# Patient Record
Sex: Male | Born: 1955 | Race: Black or African American | Hispanic: No | Marital: Single | Smoking: Current every day smoker
Health system: Southern US, Community
[De-identification: ages and names within clinical notes are randomized; demographics above are authoritative.]

## PROBLEM LIST (undated history)

## (undated) DIAGNOSIS — H269 Unspecified cataract: Secondary | ICD-10-CM

## (undated) DIAGNOSIS — F32A Depression, unspecified: Secondary | ICD-10-CM

## (undated) DIAGNOSIS — K219 Gastro-esophageal reflux disease without esophagitis: Secondary | ICD-10-CM

## (undated) DIAGNOSIS — M810 Age-related osteoporosis without current pathological fracture: Secondary | ICD-10-CM

## (undated) DIAGNOSIS — C801 Malignant (primary) neoplasm, unspecified: Secondary | ICD-10-CM

## (undated) DIAGNOSIS — Z9151 Personal history of suicidal behavior: Secondary | ICD-10-CM

## (undated) DIAGNOSIS — M1990 Unspecified osteoarthritis, unspecified site: Secondary | ICD-10-CM

## (undated) DIAGNOSIS — M792 Neuralgia and neuritis, unspecified: Secondary | ICD-10-CM

## (undated) DIAGNOSIS — I10 Essential (primary) hypertension: Secondary | ICD-10-CM

## (undated) DIAGNOSIS — Z915 Personal history of self-harm: Secondary | ICD-10-CM

## (undated) DIAGNOSIS — F329 Major depressive disorder, single episode, unspecified: Secondary | ICD-10-CM

## (undated) DIAGNOSIS — F419 Anxiety disorder, unspecified: Secondary | ICD-10-CM

## (undated) DIAGNOSIS — E7800 Pure hypercholesterolemia, unspecified: Secondary | ICD-10-CM

## (undated) HISTORY — PX: COLONOSCOPY: SHX174

## (undated) HISTORY — DX: Anxiety disorder, unspecified: F41.9

## (undated) HISTORY — PX: NEPHRECTOMY: SHX65

## (undated) HISTORY — DX: Unspecified cataract: H26.9

## (undated) HISTORY — DX: Age-related osteoporosis without current pathological fracture: M81.0

---

## 2008-05-03 ENCOUNTER — Ambulatory Visit: Payer: Self-pay | Admitting: Cardiology

## 2008-05-03 ENCOUNTER — Inpatient Hospital Stay (HOSPITAL_COMMUNITY): Admission: EM | Admit: 2008-05-03 | Discharge: 2008-05-04 | Payer: Self-pay | Admitting: Family Medicine

## 2008-06-05 DIAGNOSIS — I10 Essential (primary) hypertension: Secondary | ICD-10-CM | POA: Insufficient documentation

## 2008-06-05 DIAGNOSIS — F314 Bipolar disorder, current episode depressed, severe, without psychotic features: Secondary | ICD-10-CM | POA: Insufficient documentation

## 2008-06-05 DIAGNOSIS — R079 Chest pain, unspecified: Secondary | ICD-10-CM

## 2008-06-05 DIAGNOSIS — C649 Malignant neoplasm of unspecified kidney, except renal pelvis: Secondary | ICD-10-CM | POA: Insufficient documentation

## 2008-06-05 DIAGNOSIS — F329 Major depressive disorder, single episode, unspecified: Secondary | ICD-10-CM

## 2008-06-05 DIAGNOSIS — F3130 Bipolar disorder, current episode depressed, mild or moderate severity, unspecified: Secondary | ICD-10-CM | POA: Insufficient documentation

## 2008-06-05 DIAGNOSIS — E785 Hyperlipidemia, unspecified: Secondary | ICD-10-CM | POA: Insufficient documentation

## 2008-06-05 HISTORY — DX: Malignant neoplasm of unspecified kidney, except renal pelvis: C64.9

## 2010-05-28 LAB — POCT CARDIAC MARKERS
CKMB, poc: 1.5 ng/mL (ref 1.0–8.0)
CKMB, poc: <1 ng/mL — ABNORMAL LOW (ref 1.0–8.0)
Myoglobin, poc: 79.8 ng/mL (ref 12–200)

## 2010-05-28 LAB — COMPREHENSIVE METABOLIC PANEL
ALT: 21 U/L (ref 0–53)
AST: 22 U/L (ref 0–37)
Albumin: 3.3 g/dL — ABNORMAL LOW (ref 3.5–5.2)
Alkaline Phosphatase: 48 U/L (ref 39–117)
Calcium: 8.6 mg/dL (ref 8.4–10.5)
GFR calc Af Amer: >60 mL/min (ref >60)
Glucose, Bld: 121 mg/dL — ABNORMAL HIGH (ref 70–99)
Potassium: 3.7 mEq/L (ref 3.5–5.1)
Sodium: 136 mEq/L (ref 135–145)
Total Protein: 5.8 g/dL — ABNORMAL LOW (ref 6.0–8.3)

## 2010-05-28 LAB — CK TOTAL AND CKMB (NOT AT ARMC)
CK, MB: 1.8 ng/mL (ref 0.3–4.0)
Relative Index: 0.6 (ref 0.0–2.5)
Total CK: 302 U/L — ABNORMAL HIGH (ref 7–232)

## 2010-05-28 LAB — DIFFERENTIAL
Basophils Absolute: 0 10*3/uL (ref 0.0–0.1)
Basophils Relative: 1 % (ref 0–1)
Eosinophils Absolute: 0.1 10*3/uL (ref 0.0–0.7)
Monocytes Absolute: 0.5 10*3/uL (ref 0.1–1.0)
Neutro Abs: 3.1 10*3/uL (ref 1.7–7.7)
Neutrophils Relative %: 58 % (ref 43–77)

## 2010-05-28 LAB — TROPONIN I: Troponin I: <0.01 ng/mL (ref 0.00–0.06)

## 2010-05-28 LAB — BASIC METABOLIC PANEL
CO2: 29 mEq/L (ref 19–32)
Calcium: 8.9 mg/dL (ref 8.4–10.5)
Creatinine, Ser: 1.12 mg/dL (ref 0.4–1.5)
Glucose, Bld: 85 mg/dL (ref 70–99)

## 2010-05-28 LAB — HEPARIN LEVEL (UNFRACTIONATED): Heparin Unfractionated: 0.85 IU/mL — ABNORMAL HIGH (ref 0.30–0.70)

## 2010-05-28 LAB — CBC
MCHC: 35.3 g/dL (ref 30.0–36.0)
RDW: 14.6 % (ref 11.5–15.5)

## 2010-05-28 LAB — CARDIAC PANEL(CRET KIN+CKTOT+MB+TROPI): CK, MB: 1.8 ng/mL (ref 0.3–4.0)

## 2010-05-28 LAB — LIPID PANEL
Cholesterol: 127 mg/dL (ref 0–200)
HDL: 27 mg/dL — ABNORMAL LOW (ref >39)
Total CHOL/HDL Ratio: 4.7 RATIO
VLDL: 18 mg/dL (ref 0–40)

## 2010-06-30 NOTE — Discharge Summary (Signed)
[name], [name]            ACCOUNT NO.:  [id number]   MEDICAL RECORD NO.:  [id number]          PATIENT TYPE:  INP   LOCATION:  3712                         FACILITY:  [facility]   PHYSICIAN:  Doylene Canning. Ladona Ridgel, MD    DATE OF BIRTH:  [birth year]   DATE OF ADMISSION:  05/03/2008  DATE OF DISCHARGE:  05/04/2008                               DISCHARGE SUMMARY   PRIMARY CARDIOLOGIST:  It will be new, Madolyn Frieze. Jens Som, MD, [facility]   PRIMARY CARE PHYSICIAN:  The physicians of [facility].   PROCEDURES PERFORMED DURING HOSPITALIZATION:  None.   FINAL DISCHARGE DIAGNOSES:  1. Chest pain.  2. History of reported myocardial infarction in 2007 with cardiac      catheterization in [place] with no intervention per history and      physical.  3. History of renal carcinoma, status post left nephrectomy in 2007 in      [place].  4. Hypertension.  5. Hyperlipidemia.  6. Depression.  7. Posttraumatic stress disorder.   HOSPITAL COURSE:  This is a 55 year old African American male with prior  history of MI in 2007 with a subsequent cardiac catheterization there  with no intervention at that time.  The patient presented to [facility]  Emergency Room with complaints of sudden onset of shortness of breath  with left arm tingling and numbness as well as 9/10 substernal chest  pressure, tightness, and squeezing.  The patient states the symptoms  will worse with walking and better with rest.  As a result of this, the  patient presented to [facility] Emergency Room where he was seen and  examined by Ward Givens, nurse practitioner and Dr. Olga Millers.  The  patient was admitted to rule out myocardial infarction with cardiac  enzymes cycled, if positive the patient will be planned for cardiac  catheterization.  The patient was reluctant to stay, as he did not want  to lose the spot in the homeless shelter.  Social worker was requested  to see the patient to assist with home needs.   It was found that the  [facility]  Prior to admission, they gave  him a call and there will be a bed waiting for him when he returns.   Cardiac enzymes, as stated, were cycled and were found to be negative  x3.  The patient had no further complaints of chest discomfort during  hospitalization.  EKG revealed normal sinus rhythm without evidence of  ischemia or heart block.   The patient was seen and examined by Dr. Lewayne Bunting on day of  discharge, and the patient was anxious to leave.  The patient's blood  pressure and heart rate were stable, and the patient was found to be  able to return home.   DISCHARGE LABORATORY DATA:  Cardiac enzymes negative x3.  Troponin less  than 0.05, less than 0.05, less than 0.01 respectively.  Cholesterol  127, lipids 90, HDL 27, LDL 82.  Sodium 136, potassium 3.7, chloride  103, CO2 of 26, glucose 121, BUN 19, creatinine 1.14.  Hemoglobin A1c  7.2.  TSH 0.684.  Hemoglobin 14.0, hematocrit 39.6, white blood cells  5.4, platelets 168.  Chest x-ray revealing no acute cardiopulmonary  process.  EKG as stated revealing normal sinus rhythm, ventricular rate  of 72 beats per minute.   DISCHARGE MEDICATIONS:  1. Aspirin 81 mg daily.  2. Wellbutrin 150 mg twice a day.  3. Seroquel 300 mg at bedtime.  4. Trazodone 100 mg at bedtime.  5. Augmentin as taken prior to admission for completion of antibiotic      therapy.   FOLLOWUP PLANS AND APPOINTMENT:  The patient will follow up with Dr.  Olga Millers within 1 month, our office will call to make an  appointment or he will call our office, as phone number has been  provided.   Time spent with the patient to include physician time 30 minutes.      Bettey Mare. Lyman Bishop, NP      Doylene Canning. Ladona Ridgel, MD  Electronically Signed    KML/MEDQ  D:  05/04/2008  T:  05/05/2008  Job:  [id number]

## 2010-06-30 NOTE — H&P (Signed)
[name], [name]            ACCOUNT NO.:  [id number]   MEDICAL RECORD NO.:  [id number]          PATIENT TYPE:  INP   LOCATION:  3712                         FACILITY:  [facility]   PHYSICIAN:  Madolyn Frieze. Jens Som, MD, FACCDATE OF BIRTH:  May 31, 1955   DATE OF ADMISSION:  05/03/2008  DATE OF DISCHARGE:                              HISTORY & PHYSICAL   PRIMARY CARDIOLOGIST:  New to [facility], being seen by Dr.  Madolyn Frieze. [place].   PRIMARY CARE Greydis Stlouis:  [facility].   PATIENT PROFILE:  A 55 year old [place] American male with reported  prior history of MI who presents with unstable angina.   PROBLEMS:  1. Unstable angina.      a.     History of reported MI in 2007 with cardiac catheterization       in [place], then no intervention.  2. History of renal carcinoma status post left nephrectomy in 2007      also in [place].  3. Hypertension.  4. Hyperlipidemia.  5. Depression.  6. PTSD.   HISTORY OF PRESENT ILLNESS:  This is a 55 year old African American male  with prior history of light heart attack that was evaluated in  [place] with stress test and subsequent catheterization.  The patient  does not think that he had an intervention.  He was told that his heart  arteries were normal.  The patient is homeless and lives in a shelter  here in [place] and was walking today when he had sudden onset  shortness of breath with left arm tingling and numbness as well as 9/10  substernal chest pressure, tightness, squeezing.  Symptoms were worse  with walking and better with rest.  He had intermittent symptoms  throughout the day and eventually went back to the homeless shelter  where somewhat recommended that he get checked out.  He then walked from  a shelter with recurrent symptoms [place] way [place]  where there was a veteran's office and he was able to get a ride to the  Urgent Care here at [facility] and subsequently was taken to the  [facility]  ED.  He is currently complaining of 4/10 chest discomfort.  He did  receive nitroglycerin earlier and says it did help.   ALLERGIES:  No known drug allergies.   HOME MEDICATIONS:  1. Aspirin 81 mg daily.  2. Wellbutrin 150 mg b.i.d.  3. Seroquel 300 mg nightly.  4. Trazodone 100 mg nightly.  5. Augmentin 500/125 mg b.i.d. (started a couple of days ago for      chest cold.)   FAMILY HISTORY:  Mother is alive and well at 73.  Father is alive and  well at 39.  His brother died of following a [place] spider bite.  He has 2 sisters and another brother who are alive and well.   SOCIAL HISTORY:  He lives in homeless shelter here in [place] at  [facility].  He does not currently work.  He has a 15 pack-year  history of tobacco abuse, currently smoking half-a-pack a day.  Denies  alcohol  or drugs.   REVIEW OF SYSTEMS:  Positive chest pain, dyspnea on exertion.  He has  major depression as well as anxiety.  He recently had upper chest  congestion and cough and he has been started on Augmentin.  He is  otherwise a full code.  Otherwise, all systems reviewed were negative.   PHYSICAL EXAMINATION:  VITAL SIGNS:  Temperature 97.0, heart rate 93,  respirations 20, blood pressure 116/75, pulse ox 99% room air.  GENERAL:  A pleasant African American male in no acute distress.  Awake,  alert, and oriented x3.  HEENT:  Ears normal.  Nares grossly intact, nonfocal.  SKIN:  Warm and dry without lesions or masses.  MUSCULOSKELETAL:  Grossly normal without deformity or effusions.  PSYCHIATRIC:  Normal affect.  NECK:  No bruits, JVD.  LUNGS:  Respirations are regular and unlabored.  Clear to auscultation.  CARDIAC:  Irregularly regular.  S1 and S2.  No S3, S4, or murmurs.  ABDOMEN:  Round, soft, nontender, and nondistended.  Bowel sounds  present x4.  EXTREMITIES:  Warm, dry, and pink.  No clubbing, cyanosis, or edema.  Dorsalis pedis and posterior tibial pulses are 2+ and  equal bilaterally.   DIAGNOSTIC STUDIES:  Chest x-ray shows  no acute cardiopulmonary  process.  EKG shows sinus rhythm at rate 82, normal axis, inferior Qs,  and 1 mm J-point elevation at V2, V3.   LABORATORY DATA:  Hemoglobin 14.0, hematocrit 39.6, WBC 5.4, platelets  168.  Sodium 139, potassium 3.7, chloride 105, CO2 of 29, BUN 18,  creatinine 1.12, glucose 85.  CK MB 1.5.  Troponin-I less than 0.05.   ASSESSMENT AND PLAN:  1. Unstable angina.  The patient has had recurrent exertional angina      and dyspnea with radiation done to the left arm also associated      with left arm numbness, better with rest.  Symptoms have been      recurrent all day today.  Nitro helped here in the ED.  Notably      symptoms are similar to his previous myocardial infarction symptoms      in 2007.  The patient says that time he had a catheterization which      showed normal coronaries.  Plan to admit and cycle cardiac      markers.  We have recommend cardiac catheterization which could be      performed on Monday or sooner if the patient had recurrent      symptoms.  The patient states that he may need to leave [facility]      tomorrow as he is very concerned about losing his spot in the      homeless shelter.  We will ask social work to try and assist with      the situation.  Provided that we do and perform catheterization, we      will hydrate him well as he does have solitary kidney following his      left nephrectomy in 2007.  We will plan to add heparin, nitrate,      beta blocker, statin, and continue aspirin.  2. Hypertension, stable.  He is on no meds at home.  3. Hyperlipidemia.  Check lipids and LFTs.  We will add simvastatin.      He does not take anything at home.  4. Encouraged to continue home meds.  5. Tobacco abuse.  Cessation advised.      Nicolasa Ducking, ANP  Madolyn Frieze Jens Som, MD, [facility]  Electronically Signed    CB/MEDQ  D:  05/03/2008  T:  05/04/2008  Job:  [id number]

## 2011-02-01 ENCOUNTER — Emergency Department (HOSPITAL_COMMUNITY)
Admission: EM | Admit: 2011-02-01 | Discharge: 2011-02-01 | Disposition: A | Discharge status: Home / Self Care | Payer: Non-veteran care | Attending: Emergency Medicine | Admitting: Emergency Medicine

## 2011-02-01 ENCOUNTER — Encounter: Payer: Self-pay | Admitting: *Deleted

## 2011-02-01 ENCOUNTER — Emergency Department (HOSPITAL_COMMUNITY): Payer: Non-veteran care

## 2011-02-01 DIAGNOSIS — E119 Type 2 diabetes mellitus without complications: Secondary | ICD-10-CM | POA: Insufficient documentation

## 2011-02-01 DIAGNOSIS — R29898 Other symptoms and signs involving the musculoskeletal system: Secondary | ICD-10-CM | POA: Insufficient documentation

## 2011-02-01 DIAGNOSIS — M25561 Pain in right knee: Secondary | ICD-10-CM

## 2011-02-01 DIAGNOSIS — I10 Essential (primary) hypertension: Secondary | ICD-10-CM | POA: Insufficient documentation

## 2011-02-01 DIAGNOSIS — M25569 Pain in unspecified knee: Secondary | ICD-10-CM | POA: Insufficient documentation

## 2011-02-01 HISTORY — DX: Unspecified osteoarthritis, unspecified site: M19.90

## 2011-02-01 HISTORY — DX: Gastro-esophageal reflux disease without esophagitis: K21.9

## 2011-02-01 HISTORY — DX: Essential (primary) hypertension: I10

## 2011-02-01 HISTORY — DX: Neuralgia and neuritis, unspecified: M79.2

## 2011-02-01 HISTORY — DX: Major depressive disorder, single episode, unspecified: F32.9

## 2011-02-01 HISTORY — DX: Pure hypercholesterolemia, unspecified: E78.00

## 2011-02-01 HISTORY — DX: Depression, unspecified: F32.A

## 2011-02-01 MED ORDER — TRAMADOL HCL 50 MG PO TABS: 50.0000 mg | ORAL_TABLET | Freq: Once | ORAL | Status: DC

## 2011-02-01 MED ORDER — IBUPROFEN 400 MG PO TABS: 400.0000 mg | ORAL_TABLET | Freq: Four times a day (QID) | ORAL | Status: AC | PRN

## 2011-02-01 MED ORDER — HYDROCODONE-ACETAMINOPHEN 5-325 MG PO TABS
1.0000 | ORAL_TABLET | Freq: Once | ORAL | Status: DC
  Filled 2011-02-01: qty 1

## 2011-02-01 MED ORDER — IBUPROFEN 800 MG PO TABS
800.0000 mg | ORAL_TABLET | Freq: Once | ORAL | Status: AC
  Administered 2011-02-01: 800 mg via ORAL
  Filled 2011-02-01: qty 1

## 2011-02-01 NOTE — ED Provider Notes (Signed)
History     CSN: [id number] Arrival date & time: 02/01/2011  7:27 PM   First MD Initiated Contact with Patient 02/01/11 2107      Chief Complaint  Patient presents with  . Extremity Weakness    pt c/o right leg weakness that started at 330. states was on crutches last year for some reason. reports "knee went out." states felt a pain in knee and "it popped." then reports unable to walk on it.      Patient is a 55 y.o. male presenting with knee pain. The history is provided by the patient and the spouse.  Knee Pain This is a recurrent problem. The current episode started today. The problem occurs constantly.  Knee Pain This is a recurrent problem. The current episode started today. The problem occurs constantly.  Reports [facility] in a [facility] and today while walking he felt a pop in his (R) knee then had a sharp pain in the knee. Pain has persisted since this event at approx 1530 today. KNee is also swollen. Staff at the center recommedned he come get it checked.  Past Medical History  Diagnosis Date  . Arthritis   . Diabetes mellitus   . Hypertension   . Depression   . High cholesterol   . GERD (gastroesophageal reflux disease)   . Neuropathic pain     History reviewed. No pertinent past surgical history.  History reviewed. No pertinent family history.  History  Substance Use Topics  . Smoking status: Current Everyday Smoker    Types: Cigarettes  . Smokeless tobacco: Not on file  . Alcohol Use: No      Review of Systems  Constitutional: Negative.   HENT: Negative.   Eyes: Negative.   Respiratory: Negative.   Cardiovascular: Negative.   Gastrointestinal: Negative.   Genitourinary: Negative.   Musculoskeletal: Positive for extremity weakness.  Skin: Negative.   Neurological: Negative.   Hematological: Negative.   Psychiatric/Behavioral: Negative.     Allergies  Morphine and related  Home Medications   Current Outpatient Rx  Name Route  Sig Dispense Refill  . ARIPIPRAZOLE 10 MG PO TABS Oral Take 5 mg by mouth daily.      Marland Kitchen GABAPENTIN 100 MG PO CAPS Oral Take 100 mg by mouth at bedtime.      . GUAIFENESIN 400 MG PO TABS Oral Take 400 mg by mouth every 6 (six) hours as needed. For cough and congestion     . LANCET DEVICES MISC Does not apply by Does not apply route.      Marland Kitchen LISINOPRIL 20 MG PO TABS Oral Take 10 mg by mouth daily.      . MELOXICAM 15 MG PO TABS Oral Take 15 mg by mouth daily.      Marland Kitchen MENTHOL 3 MG MT LOZG Oral Take 1 lozenge by mouth as needed. For sore thoat     . METFORMIN HCL 500 MG PO TABS Oral Take 500 mg by mouth 2 (two) times daily with a meal.      . METOPROLOL TARTRATE 50 MG PO TABS Oral Take 25 mg by mouth 2 (two) times daily.      Marland Kitchen OMEPRAZOLE MAGNESIUM 20 MG PO TBEC Oral Take 20 mg by mouth daily before breakfast.      . POLYVINYL ALCOHOL 1.4 % OP SOLN Both Eyes Place 1 drop into both eyes as needed. For dry eyes     . SERTRALINE HCL 100 MG PO  TABS Oral Take 100 mg by mouth daily.      Marland Kitchen SIMVASTATIN 80 MG PO TABS Oral Take 40 mg by mouth at bedtime.        BP 133/83  Pulse 78  Temp(Src) 98.3 F (36.8 C) (Oral)  Resp 20  SpO2 98%  Physical Exam  Constitutional: He appears well-developed and well-nourished.  HENT:  Head: Normocephalic and atraumatic.  Eyes: Conjunctivae are normal.  Neck: Neck supple.  Cardiovascular: Normal rate and regular rhythm.   Pulmonary/Chest: Effort normal and breath sounds normal.  Abdominal: Soft. Bowel sounds are normal.  Musculoskeletal: Normal range of motion.       Right knee: He exhibits swelling and effusion. He exhibits no erythema.       Legs: Neurological: He is alert.  Skin: Skin is warm and dry.  Psychiatric: He has a normal mood and affect.    ED Course  Procedures Findings and plan discussed w/ pt. Will apply knee immobilizer. States he thinks he can get around ok w/ his cane and declines crutches. Pt agreeable w/ plan  Labs Reviewed - No data  to display No results found.   No diagnosis found.    MDM  Knee pain. No acute injury identified. Swelling noted.   Medical screening examination/treatment/procedure(s) were performed by non-physician practitioner and as supervising physician I was immediately available for consultation/collaboration. Osvaldo Human, M.D.     Leanne Chang, NP 02/01/11 2329  Roma Kayser Schorr, NP 02/01/11 4098  Carleene Cooper III, MD 02/02/11 1058

## 2011-06-16 ENCOUNTER — Encounter (HOSPITAL_COMMUNITY): Payer: Self-pay | Admitting: Family Medicine

## 2011-06-16 ENCOUNTER — Emergency Department (HOSPITAL_COMMUNITY)
Admission: EM | Admit: 2011-06-16 | Discharge: 2011-06-16 | Disposition: A | Discharge status: Home / Self Care | Payer: Medicaid Other | Attending: Emergency Medicine | Admitting: Emergency Medicine

## 2011-06-16 DIAGNOSIS — R209 Unspecified disturbances of skin sensation: Secondary | ICD-10-CM | POA: Insufficient documentation

## 2011-06-16 DIAGNOSIS — R300 Dysuria: Secondary | ICD-10-CM | POA: Insufficient documentation

## 2011-06-16 DIAGNOSIS — E119 Type 2 diabetes mellitus without complications: Secondary | ICD-10-CM | POA: Insufficient documentation

## 2011-06-16 DIAGNOSIS — I10 Essential (primary) hypertension: Secondary | ICD-10-CM | POA: Insufficient documentation

## 2011-06-16 DIAGNOSIS — M79609 Pain in unspecified limb: Secondary | ICD-10-CM | POA: Insufficient documentation

## 2011-06-16 DIAGNOSIS — F17200 Nicotine dependence, unspecified, uncomplicated: Secondary | ICD-10-CM | POA: Insufficient documentation

## 2011-06-16 DIAGNOSIS — R110 Nausea: Secondary | ICD-10-CM | POA: Insufficient documentation

## 2011-06-16 DIAGNOSIS — N419 Inflammatory disease of prostate, unspecified: Secondary | ICD-10-CM | POA: Insufficient documentation

## 2011-06-16 DIAGNOSIS — E7800 Pure hypercholesterolemia, unspecified: Secondary | ICD-10-CM | POA: Insufficient documentation

## 2011-06-16 DIAGNOSIS — K219 Gastro-esophageal reflux disease without esophagitis: Secondary | ICD-10-CM | POA: Insufficient documentation

## 2011-06-16 DIAGNOSIS — R079 Chest pain, unspecified: Secondary | ICD-10-CM | POA: Insufficient documentation

## 2011-06-16 LAB — OCCULT BLOOD, POC DEVICE: Fecal Occult Bld: NEGATIVE

## 2011-06-16 LAB — URINALYSIS, ROUTINE W REFLEX MICROSCOPIC
Bilirubin Urine: NEGATIVE
Glucose, UA: NEGATIVE mg/dL
Ketones, ur: NEGATIVE mg/dL
pH: 5.5 (ref 5.0–8.0)

## 2011-06-16 LAB — DIFFERENTIAL
Eosinophils Absolute: 0 10*3/uL (ref 0.0–0.7)
Lymphs Abs: 1.6 10*3/uL (ref 0.7–4.0)
Monocytes Relative: 8 % (ref 3–12)
Neutrophils Relative %: 73 % (ref 43–77)

## 2011-06-16 LAB — CBC
Hemoglobin: 15 g/dL (ref 13.0–17.0)
MCH: 28.3 pg (ref 26.0–34.0)
MCV: 83.4 fL (ref 78.0–100.0)
RBC: 5.3 MIL/uL (ref 4.22–5.81)

## 2011-06-16 LAB — GLUCOSE, CAPILLARY

## 2011-06-16 LAB — URINE MICROSCOPIC-ADD ON

## 2011-06-16 LAB — BASIC METABOLIC PANEL
BUN: 17 mg/dL (ref 6–23)
Calcium: 9.4 mg/dL (ref 8.4–10.5)
Creatinine, Ser: 1.1 mg/dL (ref 0.50–1.35)
GFR calc Af Amer: 86 mL/min — ABNORMAL LOW (ref >90)
GFR calc non Af Amer: 74 mL/min — ABNORMAL LOW (ref >90)
Potassium: 3.6 mEq/L (ref 3.5–5.1)

## 2011-06-16 MED ORDER — SULFAMETHOXAZOLE-TRIMETHOPRIM 800-160 MG PO TABS: 1.0000 | ORAL_TABLET | Freq: Two times a day (BID) | ORAL | Status: AC

## 2011-06-16 MED ORDER — SULFAMETHOXAZOLE-TMP DS 800-160 MG PO TABS
1.0000 | ORAL_TABLET | Freq: Once | ORAL | Status: AC
  Administered 2011-06-16: 1 via ORAL
  Filled 2011-06-16: qty 1

## 2011-06-16 MED ORDER — TRAMADOL HCL 50 MG PO TABS: 50.0000 mg | ORAL_TABLET | Freq: Four times a day (QID) | ORAL | Status: AC | PRN

## 2011-06-16 MED ORDER — TRAMADOL HCL 50 MG PO TABS
50.0000 mg | ORAL_TABLET | Freq: Once | ORAL | Status: AC
  Administered 2011-06-16: 50 mg via ORAL
  Filled 2011-06-16: qty 1

## 2011-06-16 NOTE — ED Notes (Signed)
sts dysuria and urinary retention. sts hurts when he walks. Denies hematuria

## 2011-06-16 NOTE — Discharge Instructions (Signed)
FOLLOW UP WITH THE [facility] FOR RECHECK IN ONE WEEK. TAKE MEDICATIONS AS PRESCRIBED. RETURN HERE WITH ANY WORSENING SYMPTOMS OR NEW CONCERNS. PUSH FLUIDS.   Prostatitis Prostatitis is an infection or injury of the prostate gland. This gland is located at the base of the bladder. Prostatitis is usually caused by the same bacteria that cause kidney and bladder infections. It can also be due to bacteria picked up by sexual contact. Symptoms of this condition include: chills, fever, low back or groin pain. There may also be an urge to urinate frequently with difficulty emptying the bladder. Treatment of acute prostatitis includes:  Antibiotic drugs for 2 to 4 weeks. Pain medicine may also be needed.   Rest for 2 to 4 days and try warm sitz baths several times daily.   Increased oral fluids. Avoid coffee, alcohol, and spicy foods which irritate the urethra.  Prostatitis can become a chronic infection if it is not treated properly. You should avoid all strenuous activity or long car rides until all your symptoms are better. Please see your caregiver as suggested for follow up care. Follow-up after treatment for prostatitis is important to be sure that the infection is completely gone. SEEK IMMEDIATE MEDICAL CARE IF: You develop shaking chills, a fever greater than 102 F (38.9 C), blood in the urine or urinary blockage. Document Released: 03/11/2004 Document Revised: 01/21/2011 Document Reviewed: 08/20/2008 [facility] Patient Information 2012 [place], [place].

## 2011-06-16 NOTE — ED Notes (Signed)
Pt now complaining of left arm numbness and HR is elevated. EKG being performed in stretcher triage

## 2011-06-16 NOTE — ED Notes (Signed)
Pt reports going to his PCP at the [facility] yesterday.  Reports having UTI symptoms that started yesterday 2 hours after leaving the [place].  Reports penile discharge (hx of STDs).  Also reports having dysuria and lower abdominal pain.  Pt also reporting (L) arm numbness and tingling.  Pt had an xray yesterday and has been referred to physical therapy.  States that he was supposed to have 2 cardiac stents placed in 2010 but refused treatment and was questioning if his blockages could be better without the stents.    Pt came to the ED today for eval of possible UTI.

## 2011-06-16 NOTE — ED Notes (Signed)
Pt reports having chest pain and left arm numbness, stated he was walking into the ED when it started. Pt stated the chest pain stopped once he sat down and denies current chest pain. Pt reports that the left arm numbness has been on going for 3 weeks. Pt stated [facility] and reported the same symptoms.

## 2011-06-16 NOTE — ED Provider Notes (Signed)
History     CSN: [id number]  Arrival date & time 06/16/11  4696   First MD Initiated Contact with Patient 06/16/11 [phone]      Chief Complaint  Patient presents with  . Urinary Tract Infection    (Consider location/radiation/quality/duration/timing/severity/associated sxs/prior treatment) Patient is a 56 y.o. male presenting with urinary tract infection and extremity pain. The history is provided by the patient.  Urinary Tract Infection This is a new problem. The current episode started in the past 7 days. The problem occurs constantly. The problem has been gradually worsening. Associated symptoms include chest pain, nausea and numbness. Pertinent negatives include no abdominal pain, coughing, fever or vomiting. Associated symptoms comments: He complains of painful urination and difficulty starting a stream of urination. No hematuria seen. No fever. He has had mild nausea without vomiting. He denies recent sexual activity. .  Extremity Pain This is a chronic problem. The current episode started more than 1 month ago. The problem occurs constantly. The problem has been unchanged. Associated symptoms include chest pain, nausea and numbness. Pertinent negatives include no abdominal pain, coughing, fever or vomiting. Associated symptoms comments: Left arm numbness and pain. He reports this is chronic and being evaluated by his doctor. He states that he has experienced on and off chest discomfort - having no pain currently. No SOB, cough or fever. He states that he was told he needed a catheterization in 2010 but declined that procedure. .    Past Medical History  Diagnosis Date  . Arthritis   . Diabetes mellitus   . Hypertension   . Depression   . High cholesterol   . GERD (gastroesophageal reflux disease)   . Neuropathic pain     History reviewed. No pertinent past surgical history.  History reviewed. No pertinent family history.  History  Substance Use Topics  . Smoking status:  Current Everyday Smoker    Types: Cigarettes  . Smokeless tobacco: Not on file  . Alcohol Use: No      Review of Systems  Constitutional: Negative.  Negative for fever and unexpected weight change.  Respiratory: Negative.  Negative for cough and shortness of breath.   Cardiovascular: Positive for chest pain. Negative for leg swelling.  Gastrointestinal: Positive for nausea. Negative for vomiting and abdominal pain.  Genitourinary: Positive for dysuria.  Musculoskeletal:       See HPI.  Neurological: Positive for numbness.    Allergies  Morphine and related  Home Medications   Current Outpatient Rx  Name Route Sig Dispense Refill  . GABAPENTIN 100 MG PO CAPS Oral Take 100 mg by mouth at bedtime.      . GUAIFENESIN 400 MG PO TABS Oral Take 400 mg by mouth every 6 (six) hours as needed. For cough and congestion     . LISINOPRIL 20 MG PO TABS Oral Take 10 mg by mouth daily.      . MELOXICAM 15 MG PO TABS Oral Take 15 mg by mouth daily.      Marland Kitchen MENTHOL 3 MG MT LOZG Oral Take 1 lozenge by mouth as needed. For sore thoat     . METFORMIN HCL 500 MG PO TABS Oral Take 500 mg by mouth 2 (two) times daily with a meal. Lunch and dinner    . METOPROLOL TARTRATE 50 MG PO TABS Oral Take 25 mg by mouth 2 (two) times daily.      Marland Kitchen MIRTAZAPINE 45 MG PO TABS Oral Take 45 mg by mouth at bedtime.    Marland Kitchen  OMEPRAZOLE MAGNESIUM 20 MG PO TBEC Oral Take 20 mg by mouth daily before breakfast.      . POLYVINYL ALCOHOL 1.4 % OP SOLN Both Eyes Place 1 drop into both eyes as needed. For dry eyes     . SIMVASTATIN 80 MG PO TABS Oral Take 40 mg by mouth at bedtime.      Marland Kitchen LANCET DEVICES MISC Does not apply by Does not apply route.        BP 122/75  Pulse 104  Temp(Src) 98.5 F (36.9 C) (Oral)  Resp 19  SpO2 97%  Physical Exam  Constitutional: He appears well-developed and well-nourished.  HENT:  Head: Normocephalic.  Neck: Normal range of motion. Neck supple.  Cardiovascular: Normal rate and regular  rhythm.   No murmur heard. Pulmonary/Chest: Effort normal and breath sounds normal. He has no wheezes. He has no rales.  Abdominal: Soft. Bowel sounds are normal. There is no tenderness. There is no rebound and no guarding.  Genitourinary:       Uncircumcised penis without discharge. Testicles without swelling, mildly and generally tender. No inguinal adenopathy. Prostate slightly enlarged and tender.   Musculoskeletal: Normal range of motion.  Neurological: He is alert. No cranial nerve deficit.  Skin: Skin is warm and dry. No rash noted.  Psychiatric: He has a normal mood and affect.    ED Course  Procedures (including critical care time)  Labs Reviewed  URINALYSIS, ROUTINE W REFLEX MICROSCOPIC - Abnormal; Notable for the following:    APPearance CLOUDY (*)    Hgb urine dipstick LARGE (*)    Protein, ur 100 (*)    Nitrite POSITIVE (*)    Leukocytes, UA LARGE (*)    All other components within normal limits  BASIC METABOLIC PANEL - Abnormal; Notable for the following:    Glucose, Bld 141 (*)    GFR calc non Af Amer 74 (*)    GFR calc Af Amer 86 (*)    All other components within normal limits  URINE MICROSCOPIC-ADD ON - Abnormal; Notable for the following:    Squamous Epithelial / LPF FEW (*)    Bacteria, UA MANY (*)    All other components within normal limits  GLUCOSE, CAPILLARY - Abnormal; Notable for the following:    Glucose-Capillary 138 (*)    All other components within normal limits  TROPONIN I  CBC  DIFFERENTIAL  OCCULT BLOOD, POC DEVICE  URINE CULTURE  GC/CHLAMYDIA PROBE AMP, GENITAL  OCCULT BLOOD X 1 CARD TO LAB, STOOL   No results found.   No diagnosis found. 1. Prostatitis 2. Chronic extremity pain    MDM  He remains comfortable with exception of c/o continued genitalia discomfort, especially with urination. UTI evident on lab studies. Patient refuses GC swab. Will treat for UTI/prostatitis with septra DS, started here. Given Ultram for discomfort.  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 Will discharge home.  Re: chest discomfort, EKG and troponin negative. Arm numbness/pain chronic and constant. Doubt cardiac origin of symptoms. [facility] [place] for recheck and further outpatient management.        Rodena Medin, PA-C 06/16/11 1314  Rodena Medin, PA-C 06/16/11 1321

## 2011-06-16 NOTE — ED Provider Notes (Signed)
Medical screening examination/treatment/procedure(s) were performed by non-physician practitioner and as supervising physician I was immediately available for consultation/collaboration.   Glynn Octave, MD 06/16/11 1739

## 2011-06-17 ENCOUNTER — Encounter (HOSPITAL_COMMUNITY): Payer: Self-pay | Admitting: Emergency Medicine

## 2011-06-17 ENCOUNTER — Emergency Department (HOSPITAL_COMMUNITY): Payer: Medicaid Other

## 2011-06-17 ENCOUNTER — Emergency Department (HOSPITAL_COMMUNITY)
Admission: EM | Admit: 2011-06-17 | Discharge: 2011-06-17 | Disposition: A | Discharge status: Home / Self Care | Payer: Medicaid Other | Attending: Emergency Medicine | Admitting: Emergency Medicine

## 2011-06-17 DIAGNOSIS — E119 Type 2 diabetes mellitus without complications: Secondary | ICD-10-CM | POA: Insufficient documentation

## 2011-06-17 DIAGNOSIS — R079 Chest pain, unspecified: Secondary | ICD-10-CM | POA: Insufficient documentation

## 2011-06-17 DIAGNOSIS — Z79899 Other long term (current) drug therapy: Secondary | ICD-10-CM | POA: Insufficient documentation

## 2011-06-17 DIAGNOSIS — I10 Essential (primary) hypertension: Secondary | ICD-10-CM | POA: Insufficient documentation

## 2011-06-17 DIAGNOSIS — R209 Unspecified disturbances of skin sensation: Secondary | ICD-10-CM | POA: Insufficient documentation

## 2011-06-17 LAB — COMPREHENSIVE METABOLIC PANEL WITH GFR
ALT: 20 U/L (ref 0–53)
AST: 21 U/L (ref 0–37)
Albumin: 3.6 g/dL (ref 3.5–5.2)
Alkaline Phosphatase: 62 U/L (ref 39–117)
BUN: 18 mg/dL (ref 6–23)
CO2: 22 meq/L (ref 19–32)
Calcium: 8.6 mg/dL (ref 8.4–10.5)
Chloride: 102 meq/L (ref 96–112)
Creatinine, Ser: 1.07 mg/dL (ref 0.50–1.35)
GFR calc Af Amer: 89 mL/min — ABNORMAL LOW
GFR calc non Af Amer: 77 mL/min — ABNORMAL LOW
Glucose, Bld: 256 mg/dL — ABNORMAL HIGH (ref 70–99)
Potassium: 4.1 meq/L (ref 3.5–5.1)
Sodium: 135 meq/L (ref 135–145)
Total Bilirubin: 0.3 mg/dL (ref 0.3–1.2)
Total Protein: 6.9 g/dL (ref 6.0–8.3)

## 2011-06-17 LAB — CBC
HCT: 40.5 % (ref 39.0–52.0)
Hemoglobin: 13.7 g/dL (ref 13.0–17.0)
MCH: 28.1 pg (ref 26.0–34.0)
MCHC: 33.8 g/dL (ref 30.0–36.0)
MCV: 83 fL (ref 78.0–100.0)
Platelets: 186 10*3/uL (ref 150–400)
RBC: 4.88 MIL/uL (ref 4.22–5.81)
RDW: 14 % (ref 11.5–15.5)
WBC: 6.6 10*3/uL (ref 4.0–10.5)

## 2011-06-17 LAB — POCT I-STAT TROPONIN I: Troponin i, poc: 0 ng/mL (ref 0.00–0.08)

## 2011-06-17 LAB — APTT: aPTT: 29 s (ref 24–37)

## 2011-06-17 MED ORDER — SODIUM CHLORIDE 0.9 % IV SOLN
1000.0000 mL | INTRAVENOUS | Status: DC
  Administered 2011-06-17: 1000 mL via INTRAVENOUS

## 2011-06-17 MED ORDER — ASPIRIN 81 MG PO CHEW
324.0000 mg | CHEWABLE_TABLET | Freq: Once | ORAL | Status: AC
  Administered 2011-06-17: 324 mg via ORAL
  Filled 2011-06-17: qty 4

## 2011-06-17 MED ORDER — ASPIRIN 81 MG PO CHEW: 324.0000 mg | CHEWABLE_TABLET | Freq: Every day | ORAL | Status: DC

## 2011-06-17 NOTE — ED Notes (Signed)
Pt co chest pain beginning today, pt states chest pain is in center of his chest and pain is described as sharp, pt has no nausea no shortness of breath. Pt co some dizziness.

## 2011-06-17 NOTE — ED Provider Notes (Signed)
History     CSN: [id number]  Arrival date & time 06/17/11  1243   First MD Initiated Contact with Patient 06/17/11 1256      Chief Complaint  Patient presents with  . Chest Pain    HPI Pt states he had some numbness in his left arm, as well as his hand locking up on him a few times.  Pt states he has also had some episodes of pain in his arm.    Pt has been having this off and on for at least the last month.  Pt was seen in the ED yesterday for similar symptoms.  Pt states he felt like his arm was cramping this am into a claw and he had to use his other hand to relax it.  [facility] and was told that it could be a nerve impingement.  He is scheduled for a stress test of the heart and physical therapy.  Pt states the pain in his chest was on the right side.  It did not radiate.  It was a sharp pain that lasted a few seconds.  No trouble with speech or balance.  Pt states the place that he stays at was worried and they wanted to make sure he did not have a stroke or heart attack. Past Medical History  Diagnosis Date  . Arthritis   . Diabetes mellitus   . Hypertension   . Depression   . High cholesterol   . GERD (gastroesophageal reflux disease)   . Neuropathic pain     History reviewed. No pertinent past surgical history.  No family history on file.  History  Substance Use Topics  . Smoking status: Current Everyday Smoker    Types: Cigarettes  . Smokeless tobacco: Not on file  . Alcohol Use: No      Review of Systems  All other systems reviewed and are negative.    Allergies  Morphine and related  Home Medications   Current Outpatient Rx  Name Route Sig Dispense Refill  . ACETAMINOPHEN 325 MG PO TABS Oral Take 650 mg by mouth every 6 (six) hours as needed.    Marland Kitchen GABAPENTIN 100 MG PO CAPS Oral Take 100 mg by mouth at bedtime.      . GUAIFENESIN 400 MG PO TABS Oral Take 400 mg by mouth every 6 (six) hours as needed. For cough and congestion     .  LISINOPRIL 20 MG PO TABS Oral Take 10 mg by mouth daily.      . MELOXICAM 15 MG PO TABS Oral Take 15 mg by mouth daily.      Marland Kitchen MENTHOL 3 MG MT LOZG Oral Take 1 lozenge by mouth as needed. For sore thoat     . METFORMIN HCL 500 MG PO TABS Oral Take 500 mg by mouth 2 (two) times daily with a meal. Lunch and dinner    . METOPROLOL TARTRATE 50 MG PO TABS Oral Take 25 mg by mouth 2 (two) times daily.      Marland Kitchen MIRTAZAPINE 45 MG PO TABS Oral Take 45 mg by mouth at bedtime.    . OMEPRAZOLE MAGNESIUM 20 MG PO TBEC Oral Take 20 mg by mouth daily before breakfast.      . POLYVINYL ALCOHOL 1.4 % OP SOLN Both Eyes Place 1 drop into both eyes as needed. For dry eyes     . SIMVASTATIN 80 MG PO TABS Oral Take 40 mg by mouth at  bedtime.      . SULFAMETHOXAZOLE-TRIMETHOPRIM 800-160 MG PO TABS Oral Take 1 tablet by mouth every 12 (twelve) hours. 28 tablet 0  . TRAMADOL HCL 50 MG PO TABS Oral Take 1 tablet (50 mg total) by mouth every 6 (six) hours as needed for pain. 15 tablet 0  . LANCET DEVICES MISC Does not apply by Does not apply route.        BP 139/88  Pulse 102  Temp(Src) 97.6 F (36.4 C) (Oral)  Resp 26  SpO2 99%  Physical Exam  Nursing note and vitals reviewed. Constitutional: He is oriented to person, place, and time. He appears well-developed and well-nourished. No distress.  HENT:  Head: Normocephalic and atraumatic.  Right Ear: External ear normal.  Left Ear: External ear normal.  Mouth/Throat: Oropharynx is clear and moist.  Eyes: Conjunctivae are normal. Right eye exhibits no discharge. Left eye exhibits no discharge. No scleral icterus.  Neck: Neck supple. No tracheal deviation present.  Cardiovascular: Normal rate, regular rhythm and intact distal pulses.   Pulmonary/Chest: Effort normal and breath sounds normal. No stridor. No respiratory distress. He has no wheezes. He has no rales.  Abdominal: Soft. Bowel sounds are normal. He exhibits no distension. There is no tenderness. There is  no rebound and no guarding.  Musculoskeletal: He exhibits no edema and no tenderness.  Neurological: He is alert and oriented to person, place, and time. He has normal strength. No cranial nerve deficit ( no gross defecits noted) or sensory deficit. He exhibits normal muscle tone. He displays no seizure activity. Coordination normal.       No pronator drift bilateral upper extrem, able to hold both legs off bed for 5 seconds, sensation intact in all extremities, no visual field cuts, no left or right sided neglect  Skin: Skin is warm and dry. No rash noted.  Psychiatric: He has a normal mood and affect.    ED Course  Procedures (including critical care time)  Rate: 101  Rhythm: sinus tachycardia  QRS Axis: normal  Intervals: normal  ST/T Wave abnormalities: normal  Conduction Disutrbances:none  Narrative Interpretation: Left atrial abnormality, inferior Q waves  Old EKG Reviewed: Unchanged  Labs Reviewed  COMPREHENSIVE METABOLIC PANEL - Abnormal; Notable for the following:    Glucose, Bld 256 (*)    GFR calc non Af Amer 77 (*)    GFR calc Af Amer 89 (*)    All other components within normal limits  CBC  POCT I-STAT TROPONIN I  PROTIME-INR  APTT   Dg Chest 2 View  06/17/2011  *RADIOLOGY REPORT*  Clinical Data: Chest pain  CHEST - 2 VIEW  Comparison: 05/03/2008  Findings: Lungs are clear. No pleural effusion or pneumothorax.  Cardiomediastinal silhouette is within normal limits.  Mild degenerative changes of the visualized thoracolumbar spine.  IMPRESSION: No evidence of acute cardiopulmonary disease.  Original Report Authenticated By: Charline Bills, M.D.   Ct Head Wo Contrast  06/17/2011  *RADIOLOGY REPORT*  Clinical Data: Arm numbness  CT HEAD WITHOUT CONTRAST  Technique:  Contiguous axial images were obtained from the base of the skull through the vertex without contrast.  Comparison: None  Findings: The brain has a normal appearance without evidence for hemorrhage, infarction,  hydrocephalus, or mass lesion.  There is no extra axial fluid collection.  The skull and paranasal sinuses are normal.  IMPRESSION: No acute intracranial abnormalities.  Original Report Authenticated By: Rosealee Albee, M.D.      MDM  The  patient presents with a typical chest pain. I doubt these symptoms are related to a cardiac etiology. They have been ongoing for over a week, he has no EKG changes, and his troponin is normal. Patient is comfortable here in emergent arm and. Doubt pulmonary embolism or pneumonia. His cramping in his left hand sounds to be musculoskeletal in nature and I doubt there is any sign of acute stroke.  The patient has plans for an outpatient stress test as ordered by his primary Dr. I feel that it is reasonable for him to continue with this outpatient plan to        Celene Kras, MD 06/17/11 1516

## 2011-06-17 NOTE — Discharge Instructions (Signed)
Chest Pain (Nonspecific) It is often hard to give a specific diagnosis for the cause of chest pain. There is always a chance that your pain could be related to something serious, such as a heart attack or a blood clot in the lungs. You need to follow up with your caregiver for further evaluation. CAUSES   Heartburn.   Pneumonia or bronchitis.   Anxiety or stress.   Inflammation around your heart (pericarditis) or lung (pleuritis or pleurisy).   A blood clot in the lung.   A collapsed lung (pneumothorax). It can develop suddenly on its own (spontaneous pneumothorax) or from injury (trauma) to the chest.   Shingles infection (herpes zoster virus).  The chest wall is composed of bones, muscles, and cartilage. Any of these can be the source of the pain.  The bones can be bruised by injury.   The muscles or cartilage can be strained by coughing or overwork.   The cartilage can be affected by inflammation and become sore (costochondritis).  DIAGNOSIS  Lab tests or other studies, such as X-rays, electrocardiography, stress testing, or cardiac imaging, may be needed to find the cause of your pain.  TREATMENT   Treatment depends on what may be causing your chest pain. Treatment may include:   Acid blockers for heartburn.   Anti-inflammatory medicine.   Pain medicine for inflammatory conditions.   Antibiotics if an infection is present.   You may be advised to change lifestyle habits. This includes stopping smoking and avoiding alcohol, caffeine, and chocolate.   You may be advised to keep your head raised (elevated) when sleeping. This reduces the chance of acid going backward from your stomach into your esophagus.   Most of the time, nonspecific chest pain will improve within 2 to 3 days with rest and mild pain medicine.  HOME CARE INSTRUCTIONS   If antibiotics were prescribed, take your antibiotics as directed. Finish them even if you start to feel better.   For the next few  days, avoid physical activities that bring on chest pain. Continue physical activities as directed.   Do not smoke.   Avoid drinking alcohol.   Only take over-the-counter or prescription medicine for pain, discomfort, or fever as directed by your caregiver.   Follow your caregiver's suggestions for further testing if your chest pain does not go away.   Keep any follow-up appointments you made. If you do not go to an appointment, you could develop lasting (chronic) problems with pain. If there is any problem keeping an appointment, you must call to reschedule.  SEEK MEDICAL CARE IF:   You think you are having problems from the medicine you are taking. Read your medicine instructions carefully.   Your chest pain does not go away, even after treatment.   You develop a rash with blisters on your chest.  SEEK IMMEDIATE MEDICAL CARE IF:   You have increased chest pain or pain that spreads to your arm, neck, jaw, back, or abdomen.   You develop shortness of breath, an increasing cough, or you are coughing up blood.   You have severe back or abdominal pain, feel nauseous, or vomit.   You develop severe weakness, fainting, or chills.   You have a fever.  THIS IS AN EMERGENCY. Do not wait to see if the pain will go away. Get medical help at once. Call your local emergency services (911 in [place] Do not drive yourself to the hospital. MAKE SURE YOU:   Understand these instructions.     Will watch your condition.   Will get help right away if you are not doing well or get worse.  Document Released: 11/11/2004 Document Revised: 01/21/2011 Document Reviewed: 09/07/2007 [employer] Patient Information 2012 [employer]. 

## 2011-06-18 LAB — URINE CULTURE

## 2011-06-19 NOTE — ED Notes (Signed)
+  Urine. Patient treated with Septra. Sensitive to same. Per protocol MD. °

## 2011-08-16 ENCOUNTER — Emergency Department (HOSPITAL_COMMUNITY)
Admission: EM | Admit: 2011-08-16 | Discharge: 2011-08-18 | Disposition: A | Discharge status: Other Acute Inpatient Hospital | Payer: Medicaid Other | Attending: Emergency Medicine | Admitting: Emergency Medicine

## 2011-08-16 ENCOUNTER — Encounter (HOSPITAL_COMMUNITY): Payer: Self-pay | Admitting: Emergency Medicine

## 2011-08-16 DIAGNOSIS — F17200 Nicotine dependence, unspecified, uncomplicated: Secondary | ICD-10-CM | POA: Insufficient documentation

## 2011-08-16 DIAGNOSIS — E119 Type 2 diabetes mellitus without complications: Secondary | ICD-10-CM | POA: Insufficient documentation

## 2011-08-16 DIAGNOSIS — E7800 Pure hypercholesterolemia, unspecified: Secondary | ICD-10-CM | POA: Insufficient documentation

## 2011-08-16 DIAGNOSIS — K219 Gastro-esophageal reflux disease without esophagitis: Secondary | ICD-10-CM | POA: Insufficient documentation

## 2011-08-16 DIAGNOSIS — I10 Essential (primary) hypertension: Secondary | ICD-10-CM | POA: Insufficient documentation

## 2011-08-16 DIAGNOSIS — R45851 Suicidal ideations: Secondary | ICD-10-CM | POA: Insufficient documentation

## 2011-08-16 DIAGNOSIS — R739 Hyperglycemia, unspecified: Secondary | ICD-10-CM

## 2011-08-16 DIAGNOSIS — M129 Arthropathy, unspecified: Secondary | ICD-10-CM | POA: Insufficient documentation

## 2011-08-16 LAB — COMPREHENSIVE METABOLIC PANEL
BUN: 18 mg/dL (ref 6–23)
CO2: 21 mEq/L (ref 19–32)
Chloride: 102 mEq/L (ref 96–112)
Creatinine, Ser: 1.11 mg/dL (ref 0.50–1.35)
GFR calc non Af Amer: 74 mL/min — ABNORMAL LOW (ref >90)
Glucose, Bld: 457 mg/dL — ABNORMAL HIGH (ref 70–99)
Total Bilirubin: 0.4 mg/dL (ref 0.3–1.2)

## 2011-08-16 LAB — GLUCOSE, CAPILLARY
Glucose-Capillary: 397 mg/dL — ABNORMAL HIGH (ref 70–99)
Glucose-Capillary: 183 mg/dL — ABNORMAL HIGH (ref 70–99)

## 2011-08-16 LAB — CBC WITH DIFFERENTIAL/PLATELET
HCT: 46 % (ref 39.0–52.0)
Hemoglobin: 15.7 g/dL (ref 13.0–17.0)
Lymphocytes Relative: 26 % (ref 12–46)
MCHC: 34.1 g/dL (ref 30.0–36.0)
Monocytes Absolute: 0.4 10*3/uL (ref 0.1–1.0)
Monocytes Relative: 6 % (ref 3–12)
Neutro Abs: 3.8 10*3/uL (ref 1.7–7.7)
WBC: 5.7 10*3/uL (ref 4.0–10.5)

## 2011-08-16 LAB — POCT I-STAT TROPONIN I: Troponin i, poc: 0 ng/mL (ref 0.00–0.08)

## 2011-08-16 LAB — ETHANOL: Alcohol, Ethyl (B): <11 mg/dL (ref 0–11)

## 2011-08-16 LAB — RAPID URINE DRUG SCREEN, HOSP PERFORMED: Opiates: NOT DETECTED

## 2011-08-16 MED ORDER — ADULT MULTIVITAMIN W/MINERALS CH
1.0000 | ORAL_TABLET | Freq: Every day | ORAL | Status: DC
  Administered 2011-08-16 – 2011-08-18 (×3): 1 via ORAL
  Filled 2011-08-16 (×3): qty 1

## 2011-08-16 MED ORDER — OMEPRAZOLE MAGNESIUM 20 MG PO TBEC
20.0000 mg | DELAYED_RELEASE_TABLET | Freq: Every day | ORAL | Status: DC
  Administered 2011-08-17 – 2011-08-18 (×2): 20 mg via ORAL
  Filled 2011-08-16 (×2): qty 1

## 2011-08-16 MED ORDER — MIRTAZAPINE 45 MG PO TABS
45.0000 mg | ORAL_TABLET | Freq: Every day | ORAL | Status: DC
  Administered 2011-08-16 – 2011-08-17 (×2): 45 mg via ORAL
  Filled 2011-08-16 (×2): qty 1

## 2011-08-16 MED ORDER — ATORVASTATIN CALCIUM 40 MG PO TABS
40.0000 mg | ORAL_TABLET | Freq: Every day | ORAL | Status: DC
  Administered 2011-08-17: 40 mg via ORAL
  Filled 2011-08-16: qty 1

## 2011-08-16 MED ORDER — ONDANSETRON HCL 8 MG PO TABS: 4.0000 mg | ORAL_TABLET | Freq: Three times a day (TID) | ORAL | Status: DC | PRN

## 2011-08-16 MED ORDER — METFORMIN HCL 500 MG PO TABS
500.0000 mg | ORAL_TABLET | Freq: Two times a day (BID) | ORAL | Status: DC
  Administered 2011-08-16 – 2011-08-18 (×5): 500 mg via ORAL
  Filled 2011-08-16 (×5): qty 1

## 2011-08-16 MED ORDER — LISINOPRIL 10 MG PO TABS
10.0000 mg | ORAL_TABLET | Freq: Every day | ORAL | Status: DC
  Administered 2011-08-16 – 2011-08-18 (×3): 10 mg via ORAL
  Filled 2011-08-16 (×3): qty 1

## 2011-08-16 MED ORDER — IBUPROFEN 400 MG PO TABS: 600.0000 mg | ORAL_TABLET | Freq: Three times a day (TID) | ORAL | Status: DC | PRN

## 2011-08-16 MED ORDER — METOPROLOL TARTRATE 25 MG PO TABS
25.0000 mg | ORAL_TABLET | Freq: Once | ORAL | Status: AC
  Administered 2011-08-16: 25 mg via ORAL
  Filled 2011-08-16: qty 1

## 2011-08-16 MED ORDER — METOPROLOL TARTRATE 25 MG PO TABS
25.0000 mg | ORAL_TABLET | Freq: Two times a day (BID) | ORAL | Status: DC
  Administered 2011-08-16 – 2011-08-18 (×5): 25 mg via ORAL
  Filled 2011-08-16 (×5): qty 1

## 2011-08-16 MED ORDER — FOLIC ACID 1 MG PO TABS
1.0000 mg | ORAL_TABLET | Freq: Every day | ORAL | Status: DC
  Administered 2011-08-16 – 2011-08-18 (×3): 1 mg via ORAL
  Filled 2011-08-16 (×3): qty 1

## 2011-08-16 MED ORDER — LORAZEPAM 2 MG/ML IJ SOLN: 1.0000 mg | Freq: Four times a day (QID) | INTRAMUSCULAR | Status: DC | PRN

## 2011-08-16 MED ORDER — VITAMIN B-1 100 MG PO TABS
100.0000 mg | ORAL_TABLET | Freq: Every day | ORAL | Status: DC
  Administered 2011-08-16 – 2011-08-18 (×3): 100 mg via ORAL
  Filled 2011-08-16 (×3): qty 1

## 2011-08-16 MED ORDER — GABAPENTIN 100 MG PO CAPS
100.0000 mg | ORAL_CAPSULE | Freq: Every day | ORAL | Status: DC
  Administered 2011-08-16 – 2011-08-17 (×2): 100 mg via ORAL
  Filled 2011-08-16 (×2): qty 1

## 2011-08-16 MED ORDER — LORAZEPAM 1 MG PO TABS
1.0000 mg | ORAL_TABLET | Freq: Once | ORAL | Status: AC
  Administered 2011-08-16: 1 mg via ORAL
  Filled 2011-08-16: qty 1

## 2011-08-16 MED ORDER — ASPIRIN 81 MG PO CHEW
81.0000 mg | CHEWABLE_TABLET | Freq: Two times a day (BID) | ORAL | Status: DC
  Administered 2011-08-16 – 2011-08-18 (×5): 81 mg via ORAL
  Filled 2011-08-16 (×5): qty 1

## 2011-08-16 MED ORDER — THIAMINE HCL 100 MG/ML IJ SOLN: 100.0000 mg | Freq: Every day | INTRAMUSCULAR | Status: DC

## 2011-08-16 MED ORDER — INSULIN ASPART 100 UNIT/ML ~~LOC~~ SOLN
0.0000 [IU] | Freq: Three times a day (TID) | SUBCUTANEOUS | Status: DC
  Administered 2011-08-16: 2 [IU] via SUBCUTANEOUS
  Administered 2011-08-16 – 2011-08-17 (×2): 3 [IU] via SUBCUTANEOUS
  Administered 2011-08-17: 7 [IU] via SUBCUTANEOUS
  Administered 2011-08-17: 12:00:00 via SUBCUTANEOUS
  Administered 2011-08-18: 5 [IU] via SUBCUTANEOUS
  Administered 2011-08-18: 7 [IU] via SUBCUTANEOUS
  Filled 2011-08-16: qty 1
  Filled 2011-08-16: qty 7
  Filled 2011-08-16 (×5): qty 1

## 2011-08-16 MED ORDER — LORAZEPAM 1 MG PO TABS
0.0000 mg | ORAL_TABLET | Freq: Four times a day (QID) | ORAL | Status: AC
  Administered 2011-08-17 – 2011-08-18 (×2): 1 mg via ORAL
  Filled 2011-08-16 (×2): qty 1

## 2011-08-16 MED ORDER — INSULIN ASPART 100 UNIT/ML ~~LOC~~ SOLN: 5.0000 [IU] | Freq: Once | SUBCUTANEOUS | Status: DC

## 2011-08-16 MED ORDER — LORAZEPAM 1 MG PO TABS
0.0000 mg | ORAL_TABLET | Freq: Two times a day (BID) | ORAL | Status: DC
  Administered 2011-08-18: 1 mg via ORAL
  Filled 2011-08-16 (×2): qty 1

## 2011-08-16 MED ORDER — ACETAMINOPHEN 325 MG PO TABS: 650.0000 mg | ORAL_TABLET | ORAL | Status: DC | PRN

## 2011-08-16 MED ORDER — LORAZEPAM 1 MG PO TABS
1.0000 mg | ORAL_TABLET | Freq: Four times a day (QID) | ORAL | Status: DC | PRN
  Administered 2011-08-17 – 2011-08-18 (×2): 1 mg via ORAL
  Filled 2011-08-16: qty 1

## 2011-08-16 NOTE — ED Notes (Signed)
Pt has c/o SI for the past week. States he has hx of depression and has attempted to harm himself in the past. States he takes medications for depression which he gets from his MD at the [facility]. Pt states he is currently homeless and the thoughts of harming himself have "begining to get out of hand". States he did crack/cocaine yesterday.

## 2011-08-16 NOTE — ED Notes (Signed)
CBG 397 
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
## 2011-08-16 NOTE — ED Provider Notes (Signed)
Patient resting quietly this shift, he did not want to go to the [place] but there is no bed for him available at behavioral health [facility] and other facilities have tonight the patient as well so he is back to waiting for the [place] to accept him. [id number]  Hurman Horn, MD 08/16/11 [phone]

## 2011-08-16 NOTE — ED Provider Notes (Signed)
History     CSN: [id number]  Arrival date & time 08/16/11  4098   First MD Initiated Contact with Patient 08/16/11 [phone]      Chief Complaint  Patient presents with  . Suicidal  . Medical Clearance    (Consider location/radiation/quality/duration/timing/severity/associated sxs/prior treatment) HPI Comments: C/o "suicidal ideation" for the past 2 days.  History of depression, on remeron.  States recently relapsed on alcohol and cocaine which is making him depressed. Last use of both yesterday.  Does not drink daily.  Uses cocaine daily.  Denies HI.  No hallucinations but lots of "negative thoughts".  Denies chest pain, abdominal pain, nausea, vomiting, headache.  Has plan to walk into traffic.  The history is provided by the patient.    Past Medical History  Diagnosis Date  . Arthritis   . Diabetes mellitus   . Hypertension   . Depression   . High cholesterol   . GERD (gastroesophageal reflux disease)   . Neuropathic pain     History reviewed. No pertinent past surgical history.  No family history on file.  History  Substance Use Topics  . Smoking status: Current Everyday Smoker    Types: Cigarettes  . Smokeless tobacco: Not on file  . Alcohol Use: 1.8 oz/week    3 Cans of beer per week      Review of Systems  Constitutional: Negative for fever and activity change.  HENT: Negative for congestion and rhinorrhea.   Respiratory: Negative for cough, chest tightness and shortness of breath.   Cardiovascular: Negative for chest pain.  Gastrointestinal: Negative for nausea, vomiting and abdominal pain.  Genitourinary: Negative for dysuria and hematuria.  Musculoskeletal: Negative for back pain.  Skin: Negative for rash.  Neurological: Negative for dizziness, weakness and headaches.  Psychiatric/Behavioral: Positive for suicidal ideas, disturbed wake/sleep cycle, self-injury and dysphoric mood. The patient is nervous/anxious.     Allergies  Morphine and  related  Home Medications   Current Outpatient Rx  Name Route Sig Dispense Refill  . ACETAMINOPHEN 325 MG PO TABS Oral Take 650 mg by mouth every 6 (six) hours as needed. For pain    . ASPIRIN 81 MG PO CHEW Oral Chew 81 mg by mouth 2 (two) times daily.    Marland Kitchen GABAPENTIN 100 MG PO CAPS Oral Take 100 mg by mouth at bedtime.      Marland Kitchen LANCET DEVICES MISC Does not apply by Does not apply route.      Marland Kitchen LISINOPRIL 20 MG PO TABS Oral Take 10 mg by mouth daily.      . MELOXICAM 15 MG PO TABS Oral Take 15 mg by mouth daily.      Marland Kitchen METFORMIN HCL 500 MG PO TABS Oral Take 500 mg by mouth 2 (two) times daily with a meal. Lunch and dinner    . METOPROLOL TARTRATE 50 MG PO TABS Oral Take 25 mg by mouth 2 (two) times daily.      Marland Kitchen MIRTAZAPINE 45 MG PO TABS Oral Take 45 mg by mouth at bedtime.    . OMEPRAZOLE MAGNESIUM 20 MG PO TBEC Oral Take 20 mg by mouth daily before breakfast.      . SIMVASTATIN 80 MG PO TABS Oral Take 40 mg by mouth at bedtime.        BP 113/74  Pulse 78  Temp 98 F (36.7 C) (Oral)  Resp 18  SpO2 97%  Physical Exam  Constitutional: He is oriented to person, place, and time. He appears  well-developed and well-nourished. No distress.  HENT:  Head: Normocephalic and atraumatic.  Mouth/Throat: Oropharynx is clear and moist. No oropharyngeal exudate.  Eyes: Conjunctivae and EOM are normal. Pupils are equal, round, and reactive to light.  Neck: Normal range of motion. Neck supple.  Cardiovascular: Regular rhythm and normal heart sounds.   No murmur heard.      tachycardia  Pulmonary/Chest: Effort normal and breath sounds normal. No respiratory distress.  Abdominal: Soft. There is no tenderness. There is no rebound and no guarding.  Musculoskeletal: Normal range of motion. He exhibits no edema and no tenderness.  Neurological: He is alert and oriented to person, place, and time. No cranial nerve deficit.  Skin: Skin is warm.    ED Course  Procedures (including critical care  time)  Labs Reviewed  COMPREHENSIVE METABOLIC PANEL - Abnormal; Notable for the following:    Glucose, Bld 457 (*)     GFR calc non Af Amer 74 (*)     GFR calc Af Amer 85 (*)     All other components within normal limits  URINE RAPID DRUG SCREEN (HOSP PERFORMED) - Abnormal; Notable for the following:    Cocaine POSITIVE (*)     Tetrahydrocannabinol POSITIVE (*)     All other components within normal limits  GLUCOSE, CAPILLARY - Abnormal; Notable for the following:    Glucose-Capillary 397 (*)     All other components within normal limits  GLUCOSE, CAPILLARY - Abnormal; Notable for the following:    Glucose-Capillary 235 (*)     All other components within normal limits  GLUCOSE, CAPILLARY - Abnormal; Notable for the following:    Glucose-Capillary 183 (*)     All other components within normal limits  CBC WITH DIFFERENTIAL  ETHANOL  POCT I-STAT TROPONIN I   No results found.   No diagnosis found.    MDM  Polysubstance abuse, suicidal ideation with plan. Tachycardia on exam without associated symptoms. Last use of cocaine and alcohol yesterday.  Screening labs, EKG Patient reports being off his meds x 1 week, including his beta blocker which may be contributing to his tachycardia.  Denies CP or SOB.  HR improved to 93 with ativan and home meds. Hyperglycemia without DKA.  Medically clear for psychiatry assessment.   Date: 08/16/2011  Rate: 117  Rhythm: sinus tachycardia  QRS Axis: normal  Intervals: normal  ST/T Wave abnormalities: normal  Conduction Disutrbances:none  Narrative Interpretation: T wave inversion III unchanged.  Old EKG Reviewed: unchanged         Glynn Octave, MD 08/16/11 [phone]

## 2011-08-16 NOTE — ED Notes (Signed)
Kristen, ACT Team at bedside  

## 2011-08-16 NOTE — ED Notes (Signed)
Pt wanded by security and house supervisor notified pt to be a 1:1

## 2011-08-17 LAB — GLUCOSE, CAPILLARY
Glucose-Capillary: 229 mg/dL — ABNORMAL HIGH (ref 70–99)
Glucose-Capillary: 164 mg/dL — ABNORMAL HIGH (ref 70–99)

## 2011-08-17 NOTE — ED Notes (Signed)
PT. REQUESTED ATIVAN BEFORE GOING TO BED - MILD HAND TREMORS.

## 2011-08-17 NOTE — BH Assessment (Signed)
[name]   This clinician did talk to patient about his feelings of suicidality.  He does continue to endorse those feelings but also said that he did not want to go to the [place] [facility] in [place].  This clinician told him he would try other hospitals but that we would [facility] if [facility] are [facility].  Clinician called [place], [place] and they are full.  Patient material was sent to [facility] for review also.  Clinician did also assist Dr. Fonnie Jarvis with IVC paperwork for patient.  All material was also sent to the [place] in [place] for their review.
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
## 2011-08-18 LAB — GLUCOSE, CAPILLARY

## 2011-08-18 NOTE — ED Notes (Signed)
Pt in room with sitter at bedside. Breakfast ordered.

## 2011-08-18 NOTE — ED Notes (Signed)
DINNER ORDERED 

## 2011-08-18 NOTE — ED Notes (Signed)
The patient's CBG was 322.

## 2011-08-18 NOTE — ED Notes (Signed)
Lunch ordered 

## 2011-08-18 NOTE — ED Notes (Signed)
Pt resting with eyes closed, overhead light out, sitter at bed side.

## 2011-08-18 NOTE — ED Notes (Signed)
The patient's CBG was 287.

## 2011-08-18 NOTE — ED Notes (Signed)
Pt resting with lights out and eyes closed. Sitter at bed side.

## 2011-08-18 NOTE — BH Assessment (Signed)
[name]      1900:  Pt has been accepted to the [facility] [place] at 1700 by Joey at [phone].  Notified Dr. Preston Fleeting and nursing staff of pending transfer.  Dorathy Daft and Mrs. Pat initiated transfer process and contacted Sheriff's Dept regarding transfer.  Awaiting sheriff's department transfer.  Relayed information to oncoming assessment counselor who will follow up with nursing staff.

## 2011-08-18 NOTE — ED Provider Notes (Signed)
Patient currently sleeping. He is been resting comfortably without complaints. ACT Team is working on placement.  Dione Booze, MD 08/18/11 [phone]

## 2012-03-23 ENCOUNTER — Encounter (HOSPITAL_COMMUNITY): Payer: Self-pay | Admitting: Emergency Medicine

## 2012-03-23 ENCOUNTER — Inpatient Hospital Stay (HOSPITAL_COMMUNITY)
Admission: AD | Admit: 2012-03-23 | Discharge: 2012-03-27 | DRG: 881 | Disposition: A | Discharge status: Home / Self Care | Payer: Medicaid Other | Source: Intra-hospital | Attending: Psychiatry | Admitting: Psychiatry

## 2012-03-23 ENCOUNTER — Emergency Department (HOSPITAL_COMMUNITY)
Admission: EM | Admit: 2012-03-23 | Discharge: 2012-03-23 | Disposition: A | Discharge status: Home / Self Care | Payer: Medicaid Other | Source: Home / Self Care

## 2012-03-23 DIAGNOSIS — R45851 Suicidal ideations: Secondary | ICD-10-CM

## 2012-03-23 DIAGNOSIS — F17200 Nicotine dependence, unspecified, uncomplicated: Secondary | ICD-10-CM | POA: Insufficient documentation

## 2012-03-23 DIAGNOSIS — F22 Delusional disorders: Secondary | ICD-10-CM

## 2012-03-23 DIAGNOSIS — F3289 Other specified depressive episodes: Principal | ICD-10-CM | POA: Diagnosis present

## 2012-03-23 DIAGNOSIS — F329 Major depressive disorder, single episode, unspecified: Principal | ICD-10-CM

## 2012-03-23 DIAGNOSIS — Z79899 Other long term (current) drug therapy: Secondary | ICD-10-CM | POA: Insufficient documentation

## 2012-03-23 DIAGNOSIS — E119 Type 2 diabetes mellitus without complications: Secondary | ICD-10-CM | POA: Diagnosis present

## 2012-03-23 DIAGNOSIS — Z8739 Personal history of other diseases of the musculoskeletal system and connective tissue: Secondary | ICD-10-CM | POA: Insufficient documentation

## 2012-03-23 DIAGNOSIS — E7800 Pure hypercholesterolemia, unspecified: Secondary | ICD-10-CM | POA: Insufficient documentation

## 2012-03-23 DIAGNOSIS — F1910 Other psychoactive substance abuse, uncomplicated: Secondary | ICD-10-CM | POA: Insufficient documentation

## 2012-03-23 DIAGNOSIS — K219 Gastro-esophageal reflux disease without esophagitis: Secondary | ICD-10-CM | POA: Insufficient documentation

## 2012-03-23 DIAGNOSIS — I10 Essential (primary) hypertension: Secondary | ICD-10-CM | POA: Diagnosis present

## 2012-03-23 DIAGNOSIS — E1149 Type 2 diabetes mellitus with other diabetic neurological complication: Secondary | ICD-10-CM | POA: Insufficient documentation

## 2012-03-23 LAB — CBC WITH DIFFERENTIAL/PLATELET
Basophils Relative: 0 % (ref 0–1)
Eosinophils Relative: 1 % (ref 0–5)
Lymphocytes Relative: 37 % (ref 12–46)
Lymphs Abs: 1.7 10*3/uL (ref 0.7–4.0)
Monocytes Absolute: 0.3 10*3/uL (ref 0.1–1.0)
Monocytes Relative: 7 % (ref 3–12)
Neutrophils Relative %: 54 % (ref 43–77)
Platelets: 168 10*3/uL (ref 150–400)
RBC: 5.14 MIL/uL (ref 4.22–5.81)
WBC: 4.7 10*3/uL (ref 4.0–10.5)

## 2012-03-23 LAB — COMPREHENSIVE METABOLIC PANEL
Albumin: 4 g/dL (ref 3.5–5.2)
BUN: 20 mg/dL (ref 6–23)
CO2: 24 mEq/L (ref 19–32)
Calcium: 9.6 mg/dL (ref 8.4–10.5)
Chloride: 108 mEq/L (ref 96–112)
Creatinine, Ser: 0.98 mg/dL (ref 0.50–1.35)
GFR calc non Af Amer: >90 mL/min (ref >90)
Total Bilirubin: 0.3 mg/dL (ref 0.3–1.2)

## 2012-03-23 LAB — RAPID URINE DRUG SCREEN, HOSP PERFORMED
Benzodiazepines: NOT DETECTED
Cocaine: NOT DETECTED
Opiates: NOT DETECTED

## 2012-03-23 MED ORDER — MELOXICAM 15 MG PO TABS
15.0000 mg | ORAL_TABLET | Freq: Every day | ORAL | Status: DC
  Administered 2012-03-24 – 2012-03-25 (×2): 15 mg via ORAL
  Filled 2012-03-23: qty 1
  Filled 2012-03-23: qty 2
  Filled 2012-03-23 (×2): qty 1

## 2012-03-23 MED ORDER — PANTOPRAZOLE SODIUM 40 MG PO TBEC
40.0000 mg | DELAYED_RELEASE_TABLET | Freq: Every day | ORAL | Status: DC
  Administered 2012-03-24 – 2012-03-27 (×4): 40 mg via ORAL
  Filled 2012-03-23 (×6): qty 1

## 2012-03-23 MED ORDER — METOPROLOL TARTRATE 25 MG PO TABS
25.0000 mg | ORAL_TABLET | Freq: Every day | ORAL | Status: DC
  Administered 2012-03-24 – 2012-03-27 (×4): 25 mg via ORAL
  Filled 2012-03-23 (×5): qty 1

## 2012-03-23 MED ORDER — METFORMIN HCL 500 MG PO TABS
1000.0000 mg | ORAL_TABLET | Freq: Two times a day (BID) | ORAL | Status: DC
  Administered 2012-03-24 – 2012-03-27 (×7): 1000 mg via ORAL
  Filled 2012-03-23 (×11): qty 2

## 2012-03-23 MED ORDER — SIMVASTATIN 80 MG PO TABS
80.0000 mg | ORAL_TABLET | Freq: Every day | ORAL | Status: DC
  Administered 2012-03-23 – 2012-03-26 (×4): 80 mg via ORAL
  Filled 2012-03-23 (×4): qty 1
  Filled 2012-03-23: qty 2
  Filled 2012-03-23: qty 1

## 2012-03-23 MED ORDER — TRAZODONE HCL 50 MG PO TABS
50.0000 mg | ORAL_TABLET | Freq: Every day | ORAL | Status: DC
  Administered 2012-03-23 – 2012-03-24 (×2): 50 mg via ORAL
  Filled 2012-03-23 (×4): qty 1

## 2012-03-23 MED ORDER — NICOTINE 21 MG/24HR TD PT24
21.0000 mg | MEDICATED_PATCH | Freq: Once | TRANSDERMAL | Status: DC
  Administered 2012-03-23: 21 mg via TRANSDERMAL
  Filled 2012-03-23: qty 1

## 2012-03-23 MED ORDER — QUETIAPINE FUMARATE 100 MG PO TABS
100.0000 mg | ORAL_TABLET | Freq: Every day | ORAL | Status: DC
  Administered 2012-03-23: 100 mg via ORAL
  Filled 2012-03-23 (×3): qty 1

## 2012-03-23 MED ORDER — LISINOPRIL 10 MG PO TABS
10.0000 mg | ORAL_TABLET | Freq: Every day | ORAL | Status: DC
  Administered 2012-03-24 – 2012-03-27 (×4): 10 mg via ORAL
  Filled 2012-03-23 (×7): qty 1

## 2012-03-23 MED ORDER — ACETAMINOPHEN 325 MG PO TABS: 650.0000 mg | ORAL_TABLET | Freq: Four times a day (QID) | ORAL | Status: DC | PRN

## 2012-03-23 MED ORDER — MIRTAZAPINE 45 MG PO TABS
45.0000 mg | ORAL_TABLET | Freq: Every day | ORAL | Status: DC
  Administered 2012-03-23 – 2012-03-26 (×4): 45 mg via ORAL
  Filled 2012-03-23 (×3): qty 1
  Filled 2012-03-23: qty 3
  Filled 2012-03-23 (×2): qty 1

## 2012-03-23 MED ORDER — GABAPENTIN 100 MG PO CAPS
100.0000 mg | ORAL_CAPSULE | Freq: Every day | ORAL | Status: DC
  Administered 2012-03-23 – 2012-03-24 (×2): 100 mg via ORAL
  Filled 2012-03-23 (×4): qty 1

## 2012-03-23 MED ORDER — QUETIAPINE FUMARATE 50 MG PO TABS: 50.0000 mg | ORAL_TABLET | Freq: Four times a day (QID) | ORAL | Status: DC | PRN

## 2012-03-23 MED ORDER — LAMOTRIGINE 25 MG PO TABS
25.0000 mg | ORAL_TABLET | Freq: Every day | ORAL | Status: DC
  Administered 2012-03-24: 25 mg via ORAL
  Filled 2012-03-23 (×3): qty 1

## 2012-03-23 MED ORDER — MAGNESIUM HYDROXIDE 400 MG/5ML PO SUSP: 30.0000 mL | Freq: Every day | ORAL | Status: DC | PRN

## 2012-03-23 MED ORDER — NICOTINE 21 MG/24HR TD PT24
21.0000 mg | MEDICATED_PATCH | Freq: Every day | TRANSDERMAL | Status: DC
  Administered 2012-03-24 – 2012-03-27 (×4): 21 mg via TRANSDERMAL
  Filled 2012-03-23 (×6): qty 1

## 2012-03-23 MED ORDER — ALUM & MAG HYDROXIDE-SIMETH 200-200-20 MG/5ML PO SUSP: 30.0000 mL | ORAL | Status: DC | PRN

## 2012-03-23 NOTE — Progress Notes (Signed)
Patient ID: [name], male   DOB: [birth year], 56 y.o.   MRN: [id number] Patient admitted voluntarily for depression and thoughts of SI. He is eight months sobers from ETOH/crack and has been living in a group home known as [facility]. Patient reports that he has been experiencing increasing anxiety. He states that the [place] in [place] started his on buspar and seroquel but that his symptoms only worsened. The patient reports increasing agitation such as "throwing a mop bucket around" and seeing shadows in the early morning hours. Denies any SI/HI. He does reports seeing a few shadows today. Patient uses a cane due to visual impairment and hip pain. Patient reports this is his first [facility] admission and was oriented to the facility.

## 2012-03-23 NOTE — ED Provider Notes (Signed)
History     CSN: [id number]  Arrival date & time 03/23/12  1017   First MD Initiated Contact with Patient 03/23/12 1117      No chief complaint on file.   (Consider location/radiation/quality/duration/timing/severity/associated sxs/prior treatment) HPI Comments: Pt is a 57yo male with hx of sucidal ideation, depression, and substance abuse who presents to ED today for medical clearance. He states he has been having mixed thoughts that he might hurt himself, hurt someone else, and feeling paranoid that someone may hurt him. He states he is taking his medication, but still having these feeling, worse this AM. He will not describe a plan for hurting himself.  He denies any recent use of alcohol or drugs. He is currently a resident of [address] for the [facility]. He denies any headache, chest pain, N/V, abdominal pain or recent high fevers.  The history is provided by the patient.    Past Medical History  Diagnosis Date  . Arthritis   . Diabetes mellitus   . Hypertension   . Depression   . High cholesterol   . GERD (gastroesophageal reflux disease)   . Neuropathic pain     History reviewed. No pertinent past surgical history.  No family history on file.  History  Substance Use Topics  . Smoking status: Current Every Day Smoker    Types: Cigarettes  . Smokeless tobacco: Not on file  . Alcohol Use: No     Comment: Pt has been clean for 8 months      Review of Systems  Constitutional: Negative for activity change.       All ROS Neg except as noted in HPI  HENT: Negative for nosebleeds and neck pain.   Eyes: Negative for photophobia and discharge.  Respiratory: Negative for cough, shortness of breath and wheezing.   Cardiovascular: Negative for chest pain and palpitations.  Gastrointestinal: Negative for abdominal pain and blood in stool.  Genitourinary: Negative for dysuria, frequency and hematuria.  Musculoskeletal: Positive for arthralgias. Negative for back pain.   Skin: Negative.   Neurological: Negative for dizziness, seizures and speech difficulty.  Psychiatric/Behavioral: Positive for suicidal ideas. Negative for hallucinations and confusion.       Depression    Allergies  Morphine and related  Home Medications   Current Outpatient Rx  Name  Route  Sig  Dispense  Refill  . GABAPENTIN 100 MG PO CAPS   Oral   Take 100 mg by mouth at bedtime.           Marland Kitchen LAMOTRIGINE 25 MG PO TABS   Oral   Take 25 mg by mouth daily.         Marland Kitchen LISINOPRIL 20 MG PO TABS   Oral   Take 10 mg by mouth daily.           . MELOXICAM 15 MG PO TABS   Oral   Take 15 mg by mouth daily.           Marland Kitchen METFORMIN HCL 1000 MG PO TABS   Oral   Take 1,000 mg by mouth 2 (two) times daily with a meal.         . METOPROLOL TARTRATE 50 MG PO TABS   Oral   Take 25 mg by mouth 2 (two) times daily.         Marland Kitchen MIRTAZAPINE 45 MG PO TABS   Oral   Take 45 mg by mouth at bedtime.         Marland Kitchen  OMEPRAZOLE MAGNESIUM 20 MG PO TBEC   Oral   Take 20 mg by mouth daily before breakfast.           . QUETIAPINE FUMARATE 100 MG PO TABS   Oral   Take 50-100 mg by mouth See admin instructions. Takes 50mg  (one-half tablet) four times daily as needed for mood stabilization.  Takes 200mg  (2 tablets) every night at bedtime.         Marland Kitchen SIMVASTATIN 80 MG PO TABS   Oral   Take 80 mg by mouth at bedtime.          . TRAZODONE HCL 50 MG PO TABS   Oral   Take 50 mg by mouth at bedtime.         Marland Kitchen LANCET DEVICES MISC   Does not apply   by Does not apply route.             BP 125/80  Pulse 105  Temp 98.4 F (36.9 C) (Oral)  Resp 18  SpO2 97%  Physical Exam  Nursing note and vitals reviewed. Constitutional: He is oriented to person, place, and time. He appears well-developed and well-nourished.  Non-toxic appearance.  HENT:  Head: Normocephalic.  Right Ear: Tympanic membrane and external ear normal.  Left Ear: Tympanic membrane and external ear normal.   Eyes: EOM and lids are normal. Pupils are equal, round, and reactive to light.  Neck: Normal range of motion. Neck supple. Carotid bruit is not present.  Cardiovascular: Normal rate, regular rhythm, normal heart sounds, intact distal pulses and normal pulses.   Pulmonary/Chest: Breath sounds normal. No respiratory distress.  Abdominal: Soft. Bowel sounds are normal. There is no tenderness. There is no guarding.  Musculoskeletal: Normal range of motion.  Lymphadenopathy:       Head (right side): No submandibular adenopathy present.       Head (left side): No submandibular adenopathy present.    He has no cervical adenopathy.  Neurological: He is alert and oriented to person, place, and time. He has normal strength. No cranial nerve deficit or sensory deficit.  Skin: Skin is warm and dry.  Psychiatric: His speech is normal. Thought content is paranoid. He expresses inappropriate judgment. He exhibits a depressed mood. He expresses suicidal ideation. He exhibits normal recent memory and normal remote memory.    ED Course  Procedures (including critical care time)   Labs Reviewed  CBC WITH DIFFERENTIAL  COMPREHENSIVE METABOLIC PANEL  URINE RAPID DRUG SCREEN (HOSP PERFORMED)  ETHANOL   No results found.   No diagnosis found.    MDM  I have reviewed nursing notes, vital signs, and all appropriate lab and imaging results for this patient. Urine drug screen neg. CMP and CBC wnl. Pt cleared for medical screening. Pt seen with me by Dr Denton Lank. Pt to be moved to Pod C for psych eval.  Pt exhibits paranoid tendencies, and states he is considering harming himself. Pt medically stable at the time of movement.       Kathie Dike, [place] 03/23/12 [phone]

## 2012-03-23 NOTE — ED Notes (Signed)
Lunch tray ordered, turkey sandwich given. 

## 2012-03-23 NOTE — ED Notes (Signed)
Pt transferred to Pod C

## 2012-03-23 NOTE — ED Notes (Signed)
"  I've been having some mixed thoughts...Marland KitchenMarland KitchenI have been seeing a Dr at the [place] but this morning it escalated.Marland KitchenMarland KitchenMarland KitchenI'm having some thoughts of doing something to myself".

## 2012-03-23 NOTE — ED Notes (Signed)
All belongings inventoried, black backpack and white belongings bag zip tied together and placed on floor next to locker number 6, 2 envelops of wallet, money jewlery and cell phones taken to security, Mr Iselin informed of both

## 2012-03-23 NOTE — ED Notes (Signed)
[name]. Pt ambulatory

## 2012-03-23 NOTE — BH Assessment (Signed)
Assessment Note  Update:  Received call from [facility] stating pt accepted by Donell Sievert, PA to Dr. Daleen Bo to bed 502-2 and that pt could be transported to [facility].  Updated EDP Pickering and ED staff.  Completed support paperwork, updated assessment disposition, completed assessment notification, and faxed to [facility] to log.  Updated ED staff.  ED staff to arrange transport to [facility] via security, as pt is voluntary.    Disposition:  Disposition Disposition of Patient: Inpatient treatment program Type of inpatient treatment program: Adult Patient referred to: Other (Comment) (Pt accepted [facility])  On Site Evaluation by:   Reviewed with Physician:  Thornton Papas, Rennis Harding 03/23/2012 4:29 PM
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
## 2012-03-23 NOTE — ED Notes (Signed)
Report given to [facility] nurse-

## 2012-03-24 DIAGNOSIS — F1994 Other psychoactive substance use, unspecified with psychoactive substance-induced mood disorder: Secondary | ICD-10-CM

## 2012-03-24 MED ORDER — QUETIAPINE FUMARATE 100 MG PO TABS
50.0000 mg | ORAL_TABLET | Freq: Two times a day (BID) | ORAL | Status: DC
  Administered 2012-03-24 – 2012-03-27 (×7): 50 mg via ORAL
  Filled 2012-03-24 (×10): qty 1

## 2012-03-24 NOTE — Progress Notes (Signed)
Patient ID: [name], male   DOB: 1955/03/30, 57 y.o.   MRN: [id number] D: No new data from previous assessment. Patient in bed sleeping. Respiration regular and unlabored. No sign of distress noted at this time A: 15 mins checks for  Safety. R: Patient remains asleep. Pt is safe.

## 2012-03-24 NOTE — Progress Notes (Signed)
[facility] Group Notes:  (Nursing/MHT/Case Management/Adjunct)  Date:  03/24/2012  Time:  11:10 PM  Type of Therapy:  Psychoeducational Skills  Participation Level:  Active  Participation Quality:  Appropriate  Affect:  Appropriate  Cognitive:  Appropriate  Insight:  Good  Engagement in Group:  Engaged  Modes of Intervention:  Education  Summary of Progress/Problems: The patient stated that he had a good day overall. He stated that his medication is in the process of being changed. His goal for tomorrow is to have his medication straightened out and to get some rest.   Lainy Wrobleski S 03/24/2012, 11:10 PM

## 2012-03-24 NOTE — Progress Notes (Signed)
D: Patient's affect/mood is appropriate to circumstance; anxious at times. He reported on the self inventory sheet that he has a hyper energy level and ability to pay attention is improving. Patient rated depression "6" and feelings of hopelessness "5".  A: Support and encouragement provided to patient. Scheduled medications administered per ordering MD. Monitor Q15 minute checks for safety.  R: Patient receptive. Denies SI/HI/AVH. Patient remains safe.
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
## 2012-03-24 NOTE — Treatment Plan (Signed)
Interdisciplinary Treatment Plan Update (Adult)  Date: 03/24/2012  Time Reviewed: 2:49 PM   Progress in Treatment: Attending groups: Yes Participating in groups: Yes Taking medication as prescribed: Yes Tolerating medication: Yes   Family/Significant other contact made:  No Patient understands diagnosis:  Yes As evidenced by asking for help with mood stabilization and psychois Discussing patient identified problems/goals with staff:  Yes Medical problems stabilized or resolved:  Yes Denies suicidal/homicidal ideation: No  States it is "on and off" Issues/concerns per patient self-inventory:  Yes  Rates depression a 6 and hopelessness a 5 , also pain at a 10 due to "joint disease" Other:  New problem(s) identified: N/A  Reason for Continuation of Hospitalization: Depression Hallucinations Medication stabilization  Interventions implemented related to continuation of hospitalization: Change Seroquel to 50 bid, continue remeron and trazodone  Encourage group attendance and participation  Additional comments:  Estimated length of stay: 4-5 days  Discharge Plan: return home, follow up outpt  New goal(s): N/A  Review of initial/current patient goals per problem list:   1.  Goal(s): Eliminate SI  Met:  No  Target date:2/12  As evidenced [id number] report  2.  Goal (s): Stabilize mood with the help of medication/therapeutic milieu  Met:  No  Target date:2/12  As evidenced [id number] will rate his depression and hopelessness at a 4 or less on a 10 scale, and will fell that his anger and agitation is under control  3.  Goal(s):Eliminate psychosis  Met:  No  Target date:2/12  As evidenced [id number] is complaining of seeing things that are not there  4.  Goal(s):Identify outpt provider  Met:  No  Target date:2/12  As evidenced [id number] of follow up appointment time and date  Attendees: Patient:  [name] 03/24/2012 2:49PM  Family:     Physician:   Patrick Zachry Hopfensperger 03/24/2012 2:49 PM   Nursing: Lamount Cranker   03/24/2012 2:49 PM   Clinical Social Worker:  Richelle Ito 03/24/2012 2:49 PM   Extender:   03/24/2012 2:49 PM   Other:     Other:     Other:     Other:      Scribe for Treatment Team:   [name], 03/24/2012 2:49 PM

## 2012-03-24 NOTE — Tx Team (Signed)
Initial Interdisciplinary Treatment Plan  PATIENT STRENGTHS: (choose at least two) Ability for insight Active sense of humor Average or above average intelligence Communication skills General fund of knowledge Motivation for treatment/growth Supportive family/friends  PATIENT STRESSORS: Financial difficulties Health problems Medication change or noncompliance Substance abuse   PROBLEM LIST: Problem List/Patient Goals Date to be addressed Date deferred Reason deferred Estimated date of resolution   "I want to get my mood under Control."                                                       DISCHARGE CRITERIA:  Ability to meet basic life and health needs Adequate post-discharge living arrangements Improved stabilization in mood, thinking, and/or behavior Medical problems require only outpatient monitoring Motivation to continue treatment in a less acute level of care Need for constant or close observation no longer present Reduction of life-threatening or endangering symptoms to within safe limits Safe-care adequate arrangements made Verbal commitment to aftercare and medication compliance  PRELIMINARY DISCHARGE PLAN: Return to previous work or school arrangements  PATIENT/FAMIILY INVOLVEMENT: This treatment plan has been presented to and reviewed with the patient, [name], and/or family member.  The patient and family have been given the opportunity to ask questions and make suggestions.  Suzanne Kho ANN 03/24/2012, 12:06 AM

## 2012-03-24 NOTE — Progress Notes (Signed)
[facility] LCSW Aftercare Discharge Planning Group Note  03/24/2012 3:39 PM  Participation Quality: Patient left group early to meet with MD.  Returned as group was ending.   Wynn Banker 03/24/2012, 3:39 PM
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
## 2012-03-24 NOTE — Progress Notes (Addendum)
[facility] Group Notes:  (Nursing/MHT/Case Management/Adjunct)  Date:  03/24/2012  Time:  6:25 PM  Type of Therapy:  Therapeutic Activity- Recovery Goals  Participation Level:  Active  Participation Quality:  Appropriate, Attentive, Sharing and Supportive  Affect:  Appropriate  Cognitive:  Appropriate  Insight:  Appropriate  Engagement in Group:  Developing/Improving and Engaged  Modes of Intervention:  Activity, Education, Socialization and Support  Summary of Progress/Problems: [name] attended and participated in therapeutic activity on recovery goals. Patient stated one goal on recovery for to have when discharged, once written another group member would read the goal and the patient would explain why they chose that goal. Patient was guarded when sharing due to the resistant with sharing in a group setting.     [name] 03/24/2012, 6:25 PM
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
## 2012-03-24 NOTE — Progress Notes (Signed)
Patient ID: [name], male   DOB: 1955-07-18, 57 y.o.   MRN: [id number] D: Pt affect and mood is anxious but he appropriate with peers and staff.  Pt attended evening wrap up group. Pt stated goal is to "get some rest get balance in his recovery". Pt denies SI/HI/AVH and pain. Cooperative with assessment. No acute distressed noted at this time.   A: Met with pt 1:1. Medications administered as prescribed. Writer encouraged pt to discuss feelings. Pt encouraged to come to staff with any question or concerns.  R: Patient remains safe. He is complaint with medications and group programming. Continue current POC.

## 2012-03-24 NOTE — Progress Notes (Signed)
[facility] LCSW Group Therapy  03/24/2012 3:39 PM  Type of Therapy:  Group Therapy  Participation Level:  Active  Participation Quality:  Appropriate  Affect:  Appropriate  Cognitive:  Appropriate  Insight: Engaged5  Engagement in Therapy:  Engaged  Modes of Intervention:  Discussion, Exploration, Problem-solving, Rapport Building and Support  Summary of Progress/Problems:  Patient shared he relapses when he becomes noncompliant.  He shared he often feels he does not deserve to live a good and sober life.  He shared this kind of thinking lead him to stop taking medications and/or to use alcohol/drugs.  [name] 03/24/2012, 3:39 PM

## 2012-03-24 NOTE — Progress Notes (Signed)
Adult Psychoeducational Group Note  Date:  03/24/2012 Time:  12:10 PM  Group Topic/Focus:  Relapse Prevention Planning:   The focus of this group is to define relapse and discuss the need for planning to combat relapse.  Participation Level:  Active  Participation Quality:  Attentive  Affect:  Appropriate  Cognitive:  Alert and Appropriate  Insight: Good  Engagement in Group:  Engaged  Modes of Intervention:  Discussion, Education and Support  Additional Comments:  Pt talked about getting his medication adjusted while in the hospital and talked about getting help with his anxiety. Pt is vested in treatment and shared his coping skills for recovery.  Oberon Hehir T 03/24/2012, 12:10 PM

## 2012-03-25 DIAGNOSIS — F39 Unspecified mood [affective] disorder: Secondary | ICD-10-CM

## 2012-03-25 MED ORDER — ADULT MULTIVITAMIN W/MINERALS CH
1.0000 | ORAL_TABLET | Freq: Every day | ORAL | Status: DC
  Administered 2012-03-25 – 2012-03-27 (×3): 1 via ORAL
  Filled 2012-03-25 (×4): qty 1

## 2012-03-25 MED ORDER — TRAMADOL HCL 50 MG PO TABS
50.0000 mg | ORAL_TABLET | Freq: Four times a day (QID) | ORAL | Status: DC | PRN
  Administered 2012-03-25: 50 mg via ORAL
  Filled 2012-03-25: qty 1

## 2012-03-25 MED ORDER — DIVALPROEX SODIUM ER 500 MG PO TB24
500.0000 mg | ORAL_TABLET | Freq: Every day | ORAL | Status: DC
  Administered 2012-03-25 – 2012-03-27 (×3): 500 mg via ORAL
  Filled 2012-03-25 (×4): qty 1

## 2012-03-25 MED ORDER — DIVALPROEX SODIUM ER 500 MG PO TB24
1000.0000 mg | ORAL_TABLET | Freq: Every day | ORAL | Status: DC
  Administered 2012-03-25 – 2012-03-26 (×2): 1000 mg via ORAL
  Filled 2012-03-25 (×3): qty 2

## 2012-03-25 MED ORDER — QUETIAPINE FUMARATE 200 MG PO TABS
200.0000 mg | ORAL_TABLET | Freq: Every day | ORAL | Status: DC
  Administered 2012-03-25 – 2012-03-26 (×2): 200 mg via ORAL
  Filled 2012-03-25 (×3): qty 1

## 2012-03-25 NOTE — Progress Notes (Signed)
Pt confrontational with peers and staff at shift change. Has been cooperative rest of evening. Playing cards with peers in the dayroom. On 1:1 pt states he is doing well and feels ready for discharge. When asked about SI pt states, "I never really was suicidal. They kinda told me I needed to say that to get in here." Minimizes severity of symptoms prior to admit. Reports hip pain is at a 4/10 but denies needing prn med. Pt supported and encouraged. Fall precautions reviewed and encouraged. He denies HI/AVH and remains safe. Lawrence Marseilles

## 2012-03-25 NOTE — Progress Notes (Signed)
D Pt is seen OOB UAL on the 500 hall today. HE laughs and jokes with staff, and is pleasant to his peers that he is seen interacting with. He attends his groups, he is engaged in the discussion and he is trying to understand his feelings and his related behaviors.    A He is started on tramadol and depakote for c/o arthritis pain  And mood instability.   R Safety is in place and POC cont.

## 2012-03-25 NOTE — Progress Notes (Signed)
Group Topic/Focus:  Identifying Needs:   The focus of this group is to help patients identify their personal needs that have been historically problematic and identify healthy behaviors to address their needs.  Participation Level:  Active  Participation Quality:  Appropriate  Affect:  Appropriate  Cognitive:  Appropriate  Insight:  Improving  Engagement in Group:  Engaged  Additional Comments:    Alanta Scobey A 

## 2012-03-25 NOTE — Clinical Social Work Psychosocial (Signed)
[facility] Group Notes: (Clinical Social Work)   03/25/2012      Type of Therapy:  Group Therapy   Participation Level:  Did Not Attend    Ambrose Mantle, LCSW 03/25/2012, 5:31 PM

## 2012-03-25 NOTE — Progress Notes (Signed)
[facility] MD Progress Note  03/25/2012 9:25 AM [name]  MRN:  [id number] Subjective:  Clean 8 months! Has not been himself lately - acts crazy impulsively. Will start cursing and Thursday threw the cleaning bucket  Of water for no reason. Came in to get his meds adjusted and is requesting depakote. Diagnosis:  Axis I: Mood Disorder NOS  ADL's:  Intact  Sleep: Good  Appetite:  Good  Suicidal Ideation:  Denies  Homicidal Ideation:  Denies  AEB (as evidenced by):  Psychiatric Specialty Exam: Review of Systems  Constitutional: Negative.   All other systems reviewed and are negative.    Blood pressure 138/93, pulse 118, temperature 98.5 F (36.9 C), temperature source Oral, resp. rate 18, height 5\' 8"  (1.727 m), weight 95.709 kg (211 lb).Body mass index is 32.09 kg/(m^2).  General Appearance: Well Groomed  Patent attorney::  Good  Speech:  Clear and Coherent and Normal Rate  Volume:  Normal  Mood:  Calm today   Affect:  Full Range  Thought Process:  Coherent, Intact and Logical  Orientation:  Full (Time, Place, and Person)  Thought Content:  No AVH/psychosis   Suicidal Thoughts:  No  Homicidal Thoughts:  No  Memory:  Intact   Judgement:  Good  Insight:  Good  Psychomotor Activity:  Normal walks with a cane   Concentration:  Good  Recall:  Good  Akathisia:  No  Handed:  Right  AIMS (if indicated):     Assets:  Communication Skills Desire for Improvement Financial Resources/Insurance Housing Physical Health Resilience Social Support Transportation  Sleep:  Number of Hours: 6.5   Current Medications: Current Facility-Administered Medications  Medication Dose Route Frequency Provider Last Rate Last Dose  . acetaminophen (TYLENOL) tablet 650 mg  650 mg Oral Q6H PRN Verne Spurr, PA-C      . alum & mag hydroxide-simeth (MAALOX/MYLANTA) 200-200-20 MG/5ML suspension 30 mL  30 mL Oral Q4H PRN Verne Spurr, PA-C      . divalproex (DEPAKOTE ER) 24 hr tablet 1,000 mg   1,000 mg Oral QHS Fredrik Cove, PA-C      . divalproex (DEPAKOTE ER) 24 hr tablet 500 mg  500 mg Oral Daily Fredrik Cove, PA-C      . lisinopril (PRINIVIL,ZESTRIL) tablet 10 mg  10 mg Oral Daily Verne Spurr, PA-C   10 mg at 03/25/12 7846  . magnesium hydroxide (MILK OF MAGNESIA) suspension 30 mL  30 mL Oral Daily PRN Verne Spurr, PA-C      . metFORMIN (GLUCOPHAGE) tablet 1,000 mg  1,000 mg Oral BID WC Verne Spurr, PA-C   1,000 mg at 03/25/12 9629  . metoprolol tartrate (LOPRESSOR) tablet 25 mg  25 mg Oral Daily Verne Spurr, PA-C   25 mg at 03/25/12 5284  . mirtazapine (REMERON) tablet 45 mg  45 mg Oral QHS Verne Spurr, PA-C   45 mg at 03/24/12 2214  . nicotine (NICODERM CQ - dosed in mg/24 hours) patch 21 mg  21 mg Transdermal Q0600 Verne Spurr, PA-C   21 mg at 03/25/12 1324  . pantoprazole (PROTONIX) EC tablet 40 mg  40 mg Oral Daily Verne Spurr, PA-C   40 mg at 03/25/12 4010  . QUEtiapine (SEROQUEL) tablet 200 mg  200 mg Oral QHS Fredrik Cove, PA-C      . QUEtiapine (SEROQUEL) tablet 50 mg  50 mg Oral BID Himabindu Ravi, MD   50 mg at 03/25/12 2725  . simvastatin (ZOCOR) tablet 80 mg  80 mg Oral QHS Verne Spurr, PA-C   80 mg at 03/24/12 2214  . traMADol (ULTRAM) tablet 50 mg  50 mg Oral Q6H PRN Fredrik Cove, PA-C        Lab Results:  Results for orders placed during the hospital encounter of 03/23/12 (from the past 48 hour(s))  URINE RAPID DRUG SCREEN (HOSP PERFORMED)     Status: None   Collection Time    03/23/12 10:41 AM      Result Value Range   Opiates NONE DETECTED  NONE DETECTED   Cocaine NONE DETECTED  NONE DETECTED   Benzodiazepines NONE DETECTED  NONE DETECTED   Amphetamines NONE DETECTED  NONE DETECTED   Tetrahydrocannabinol NONE DETECTED  NONE DETECTED   Barbiturates NONE DETECTED  NONE DETECTED   Comment:            DRUG SCREEN FOR MEDICAL PURPOSES     ONLY.  IF CONFIRMATION IS NEEDED     FOR ANY PURPOSE, NOTIFY LAB     WITHIN 5 DAYS.                 LOWEST DETECTABLE LIMITS     FOR URINE DRUG SCREEN     Drug Class       Cutoff (ng/mL)     Amphetamine      1000     Barbiturate      200     Benzodiazepine   200     Tricyclics       300     Opiates          300     Cocaine          300     THC              50  CBC WITH DIFFERENTIAL     Status: None   Collection Time    03/23/12 10:48 AM      Result Value Range   WBC 4.7  4.0 - 10.5 K/uL   RBC 5.14  4.22 - 5.81 MIL/uL   Hemoglobin 15.1  13.0 - 17.0 g/dL   HCT 91.4  78.2 - 95.6 %   MCV 84.0  78.0 - 100.0 fL   MCH 29.4  26.0 - 34.0 pg   MCHC 35.0  30.0 - 36.0 g/dL   RDW 21.3  08.6 - 57.8 %   Platelets 168  150 - 400 K/uL   Neutrophils Relative 54  43 - 77 %   Neutro Abs 2.6  1.7 - 7.7 K/uL   Lymphocytes Relative 37  12 - 46 %   Lymphs Abs 1.7  0.7 - 4.0 K/uL   Monocytes Relative 7  3 - 12 %   Monocytes Absolute 0.3  0.1 - 1.0 K/uL   Eosinophils Relative 1  0 - 5 %   Eosinophils Absolute 0.1  0.0 - 0.7 K/uL   Basophils Relative 0  0 - 1 %   Basophils Absolute 0.0  0.0 - 0.1 K/uL  COMPREHENSIVE METABOLIC PANEL     Status: Abnormal   Collection Time    03/23/12 10:48 AM      Result Value Range   Sodium 143  135 - 145 mEq/L   Potassium 4.1  3.5 - 5.1 mEq/L   Chloride 108  96 - 112 mEq/L   CO2 24  19 - 32 mEq/L   Glucose, Bld 102 (*) 70 - 99 mg/dL   BUN 20  6 - 23 mg/dL   Creatinine, Ser 6.04  0.50 - 1.35 mg/dL   Calcium 9.6  8.4 - 54.0 mg/dL   Total Protein 7.1  6.0 - 8.3 g/dL   Albumin 4.0  3.5 - 5.2 g/dL   AST 21  0 - 37 U/L   ALT 17  0 - 53 U/L   Alkaline Phosphatase 64  39 - 117 U/L   Total Bilirubin 0.3  0.3 - 1.2 mg/dL   GFR calc non Af Amer >90  >90 mL/min   GFR calc Af Amer >90  >90 mL/min   Comment:            The eGFR has been calculated     using the CKD EPI equation.     This calculation has not been     validated in all clinical     situations.     eGFR's persistently     <90 mL/min signify     possible Chronic Kidney Disease.  ETHANOL      Status: None   Collection Time    03/23/12 10:48 AM      Result Value Range   Alcohol, Ethyl (B) <11  0 - 11 mg/dL   Comment:            LOWEST DETECTABLE LIMIT FOR     SERUM ALCOHOL IS 11 mg/dL     FOR MEDICAL PURPOSES ONLY    Physical Findings: AIMS: Facial and Oral Movements Muscles of Facial Expression: None, normal Lips and Perioral Area: None, normal Jaw: None, normal Tongue: None, normal,Extremity Movements Upper (arms, wrists, hands, fingers): None, normal Lower (legs, knees, ankles, toes): None, normal, Trunk Movements Neck, shoulders, hips: None, normal, Overall Severity Severity of abnormal movements (highest score from questions above): None, normal Incapacitation due to abnormal movements: None, normal Patient's awareness of abnormal movements (rate only patient's report): No Awareness, Dental Status Current problems with teeth and/or dentures?: No Does patient usually wear dentures?: No  CIWA:  CIWA-Ar Total: 0 COWS:     Treatment Plan Summary: Daily contact with patient to assess and evaluate symptoms and progress in treatment Medication management  Plan: Adjust meds by starting  Depakote changing mobic to tramadol   Medical Decision Making Problem Points:  Established problem, stable/improving (1), New problem, with no additional work-up planned (3), Review of last therapy session (1) and Review of psycho-social stressors (1) Data Points:  Decision to obtain old records (1) Review or order clinical lab tests (1) Review or order medicine tests (1) Review of medication regiment & side effects (2) Review of new medications or change in dosage (2)  I certify that inpatient services furnished can reasonably be expected to improve the patient's condition.   Kiora Hallberg,MICKIE D. RPA-C CAQ-PSych  03/25/2012, 9:25 AM

## 2012-03-25 NOTE — Progress Notes (Signed)
  GOALS GROUP  The focus of this group is to help patients establish daily goals to achieve during treatment and discuss how the patient can incorporate goal setting into their daily lives to aide in recovery.   Date: 03/25/2012 Time:  0900  Participation Level:  Active  Participation Quality:  Attentive  Affect:  Appropriate  Cognitive:  Appropriate  Insight:  Engaged  Engagement in Group:  Engaged  Additional Comments:    Danee Soller A 

## 2012-03-25 NOTE — Progress Notes (Deleted)
D Pt returned ambulatory from ED ( for assessment of continued left knee pain) and states " they said nothin's wrong with my knee..I sure am glad". Pt given a cup of ice per his request  And UAL without diff.

## 2012-03-26 NOTE — Progress Notes (Signed)
Psychoeducational Group Note  Date:  03/26/2012 Time:  1015  Group Topic/Focus:  Making Healthy Choices:   The focus of this group is to help patients identify negative/unhealthy choices they were using prior to admission and identify positive/healthier coping strategies to replace them upon discharge.  Participation Level: Did not attend    Dione Housekeeper 03/26/2012

## 2012-03-26 NOTE — Progress Notes (Signed)
[facility] Group Notes:  (Nursing/MHT/Case Management/Adjunct)  Date:  03/26/2012  Time:  2:42 PM  Type of Therapy:  MHT Group  Participation Level:  Minimal  Participation Quality:  Attentive  Affect:  Anxious  Cognitive:  Alert  Insight:  Lacking  Engagement in Group:  Improving  Modes of Intervention:  Discussion  Summary of Progress/Problems:  Andrena Mews 03/26/2012, 2:42 PM

## 2012-03-26 NOTE — Progress Notes (Signed)
Psychoeducational Group Note  Date:  03/26/2012 Time:  1015  Group Topic/Focus:  Making Healthy Choices:   The focus of this group is to help patients identify negative/unhealthy choices they were using prior to admission and identify positive/healthier coping strategies to replace them upon discharge.  Participation Level:  Active  Participation Quality:  Appropriate  Affect:  Appropriate  Cognitive:  Appropriate  Insight:  Improving  Engagement in Group:  Improving  Additional Comments:    [name] A 03/26/2012 

## 2012-03-26 NOTE — Clinical Social Work Note (Signed)
[facility] Group Notes:  (Clinical Social Work)   [address] - patient was present 03/26/2012   11:15-11:45AM  Summary of Progress/Problems:  The main focus of today's process group was to listen to a variety of genres of music and to identify that different types of music provoke different responses.  The patient then was able to identify personally what was soothing for them, as well as energizing.  Examples were given of how to use this knowledge in sleep habits, with depression, and with other symptoms.  The patient expressed ability to understand what types of music evoke different emotions in him, as evidenced by filling out the worksheet and participating actively  Type of Therapy:  Music Therapy with processing done  Participation Level:  Active  Participation Quality:  Attentive, Intrusive and Sharing  Affect:  Excited  Cognitive:  Appropriate  Insight:  Engaged  Engagement in Therapy:  Engaged  Modes of Intervention:   Socialization, Support and Processing, Exploration, Education, Rapport Building   [employer], LCSW 03/26/2012, 12:52 PM

## 2012-03-26 NOTE — Progress Notes (Signed)
D Pt is seen in the group room this AM. He adamantly says in group" I'm not going to work on that workbook...ibuprofen can do it any other day in the week but I don't work on Sat or SUn". The pt  Laughs loudly when joking with staff and other pts. He takes his meds as ordered and says he feels like depakote is " helping me a lot".  A This AM, 5 patients ( 1 male and 84 male) spoke with this nurse and  stated pt had been making sexually inappropriate comments to other patients, pt had been overheard previous night making deragatory statements to another pt who was passing gas and that they felt " uncomfortable, unsafe and dont want to go to groups when he's there.". This nurse received phone call from a patient's husband whos was also inquiring about the treatment of his wife., whom the husband said had told him she felt uncomfortable and unsafe around this pt.   R Pt was moved to [address] and continued the day over there...UAL without diff. POC to move forward.

## 2012-03-26 NOTE — Progress Notes (Signed)
Patient ID: [name], male   DOB: [birth year], 56 y.o.   MRN: [id number] [facility] MD Progress Note  03/26/2012 11:21 AM [name]  MRN:  [id number] Subjective:  Although he had a good response to Depakote staff asked that he be transferred off the ward. Other patients reported sexually inappropriate and not feeling safe around him. He is very distressed but will willingly go to the other ward. Will check Dep level in am.   Diagnosis:  Axis I: Mood Disorder NOS  ADL's:  Intact  Sleep: Good  Appetite:  Good  Suicidal Ideation:  Denies  Homicidal Ideation:  Denies  AEB (as evidenced by):  Psychiatric Specialty Exam: Review of Systems  Constitutional: Negative.   All other systems reviewed and are negative.    Blood pressure 120/82, pulse 123, temperature 99 F (37.2 C), temperature source Oral, resp. rate 18, height 5\' 8"  (1.727 m), weight 95.709 kg (211 lb).Body mass index is 32.09 kg/(m^2).  General Appearance: Well Groomed  Patent attorney::  Good  Speech:  Clear and Coherent and Normal Rate  Volume:  Normal  Mood:  Calm today   Affect:  Full Range  Thought Process:  Coherent, Intact and Logical  Orientation:  Full (Time, Place, and Person)  Thought Content:  No AVH/psychosis   Suicidal Thoughts:  No  Homicidal Thoughts:  No  Memory:  Intact   Judgement:  Good  Insight:  Good  Psychomotor Activity:  Normal walks with a cane   Concentration:  Good  Recall:  Good  Akathisia:  No  Handed:  Right  AIMS (if indicated):     Assets:  Communication Skills Desire for Improvement Financial Resources/Insurance Housing Physical Health Resilience Social Support Transportation  Sleep:  Number of Hours: 6.75   Current Medications: Current Facility-Administered Medications  Medication Dose Route Frequency Provider Last Rate Last Dose  . acetaminophen (TYLENOL) tablet 650 mg  650 mg Oral Q6H PRN Verne Spurr, PA-C      . alum & mag hydroxide-simeth (MAALOX/MYLANTA)  200-200-20 MG/5ML suspension 30 mL  30 mL Oral Q4H PRN Verne Spurr, PA-C      . divalproex (DEPAKOTE ER) 24 hr tablet 1,000 mg  1,000 mg Oral QHS Fredrik Cove, PA-C   1,000 mg at 03/25/12 2127  . divalproex (DEPAKOTE ER) 24 hr tablet 500 mg  500 mg Oral Daily Fredrik Cove, PA-C   500 mg at 03/26/12 0818  . lisinopril (PRINIVIL,ZESTRIL) tablet 10 mg  10 mg Oral Daily Verne Spurr, PA-C   10 mg at 03/26/12 0818  . magnesium hydroxide (MILK OF MAGNESIA) suspension 30 mL  30 mL Oral Daily PRN Verne Spurr, PA-C      . metFORMIN (GLUCOPHAGE) tablet 1,000 mg  1,000 mg Oral BID WC Verne Spurr, PA-C   1,000 mg at 03/26/12 0819  . metoprolol tartrate (LOPRESSOR) tablet 25 mg  25 mg Oral Daily Verne Spurr, PA-C   25 mg at 03/26/12 0818  . mirtazapine (REMERON) tablet 45 mg  45 mg Oral QHS Verne Spurr, PA-C   45 mg at 03/25/12 2126  . multivitamin with minerals tablet 1 tablet  1 tablet Oral Daily Fredrik Cove, PA-C   1 tablet at 03/26/12 0818  . nicotine (NICODERM CQ - dosed in mg/24 hours) patch 21 mg  21 mg Transdermal Q0600 Verne Spurr, PA-C   21 mg at 03/26/12 0819  . pantoprazole (PROTONIX) EC tablet 40 mg  40 mg Oral Daily Verne Spurr, PA-C  40 mg at 03/26/12 0819  . QUEtiapine (SEROQUEL) tablet 200 mg  200 mg Oral QHS Fredrik Cove, PA-C   200 mg at 03/25/12 2127  . QUEtiapine (SEROQUEL) tablet 50 mg  50 mg Oral BID Himabindu Ravi, MD   50 mg at 03/26/12 0819  . simvastatin (ZOCOR) tablet 80 mg  80 mg Oral QHS Verne Spurr, PA-C   80 mg at 03/25/12 2126  . traMADol (ULTRAM) tablet 50 mg  50 mg Oral Q6H PRN Fredrik Cove, PA-C   50 mg at 03/25/12 1158    Lab Results:  No results found for this or any previous visit (from the past 48 hour(s)).  Physical Findings: AIMS: Facial and Oral Movements Muscles of Facial Expression: None, normal Lips and Perioral Area: None, normal Jaw: None, normal Tongue: None, normal,Extremity Movements Upper (arms, wrists, hands,  fingers): None, normal Lower (legs, knees, ankles, toes): None, normal, Trunk Movements Neck, shoulders, hips: None, normal, Overall Severity Severity of abnormal movements (highest score from questions above): None, normal Incapacitation due to abnormal movements: None, normal Patient's awareness of abnormal movements (rate only patient's report): No Awareness, Dental Status Current problems with teeth and/or dentures?: No Does patient usually wear dentures?: No  CIWA:  CIWA-Ar Total: 0 COWS:     Treatment Plan Summary: Daily contact with patient to assess and evaluate symptoms and progress in treatment Medication management  Plan: Adjust meds by starting  Depakote changing mobic to tramadol   Medical Decision Making Problem Points:  Established problem, stable/improving (1), New problem, with no additional work-up planned (3), Review of last therapy session (1) and Review of psycho-social stressors (1) Data Points:  Decision to obtain old records (1) Review or order clinical lab tests (1) Review or order medicine tests (1) Review of medication regiment & side effects (2) Review of new medications or change in dosage (2)  I certify that inpatient services furnished can reasonably be expected to improve the patient's condition.   Justn Quale,MICKIE D. RPA-C CAQ-PSych  03/26/2012, 11:21 AM

## 2012-03-27 DIAGNOSIS — F329 Major depressive disorder, single episode, unspecified: Principal | ICD-10-CM

## 2012-03-27 LAB — VALPROIC ACID LEVEL: Valproic Acid Lvl: 80.7 ug/mL (ref 50.0–100.0)

## 2012-03-27 MED ORDER — DIVALPROEX SODIUM ER 500 MG PO TB24: 500.0000 mg | ORAL_TABLET | Freq: Every day | ORAL | Status: DC

## 2012-03-27 MED ORDER — TRAMADOL HCL 50 MG PO TABS: 50.0000 mg | ORAL_TABLET | Freq: Four times a day (QID) | ORAL | Status: DC | PRN

## 2012-03-27 MED ORDER — METOPROLOL TARTRATE 25 MG PO TABS
25.0000 mg | ORAL_TABLET | Freq: Two times a day (BID) | ORAL | Status: DC
Start: 2012-03-27 — End: 2012-11-03

## 2012-03-27 MED ORDER — QUETIAPINE FUMARATE 100 MG PO TABS: 50.0000 mg | ORAL_TABLET | ORAL | Status: DC

## 2012-03-27 MED ORDER — SIMVASTATIN 80 MG PO TABS: 80.0000 mg | ORAL_TABLET | Freq: Every day | ORAL | Status: DC

## 2012-03-27 MED ORDER — DIVALPROEX SODIUM ER 500 MG PO TB24
1000.0000 mg | ORAL_TABLET | Freq: Every day | ORAL | Status: DC
Start: 2012-03-27 — End: 2012-04-20

## 2012-03-27 MED ORDER — OMEPRAZOLE MAGNESIUM 20 MG PO TBEC
20.0000 mg | DELAYED_RELEASE_TABLET | Freq: Every day | ORAL | Status: DC
Start: 2012-03-27 — End: 2012-11-03

## 2012-03-27 MED ORDER — METOPROLOL TARTRATE 25 MG PO TABS: 25.0000 mg | ORAL_TABLET | Freq: Every day | ORAL | Status: DC

## 2012-03-27 MED ORDER — MIRTAZAPINE 45 MG PO TABS: 45.0000 mg | ORAL_TABLET | Freq: Every day | ORAL | Status: DC

## 2012-03-27 MED ORDER — METFORMIN HCL 1000 MG PO TABS
1000.0000 mg | ORAL_TABLET | Freq: Two times a day (BID) | ORAL | Status: DC
Start: 2012-03-27 — End: 2012-11-03

## 2012-03-27 MED ORDER — LISINOPRIL 10 MG PO TABS: 20.0000 mg | ORAL_TABLET | Freq: Every day | ORAL | Status: DC

## 2012-03-27 MED ORDER — GABAPENTIN 100 MG PO CAPS: 100.0000 mg | ORAL_CAPSULE | Freq: Every day | ORAL | Status: DC

## 2012-03-27 MED ORDER — LISINOPRIL 10 MG PO TABS: 10.0000 mg | ORAL_TABLET | Freq: Every day | ORAL | Status: DC

## 2012-03-27 NOTE — Progress Notes (Signed)
D: Pt is HIGH FALL RISK. Pt asleep throughout evening shift except for when awakened for medications and during 1:1 assessment.  Voiced no complaints.  Eye contact fair with animated facial expression.  Affect appropriate, mood euthymic.  Interaction was minimal due to Pt's sleeping, but speech logical and coherent.  Pt appeared calm and there was no overt evident of disorganized thought process or content.  He admits that he does not want to attend groups.  Denies SI/HI, AVH, and reports chronic bilateral arthritic knee pain (rated 6/10) but refused medication for this.  Contracting for safety on unit.  Pt is due for depakote level 03/27/2012 am.  A: Provided verbal support, offered various modalities to decrease knee pain that were refused.  No PRNs given.  All medications administered according to med orders and POC transferred from [address].  Q15 minute safety checks maintained as per unit protocol.  R: Pt disinterested in interaction this evening; no inappropriate behaviors noted.  Safety maintained. Dion Saucier RN

## 2012-03-27 NOTE — Progress Notes (Signed)
Nursing d/c note:  Patient is appropriate at d/c process.  Denies SI/HI.  Verbalizes understanding of all d/c instruction. Reviewed medications and f/u plans.  All belongings returned and [name] is using his own bus pass for transportation.

## 2012-03-27 NOTE — Progress Notes (Signed)
Recreation Therapy Notes  03/27/2012         Time: 9:30am      Group Topic/Focus: Exercise Education/Wellness  Participation Level: Active   Participation Quality: Appropriate  Affect: Bright  Cognitive: Appropriate   Additional Comments: Patient did stretches with the group. Patient spoke about participating in exercise at the [facility] closest to his home. Patient stated he is ready to be discharged so he can go back to working out at the [facility] Patient requested information on adult swim classes in local area. Patient played "Exercise Spin" with the group. Patient was able to name volleyball, diving, water aerobics and leg exercises as exercises you can participate in while in the water.  Patient was able to name push-ups, jumping jacks, walking, monkey bars and stretching as exercises you can participate in while at the park. Patient participated in group conversation about exercises you can do without any equipment. Patient stated he learned that "I can do exercise with out straining" and "I can do exercise without hurting myself" as things he learned during recreation therapy group.   Marykay Lex Mahek Schlesinger, LRT/CTRS  Shaunta Oncale L 03/27/2012 12:05 PM

## 2012-03-27 NOTE — [facility] Suicide Risk Assessment (Signed)
Suicide Risk Assessment  Discharge Assessment     Demographic Factors:  [place] male, emmployed  Mental Status Per Nursing Assessment::   On Admission:  Suicidal ideation indicated by patient  Current Mental Status by Physician: Patient alert and oriented to 4. Denies aH/VH/SI/HI. Talks loudly which appears to be baseline for patient. Fair insight and judgement. Loss Factors: Decline in physical health  Historical Factors: Impulsivity  Risk Reduction Factors:   Positive coping skills or problem solving skills  Continued Clinical Symptoms:  Depression:   Recent sense of peace/wellbeing  Cognitive Features That Contribute To Risk:  Thought constriction (tunnel vision)    Suicide Risk:  Minimal: No identifiable suicidal ideation.  Patients presenting with no risk factors but with morbid ruminations; may be classified as minimal risk based on the severity of the depressive symptoms  Discharge Diagnoses:   AXIS I:  Substance Induced Mood Disorder AXIS II:  Deferred AXIS III:   Past Medical History  Diagnosis Date  . Arthritis   . Diabetes mellitus   . Hypertension   . Depression   . High cholesterol   . GERD (gastroesophageal reflux disease)   . Neuropathic pain    AXIS IV:  occupational problems and other psychosocial or environmental problems AXIS V:  61-70 mild symptoms  Plan Of Care/Follow-up recommendations:  Activity:  As tolerated Diet:  Low fat, low sugar diet Follow up with outpatient appointments.  Is patient on multiple antipsychotic therapies at discharge:  No   Has Patient had three or more failed trials of antipsychotic monotherapy by history:  No  Recommended Plan for Multiple Antipsychotic Therapies: NA  [name] 03/27/2012, 10:22 AM

## 2012-03-27 NOTE — Discharge Summary (Signed)
Physician Discharge Summary Note  Patient:  [name] is an 57 y.o., male MRN:  [id number] DOB:  Apr 13, 1955 Patient phone:  [phone] (home)  Patient address:   [address] [place] [address],   Date of Admission:  03/23/2012 Date of Discharge: 03/27/2012  Reason for Admission:  Depression and hallucinations  Discharge Diagnoses: Active Problems:   * No active hospital problems. *  Review of Systems  Constitutional: Negative.   HENT: Negative.   Eyes: Negative.   Respiratory: Negative.   Cardiovascular: Negative.   Gastrointestinal: Negative.   Genitourinary: Negative.   Musculoskeletal:       Bilateral knee pain  Skin: Negative.   Neurological: Negative.   Endo/Heme/Allergies: Negative.   Psychiatric/Behavioral: Positive for depression.   Axis Diagnosis:   AXIS I:  Depressive Disorder NOS AXIS II:  Deferred AXIS III:   Past Medical History  Diagnosis Date  . Arthritis   . Diabetes mellitus   . Hypertension   . Depression   . High cholesterol   . GERD (gastroesophageal reflux disease)   . Neuropathic pain    AXIS IV:  economic problems, occupational problems, other psychosocial or environmental problems, problems related to social environment and problems with primary support group AXIS V:  61-70 mild symptoms  Level of Care:  OP  Hospital Course:  Reviewed chart, vital signs, medications, and notes. 1-Admit for crisis management and stabilization, completed 2-Individual and group therapy attended with participation 3-Medication managemed for depression to reduce current symptoms to base line and improve the patient's overall level of functioning:  Medications reviewed with the patient and he stated no untoward effects, Rx given at discharge with a 3 day supply of seroquel, depakote, and ultram per request 4-Coping skills for depression developed and utilized, hallucinations resolved 5-Addressed health issues--monitoring blood pressures,  adjustments to medications done, stable 6-Treatment plan in place to prevent relapse of depression  7-Psychosocial education regarding relapse prevention and self-care completed 8-Health care follow up as needed for blood pressure and bilateral knee pain, stable--patient requested ultram prescription at discharge for his knee pain 9-Patient denied suicidal/homicidal ideations and auditory/visual hallucinations, follow-up appointments encouraged to attend, patient will continue his care with his regular providers at the [place]  Consults:  None  Significant Diagnostic Studies:  Lab completed and reviewed, stable  Discharge Vitals:   Blood pressure 137/87, pulse 121, temperature 97.1 F (36.2 C), temperature source Oral, resp. rate 18, height 5\' 8"  (1.727 m), weight 95.709 kg (211 lb). Body mass index is 32.09 kg/(m^2). Lab Results:   Results for orders placed during the hospital encounter of 03/23/12 (from the past 72 hour(s))  VALPROIC ACID LEVEL     Status: None   Collection Time    03/27/12  6:21 AM      Result Value Range   Valproic Acid Lvl 80.7  50.0 - 100.0 ug/mL    Physical Findings: AIMS: Facial and Oral Movements Muscles of Facial Expression: None, normal Lips and Perioral Area: None, normal Jaw: None, normal Tongue: None, normal,Extremity Movements Upper (arms, wrists, hands, fingers): None, normal Lower (legs, knees, ankles, toes): None, normal, Trunk Movements Neck, shoulders, hips: None, normal, Overall Severity Severity of abnormal movements (highest score from questions above): None, normal Incapacitation due to abnormal movements: None, normal Patient's awareness of abnormal movements (rate only patient's report): No Awareness, Dental Status Current problems with teeth and/or dentures?: No Does patient usually wear dentures?: No  CIWA:  CIWA-Ar Total: 0 COWS:     Psychiatric  Specialty Exam: See Psychiatric Specialty Exam and Suicide Risk Assessment completed by  Attending Physician prior to discharge.  Discharge destination:  Home  Is patient on multiple antipsychotic therapies at discharge:  No   Has Patient had three or more failed trials of antipsychotic monotherapy by history:  No Recommended Plan for Multiple Antipsychotic Therapies:  N/A  Discharge Orders   Future Orders Complete By Expires     Activity as tolerated - No restrictions  As directed     Diet - low sodium heart healthy  As directed         Medication List    STOP taking these medications       gabapentin 100 MG capsule  Commonly known as:  NEURONTIN     lamoTRIgine 25 MG tablet  Commonly known as:  LAMICTAL     meloxicam 15 MG tablet  Commonly known as:  MOBIC     traZODone 50 MG tablet  Commonly known as:  DESYREL      TAKE these medications     Indication   divalproex 500 MG 24 hr tablet  Commonly known as:  DEPAKOTE ER  Take 2 tablets (1,000 mg total) by mouth at bedtime.   Indication:  Manic Phase of Manic-Depression     divalproex 500 MG 24 hr tablet  Commonly known as:  DEPAKOTE ER  Take 1 tablet (500 mg total) by mouth daily.   Indication:  Manic Phase of Manic-Depression     lisinopril 10 MG tablet  Commonly known as:  PRINIVIL,ZESTRIL  Take 2 tablets (20 mg total) by mouth daily.   Indication:  High Blood Pressure     metFORMIN 1000 MG tablet  Commonly known as:  GLUCOPHAGE  Take 1 tablet (1,000 mg total) by mouth 2 (two) times daily with a meal.   Indication:  Type 2 Diabetes     metoprolol tartrate 25 MG tablet  Commonly known as:  LOPRESSOR  Take 1 tablet (25 mg total) by mouth 2 (two) times daily.   Indication:  High Blood Pressure     mirtazapine 45 MG tablet  Commonly known as:  REMERON  Take 1 tablet (45 mg total) by mouth at bedtime.   Indication:  Trouble Sleeping     omeprazole 20 MG tablet  Commonly known as:  PRILOSEC OTC  Take 1 tablet (20 mg total) by mouth daily before breakfast.   Indication:  Heartburn      QUEtiapine 100 MG tablet  Commonly known as:  SEROQUEL  Take 0.5-1 tablets (50-100 mg total) by mouth See admin instructions. Takes 50mg  (one-half tablet) four times daily as needed for mood stabilization.  Takes 200mg  (2 tablets) every night at bedtime.   Indication:  mood stabilization     simvastatin 80 MG tablet  Commonly known as:  ZOCOR  Take 1 tablet (80 mg total) by mouth at bedtime.   Indication:  hyperlipidemia           Follow-up Information   Follow up with [facility] On 04/03/2012. (2:30 with Mickie)    Contact information:   [address]  [place]  [phone]      Follow up with [facility] On 03/31/2012. (3:00 Rekeeta will come to see you at your address.  Please call her to confirm.)    Contact information:   [address]  [place]  [336] [phone]      Follow-up recommendations:  Activity:  As  tolerated Diet:  Low-sodium heart healthy diet  Comments:  [facility]  Total Discharge Time:  Greater than 30 minutes.  SignedNanine Means, PMH-NP 03/27/2012, 11:44 AM

## 2012-03-27 NOTE — Progress Notes (Signed)
[facility] LCSW Aftercare Discharge Planning Group Note  03/27/2012 9:32 AM  Participation Quality:  Attentive  Affect:  Appropriate  Cognitive:  Oriented  Insight:  Limited  Engagement in Group:  Engaged  Modes of Intervention:  Discussion, Exploration and Socialization  Summary of Progress/Problems:  Reggie is in a good mood.  Denies any symptoms today.  Ready to return to [facility].  Wants confirmation that he is referred to a [facility].  Will confirm today in preparation for d/c.  Daryel Gerald B 03/27/2012, 9:32 AM

## 2012-03-27 NOTE — ED Provider Notes (Signed)
Medical screening examination/treatment/procedure(s) were conducted as a shared visit with non-physician practitioner(s) and myself.  I personally evaluated the patient during the encounter Pt w depression, SA, ?SI. Pt alert, content. depresssed mood. Labs. Act.   Suzi Roots, MD 03/27/12 [phone]

## 2012-03-27 NOTE — Progress Notes (Signed)
[facility] INPATIENT:  Family/Significant Other Suicide Prevention Education  Suicide Prevention Education:  Patient Discharged to Other Healthcare Facility:  Suicide Prevention Education Not Provided: {PT. DISCHARGED TO OTHER HEALTHCARE FACILITY:SUICIDE PREVENTION EDUCATION NOT PROVIDED ([facility]):  The patient is discharging to another healthcare facility for continuation of treatment.  The patient's medical information, including suicide ideations and risk factors, are a part of the medical information shared with the receiving healthcare facility. [name] stays at the [facility], a substance abuse rehab facility.  He returned there from the hospital.  [name] 03/27/2012, 3:47 PM

## 2012-03-28 NOTE — Discharge Summary (Signed)
Reviewed

## 2012-03-31 NOTE — Progress Notes (Signed)
Agree with assessment and plan Payam Gribble A. April Carlyon, M.D. 

## 2012-03-31 NOTE — Progress Notes (Signed)
Agree with assessment and plan Guenevere Roorda A. Isa Hitz, M.D. 

## 2012-03-31 NOTE — Progress Notes (Signed)
Patient Discharge Instructions:  After Visit Summary (AVS):   Faxed to:  03/31/12 Discharge Summary Note:   Faxed to:  03/31/12 Psychiatric Admission Assessment Note:   Faxed to:  03/31/12 Suicide Risk Assessment - Discharge Assessment:   Faxed to:  03/31/12 Faxed/Sent to the [facility] Care provider:  03/31/12 Faxed to [facility] @ [phone] Records sent via mail to: [facility]  [address] [place], [place] [address]   Jerelene Redden, 03/31/2012, 2:31 PM

## 2012-04-20 ENCOUNTER — Encounter (HOSPITAL_COMMUNITY): Payer: Self-pay | Admitting: *Deleted

## 2012-04-20 ENCOUNTER — Emergency Department (HOSPITAL_COMMUNITY)
Admission: EM | Admit: 2012-04-20 | Discharge: 2012-04-20 | Disposition: A | Discharge status: Home / Self Care | Payer: Medicaid Other | Attending: Emergency Medicine | Admitting: Emergency Medicine

## 2012-04-20 ENCOUNTER — Emergency Department (HOSPITAL_COMMUNITY): Payer: Medicaid Other

## 2012-04-20 DIAGNOSIS — E7800 Pure hypercholesterolemia, unspecified: Secondary | ICD-10-CM | POA: Insufficient documentation

## 2012-04-20 DIAGNOSIS — F3289 Other specified depressive episodes: Secondary | ICD-10-CM | POA: Insufficient documentation

## 2012-04-20 DIAGNOSIS — Z8739 Personal history of other diseases of the musculoskeletal system and connective tissue: Secondary | ICD-10-CM | POA: Insufficient documentation

## 2012-04-20 DIAGNOSIS — F17200 Nicotine dependence, unspecified, uncomplicated: Secondary | ICD-10-CM | POA: Insufficient documentation

## 2012-04-20 DIAGNOSIS — Z79899 Other long term (current) drug therapy: Secondary | ICD-10-CM | POA: Insufficient documentation

## 2012-04-20 DIAGNOSIS — E119 Type 2 diabetes mellitus without complications: Secondary | ICD-10-CM | POA: Insufficient documentation

## 2012-04-20 DIAGNOSIS — IMO0002 Reserved for concepts with insufficient information to code with codable children: Secondary | ICD-10-CM | POA: Insufficient documentation

## 2012-04-20 DIAGNOSIS — I10 Essential (primary) hypertension: Secondary | ICD-10-CM | POA: Insufficient documentation

## 2012-04-20 DIAGNOSIS — K219 Gastro-esophageal reflux disease without esophagitis: Secondary | ICD-10-CM | POA: Insufficient documentation

## 2012-04-20 DIAGNOSIS — X500XXA Overexertion from strenuous movement or load, initial encounter: Secondary | ICD-10-CM | POA: Insufficient documentation

## 2012-04-20 DIAGNOSIS — Y9301 Activity, walking, marching and hiking: Secondary | ICD-10-CM | POA: Insufficient documentation

## 2012-04-20 DIAGNOSIS — Y9289 Other specified places as the place of occurrence of the external cause: Secondary | ICD-10-CM | POA: Insufficient documentation

## 2012-04-20 MED ORDER — TRAMADOL HCL 50 MG PO TABS
50.0000 mg | ORAL_TABLET | Freq: Once | ORAL | Status: AC
  Administered 2012-04-20: 50 mg via ORAL
  Filled 2012-04-20: qty 1

## 2012-04-20 NOTE — ED Notes (Signed)
Pt in from home c/o R knee pain. Sudden onset while walking. Denies known injury. Denies chronic pain.

## 2012-04-20 NOTE — Progress Notes (Signed)
CSW met with pt at bedside to discuss pt csw needs. Pt shared that he lives at [facility] that is sponsored by the [place]. Pt reports he lives in the [facility] on [phone] with no answer. CSW spoke with Carloton who is the resident manager at the [facility] house who stated patient can not return until patient is able to walk. Per [employer], he was directed by the nurse and nurse case manager stating that patient could not return until patients need is fixed due to patient needed to be independent while at this facility.   CSW discussed with patient nurse that patient was able to put pants on by himself and was able to sit on the side of the bed with legs hanging off the bed. Patient was able to demonstrate this for CSW.    CSW discussed with Caroloton that per discussion with EDP and discharge summary there is no evidence of fracture of dislocation and patient will need to follow up with orthopedist outpatient. Per physician patient is able to walk with knee sleeve and can use crutches if assist with patient pain. Carolton stated that patient can return to facility. Pt is being fitted for crutches. Pt was able to demonstrate safe mobility with crutches.   .No further Clinical Social Work needs, signing off.   Catha Gosselin, LCSWA  [phone] 04/20/2012 10:08pm

## 2012-04-20 NOTE — ED Notes (Signed)
Pt states he was walking with his cane in his room and his right knee popped twice  Pt states the pain was so bad it caused him to fall  Pt states he has pain in his knee since that goes behind the knee and down into his calf  Pt states he called for help and they called 911

## 2012-04-20 NOTE — ED Provider Notes (Signed)
History     CSN: [id number] Arrival date & time 04/20/12  1902 First MD Initiated Contact with Patient 04/20/12 2100      Chief Complaint  Patient presents with  . Knee Pain    HPI Pt was walking with his cane when he felt his right knee pop.  The pain was sharp and severe.  It radiated down his calf.  The pain increased with movement.  It also felt swollen.  Pt denies any falls.  No fever.  No leg swelling.  He sees a doctor at the [place] and is being referred to an orthopedic doctor.  Past Medical History  Diagnosis Date  . Arthritis   . Diabetes mellitus   . Hypertension   . Depression   . High cholesterol   . GERD (gastroesophageal reflux disease)   . Neuropathic pain     History reviewed. No pertinent past surgical history.  History reviewed. No pertinent family history.  History  Substance Use Topics  . Smoking status: Current Every Day Smoker    Types: Cigarettes  . Smokeless tobacco: Not on file  . 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    Allergies  Morphine and related  Home Medications   Current Outpatient Rx  Name  Route  Sig  Dispense  Refill  . divalproex (DEPAKOTE ER) 500 MG 24 hr tablet   Oral   Take 500-1,000 mg by mouth 2 (two) times daily. 1 tab in am, 2 tabs in pm.         . lisinopril (PRINIVIL,ZESTRIL) 10 MG tablet   Oral   Take 2 tablets (20 mg total) by mouth daily.   30 tablet   0   . metFORMIN (GLUCOPHAGE) 1000 MG tablet   Oral   Take 1 tablet (1,000 mg total) by mouth 2 (two) times daily with a meal.   60 tablet   0   . metoprolol tartrate (LOPRESSOR) 25 MG tablet   Oral   Take 1 tablet (25 mg total) by mouth 2 (two) times daily.   30 tablet   0   . mirtazapine (REMERON) 45 MG tablet   Oral   Take 1 tablet (45 mg total) by mouth at bedtime.   30 tablet   0   . omeprazole (PRILOSEC OTC) 20 MG tablet   Oral   Take 1 tablet  (20 mg total) by mouth daily before breakfast.   30 tablet   0   . QUEtiapine (SEROQUEL) 100 MG tablet   Oral   Take 200 mg by mouth at bedtime.         Marland Kitchen QUEtiapine (SEROQUEL) 100 MG tablet   Oral   Take 50 mg by mouth 4 (four) times daily as needed (for mood stabilization.).         Marland Kitchen simvastatin (ZOCOR) 80 MG tablet   Oral   Take 1 tablet (80 mg total) by mouth at bedtime.   30 tablet   0   . traMADol (ULTRAM) 50 MG tablet   Oral   Take 1 tablet (50 mg total) by mouth every 6 (six) hours as needed for pain.   30 tablet   0     BP 101/74  Pulse 114  Temp(Src) 98.1 F (36.7 C) (Oral)  Resp 20  SpO2 97%  Physical Exam  Nursing note  and vitals reviewed. Constitutional: He appears well-developed and well-nourished. No distress.  HENT:  Head: Normocephalic and atraumatic.  Right Ear: External ear normal.  Left Ear: External ear normal.  Eyes: Conjunctivae are normal. Right eye exhibits no discharge. Left eye exhibits no discharge. No scleral icterus.  Neck: Neck supple. No tracheal deviation present.  Cardiovascular: Normal rate.   Pulmonary/Chest: Effort normal. No stridor. No respiratory distress.  Musculoskeletal: He exhibits no edema.       Right knee: He exhibits bony tenderness and MCL laxity. He exhibits normal range of motion, no swelling, no effusion, normal alignment and no LCL laxity. Tenderness found. No medial joint line and no lateral joint line tenderness noted.  Neurological: He is alert. Cranial nerve deficit: no gross deficits.  Skin: Skin is warm and dry. No rash noted.  Psychiatric: He has a normal mood and affect.    ED Course  Procedures (including critical care time)  Labs Reviewed - No data to display Dg Knee Complete 4 Views Right  04/20/2012  *RADIOLOGY REPORT*  Clinical Data: Knee pain.  The patient felt a pop.  RIGHT KNEE - COMPLETE 4+ VIEW  Comparison: Plain films 02/01/2011.  Findings: There is no acute bony or joint abnormality.  No joint effusion is seen.  The patient has tricompartmental osteoarthritis which appears worst in the patellofemoral compartment.  A large loose body is seen in the posterior aspect of the joint measuring 1.7 cm.  The appearance of the knee is not markedly changed.  IMPRESSION: No acute finding.  No notable change in tricompartmental degenerative disease.   Original Report Authenticated By: Holley Dexter, M.D.       MDM  Patient does not have evidence of fracture or dislocation. I will give him a tramadol while he is here in the emergency room. Patient is being referred to an orthopedist by his doctor at the [place].       Celene Kras, MD 04/20/12 2113

## 2012-11-03 ENCOUNTER — Encounter (HOSPITAL_COMMUNITY): Payer: Self-pay

## 2012-11-03 ENCOUNTER — Encounter (HOSPITAL_COMMUNITY): Payer: Self-pay | Admitting: *Deleted

## 2012-11-03 ENCOUNTER — Emergency Department (HOSPITAL_COMMUNITY)
Admission: EM | Admit: 2012-11-03 | Discharge: 2012-11-03 | Disposition: A | Discharge status: Other Acute Inpatient Hospital | Payer: Medicaid Other | Attending: Emergency Medicine | Admitting: Emergency Medicine

## 2012-11-03 ENCOUNTER — Inpatient Hospital Stay (HOSPITAL_COMMUNITY)
Admission: AD | Admit: 2012-11-03 | Discharge: 2012-11-07 | DRG: 885 | Disposition: A | Discharge status: Home / Self Care | Payer: Medicaid Other | Source: Intra-hospital | Attending: Psychiatry | Admitting: Psychiatry

## 2012-11-03 DIAGNOSIS — Z79899 Other long term (current) drug therapy: Secondary | ICD-10-CM | POA: Insufficient documentation

## 2012-11-03 DIAGNOSIS — M129 Arthropathy, unspecified: Secondary | ICD-10-CM | POA: Insufficient documentation

## 2012-11-03 DIAGNOSIS — K219 Gastro-esophageal reflux disease without esophagitis: Secondary | ICD-10-CM | POA: Diagnosis present

## 2012-11-03 DIAGNOSIS — R45851 Suicidal ideations: Secondary | ICD-10-CM

## 2012-11-03 DIAGNOSIS — Z8669 Personal history of other diseases of the nervous system and sense organs: Secondary | ICD-10-CM | POA: Insufficient documentation

## 2012-11-03 DIAGNOSIS — F333 Major depressive disorder, recurrent, severe with psychotic symptoms: Principal | ICD-10-CM

## 2012-11-03 DIAGNOSIS — Z91148 Patient's other noncompliance with medication regimen for other reason: Secondary | ICD-10-CM

## 2012-11-03 DIAGNOSIS — Z8719 Personal history of other diseases of the digestive system: Secondary | ICD-10-CM | POA: Insufficient documentation

## 2012-11-03 DIAGNOSIS — I10 Essential (primary) hypertension: Secondary | ICD-10-CM | POA: Insufficient documentation

## 2012-11-03 DIAGNOSIS — E7800 Pure hypercholesterolemia, unspecified: Secondary | ICD-10-CM | POA: Insufficient documentation

## 2012-11-03 DIAGNOSIS — E119 Type 2 diabetes mellitus without complications: Secondary | ICD-10-CM | POA: Insufficient documentation

## 2012-11-03 DIAGNOSIS — Z9114 Patient's other noncompliance with medication regimen: Secondary | ICD-10-CM

## 2012-11-03 DIAGNOSIS — R000 Tachycardia, unspecified: Secondary | ICD-10-CM | POA: Insufficient documentation

## 2012-11-03 DIAGNOSIS — F4310 Post-traumatic stress disorder, unspecified: Secondary | ICD-10-CM

## 2012-11-03 DIAGNOSIS — F17200 Nicotine dependence, unspecified, uncomplicated: Secondary | ICD-10-CM | POA: Insufficient documentation

## 2012-11-03 DIAGNOSIS — R443 Hallucinations, unspecified: Secondary | ICD-10-CM | POA: Insufficient documentation

## 2012-11-03 DIAGNOSIS — F319 Bipolar disorder, unspecified: Secondary | ICD-10-CM

## 2012-11-03 HISTORY — DX: Malignant (primary) neoplasm, unspecified: C80.1

## 2012-11-03 LAB — CBC
MCH: 29.8 pg (ref 26.0–34.0)
MCHC: 36.6 g/dL — ABNORMAL HIGH (ref 30.0–36.0)
Platelets: 176 10*3/uL (ref 150–400)
RDW: 13.4 % (ref 11.5–15.5)

## 2012-11-03 LAB — COMPREHENSIVE METABOLIC PANEL
ALT: 13 U/L (ref 0–53)
AST: 13 U/L (ref 0–37)
Albumin: 4.2 g/dL (ref 3.5–5.2)
Calcium: 10.1 mg/dL (ref 8.4–10.5)
GFR calc Af Amer: >90 mL/min (ref >90)
Glucose, Bld: 470 mg/dL — ABNORMAL HIGH (ref 70–99)
Potassium: 4 mEq/L (ref 3.5–5.1)
Sodium: 132 mEq/L — ABNORMAL LOW (ref 135–145)
Total Protein: 7.9 g/dL (ref 6.0–8.3)

## 2012-11-03 LAB — RAPID URINE DRUG SCREEN, HOSP PERFORMED
Amphetamines: NOT DETECTED
Cocaine: NOT DETECTED
Opiates: NOT DETECTED

## 2012-11-03 LAB — GLUCOSE, CAPILLARY: Glucose-Capillary: 249 mg/dL — ABNORMAL HIGH (ref 70–99)

## 2012-11-03 LAB — SALICYLATE LEVEL: Salicylate Lvl: <2 mg/dL — ABNORMAL LOW (ref 2.8–20.0)

## 2012-11-03 MED ORDER — DIVALPROEX SODIUM 250 MG PO DR TAB: 1000.0000 mg | DELAYED_RELEASE_TABLET | Freq: Every day | ORAL | Status: DC

## 2012-11-03 MED ORDER — METFORMIN HCL 500 MG PO TABS: 1000.0000 mg | ORAL_TABLET | Freq: Two times a day (BID) | ORAL | Status: DC

## 2012-11-03 MED ORDER — BUSPIRONE HCL 10 MG PO TABS
5.0000 mg | ORAL_TABLET | Freq: Three times a day (TID) | ORAL | Status: DC
  Administered 2012-11-03: 5 mg via ORAL
  Filled 2012-11-03: qty 1

## 2012-11-03 MED ORDER — LISINOPRIL 20 MG PO TABS
20.0000 mg | ORAL_TABLET | Freq: Every day | ORAL | Status: DC
  Administered 2012-11-04 – 2012-11-07 (×4): 20 mg via ORAL
  Filled 2012-11-03 (×7): qty 1

## 2012-11-03 MED ORDER — LORAZEPAM 1 MG PO TABS: 1.0000 mg | ORAL_TABLET | Freq: Three times a day (TID) | ORAL | Status: DC | PRN

## 2012-11-03 MED ORDER — METOPROLOL TARTRATE 25 MG PO TABS
25.0000 mg | ORAL_TABLET | Freq: Two times a day (BID) | ORAL | Status: DC
  Administered 2012-11-04 – 2012-11-07 (×7): 25 mg via ORAL
  Filled 2012-11-03 (×11): qty 1

## 2012-11-03 MED ORDER — MIRTAZAPINE 45 MG PO TABS
45.0000 mg | ORAL_TABLET | Freq: Every day | ORAL | Status: DC
  Administered 2012-11-04: 45 mg via ORAL
  Filled 2012-11-03 (×4): qty 1

## 2012-11-03 MED ORDER — LISINOPRIL 20 MG PO TABS
20.0000 mg | ORAL_TABLET | Freq: Every day | ORAL | Status: DC
  Administered 2012-11-03: 20 mg via ORAL
  Filled 2012-11-03: qty 1

## 2012-11-03 MED ORDER — ATORVASTATIN CALCIUM 40 MG PO TABS
40.0000 mg | ORAL_TABLET | Freq: Every day | ORAL | Status: DC
  Administered 2012-11-03: 40 mg via ORAL
  Filled 2012-11-03: qty 1

## 2012-11-03 MED ORDER — MAGNESIUM HYDROXIDE 400 MG/5ML PO SUSP: 30.0000 mL | Freq: Every day | ORAL | Status: DC | PRN

## 2012-11-03 MED ORDER — QUETIAPINE FUMARATE 25 MG PO TABS: 100.0000 mg | ORAL_TABLET | Freq: Every day | ORAL | Status: DC

## 2012-11-03 MED ORDER — METOPROLOL TARTRATE 25 MG PO TABS
25.0000 mg | ORAL_TABLET | Freq: Two times a day (BID) | ORAL | Status: DC
  Administered 2012-11-03: 25 mg via ORAL
  Filled 2012-11-03 (×2): qty 1

## 2012-11-03 MED ORDER — GLIPIZIDE 5 MG PO TABS
5.0000 mg | ORAL_TABLET | Freq: Two times a day (BID) | ORAL | Status: DC
  Administered 2012-11-03: 5 mg via ORAL
  Filled 2012-11-03 (×2): qty 1

## 2012-11-03 MED ORDER — DIVALPROEX SODIUM ER 500 MG PO TB24
1000.0000 mg | ORAL_TABLET | Freq: Every day | ORAL | Status: DC
  Filled 2012-11-03 (×2): qty 2

## 2012-11-03 MED ORDER — QUETIAPINE FUMARATE 100 MG PO TABS
100.0000 mg | ORAL_TABLET | Freq: Every day | ORAL | Status: DC
  Administered 2012-11-04 – 2012-11-06 (×3): 100 mg via ORAL
  Filled 2012-11-03 (×5): qty 1
  Filled 2012-11-03: qty 3
  Filled 2012-11-03: qty 1

## 2012-11-03 MED ORDER — ZOLPIDEM TARTRATE 5 MG PO TABS: 5.0000 mg | ORAL_TABLET | Freq: Every evening | ORAL | Status: DC | PRN

## 2012-11-03 MED ORDER — ALUM & MAG HYDROXIDE-SIMETH 200-200-20 MG/5ML PO SUSP: 30.0000 mL | ORAL | Status: DC | PRN

## 2012-11-03 MED ORDER — METFORMIN HCL 500 MG PO TABS
1000.0000 mg | ORAL_TABLET | Freq: Two times a day (BID) | ORAL | Status: DC
  Administered 2012-11-03: 1000 mg via ORAL
  Filled 2012-11-03 (×2): qty 2

## 2012-11-03 MED ORDER — METFORMIN HCL 500 MG PO TABS
1000.0000 mg | ORAL_TABLET | Freq: Two times a day (BID) | ORAL | Status: DC
  Administered 2012-11-04 – 2012-11-07 (×7): 1000 mg via ORAL
  Filled 2012-11-03 (×13): qty 2

## 2012-11-03 MED ORDER — MIRTAZAPINE 45 MG PO TABS
45.0000 mg | ORAL_TABLET | Freq: Every day | ORAL | Status: DC
  Filled 2012-11-03: qty 1

## 2012-11-03 MED ORDER — ATORVASTATIN CALCIUM 40 MG PO TABS
40.0000 mg | ORAL_TABLET | Freq: Every day | ORAL | Status: DC
  Administered 2012-11-04 – 2012-11-07 (×4): 40 mg via ORAL
  Filled 2012-11-03 (×6): qty 1

## 2012-11-03 MED ORDER — BUSPIRONE HCL 5 MG PO TABS
5.0000 mg | ORAL_TABLET | Freq: Three times a day (TID) | ORAL | Status: DC
  Administered 2012-11-04 – 2012-11-05 (×5): 5 mg via ORAL
  Filled 2012-11-03 (×10): qty 1

## 2012-11-03 MED ORDER — ACETAMINOPHEN 325 MG PO TABS: 650.0000 mg | ORAL_TABLET | ORAL | Status: DC | PRN

## 2012-11-03 MED ORDER — ACETAMINOPHEN 325 MG PO TABS: 650.0000 mg | ORAL_TABLET | Freq: Four times a day (QID) | ORAL | Status: DC | PRN

## 2012-11-03 MED ORDER — SODIUM CHLORIDE 0.9 % IV BOLUS (SEPSIS)
2000.0000 mL | Freq: Once | INTRAVENOUS | Status: AC
  Administered 2012-11-03: 2000 mL via INTRAVENOUS

## 2012-11-03 MED ORDER — DIVALPROEX SODIUM 500 MG PO DR TAB
500.0000 mg | DELAYED_RELEASE_TABLET | Freq: Every morning | ORAL | Status: DC
  Administered 2012-11-04 – 2012-11-05 (×2): 500 mg via ORAL
  Filled 2012-11-03 (×4): qty 1

## 2012-11-03 MED ORDER — DIVALPROEX SODIUM 250 MG PO DR TAB
500.0000 mg | DELAYED_RELEASE_TABLET | Freq: Every morning | ORAL | Status: DC
  Administered 2012-11-03: 500 mg via ORAL
  Filled 2012-11-03: qty 2

## 2012-11-03 MED ORDER — GLIPIZIDE 5 MG PO TABS
5.0000 mg | ORAL_TABLET | Freq: Every day | ORAL | Status: DC
  Administered 2012-11-04 – 2012-11-07 (×4): 5 mg via ORAL
  Filled 2012-11-03 (×5): qty 1

## 2012-11-03 MED ORDER — NICOTINE 21 MG/24HR TD PT24
21.0000 mg | MEDICATED_PATCH | Freq: Every day | TRANSDERMAL | Status: DC
  Administered 2012-11-03: 21 mg via TRANSDERMAL
  Filled 2012-11-03: qty 1

## 2012-11-03 NOTE — ED Notes (Signed)
AC notified for sitter.  No one available at this time.  Pt able to contract for safety.  Door left ajar for safety.

## 2012-11-03 NOTE — ED Notes (Signed)
CALLED LAB. THEY WILL ADD ON VALPROIC ACID LEVEL TO BLOOD IN LAB

## 2012-11-03 NOTE — ED Notes (Signed)
Pt presents to ED with depression, increase stress, insomnia, and nightmares x1 week. Pt reports he has a hx of the same and his symptoms are consistent with his hx of depression. Pt also feels like he needs his mental health medication evaluated, states "I think I may be getting used to them." pt also states "I don't think I'm suicidal but I feel like it's borderline. 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 Pt is now living on his own, feels like his medications upsets his stomach, also having increase financial strain.

## 2012-11-03 NOTE — Progress Notes (Signed)
Patient ID: [name], male   DOB: 1956/01/07, 57 y.o.   MRN: [id number] Pt admitted voluntarily to [facility] d/t increased depression.  Pt states that he has been experiencing financial difficulty and family issues.  Pt did not elaborate on family stresses.  Pt stated he had been trying to "speed things up" (resolving family matters) and became overwhelmed.  Pt stated he began to have bad dreams and crying spells.  Pt stated within the past week he began to feel as though someone was watching tv with him when no one was present.  Pt stated he has never experienced this in the past.  Pt stated in the past week he did briefly have thoughts of harming himself but without a plan.  Denied suicidal ideation at the time of admission.  Pt uses a cane to assist with ambulation d/t problems with R knee and neuropathy.  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  Fifteen minute checks initiated.  Pt re-oriented to unit.  Pt safe on unit.

## 2012-11-03 NOTE — ED Notes (Signed)
Spoke with [facility]. They advise pt glucose of 470 is too high to come. They are aware we are treating his blood sugar now. They require 2 cbgs below 250 4 hours apart times for admit to [facility]. They are going to hold his bed. They ask that we keep communication open
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
## 2012-11-03 NOTE — ED Notes (Signed)
TTS IN PROGRESS 

## 2012-11-03 NOTE — ED Provider Notes (Signed)
CSN: [id number]     Arrival date & time 11/03/12  1059 History   First MD Initiated Contact with Patient 11/03/12 1208     Chief Complaint  Patient presents with  . Depression   (Consider location/radiation/quality/duration/timing/severity/associated sxs/prior Treatment) HPI Patient reports feeling depressed, for the past 2 weeks. Having mood swings being extremely energetic or extremely "low-energy" for approximately the past 2 weeks also has thoughts of suicide and presently feels unsafe at home feeling that he may harm himself. He denies homicidal ideation. Denies noncompliance with medications. He also has vague visual hallucinations stating that when he watches television he feels somebody is sitting next to him and sees bag shadows out of the corner of his eye. No other complaint. Past Medical History  Diagnosis Date  . Arthritis   . Diabetes mellitus   . Hypertension   . Depression   . High cholesterol   . GERD (gastroesophageal reflux disease)   . Neuropathic pain    History reviewed. No pertinent past surgical history. History reviewed. No pertinent family history. History  Substance Use Topics  . Smoking status: Current Every Day Smoker    Types: Cigarettes  . Smokeless tobacco: Not on file  . Alcohol Use: No     Comment: Pt has been clean for 8 months    no alcohol or illicit drugs for the past one year 3 months. Review of Systems  Constitutional: Negative.   HENT: Negative.   Respiratory: Negative.   Cardiovascular: Negative.   Gastrointestinal: Negative.   Musculoskeletal: Negative.   Skin: Negative.   Neurological: Negative.        Walks with CHRONIC LIMP  Psychiatric/Behavioral: Positive for suicidal ideas, hallucinations and dysphoric mood.  All other systems reviewed and are negative.    Allergies  Morphine and related  Home Medications   Current Outpatient Rx  Name  Route  Sig  Dispense  Refill  . atorvastatin (LIPITOR) 80 MG tablet   Oral    Take 40 mg by mouth daily.         . busPIRone (BUSPAR) 5 MG tablet   Oral   Take 5 mg by mouth 3 (three) times daily.         . divalproex (DEPAKOTE) 500 MG DR tablet   Oral   Take 500-1,000 mg by mouth. Takes 1 tablet every morning and takes 2 tablets every night at bedtime.         Marland Kitchen glipiZIDE (GLUCOTROL) 5 MG tablet   Oral   Take 5 mg by mouth 2 (two) times daily before a meal.         . lisinopril (PRINIVIL,ZESTRIL) 20 MG tablet   Oral   Take 20 mg by mouth daily.         . metFORMIN (GLUCOPHAGE) 1000 MG tablet   Oral   Take 1,000 mg by mouth 2 (two) times daily with a meal.         . metoprolol (LOPRESSOR) 50 MG tablet   Oral   Take 25 mg by mouth 2 (two) times daily.         . mirtazapine (REMERON) 45 MG tablet   Oral   Take 45 mg by mouth at bedtime.         Marland Kitchen QUEtiapine (SEROQUEL) 100 MG tablet   Oral   Take 100 mg by mouth at bedtime.         . traMADol (ULTRAM) 50 MG tablet   Oral   Take  50 mg by mouth every 6 (six) hours as needed for pain.          BP 150/94  Pulse 124  Temp(Src) 99 F (37.2 C) (Oral)  Resp 16  SpO2 96% Physical Exam  Nursing note and vitals reviewed. Constitutional: He is oriented to person, place, and time. He appears well-developed and well-nourished.  HENT:  Head: Normocephalic and atraumatic.  Eyes: Conjunctivae are normal. Pupils are equal, round, and reactive to light.  Neck: Neck supple. No tracheal deviation present. No thyromegaly present.  Cardiovascular: Regular rhythm.   No murmur heard. Mildly tachycardic count at 108 by me  Pulmonary/Chest: Effort normal and breath sounds normal.  Abdominal: Soft. Bowel sounds are normal. He exhibits no distension. There is no tenderness.  Musculoskeletal: Normal range of motion. He exhibits no edema and no tenderness.  Neurological: He is alert and oriented to person, place, and time. Coordination normal.  Walks with a limp favoring left lower extremitTY   Skin: Skin is warm and dry. No rash noted.  Psychiatric: He has a normal mood and affect.    ED Course  Procedures (including critical care time) Labs Review Labs Reviewed  CBC  COMPREHENSIVE METABOLIC PANEL  ETHANOL  ACETAMINOPHEN LEVEL  SALICYLATE LEVEL  URINE RAPID DRUG SCREEN (HOSP PERFORMED)   Date: 11/03/2012  Rate: 105  Rhythm: sinus tachycardia  QRS Axis: normal  Intervals: normal  ST/T Wave abnormalities: nonspecific T wave changes  Conduction Disutrbances:none  Narrative Interpretation:   Old EKG Reviewed: Rate has decreased since 08/16/2011 otherwise no significant changes as interpreted by me Results for orders placed during the hospital encounter of 11/03/12  CBC      Result Value Range   WBC 4.7  4.0 - 10.5 K/uL   RBC 5.47  4.22 - 5.81 MIL/uL   Hemoglobin 16.3  13.0 - 17.0 g/dL   HCT 09.8  11.9 - 14.7 %   MCV 81.4  78.0 - 100.0 fL   MCH 29.8  26.0 - 34.0 pg   MCHC 36.6 (*) 30.0 - 36.0 g/dL   RDW 82.9  56.2 - 13.0 %   Platelets 176  150 - 400 K/uL  COMPREHENSIVE METABOLIC PANEL      Result Value Range   Sodium 132 (*) 135 - 145 mEq/L   Potassium 4.0  3.5 - 5.1 mEq/L   Chloride 94 (*) 96 - 112 mEq/L   CO2 23  19 - 32 mEq/L   Glucose, Bld 470 (*) 70 - 99 mg/dL   BUN 17  6 - 23 mg/dL   Creatinine, Ser 8.65  0.50 - 1.35 mg/dL   Calcium 78.4  8.4 - 69.6 mg/dL   Total Protein 7.9  6.0 - 8.3 g/dL   Albumin 4.2  3.5 - 5.2 g/dL   AST 13  0 - 37 U/L   ALT 13  0 - 53 U/L   Alkaline Phosphatase 95  39 - 117 U/L   Total Bilirubin 0.3  0.3 - 1.2 mg/dL   GFR calc non Af Amer >90  >90 mL/min   GFR calc Af Amer >90  >90 mL/min  ETHANOL      Result Value Range   Alcohol, Ethyl (B) <11  0 - 11 mg/dL  ACETAMINOPHEN LEVEL      Result Value Range   Acetaminophen (Tylenol), Serum <15.0  10 - 30 ug/mL  SALICYLATE LEVEL      Result Value Range   Salicylate Lvl <2.0 (*) 2.8 -  20.0 mg/dL  URINE RAPID DRUG SCREEN (HOSP PERFORMED)      Result Value Range   Opiates  NONE DETECTED  NONE DETECTED   Cocaine NONE DETECTED  NONE DETECTED   Benzodiazepines NONE DETECTED  NONE DETECTED   Amphetamines NONE DETECTED  NONE DETECTED   Tetrahydrocannabinol NONE DETECTED  NONE DETECTED   Barbiturates NONE DETECTED  NONE DETECTED  VALPROIC ACID LEVEL      Result Value Range   Valproic Acid Lvl <10.0 (*) 50.0 - 100.0 ug/mL  GLUCOSE, CAPILLARY      Result Value Range   Glucose-Capillary 236 (*) 70 - 99 mg/dL   Comment 1 Documented in Chart     Comment 2 Notify RN    GLUCOSE, CAPILLARY      Result Value Range   Glucose-Capillary 189 (*) 70 - 99 mg/dL   Comment 1 Documented in Chart     Comment 2 Notify RN     No results found.  Imaging Review No results found. MDM  No diagnosis found. Patient admits to noncompliance with his diabetic medications. This is likely the cause of his hyperglycemia today. Pt transferrred to [facility] Dr. Henrene Hawking accepting MD Dx#1 suicidal ideations #2 hyperglycemia #3 medication non compliance   Doug Sou, MD 11/03/12 2130

## 2012-11-03 NOTE — Tx Team (Signed)
Initial Interdisciplinary Treatment Plan  PATIENT STRENGTHS: (choose at least two) Ability for insight Average or above average intelligence Capable of independent living Communication skills General fund of knowledge Motivation for treatment/growth Supportive family/friends  PATIENT STRESSORS: Financial difficulties Health problems Marital or family conflict   PROBLEM LIST: Problem List/Patient Goals Date to be addressed Date deferred Reason deferred Estimated date of resolution  depression 11/03/12     Suicidal ideation 11/03/12     Self esteem 11/03/12                                          DISCHARGE CRITERIA:  Motivation to continue treatment in a less acute level of care Need for constant or close observation no longer present Verbal commitment to aftercare and medication compliance  PRELIMINARY DISCHARGE PLAN: Participate in family therapy Placement in alternative living arrangements Return to previous living arrangement  PATIENT/FAMIILY INVOLVEMENT: This treatment plan has been presented to and reviewed with the patient, Talis Iwan, .  The patient and family have been given the opportunity to ask questions and make suggestions.  Hoover Browns 11/03/2012, 10:16 PM

## 2012-11-03 NOTE — ED Notes (Addendum)
Pt states he has his own apartment and lives alone.  Hx bipolar disorder.  He has been depressed since Tuesday.  He does not feel safe at home alone.  He thinks maybe his medications need to be adjusted.  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 He takes tramadol for neuropathy and walks with a cane.

## 2012-11-04 DIAGNOSIS — F4310 Post-traumatic stress disorder, unspecified: Secondary | ICD-10-CM

## 2012-11-04 DIAGNOSIS — F333 Major depressive disorder, recurrent, severe with psychotic symptoms: Secondary | ICD-10-CM

## 2012-11-04 DIAGNOSIS — R45851 Suicidal ideations: Secondary | ICD-10-CM

## 2012-11-04 DIAGNOSIS — F332 Major depressive disorder, recurrent severe without psychotic features: Secondary | ICD-10-CM

## 2012-11-04 LAB — GLUCOSE, CAPILLARY: Glucose-Capillary: 158 mg/dL — ABNORMAL HIGH (ref 70–99)

## 2012-11-04 MED ORDER — DIVALPROEX SODIUM 500 MG PO DR TAB
1000.0000 mg | DELAYED_RELEASE_TABLET | Freq: Every day | ORAL | Status: DC
  Administered 2012-11-04: 1000 mg via ORAL
  Filled 2012-11-04 (×3): qty 2

## 2012-11-04 NOTE — Progress Notes (Signed)
The focus of this group is to help patients review their daily goal of treatment and discuss progress on daily workbooks. Pt attended the evening group session and responded to all discussion prompts from the Writer. Pt reported having a good day on the unit, largely due to Nursing Staff and his peers making him feel comfortable about speaking up. Pt shared that he was initially hesitant to talk in the groups, but that he felt easier doing so after seeing his peers open up and share their stories. Pt's affect was bright and he volunteered several encouraging comments to his fellow Pt's.

## 2012-11-04 NOTE — Clinical Social Work Note (Signed)
CSW retrieved two shirts and two pairs of pants from clothing closet for patient.  Ambrose Mantle, LCSW 11/04/2012, 4:54 PM

## 2012-11-04 NOTE — [facility] Group Notes (Signed)
[facility] Group Notes:  (Clinical Social Work)  10/07/2012   3:00-4:00PM  Summary of Progress/Problems:   The main focus of today's process group was for the patient to identify ways in which they have sabotaged their own mental health wellness/recovery.  There was a discussion initially about the concept of recovery, and what that means to different patients.  Motivational interviewing was used to explore the reasons they engage in behavior that is detrimental to recovery, and reasons for wanting to change.  Patients were asked to rate their motivation to change their self-sabotaging behaviors on a scale of 0 (no motivation) to 10 (willing to do whatever is recommended).   The patient expressed that he self sabotages with shame and guilt, and lies to himself about reality because he does not want to have to face reality.  His motivation is "8" currently and he states it would be higher except that he has been here before and knows that he is "gung ho" at first and can get easily discouraged if he meets with failure.  Type of Therapy:  Process Group  Participation Level:  Active  Participation Quality:  Attentive and Sharing  Affect:  Appropriate  Cognitive:  Oriented  Insight:  Developing/Improving  Engagement in Therapy:  Engaged  Modes of Intervention:  Education, Motivational Interviewing   Ambrose Mantle, LCSW 4:52 PM

## 2012-11-04 NOTE — Progress Notes (Signed)
Patient came to medication window requesting his hs medications. Patient reports his depression at a 6 which is alittle better that how he felt this morning. Patient c/o rt knee pain and he reports that he normally takes tramadol but it doesn't help. Patient reports not able to take narcotics and writer offered him heat pack which he declined also. Writer encouraged patient to speak with his doctor on tomorrow to see if there is a medication that may be helpful. Patient attended group this evening and has been observed up in the dayroom watching tv with peers. Support and encouragement offered, he denies si/hi/a/v hallucinations. Safety maintained on unit with 15 min checks, will continue to monitor.

## 2012-11-04 NOTE — Progress Notes (Signed)
D: Pt denies SI/HI/AV. Pt is pleasant and cooperative. Pt rates depression at a 8, anxiety at a 2, and Helplessness/hopelessness at a 7.  A: Pt was offered support and encouragement. Pt was given scheduled medications. Pt was encourage to attend groups. Q 15 minute checks were done for safety.  R:Pt attends groups and interacts well with peers and staff. Pt taking medication. Pt has no complaints.Pt receptive to treatment and safety maintained on unit.
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
## 2012-11-05 MED ORDER — DIVALPROEX SODIUM 500 MG PO DR TAB
1500.0000 mg | DELAYED_RELEASE_TABLET | Freq: Every day | ORAL | Status: DC
  Administered 2012-11-05 – 2012-11-06 (×2): 1500 mg via ORAL
  Filled 2012-11-05: qty 9
  Filled 2012-11-05 (×5): qty 3

## 2012-11-05 MED ORDER — MIRTAZAPINE 30 MG PO TABS
30.0000 mg | ORAL_TABLET | Freq: Every day | ORAL | Status: DC
  Administered 2012-11-05 – 2012-11-06 (×2): 30 mg via ORAL
  Filled 2012-11-05 (×4): qty 1
  Filled 2012-11-05: qty 3

## 2012-11-05 NOTE — Progress Notes (Signed)
Adult Psychoeducational Group Note  Date:  11/05/2012 Time:  0900  Group Topic/Focus:  Spirituality:   The focus of this group is to discuss how one's spirituality can aide in recovery.  Participation Level:  Did Not Attend  Pt decided not to attend   [name] [name] 11/05/2012, 12:10 PM

## 2012-11-05 NOTE — Progress Notes (Signed)
Adult Psychoeducational Group Note  Date:  11/05/2012 Time:  8:00pm Group Topic/Focus:  Wrap-Up Group:   The focus of this group is to help patients review their daily goal of treatment and discuss progress on daily workbooks.  Participation Level:  Active  Participation Quality:  Appropriate and Attentive  Affect:  Appropriate  Cognitive:  Alert and Appropriate  Insight: Appropriate and Good  Engagement in Group:  Engaged  Modes of Intervention:  Discussion and Education  Additional Comments:  Pt attended and participated in group. When ask how did his day go pt stated he wasn't feeling well due to his meds but did state that the doctor changed them and said he should be feeling better tomorrow. When asked what his goal was for tomorrow pt stated to feel better than today.  Shelly Bombard D 11/05/2012, 9:03 PM

## 2012-11-05 NOTE — Progress Notes (Signed)
[facility] MD Progress Note  11/05/2012 2:56 PM [name]  MRN:  [id number]  Subjective:  Patient stated that he is not feeling good, just don't feel motivated. Patient reported he might have been taking too much medication at nighttime which making him groggy in the morning time. Patient reports his anxiety is 4/10 in depression 6/10. Patient has ongoing suicidal ideation without specific intervention intention or Plan. Patient consented for the change of medication for better mood and anxiety   Diagnosis:   DSM5: Schizophrenia Disorders:   Obsessive-Compulsive Disorders:   Trauma-Stressor Disorders:  Posttraumatic Stress Disorder (309.81) Substance/Addictive Disorders:   Depressive Disorders:  Major Depressive Disorder - Severe (296.23)  Axis I: Major Depression, Recurrent severe and Post Traumatic Stress Disorder  ADL's:  Impaired  Sleep: Fair  Appetite:  Fair  Suicidal Ideation:  Endorses suicidal ideation, intention and plans but contract for safety in the hospital Homicidal Ideation:  Denied AEB (as evidenced by):  Psychiatric Specialty Exam: ROS  Blood pressure 141/88, pulse 108, temperature 97.9 F (36.6 C), temperature source Oral, resp. rate 16, height 5' 9.25" (1.759 m), weight 92.534 kg (204 lb).Body mass index is 29.91 kg/(m^2).  General Appearance: Disheveled and Guarded  Eye Solicitor::  Fair  Speech:  Clear and Coherent  Volume:  Decreased  Mood:  Anxious, Depressed, Hopeless and Worthless  Affect:  Depressed and Flat  Thought Process:  Intact  Orientation:  Full (Time, Place, and Person)  Thought Content:  Rumination  Suicidal Thoughts:  Yes.  with intent/plan  Homicidal Thoughts:  No  Memory:  Immediate;   Fair  Judgement:  Impaired  Insight:  Lacking  Psychomotor Activity:  Psychomotor Retardation  Concentration:  Fair  Recall:  Fair  Akathisia:  NA  Handed:  Right  AIMS (if indicated):     Assets:  Communication Skills Desire for  Improvement Housing Physical Health Resilience Social Support Transportation  Sleep:  Number of Hours: 6.75   Current Medications: Current Facility-Administered Medications  Medication Dose Route Frequency Provider Last Rate Last Dose  . acetaminophen (TYLENOL) tablet 650 mg  650 mg Oral Q6H PRN Kevin Joy, PA-C      . alum & mag hydroxide-simeth (MAALOX/MYLANTA) 200-200-20 MG/5ML suspension 30 mL  30 mL Oral Q4H PRN Kevin Joy, PA-C      . atorvastatin (LIPITOR) tablet 40 mg  40 mg Oral Daily Kevin Joy, PA-C   40 mg at 11/05/12 0809  . divalproex (DEPAKOTE) DR tablet 1,500 mg  1,500 mg Oral QHS Kevin Settle, MD      . glipiZIDE (GLUCOTROL) tablet 5 mg  5 mg Oral QAC lunch Kevin Joy, PA-C   5 mg at 11/05/12 1200  . lisinopril (PRINIVIL,ZESTRIL) tablet 20 mg  20 mg Oral Daily Kevin Joy, PA-C   20 mg at 11/05/12 0809  . magnesium hydroxide (MILK OF MAGNESIA) suspension 30 mL  30 mL Oral Daily PRN Kevin Joy, PA-C      . metFORMIN (GLUCOPHAGE) tablet 1,000 mg  1,000 mg Oral BID WC Kevin Joy, PA-C   1,000 mg at 11/05/12 4098  . metoprolol tartrate (LOPRESSOR) tablet 25 mg  25 mg Oral BID Kevin Joy, PA-C   25 mg at 11/05/12 0809  . mirtazapine (REMERON) tablet 30 mg  30 mg Oral QHS Kevin Settle, MD      . QUEtiapine (SEROQUEL) tablet 100 mg  100 mg Oral QHS Kevin Joy, PA-C   100 mg  at 11/04/12 2110    Lab Results:  Results for orders placed during the hospital encounter of 11/03/12 (from the past 48 hour(s))  GLUCOSE, CAPILLARY     Status: Abnormal   Collection Time    11/03/12  9:38 PM      Result Value Range   Glucose-Capillary 249 (*) 70 - 99 mg/dL  GLUCOSE, CAPILLARY     Status: Abnormal   Collection Time    11/04/12  6:19 AM      Result Value Range   Glucose-Capillary 158 (*) 70 - 99 mg/dL  GLUCOSE, CAPILLARY     Status: Abnormal   Collection Time    11/05/12 11:14 AM      Result Value Range    Glucose-Capillary 264 (*) 70 - 99 mg/dL    Physical Findings: AIMS: Facial and Oral Movements Muscles of Facial Expression: None, normal Lips and Perioral Area: None, normal Jaw: None, normal Tongue: None, normal,Extremity Movements Upper (arms, wrists, hands, fingers): None, normal Lower (legs, knees, ankles, toes): None, normal, Trunk Movements Neck, shoulders, hips: None, normal, Overall Severity Severity of abnormal movements (highest score from questions above): None, normal Incapacitation due to abnormal movements: None, normal Patient's awareness of abnormal movements (rate only patient's report): No Awareness, Dental Status Current problems with teeth and/or dentures?: Yes (back teeth) Does patient usually wear dentures?: No  CIWA:    COWS:     Treatment Plan Summary: Daily contact with patient to assess and evaluate symptoms and progress in treatment Medication management  Plan: Change Depakote to 1500 mg at bedtime Continuous circular 100 mg at bedtime Decrease Remeron to 30 mg at bedtime Treatment Plan/Recommendations:   1. Admit for crisis management and stabilization. 2. Medication management to reduce current symptoms to base line and improve the patient's overall level of functioning. 3. Treat health problems as indicated. 4. Develop treatment plan to decrease risk of relapse upon discharge and to reduce the need for readmission. 5. Psycho-social education regarding relapse prevention and self care. 6. Health care follow up as needed for medical problems. 7. Restart home medications where appropriate. 8. Disposition plans are in progress, may be discharged on Tuesday  Medical Decision Making Problem Points:  Established problem, worsening (2), Review of last therapy session (1) and Review of psycho-social stressors (1) Data Points:  Review or order clinical lab tests (1) Review or order medicine tests (1) Review of medication regiment & side effects  (2) Review of new medications or change in dosage (2)  I certify that inpatient services furnished can reasonably be expected to improve the patient's condition.   [name],Kevin R. 11/05/2012, 2:56 PM

## 2012-11-05 NOTE — Progress Notes (Signed)
NUTRITION ASSESSMENT  Pt identified as at risk on the Malnutrition Screen Tool  INTERVENTION: 1. Educated patient on the importance of nutrition and encouraged intake of food and beverages. 2. Discussed weight goals. 3.  Reviewed DM diet guidelines.  NUTRITION DIAGNOSIS: Patient not ready for diet lifestyle change related to depression and money issues AEB patient repsponse.  Goal: Pt to meet >/= 90% of their estimated nutrition needs.  Monitor:  PO intake  Assessment:  Patient admitted with depression, SI and PTSD.  Patient is a Cytogeneticist.  Hx includes HTN, DM, High cholesterol, GERD and Cancer.  Lactose intolerant.  Patient states that he has eaten well since admit.  Decreased appetite with decreased intake prior to admit secondary to depression and lack of money.  Patient receives food stamps.  Patient was not following a DM diet prior to admit.  Drinking sweetened beverages at times.    57 y.o. male  Height: Ht Readings from Last 1 Encounters:  11/03/12 5' 9.25" (1.759 m)    Weight: Wt Readings from Last 1 Encounters:  11/03/12 204 lb (92.534 kg)    Weight Hx: Wt Readings from Last 10 Encounters:  11/03/12 204 lb (92.534 kg)  03/23/12 211 lb (95.709 kg)    BMI:  Body mass index is 29.91 kg/(m^2). Pt meets criteria for overweight based on current BMI.  Estimated Nutritional Needs: Kcal: 25-30 kcal/kg Protein: > 1 gram protein/kg Fluid: 1 ml/kcal  Diet Order: Carb Control Pt is also offered choice of unit snacks mid-morning and mid-afternoon.  Pt is eating as desired.   Lab results and medications reviewed.   Oran Rein, RD, LDN Clinical Inpatient Dietitian Pager:  [phone] Weekend and after hours pager:  [phone]

## 2012-11-05 NOTE — Progress Notes (Signed)
D: Pt denies SI/HI/AV. Pt is pleasant and cooperative. Pt rates depression at a 6, anxiety at a 2, and Helplessness/hopelessness at a 6.  A: Pt was offered support and encouragement. Pt was given scheduled medications. Pt was encourage to attend groups. Q 15 minute checks were done for safety.  R:Pt attends groups and interacts well with peers and staff. Pt taking medication. Pt has no complaints.Pt receptive to treatment and safety maintained on unit. 

## 2012-11-05 NOTE — [facility] Counselor (Signed)
Adult Psychosocial Assessment Update Interdisciplinary Team  Previous [facility] admissions/discharges:  Admissions Discharges  Date: 03/23/12 Date: 03/27/12  Date: Date:  Date: Date:  Date: Date:  Date: Date:   Changes since the last Psychosocial Assessment (including adherence to outpatient mental health and/or substance abuse treatment, situational issues contributing to decompensation and/or relapse). Patient reports since he was last here he has left [employer] (Transitional Living  Placement he had for 2 years) and is now in his own apartment where he has been for   3 months. Patient reports he remains clean and sober since May of 2013 and is currently  Attending AA and has sponsor.  Patient reports he felt need to seek inpatient care due  To suicidal ideation.      Discharge Plan 1. Will you be returning to the same living situation after discharge?   Yes: X No:      If no, what is your plan?           2. Would you like a referral for services when you are discharged? Yes: X    If yes, for what services?  No:       Patient recieves services from [place] in [facility]       Summary and Recommendations (to be completed by the evaluator) Patient is 57 YO single [place] American male admitted with diagnosis of Bipolar,   Depressed following suicidal ideation.  Patient lives alone for last 3 months after 'graduating' from [facility] program.  He has remained clean and sober   Since May of 2013 yet has experienced worsening symptoms of depression of late to  [place] he was considering self harm.  Patient is seen at [facility] for medication  Management yet it is unclear if he receives therapy there. Patient will benefit from crisis  stabilization, medication evaluation, therapy groups for processing thoughts, feelings, and experiences, psycho ed groups for increasing coping skills, and aftercare planning.             Signature:  Clide Dales, 11/05/2012 4:30 PM

## 2012-11-05 NOTE — [facility] Group Notes (Signed)
[facility] Group Notes: (Clinical Social Work)   11/05/2012      Type of Therapy:  Group Therapy   Participation Level:  Did Not Attend    Ambrose Mantle, LCSW 11/05/2012, 4:45 PM

## 2012-11-06 LAB — GLUCOSE, CAPILLARY: Glucose-Capillary: 183 mg/dL — ABNORMAL HIGH (ref 70–99)

## 2012-11-06 NOTE — Progress Notes (Addendum)
D:  Patient's self inventory form, patient sleeps well, good appetite, normal energy level, good attention.  Rated depression #7, hopeless #6, anxiety #7.  Denied withdrawals.  Denied SI.  Has experienced pain in past 24 hours.  Pain goal #4 and worst pain #6.  After discharge, continue with after care and will manage medications.  Has right knee problems, hurt in military years ago. A:  Medications administered per MD orders.  Emotional support and encouragement given patient. R:  Denied SI and HI.  Denied A/V hallucinations.   Deneid pain.  Contracts for safety.  Will continue to monitor for safety with 15 minute checks.  Safety maintained.

## 2012-11-06 NOTE — Progress Notes (Signed)
Recreation Therapy Notes  Date: 09.22.2014 Time: 3:00pm Location: [address] Dayroom   Group Topic: Wellness  Goal Area(s) Addresses:  Patient will define components of whole wellness. Patient will verbalize benefit of whole wellness.  Behavioral Response: Attentive, Appropriate  Intervention: Informational Worksheet  Activity: 6 Dimensions of Health. Patients were asked to identify at least 3 ways they are personally addressing the 6 dimensions of health: Physical, Emotional, Spiritual, Social, Environmental and Intellectual.   Education: Discharge Planning, Coping Skills  Education Outcome: Acknowledges understanding  Clinical Observations/Feedback: Patient made no contributions to opening discussion, however he appeared to actively listen, as he maintained appropriate eye contact with speaker. Patient completed worksheet as instructed. Patient made contributions to wrap up discussion, but appeared to actively listen as he maintained appropriate eye contact with speaker, as well as nodded his head in agreement with points of interest.   [name], LRT/CTRS  [name] 11/06/2012 4:39 PM

## 2012-11-06 NOTE — [facility] Group Notes (Signed)
[facility] LCSW Group Therapy          Overcoming Obstacles       1:15 -2:30        11/06/2012   2:57 PM    Type of Therapy:  Group Therapy  Participation Level:  Appropriate  Participation Quality:  Appropriate  Affect:  Appropriate, Alert  Cognitive:  Attentive Appropriate  Insight: Developing/Improving Engaged  Engagement in Therapy: Developing/Improving Engaged  Modes of Intervention:  Discussion Exploration  Education Rapport BuildingProblem-Solving Support  Summary of Progress/Problems:  The main focus of today's group was overcoming  Obstacles.  He shared the obstacle he has to overcome stopping his medications.  Patient advised he will do well for a while but decides he does not need to be on medications.  He talked about how he will need to accept the need to continue medications and stay in contact with his outpatient provider.   Wynn Banker 11/06/2012    2:57 PM

## 2012-11-06 NOTE — [facility] Group Notes (Signed)
[facility] LCSW Aftercare Discharge Planning Group Note   11/06/2012 1:11 PM    Participation Quality:  Appropraite  Mood/Affect:  Appropriate  Depression Rating:  6  Anxiety Rating:  6  Thoughts of Suicide:  No  Will you contract for safety?   NA  Current AVH:  No  Plan for Discharge/Comments:  Patient attending discharge planning group and actively participated in group.  He advised of being followed by [facility] and having an appointment in early October.  CSW provided all participants with daily workbook.   Transportation Means: Patient has transportation.   Supports:  Patient has a support system.   Meesha Sek, Joesph July

## 2012-11-06 NOTE — Progress Notes (Signed)
Patient ID: [name], male   DOB: 06/13/1955, 57 y.o.   MRN: [id number] D: pt. Reports depression at "6" of 10.  Pt. This admission has been helpful "think more positive" "listening to everybody else story" Pt. Plans to [facility] at [facility]. A: Writer introduced self to client and encouraged group. Writer reviewed medication administration times. R: Pt. Is safe on the unit. Pt. Did not attend group.

## 2012-11-06 NOTE — Progress Notes (Signed)
Writer spoke with patient 1:1 and he reported that his day started out a little rough for him because he felt tired and sleepy most of the morning. Patient is hopeful that the new changes the doctor made with his meds will make a difference. Patient attended group this evening. Writer informed him of dosage changes medication changes and is med compliant. Support and encouragement offered, safety maintained on unit with 15 min checks, will continue to monitor.

## 2012-11-06 NOTE — Progress Notes (Signed)
Didn't attend group 

## 2012-11-06 NOTE — Progress Notes (Signed)
Adult Psychoeducational Group Note  Date:  11/06/2012 Time:  11:47 AM  Group Topic/Focus:  Developing a Wellness Toolbox:   The focus of this group is to help patients develop a "wellness toolbox" with skills and strategies to promote recovery upon discharge.  Participation Level:  Minimal  Participation Quality:  Attentive  Affect:  Appropriate  Cognitive:  Appropriate  Insight: Good  Engagement in Group:  Improving  Modes of Intervention:  Discussion  Additional Comments:    Edmonia Caprio 11/06/2012, 11:47 AM

## 2012-11-06 NOTE — Progress Notes (Signed)
Patient ID: [name], male   DOB: [birth year], 56 y.o.   MRN: [id number] [facility] MD Progress Note 11/06/2012 Montey Ebel  MRN:  [id number]  Subjective:  Patient resting in his room during group time, but states his depression is much better, he states he knows this as he is no longer thinking of ways to hurt himself. He denies SI/HI and voices no AVH. Reports his depression as a 2/10 which is down from an 8/10 on admission. His anxiety is a 6/10 and down from an 8/1- on admission. He feels that he has made good progress and is ready to return home.  Diagnosis:   DSM5: Schizophrenia Disorders:   Obsessive-Compulsive Disorders:   Trauma-Stressor Disorders:  Posttraumatic Stress Disorder (309.81) Substance/Addictive Disorders:   Depressive Disorders:  Major Depressive Disorder - Severe (296.23)  Axis I: Major Depression, Recurrent severe and Post Traumatic Stress Disorder  ADL's:  Impaired  Sleep: Fair  Appetite:  Fair  Suicidal Ideation:  Endorses suicidal ideation, intention and plans but contract for safety in the hospital Homicidal Ideation:  Denied AEB (as evidenced by):  Psychiatric Specialty Exam: ROS  Blood pressure 141/88, pulse 108, temperature 97.9 F (36.6 C), temperature source Oral, resp. rate 16, height 5' 9.25" (1.759 m), weight 92.534 kg (204 lb).Body mass index is 29.91 kg/(m^2).  General Appearance: Disheveled and Guarded  Eye Solicitor::  Fair  Speech:  Clear and Coherent  Volume:  Decreased  Mood:  Anxious, Depressed, Hopeless and Worthless  Affect:  Depressed and Flat  Thought Process:  Intact  Orientation:  Full (Time, Place, and Person)  Thought Content:  Rumination  Suicidal Thoughts:  Yes.  with intent/plan  Homicidal Thoughts:  No  Memory:  Immediate;   Fair  Judgement:  Impaired  Insight:  Lacking  Psychomotor Activity:  Psychomotor Retardation  Concentration:  Fair  Recall:  Fair  Akathisia:  NA  Handed:  Right  AIMS (if indicated):      Assets:  Communication Skills Desire for Improvement Housing Physical Health Resilience Social Support Transportation  Sleep:  Number of Hours: 6.75   Current Medications: Current Facility-Administered Medications  Medication Dose Route Frequency Provider Last Rate Last Dose  . acetaminophen (TYLENOL) tablet 650 mg  650 mg Oral Q6H PRN Court Joy, PA-C      . alum & mag hydroxide-simeth (MAALOX/MYLANTA) 200-200-20 MG/5ML suspension 30 mL  30 mL Oral Q4H PRN Court Joy, PA-C      . atorvastatin (LIPITOR) tablet 40 mg  40 mg Oral Daily Court Joy, PA-C   40 mg at 11/05/12 0809  . divalproex (DEPAKOTE) DR tablet 1,500 mg  1,500 mg Oral QHS Nehemiah Settle, MD      . glipiZIDE (GLUCOTROL) tablet 5 mg  5 mg Oral QAC lunch Court Joy, PA-C   5 mg at 11/05/12 1200  . lisinopril (PRINIVIL,ZESTRIL) tablet 20 mg  20 mg Oral Daily Court Joy, PA-C   20 mg at 11/05/12 0809  . magnesium hydroxide (MILK OF MAGNESIA) suspension 30 mL  30 mL Oral Daily PRN Court Joy, PA-C      . metFORMIN (GLUCOPHAGE) tablet 1,000 mg  1,000 mg Oral BID WC Court Joy, PA-C   1,000 mg at 11/05/12 0865  . metoprolol tartrate (LOPRESSOR) tablet 25 mg  25 mg Oral BID Court Joy, PA-C   25 mg at 11/05/12 0809  . mirtazapine (REMERON) tablet 30 mg  30 mg Oral QHS Nehemiah Settle,  MD      . QUEtiapine (SEROQUEL) tablet 100 mg  100 mg Oral QHS Court Joy, PA-C   100 mg at 11/04/12 2110    Lab Results:  Results for orders placed during the hospital encounter of 11/03/12 (from the past 48 hour(s))  GLUCOSE, CAPILLARY     Status: Abnormal   Collection Time    11/03/12  9:38 PM      Result Value Range   Glucose-Capillary 249 (*) 70 - 99 mg/dL  GLUCOSE, CAPILLARY     Status: Abnormal   Collection Time    11/04/12  6:19 AM      Result Value Range   Glucose-Capillary 158 (*) 70 - 99 mg/dL  GLUCOSE, CAPILLARY     Status: Abnormal   Collection Time    11/05/12  11:14 AM      Result Value Range   Glucose-Capillary 264 (*) 70 - 99 mg/dL    Physical Findings: AIMS: Facial and Oral Movements Muscles of Facial Expression: None, normal Lips and Perioral Area: None, normal Jaw: None, normal Tongue: None, normal,Extremity Movements Upper (arms, wrists, hands, fingers): None, normal Lower (legs, knees, ankles, toes): None, normal, Trunk Movements Neck, shoulders, hips: None, normal, Overall Severity Severity of abnormal movements (highest score from questions above): None, normal Incapacitation due to abnormal movements: None, normal Patient's awareness of abnormal movements (rate only patient's report): No Awareness, Dental Status Current problems with teeth and/or dentures?: Yes (back teeth) Does patient usually wear dentures?: No  CIWA:    COWS:     Treatment Plan Summary: Daily contact with patient to assess and evaluate symptoms and progress in treatment Medication management  Plan: 1. Continue crisis management and stabilization. 2. Medication management to reduce current symptoms to base line and improve patient's overall level of functioning 3. Treat health problems as indicated. 4. Develop treatment plan to decrease risk of relapse upon discharge and the need for readmission. 5. Psycho-social education regarding relapse prevention and self care. 6. Health care follow up as needed for medical problems. 7. Continue home medications where appropriate. 8. Patient will d/c out to home on Tuesday.   Treatment Plan/Recommendations:  Medical Decision Making Problem Points:  Established problem, worsening (2), Review of last therapy session (1) and Review of psycho-social stressors (1) Data Points:  Review or order clinical lab tests (1) Review or order medicine tests (1) Review of medication regiment & side effects (2) Review of new medications or change in dosage (2)  I certify that inpatient services furnished can reasonably be  expected to improve the patient's condition.  Rona Ravens. Kylil Swopes RPAC 4:29 PM 11/06/2012

## 2012-11-06 NOTE — [facility] Suicide Risk Assessment (Signed)
[facility] INPATIENT:  Family/Significant Other Suicide Prevention Education  Suicide Prevention Education:  Patient Refusal for Family/Significant Other Suicide Prevention Education: The patient [name] has refused to provide written consent for family/significant other to be provided Family/Significant Other Suicide Prevention Education during admission and/or prior to discharge.  Physician notified.  Wynn Banker 11/06/2012, 11:53 AM

## 2012-11-07 LAB — GLUCOSE, CAPILLARY
Glucose-Capillary: 163 mg/dL — ABNORMAL HIGH (ref 70–99)
Glucose-Capillary: 193 mg/dL — ABNORMAL HIGH (ref 70–99)

## 2012-11-07 MED ORDER — MIRTAZAPINE 30 MG PO TABS: 30.0000 mg | ORAL_TABLET | Freq: Every day | ORAL | Status: DC

## 2012-11-07 MED ORDER — METFORMIN HCL 1000 MG PO TABS: 1000.0000 mg | ORAL_TABLET | Freq: Two times a day (BID) | ORAL | Status: DC

## 2012-11-07 MED ORDER — METOPROLOL TARTRATE 50 MG PO TABS: 25.0000 mg | ORAL_TABLET | Freq: Two times a day (BID) | ORAL | Status: DC

## 2012-11-07 MED ORDER — QUETIAPINE FUMARATE 100 MG PO TABS: 100.0000 mg | ORAL_TABLET | Freq: Every day | ORAL | Status: DC

## 2012-11-07 MED ORDER — GLIPIZIDE 5 MG PO TABS: 5.0000 mg | ORAL_TABLET | Freq: Every day | ORAL | Status: DC

## 2012-11-07 MED ORDER — DIVALPROEX SODIUM 500 MG PO DR TAB: 1500.0000 mg | DELAYED_RELEASE_TABLET | Freq: Every day | ORAL | Status: DC

## 2012-11-07 MED ORDER — NICOTINE 14 MG/24HR TD PT24
14.0000 mg | MEDICATED_PATCH | Freq: Every day | TRANSDERMAL | Status: DC
  Administered 2012-11-07: 14 mg via TRANSDERMAL
  Filled 2012-11-07 (×4): qty 1

## 2012-11-07 MED ORDER — LISINOPRIL 20 MG PO TABS: 20.0000 mg | ORAL_TABLET | Freq: Every day | ORAL | Status: DC

## 2012-11-07 MED ORDER — ATORVASTATIN CALCIUM 80 MG PO TABS: 40.0000 mg | ORAL_TABLET | Freq: Every day | ORAL | Status: DC

## 2012-11-07 NOTE — Progress Notes (Addendum)
D:  Patient's self inventory sheet, patient sleeps well, good appetite, normal energy level, improving attention span.  Rated depression and hopelessness #3, anxiety #5.  Denied withdrawals.  Denied SI.  Still continues to have right knee pain at times.   Pain goal #4, worst pain #6.  After discharge, plans to take meds on time. Would like to thank staff for making stay better.  Does have discharge plans.  No problems taking meds after discharge. A"  Medications administered per MD orders.  Emotional support and encouragement given patient. R:  Denied SI and HI.  Denied A/V hallucinations.  Will continue to monitor patient for safety with 15 minute checks.   Safety maintained.
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
## 2012-11-07 NOTE — Progress Notes (Signed)
The focus of this group is to educate the patient on the purpose and policies of crisis stabilization and provide a format to answer questions about their admission.  The group details unit policies and expectations of patients while admitted.  Patient attended 0900 nurse education orientation group.  Patient listened attentively, appropriate affect, alert, appropriate insight and engagement.  Patient will work on goals for discharge.

## 2012-11-07 NOTE — Progress Notes (Signed)
[facility] Adult Case Management Discharge Plan :  Will you be returning to the same living situation after discharge: Yes,  returning home At discharge, do you have transportation home?:Yes,  access to transportation Do you have the ability to pay for your medications:Yes,  access to meds  Release of information consent forms completed and in the chart;  Patient's signature needed at discharge.  Patient to Follow up at: Follow-up Information   Follow up with [facility]. [facility] On 11/21/2012. (Tuesday, November 21, 2012 at 11:30 AM)    Contact information:   [address] [place], [place]  [address]  [phone]      Patient denies SI/HI:   Yes,  denies SI/HI    Safety Planning and Suicide Prevention discussed:  Yes,  discussed with pt.  Pt refused consent to contact family/friend.  See suicide prevention education note.  Carmina Miller 11/07/2012, 1:24 PM

## 2012-11-07 NOTE — [facility] Suicide Risk Assessment (Signed)
Suicide Risk Assessment  Discharge Assessment     Demographic Factors:  Male, Adolescent or young adult, Low socioeconomic status, Living alone and Unemployed  Mental Status Per Nursing Assessment::   On Admission:  NA  Current Mental Status by Physician: NA  Loss Factors: Financial problems/change in socioeconomic status  Historical Factors: Prior suicide attempts, Family history of suicide and Family history of mental illness or substance abuse  Risk Reduction Factors:   Sense of responsibility to family, Religious beliefs about death, Positive social support, Positive therapeutic relationship and Positive coping skills or problem solving skills  Continued Clinical Symptoms:  Severe Anxiety and/or Agitation Depression:   Recent sense of peace/wellbeing Unstable or Poor Therapeutic Relationship Previous Psychiatric Diagnoses and Treatments  Cognitive Features That Contribute To Risk:  Polarized thinking    Suicide Risk:  Minimal: No identifiable suicidal ideation.  Patients presenting with no risk factors but with morbid ruminations; may be classified as minimal risk based on the severity of the depressive symptoms  Discharge Diagnoses:   AXIS I:  Major Depression, Recurrent severe and Post Traumatic Stress Disorder AXIS II:  Deferred AXIS III:   Past Medical History  Diagnosis Date  . Arthritis   . Diabetes mellitus   . Hypertension   . Depression   . High cholesterol   . GERD (gastroesophageal reflux disease)   . Neuropathic pain   . Cancer    AXIS IV:  economic problems, occupational problems, other psychosocial or environmental problems, problems related to social environment and problems with primary support group AXIS V:  61-70 mild symptoms  Plan Of Care/Follow-up recommendations:  Activity:  as tolerated Diet:  Regular  Is patient on multiple antipsychotic therapies at discharge:  No   Has Patient had three or more failed trials of antipsychotic  monotherapy by history:  No  Recommended Plan for Multiple Antipsychotic Therapies: NA  Nevaeh Korte,[name] 11/07/2012, 10:22 AM

## 2012-11-07 NOTE — Progress Notes (Signed)
Adult Psychoeducational Group Note  Date:  11/07/2012 Time:  11:00am Group Topic/Focus:  Recovery Goals:   The focus of this group is to identify appropriate goals for recovery and establish a plan to achieve them.  Participation Level:  Active  Participation Quality:  Appropriate and Attentive  Affect:  Appropriate  Cognitive:  Alert and Appropriate  Insight: Appropriate  Engagement in Group:  Engaged  Modes of Intervention:  Discussion and Education  Additional Comments:  Pt attended and participated in group. When ask what did recovery mean to him pt stated positive affirmation, and being healthy and happy. When ask what he could do to help himself with his recovery process pt stated putting things into action by talking to someone when he needs to and taking meds.  Shelly Bombard D 11/07/2012, 1:15 PM

## 2012-11-07 NOTE — Progress Notes (Signed)
Discharge Note:  Patient received all his belongings, clothing, shoes, belt, wallet, money, prescriptions, medications, all belongings.  Denied SI and HI.  Denied A/V hallucinations.  Denied pain.  Patient was given suicide prevention information which was discussed with patient who stated he understood and had no questions.   Patient stated he appreciated all assistance received from [facility] staff.

## 2012-11-08 LAB — GLUCOSE, CAPILLARY: Glucose-Capillary: 283 mg/dL — ABNORMAL HIGH (ref 70–99)

## 2012-11-08 NOTE — Progress Notes (Signed)
Agree with assessment and plan Mathea Frieling A. Parke Jandreau, M.D. 

## 2012-11-10 NOTE — Progress Notes (Signed)
Patient Discharge Instructions:  After Visit Summary (AVS):   Faxed to:  11/10/12 Discharge Summary Note:   Faxed to:  11/10/12 Psychiatric Admission Assessment Note:   Faxed to:  11/10/12 Suicide Risk Assessment - Discharge Assessment:   Faxed to:  11/10/12 Faxed/Sent to the Next Level Care provider:  11/10/12 Fax[facility] to [facility] @ [phone]  Jerelene Redden, 11/10/2012, 2:06 PM

## 2013-03-30 ENCOUNTER — Emergency Department (EMERGENCY_DEPARTMENT_HOSPITAL)
Admission: EM | Admit: 2013-03-30 | Discharge: 2013-04-02 | Disposition: A | Discharge status: Home / Self Care | Payer: Non-veteran care | Source: Home / Self Care | Attending: Emergency Medicine | Admitting: Emergency Medicine

## 2013-03-30 ENCOUNTER — Encounter (HOSPITAL_COMMUNITY): Payer: Self-pay | Admitting: Emergency Medicine

## 2013-03-30 DIAGNOSIS — E119 Type 2 diabetes mellitus without complications: Secondary | ICD-10-CM

## 2013-03-30 DIAGNOSIS — I10 Essential (primary) hypertension: Secondary | ICD-10-CM | POA: Diagnosis present

## 2013-03-30 DIAGNOSIS — E7800 Pure hypercholesterolemia, unspecified: Secondary | ICD-10-CM

## 2013-03-30 DIAGNOSIS — F17200 Nicotine dependence, unspecified, uncomplicated: Secondary | ICD-10-CM

## 2013-03-30 DIAGNOSIS — F333 Major depressive disorder, recurrent, severe with psychotic symptoms: Principal | ICD-10-CM | POA: Diagnosis present

## 2013-03-30 DIAGNOSIS — Z859 Personal history of malignant neoplasm, unspecified: Secondary | ICD-10-CM

## 2013-03-30 DIAGNOSIS — F329 Major depressive disorder, single episode, unspecified: Secondary | ICD-10-CM | POA: Insufficient documentation

## 2013-03-30 DIAGNOSIS — Z8719 Personal history of other diseases of the digestive system: Secondary | ICD-10-CM | POA: Insufficient documentation

## 2013-03-30 DIAGNOSIS — Z79899 Other long term (current) drug therapy: Secondary | ICD-10-CM

## 2013-03-30 DIAGNOSIS — Z8739 Personal history of other diseases of the musculoskeletal system and connective tissue: Secondary | ICD-10-CM

## 2013-03-30 DIAGNOSIS — R45851 Suicidal ideations: Secondary | ICD-10-CM | POA: Insufficient documentation

## 2013-03-30 DIAGNOSIS — F4310 Post-traumatic stress disorder, unspecified: Secondary | ICD-10-CM | POA: Diagnosis present

## 2013-03-30 DIAGNOSIS — K219 Gastro-esophageal reflux disease without esophagitis: Secondary | ICD-10-CM | POA: Diagnosis present

## 2013-03-30 DIAGNOSIS — F3289 Other specified depressive episodes: Secondary | ICD-10-CM | POA: Insufficient documentation

## 2013-03-30 LAB — RAPID URINE DRUG SCREEN, HOSP PERFORMED
AMPHETAMINES: NOT DETECTED
Barbiturates: NOT DETECTED
Benzodiazepines: NOT DETECTED
COCAINE: NOT DETECTED
OPIATES: NOT DETECTED
TETRAHYDROCANNABINOL: NOT DETECTED

## 2013-03-30 LAB — COMPREHENSIVE METABOLIC PANEL
ALBUMIN: 4.1 g/dL (ref 3.5–5.2)
ALT: 14 U/L (ref 0–53)
AST: 17 U/L (ref 0–37)
Alkaline Phosphatase: 79 U/L (ref 39–117)
BUN: 11 mg/dL (ref 6–23)
CALCIUM: 10.5 mg/dL (ref 8.4–10.5)
CO2: 23 mEq/L (ref 19–32)
CREATININE: 0.92 mg/dL (ref 0.50–1.35)
Chloride: 106 mEq/L (ref 96–112)
GFR calc Af Amer: >90 mL/min (ref >90)
GFR calc non Af Amer: >90 mL/min (ref >90)
Glucose, Bld: 171 mg/dL — ABNORMAL HIGH (ref 70–99)
Potassium: 4.6 mEq/L (ref 3.7–5.3)
SODIUM: 142 meq/L (ref 137–147)
Total Bilirubin: 0.3 mg/dL (ref 0.3–1.2)
Total Protein: 7.8 g/dL (ref 6.0–8.3)

## 2013-03-30 LAB — CBC
HCT: 44.9 % (ref 39.0–52.0)
Hemoglobin: 16 g/dL (ref 13.0–17.0)
MCH: 29.3 pg (ref 26.0–34.0)
MCHC: 35.6 g/dL (ref 30.0–36.0)
MCV: 82.1 fL (ref 78.0–100.0)
PLATELETS: 206 10*3/uL (ref 150–400)
RBC: 5.47 MIL/uL (ref 4.22–5.81)
RDW: 13.2 % (ref 11.5–15.5)
WBC: 4.9 10*3/uL (ref 4.0–10.5)

## 2013-03-30 LAB — ETHANOL: Alcohol, Ethyl (B): <11 mg/dL (ref 0–11)

## 2013-03-30 LAB — SALICYLATE LEVEL: Salicylate Lvl: <2 mg/dL — ABNORMAL LOW (ref 2.8–20.0)

## 2013-03-30 LAB — ACETAMINOPHEN LEVEL: Acetaminophen (Tylenol), Serum: <15 ug/mL (ref 10–30)

## 2013-03-30 MED ORDER — ALUM & MAG HYDROXIDE-SIMETH 200-200-20 MG/5ML PO SUSP: 30.0000 mL | ORAL | Status: DC | PRN

## 2013-03-30 MED ORDER — ZOLPIDEM TARTRATE 5 MG PO TABS
5.0000 mg | ORAL_TABLET | Freq: Every evening | ORAL | Status: DC | PRN
  Administered 2013-04-01: 5 mg via ORAL
  Filled 2013-03-30: qty 1

## 2013-03-30 MED ORDER — IBUPROFEN 200 MG PO TABS: 600.0000 mg | ORAL_TABLET | Freq: Three times a day (TID) | ORAL | Status: DC | PRN

## 2013-03-30 MED ORDER — ONDANSETRON HCL 4 MG PO TABS: 4.0000 mg | ORAL_TABLET | Freq: Three times a day (TID) | ORAL | Status: DC | PRN

## 2013-03-30 MED ORDER — NICOTINE 21 MG/24HR TD PT24
21.0000 mg | MEDICATED_PATCH | Freq: Every day | TRANSDERMAL | Status: DC
  Administered 2013-03-30 – 2013-04-02 (×4): 21 mg via TRANSDERMAL
  Filled 2013-03-30 (×3): qty 1
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
## 2013-03-30 NOTE — BH Assessment (Signed)
Consulted with Serena Colonel, NP who agrees pt meets criteria for inpatient treatment. Notified Dr. Leonides Schanz of recommendations.
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
## 2013-03-30 NOTE — ED Notes (Signed)
Pt in stating he has been feeling more depressed and anxious lately, states he has been compliant with his medications but he doesn't like the kind he is on because it upsets his stomach, states he has been sleeping more than usually and waking up later in the day and just not feeling like himself and he knows that means his medications isn't working like it should be. Denies substance use.

## 2013-03-30 NOTE — ED Notes (Signed)
TTS taking place.

## 2013-03-30 NOTE — BH Assessment (Signed)
Received a call for tele-assessment. Spoke with Dr. Leonides Schanz who stated that patient has a history of suicide attempts. Pt has been fighting with his mother. Reported feeling helpless and hopeless. Pt has a plan to drink insecticide. No HI. Tele-assessment will be initiated.

## 2013-03-30 NOTE — ED Notes (Signed)
Dinner tray ordered.

## 2013-03-31 LAB — GLUCOSE, CAPILLARY: Glucose-Capillary: 223 mg/dL — ABNORMAL HIGH (ref 70–99)

## 2013-03-31 MED ORDER — TRAMADOL HCL 50 MG PO TABS
50.0000 mg | ORAL_TABLET | Freq: Four times a day (QID) | ORAL | Status: DC | PRN
  Administered 2013-04-02: 50 mg via ORAL
  Filled 2013-03-31: qty 1

## 2013-03-31 MED ORDER — GLIPIZIDE 5 MG PO TABS
5.0000 mg | ORAL_TABLET | Freq: Every day | ORAL | Status: DC
  Administered 2013-03-31 – 2013-04-02 (×3): 5 mg via ORAL
  Filled 2013-03-31 (×4): qty 1

## 2013-03-31 MED ORDER — ATORVASTATIN CALCIUM 40 MG PO TABS
40.0000 mg | ORAL_TABLET | Freq: Every day | ORAL | Status: DC
  Administered 2013-03-31 – 2013-04-02 (×3): 40 mg via ORAL
  Filled 2013-03-31 (×4): qty 1

## 2013-03-31 MED ORDER — QUETIAPINE FUMARATE 25 MG PO TABS
100.0000 mg | ORAL_TABLET | Freq: Every day | ORAL | Status: DC
  Administered 2013-03-31 – 2013-04-01 (×3): 100 mg via ORAL
  Filled 2013-03-31 (×3): qty 4

## 2013-03-31 MED ORDER — MIRTAZAPINE 30 MG PO TABS
30.0000 mg | ORAL_TABLET | Freq: Every day | ORAL | Status: DC
  Administered 2013-03-31 – 2013-04-01 (×3): 30 mg via ORAL
  Filled 2013-03-31 (×4): qty 1

## 2013-03-31 MED ORDER — LISINOPRIL 20 MG PO TABS
20.0000 mg | ORAL_TABLET | Freq: Every day | ORAL | Status: DC
  Administered 2013-03-31 – 2013-04-02 (×3): 20 mg via ORAL
  Filled 2013-03-31 (×4): qty 1

## 2013-03-31 MED ORDER — METFORMIN HCL 500 MG PO TABS
1000.0000 mg | ORAL_TABLET | Freq: Two times a day (BID) | ORAL | Status: DC
  Administered 2013-03-31 – 2013-04-02 (×5): 1000 mg via ORAL
  Filled 2013-03-31 (×5): qty 2

## 2013-03-31 MED ORDER — DIVALPROEX SODIUM 250 MG PO DR TAB
500.0000 mg | DELAYED_RELEASE_TABLET | Freq: Two times a day (BID) | ORAL | Status: DC
  Administered 2013-03-31 – 2013-04-02 (×6): 500 mg via ORAL
  Filled 2013-03-31 (×6): qty 2

## 2013-03-31 MED ORDER — METOPROLOL TARTRATE 25 MG PO TABS
25.0000 mg | ORAL_TABLET | Freq: Two times a day (BID) | ORAL | Status: DC
  Administered 2013-03-31 – 2013-04-02 (×6): 25 mg via ORAL
  Filled 2013-03-31 (×6): qty 1

## 2013-03-31 NOTE — BH Assessment (Signed)
[place] Assessment Progress Note   Pt seen for reassessment this day via tele assessment @ 0932 and this was scheduled with pt's nurse, Santiago Glad.  Pt continues to report SI with plan to stab self with a kitchen knife or drink pesticide.  Pt denies HI.  Pt stated when he becomes depressed, he thinks that he sees things that aren't there when he is watching TV in the dark at night.  Pt denies SA.  Pt is oriented x 4, is calm, cooperative, has depressed mood.  Pt requesting to go to the [place].  Pt is also pending other facilities, as there are no beds currently at [facility].  He is pending at [place], [place], [place], [place] and [place].  TTS will continue to seek placement for the pt.  Shaune Pascal, MS, [facility] Licensed Professional Counselor Triage Specialist

## 2013-03-31 NOTE — ED Notes (Signed)
[facility] aware that the pt would like to have his home medications changed. His case will be reviewed by the provider at [facility].

## 2013-03-31 NOTE — ED Notes (Signed)
Pt was sleeping and watching TV upon arrival to room, he is pleasant tand cooperative. States he is still suicidal

## 2013-03-31 NOTE — Progress Notes (Signed)
[facility] [place] application has been received.  Both [place] and [place] applications have been faxed to [facility] for pt and MD to complete.  Once completed will fax applications as well as referral.   Rick Duff Disposition MHT

## 2013-03-31 NOTE — ED Notes (Signed)
Pharmacy sent second request for missing med.

## 2013-03-31 NOTE — BH Assessment (Signed)
Per Clayborne Dana, [facility] at [facility], adult unit is at capacity. Contacted the following facilities for placement:   [facility] requesting bed availability [place]- Bed available. Faxed clinical information.  [facility]- Bed available. Faxed clinical information.  [facility]- Bed available. Faxed clinical information.  [place] available. Faxed clinical information.  [place]- Bed available. Faxed clinical information.   [facility]- At capacity  [facility]- Left voicemail. No response.  Gary Jr, [facility], [facility]  Triage Specialist

## 2013-03-31 NOTE — Progress Notes (Signed)
D/t pt being a veteran the following [place] have been contacted regarding bed status:  [facility]- per [employer]- per [place] currently on diversion [place]- per [facility] no beds available [place]- per [employer] available, stated she will fax packet to send for review, awaiting packet   Rick Duff Disposition MHT

## 2013-03-31 NOTE — Progress Notes (Signed)
Patient ID: [name], male   DOB: November 15, 1955, 58 y.o.   MRN: [id number] S- Received call from PM [facility] who had been informed by [facility] pt requesting some type of adjustment in his psych meds because they upset his stomach.  O-Nurse note reports pt wants psych meds changed     Pt has type II Diabetes. Pt has been on pantaprozole in past but not listed on his Home meds  A-Any adjustment of psych meds neds to be done by treating Physician in Psychiatric facility when pt is found a bed     The stomach upset may not be due to his psych meds at all P-AC notified of above.

## 2013-03-31 NOTE — ED Notes (Signed)
cbg [id number] 

## 2013-03-31 NOTE — ED Notes (Signed)
This RN spoke with the [facility] at [facility], and was told that the provider at [facility] was not comfortable adjusting the patient psych meds, stating that he believes that the problem is related to his GERD not the medications, and he needs to follow up with his PCP.

## 2013-03-31 NOTE — ED Notes (Signed)
Pharmacy notified that the seroquel has still not been sent up for the pt.

## 2013-04-01 LAB — GLUCOSE, CAPILLARY
Glucose-Capillary: 210 mg/dL — ABNORMAL HIGH (ref 70–99)
Glucose-Capillary: 128 mg/dL — ABNORMAL HIGH (ref 70–99)

## 2013-04-01 NOTE — ED Provider Notes (Signed)
Pt has been requesting transfer to [place] in [place].  Pt is medically cleared and can be transferred when appropriate.  Saddie Benders. Dorna Mai, MD 04/01/13 1638

## 2013-04-01 NOTE — BH Assessment (Signed)
Per Joann Glover, AC at [facility], adult unit is at capacity. Contacted the following facilities for placement:  [facility]: At capacity [facility]: At capacity [facility]: At capacity [facility]: At capacity [facility]: At capacity [facility]: At capacity [facility]: At capacity [facility]: At capacity [facility]: At capacity [facility]: At capacity [facility]: At capacity [place] [facility]: At capacity [place]: At capacity [place]: At capacity  Ford Ellis Warrick Jr, LPC, [facility] Triage Specialist 

## 2013-04-01 NOTE — ED Notes (Signed)
Jeani Hawking, the administrator at the [facility] hospital, called asking about the pt's transfer status. This RN told her that she was unsure, as she had not heard anything about his transfer status. This RN told Jeani Hawking that she would call [facility] to find out their plan, and she would call back. Lynn's phone number is [phone].

## 2013-04-01 NOTE — ED Notes (Signed)
This RN again spoke with [name] to update her on the pt's transfer status. [name] told this RN that referrals were sent out to all of the area treatment facilities, but all are at capacity, so they are waiting for a bed to open up for the patient.

## 2013-04-01 NOTE — Progress Notes (Signed)
Received [facility] Paper work for both [employer] and [place] from ED.  Pt's referral along with application has been faxed to both [facility].  Will call to follow-up.   Rick Duff Disposition MHT

## 2013-04-01 NOTE — BH Assessment (Signed)
[place] Assessment Progress Note   Pt seen for reassessment this day via tele assessment @ 0900. Pt continues to report SI with plan to stab self with a kitchen knife or drink pesticide. Pt denies HI. Pt stated, "I still feel the same." Pt denies SA. Pt is oriented x 4, is calm, cooperative, has depressed mood. Pt requesting to go to the [place]. Informed pt that his referral has been sent to the [place].  Pt is also pending other facilities, and there are no beds currently at [facility]. Consulted with EDP Ghim @ 0945 and informed him pt is pending several inpatient psychiatric facilities and that TTS will continue to look for placement for the pt.     Shaune Pascal, MS, [facility]  Licensed Professional Counselor  Triage Specialist

## 2013-04-02 ENCOUNTER — Inpatient Hospital Stay (HOSPITAL_COMMUNITY)
Admission: AD | Admit: 2013-04-02 | Discharge: 2013-04-10 | DRG: 885 | Disposition: A | Discharge status: Home / Self Care | Payer: Non-veteran care | Source: Intra-hospital | Attending: Psychiatry | Admitting: Psychiatry

## 2013-04-02 ENCOUNTER — Encounter (HOSPITAL_COMMUNITY): Payer: Self-pay | Admitting: Family

## 2013-04-02 ENCOUNTER — Encounter (HOSPITAL_COMMUNITY): Payer: Self-pay | Admitting: *Deleted

## 2013-04-02 DIAGNOSIS — F39 Unspecified mood [affective] disorder: Secondary | ICD-10-CM | POA: Diagnosis present

## 2013-04-02 DIAGNOSIS — F319 Bipolar disorder, unspecified: Secondary | ICD-10-CM | POA: Diagnosis present

## 2013-04-02 DIAGNOSIS — F332 Major depressive disorder, recurrent severe without psychotic features: Secondary | ICD-10-CM

## 2013-04-02 DIAGNOSIS — R45851 Suicidal ideations: Secondary | ICD-10-CM

## 2013-04-02 DIAGNOSIS — F4310 Post-traumatic stress disorder, unspecified: Secondary | ICD-10-CM

## 2013-04-02 DIAGNOSIS — IMO0001 Reserved for inherently not codable concepts without codable children: Secondary | ICD-10-CM

## 2013-04-02 DIAGNOSIS — E1165 Type 2 diabetes mellitus with hyperglycemia: Secondary | ICD-10-CM

## 2013-04-02 DIAGNOSIS — F411 Generalized anxiety disorder: Secondary | ICD-10-CM

## 2013-04-02 DIAGNOSIS — I10 Essential (primary) hypertension: Secondary | ICD-10-CM

## 2013-04-02 LAB — GLUCOSE, CAPILLARY: Glucose-Capillary: 206 mg/dL — ABNORMAL HIGH (ref 70–99)

## 2013-04-02 NOTE — ED Notes (Signed)
[facility] order placed 

## 2013-04-02 NOTE — Tx Team (Addendum)
Initial Interdisciplinary Treatment Plan  PATIENT STRENGTHS: (choose at least two) Average or above average intelligence Capable of independent living Communication skills Financial means General fund of knowledge Supportive family/friends  PATIENT STRESSORS: Health problems   PROBLEM LIST: Problem List/Patient Goals Date to be addressed Date deferred Reason deferred Estimated date of resolution  Suicidal Ideation 04-02-13     Depression 04-02-13     Right knee osteoarthritis 04-02-13     Hypertension 04-02-13     diabetes 04-02-13     GERDS 04-02-13                        DISCHARGE CRITERIA:  Improved stabilization in mood, thinking, and/or behavior Need for constant or close observation no longer present Verbal commitment to aftercare and medication compliance  PRELIMINARY DISCHARGE PLAN: Attend aftercare/continuing care group Outpatient therapy  PATIENT/FAMIILY INVOLVEMENT: This treatment plan has been presented to and reviewed with the patient, [name], and/or family member.  The patient and family have been given the opportunity to ask questions and make suggestions.  [name] 04/02/2013, 9:55 PM

## 2013-04-02 NOTE — Progress Notes (Signed)
B.Lorea Kupfer, MHT spoke with Alfonzo Beers, [facility] at [facility] who reports that patient has been assigned to Bed 508-1 accepted by [place]. Writer will complete support paper work with patient and fax to [facility], informed attending nurse of disposition who will arrange for transfer via [place].
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
## 2013-04-02 NOTE — ED Notes (Signed)
IVC served .

## 2013-04-02 NOTE — ED Notes (Signed)
TTS computer placed in PT room and call initiated

## 2013-04-02 NOTE — ED Notes (Signed)
Waiting for pharmacy to send glipiZIDE

## 2013-04-02 NOTE — Progress Notes (Addendum)
Per, Remo Lipps at the [facility] the referral has been received; however, there are not beds.    Per, Max at [facility] the referral has been receive; however, it will not be reviewed until tomorrow due to the holiday (Presients Day).  Max reports that the coordinators will be at work tomorrow morning in order to review the referral.

## 2013-04-02 NOTE — ED Provider Notes (Signed)
IVC now complete Awaiting placement   Sharyon Cable, MD 04/02/13 1630

## 2013-04-02 NOTE — ED Notes (Signed)
Transported to [place] with 2 [employer] officers with IVC papers and documents , personal belongings / valuables from security office given to pt.'s sitter and police at time of transport. Pt. ambulatory in stable condition .

## 2013-04-02 NOTE — Progress Notes (Signed)
B.Roczen Waymire, MHT in to speak with patient about admission to [facility]. Patient stated he was anticipating going to a [place] facility. Writer informed patient that a follow up with referral would be conducted and if a [place] facility has availability on today and will accept on today that placement could be facilitated if not patient has placement at [facility] waiting. Writer followed up with referral to [facility] [place] [facility] spoke with AOD Annie Main who reports no bed availability at this time. Writer contacted [facility] [place] spoke Jenny Reichmann who reports that they are on psych diversion. Writer informed patient that both facilities do not have bed availability at this time. Patient states that he does not want to go to [facility] prefers to go to [place] and wants to just go home. Writer explained to patient that he would need to be reevaluated to determine if appropriate for discharge. Writer spoke with Alfonzo Beers, [facility] for [facility] who will initiate tele psych request by attending extender. Writer informed attending nurse.

## 2013-04-02 NOTE — Consult Note (Signed)
Telepsych Consultation   Reason for Consult:  MDD with Suicidal Ideation Referring Physician:  EDP [name] is an 58 y.o. male.  Assessment: AXIS I:  Generalized Anxiety Disorder, Major Depression, Recurrent severe and Post Traumatic Stress Disorder AXIS II:  Deferred AXIS III:   Past Medical History  Diagnosis Date  . Arthritis   . Diabetes mellitus   . Hypertension   . Depression   . High cholesterol   . GERD (gastroesophageal reflux disease)   . Neuropathic pain   . Cancer    AXIS IV:  other psychosocial or environmental problems and problems related to social environment AXIS V:  41-50 serious symptoms  Plan:  Recommend psychiatric Inpatient admission when medically cleared.   *PT MUST BE IVC'D IF HE REFUSES TO COME TO [facility]*  Subjective:   [name] is a 58 y.o. male patient presenting to the [facility] with suicidal ideation and a plan to stab himself or ingest poison. During this telepsych assessment, pt affirms SI without plan at this time, denies HI and AVH, and contracts for safety. Pt rates anxiety at 7/10 and depression at 8/10. Pt states that his major triggers are family and financial stresses. When informing pt that the [facility] was unable to take him at this time due to it being a national holiday, pt became very irate and raised his voice, stating "you might have to take me to jail then if you won't send me home". Pt was informed that we would check with the ED about him staying 24 hrs (which was declined), and that if so, he could stay and be reevaluated. [name] was already in the [facility] for an inappropriate amount of time and had to be discharged or admitted. When informed of this fact, pt became more irate and stated "I'm not suicidal anymore, I'm fine, I take that back". Pt was informed that he could not rescind his statement accurately due to appearing to be inconsistent, another sign of instability concomitantly with his suicidality.   HPI:   [name]  is an 58 y.o. male presenting to [facility] with thoughts to harm himself. Pt reported everything is getting worst. Pt shared that he has always had issues with his mother and brother; however over the past month and a half he has been having more conflict with them. Pt reported that he recently began having thought to harm himself. Pt reported that several weeks ago he thought about taking a knife from the kitchen to "end it all". Pt also reported that tonight that he thought about ending his life with insecticide. Pt reported that for the past several weeks he has been feeling useless and doesn't want to live anymore. Pt also reported that he has been experiencing mood swings and having bad dream more frequently. Pt also stated that he does not sleep well at night because he is worrying about what his mother and brother thinks of him. Pt also reported a decrease in his energy level and stated that he tries to keep to himself. Pt reported that he has not been "feeling like myself" lately. He has also been experiencing crying spells throughout the day and often feels like "I am not any good" and "feeling less than".   HPI Elements:   Location:  Generalized, MCED. Quality:  Worsening. Severity:  Severe. Timing:  Constant. Duration:  Chronic.  Past Psychiatric History: Past Medical History  Diagnosis Date  . Arthritis   . Diabetes mellitus   . Hypertension   .  Depression   . High cholesterol   . GERD (gastroesophageal reflux disease)   . Neuropathic pain   . Cancer     reports that he has been smoking Cigarettes.  He has a 10 pack-year smoking history. He does not have any smokeless tobacco history on file. He reports that he does not drink alcohol or use illicit drugs. History reviewed. No pertinent family history. Family History Substance Abuse: Yes, Describe: (Younger sister addicted to crack/cocaine.) Family Supports: Yes, List: (Parents ) Living Arrangements: Alone Can pt return to current  living arrangement?: Yes Allergies:   Allergies  Allergen Reactions  . Morphine And Related     Pt unable to take narcotics.     ACT Assessment Complete:  No:   Past Psychiatric History: Diagnosis:  MDD, GAD, Suicidal Ideation, PTSD  Hospitalizations:  [facility] x2  Outpatient Care:  Under [facility] care  [facility]stance Abuse Care:  [facility]  Self-Mutilation:  Denies  Suicidal Attempts:  Denies  Homicidal Behaviors:  Denies   Violent Behaviors:  Denies   Place of Residence:  [facility]) Marital Status:  Single Employed/Unemployed:   Education:   Family Supports:  Objective: Blood pressure 135/86, pulse 98, temperature 98 F (36.7 C), temperature source Oral, resp. rate 18, SpO2 96.00%.There is no weight on file to calculate BMI. Results for orders placed during the hospital encounter of 03/30/13 (from the past 72 hour(s))  URINE RAPID DRUG SCREEN (HOSP PERFORMED)     Status: None   Collection Time    03/30/13  4:52 PM      Result Value Ref Range   Opiates NONE DETECTED  NONE DETECTED   Cocaine NONE DETECTED  NONE DETECTED   Benzodiazepines NONE DETECTED  NONE DETECTED   Amphetamines NONE DETECTED  NONE DETECTED   Tetrahydrocannabinol NONE DETECTED  NONE DETECTED   Barbiturates NONE DETECTED  NONE DETECTED   Comment:            DRUG SCREEN FOR MEDICAL PURPOSES     ONLY.  IF CONFIRMATION IS NEEDED     FOR ANY PURPOSE, NOTIFY LAB     WITHIN 5 DAYS.                LOWEST DETECTABLE LIMITS     FOR URINE DRUG SCREEN     Drug Class       Cutoff (ng/mL)     Amphetamine      1000     Barbiturate      200     Benzodiazepine   [id number]     Tricyclics       [id number]     Opiates          300     Cocaine          300     THC              50  SALICYLATE LEVEL     Status: Abnormal   Collection Time    03/30/13  5:13 PM      Result Value Ref Range   Salicylate Lvl 123456 (*) 2.8 - 20.0 mg/dL  ACETAMINOPHEN LEVEL     Status: None   Collection Time    03/30/13  5:13 PM       Result Value Ref Range   Acetaminophen (Tylenol), Serum <15.0  10 - 30 ug/mL   Comment:            THERAPEUTIC CONCENTRATIONS VARY  SIGNIFICANTLY. A RANGE OF 10-30     ug/mL MAY BE AN EFFECTIVE     CONCENTRATION FOR MANY PATIENTS.     HOWEVER, SOME ARE BEST TREATED     AT CONCENTRATIONS OUTSIDE THIS     RANGE.     ACETAMINOPHEN CONCENTRATIONS     >150 ug/mL AT 4 HOURS AFTER     INGESTION AND >50 ug/mL AT 12     HOURS AFTER INGESTION ARE     OFTEN ASSOCIATED WITH TOXIC     REACTIONS.  GLUCOSE, CAPILLARY     Status: Abnormal   Collection Time    03/31/13 10:08 AM      Result Value Ref Range   Glucose-Capillary 223 (*) 70 - 99 mg/dL  GLUCOSE, CAPILLARY     Status: Abnormal   Collection Time    03/31/13  6:36 PM      Result Value Ref Range   Glucose-Capillary 210 (*) 70 - 99 mg/dL  GLUCOSE, CAPILLARY     Status: Abnormal   Collection Time    04/01/13  7:01 AM      Result Value Ref Range   Glucose-Capillary 128 (*) 70 - 99 mg/dL   Labs resulted, reviewed, and stable at this time.   Current Facility-Administered Medications  Medication Dose Route Frequency Provider Last Rate Last Dose  . alum & mag hydroxide-simeth (MAALOX/MYLANTA) 200-200-20 MG/5ML suspension 30 mL  30 mL Oral PRN Kristen N Ward, DO      . atorvastatin (LIPITOR) tablet 40 mg  40 mg Oral Daily Kristen N Ward, DO   40 mg at 04/02/13 1035  . divalproex (DEPAKOTE) DR tablet 500 mg  500 mg Oral BID Kristen N Ward, DO   500 mg at 04/02/13 1034  . glipiZIDE (GLUCOTROL) tablet 5 mg  5 mg Oral QAC lunch Kristen N Ward, DO   5 mg at 04/01/13 1239  . ibuprofen (ADVIL,MOTRIN) tablet 600 mg  600 mg Oral Q8H PRN Kristen N Ward, DO      . lisinopril (PRINIVIL,ZESTRIL) tablet 20 mg  20 mg Oral Daily Kristen N Ward, DO   20 mg at 04/02/13 1254  . metFORMIN (GLUCOPHAGE) tablet 1,000 mg  1,000 mg Oral BID WC Kristen N Ward, DO   1,000 mg at 04/02/13 0826  . metoprolol tartrate (LOPRESSOR) tablet 25 mg  25 mg Oral BID  Kristen N Ward, DO   25 mg at 04/02/13 1034  . mirtazapine (REMERON) tablet 30 mg  30 mg Oral QHS Kristen N Ward, DO   30 mg at 04/01/13 2142  . nicotine (NICODERM CQ - dosed in mg/24 hours) patch 21 mg  21 mg Transdermal Daily Kristen N Ward, DO   21 mg at 04/02/13 1119  . ondansetron (ZOFRAN) tablet 4 mg  4 mg Oral Q8H PRN Kristen N Ward, DO      . QUEtiapine (SEROQUEL) tablet 100 mg  100 mg Oral QHS Kristen N Ward, DO   100 mg at 04/01/13 2142  . traMADol (ULTRAM) tablet 50 mg  50 mg Oral Q6H PRN Kristen N Ward, DO      . zolpidem (AMBIEN) tablet 5 mg  5 mg Oral QHS PRN Delice Bison Ward, DO   5 mg at 04/01/13 2141   Current Outpatient Prescriptions  Medication Sig Dispense Refill  . atorvastatin (LIPITOR) 80 MG tablet Take 0.5 tablets (40 mg total) by mouth daily.      . divalproex (DEPAKOTE) 500 MG DR tablet Take 500 mg  by mouth 2 (two) times daily.      Marland Kitchen glipiZIDE (GLUCOTROL) 5 MG tablet Take 1 tablet (5 mg total) by mouth daily before lunch.      . lisinopril (PRINIVIL,ZESTRIL) 20 MG tablet Take 1 tablet (20 mg total) by mouth daily.      . metFORMIN (GLUCOPHAGE) 1000 MG tablet Take 1 tablet (1,000 mg total) by mouth 2 (two) times daily with a meal.      . metoprolol (LOPRESSOR) 50 MG tablet Take 0.5 tablets (25 mg total) by mouth 2 (two) times daily.      . mirtazapine (REMERON) 30 MG tablet Take 1 tablet (30 mg total) by mouth at bedtime.  30 tablet  0  . QUEtiapine (SEROQUEL) 100 MG tablet Take 1 tablet (100 mg total) by mouth at bedtime.  30 tablet  0  . traMADol (ULTRAM) 50 MG tablet Take 50 mg by mouth every 6 (six) hours as needed for pain.        Psychiatric Specialty Exam:     Blood pressure 135/86, pulse 98, temperature 98 F (36.7 C), temperature source Oral, resp. rate 18, SpO2 96.00%.There is no weight on file to calculate BMI.  General Appearance: Casual  Eye Contact::  Good  Speech:  Clear and Coherent  Volume:  Normal  Mood:  Angry, Anxious, Depressed and Irritable   Affect:  Blunt, Depressed and Inappropriate  Thought Process:  Goal Directed  Orientation:  Full (Time, Place, and Person)  Thought Content:  WDL  Suicidal Thoughts:  Yes.  without intent/plan  Homicidal Thoughts:  No  Memory:  Immediate;   Good Recent;   Good Remote;   Good  Judgement:  Fair  Insight:  Fair  Psychomotor Activity:  Increased  Concentration:  Good  Recall:  Good  Akathisia:  NA  Handed:  Right  AIMS (if indicated):     Assets:  Communication Skills Desire for Improvement Resilience  Sleep:      Treatment Plan Summary: Involuntary Commitment Order: Admit to [facility] for stabilization secondary to active suicidal ideation and labile mood with inconsistent reporting.   Disposition:  Disposition Initial Assessment Completed for this Encounter: Yes Disposition of Patient: Inpatient treatment program Type of inpatient treatment program: Adult  Benjamine Mola, FNP-BC 04/02/2013 2:59 PM   Reviewed the information documented and agree with the treatment plan.  [name],Kevin R. 04/03/2013 2:28 PM

## 2013-04-02 NOTE — Progress Notes (Signed)
Patient has been referred to [facility].

## 2013-04-02 NOTE — ED Notes (Signed)
CBG 134 

## 2013-04-02 NOTE — Progress Notes (Signed)
B.Zhaniya Swallows, MHT spoke with patient again after being seen by Heloise Purpura, FNP who recommended Involuntary Commitment due to instability and suicidality. Patient provided another offer to seek treatment voluntarily and adamantly refused. Writer consulted with attending physician Dr. Christy Gentles and discussed recommended disposition to initiate IVC. Writer provided assistance with IVC process and informed attending nurse.

## 2013-04-03 ENCOUNTER — Encounter (HOSPITAL_COMMUNITY): Payer: Self-pay | Admitting: Psychiatry

## 2013-04-03 DIAGNOSIS — F4310 Post-traumatic stress disorder, unspecified: Secondary | ICD-10-CM

## 2013-04-03 DIAGNOSIS — I10 Essential (primary) hypertension: Secondary | ICD-10-CM

## 2013-04-03 DIAGNOSIS — F319 Bipolar disorder, unspecified: Secondary | ICD-10-CM

## 2013-04-03 DIAGNOSIS — F39 Unspecified mood [affective] disorder: Secondary | ICD-10-CM | POA: Diagnosis present

## 2013-04-03 DIAGNOSIS — IMO0001 Reserved for inherently not codable concepts without codable children: Secondary | ICD-10-CM

## 2013-04-03 DIAGNOSIS — E1165 Type 2 diabetes mellitus with hyperglycemia: Secondary | ICD-10-CM

## 2013-04-03 LAB — GLUCOSE, CAPILLARY
GLUCOSE-CAPILLARY: 200 mg/dL — AB (ref 70–99)
Glucose-Capillary: 135 mg/dL — ABNORMAL HIGH (ref 70–99)
Glucose-Capillary: 207 mg/dL — ABNORMAL HIGH (ref 70–99)
Glucose-Capillary: 197 mg/dL — ABNORMAL HIGH (ref 70–99)

## 2013-04-03 MED ORDER — METOPROLOL TARTRATE 50 MG PO TABS
50.0000 mg | ORAL_TABLET | Freq: Two times a day (BID) | ORAL | Status: DC
  Administered 2013-04-03 – 2013-04-10 (×14): 50 mg via ORAL
  Filled 2013-04-03 (×16): qty 1

## 2013-04-03 MED ORDER — NICOTINE 21 MG/24HR TD PT24
21.0000 mg | MEDICATED_PATCH | Freq: Every day | TRANSDERMAL | Status: DC
  Administered 2013-04-03 – 2013-04-09 (×7): 21 mg via TRANSDERMAL
  Filled 2013-04-03 (×10): qty 1

## 2013-04-03 MED ORDER — LURASIDONE HCL 40 MG PO TABS
40.0000 mg | ORAL_TABLET | Freq: Every day | ORAL | Status: DC
  Administered 2013-04-04 – 2013-04-06 (×3): 40 mg via ORAL
  Filled 2013-04-03 (×5): qty 1

## 2013-04-03 MED ORDER — ATORVASTATIN CALCIUM 40 MG PO TABS
40.0000 mg | ORAL_TABLET | Freq: Every day | ORAL | Status: DC
  Administered 2013-04-03 – 2013-04-10 (×8): 40 mg via ORAL
  Filled 2013-04-03 (×9): qty 1

## 2013-04-03 MED ORDER — GLIPIZIDE 5 MG PO TABS
5.0000 mg | ORAL_TABLET | Freq: Every day | ORAL | Status: DC
  Administered 2013-04-03 – 2013-04-09 (×7): 5 mg via ORAL
  Filled 2013-04-03 (×9): qty 1

## 2013-04-03 MED ORDER — QUETIAPINE FUMARATE 100 MG PO TABS
100.0000 mg | ORAL_TABLET | Freq: Every day | ORAL | Status: DC
  Filled 2013-04-03: qty 1

## 2013-04-03 MED ORDER — METFORMIN HCL 500 MG PO TABS
1000.0000 mg | ORAL_TABLET | Freq: Two times a day (BID) | ORAL | Status: DC
  Administered 2013-04-03 – 2013-04-10 (×15): 1000 mg via ORAL
  Filled 2013-04-03 (×17): qty 2

## 2013-04-03 MED ORDER — LAMOTRIGINE 25 MG PO TABS
25.0000 mg | ORAL_TABLET | ORAL | Status: DC
  Administered 2013-04-03 – 2013-04-05 (×2): 25 mg via ORAL
  Filled 2013-04-03 (×3): qty 1

## 2013-04-03 MED ORDER — NICOTINE 14 MG/24HR TD PT24: 14.0000 mg | MEDICATED_PATCH | Freq: Every day | TRANSDERMAL | Status: DC

## 2013-04-03 MED ORDER — TRAZODONE HCL 100 MG PO TABS
100.0000 mg | ORAL_TABLET | Freq: Every day | ORAL | Status: DC
  Administered 2013-04-03 – 2013-04-04 (×2): 100 mg via ORAL
  Filled 2013-04-03 (×3): qty 1

## 2013-04-03 MED ORDER — ALUM & MAG HYDROXIDE-SIMETH 200-200-20 MG/5ML PO SUSP
30.0000 mL | ORAL | Status: DC | PRN
  Administered 2013-04-04 – 2013-04-08 (×2): 30 mL via ORAL

## 2013-04-03 MED ORDER — ACETAMINOPHEN 325 MG PO TABS: 650.0000 mg | ORAL_TABLET | Freq: Four times a day (QID) | ORAL | Status: DC | PRN

## 2013-04-03 MED ORDER — DIVALPROEX SODIUM 500 MG PO DR TAB
500.0000 mg | DELAYED_RELEASE_TABLET | Freq: Every day | ORAL | Status: DC
  Administered 2013-04-04: 500 mg via ORAL
  Filled 2013-04-03 (×2): qty 1

## 2013-04-03 MED ORDER — METOPROLOL TARTRATE 25 MG PO TABS
25.0000 mg | ORAL_TABLET | Freq: Two times a day (BID) | ORAL | Status: DC
  Administered 2013-04-03: 25 mg via ORAL
  Filled 2013-04-03 (×3): qty 1

## 2013-04-03 MED ORDER — MAGNESIUM HYDROXIDE 400 MG/5ML PO SUSP: 30.0000 mL | Freq: Every day | ORAL | Status: DC | PRN

## 2013-04-03 MED ORDER — MIRTAZAPINE 30 MG PO TABS
30.0000 mg | ORAL_TABLET | Freq: Every day | ORAL | Status: DC
  Filled 2013-04-03: qty 1

## 2013-04-03 MED ORDER — DIVALPROEX SODIUM 500 MG PO DR TAB
500.0000 mg | DELAYED_RELEASE_TABLET | Freq: Two times a day (BID) | ORAL | Status: DC
  Administered 2013-04-03: 500 mg via ORAL
  Filled 2013-04-03 (×3): qty 1

## 2013-04-03 MED ORDER — INSULIN ASPART 100 UNIT/ML ~~LOC~~ SOLN
0.0000 [IU] | Freq: Three times a day (TID) | SUBCUTANEOUS | Status: DC
Start: 2013-04-03 — End: 2013-04-10
  Administered 2013-04-03: 3 [IU] via SUBCUTANEOUS
  Administered 2013-04-03: 5 [IU] via SUBCUTANEOUS
  Administered 2013-04-04 – 2013-04-05 (×5): 3 [IU] via SUBCUTANEOUS
  Administered 2013-04-06: 2 [IU] via SUBCUTANEOUS
  Administered 2013-04-06: 3 [IU] via SUBCUTANEOUS
  Administered 2013-04-06: 2 [IU] via SUBCUTANEOUS
  Administered 2013-04-07: 3 [IU] via SUBCUTANEOUS
  Administered 2013-04-08: 2 [IU] via SUBCUTANEOUS
  Administered 2013-04-09 (×2): 3 [IU] via SUBCUTANEOUS
  Administered 2013-04-10: 2 [IU] via SUBCUTANEOUS

## 2013-04-03 MED ORDER — LISINOPRIL 20 MG PO TABS
20.0000 mg | ORAL_TABLET | Freq: Every day | ORAL | Status: DC
  Administered 2013-04-03 – 2013-04-10 (×8): 20 mg via ORAL
  Filled 2013-04-03 (×9): qty 1
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
## 2013-04-03 NOTE — [facility] Group Notes (Signed)
[place] LCSW Group Therapy  04/03/2013 2:33 PM  Type of Therapy:  Group Therapy   Participation Level:  Active  Participation Quality:  Attentive  Affect:  Appropriate  Cognitive:  Alert and Oriented  Insight:  Engaged  Engagement in Therapy:  Engaged  Modes of Intervention:  Confrontation, Discussion, Education, Exploration, Problem-solving, Rapport Building, Socialization and Support  Summary of Progress/Problems: CSW discussed the [employer] as well as other resources in the community. Patient expressed interest in their programs and services and received information on their agency. Discussed pt's discharge plan and obstacles/barriers that may present in executing their discharge plans. Pt asked about [facility] and how to get set up with this service.    Smart, Oden Lindaman LCSWA  04/03/2013, 2:33 PM

## 2013-04-03 NOTE — Progress Notes (Signed)
Patient ID: [name], male   DOB: November 18, 1955, 58 y.o.   MRN: [id number] D: patient is pleasant; cooperative.  He denies any withdrawal symptoms.  He states the pain in his knee is a "5".  He denies SI/HI/AVH.  Patient rates his depression and hopelessness as a 5.  His goal after discharge is to "talk to someone if I feel depressed and stay on my medications."  Patient plans to return to his home upon discharge.  Patient did not attend Wellness group this morning.  A: continue to monitor medication management and MD orders.  Safety checks completed every 15 minutes per protocol.  Patient has order for his diabetic shoes.  R: patient is receptive and his behavior is appropriate.

## 2013-04-03 NOTE — Progress Notes (Signed)
[place] Group Notes:  (Nursing/MHT/Case Management/Adjunct)  Date:  04/03/2013  Time:  3:35 PM  Type of Therapy:  Psychoeducational Skills  Participation Level:  Active  Participation Quality:  Appropriate  Affect:  Appropriate  Cognitive:  Alert and Appropriate  Insight:  Appropriate  Engagement in Group:  Engaged  Modes of Intervention:  Discussion  Summary of Progress/Problems: In group today we discussed coping skills and played coping skills pictionary. Pt said that his most effective coping technique is "mental grounding"- like counting backwards from 10 to 1.   Harrie Foreman A 04/03/2013, 3:35 PM

## 2013-04-03 NOTE — Progress Notes (Signed)
Patient was in bed for the first part of the evening. His mood and affect appropriate. Endorsed feeling all right and denied SI/HI. Writer encouraged and supported patient. Patient receptive to encouragement and support. Q 15 minute check continues as ordered to maintain safety.
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
## 2013-04-03 NOTE — [facility] Suicide Risk Assessment (Signed)
[facility] INPATIENT:  Family/Significant Other Suicide Prevention Education  Suicide Prevention Education:  Patient Refusal for Family/Significant Other Suicide Prevention Education: The patient [name] has refused to provide written consent for family/significant other to be provided Family/Significant Other Suicide Prevention Education during admission and/or prior to discharge.  Physician notified.   SPE completed with pt. SPI pamphlet provided to pt and he was encouraged to share information with support network, ask questions, and talk about any concerns relating to SPE. Pt reports no access to guns and was given mobile crisis contact info.   Smart, Wm Fruchter LCSWA  04/03/2013, 10:55 AM

## 2013-04-03 NOTE — Consult Note (Signed)
[facility] Medical Consultation  [name] VZD:[id number] DOB: 1956-01-25 DOA: 04/02/2013 PCP: Default, Provider, MD   Requesting physician: [name] MD Date of consultation: 04/03/2013 Reason for consultation: Management of hypertension  Impression/Recommendations Active Problems:   Mood disorder Hypertension Type 2 Diabetes Mellitus   1. Hypertension. Patient with history of hypertension presently on 25 mg by mouth twice a day of metoprolol and 20 mg by mouth daily of lisinopril. Overnight he was noted to have elevated blood pressures with systolic blood pressures in the 160s. Possible that underlying stress and anxiety may be contributing to elevated blood pressures. Would recommend increasing his metoprolol dose to 50 mg by mouth twice a day, maintain current dose of lisinopril. Will continue monitoring blood pressures.  2. Type 2 diabetes mellitus. Blood sugars this morning stable at 135. Would recommend continuing glipizide 5 mg by mouth daily and metoprolol 1000 mg by mouth twice a day.  I will followup again tomorrow. Please contact me if I can be of assistance in the meanwhile. Thank you for this consultation.  Chief Complaint: Hypertension  HPI:  Patient is a 58 year old donor with a past medical history of major depression, type 2 diabetes mellitus, hypertension, who presented to the emergent apartment at [name] on 04/02/2013 with suicidal ideations. He reported having thoughts about taking a knife from the kitchen to "end it all". Over the last several weeks he has had increased anxiety, depressive symptoms, tearfulness. He was placed under involuntary commitment and admitted to the behavioral health unit. Medicine was consulted for management of blood pressures. He has a history of hypertension and is currently on metoprolol 25 mg twice a day and lisinopril 20 mg twice a day. Staff have noted elevated blood pressures overnight with systolic blood pressures  getting as high as 160. Patient states that his blood pressures are usually controlled with lisinopril and metoprolol, with blood pressure readings usually in the 120s over 60s. He feels that's current stressful situation was likely lead to increase in his blood pressures.                                    Review of Systems:  Constitutional:  No weight loss, night sweats, Fevers, chills, fatigue.  HEENT:  No headaches, Difficulty swallowing,Tooth/dental problems,Sore throat,  No sneezing, itching, ear ache, nasal congestion, post nasal drip,  Cardio-vascular:  No chest pain, Orthopnea, PND, swelling in lower extremities, anasarca, dizziness, palpitations  GI:  No heartburn, indigestion, abdominal pain, nausea, vomiting, diarrhea, change in bowel habits, loss of appetite  Resp:  No shortness of breath with exertion or at rest. No excess mucus, no productive cough, No non-productive cough, No coughing up of blood.No change in color of mucus.No wheezing.No chest wall deformity  Skin:  no rash or lesions.  GU:  no dysuria, change in color of urine, no urgency or frequency. No flank pain.  Musculoskeletal:  No joint pain or swelling. No decreased range of motion. No back pain.  Psych:  No change in mood or affect. No depression or anxiety. No memory loss.    Past Medical History  Diagnosis Date  . Arthritis   . Diabetes mellitus   . Hypertension   . Depression   . High cholesterol   . GERD (gastroesophageal reflux disease)   . Neuropathic pain   . Cancer    History reviewed. No pertinent past surgical history. Social History:  reports  that he has been smoking Cigarettes.  He has a 10 pack-year smoking history. He does not have any smokeless tobacco history on file. He reports that he does not drink alcohol or use illicit drugs.  Allergies  Allergen Reactions  . Morphine And Related     Pt unable to take narcotics.    History reviewed. No pertinent family history.  Prior to  Admission medications   Medication Sig Start Date End Date Taking? Authorizing Provider  atorvastatin (LIPITOR) 80 MG tablet Take 0.5 tablets (40 mg total) by mouth daily. 11/07/12  Yes Elmarie Shiley, NP  divalproex (DEPAKOTE) 500 MG DR tablet Take 500 mg by mouth 2 (two) times daily.   Yes Historical Provider, MD  glipiZIDE (GLUCOTROL) 5 MG tablet Take 1 tablet (5 mg total) by mouth daily before lunch. 11/07/12  Yes Elmarie Shiley, NP  lisinopril (PRINIVIL,ZESTRIL) 20 MG tablet Take 1 tablet (20 mg total) by mouth daily. 11/07/12  Yes Elmarie Shiley, NP  metFORMIN (GLUCOPHAGE) 1000 MG tablet Take 1 tablet (1,000 mg total) by mouth 2 (two) times daily with a meal. 11/07/12  Yes Elmarie Shiley, NP  metoprolol (LOPRESSOR) 50 MG tablet Take 0.5 tablets (25 mg total) by mouth 2 (two) times daily. 11/07/12  Yes Elmarie Shiley, NP  mirtazapine (REMERON) 30 MG tablet Take 1 tablet (30 mg total) by mouth at bedtime. 11/07/12  Yes Elmarie Shiley, NP  QUEtiapine (SEROQUEL) 100 MG tablet Take 1 tablet (100 mg total) by mouth at bedtime. 11/07/12  Yes Elmarie Shiley, NP  traMADol (ULTRAM) 50 MG tablet Take 50 mg by mouth every 6 (six) hours as needed for pain.   Yes Historical Provider, MD   Physical Exam: Blood pressure 139/108, pulse 105, temperature 97.9 F (36.6 C), temperature source Oral, resp. rate 20, height 5\' 7"  (1.702 m), weight 98.09 kg (216 lb 4 oz). Filed Vitals:   04/03/13 0701  BP: 139/108  Pulse: 105  Temp:   Resp:      General:  He is in no acute distress, awake alert oriented x3. Pleasant, responding to questions appropriately.  Eyes: Extraocular movements intact, pupils equal reactive to light no sclera icterus  Neck: Supple symmetrical no jugular distention or carotid bruits  Cardiovascular: Regular rate and rhythm normal S1-S2 no extremity edema  Respiratory: Clear to auscultation bilaterally no wheezing rhonchi or  Abdomen: He has mild generalized pain to palpation, no rebound tenderness or  guarding  Skin: No rashes or lesion  Musculoskeletal: Patient endorses chronic left knee pain  Psychiatric: Awake alert oriented, presently not suicidal or homicidal.  Neurologic: He is a nonfocal neurologic examination, cranial nerves II-XII intact no alteration to sensation global 5 of 5 muscle strength  Labs on Admission:  Basic Metabolic Panel:  Recent Labs Lab 03/30/13 1205  NA 142  K 4.6  CL 106  CO2 23  GLUCOSE 171*  BUN 11  CREATININE 0.92  CALCIUM 10.5   Liver Function Tests:  Recent Labs Lab 03/30/13 1205  AST 17  ALT 14  ALKPHOS 79  BILITOT 0.3  PROT 7.8  ALBUMIN 4.1   No results found for this basename: LIPASE, AMYLASE,  in the last 168 hours No results found for this basename: AMMONIA,  in the last 168 hours CBC:  Recent Labs Lab 03/30/13 1205  WBC 4.9  HGB 16.0  HCT 44.9  MCV 82.1  PLT 206   Cardiac Enzymes: No results found for this basename: CKTOTAL, CKMB, CKMBINDEX, TROPONINI,  in the last  168 hours BNP: No components found with this basename: POCBNP,  CBG:  Recent Labs Lab 03/31/13 1008 03/31/13 1836 04/01/13 0701 04/02/13 2209 04/03/13 0634  GLUCAP 223* 210* 128* 206* 135*    Radiological Exams on Admission: No results found.  EKG: Independently reviewed.   Time spent: 50 minutes  Kelvin Cellar [facility] Pager [phone]  If 7PM-7AM, please contact night-coverage [email or web address] Password [facility] 04/03/2013, 12:26 PM

## 2013-04-03 NOTE — [facility] Group Notes (Signed)
Adult Psychoeducational Group Note  Date:  04/03/2013 Time:  11:28 PM  Group Topic/Focus:  Wrap-Up Group:   The focus of this group is to help patients review their daily goal of treatment and discuss progress on daily workbooks.  Participation Level:  Did Not Attend  Participation Quality:  None  Affect:  None  Cognitive:  None  Insight: None  Engagement in Group:  None  Modes of Intervention:  Discussion  Additional Comments:  Bryne did not attend group.  Victorino Sparrow A 04/03/2013, 11:28 PM

## 2013-04-04 DIAGNOSIS — F411 Generalized anxiety disorder: Secondary | ICD-10-CM | POA: Diagnosis not present

## 2013-04-04 DIAGNOSIS — F4310 Post-traumatic stress disorder, unspecified: Secondary | ICD-10-CM | POA: Diagnosis not present

## 2013-04-04 DIAGNOSIS — F319 Bipolar disorder, unspecified: Secondary | ICD-10-CM | POA: Diagnosis not present

## 2013-04-04 LAB — GLUCOSE, CAPILLARY
GLUCOSE-CAPILLARY: 169 mg/dL — AB (ref 70–99)
GLUCOSE-CAPILLARY: 162 mg/dL — AB (ref 70–99)
Glucose-Capillary: 134 mg/dL — ABNORMAL HIGH (ref 70–99)
Glucose-Capillary: 172 mg/dL — ABNORMAL HIGH (ref 70–99)
Glucose-Capillary: 175 mg/dL — ABNORMAL HIGH (ref 70–99)

## 2013-04-04 LAB — HEMOGLOBIN A1C
HEMOGLOBIN A1C: 8.4 % — AB (ref <5.7)
MEAN PLASMA GLUCOSE: 194 mg/dL — AB (ref <117)

## 2013-04-04 NOTE — BHH Group Notes (Signed)
[place] LCSW Group Therapy  04/04/2013 3:18 PM  Type of Therapy:  Group Therapy  Participation Level:  Active  Participation Quality:  Attentive  Affect:  Appropriate  Cognitive:  Alert and Oriented  Insight:  Engaged  Engagement in Therapy:  Engaged  Modes of Intervention:  Confrontation, Discussion, Education, Exploration, Problem-solving, Rapport Building, Socialization and Support  Summary of Progress/Problems: Emotion Regulation: This group focused on both positive and negative emotion identification and allowed group members to process ways to identify feelings, regulate negative emotions, and find healthy ways to manage internal/external emotions. Group members were asked to reflect on a time when their reaction to an emotion led to a negative outcome and explored how alternative responses using emotion regulation would have benefited them. Group members were also asked to discuss a time when emotion regulation was utilized when a negative emotion was experienced. [name] was attentive and engaged throughout today's therapy group. He shared that he struggles with anxiety and depression. [name] was able to identify what these emotions felt like both physically and emotionally. [name] shows progress in the group setting and improving insight AEB his ability to process how "deep breathing, prayer, and meditation" can help him regulate these negative emotions without turning to drugs or alcohol. 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    Smart, Axcel Horsch LCSWA  04/04/2013, 3:18 PM

## 2013-04-04 NOTE — Progress Notes (Signed)
[facility] MD Progress Note  04/04/2013 5:11 PM Byrl Wint  MRN:  [id number] Subjective:  [name] states that he is wanting to pursue the medication changes as he did not feel the Depakote/Sroquel was working for him. He states he was always feeling down, sedated. He is wanting to give this combination a good try. States he pretty much has not support out there. States that some of his people including his mother keep bringing the past past. They do not validate that he is not drinking or using drugs and that behavior is long gone. He is pretty much living in isolation Diagnosis:   DSM5: Schizophrenia Disorders:  none Obsessive-Compulsive Disorders:  none Trauma-Stressor Disorders:  Posttraumatic Stress Disorder (309.81) Substance/Addictive Disorders:  Not currently Depressive Disorders:  Major Depressive Disorder - Severe (296.23) Total Time spent with patient: 30 minutes  Axis I: Bipolar, Depressed  ADL's:  Intact  Sleep: Fair  Appetite:  Fair  Suicidal Ideation:  Plan:  denies Intent:  denies Means:  denies Homicidal Ideation:  Plan:  denies Intent:  denies Means:  denied AEB (as evidenced by):  Psychiatric Specialty Exam: Physical Exam  Review of Systems  Constitutional: Positive for malaise/fatigue.  HENT: Negative.   Eyes: Negative.   Respiratory: Negative.   Cardiovascular: Negative.   Gastrointestinal: Negative.   Genitourinary: Negative.   Musculoskeletal: Negative.   Skin: Negative.   Neurological: Positive for weakness.  Endo/Heme/Allergies: Negative.   Psychiatric/Behavioral: Positive for depression. The patient is nervous/anxious.     Blood pressure 130/89, pulse 90, temperature 98 F (36.7 C), temperature source Oral, resp. rate 20, height 5\' 7"  (1.702 m), weight 98.09 kg (216 lb 4 oz).Body mass index is 33.86 kg/(m^2).  General Appearance: Fairly Groomed  Engineer, water::  Fair  Speech:  Clear and Coherent and rapid  Volume:  fluctuates  Mood:   Anxious, Depressed and worried  Affect:  anxious, worried  Thought Process:  Coherent and Goal Directed  Orientation:  Full (Time, Place, and Person)  Thought Content:  symtpoms, worries, concerns  Suicidal Thoughts:  No  Homicidal Thoughts:  No  Memory:  Immediate;   Fair Recent;   Fair Remote;   Fair  Judgement:  Fair  Insight:  Present  Psychomotor Activity:  Restlessness  Concentration:  Fair  Recall:  [employer]: Fair  Akathisia:  No  Handed:    AIMS (if indicated):     Assets:  Desire for Improvement  Sleep:  Number of Hours: 6   Musculoskeletal: Strength & Muscle Tone: within normal limits Gait & Station: normal Patient leans: N/A  Current Medications: Current Facility-Administered Medications  Medication Dose Route Frequency Provider Last Rate Last Dose  . acetaminophen (TYLENOL) tablet 650 mg  650 mg Oral Q6H PRN Laverle Hobby, PA-C      . alum & mag hydroxide-simeth (MAALOX/MYLANTA) 200-200-20 MG/5ML suspension 30 mL  30 mL Oral Q4H PRN Laverle Hobby, PA-C   30 mL at 04/04/13 1707  . atorvastatin (LIPITOR) tablet 40 mg  40 mg Oral Daily Laverle Hobby, PA-C   40 mg at 04/04/13 [id number]  . divalproex (DEPAKOTE) DR tablet 500 mg  500 mg Oral QHS Nicholaus Bloom, MD      . glipiZIDE (GLUCOTROL) tablet 5 mg  5 mg Oral QAC lunch Laverle Hobby, PA-C   5 mg at 04/04/13 1147  . insulin aspart (novoLOG) injection 0-15 Units  0-15 Units Subcutaneous TID WC Laverle Hobby, PA-C  3 Units at 04/04/13 1708  . lamoTRIgine (LAMICTAL) tablet 25 mg  25 mg Oral QODAY Encarnacion Slates, NP   25 mg at 04/03/13 1300  . lisinopril (PRINIVIL,ZESTRIL) tablet 20 mg  20 mg Oral Daily Laverle Hobby, PA-C   20 mg at 04/04/13 7106  . lurasidone (LATUDA) tablet 40 mg  40 mg Oral Q breakfast Encarnacion Slates, NP   40 mg at 04/04/13 2694  . magnesium hydroxide (MILK OF MAGNESIA) suspension 30 mL  30 mL Oral Daily PRN Laverle Hobby, PA-C      . metFORMIN (GLUCOPHAGE)  tablet 1,000 mg  1,000 mg Oral BID WC Laverle Hobby, PA-C   1,000 mg at 04/04/13 1707  . metoprolol (LOPRESSOR) tablet 50 mg  50 mg Oral BID Kelvin Cellar, MD   50 mg at 04/04/13 1707  . nicotine (NICODERM CQ - dosed in mg/24 hours) patch 21 mg  21 mg Transdermal Q0600 Laverle Hobby, PA-C   21 mg at 04/04/13 8546  . traZODone (DESYREL) tablet 100 mg  100 mg Oral QHS Encarnacion Slates, NP   100 mg at 04/03/13 2112    Lab Results:  Results for orders placed during the hospital encounter of 04/02/13 (from the past 48 hour(s))  GLUCOSE, CAPILLARY     Status: Abnormal   Collection Time    04/02/13 10:09 PM      Result Value Ref Range   Glucose-Capillary 206 (*) 70 - 99 mg/dL   Comment 1 Notify RN    GLUCOSE, CAPILLARY     Status: Abnormal   Collection Time    04/03/13  6:34 AM      Result Value Ref Range   Glucose-Capillary 135 (*) 70 - 99 mg/dL  GLUCOSE, CAPILLARY     Status: Abnormal   Collection Time    04/03/13 12:05 PM      Result Value Ref Range   Glucose-Capillary 200 (*) 70 - 99 mg/dL  GLUCOSE, CAPILLARY     Status: Abnormal   Collection Time    04/03/13  5:26 PM      Result Value Ref Range   Glucose-Capillary 207 (*) 70 - 99 mg/dL   Comment 1 Notify RN    HEMOGLOBIN A1C     Status: Abnormal   Collection Time    04/03/13  7:48 PM      Result Value Ref Range   Hemoglobin A1C 8.4 (*) <5.7 %   Comment: (NOTE)                                                                               According to the ADA Clinical Practice Recommendations for 2011, when     HbA1c is used as a screening test:      >=6.5%   Diagnostic of Diabetes Mellitus               (if abnormal result is confirmed)     5.7-6.4%   Increased risk of developing Diabetes Mellitus     References:Diagnosis and Classification of Diabetes Mellitus,Diabetes     EVOJ,5009,38(HWEXH 1):S62-S69 and Standards of Medical Care in  Diabetes - 2011,Diabetes Care,2011,34 (Suppl 1):S11-S61.   Mean Plasma  Glucose 194 (*) <117 mg/dL   Comment: Performed at [facility], CAPILLARY     Status: Abnormal   Collection Time    04/03/13  8:48 PM      Result Value Ref Range   Glucose-Capillary 197 (*) 70 - 99 mg/dL  GLUCOSE, CAPILLARY     Status: Abnormal   Collection Time    04/04/13  6:18 AM      Result Value Ref Range   Glucose-Capillary 172 (*) 70 - 99 mg/dL  GLUCOSE, CAPILLARY     Status: Abnormal   Collection Time    04/04/13 11:37 AM      Result Value Ref Range   Glucose-Capillary 175 (*) 70 - 99 mg/dL  GLUCOSE, CAPILLARY     Status: Abnormal   Collection Time    04/04/13  5:01 PM      Result Value Ref Range   Glucose-Capillary 169 (*) 70 - 99 mg/dL    Physical Findings: AIMS: Facial and Oral Movements Muscles of Facial Expression: None, normal Lips and Perioral Area: None, normal Jaw: None, normal Tongue: None, normal,Extremity Movements Upper (arms, wrists, hands, fingers): None, normal Lower (legs, knees, ankles, toes): None, normal, Trunk Movements Neck, shoulders, hips: None, normal, Overall Severity Severity of abnormal movements (highest score from questions above): None, normal Incapacitation due to abnormal movements: None, normal Patient's awareness of abnormal movements (rate only patient's report): No Awareness, Dental Status Current problems with teeth and/or dentures?: No Does patient usually wear dentures?: No  CIWA:  CIWA-Ar Total: 1 COWS:  COWS Total Score: 1  Treatment Plan Summary: Daily contact with patient to assess and evaluate symptoms and progress in treatment Medication management  Plan: Supportive approach/coping skills/relapse prevention           Continue to optimize treatment with Latuda, Lamictal  Medical Decision Making Problem Points:  Review of psycho-social stressors (1) Data Points:  Review of medication regiment & side effects (2) Review of new medications or change in dosage (2)  I certify that inpatient services  furnished can reasonably be expected to improve the patient's condition.   Lilyauna Miedema A 04/04/2013, 5:11 PM

## 2013-04-04 NOTE — BHH Group Notes (Signed)
[facility] Group Notes:  (Nursing/MHT/Case Management/Adjunct)  Date: 04/03/2013   Time:  0900 am  Type of Therapy:  Psychoeducational Skills  Participation Level:  Did Not Attend   [name] 04/04/2013, 10:26 AM

## 2013-04-04 NOTE — Progress Notes (Signed)
Recreation Therapy Notes  Date: 02.18.2015 Time: 2:45pm Location: [address] Dayroom    Group Topic: Boundaries  Goal Area(s) Addresses:  Patient will identify benefit of establishing healthy boundaries.  Patient will identify what is preventing establishing healthy boundaries.   Behavioral Response: Did not attend.  Laureen Ochs Elois Averitt, LRT/CTRS   Covey Baller L 04/04/2013 7:06 PM

## 2013-04-04 NOTE — BHH Group Notes (Signed)
[facility] LCSW Aftercare Discharge Planning Group Note   04/04/2013 10:42 AM  Participation Quality:  Appropriate   Mood/Affect:  Appropriate  Depression Rating:  5  Anxiety Rating:  6  Thoughts of Suicide:  No Will you contract for safety?   NA  Current AVH:  No  Plan for Discharge/Comments:  Pt reports that he is being put on new meds by doctor to help with mood stabilization. Pt reports no substance abuse for past two years. Pt sees [name] at [facility] and has appt scheduled for March that he would like to keep. Pt refusing referral for therapy but is interested in [place] groups and was given information and contact info for this resource.  Transportation Means: [place]? Pt must be set up with this service at least a day before d/c.   Supports: some family supports identified.   Smart, [employer]

## 2013-04-04 NOTE — Progress Notes (Signed)
BP stable and CBG reasonably well controlled. Continue Lisinopril 20 mg PO QD and Metoprolol 50 mg BID. If BP persistently higher > 140/90, consider increasing dose of Lisinopril first. A1C is 8.4, agree with continuing Glipizide and Metformin for diabetes control.   Will sign off and please call me if you have additional questions.   Faye Ramsay, MD  [facility] Pager [phone] Cell [phone]  If 7PM-7AM, please contact night-coverage [email or web address] Password [email or web address]

## 2013-04-04 NOTE — Progress Notes (Signed)
NUTRITION ASSESSMENT  Pt identified as at risk on the Malnutrition Screen Tool  INTERVENTION: 1. Educated patient on the importance of nutrition and encouraged intake of food and beverages. 2. Discussed weight goals. 3. Supplements: none at this time.  NUTRITION DIAGNOSIS: Unintentional weight loss related to sub-optimal intake as evidenced by pt report.   Goal: Pt to meet >/= 90% of their estimated nutrition needs.  Monitor:  PO intake  Assessment:  Patient admitted with major depression and SI.  Patient with a hx of DM.  Meds include glucotrol, glucophage, and insulin. Patient reports good intake currently and fair prior to admit.  UBW 200 lbs and statues that he things that he has lost weight but e-chart indicates weight gain.     58 y.o. male  Height: Ht Readings from Last 1 Encounters:  04/02/13 5\' 7"  (1.702 m)    Weight: Wt Readings from Last 1 Encounters:  04/02/13 216 lb 4 oz (98.09 kg)    Weight Hx: Wt Readings from Last 10 Encounters:  04/02/13 216 lb 4 oz (98.09 kg)  11/03/12 204 lb (92.534 kg)  03/23/12 211 lb (95.709 kg)    BMI:  Body mass index is 33.86 kg/(m^2). Pt meets criteria for obesity grade 1 based on current BMI.  Estimated Nutritional Needs: Kcal: 25-30 kcal/kg Protein: > 1 gram protein/kg Fluid: 1 ml/kcal  Diet Order: Carb Control Pt is also offered choice of unit snacks mid-morning and mid-afternoon.  Pt is eating as desired.   Lab results and medications reviewed.   Antonieta Iba, RD, LDN Clinical Inpatient Dietitian Pager:  [phone] Weekend and after hours pager:  [phone]
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
## 2013-04-04 NOTE — Progress Notes (Signed)
Patient ID: [name], male   DOB: December 26, 1955, 57 y.o.   MRN: [id number] D: pt. In room reports depression at "6" of 10. Reports groups helpful "learn coping skills, talking about taking time out" and helpful talking to people and meditating and prayer. A: Writer encouraged pt. To continue using positive coping skills. Staff will monitor q88min for safety. R: Pt. Is safe on the unit.

## 2013-04-04 NOTE — Progress Notes (Signed)
Pt attended NA group meeting

## 2013-04-04 NOTE — Progress Notes (Signed)
Patient ID: [name], male   DOB: 1955/09/29, 58 y.o.   MRN: [id number] D: Patient mood has improved from yesterday.  He reports his depressive symptoms are now a 6; hopelessness as a 5.  He denies any SI/HI/AVH.  MD has completed his medical consult and left suggestions for further medication management.  Patient reports that his appetite is improving and he sleeping "fair".  When he is discharge he plans to "talk to someone if I have any thoughts of depression.  Patient has no complaints/concerns today. Patient remains ambulatory with a cane.  CBGs continued for glucose control.  A: continue to monitor medication management and MD orders.  Safety checks completed every 15 minutes per protocol.  R: patient is receptive to staff; his behavior is appropriate.

## 2013-04-05 DIAGNOSIS — F4310 Post-traumatic stress disorder, unspecified: Secondary | ICD-10-CM | POA: Diagnosis not present

## 2013-04-05 DIAGNOSIS — F319 Bipolar disorder, unspecified: Secondary | ICD-10-CM | POA: Diagnosis not present

## 2013-04-05 LAB — GLUCOSE, CAPILLARY
Glucose-Capillary: 154 mg/dL — ABNORMAL HIGH (ref 70–99)
Glucose-Capillary: 157 mg/dL — ABNORMAL HIGH (ref 70–99)
Glucose-Capillary: 86 mg/dL (ref 70–99)

## 2013-04-05 MED ORDER — TRAZODONE HCL 100 MG PO TABS
100.0000 mg | ORAL_TABLET | Freq: Every evening | ORAL | Status: DC | PRN
  Administered 2013-04-05 – 2013-04-06 (×2): 100 mg via ORAL
  Filled 2013-04-05 (×2): qty 1

## 2013-04-05 MED ORDER — LAMOTRIGINE 25 MG PO TABS
25.0000 mg | ORAL_TABLET | Freq: Every day | ORAL | Status: DC
  Administered 2013-04-06 – 2013-04-10 (×5): 25 mg via ORAL
  Filled 2013-04-05 (×7): qty 1

## 2013-04-05 NOTE — Progress Notes (Signed)
[facility] MD Progress Note  04/05/2013 6:14 PM [name]  MRN:  [id number] Subjective:  States he is adjusting to the new medications. He is still having mood insatbility. Wants to get off the Depakote. States he never liked the way it made him feel. Looks forward to see how these new medications are going to work for him.  Diagnosis:   DSM5: Schizophrenia Disorders:  none Obsessive-Compulsive Disorders:  none Trauma-Stressor Disorders:  Posttraumatic Stress Disorder (309.81) Substance/Addictive Disorders:  In remission for 2 years Depressive Disorders:  Major Depressive Disorder - Severe (296.23) Total Time spent with patient: 30 minutes  Axis I: Bipolar disorder  ADL's:  Intact  Sleep: Poor  Appetite:  Fair  Suicidal Ideation:  Plan:  denies Intent:  denies Means:  denies Homicidal Ideation:  Plan:  denies Intent:  denies Means:  denies AEB (as evidenced by):  Psychiatric Specialty Exam: Physical Exam  Review of Systems  Constitutional: Negative.   HENT: Negative.   Eyes: Negative.   Respiratory: Negative.   Cardiovascular: Negative.   Gastrointestinal: Negative.   Genitourinary: Negative.   Musculoskeletal: Positive for back pain and joint pain.  Skin: Negative.   Neurological: Negative.   Endo/Heme/Allergies: Negative.   Psychiatric/Behavioral: Positive for depression. The patient is nervous/anxious.     Blood pressure 126/85, pulse 90, temperature 98.5 F (36.9 C), temperature source Oral, resp. rate 18, height 5\' 7"  (1.702 m), weight 98.09 kg (216 lb 4 oz).Body mass index is 33.86 kg/(m^2).  General Appearance: Fairly Groomed  Engineer, water::  Fair  Speech:  Clear and Coherent  Volume:  fluctuates  Mood:  Anxious, Depressed and worried  Affect:  anxious, worried  Thought Process:  Coherent and Goal Directed  Orientation:  Full (Time, Place, and Person)  Thought Content:  symptoms, worries, concerns  Suicidal Thoughts:  No  Homicidal Thoughts:  No   Memory:  Immediate;   Fair Recent;   Fair Remote;   Fair  Judgement:  Fair  Insight:  Present  Psychomotor Activity:  Restlessness  Concentration:  Fair  Recall:  [employer] [place]: Fair  Akathisia:  No  Handed:    AIMS (if indicated):     Assets:  Desire for Improvement  Sleep:  Number of Hours: 6.5   Musculoskeletal: Strength & Muscle Tone: within normal limits Gait & Station: unsteady Patient leans: N/A  Current Medications: Current Facility-Administered Medications  Medication Dose Route Frequency Provider Last Rate Last Dose  . acetaminophen (TYLENOL) tablet 650 mg  650 mg Oral Q6H PRN Laverle Hobby, PA-C      . alum & mag hydroxide-simeth (MAALOX/MYLANTA) 200-200-20 MG/5ML suspension 30 mL  30 mL Oral Q4H PRN Laverle Hobby, PA-C   30 mL at 04/04/13 1707  . atorvastatin (LIPITOR) tablet 40 mg  40 mg Oral Daily Laverle Hobby, PA-C   40 mg at 04/05/13 0759  . glipiZIDE (GLUCOTROL) tablet 5 mg  5 mg Oral QAC lunch Laverle Hobby, PA-C   5 mg at 04/05/13 1154  . insulin aspart (novoLOG) injection 0-15 Units  0-15 Units Subcutaneous TID WC Laverle Hobby, PA-C   3 Units at 04/05/13 1156  . lamoTRIgine (LAMICTAL) tablet 25 mg  25 mg Oral Charlie Pitter, NP   25 mg at 04/05/13 0759  . lisinopril (PRINIVIL,ZESTRIL) tablet 20 mg  20 mg Oral Daily Laverle Hobby, PA-C   20 mg at 04/05/13 0758  . lurasidone (LATUDA) tablet 40 mg  40 mg Oral Q breakfast Encarnacion Slates, NP   40 mg at 04/05/13 0800  . magnesium hydroxide (MILK OF MAGNESIA) suspension 30 mL  30 mL Oral Daily PRN Laverle Hobby, PA-C      . metFORMIN (GLUCOPHAGE) tablet 1,000 mg  1,000 mg Oral BID WC Laverle Hobby, PA-C   1,000 mg at 04/05/13 1717  . metoprolol (LOPRESSOR) tablet 50 mg  50 mg Oral BID Kelvin Cellar, MD   50 mg at 04/05/13 1717  . nicotine (NICODERM CQ - dosed in mg/24 hours) patch 21 mg  21 mg Transdermal Q0600 Laverle Hobby, PA-C   21 mg at 04/05/13 0640  .  traZODone (DESYREL) tablet 100 mg  100 mg Oral QHS PRN Nicholaus Bloom, MD        Lab Results:  Results for orders placed during the hospital encounter of 04/02/13 (from the past 48 hour(s))  HEMOGLOBIN A1C     Status: Abnormal   Collection Time    04/03/13  7:48 PM      Result Value Ref Range   Hemoglobin A1C 8.4 (*) <5.7 %   Comment: (NOTE)                                                                               According to the ADA Clinical Practice Recommendations for 2011, when     HbA1c is used as a screening test:      >=6.5%   Diagnostic of Diabetes Mellitus               (if abnormal result is confirmed)     5.7-6.4%   Increased risk of developing Diabetes Mellitus     References:Diagnosis and Classification of Diabetes Mellitus,Diabetes     ZOXW,9604,54(UJWJX 1):S62-S69 and Standards of Medical Care in             Diabetes - 2011,Diabetes Care,2011,34 (Suppl 1):S11-S61.   Mean Plasma Glucose 194 (*) <117 mg/dL   Comment: Performed at [place], [facility]     Status: Abnormal   Collection Time    04/03/13  8:48 PM      Result Value Ref Range   Glucose-Capillary 197 (*) 70 - 99 mg/dL  GLUCOSE, CAPILLARY     Status: Abnormal   Collection Time    04/04/13  6:18 AM      Result Value Ref Range   Glucose-Capillary 172 (*) 70 - 99 mg/dL  GLUCOSE, CAPILLARY     Status: Abnormal   Collection Time    04/04/13 11:37 AM      Result Value Ref Range   Glucose-Capillary 175 (*) 70 - 99 mg/dL  GLUCOSE, CAPILLARY     Status: Abnormal   Collection Time    04/04/13  5:01 PM      Result Value Ref Range   Glucose-Capillary 169 (*) 70 - 99 mg/dL  GLUCOSE, CAPILLARY     Status: Abnormal   Collection Time    04/04/13  9:28 PM      Result Value Ref Range   Glucose-Capillary 162 (*) 70 - 99 mg/dL   Comment 1 Notify RN    GLUCOSE, CAPILLARY  Status: Abnormal   Collection Time    04/05/13  6:16 AM      Result Value Ref Range   Glucose-Capillary 154 (*) 70 -  99 mg/dL  GLUCOSE, CAPILLARY     Status: Abnormal   Collection Time    04/05/13 11:39 AM      Result Value Ref Range   Glucose-Capillary 157 (*) 70 - 99 mg/dL  GLUCOSE, CAPILLARY     Status: None   Collection Time    04/05/13  4:50 PM      Result Value Ref Range   Glucose-Capillary 86  70 - 99 mg/dL    Physical Findings: AIMS: Facial and Oral Movements Muscles of Facial Expression: None, normal Lips and Perioral Area: None, normal Jaw: None, normal Tongue: None, normal,Extremity Movements Upper (arms, wrists, hands, fingers): None, normal Lower (legs, knees, ankles, toes): None, normal, Trunk Movements Neck, shoulders, hips: None, normal, Overall Severity Severity of abnormal movements (highest score from questions above): None, normal Incapacitation due to abnormal movements: None, normal Patient's awareness of abnormal movements (rate only patient's report): No Awareness, Dental Status Current problems with teeth and/or dentures?: No Does patient usually wear dentures?: No  CIWA:  CIWA-Ar Total: 1 COWS:  COWS Total Score: 1  Treatment Plan Summary: Daily contact with patient to assess and evaluate symptoms and progress in treatment Medication management  Plan: Supportive approach/coping skills/relapse prevention           D/C the Depakote           Increase the Lamictal           Reassess the Latuda dose  Medical Decision Making Problem Points:  Review of psycho-social stressors (1) Data Points:  Review of medication regiment & side effects (2) Review of new medications or change in dosage (2)  I certify that inpatient services furnished can reasonably be expected to improve the patient's condition.   [name] A 04/05/2013, 6:14 PM

## 2013-04-05 NOTE — BHH Group Notes (Signed)
[place] LCSW Group Therapy  04/05/2013 2:20 PM  Type of Therapy:  Group Therapy  Participation Level:  Active  Participation Quality:  Attentive  Affect:  Appropriate  Cognitive:  Alert and Oriented  Insight:  Engaged  Engagement in Therapy:  Engaged  Modes of Intervention:  Confrontation, Discussion, Education, Exploration, Problem-solving, Rapport Building, Socialization and Support  Summary of Progress/Problems:  Finding Balance in Life. Today's group focused on defining balance in one's own words, identifying things that can knock one off balance, and exploring healthy ways to maintain balance in life. Group members were asked to provide an example of a time when they felt off balance, describe how they handled that situation,and process healthier ways to regain balance in the future. Group members were asked to share the most important tool for maintaining balance that they learned while at [facility] and how they plan to apply this method after discharge. [name] was attentive and engaged throughout today's therapy group. [name] shared that for him, balance means "having a routine, taking my medication and going to appts, eating right, taking care of little things like getting my mail, and paying bills on time." [name] shows progress in the group setting and improving insight AEB his ability to process how deep breathing, meditation, and going for daily walks on nice days can help him maintain a sense of balance and peace of mind.    Smart, HeatherLCSWA  04/05/2013, 2:20 PM

## 2013-04-05 NOTE — Progress Notes (Signed)
[place] Group Notes:  (Nursing/MHT/Case Management/Adjunct)  Date:  04/05/2013  Time:  2100 Type of Therapy:  wrap up group  Participation Level:  Active  Participation Quality:  Appropriate, Attentive, Sharing and Supportive  Affect:  Appropriate  Cognitive:  Alert and Appropriate  Insight:  Appropriate  Engagement in Group:  Engaged  Modes of Intervention:  Clarification, Education and Support  Summary of Progress/Problems:Pt reports doing nothing constructive as of late.  Pt wants to get back into an appropriate routine of everyday life.  Pt also wants to pursue more healthy communications with his family and attend an [place] meeting everyday.    Jacques Navy 04/05/2013, 10:44 PM

## 2013-04-05 NOTE — Progress Notes (Signed)
Recreation Therapy Notes  Date: 02.19.2015 Time: 2:45pm Location: [address] Dayroom   Group Topic: Communication, Team Building, Problem Solving  Goal Area(s) Addresses:  Patient will effectively work with peer towards shared goal.  Patient will identify skill used to make activity successful.  Patient will identify how skills used during activity can be used to reach post d/c goals.   Behavioral Response: Did not attend.   Laureen Ochs Jaymond Waage, LRT/CTRS  Lane Hacker 04/05/2013 8:58 PM

## 2013-04-05 NOTE — Progress Notes (Addendum)
The focus of this group is to educate the patient on the purpose and policies of crisis stabilization and provide a format to answer questions about their admission.  The group details unit policies and expectations of patients while admitted.   He was an active participant in group his goal for today "be more assertive others may have in-pt" and for me to move forward.

## 2013-04-05 NOTE — Progress Notes (Signed)
Patient ID: [name], male   DOB: [birth year], 58 y.o.   MRN: [id number] D: Patient up and interacting on unit. Pt reports decreased anxiety and depressive symptoms. Pt denies suicidal /homicidal ideation intent and plan. Pt denies auditory and visual hallucination. Pt denies any needs or concerns. Cooperative with assessment. No acute distressed noted at this time.   A: Met with pt 1:1. Medications administered as prescribed. Writer encouraged pt to discuss feelings. Pt encouraged to come to staff with any questions or concerns.   R: Patient is safe on the unit. He is complaint with medications and denies any adverse reaction. Continue current POC.

## 2013-04-05 NOTE — Progress Notes (Signed)
Pt has been up on the unit today.  He rated his depression, hopelessness, and anxiety on her self-inventory.  He denies S/H ideation and A/V/H.  He stated,"I don't have a clue where I am going from here"

## 2013-04-06 DIAGNOSIS — F319 Bipolar disorder, unspecified: Secondary | ICD-10-CM | POA: Diagnosis not present

## 2013-04-06 DIAGNOSIS — F4310 Post-traumatic stress disorder, unspecified: Secondary | ICD-10-CM | POA: Diagnosis not present

## 2013-04-06 LAB — GLUCOSE, CAPILLARY
GLUCOSE-CAPILLARY: 198 mg/dL — AB (ref 70–99)
Glucose-Capillary: 148 mg/dL — ABNORMAL HIGH (ref 70–99)
Glucose-Capillary: 140 mg/dL — ABNORMAL HIGH (ref 70–99)

## 2013-04-06 MED ORDER — LURASIDONE HCL 40 MG PO TABS
60.0000 mg | ORAL_TABLET | Freq: Every day | ORAL | Status: DC
  Administered 2013-04-07 – 2013-04-08 (×2): 60 mg via ORAL
  Filled 2013-04-06 (×5): qty 2

## 2013-04-06 MED ORDER — LOPERAMIDE HCL 2 MG PO CAPS
4.0000 mg | ORAL_CAPSULE | ORAL | Status: DC | PRN
  Administered 2013-04-06 – 2013-04-07 (×3): 4 mg via ORAL
  Filled 2013-04-06 (×3): qty 2

## 2013-04-06 NOTE — Progress Notes (Signed)
Adult Psychoeducational Group Note  Date:  04/06/2013 Time:  12:47 PM  Group Topic/Focus:  Early Warning Signs:   The focus of this group is to help patients identify signs or symptoms they exhibit before slipping into an unhealthy state or crisis.  Participation Level:  None  Participation Quality:  Resistant  Affect:  Flat  Cognitive:  Lacking  Insight: Limited and None  Engagement in Group:  Poor  Modes of Intervention:  Discussion, Education and Support  Additional Comments:  Pt came to group late, did not contribute and left early.   Clint Bolder 04/06/2013, 12:47 PM

## 2013-04-06 NOTE — Progress Notes (Signed)
[facility] MD Progress Note  04/06/2013 3:50 PM [name]  MRN:  [id number] Subjective:  [name] has experienced some diarrhea. He is not sleeping as well. He understands he is getting used to the new medications. His mood is still unstable. He is glad he is off the Depakote as did not like the way it was making him feel.  Diagnosis:   DSM5: Schizophrenia Disorders:  none Obsessive-Compulsive Disorders:  none Trauma-Stressor Disorders:  Posttraumatic Stress Disorder (309.81) Substance/Addictive Disorders:  In remission for 2 years Depressive Disorders:  Major Depressive Disorder - Moderate (296.22) Total Time spent with patient: 20 minutes  Axis I: Bipolar, Depressed  ADL's:  Intact  Sleep: Poor  Appetite:  Fair  Suicidal Ideation:  Plan:  denies Intent:  denies Means:  denies Homicidal Ideation:  Plan:  denies Intent:  denies Means:  denies AEB (as evidenced by):  Psychiatric Specialty Exam: Physical Exam  Review of Systems  Constitutional: Negative.   HENT: Negative.   Eyes: Negative.   Respiratory: Negative.   Cardiovascular: Negative.   Gastrointestinal: Positive for diarrhea.  Genitourinary: Negative.   Musculoskeletal: Positive for back pain and joint pain.  Skin: Negative.   Neurological: Negative.   Endo/Heme/Allergies: Negative.   Psychiatric/Behavioral: Positive for depression. The patient is nervous/anxious and has insomnia.     Blood pressure 134/87, pulse 90, temperature 98.1 F (36.7 C), temperature source Oral, resp. rate 20, height 5\' 7"  (1.702 m), weight 98.09 kg (216 lb 4 oz).Body mass index is 33.86 kg/(m^2).  General Appearance: Fairly Groomed  Engineer, water::  Fair  Speech:  Clear and Coherent  Volume:  Decreased  Mood:  Anxious, Depressed and worried  Affect:  anxious, worried  Thought Process:  Coherent and Goal Directed  Orientation:  Full (Time, Place, and Person)  Thought Content:  symptoms, worries, concerns  Suicidal Thoughts:  No   Homicidal Thoughts:  No  Memory:  Immediate;   Fair Recent;   Fair Remote;   Fair  Judgement:  Fair  Insight:  Present  Psychomotor Activity:  Restlessness  Concentration:  Fair  Recall:  [place]  Language: Fair  Akathisia:  No  Handed:  N/A  AIMS (if indicated):     Assets:  Desire for Improvement Housing  Sleep:  Number of Hours: 5.25   Musculoskeletal: Strength & Muscle Tone: decreased Gait & Station: unsteady Patient leans: N/A  Current Medications: Current Facility-Administered Medications  Medication Dose Route Frequency Provider Last Rate Last Dose  . acetaminophen (TYLENOL) tablet 650 mg  650 mg Oral Q6H PRN Laverle Hobby, PA-C      . alum & mag hydroxide-simeth (MAALOX/MYLANTA) 200-200-20 MG/5ML suspension 30 mL  30 mL Oral Q4H PRN Laverle Hobby, PA-C   30 mL at 04/04/13 1707  . atorvastatin (LIPITOR) tablet 40 mg  40 mg Oral Daily Laverle Hobby, PA-C   40 mg at 04/06/13 0827  . glipiZIDE (GLUCOTROL) tablet 5 mg  5 mg Oral QAC lunch Laverle Hobby, PA-C   5 mg at 04/06/13 1215  . insulin aspart (novoLOG) injection 0-15 Units  0-15 Units Subcutaneous TID WC Laverle Hobby, PA-C   2 Units at 04/06/13 1212  . lamoTRIgine (LAMICTAL) tablet 25 mg  25 mg Oral Daily Nicholaus Bloom, MD   25 mg at 04/06/13 0827  . lisinopril (PRINIVIL,ZESTRIL) tablet 20 mg  20 mg Oral Daily Laverle Hobby, PA-C   20 mg at 04/06/13 0827  . loperamide (IMODIUM)  capsule 4 mg  4 mg Oral PRN Nicholaus Bloom, MD   4 mg at 04/06/13 1214  . lurasidone (LATUDA) tablet 40 mg  40 mg Oral Q breakfast Encarnacion Slates, NP   40 mg at 04/06/13 0827  . magnesium hydroxide (MILK OF MAGNESIA) suspension 30 mL  30 mL Oral Daily PRN Laverle Hobby, PA-C      . metFORMIN (GLUCOPHAGE) tablet 1,000 mg  1,000 mg Oral BID WC Laverle Hobby, PA-C   1,000 mg at 04/06/13 0827  . metoprolol (LOPRESSOR) tablet 50 mg  50 mg Oral BID Kelvin Cellar, MD   50 mg at 04/06/13 0827  . nicotine (NICODERM  CQ - dosed in mg/24 hours) patch 21 mg  21 mg Transdermal Q0600 Laverle Hobby, PA-C   21 mg at 04/06/13 0600  . traZODone (DESYREL) tablet 100 mg  100 mg Oral QHS PRN Nicholaus Bloom, MD   100 mg at 04/05/13 2129    Lab Results:  Results for orders placed during the hospital encounter of 04/02/13 (from the past 48 hour(s))  GLUCOSE, CAPILLARY     Status: Abnormal   Collection Time    04/04/13  5:01 PM      Result Value Ref Range   Glucose-Capillary 169 (*) 70 - 99 mg/dL  GLUCOSE, CAPILLARY     Status: Abnormal   Collection Time    04/04/13  9:28 PM      Result Value Ref Range   Glucose-Capillary 162 (*) 70 - 99 mg/dL   Comment 1 Notify RN    GLUCOSE, CAPILLARY     Status: Abnormal   Collection Time    04/05/13  6:16 AM      Result Value Ref Range   Glucose-Capillary 154 (*) 70 - 99 mg/dL  GLUCOSE, CAPILLARY     Status: Abnormal   Collection Time    04/05/13 11:39 AM      Result Value Ref Range   Glucose-Capillary 157 (*) 70 - 99 mg/dL  GLUCOSE, CAPILLARY     Status: None   Collection Time    04/05/13  4:50 PM      Result Value Ref Range   Glucose-Capillary 86  70 - 99 mg/dL  GLUCOSE, CAPILLARY     Status: Abnormal   Collection Time    04/06/13  6:13 AM      Result Value Ref Range   Glucose-Capillary 148 (*) 70 - 99 mg/dL  GLUCOSE, CAPILLARY     Status: Abnormal   Collection Time    04/06/13 12:05 PM      Result Value Ref Range   Glucose-Capillary 140 (*) 70 - 99 mg/dL   Comment 1 Notify RN      Physical Findings: AIMS: Facial and Oral Movements Muscles of Facial Expression: None, normal Lips and Perioral Area: None, normal Jaw: None, normal Tongue: None, normal,Extremity Movements Upper (arms, wrists, hands, fingers): None, normal Lower (legs, knees, ankles, toes): None, normal, Trunk Movements Neck, shoulders, hips: None, normal, Overall Severity Severity of abnormal movements (highest score from questions above): None, normal Incapacitation due to abnormal  movements: None, normal Patient's awareness of abnormal movements (rate only patient's report): No Awareness, Dental Status Current problems with teeth and/or dentures?: No Does patient usually wear dentures?: No  CIWA:  CIWA-Ar Total: 1 COWS:  COWS Total Score: 1  Treatment Plan Summary: Daily contact with patient to assess and evaluate symptoms and progress in treatment Medication management  Plan: Supportive approach/coping  skills/relapse prevention           Will increase the Latuda to 60 mg in AM           Imodium 4 mg PRN             Medical Decision Making Problem Points:  New problem, with no additional work-up planned (3) and Review of psycho-social stressors (1) Data Points:  Review of new medications or change in dosage (2)  I certify that inpatient services furnished can reasonably be expected to improve the patient's condition.   Luceal Hollibaugh A 04/06/2013, 3:50 PM

## 2013-04-06 NOTE — Tx Team (Signed)
Interdisciplinary Treatment Plan Update (Adult)  Date: 04/06/2013  Time Reviewed:  9:45 AM  Progress in Treatment: Attending groups: Yes Participating in groups:  Yes Taking medication as prescribed:  Yes Tolerating medication:  Yes Family/Significant othe contact made: No, pt refused Patient understands diagnosis:  Yes Discussing patient identified problems/goals with staff:  Yes Medical problems stabilized or resolved:  Yes Denies suicidal/homicidal ideation: Yes Issues/concerns per patient self-inventory:  Yes Other:  New problem(s) identified: N/A  Discharge Plan or Barriers: Pt has follow up scheduled at [facility] for medication management.    Reason for Continuation of Hospitalization: Anxiety Depression Detox Medication Stabilization  Comments: N/A  Estimated length of stay: 3-4 days, possibly d/c Tuesday   For review of initial/current patient goals, please see plan of care.  Attendees: Patient:     Family:     Physician:  Dr. Sabra Heck 04/06/2013 9:46 AM   Nursing:   Desma Paganini, RN 04/06/2013 9:46 AM   Clinical Social Worker:  Regan Lemming, LCSW 04/06/2013 9:46 AM   Other: Agustina Caroli, NP 04/06/2013 9:46 AM   Other: Eduard Roux, RN 04/06/2013 9:46 AM   Other:  Lars Pinks, case manager 04/06/2013 9:49 AM   Other:     Other:    Other:    Other:    Other:    Other:    Other:     Scribe for Treatment Team:   Ane Payment, 04/06/2013 , 9:46 AM

## 2013-04-06 NOTE — BHH Group Notes (Signed)
[facility] LCSW Aftercare Discharge Planning Group Note   04/06/2013 8:45 AM  Participation Quality:  Alert, Appropriate and Oriented  Mood/Affect:  Flat and Depressed  Depression Rating:  6  Anxiety Rating:  6  Thoughts of Suicide:  Pt denies SI/HI  Will you contract for safety?   Yes  Current AVH:  Pt denies  Plan for Discharge/Comments:  Pt attended discharge planning group and actively participated in group.  CSW provided pt with today's workbook.  Pt states that he will return to his apartment in [place] and follow up with the [facility].  No further needs voiced by pt at this time.    Transportation Means: Pt reports access to transportation - pt will need [employer] contacted for transport back home  Supports: No supports mentioned at this time  Regan Lemming, LCSW 04/06/2013 9:37 AM

## 2013-04-06 NOTE — Progress Notes (Signed)
D.  Pt pleasant on approach, denies complaints other than some continued diarrhea.  Positive for evening AA group, interacting appropriately within milieu with peers.  Denies SI/HI/hallucinations at this time.  A.  Support and encouragement offered, medication given as ordered for diarrhea.  R.  Pt remains safe on unit, will continue to monitor.

## 2013-04-06 NOTE — Progress Notes (Signed)
Recreation Therapy Notes  Date: 02.20.2015 Time: 2:45pm Location: [address] Dayroom   Group Topic: Communication, Team Building, Problem Solving  Goal Area(s) Addresses:  Patient will effectively work with peer towards shared goal.  Patient will identify skill used to make activity successful.  Patient will identify how skills used during activity can be used to reach post d/c goals.   Behavioral Response: Did not attend.   Laureen Ochs Parvin Stetzer, LRT/CTRS  Nioka Thorington L 04/06/2013 4:11 PM

## 2013-04-06 NOTE — BHH Group Notes (Signed)
[place] LCSW Group Therapy  04/06/2013 1:15 PM   Type of Therapy:  Group Therapy  Participation Level:  Did Not Attend - pt sleeping in his room  Regan Lemming, [place] 04/06/2013 2:39 PM

## 2013-04-06 NOTE — Progress Notes (Signed)
D:Pt rates depression as a 6 on 1-10 scale with 10 being the most depressed. Pt ate half on his breakfast and lunch. He c/o of diarrhea and has been resting in his bed. A:Offered support, encouragement and 15 minute. Gave prn medication for diarrhea. R:Pt denies si and hi. Safety maintained on the unit.

## 2013-04-07 DIAGNOSIS — F319 Bipolar disorder, unspecified: Secondary | ICD-10-CM | POA: Diagnosis not present

## 2013-04-07 DIAGNOSIS — F4310 Post-traumatic stress disorder, unspecified: Secondary | ICD-10-CM | POA: Diagnosis not present

## 2013-04-07 LAB — COMPREHENSIVE METABOLIC PANEL
ALK PHOS: 68 U/L (ref 39–117)
ALT: 13 U/L (ref 0–53)
AST: 14 U/L (ref 0–37)
Albumin: 3.7 g/dL (ref 3.5–5.2)
BILIRUBIN TOTAL: 0.4 mg/dL (ref 0.3–1.2)
BUN: 17 mg/dL (ref 6–23)
CHLORIDE: 99 meq/L (ref 96–112)
CO2: 24 meq/L (ref 19–32)
CREATININE: 1.1 mg/dL (ref 0.50–1.35)
Calcium: 9.7 mg/dL (ref 8.4–10.5)
GFR calc Af Amer: 85 mL/min — ABNORMAL LOW (ref >90)
GFR, EST NON AFRICAN AMERICAN: 73 mL/min — AB (ref >90)
Glucose, Bld: 161 mg/dL — ABNORMAL HIGH (ref 70–99)
POTASSIUM: 4.3 meq/L (ref 3.7–5.3)
Sodium: 138 mEq/L (ref 137–147)
Total Protein: 7.2 g/dL (ref 6.0–8.3)

## 2013-04-07 LAB — GLUCOSE, CAPILLARY
GLUCOSE-CAPILLARY: 114 mg/dL — AB (ref 70–99)
GLUCOSE-CAPILLARY: 105 mg/dL — AB (ref 70–99)
Glucose-Capillary: 191 mg/dL — ABNORMAL HIGH (ref 70–99)

## 2013-04-07 MED ORDER — TRAZODONE HCL 150 MG PO TABS
150.0000 mg | ORAL_TABLET | Freq: Every evening | ORAL | Status: DC | PRN
  Administered 2013-04-07 – 2013-04-09 (×3): 150 mg via ORAL
  Filled 2013-04-07: qty 4
  Filled 2013-04-07 (×3): qty 1

## 2013-04-07 NOTE — BHH Group Notes (Signed)
[place] Group Notes: (Clinical Social Work)   04/07/2013      Type of Therapy:  Group Therapy   Participation Level:  Did Not Attend    Selmer Dominion, LCSW 04/07/2013, 12:01 PM

## 2013-04-07 NOTE — BHH Group Notes (Signed)
[place] Group Notes:  (Nursing/MHT/Case Management/Adjunct)  Date:  04/07/2013  Time:  10:48 AM  Type of Therapy:  Nurse Education  Participation Level:  Active  Participation Quality:  Appropriate  Affect:  Appropriate  Cognitive:  Alert  Insight:  Appropriate  Engagement in Group:  Engaged  Modes of Intervention:  Discussion  Summary of Progress/Problems: Pt attended groups on the 500 hall.  Marcello Moores [facility] 04/07/2013, 10:48 AM

## 2013-04-07 NOTE — Progress Notes (Addendum)
Pt is very willing to talk. He states he only slept fair as he has been having diarrhea times three days. MD made aware. He is feeling a 5/10 hopeless and 5/10 depressed. Pt does have a nursing student working with him today because he stated he would like someone to talk to. He ambulates with a cane. Pt denies SI and Hi and contracts for safety. He does have good eye  Contact and states he has chronic right knee pain. 5pm-Pt states he feels much better and has not had anymore diarrhea. Pts FSBS at 5pm was 105 and he did not receive coverage for this.

## 2013-04-07 NOTE — Progress Notes (Signed)
Patient did not attend the evening speaker AA meeting. Pt remained in bed during group time.   

## 2013-04-07 NOTE — Progress Notes (Signed)
[facility] MD Progress Note  04/07/2013 12:47 PM [name]  MRN:  [id number] Subjective:  [name] endorses that he was still having diarrhea early this AM, better later in the afternoon. He is getting adjusted to the [place]. Has tolerated the 60 mg so far. He is still having some difficulties with sleep.   Diagnosis:   DSM5: Schizophrenia Disorders:  none Obsessive-Compulsive Disorders:  none Trauma-Stressor Disorders:  Posttraumatic Stress Disorder (309.81) Substance/Addictive Disorders:  In remission for the last 2 years Depressive Disorders:  Major Depressive Disorder - Severe (296.23) Total Time spent with patient: 30 minutes  Axis I: Generalized Anxiety Disorder  ADL's:  Intact  Sleep: Poor  Appetite:  Fair  Suicidal Ideation:  Plan:  denies Intent:  denies Means:  denies Homicidal Ideation:  Plan:  denies Intent:  denies Means:  denies AEB (as evidenced by):  Psychiatric Specialty Exam: Physical Exam  Review of Systems  Constitutional: Positive for malaise/fatigue.  Eyes: Negative.   Respiratory: Negative.   Cardiovascular: Negative.   Gastrointestinal: Positive for diarrhea.  Genitourinary: Negative.   Musculoskeletal: Positive for back pain and joint pain.  Skin: Negative.   Neurological: Positive for headaches.  Endo/Heme/Allergies: Negative.   Psychiatric/Behavioral: Positive for substance abuse. The patient is nervous/anxious and has insomnia.     Blood pressure 121/75, pulse 69, temperature 98 F (36.7 C), temperature source Oral, resp. rate 16, height 5\' 7"  (1.702 m), weight 98.09 kg (216 lb 4 oz).Body mass index is 33.86 kg/(m^2).  General Appearance: Fairly Groomed  Engineer, water::  Fair  Speech:  Clear and Coherent  Volume:  fluctuates  Mood:  Anxious, Depressed and worried  Affect:  anxious, worried  Thought Process:  Coherent and Goal Directed  Orientation:  Full (Time, Place, and Person)  Thought Content:  symtpoms, worries, concerns   Suicidal Thoughts:  No  Homicidal Thoughts:  No  Memory:  Immediate;   Fair Recent;   Fair Remote;   Fair  Judgement:  Fair  Insight:  Fair  Psychomotor Activity:  Restlessness  Concentration:  Fair  Recall:  [employer] of Knowledge:NA  Language: Fair  Akathisia:  No  Handed:    AIMS (if indicated):     Assets:  Desire for Improvement Housing  Sleep:  Number of Hours: 6.25   Musculoskeletal: Strength & Muscle Tone: decrease Gait & Station: unsteady Patient leans: N/A  Current Medications: Current Facility-Administered Medications  Medication Dose Route Frequency Provider Last Rate Last Dose  . acetaminophen (TYLENOL) tablet 650 mg  650 mg Oral Q6H PRN Laverle Hobby, PA-C      . alum & mag hydroxide-simeth (MAALOX/MYLANTA) 200-200-20 MG/5ML suspension 30 mL  30 mL Oral Q4H PRN Laverle Hobby, PA-C   30 mL at 04/04/13 1707  . atorvastatin (LIPITOR) tablet 40 mg  40 mg Oral Daily Laverle Hobby, PA-C   40 mg at 04/07/13 9735  . glipiZIDE (GLUCOTROL) tablet 5 mg  5 mg Oral QAC lunch Laverle Hobby, PA-C   5 mg at 04/07/13 1158  . insulin aspart (novoLOG) injection 0-15 Units  0-15 Units Subcutaneous TID WC Laverle Hobby, PA-C   3 Units at 04/07/13 3299  . lamoTRIgine (LAMICTAL) tablet 25 mg  25 mg Oral Daily Nicholaus Bloom, MD   25 mg at 04/07/13 [id number]  . lisinopril (PRINIVIL,ZESTRIL) tablet 20 mg  20 mg Oral Daily Laverle Hobby, PA-C   20 mg at 04/07/13 8341  . loperamide (IMODIUM) capsule 4 mg  4 mg Oral PRN Nicholaus Bloom, MD   4 mg at 04/07/13 0818  . lurasidone (LATUDA) tablet 60 mg  60 mg Oral Q breakfast Nicholaus Bloom, MD   60 mg at 04/07/13 8921  . magnesium hydroxide (MILK OF MAGNESIA) suspension 30 mL  30 mL Oral Daily PRN Laverle Hobby, PA-C      . metFORMIN (GLUCOPHAGE) tablet 1,000 mg  1,000 mg Oral BID WC Laverle Hobby, PA-C   1,000 mg at 04/07/13 0813  . metoprolol (LOPRESSOR) tablet 50 mg  50 mg Oral BID Kelvin Cellar, MD   50 mg at 04/07/13 0814  .  nicotine (NICODERM CQ - dosed in mg/24 hours) patch 21 mg  21 mg Transdermal Q0600 Laverle Hobby, PA-C   21 mg at 04/07/13 0606  . traZODone (DESYREL) tablet 100 mg  100 mg Oral QHS PRN Nicholaus Bloom, MD   100 mg at 04/06/13 2131    Lab Results:  Results for orders placed during the hospital encounter of 04/02/13 (from the past 48 hour(s))  GLUCOSE, CAPILLARY     Status: None   Collection Time    04/05/13  4:50 PM      Result Value Ref Range   Glucose-Capillary 86  70 - 99 mg/dL  GLUCOSE, CAPILLARY     Status: Abnormal   Collection Time    04/06/13  6:13 AM      Result Value Ref Range   Glucose-Capillary 148 (*) 70 - 99 mg/dL  GLUCOSE, CAPILLARY     Status: Abnormal   Collection Time    04/06/13 12:05 PM      Result Value Ref Range   Glucose-Capillary 140 (*) 70 - 99 mg/dL   Comment 1 Notify RN    GLUCOSE, CAPILLARY     Status: Abnormal   Collection Time    04/06/13  4:39 PM      Result Value Ref Range   Glucose-Capillary 198 (*) 70 - 99 mg/dL   Comment 1 Notify RN    GLUCOSE, CAPILLARY     Status: Abnormal   Collection Time    04/07/13  6:04 AM      Result Value Ref Range   Glucose-Capillary 191 (*) 70 - 99 mg/dL    Physical Findings: AIMS: Facial and Oral Movements Muscles of Facial Expression: None, normal Lips and Perioral Area: None, normal Jaw: None, normal Tongue: None, normal,Extremity Movements Upper (arms, wrists, hands, fingers): None, normal Lower (legs, knees, ankles, toes): None, normal, Trunk Movements Neck, shoulders, hips: None, normal, Overall Severity Severity of abnormal movements (highest score from questions above): None, normal Incapacitation due to abnormal movements: None, normal Patient's awareness of abnormal movements (rate only patient's report): No Awareness, Dental Status Current problems with teeth and/or dentures?: No Does patient usually wear dentures?: No  CIWA:  CIWA-Ar Total: 1 COWS:  COWS Total Score: 1  Treatment Plan  Summary: Daily contact with patient to assess and evaluate symptoms and progress in treatment Medication management  Plan: Supportive approach/coping skills/relapse prevention           Continue the Lamictal/Latuda combination           Increase the Trazodone           CMET  Medical Decision Making Problem Points:  Review of psycho-social stressors (1) Data Points:  Review of medication regiment & side effects (2)  I certify that inpatient services furnished can reasonably be expected to improve the patient's condition.  [name] A 04/07/2013, 12:47 PM

## 2013-04-07 NOTE — BHH Group Notes (Signed)
[place] Group Notes:  (Nursing/MHT/Case Management/Adjunct)  Date:  04/07/2013  Time:  2:19 PM  Type of Therapy:  Psychoeducational Skills  Participation Level:  Did Not Attend  Participation Quality:  Inattentive  Affect:  Depressed  Cognitive:  Lacking  Insight:  None  Engagement in Group:  None  Modes of Intervention:  Discussion  Summary of Progress/Problems:pt did not attend group  [name] [facility] 04/07/2013, 2:19 PM

## 2013-04-08 DIAGNOSIS — F4310 Post-traumatic stress disorder, unspecified: Secondary | ICD-10-CM | POA: Diagnosis not present

## 2013-04-08 DIAGNOSIS — F319 Bipolar disorder, unspecified: Secondary | ICD-10-CM | POA: Diagnosis not present

## 2013-04-08 LAB — GLUCOSE, CAPILLARY
Glucose-Capillary: 114 mg/dL — ABNORMAL HIGH (ref 70–99)
Glucose-Capillary: 136 mg/dL — ABNORMAL HIGH (ref 70–99)
Glucose-Capillary: 104 mg/dL — ABNORMAL HIGH (ref 70–99)

## 2013-04-08 MED ORDER — PANTOPRAZOLE SODIUM 40 MG PO TBEC
40.0000 mg | DELAYED_RELEASE_TABLET | Freq: Every day | ORAL | Status: DC
  Administered 2013-04-08 – 2013-04-10 (×3): 40 mg via ORAL
  Filled 2013-04-08 (×6): qty 1

## 2013-04-08 NOTE — Progress Notes (Signed)
Patient ID: [name], male   DOB: April 29, 1955, 58 y.o.   MRN: [id number] D)   Has been pleasant and cooperative this evening, but felt too tired to go to group, stayed in bed during group.   Did come out briefly for a snack and came to the med window for hs meds.   Stated has chronic rt knee pain, walks with a cane, refused anything for pain, went to bed shortly after trazodone.   A)  Will continue to monitor for safety, continue POC, encourage to attend and participate in groups, milieu R)  Safety maintained.

## 2013-04-08 NOTE — BHH Group Notes (Signed)
[place] Group Notes:  (Nursing/MHT/Case Management/Adjunct)  Date:  04/08/2013  Time:  11:14 AM  Type of Therapy:  Nurse Education  Participation Level:  Active  Participation Quality:  Appropriate  Affect:  Appropriate  Cognitive:  Alert  Insight:  Appropriate  Engagement in Group:  Engaged  Modes of Intervention:  Discussion  Summary of Progress/Problems: Pt stated he will try to reconnect with family and does have a close friend who is a neighbor he can always call. Marcello Moores [facility] 04/08/2013, 11:14 AM

## 2013-04-08 NOTE — Progress Notes (Signed)
Patient did attend the evening speaker AA meeting.  

## 2013-04-08 NOTE — Clinical Social Work Note (Signed)
At patient's request, CSW provided clothing to patient from the clothing closet, including shirt and jeans.  [name] 04/08/2013 1:23 PM

## 2013-04-08 NOTE — Progress Notes (Signed)
Adult Psychoeducational Group Note  Date:  04/08/2013 Time:  3:15PM  Group Topic/Focus:  Healthy Support Systems  Participation Level:  Active  Participation Quality:  Appropriate and Attentive  Affect:  Appropriate  Cognitive:  Appropriate  Insight: Appropriate  Engagement in Group:  Engaged  Modes of Intervention:  Discussion  Additional Comments:  Pt indicated that his friends in his meetings are his biggest supporters. Pt expressed that he could become a better support to himself by being honest with himself.   Zoila Shutter R 04/08/2013, 6:04 PM

## 2013-04-08 NOTE — Progress Notes (Addendum)
Pt appears in good spirits this am. He stated he would like to reach out to his family that he has been estranged from and talk to them more regularly. He stated his depression and hopelessness are a 6/10. Pt states he does have a neighbor up the street that he can talk to that is a recovery addict of 30 years. He denies SI and HI and contracts for safety.pt has been attending all the groups. 3pm pt given 30cc MOM for indigestion and some diet gingerale.

## 2013-04-08 NOTE — BHH Group Notes (Signed)
[place] Group Notes:  (Nursing/MHT/Case Management/Adjunct)  Date:  04/08/2013  Time:  1:40 PM  Type of Therapy:  Nurse Education  Participation Level:  Active  Participation Quality:  Appropriate  Affect:  Appropriate  Cognitive:  Alert  Insight:  Appropriate  Engagement in Group:  Engaged  Modes of Intervention:  Discussion  Summary of Progress/Problems:Pt talked about his healthy support system and how he often has conflict with his wife and wanted suggestions on how to deal with it.   Marcello Moores [facility] 04/08/2013, 1:40 PM

## 2013-04-08 NOTE — Progress Notes (Signed)
[facility] MD Progress Note  04/08/2013 5:02 PM [name]  MRN:  [id number] Subjective:  The diarrhea is better. He is still not sleeping as well but would rather not take more medications. Feels he is tolerating the medications well so far. Wants to continue this regime. Diagnosis:   DSM5: Schizophrenia Disorders:  none Obsessive-Compulsive Disorders:  none Trauma-Stressor Disorders:  Posttraumatic Stress Disorder (309.81) Substance/Addictive Disorders:  In remission Depressive Disorders:  Major Depressive Disorder - Moderate (296.22) Total Time spent with patient: 30 minutes  Axis I: Generalized Anxiety Disorder  ADL's:  Intact  Sleep: Fair  Appetite:  Fair  Suicidal Ideation:  Plan:  denies Intent:  denies Means:  denies Homicidal Ideation:  Plan:  denies Intent:  denies Means:  denies AEB (as evidenced by):  Psychiatric Specialty Exam: Physical Exam  Review of Systems  Constitutional: Positive for malaise/fatigue.  HENT: Negative.   Eyes: Negative.   Respiratory: Negative.   Cardiovascular: Negative.   Gastrointestinal: Negative.   Genitourinary: Negative.   Musculoskeletal: Positive for back pain and joint pain.  Skin: Negative.   Neurological: Positive for weakness.  Endo/Heme/Allergies: Negative.   Psychiatric/Behavioral: Positive for depression. The patient is nervous/anxious and has insomnia.     Blood pressure 137/91, pulse 77, temperature 97.7 F (36.5 C), temperature source Oral, resp. rate 19, height $RemoveBe'5\' 7"'bRXAHLFwf$  (1.702 m), weight 98.09 kg (216 lb 4 oz).Body mass index is 33.86 kg/(m^2).  General Appearance: Fairly Groomed  Engineer, water::  Fair  Speech:  Clear and Coherent  Volume:  fluctuates  Mood:  Anxious and worried  Affect:  anxious, worried  Thought Process:  Coherent and Goal Directed  Orientation:  Full (Time, Place, and Person)  Thought Content:  symptoms, worries, concerns  Suicidal Thoughts:  No  Homicidal Thoughts:  No  Memory:  Immediate;    Fair Recent;   Fair Remote;   Fair  Judgement:  Fair  Insight:  Present  Psychomotor Activity:  Restlessness  Concentration:  Fair  Recall:  [employer] of [place]: Fair  Akathisia:  No  Handed:    AIMS (if indicated):     Assets:  Desire for Improvement Housing  Sleep:  Number of Hours: 6.5   Musculoskeletal: Strength & Muscle Tone: Decreased Gait & Station: unsteady Patient leans: N/A  Current Medications: Current Facility-Administered Medications  Medication Dose Route Frequency Provider Last Rate Last Dose  . acetaminophen (TYLENOL) tablet 650 mg  650 mg Oral Q6H PRN Laverle Hobby, PA-C      . alum & mag hydroxide-simeth (MAALOX/MYLANTA) 200-200-20 MG/5ML suspension 30 mL  30 mL Oral Q4H PRN Laverle Hobby, PA-C   30 mL at 04/08/13 1511  . atorvastatin (LIPITOR) tablet 40 mg  40 mg Oral Daily Laverle Hobby, PA-C   40 mg at 04/08/13 9628  . glipiZIDE (GLUCOTROL) tablet 5 mg  5 mg Oral QAC lunch Laverle Hobby, PA-C   5 mg at 04/08/13 1150  . insulin aspart (novoLOG) injection 0-15 Units  0-15 Units Subcutaneous TID WC Laverle Hobby, PA-C   2 Units at 04/08/13 1150  . lamoTRIgine (LAMICTAL) tablet 25 mg  25 mg Oral Daily Nicholaus Bloom, MD   25 mg at 04/08/13 3662  . lisinopril (PRINIVIL,ZESTRIL) tablet 20 mg  20 mg Oral Daily Laverle Hobby, PA-C   20 mg at 04/08/13 9476  . loperamide (IMODIUM) capsule 4 mg  4 mg Oral PRN Nicholaus Bloom, MD   4 mg  at 04/07/13 0818  . lurasidone (LATUDA) tablet 60 mg  60 mg Oral Q breakfast Nicholaus Bloom, MD   60 mg at 04/08/13 3154  . magnesium hydroxide (MILK OF MAGNESIA) suspension 30 mL  30 mL Oral Daily PRN Laverle Hobby, PA-C      . metFORMIN (GLUCOPHAGE) tablet 1,000 mg  1,000 mg Oral BID WC Laverle Hobby, PA-C   1,000 mg at 04/08/13 0836  . metoprolol (LOPRESSOR) tablet 50 mg  50 mg Oral BID Kelvin Cellar, MD   50 mg at 04/08/13 0836  . nicotine (NICODERM CQ - dosed in mg/24 hours) patch 21 mg  21 mg  Transdermal Q0600 Laverle Hobby, PA-C   21 mg at 04/08/13 0636  . traZODone (DESYREL) tablet 150 mg  150 mg Oral QHS PRN Nicholaus Bloom, MD   150 mg at 04/07/13 2209    Lab Results:  Results for orders placed during the hospital encounter of 04/02/13 (from the past 48 hour(s))  GLUCOSE, CAPILLARY     Status: Abnormal   Collection Time    04/07/13  6:04 AM      Result Value Ref Range   Glucose-Capillary 191 (*) 70 - 99 mg/dL  GLUCOSE, CAPILLARY     Status: Abnormal   Collection Time    04/07/13 11:56 AM      Result Value Ref Range   Glucose-Capillary 114 (*) 70 - 99 mg/dL  GLUCOSE, CAPILLARY     Status: Abnormal   Collection Time    04/07/13  4:52 PM      Result Value Ref Range   Glucose-Capillary 105 (*) 70 - 99 mg/dL  COMPREHENSIVE METABOLIC PANEL     Status: Abnormal   Collection Time    04/07/13  7:45 PM      Result Value Ref Range   Sodium 138  137 - 147 mEq/L   Potassium 4.3  3.7 - 5.3 mEq/L   Chloride 99  96 - 112 mEq/L   CO2 24  19 - 32 mEq/L   Glucose, Bld 161 (*) 70 - 99 mg/dL   BUN 17  6 - 23 mg/dL   Creatinine, Ser 1.10  0.50 - 1.35 mg/dL   Calcium 9.7  8.4 - 10.5 mg/dL   Total Protein 7.2  6.0 - 8.3 g/dL   Albumin 3.7  3.5 - 5.2 g/dL   AST 14  0 - 37 U/L   ALT 13  0 - 53 U/L   Alkaline Phosphatase 68  39 - 117 U/L   Total Bilirubin 0.4  0.3 - 1.2 mg/dL   GFR calc non Af Amer 73 (*) >90 mL/min   GFR calc Af Amer 85 (*) >90 mL/min   Comment: (NOTE)     The eGFR has been calculated using the CKD EPI equation.     This calculation has not been validated in all clinical situations.     eGFR's persistently <90 mL/min signify possible Chronic Kidney     Disease.     Performed at [facility]  GLUCOSE, CAPILLARY     Status: Abnormal   Collection Time    04/08/13  6:28 AM      Result Value Ref Range   Glucose-Capillary 114 (*) 70 - 99 mg/dL   Comment 1 Notify RN    GLUCOSE, CAPILLARY     Status: Abnormal   Collection Time    04/08/13 11:37  AM      Result Value Ref  Range   Glucose-Capillary 136 (*) 70 - 99 mg/dL    Physical Findings: AIMS: Facial and Oral Movements Muscles of Facial Expression: None, normal Lips and Perioral Area: None, normal Jaw: None, normal Tongue: None, normal,Extremity Movements Upper (arms, wrists, hands, fingers): None, normal Lower (legs, knees, ankles, toes): None, normal, Trunk Movements Neck, shoulders, hips: None, normal, Overall Severity Severity of abnormal movements (highest score from questions above): None, normal Incapacitation due to abnormal movements: None, normal Patient's awareness of abnormal movements (rate only patient's report): No Awareness, Dental Status Current problems with teeth and/or dentures?: No Does patient usually wear dentures?: No  CIWA:  CIWA-Ar Total: 1 COWS:  COWS Total Score: 1  Treatment Plan Summary: Daily contact with patient to assess and evaluate symptoms and progress in treatment Medication management  Plan: Supportive approach/coping skills/relapse prevention           Continue Latuda at 60 mg  Medical Decision Making Problem Points:  Review of psycho-social stressors (1) Data Points:  Review of medication regiment & side effects (2) Review of new medications or change in dosage (2)  I certify that inpatient services furnished can reasonably be expected to improve the patient's condition.   Alura Olveda A 04/08/2013, 5:02 PM

## 2013-04-08 NOTE — BHH Group Notes (Signed)
[place] Group Notes:  (Clinical Social Work)  04/08/2013  10:00-11:00AM  Summary of Progress/Problems:   The main focus of today's process group was to   identify the patient's current support system and decide on other supports that can be put in place.  The picture on workbook was used to discuss why additional supports are needed.  An emphasis was placed on using counselor, doctor, therapy groups, 12-step groups, and problem-specific support groups to expand supports.   There was also an extensive discussion about what constitutes a healthy support versus an unhealthy support.  The patient expressed full comprehension of the concepts presented, and agreed that there is a need to add more supports.  The patient stated the current supports in place are the [facility].  The unhealthy support is himself and his own mind telling him he does not need to go to meetings.  The supports that are to be added are any plan that would ensure he would go to the meeting, such as meeting someone there, getting papers signed that he is there, and/or planning his day/other activities around getting to that side of town.  Type of Therapy:  Process Group with Motivational Interviewing  Participation Level:  Active  Participation Quality:  Attentive and Sharing  Affect:  Appropriate  Cognitive:  Appropriate  Insight:  Developing/Improving  Engagement in Therapy:  Engaged  Modes of Intervention:   Education, Support and Processing, Activity  [employer], LCSW 04/08/2013, 12:15pm

## 2013-04-09 DIAGNOSIS — F411 Generalized anxiety disorder: Secondary | ICD-10-CM | POA: Diagnosis not present

## 2013-04-09 DIAGNOSIS — F319 Bipolar disorder, unspecified: Secondary | ICD-10-CM | POA: Diagnosis not present

## 2013-04-09 DIAGNOSIS — F4310 Post-traumatic stress disorder, unspecified: Secondary | ICD-10-CM | POA: Diagnosis not present

## 2013-04-09 LAB — GLUCOSE, CAPILLARY
GLUCOSE-CAPILLARY: 161 mg/dL — AB (ref 70–99)
GLUCOSE-CAPILLARY: 127 mg/dL — AB (ref 70–99)
Glucose-Capillary: 96 mg/dL (ref 70–99)
Glucose-Capillary: 159 mg/dL — ABNORMAL HIGH (ref 70–99)

## 2013-04-09 NOTE — BHH Group Notes (Signed)
[place] LCSW Group Therapy  04/09/2013 3:18 PM  Type of Therapy:  Group Therapy  Participation Level:  Active  Participation Quality:  Attentive  Affect:  Appropriate  Cognitive:  Alert and Oriented  Insight:  Improving  Engagement in Therapy:  Engaged  Modes of Intervention:  Confrontation, Discussion, Education, Exploration, Problem-solving, Rapport Building, Socialization and Support  Summary of Progress/Problems: Today's Topic: Overcoming Obstacles. Pt identified obstacles faced currently and processed barriers involved in overcoming these obstacles. Pt identified steps necessary for overcoming these obstacles and explored motivation (internal and external) for facing these difficulties head on. Pt further identified one area of concern in their lives and chose a skill of focus pulled from their "toolbox." [name] was attentive and engaged throughout today's group. He shared that his biggest obstacle involves his family stressors. [name] shared that his family (primarily his mother) does not understand mental illness and is often judgemental and does not sympathize with him. 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    [place], [name] LCSWA  04/09/2013, 3:18 PM

## 2013-04-09 NOTE — Plan of Care (Signed)
Problem: Food- and Nutrition-Related Knowledge Deficit (NB-1.1) Goal: Nutrition education Formal process to instruct or train a patient/client in a skill or to impart knowledge to help patients/clients voluntarily manage or modify food choices and eating behavior to maintain or improve health.  Outcome: Completed/Met Date Met:  04/09/13  RD consulted for nutrition education regarding diabetes.     Lab Results  Component Value Date    HGBA1C 8.4* 04/03/2013    RD provided "Carbohydrate Counting for People with Diabetes" handout from the Academy of Nutrition and Dietetics. Discussed different food groups and their effects on blood sugar, emphasizing carbohydrate-containing foods. Provided list of carbohydrates and recommended serving sizes of common foods.  Discussed importance of controlled and consistent carbohydrate intake throughout the day. Provided examples of ways to balance meals/snacks and encouraged intake of high-fiber, whole grain complex carbohydrates. Teach back method used.  Expect good compliance.  Body mass index is 33.86 kg/(m^2). Pt meets criteria for obesity grade 1 based on current BMI.  Current diet order is CHO MOD, patient is consuming approximately 100% of meals at this time. Labs and medications reviewed. No further nutrition interventions warranted at this time. RD contact information provided. If additional nutrition issues arise, please re-consult RD.  Antonieta Iba, RD, LDN Clinical Inpatient Dietitian Pager:  [phone] Weekend and after hours pager:  [phone]

## 2013-04-09 NOTE — Progress Notes (Signed)
D Pt. Denies SI and  HI, no complaints of pain or discomfort noted.  A Writer offered support and encouragement.  Discussed discharge plans with pt.  R Pt. Remains safe on the unit.  Reports he  Will turn negatives into a positive and have a more positive outlook on life, states he will talk to people instead of keeping things to self, also states that he knows he needs to talk to his Mother and his family but feels that they expected him to be perfect, he is going to attempt to build a relationship with them and start new.  Pt. Has been sober for 2 years.

## 2013-04-09 NOTE — Progress Notes (Signed)
Patient ID: [name], male   DOB: 1955-04-24, 58 y.o.   MRN: [id number] D)  Resting quietly tonight, eyes closed, resp reg, unlabored, no c/o's voiced.   A)  Will continue to monitor for safety, continue POC R)  Safety maintained

## 2013-04-09 NOTE — Clinical Social Work Note (Signed)
CSW contacted [facility] 4x today in attempt to set pt up with services [phone]). No answer/unable to leave voicemail. Pt scheduled for d/c Tuesday 2/24. CSW to continue attempting to make contact with [facility], set pt up for services, and schedule transport home tomorrow.   [place], LCSWA 04/09/2013 12:01 PM

## 2013-04-09 NOTE — Progress Notes (Signed)
D: Patient denies SI/HI and auditory and visual hallucinations. Patient has an anxious mood and affect. The patient exhibits no detox symptoms at this time. The patient is attending groups and interacting appropriately within the milieu.  A: Patient given emotional support from RN. Patient encouraged to come to staff with concerns and/or questions. Patient's medication routine continued. Patient's orders and plan of care reviewed.  R: Patient remains appropriate and cooperative. Will continue to monitor patient q15 minutes for safety.

## 2013-04-09 NOTE — Progress Notes (Signed)
Adult Psychoeducational Group Note  Date:  04/09/2013 Time:  10:00 pm  Group Topic/Focus:  Wellness Toolbox:   The focus of this group is to discuss various aspects of wellness, balancing those aspects and exploring ways to increase the ability to experience wellness.  Patients will create a wellness toolbox for use upon discharge.  Participation Level:  Active  Participation Quality:  Appropriate and Sharing  Affect:  Appropriate  Cognitive:  Appropriate  Insight: Appropriate  Engagement in Group:  Engaged  Modes of Intervention:  Discussion, Education, Socialization and Support  Additional Comments:  Pt stated that by taking his medication and exercising that he can improve his overall health and wellness. Pt stated that procrastination and letting other people influence him to do negative things are barriers to his progress.   Wynetta Emery, Jenalyn Girdner 04/09/2013, 4:27 PM

## 2013-04-09 NOTE — Progress Notes (Signed)
[facility] MD Progress Note  04/09/2013 5:09 PM [name]  MRN:  [id number] Subjective:  Off the Latuda states that his stomach is much better. He would rather just stay on the Lamictal. States he has not have any side effects from it. Once he gets home he states he plans to stay away from negative people. States that his family is still judging him based on his past behavior. He feels this is not fair. 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   Eyes: Negative.   Respiratory: Negative.   Cardiovascular: Negative.   Gastrointestinal: Negative.   Genitourinary: Negative.   Musculoskeletal: Negative.   Skin: Negative.   Neurological: Negative.   Endo/Heme/Allergies: Negative.   Psychiatric/Behavioral: Positive for depression. The patient is nervous/anxious.     Blood pressure 126/89, pulse 105, temperature 97.7 F (36.5 C), temperature source Oral, resp. rate 19, height $RemoveBe'5\' 7"'DueQhBeEw$  (1.702 m), weight 98.09 kg (216 lb 4 oz).Body mass index is 33.86 kg/(m^2).  General Appearance: Fairly Groomed  Engineer, water::  Fair  Speech:  Clear and Coherent  Volume:  Normal  Mood:  worried  Affect:  anxious  Thought Process:  Coherent and Goal Directed  Orientation:  Full (Time, Place, and Person)  Thought Content:  symptoms, worries, concerns  Suicidal  Thoughts:  No  Homicidal Thoughts:  No  Memory:  Immediate;   Fair Recent;   Fair Remote;   Fair  Judgement:  Fair  Insight:  Present  Psychomotor Activity:  Normal  Concentration:  Fair  Recall:  [employer] [place]  Language: Fair  Akathisia:  No  Handed:    AIMS (if indicated):     Assets:  Desire for Improvement  Sleep:  Number of Hours: 5   Musculoskeletal: Strength & Muscle Tone: within normal limits Gait & Station: normal Patient leans: N/A  Current Medications: Current Facility-Administered Medications  Medication Dose Route Frequency Provider Last Rate Last Dose  . acetaminophen (TYLENOL) tablet 650 mg  650 mg Oral Q6H PRN Laverle Hobby, PA-C      . alum & mag hydroxide-simeth (MAALOX/MYLANTA) 200-200-20 MG/5ML suspension 30 mL  30 mL Oral Q4H PRN Laverle Hobby, PA-C   30 mL at 04/08/13 1511  . atorvastatin (LIPITOR) tablet 40 mg  40 mg Oral Daily Laverle Hobby, PA-C   40 mg at 04/09/13 0753  . glipiZIDE (GLUCOTROL) tablet 5 mg  5 mg Oral QAC lunch Laverle Hobby, PA-C   5 mg at 04/09/13 1221  . insulin aspart (novoLOG) injection 0-15 Units  0-15 Units Subcutaneous TID WC Laverle Hobby, PA-C   3 Units at 04/09/13 1220  . lamoTRIgine (LAMICTAL) tablet 25 mg  25 mg Oral Daily Nicholaus Bloom, MD   25 mg at 04/09/13 0753  . lisinopril (PRINIVIL,ZESTRIL) tablet 20 mg  20 mg Oral Daily Frederico Hamman E  Simon, PA-C   20 mg at 04/09/13 0753  . loperamide (IMODIUM) capsule 4 mg  4 mg Oral PRN Rachael Fee, MD   4 mg at 04/07/13 0818  . lurasidone (LATUDA) tablet 60 mg  60 mg Oral Q breakfast Rachael Fee, MD   60 mg at 04/08/13 9638  . magnesium hydroxide (MILK OF MAGNESIA) suspension 30 mL  30 mL Oral Daily PRN Kerry Hough, PA-C      . metFORMIN (GLUCOPHAGE) tablet 1,000 mg  1,000 mg Oral BID WC Kerry Hough, PA-C   1,000 mg at 04/09/13 0753  . metoprolol (LOPRESSOR) tablet 50 mg  50 mg Oral BID Jeralyn Bennett, MD   50 mg at 04/09/13 0753  . nicotine  (NICODERM CQ - dosed in mg/24 hours) patch 21 mg  21 mg Transdermal Q0600 Kerry Hough, PA-C   21 mg at 04/09/13 1203  . pantoprazole (PROTONIX) EC tablet 40 mg  40 mg Oral Daily Rachael Fee, MD   40 mg at 04/09/13 0753  . traZODone (DESYREL) tablet 150 mg  150 mg Oral QHS PRN Rachael Fee, MD   150 mg at 04/08/13 2204    Lab Results:  Results for orders placed during the hospital encounter of 04/02/13 (from the past 48 hour(s))  COMPREHENSIVE METABOLIC PANEL     Status: Abnormal   Collection Time    04/07/13  7:45 PM      Result Value Ref Range   Sodium 138  137 - 147 mEq/L   Potassium 4.3  3.7 - 5.3 mEq/L   Chloride 99  96 - 112 mEq/L   CO2 24  19 - 32 mEq/L   Glucose, Bld 161 (*) 70 - 99 mg/dL   BUN 17  6 - 23 mg/dL   Creatinine, Ser 2.25  0.50 - 1.35 mg/dL   Calcium 9.7  8.4 - 20.1 mg/dL   Total Protein 7.2  6.0 - 8.3 g/dL   Albumin 3.7  3.5 - 5.2 g/dL   AST 14  0 - 37 U/L   ALT 13  0 - 53 U/L   Alkaline Phosphatase 68  39 - 117 U/L   Total Bilirubin 0.4  0.3 - 1.2 mg/dL   GFR calc non Af Amer 73 (*) >90 mL/min   GFR calc Af Amer 85 (*) >90 mL/min   Comment: (NOTE)     The eGFR has been calculated using the CKD EPI equation.     This calculation has not been validated in all clinical situations.     eGFR's persistently <90 mL/min signify possible Chronic Kidney     Disease.     Performed at [name]  GLUCOSE, CAPILLARY     Status: Abnormal   Collection Time    04/08/13  6:28 AM      Result Value Ref Range   Glucose-Capillary 114 (*) 70 - 99 mg/dL   Comment 1 Notify RN    GLUCOSE, CAPILLARY     Status: Abnormal   Collection Time    04/08/13 11:37 AM      Result Value Ref Range   Glucose-Capillary 136 (*) 70 - 99 mg/dL  GLUCOSE, CAPILLARY     Status: Abnormal   Collection Time    04/08/13  5:29 PM      Result Value Ref Range   Glucose-Capillary 104 (*) 70 - 99 mg/dL  GLUCOSE, CAPILLARY     Status: None   Collection Time  04/09/13   5:57 AM      Result Value Ref Range   Glucose-Capillary 96  70 - 99 mg/dL   Comment 1 Notify RN     Comment 2 Documented in Chart    GLUCOSE, CAPILLARY     Status: Abnormal   Collection Time    04/09/13 12:19 PM      Result Value Ref Range   Glucose-Capillary 161 (*) 70 - 99 mg/dL  GLUCOSE, CAPILLARY     Status: Abnormal   Collection Time    04/09/13  4:50 PM      Result Value Ref Range   Glucose-Capillary 159 (*) 70 - 99 mg/dL    Physical Findings: AIMS: Facial and Oral Movements Muscles of Facial Expression: None, normal Lips and Perioral Area: None, normal Jaw: None, normal Tongue: None, normal,Extremity Movements Upper (arms, wrists, hands, fingers): None, normal Lower (legs, knees, ankles, toes): None, normal, Trunk Movements Neck, shoulders, hips: None, normal, Overall Severity Severity of abnormal movements (highest score from questions above): None, normal Incapacitation due to abnormal movements: None, normal Patient's awareness of abnormal movements (rate only patient's report): No Awareness, Dental Status Current problems with teeth and/or dentures?: No Does patient usually wear dentures?: No  CIWA:  CIWA-Ar Total: 1 COWS:  COWS Total Score: 1  Treatment Plan Summary: Daily contact with patient to assess and evaluate symptoms and progress in treatment Medication management  Plan: Supportive approach/coping skills/relapse prevention           D/C Latuda  Medical Decision Making Problem Points:  Review of psycho-social stressors (1) Data Points:  Review of medication regiment & side effects (2) Review of new medications or change in dosage (2)  I certify that inpatient services furnished can reasonably be expected to improve the patient's condition.   Dandra Shambaugh A 04/09/2013, 5:09 PM

## 2013-04-09 NOTE — BHH Group Notes (Signed)
[facility] LCSW Aftercare Discharge Planning Group Note   04/09/2013 9:46 AM  Participation Quality:  Appropriate   Mood/Affect:  Appropriate  Depression Rating:  3  Anxiety Rating:  3  Thoughts of Suicide:  No Will you contract for safety?   NA  Current AVH:  No  Plan for Discharge/Comments:  Pt reports that he is nausious from his meds but feels better. Pt reports sleeping well. He will follow up with [facility] for med management and [facility] for groups. No referral for therapist per pt request.   Transportation Means: [employer] or taxi.   Supports: some family supports.   Smart, [employer]

## 2013-04-10 DIAGNOSIS — F319 Bipolar disorder, unspecified: Secondary | ICD-10-CM | POA: Diagnosis not present

## 2013-04-10 DIAGNOSIS — F4310 Post-traumatic stress disorder, unspecified: Secondary | ICD-10-CM | POA: Diagnosis not present

## 2013-04-10 LAB — GLUCOSE, CAPILLARY: Glucose-Capillary: 127 mg/dL — ABNORMAL HIGH (ref 70–99)

## 2013-04-10 MED ORDER — LAMOTRIGINE 25 MG PO TABS: 25.0000 mg | ORAL_TABLET | Freq: Every day | ORAL | Status: DC

## 2013-04-10 MED ORDER — INSULIN ASPART 100 UNIT/ML ~~LOC~~ SOLN: 0.0000 [IU] | Freq: Three times a day (TID) | SUBCUTANEOUS | Status: DC

## 2013-04-10 MED ORDER — METOPROLOL TARTRATE 50 MG PO TABS: 50.0000 mg | ORAL_TABLET | Freq: Two times a day (BID) | ORAL | Status: DC

## 2013-04-10 MED ORDER — TRAZODONE HCL 150 MG PO TABS: 150.0000 mg | ORAL_TABLET | Freq: Every evening | ORAL | Status: DC | PRN

## 2013-04-10 NOTE — BHH Suicide Risk Assessment (Signed)
Suicide Risk Assessment  Discharge Assessment     Demographic Factors:  Male  Total Time spent with patient: 45 minutes  Psychiatric Specialty Exam:     Blood pressure 129/85, pulse 93, temperature 98.5 F (36.9 C), temperature source Oral, resp. rate 16, height 5\' 7"  (1.702 m), weight 98.09 kg (216 lb 4 oz).Body mass index is 33.86 kg/(m^2).  General Appearance: Fairly Groomed  Engineer, water::  Fair  Speech:  Clear and Coherent  Volume:  Normal  Mood:  Euthymic  Affect:  Appropriate  Thought Process:  Coherent and Goal Directed  Orientation:  Full (Time, Place, and Person)  Thought Content:  plans of how to maintain his health, worries, concerns  Suicidal Thoughts:  No  Homicidal Thoughts:  No  Memory:  Immediate;   Fair Recent;   Fair Remote;   Fair  Judgement:  Fair  Insight:  Present  Psychomotor Activity:  Normal  Concentration:  Fair  Recall:  [employer] [place]  Language: Fair  Akathisia:  No  Handed:    AIMS (if indicated):     Assets:  Desire for Improvement Housing  Sleep:  Number of Hours: 5.75    Musculoskeletal: Strength & Muscle Tone: decreased Gait & Station: unsteady Patient leans: N/A   Mental Status Per Nursing Assessment::   On Admission:  Self-harm thoughts  Current Mental Status by Physician: In full contact with reality. There are no suiidal ideas, plans or intent. His mood is euthymic, his affect is appropriate. He is willing and motivated to pursue outpatient follow up   Loss Factors: Decline in physical health  Historical Factors: NA  Risk Reduction Factors:   NA  Continued Clinical Symptoms:  Depression:   Insomnia More than one psychiatric diagnosis  Cognitive Features That Contribute To Risk:  Closed-mindedness Polarized thinking Thought constriction (tunnel vision)    Suicide Risk:  Minimal: No identifiable suicidal ideation.  Patients presenting with no risk factors but with morbid ruminations; may be  classified as minimal risk based on the severity of the depressive symptoms  Discharge Diagnoses:   AXIS I:  Bipolar Disorder, Depressed, PTSD AXIS II:  Deferred AXIS III:   Past Medical History  Diagnosis Date  . Arthritis   . Diabetes mellitus   . Hypertension   . Depression   . High cholesterol   . GERD (gastroesophageal reflux disease)   . Neuropathic pain   . Cancer    AXIS IV:  other psychosocial or environmental problems AXIS V:  61-70 mild symptoms  Plan Of Care/Follow-up recommendations:  Activity:  as tolerated Diet:  regular Follow [facility] [place] Is patient on multiple antipsychotic therapies at discharge:  No   Has Patient had three or more failed trials of antipsychotic monotherapy by history:  No  Recommended Plan for Multiple Antipsychotic Therapies: NA    Antar Milks A 04/10/2013, 10:22 AM

## 2013-04-10 NOTE — Progress Notes (Signed)
Patient ID: [name], male   DOB: 1955/04/03, 58 y.o.   MRN: [id number] Patient was discharged ambulatory to ride home with police.  He denies SI/HI.  He verbalizes understanding of his discharge meds and followup.  He was given a supply of meds and scripts from MD.  He is going to followup at [facility] for diabetes management.

## 2013-04-10 NOTE — Clinical Social Work Note (Signed)
Pt plans to [facility] (next appt in April). Pt refused to follow up with therapist but plans to continue attending AA meetings and Mental Health Association groups. Pt provided with [facility] resource pamphlets. Pt also encouraged by CSW to contact [facility] weekly to see if there are any cancellations or openings to see psychiatrist sooner.

## 2013-04-10 NOTE — Progress Notes (Signed)
[facility] Adult Case Management Discharge Plan :  Will you be returning to the same living situation after discharge: Yes,  home At discharge, do you have transportation home?:Yes,  sherriff coming at 11am Do you have the ability to pay for your medications:Yes,  Medicaid/VA benefits  Release of information consent forms completed and submitted to medical records by csw.  Patient to Follow up at: Follow-up Information   Follow up with [facility] On 06/14/2013. (Appt. for medication management at 10:00AM. Please call weekly to check for cancelations if you would like to move your appt up. )    Contact information:   [address] [place], [place] [address] Phone: [phone] Fax: [phone] (ATTN: Dr. Albertine Patricia)      Patient denies SI/HI:   Yes,  during group/self report.    Safety Planning and Suicide Prevention discussed:  Yes,  SPE completed with pt as he did not consent to family contact (identified family members as triggers and significant stressors). SPI pamphlet provided to pt and he was encouraged to share information with support network, ask questions, and talk about any concerns relating to SPE.  Smart, Gerlene Glassburn LCSWA  04/10/2013, 10:13 AM

## 2013-04-10 NOTE — Progress Notes (Signed)
Recreation Therapy Notes  Date: 02.24.2015 Time: 2:45pm Location: [address]   Group Topic: Wellness  Goal Area(s) Addresses:  Patient will define components of whole wellness. Patient will verbalize benefit of whole wellness.  Behavioral Response: Did not attend.   Laureen Ochs Delenn Ahn, LRT/CTRS  Jamecia Lerman L 04/10/2013 9:25 AM

## 2013-04-10 NOTE — Progress Notes (Signed)
The focus of this group is to educate the patient on the purpose and policies of crisis stabilization and provide a format to answer questions about their admission.  The group details unit policies and expectations of patients while admitted.Patient was an active participant in group.  He did group exercises, practice abdominal breathing, and set a goal of making plans to get home safely

## 2013-04-10 NOTE — Clinical Social Work Note (Signed)
Pt will be picked up by sheriff at 11am today and taken to his address. MD notified that pt needs 14 day med supply and is being picked up early.   [place], [place] 04/10/2013 10:03 AM
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
## 2013-04-12 NOTE — Progress Notes (Signed)
Patient Discharge Instructions:  After Visit Summary (AVS):   Faxed to:  04/12/13 Discharge Summary Note:   Faxed to:  04/12/13 Psychiatric Admission Assessment Note:   Faxed to:  04/12/13 Suicide Risk Assessment - Discharge Assessment:   Faxed to:  04/12/13 Faxed/Sent to the Next Level Care provider:  04/12/13 Faxed to [facility] @ [phone]  Patsey Berthold, 04/12/2013, 3:11 PM

## 2013-07-20 ENCOUNTER — Encounter (HOSPITAL_COMMUNITY): Payer: Self-pay | Admitting: Emergency Medicine

## 2013-07-20 ENCOUNTER — Emergency Department (HOSPITAL_COMMUNITY): Payer: Non-veteran care

## 2013-07-20 ENCOUNTER — Emergency Department (HOSPITAL_COMMUNITY)
Admission: EM | Admit: 2013-07-20 | Discharge: 2013-07-23 | Disposition: A | Discharge status: Other Acute Inpatient Hospital | Payer: Non-veteran care | Attending: Emergency Medicine | Admitting: Emergency Medicine

## 2013-07-20 DIAGNOSIS — F3289 Other specified depressive episodes: Secondary | ICD-10-CM | POA: Insufficient documentation

## 2013-07-20 DIAGNOSIS — Z859 Personal history of malignant neoplasm, unspecified: Secondary | ICD-10-CM | POA: Insufficient documentation

## 2013-07-20 DIAGNOSIS — IMO0002 Reserved for concepts with insufficient information to code with codable children: Secondary | ICD-10-CM

## 2013-07-20 DIAGNOSIS — N509 Disorder of male genital organs, unspecified: Secondary | ICD-10-CM | POA: Insufficient documentation

## 2013-07-20 DIAGNOSIS — N390 Urinary tract infection, site not specified: Secondary | ICD-10-CM | POA: Insufficient documentation

## 2013-07-20 DIAGNOSIS — F329 Major depressive disorder, single episode, unspecified: Secondary | ICD-10-CM | POA: Insufficient documentation

## 2013-07-20 DIAGNOSIS — F17200 Nicotine dependence, unspecified, uncomplicated: Secondary | ICD-10-CM | POA: Insufficient documentation

## 2013-07-20 DIAGNOSIS — Z794 Long term (current) use of insulin: Secondary | ICD-10-CM | POA: Insufficient documentation

## 2013-07-20 DIAGNOSIS — Z8719 Personal history of other diseases of the digestive system: Secondary | ICD-10-CM | POA: Insufficient documentation

## 2013-07-20 DIAGNOSIS — I10 Essential (primary) hypertension: Secondary | ICD-10-CM | POA: Insufficient documentation

## 2013-07-20 DIAGNOSIS — F32A Depression, unspecified: Secondary | ICD-10-CM

## 2013-07-20 DIAGNOSIS — E7800 Pure hypercholesterolemia, unspecified: Secondary | ICD-10-CM | POA: Insufficient documentation

## 2013-07-20 DIAGNOSIS — Z8739 Personal history of other diseases of the musculoskeletal system and connective tissue: Secondary | ICD-10-CM | POA: Insufficient documentation

## 2013-07-20 DIAGNOSIS — Z79899 Other long term (current) drug therapy: Secondary | ICD-10-CM | POA: Insufficient documentation

## 2013-07-20 DIAGNOSIS — E119 Type 2 diabetes mellitus without complications: Secondary | ICD-10-CM | POA: Insufficient documentation

## 2013-07-20 HISTORY — DX: Personal history of suicidal behavior: Z91.51

## 2013-07-20 HISTORY — DX: Personal history of self-harm: Z91.5

## 2013-07-20 LAB — CBC WITH DIFFERENTIAL/PLATELET
Basophils Absolute: 0 10*3/uL (ref 0.0–0.1)
Basophils Relative: 0 % (ref 0–1)
Eosinophils Absolute: 0.1 10*3/uL (ref 0.0–0.7)
Eosinophils Relative: 1 % (ref 0–5)
HCT: 41.3 % (ref 39.0–52.0)
Hemoglobin: 14.6 g/dL (ref 13.0–17.0)
LYMPHS ABS: 2 10*3/uL (ref 0.7–4.0)
LYMPHS PCT: 36 % (ref 12–46)
MCH: 29 pg (ref 26.0–34.0)
MCHC: 35.4 g/dL (ref 30.0–36.0)
MCV: 82.1 fL (ref 78.0–100.0)
Monocytes Absolute: 0.6 10*3/uL (ref 0.1–1.0)
Monocytes Relative: 11 % (ref 3–12)
NEUTROS ABS: 2.9 10*3/uL (ref 1.7–7.7)
NEUTROS PCT: 52 % (ref 43–77)
PLATELETS: 212 10*3/uL (ref 150–400)
RBC: 5.03 MIL/uL (ref 4.22–5.81)
RDW: 13.1 % (ref 11.5–15.5)
WBC: 5.6 10*3/uL (ref 4.0–10.5)

## 2013-07-20 LAB — URINE MICROSCOPIC-ADD ON

## 2013-07-20 LAB — URINALYSIS, ROUTINE W REFLEX MICROSCOPIC
Bilirubin Urine: NEGATIVE
Glucose, UA: >1000 mg/dL — AB
HGB URINE DIPSTICK: NEGATIVE
Ketones, ur: NEGATIVE mg/dL
Nitrite: POSITIVE — AB
PROTEIN: NEGATIVE mg/dL
Specific Gravity, Urine: 1.035 — ABNORMAL HIGH (ref 1.005–1.030)
Urobilinogen, UA: 0.2 mg/dL (ref 0.0–1.0)
pH: 5 (ref 5.0–8.0)

## 2013-07-20 LAB — RAPID URINE DRUG SCREEN, HOSP PERFORMED
AMPHETAMINES: NOT DETECTED
Barbiturates: NOT DETECTED
Benzodiazepines: NOT DETECTED
COCAINE: NOT DETECTED
Opiates: NOT DETECTED
TETRAHYDROCANNABINOL: NOT DETECTED

## 2013-07-20 LAB — COMPREHENSIVE METABOLIC PANEL
ALK PHOS: 84 U/L (ref 39–117)
ALT: 15 U/L (ref 0–53)
AST: 15 U/L (ref 0–37)
Albumin: 3.8 g/dL (ref 3.5–5.2)
BUN: 17 mg/dL (ref 6–23)
CO2: 25 mEq/L (ref 19–32)
Calcium: 10.2 mg/dL (ref 8.4–10.5)
Chloride: 96 mEq/L (ref 96–112)
Creatinine, Ser: 0.92 mg/dL (ref 0.50–1.35)
GFR calc Af Amer: >90 mL/min (ref >90)
GFR calc non Af Amer: >90 mL/min (ref >90)
GLUCOSE: 237 mg/dL — AB (ref 70–99)
POTASSIUM: 4.5 meq/L (ref 3.7–5.3)
SODIUM: 134 meq/L — AB (ref 137–147)
TOTAL PROTEIN: 8.2 g/dL (ref 6.0–8.3)
Total Bilirubin: 0.3 mg/dL (ref 0.3–1.2)

## 2013-07-20 LAB — RPR

## 2013-07-20 LAB — ETHANOL: Alcohol, Ethyl (B): <11 mg/dL (ref 0–11)

## 2013-07-20 LAB — HIV ANTIBODY (ROUTINE TESTING W REFLEX): HIV: NONREACTIVE

## 2013-07-20 MED ORDER — ONDANSETRON HCL 4 MG PO TABS: 4.0000 mg | ORAL_TABLET | Freq: Three times a day (TID) | ORAL | Status: DC | PRN

## 2013-07-20 MED ORDER — CIPROFLOXACIN HCL 500 MG PO TABS
500.0000 mg | ORAL_TABLET | Freq: Two times a day (BID) | ORAL | Status: DC
Start: 2013-07-21 — End: 2013-07-23
  Administered 2013-07-21 – 2013-07-22 (×4): 500 mg via ORAL
  Filled 2013-07-20 (×4): qty 1

## 2013-07-20 MED ORDER — DOXYCYCLINE HYCLATE 100 MG PO TABS
100.0000 mg | ORAL_TABLET | Freq: Two times a day (BID) | ORAL | Status: DC
  Administered 2013-07-21 – 2013-07-22 (×4): 100 mg via ORAL
  Filled 2013-07-20 (×4): qty 1

## 2013-07-20 MED ORDER — LAMOTRIGINE 25 MG PO TABS
25.0000 mg | ORAL_TABLET | Freq: Every day | ORAL | Status: DC
  Administered 2013-07-20 – 2013-07-22 (×3): 25 mg via ORAL
  Filled 2013-07-20 (×4): qty 1

## 2013-07-20 MED ORDER — LISINOPRIL 20 MG PO TABS
20.0000 mg | ORAL_TABLET | Freq: Every day | ORAL | Status: DC
  Administered 2013-07-20 – 2013-07-22 (×3): 20 mg via ORAL
  Filled 2013-07-20 (×3): qty 1

## 2013-07-20 MED ORDER — SODIUM CHLORIDE 0.9 % IV BOLUS (SEPSIS)
1000.0000 mL | Freq: Once | INTRAVENOUS | Status: AC
  Administered 2013-07-20: 1000 mL via INTRAVENOUS

## 2013-07-20 MED ORDER — ACETAMINOPHEN 325 MG PO TABS
650.0000 mg | ORAL_TABLET | ORAL | Status: DC | PRN
  Administered 2013-07-20: 650 mg via ORAL
  Filled 2013-07-20: qty 2

## 2013-07-20 MED ORDER — ALUM & MAG HYDROXIDE-SIMETH 200-200-20 MG/5ML PO SUSP: 30.0000 mL | ORAL | Status: DC | PRN

## 2013-07-20 MED ORDER — DEXTROSE 5 % IV SOLN
1.0000 g | Freq: Once | INTRAVENOUS | Status: AC
  Administered 2013-07-20: 1 g via INTRAVENOUS
  Filled 2013-07-20: qty 10

## 2013-07-20 MED ORDER — LORAZEPAM 1 MG PO TABS
1.0000 mg | ORAL_TABLET | Freq: Three times a day (TID) | ORAL | Status: DC | PRN
  Administered 2013-07-20 – 2013-07-22 (×2): 1 mg via ORAL
  Filled 2013-07-20 (×2): qty 1

## 2013-07-20 MED ORDER — METOPROLOL TARTRATE 25 MG PO TABS
25.0000 mg | ORAL_TABLET | Freq: Two times a day (BID) | ORAL | Status: DC
  Administered 2013-07-20 – 2013-07-22 (×5): 25 mg via ORAL
  Filled 2013-07-20 (×7): qty 1

## 2013-07-20 MED ORDER — AZITHROMYCIN 250 MG PO TABS
1000.0000 mg | ORAL_TABLET | Freq: Once | ORAL | Status: AC
  Administered 2013-07-20: 1000 mg via ORAL
  Filled 2013-07-20: qty 4

## 2013-07-20 MED ORDER — ATORVASTATIN CALCIUM 40 MG PO TABS
40.0000 mg | ORAL_TABLET | Freq: Every day | ORAL | Status: DC
  Administered 2013-07-20 – 2013-07-22 (×3): 40 mg via ORAL
  Filled 2013-07-20 (×3): qty 1

## 2013-07-20 NOTE — ED Notes (Addendum)
Per PA, patient to be moved to back.   Patient is having abdominal pain, generalized weakness, swollen testicle, nausea and other symptoms that were not disclosed when RNs did assessment.  Charge RN notified that we need to move to another room.

## 2013-07-20 NOTE — ED Provider Notes (Signed)
Medical screening examination/treatment/procedure(s) were performed by non-physician practitioner and as supervising physician I was immediately available for consultation/collaboration.     Veryl Speak, MD 07/20/13 1600

## 2013-07-20 NOTE — Progress Notes (Signed)
Per, Dr. Gertie Fey the patient reports that he is experiencing HI towards his neighbor.  Patient reports that he feels as if he wants to harm himself.

## 2013-07-20 NOTE — Progress Notes (Signed)
CSW consult to pt regarding to visit to ED. Pt reports that he is being followed at the [facility]. Pt reports that he takes Lamictal and Trazadone as prescribed. Pt reported that over the last 3 weeks he has been feeling homicidal against his upstairs neighbors. Pt reported that he feels the neighbors are trying to make him move out of his apartment. Pt reported that he has been having suicidal ideations. Requested to speak to a psychiatrist. Information shared with attending PA.   [address], [place]

## 2013-07-20 NOTE — ED Provider Notes (Signed)
CSN: [id number]     Arrival date & time 07/20/13  1052 History   First MD Initiated Contact with Patient 07/20/13 1144     Chief Complaint  Patient presents with  . Urinary Tract Infection     (Consider location/radiation/quality/duration/timing/severity/associated sxs/prior Treatment) HPI 58 year old male with history of kidney cancers status post nephrectomy,  insulin-dependent diabetes, depression who presents complaining of dysuria. Patient reports for the past week he has been experiencing burning urination, urinary frequency and urgency. Initially he noticed that his urine was dark however the urine color has improved. Furthermore he also complaining of having right testicular pain and swelling. Endorse nausea, generalized weakness for the past week. Patient felt embarrassed symptoms therefore he did not followup in a timely manner.  He did notice some mild penile discharge. He is sexually active with one partner, however admits that he does not use protection every single time. He has a remote history of gonococcal infection in his early 32s. He denies having fever, chest pain, shortness of breath, productive cough, back pain, rectal pain, hematuria, hematochezia melena. Patient also mentioned that he has been feeling depressed having doing with his current symptoms and then also having extra family stress. He denies any homicidal or suicidal ideation denies any hallucination. He denies self medicating with alcohol or street drugs.      Past Medical History  Diagnosis Date  . Arthritis   . Diabetes mellitus   . Hypertension   . Depression   . High cholesterol   . GERD (gastroesophageal reflux disease)   . Neuropathic pain   . Cancer    History reviewed. No pertinent past surgical history. No family history on file. History  Substance Use Topics  . Smoking status: Current Every Day Smoker -- 0.50 packs/day for 20 years    Types: Cigarettes  . Smokeless tobacco: Not on file  .  Alcohol Use: No     Comment: Pt has been clean for 8 months    Review of Systems  All other systems reviewed and are negative.     Allergies  Morphine and related  Home Medications   Prior to Admission medications   Medication Sig Start Date End Date Taking? Authorizing Provider  atorvastatin (LIPITOR) 80 MG tablet Take 0.5 tablets (40 mg total) by mouth daily. 11/07/12   Elmarie Shiley, NP  glipiZIDE (GLUCOTROL) 5 MG tablet Take 1 tablet (5 mg total) by mouth daily before lunch. 11/07/12   Elmarie Shiley, NP  insulin aspart (NOVOLOG) 100 UNIT/ML injection Inject 0-15 Units into the skin 3 (three) times daily with meals. 04/10/13   Benjamine Mola, FNP  lamoTRIgine (LAMICTAL) 25 MG tablet Take 1 tablet (25 mg total) by mouth daily. 04/10/13   Benjamine Mola, FNP  lisinopril (PRINIVIL,ZESTRIL) 20 MG tablet Take 1 tablet (20 mg total) by mouth daily. 11/07/12   Elmarie Shiley, NP  metFORMIN (GLUCOPHAGE) 1000 MG tablet Take 1 tablet (1,000 mg total) by mouth 2 (two) times daily with a meal. 11/07/12   Elmarie Shiley, NP  metoprolol (LOPRESSOR) 50 MG tablet Take 1 tablet (50 mg total) by mouth 2 (two) times daily. 04/10/13   Benjamine Mola, FNP  traZODone (DESYREL) 150 MG tablet Take 1 tablet (150 mg total) by mouth at bedtime as needed for sleep. 04/10/13   Benjamine Mola, FNP   BP 129/89  Pulse 105  Temp(Src) 98.4 F (36.9 C) (Oral)  Resp 22  Ht 5\' 10"  (1.778 m)  Wt 214 lb  4.8 oz (97.206 kg)  BMI 30.75 kg/m2  SpO2 98% Physical Exam  Constitutional: He is oriented to person, place, and time. He appears well-developed and well-nourished. No distress.  HENT:  Head: Atraumatic.  Eyes: Conjunctivae are normal.  Neck: Normal range of motion. Neck supple.  Cardiovascular: Normal rate and regular rhythm.   Pulmonary/Chest: Effort normal and breath sounds normal.  Abdominal: Soft. There is no tenderness.  Genitourinary:  Penis is uncircumcised, right testicular tenderness on palpation without obvious  palpable cords or masses noted. No scrotal swelling noted, no rash, no abnormal lesions.  Neurological: He is alert and oriented to person, place, and time.  Skin: No rash noted.  Psychiatric: He has a normal mood and affect.    ED Course  Procedures (including critical care time)  2:42 PM The patient presents with dysuria, penile discharge, and right testicular pain and swelling. UA did demonstrate evidence of urinary tract infection. Ultrasound of the scrotum demonstrate a 2.4 cm complex mass to the lower pole in the right testicle. It is uncertain if this represents focal inflammation or neoplasm. Given his recent urinary tract infection I suspect this is infectious in etiology and represents a focal inflammation. Antibiotic (rocephin/zithromax) will be prescribed as well as giving it here in ER. He does have remote history of kidney cancer status post nephrectomy. However I recommend patient to follow up with urologist for reexamination and to ensure resolution of the complex mass as it can also be a neoplasm.  2:54 PM Although patient denies SI or HI he does request to "talk to someone" about his depression. TTS has been consulted and agrees to see pt.    3:12 PM Our social worker has seen and evaluate patient. Patient disclosed that he has been having homicidal ideation towards his neighbor for the past 3 weeks, and also having thought of harming himself without having specific plan. Given this information, we'll have a psychiatrist to evaluate patient. Otherwise patient has been taking his psychiatric medication as prescribed.  3:27 PM Pt's Urinary sxs and penile discharge will be treated with doxycycline 100mg  PO BID x 10 days and cipro 500mg  PO BID x 10 days.  Pt also need to f/u with urology Dr. Janice Norrie next week for further evaluation of his R testicle complex mass.  Urine culture sent.  Care discussed with Dr. Kathrynn Humble.  Labs Review Labs Reviewed  URINALYSIS, ROUTINE W REFLEX  MICROSCOPIC - Abnormal; Notable for the following:    APPearance HAZY (*)    Specific Gravity, Urine 1.035 (*)    Glucose, UA >1000 (*)    Nitrite POSITIVE (*)    Leukocytes, UA SMALL (*)    All other components within normal limits  URINE MICROSCOPIC-ADD ON - Abnormal; Notable for the following:    Bacteria, UA MANY (*)    All other components within normal limits  GC/CHLAMYDIA PROBE AMP  CBC WITH DIFFERENTIAL  RPR  HIV ANTIBODY (ROUTINE TESTING)  COMPREHENSIVE METABOLIC PANEL    Imaging Review US Scrotum  07/20/2013   CLINICAL DATA:  Right testicular pain and swelling.  EXAM: SCROTAL ULTRASOUND  DOPPLER ULTRASOUND OF THE TESTICLES  TECHNIQUE: Complete ultrasound examination of the testicles, epididymis, and other scrotal structures was performed. Color and spectral Doppler ultrasound were also utilized to evaluate blood flow to the testicles.  COMPARISON:  None.  FINDINGS: Right testicle  Measurements: 3.5 x 2.5 x 1.8 cm. Complex mass is seen adjacent to the lower pole of the right testicle which measures  2.4 x 1.8 cm in size. It demonstrates hypervascular flow on Doppler.  Left testicle  Measurements: 3.8 x 6.8 x 2.6 cm. No mass or microlithiasis visualized.  Right epididymis: 3 mm cyst is noted. Otherwise no abnormality seen.  Left epididymis:  Normal in size and appearance.  Hydrocele:  Small left hydrocele was noted.  Varicocele: Bilateral varicoceles are noted with the right greater than the left.  Pulsed Doppler interrogation of both testes demonstrates low resistance arterial and venous waveforms bilaterally.  IMPRESSION: Small left hydrocele.  Small right epididymal cyst.  Bilateral varicoceles are noted with right greater than left.  2.4 cm complex mass is seen adjacent to lower pole of right testicle which demonstrates hypervascular flow on Doppler. It is uncertain if this represents focal inflammation or neoplasm. Clinical correlation is recommended.   Electronically Signed   By: Sabino Dick M.D.   On: 07/20/2013 14:23   [place] Art/ven Flow Abd Pelv Doppler  07/20/2013   CLINICAL DATA:  Right testicular pain and swelling.  EXAM: SCROTAL ULTRASOUND  DOPPLER ULTRASOUND OF THE TESTICLES  TECHNIQUE: Complete ultrasound examination of the testicles, epididymis, and other scrotal structures was performed. Color and spectral Doppler ultrasound were also utilized to evaluate blood flow to the testicles.  COMPARISON:  None.  FINDINGS: Right testicle  Measurements: 3.5 x 2.5 x 1.8 cm. Complex mass is seen adjacent to the lower pole of the right testicle which measures 2.4 x 1.8 cm in size. It demonstrates hypervascular flow on Doppler.  Left testicle  Measurements: 3.8 x 6.8 x 2.6 cm. No mass or microlithiasis visualized.  Right epididymis: 3 mm cyst is noted. Otherwise no abnormality seen.  Left epididymis:  Normal in size and appearance.  Hydrocele:  Small left hydrocele was noted.  Varicocele: Bilateral varicoceles are noted with the right greater than the left.  Pulsed Doppler interrogation of both testes demonstrates low resistance arterial and venous waveforms bilaterally.  IMPRESSION: Small left hydrocele.  Small right epididymal cyst.  Bilateral varicoceles are noted with right greater than left.  2.4 cm complex mass is seen adjacent to lower pole of right testicle which demonstrates hypervascular flow on Doppler. It is uncertain if this represents focal inflammation or neoplasm. Clinical correlation is recommended.   Electronically Signed   By: Sabino Dick M.D.   On: 07/20/2013 14:23     EKG Interpretation None      MDM   Final diagnoses:  UTI (lower urinary tract infection)  Testicular/scrotal pain  Depression    BP 141/106  Pulse 72  Temp(Src) 98.1 F (36.7 C) (Oral)  Resp 18  Ht 5\' 10"  (1.778 m)  Wt 214 lb 4.8 oz (97.206 kg)  BMI 30.75 kg/m2  SpO2 97%  I have reviewed nursing notes and vital signs. I personally reviewed the imaging tests through PACS system  I  reviewed available ER/hospitalization records thought the EMR     Domenic Moras, [place] 07/20/13 1547

## 2013-07-20 NOTE — ED Provider Notes (Signed)
Medical Screening Exam  Patient p/w dysuria, right testicular pain and swelling, nausea, generalized weakness, "feeling hot," x 1 week.  Has hx kidney cancer, s/p nephrectomy.   Pt triaged to fast track. A&O, NAD, RRR, CTAB, abd soft, nondistended, TTP RUQ, RLQ, no guarding or rebound, suprapubic areas, penis is uncircumcised, right testicular pain, swelling, erythema, tender to palpation.    Pt to move to main ED for further evaluation.  CBC, CMP, STD panel, scrotal US ordered. 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    Clayton Bibles, PA-C 07/20/13 1212

## 2013-07-20 NOTE — ED Notes (Signed)
telepsych in progress 

## 2013-07-20 NOTE — ED Notes (Signed)
PATIENT IS WANTING TO KNOW WHERE HIS SITTER IS THAT SITS WITH HIM IN "THESE TYPES OF ROOMS", HE APPEARS DISAPPOINTED HE DOESN'T HAVE SOMEONE WITH HIM AT ALL TIMES.

## 2013-07-20 NOTE — Discharge Instructions (Signed)
Take doxycycline 100mg  by mouth twice daily for the next 10 days as treatment of your infection.  Follow up with [facility] next week to ensure resolution of your right testicle pain and swelling.  Follow up with Dr. Sabra [name] or with a psychiatrist for management of your depression.  Use resources below   Emergency Department Resource Guide 1) Find a Doctor and Pay Out of Pocket Although you won't have to find out who is covered by your insurance plan, it is a good idea to ask around and get recommendations. You will then need to call the office and see if the doctor you have chosen will accept you as a new patient and what types of options they offer for patients who are self-pay. Some doctors offer discounts or will set up payment plans for their patients who do not have insurance, but you will need to ask so you aren't surprised when you get to your appointment.  2) Contact Your Local Health Department Not all health departments have doctors that can see patients for sick visits, but many do, so it is worth a call to see if yours does. If you don't know where your local health department is, you can check in your phone book. The CDC also has a tool to help you locate your state's health department, and many state websites also have listings of all of their local health departments.  3) Find a [facility] If your illness is not likely to be very severe or complicated, you may want to try a walk in clinic. These are popping up all over the country in pharmacies, drugstores, and shopping centers. They're usually staffed by nurse practitioners or physician assistants that have been trained to treat common illnesses and complaints. They're usually fairly quick and inexpensive. However, if you have serious medical issues or chronic medical problems, these are probably not your best option.  No Primary Care Doctor: - Call Health Connect at  [phone] - they can help you locate a primary care doctor  that  accepts your insurance, provides certain services, etc. - Physician Referral Service- [phone]  Chronic Pain Problems: Organization         Address  Phone   Notes  [facility]  [phone] Patients need to be referred by their primary care doctor.   Medication Assistance: Organization         Address  Phone   Notes  [facility] Medication [facility] [place] [place], [place] [address] [phone] --Must be a resident of [facility] -- Must have NO insurance coverage whatsoever (no Medicaid/ Medicare, etc.) -- The pt. MUST have a primary care doctor that directs their care regularly and follows them in the community   MedAssist  [phone]   [employer]  [phone]    Agencies that provide inexpensive medical care: Organization         Address  Phone   Notes  [place]  [phone]   [name]    [phone]   [facility] [place], [place] [address] [phone]   [place] [address] [place] [phone]   Planned Parenthood    [phone]   [facility]    [phone]   [place] and [place], [place] Guerra Phone:  [phone], Fax:  [phone] Hours  of Operation:  9 am - 6 pm, M-F.  Also accepts Medicaid/Medicare and self-pay.  [facility] for [place], [address], [place] Phone: [phone], Fax: [phone]. Hours of Operation:  8:30 am - 5:30 pm, M-F.  Also accepts Medicaid and self-pay.  [facility] [place] [address], [place] Phone: [phone]   [place], [place], [place] [phone], Ext. [phone] Mondays & Thursdays: 7-9 AM.  First 15 patients are seen on a first come, first serve basis.    [place] Providers:  Organization          Address  Phone   Notes  [facility] [address], [place] [phone] Also accepts self-pay patients.  [facility] [address], [place]  [phone]   [place], [address], [place] [phone]   [facility] [address] [place] [phone]   Lucianne Lei [address], [place]   [phone] Only accepts [place] Access [place] patients after they have their name applied to their card.   Self-Pay (no insurance) in [facility]:  Organization         Address  Phone   Notes  Sickle Cell Patients, [facility] Internal Medicine [place] [phone]   [facility] [name] [phone]   [name] [place]  [place], [place], [place] [phone]   [facility]/Dr. Osei-Bonsu  [address] [place] or [address], [place] [phone] Phone number for both [place] and [place] locations is the same.  Urgent Medical and [facility] [address] [place] [phone]   [facility] [address] [place] or [address] [phone]   [facility] [address] [place] [phone], phone; [phone], fax Sees patients 1st and 3rd Saturday of every month.  Must not qualify for public or private insurance (i.e. Medicaid, Medicare, [place] Health Choice, Veterans' Benefits)  Household income should be no more than 200% of the poverty level The clinic cannot treat you if you are pregnant or think you are pregnant  Sexually transmitted diseases are not treated at the clinic.    Dental Care: Organization         Address  Phone  Notes  [facility] Department of [facility] [place] [phone] Accepts children up to age 5 who are enrolled in [place] or [place]; pregnant women with a Medicaid card; and children who have applied for Medicaid or [place] Health Choice, but were declined, whose parents can pay a reduced fee at time of service.  [facility] Department of [facility]  [address], [place] [phone] Accepts children up to age 65 who are enrolled in [place] or [place]; pregnant women with a Medicaid card; and children who have applied for Medicaid or [place] Health Choice, but were declined, whose parents can pay a reduced fee at time of service.  [place] Adult Dental Access PROGRAM  [place] [phone] Patients are seen by appointment only. Walk-ins are not accepted. [place] will see  patients 59 years of age and older. Monday - Tuesday (8am-5pm) Most Wednesdays (8:30-5pm) $30 per visit, cash only  [facility] Adult Dental Access PROGRAM  [address], [facility] [phone] Patients are seen by appointment only. Walk-ins are not accepted. [facility] [place] will see patients 6 years of age and older. One Wednesday Evening (Monthly: Volunteer Based).  $30 per visit, cash only  [place]  [phone] for adults; Children under age 69, call Graduate Pediatric Dentistry at [phone]. Children aged 22-14, please call [phone] to request a pediatric application.  Dental services are provided in all areas of dental care including fillings, crowns and bridges, complete and partial dentures, implants, gum treatment, root canals, and extractions. Preventive care is also provided. Treatment is provided to both adults and children. Patients are selected via a lottery and there is often a waiting list.   [facility] [address], [place]  [phone] [email or web address]   [facility] [address] [place], [place]  [phone], Ext. [phone] Second and Fourth Thursday of each month, opens at 6:30 AM; Clinic ends at 9 AM.  Patients are seen on a first-come first-served basis, and a limited number are seen during each clinic.   [facility]  [address] Hillard Danker [place], [place] [phone]   Eligibility Requirements You must have lived in [place], [place], or [place] counties for at least the last three months.   You cannot be eligible for state or federal sponsored [employer], including [employer], [place], or [employer].   You generally cannot be eligible for healthcare insurance through your employer.    How to apply: Eligibility screenings are held every Tuesday and Wednesday afternoon from 1:00 pm until 4:00 pm. You do not need an appointment for the interview!  [facility] [address] [place], [place]   [place]  [place] Department  [place]  [phone]    Behavioral Health Resources in the Community: Intensive Outpatient Programs Organization         Address  Phone  Notes  [place]. [address], [place], [place] [phone]   [facility] Outpatient [address] [place], [place]   ADS: Alcohol & Drug Svcs [address], [place], [place]   [place] [address],  [place], [place] or [phone]   Substance Abuse Resources Organization         Address  Phone  Notes  Alcohol and Drug Services  [phone]   [place]  [phone]   [facility] [place]   Chinita Pester  [phone]   Residential & Outpatient Substance Abuse Program  [phone]   Psychological Services Organization         Address  Phone  Notes  [facility] [place]  [place]  [phone]    [place] [address] [place] or [phone]    Mobile Crisis Teams Organization         Address  Phone  Notes  [facility]atives, Mobile Crisis Care Unit  [phone]   Assertive Psychotherapeutic Services  [address] [place], [place]   [facility] [address] [place] [phone]    Self-Help/Support Groups Organization  Address  Phone             Notes  [place]. of [place] - variety of support groups  [place] Call for more information  Narcotics Anonymous (NA), [facility]aring [facility] [address], [employer] [place]  2 meetings at this location   Special educational needs teacher         Address  Phone  Notes  ASAP Residential Treatment [place],    [place]  [phone]   [facility]  [address] [place] [phone], [place], [place]   [place], [place] [phone] Admissions: 8am-3pm M-F  Incentives Substance [place] [address]    [place], [place] [phone]   [facility] [address] [place], [place], [place]   [facility] [address],  [place], [place]   [facility]ight Programs - Intensive Outpatient [address] Kristeen [address], [place], [place]   [facility] [place] ([place].,  [place], [place] [phone] or [phone]   [facility]idential Treatment Services (RTS) [address] [place], [place] Accepts Medicaid  [facility] [place] [address],  [place] [phone] Substance Abuse/Addiction Treatment   [facility] Organization         Address  Phone  Notes  [facility]  [phone]   Domenic Schwab, PhD [address] Arlis [place], [place]   [phone] or [phone]   [place]  [place], [place] [phone]   [facility] [address] [place], [place] [phone] Insurance/Medicaid/sponsorship through [facility] and Families [address] [place]                                    [place], [place] [phone] [place] [address] [place], [place] [phone]    Dr. Adele Schilder  [phone]   [facility] Dept. 1) [address], [place] 2) [place] 3)  [place] [address], [place] [phone]  [phone]   [place] [phone] or [phone] (After Hours)

## 2013-07-20 NOTE — ED Notes (Signed)
IV attempt x 1; pt requested another RN

## 2013-07-20 NOTE — ED Notes (Signed)
ORDERED PATIENT A TRAY FROM [place], HE ARRIVED AFTER PATIENTS HAD ORDERED THERE DINNER, PATIENT TOOK [place] SANDWICH IN THE MEANTIME, BUT WANTS A WARM MEAL TRAY.

## 2013-07-20 NOTE — ED Notes (Signed)
Pt sts he was recently dx with a UTI and seen and treated at [facility]. sts the dysuria is getting worse and doesn't feel like he is getting better. Denies blood in urine, sts it has been dark in color at one point but its back to regular color now. sts he thinks he has a knot on his groin. Nad, skin warm and dry, resp e/u.

## 2013-07-21 ENCOUNTER — Encounter (HOSPITAL_COMMUNITY): Payer: Self-pay | Admitting: Emergency Medicine

## 2013-07-21 LAB — CBG MONITORING, ED: Glucose-Capillary: 152 mg/dL — ABNORMAL HIGH (ref 70–99)

## 2013-07-21 MED ORDER — GLIPIZIDE 5 MG PO TABS
5.0000 mg | ORAL_TABLET | Freq: Every day | ORAL | Status: DC
  Filled 2013-07-21 (×2): qty 1

## 2013-07-21 MED ORDER — METFORMIN HCL 500 MG PO TABS
1000.0000 mg | ORAL_TABLET | Freq: Two times a day (BID) | ORAL | Status: DC
  Administered 2013-07-21 – 2013-07-22 (×3): 1000 mg via ORAL
  Filled 2013-07-21 (×4): qty 2

## 2013-07-21 NOTE — ED Provider Notes (Signed)
Medical screening examination/treatment/procedure(s) were performed by non-physician practitioner and as supervising physician I was immediately available for consultation/collaboration.   EKG Interpretation None       Richarda Blade, MD 07/21/13 [phone]

## 2013-07-21 NOTE — Progress Notes (Signed)
Weekend CSW attempting to contact [facility] regarding disposition plan.   Tilden Fossa, MSW, [place]  Clinical Social Worker  [facility] Emergency Dept.  [phone]

## 2013-07-21 NOTE — Progress Notes (Addendum)
[facility]20[facility]  Currently no beds available [facility]: @2045  Currently no beds available  [place]mberley Kral Cathey Disposition MHT

## 2013-07-22 ENCOUNTER — Encounter (HOSPITAL_COMMUNITY): Payer: Self-pay | Admitting: Emergency Medicine

## 2013-07-22 LAB — URINE CULTURE

## 2013-07-22 LAB — CBG MONITORING, ED: GLUCOSE-CAPILLARY: 194 mg/dL — AB (ref 70–99)

## 2013-07-22 MED ORDER — GLIPIZIDE 10 MG PO TABS
10.0000 mg | ORAL_TABLET | Freq: Every day | ORAL | Status: DC
  Administered 2013-07-22: 10 mg via ORAL
  Filled 2013-07-22: qty 1

## 2013-07-22 NOTE — ED Notes (Signed)
Called [facility] at 2310, they acknowledge transport on site at 2318.

## 2013-07-22 NOTE — ED Provider Notes (Signed)
[facility] at [facility] Pt agrees. [place], MD 07/23/13 [phone]

## 2013-07-22 NOTE — ED Notes (Signed)
Received call to transfer patient to [facility] after 11pm tonight. Updated patient on plan of care.

## 2013-07-22 NOTE — BH Assessment (Signed)
Per Inocencio Homes, [facility], an adult 28-hall male bed will be available after 2300. Consulted with Serena Colonel, NP who accepted Pt to the service of Dr. Mylinda Latina, room [phone]. Notified Dr. Stevie Kern and ED RN of acceptance.   Orpah Greek Rosana Hoes, [facility] Triage Specialist [phone]

## 2013-07-22 NOTE — BH Assessment (Signed)
[place] Assessment Progress Note  Completed reassessment on pt via tele assessment.  Pt says he continues to feel suicidal and despair due to multiple stressors and many circumstances including a reduction of food stamps and problems with his neighbors.  He says he is disabled and mostly gets his treatment from the [place], but that the [place] reserves most of it's treatment for those who have been in the war.  Pt says he has had IP treatment at [facility] in the past.  TTS will continue to seek placement.

## 2013-07-23 ENCOUNTER — Encounter (HOSPITAL_COMMUNITY): Payer: Self-pay

## 2013-07-23 ENCOUNTER — Inpatient Hospital Stay (HOSPITAL_COMMUNITY)
Admission: AD | Admit: 2013-07-23 | Discharge: 2013-07-27 | DRG: 885 | Disposition: A | Discharge status: Home / Self Care | Payer: Medicaid Other | Source: Intra-hospital | Attending: Psychiatry | Admitting: Psychiatry

## 2013-07-23 DIAGNOSIS — C649 Malignant neoplasm of unspecified kidney, except renal pelvis: Secondary | ICD-10-CM

## 2013-07-23 DIAGNOSIS — E7800 Pure hypercholesterolemia, unspecified: Secondary | ICD-10-CM | POA: Diagnosis present

## 2013-07-23 DIAGNOSIS — F411 Generalized anxiety disorder: Secondary | ICD-10-CM | POA: Diagnosis present

## 2013-07-23 DIAGNOSIS — E785 Hyperlipidemia, unspecified: Secondary | ICD-10-CM

## 2013-07-23 DIAGNOSIS — M1990 Unspecified osteoarthritis, unspecified site: Secondary | ICD-10-CM | POA: Diagnosis present

## 2013-07-23 DIAGNOSIS — R079 Chest pain, unspecified: Secondary | ICD-10-CM

## 2013-07-23 DIAGNOSIS — E119 Type 2 diabetes mellitus without complications: Secondary | ICD-10-CM | POA: Diagnosis present

## 2013-07-23 DIAGNOSIS — F322 Major depressive disorder, single episode, severe without psychotic features: Secondary | ICD-10-CM | POA: Diagnosis not present

## 2013-07-23 DIAGNOSIS — F3130 Bipolar disorder, current episode depressed, mild or moderate severity, unspecified: Secondary | ICD-10-CM | POA: Diagnosis not present

## 2013-07-23 DIAGNOSIS — R45851 Suicidal ideations: Secondary | ICD-10-CM | POA: Diagnosis not present

## 2013-07-23 DIAGNOSIS — Z598 Other problems related to housing and economic circumstances: Secondary | ICD-10-CM

## 2013-07-23 DIAGNOSIS — F17200 Nicotine dependence, unspecified, uncomplicated: Secondary | ICD-10-CM | POA: Diagnosis present

## 2013-07-23 DIAGNOSIS — F333 Major depressive disorder, recurrent, severe with psychotic symptoms: Secondary | ICD-10-CM

## 2013-07-23 DIAGNOSIS — I10 Essential (primary) hypertension: Secondary | ICD-10-CM | POA: Diagnosis present

## 2013-07-23 DIAGNOSIS — Z5987 Material hardship due to limited financial resources, not elsewhere classified: Secondary | ICD-10-CM

## 2013-07-23 DIAGNOSIS — F410 Panic disorder [episodic paroxysmal anxiety] without agoraphobia: Secondary | ICD-10-CM | POA: Diagnosis present

## 2013-07-23 DIAGNOSIS — M4120 Other idiopathic scoliosis, site unspecified: Secondary | ICD-10-CM | POA: Diagnosis present

## 2013-07-23 DIAGNOSIS — K219 Gastro-esophageal reflux disease without esophagitis: Secondary | ICD-10-CM | POA: Diagnosis present

## 2013-07-23 DIAGNOSIS — F4310 Post-traumatic stress disorder, unspecified: Secondary | ICD-10-CM

## 2013-07-23 DIAGNOSIS — F319 Bipolar disorder, unspecified: Secondary | ICD-10-CM | POA: Diagnosis present

## 2013-07-23 DIAGNOSIS — G4700 Insomnia, unspecified: Secondary | ICD-10-CM | POA: Diagnosis present

## 2013-07-23 LAB — GLUCOSE, CAPILLARY: GLUCOSE-CAPILLARY: 213 mg/dL — AB (ref 70–99)

## 2013-07-23 MED ORDER — METFORMIN HCL 500 MG PO TABS
1000.0000 mg | ORAL_TABLET | Freq: Two times a day (BID) | ORAL | Status: DC
  Administered 2013-07-23 – 2013-07-27 (×9): 1000 mg via ORAL
  Filled 2013-07-23: qty 12
  Filled 2013-07-23 (×3): qty 2
  Filled 2013-07-23: qty 12
  Filled 2013-07-23 (×10): qty 2

## 2013-07-23 MED ORDER — ATORVASTATIN CALCIUM 40 MG PO TABS
40.0000 mg | ORAL_TABLET | Freq: Every day | ORAL | Status: DC
  Administered 2013-07-23 – 2013-07-27 (×5): 40 mg via ORAL
  Filled 2013-07-23 (×6): qty 1
  Filled 2013-07-23: qty 3
  Filled 2013-07-23: qty 1

## 2013-07-23 MED ORDER — LISINOPRIL 20 MG PO TABS
20.0000 mg | ORAL_TABLET | Freq: Every day | ORAL | Status: DC
  Administered 2013-07-23 – 2013-07-27 (×5): 20 mg via ORAL
  Filled 2013-07-23 (×5): qty 1
  Filled 2013-07-23: qty 3
  Filled 2013-07-23 (×2): qty 1

## 2013-07-23 MED ORDER — METOPROLOL TARTRATE 25 MG PO TABS
25.0000 mg | ORAL_TABLET | Freq: Two times a day (BID) | ORAL | Status: DC
  Administered 2013-07-23 – 2013-07-27 (×9): 25 mg via ORAL
  Filled 2013-07-23: qty 1
  Filled 2013-07-23: qty 6
  Filled 2013-07-23 (×12): qty 1
  Filled 2013-07-23: qty 6

## 2013-07-23 MED ORDER — ALUM & MAG HYDROXIDE-SIMETH 200-200-20 MG/5ML PO SUSP: 30.0000 mL | ORAL | Status: DC | PRN

## 2013-07-23 MED ORDER — ACETAMINOPHEN 325 MG PO TABS: 650.0000 mg | ORAL_TABLET | Freq: Four times a day (QID) | ORAL | Status: DC | PRN

## 2013-07-23 MED ORDER — TRAZODONE HCL 150 MG PO TABS
150.0000 mg | ORAL_TABLET | Freq: Every evening | ORAL | Status: DC | PRN
  Administered 2013-07-23 – 2013-07-26 (×4): 150 mg via ORAL
  Filled 2013-07-23: qty 3
  Filled 2013-07-23 (×4): qty 1

## 2013-07-23 MED ORDER — LAMOTRIGINE 25 MG PO TABS
25.0000 mg | ORAL_TABLET | Freq: Two times a day (BID) | ORAL | Status: DC
  Administered 2013-07-24 – 2013-07-27 (×7): 25 mg via ORAL
  Filled 2013-07-23: qty 6
  Filled 2013-07-23 (×11): qty 1
  Filled 2013-07-23: qty 6

## 2013-07-23 MED ORDER — LAMOTRIGINE 25 MG PO TABS
25.0000 mg | ORAL_TABLET | Freq: Every day | ORAL | Status: DC
  Filled 2013-07-23 (×3): qty 1

## 2013-07-23 MED ORDER — MAGNESIUM HYDROXIDE 400 MG/5ML PO SUSP: 30.0000 mL | Freq: Every day | ORAL | Status: DC | PRN

## 2013-07-23 MED ORDER — GLIPIZIDE 10 MG PO TABS
10.0000 mg | ORAL_TABLET | Freq: Every day | ORAL | Status: DC
  Administered 2013-07-23 – 2013-07-27 (×5): 10 mg via ORAL
  Filled 2013-07-23 (×4): qty 1
  Filled 2013-07-23: qty 2
  Filled 2013-07-23: qty 1
  Filled 2013-07-23: qty 3
  Filled 2013-07-23 (×2): qty 1

## 2013-07-23 NOTE — Progress Notes (Signed)
Adult Psychoeducational Group Note  Date:  07/23/2013 Time:  10:33 PM  Group Topic/Focus:  Wrap-Up Group:   The focus of this group is to help patients review their daily goal of treatment and discuss progress on daily workbooks.  Participation Level:  Did Not Attend  Participation Quality:  Drowsy  Affect:  Flat  Cognitive:  Confused  Insight: Lacking  Engagement in Group:  None  Modes of Intervention:  none   Additional Comments:  Pt chose not to come to group. He said that he was to tired to come to group   [place] Cervi R Angel Hobdy 07/23/2013, 10:33 PM
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
## 2013-07-23 NOTE — BHH Group Notes (Signed)
[facility] LCSW Aftercare Discharge Planning Group Note   07/23/2013 9:41 AM    Participation Quality:  Appropraite  Mood/Affect:  Appropriate  Depression Rating:  6  Anxiety Rating:  5  Thoughts of Suicide:  No  Will you contract for safety?   NA  Current AVH:  No  Plan for Discharge/Comments:  Patient attended discharge planning group and actively participated in group.  He will follow up with [facility] for outpatient services. CSW provided all participants with daily workbook.   Transportation Means: Patient has transportation.   Supports:  Patient has limited  support system.   Analea Muller, [name]

## 2013-07-23 NOTE — Progress Notes (Signed)
D: Pt denies SI/HI/AVH. Pt is pleasant and cooperative. Pt stated he feels better.  A: Pt was offered support and encouragement. Pt was given scheduled medications. Pt was encourage to attend groups. Q 15 minute checks were done for safety.  R: interacts well with peers and staff. Pt is taking medication. Pt has no complaints at this time.Pt receptive to treatment and safety maintained on unit.

## 2013-07-23 NOTE — BHH Counselor (Signed)
Adult Psychosocial Assessment Update Interdisciplinary Team  Previous [facility] admissions/discharges:  Admissions Discharges  Date:  04/02/13 Date:    2/34/15  Date: Date:  Date: Date:  Date: Date:  Date: Date:   Changes since the last Psychosocial Assessment (including adherence to outpatient mental health and/or substance abuse treatment, situational issues contributing to decompensation and/or relapse).  Patient reports admitting with increased depression and SI as a result of problems with neighbors in his apartment building. Patient also reports food stamp being cut as a stressor.               Discharge Plan 1. Will you be returning to the same living situation after discharge?   Yes: No:      If no, what is your plan?    Yes, patient reports plans to return to his home.       2. Would you like a referral for services when you are discharged? Yes:     If yes, for what services?  No:       Yes.  Patient advised of being followed by [facility].       Summary and Recommendations (to be completed by the evaluator) .  [name] is a 58 years old African American male admitted with Bipolar Disorder.  He will benefit from crisis stabilization, evaluation for medication, psycho-education groups for coping skills development, group therapy and case management for discharge planning.                      Signature:  [name] Riata Ikeda, 07/23/2013 12:55 PM
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
## 2013-07-23 NOTE — BHH Group Notes (Signed)
[facility] LCSW Group Therapy          Overcoming Obstacles       1:15 -2:30        07/23/2013       Type of Therapy:  Group Therapy  Participation Level:  Appropriate  Participation Quality:  Appropriate  Affect:  Appropriate, Alert  Cognitive:  Attentive Appropriate  Insight: Developing/Improving Engaged  Engagement in Therapy: Developing/Imprvoing Engaged  Modes of Intervention:  Discussion Exploration  Education Rapport BuildingProblem-Solving Support  Summary of Progress/Problems:  The main focus of today's group was overcoming obstacles.  Patient shared the obstacle he has to overcome is problems with his neighbors.  Patient able to identify appropriate coping skills.   [name] 07/23/2013

## 2013-07-23 NOTE — Tx Team (Signed)
Initial Interdisciplinary Treatment Plan  PATIENT STRENGTHS: (choose at least two) General fund of knowledge Supportive family/friends  PATIENT STRESSORS: Health problems reduction benefits   PROBLEM LIST: Problem List/Patient Goals Date to be addressed Date deferred Reason deferred Estimated date of resolution  Depression 07/23/13     UTI 07/23/13     Anxiety 07/23/13                                          DISCHARGE CRITERIA:  Adequate post-discharge living arrangements Motivation to continue treatment in a less acute level of care Verbal commitment to aftercare and medication compliance  PRELIMINARY DISCHARGE PLAN: Attend PHP/IOP Attend 12-step recovery group  PATIENT/FAMIILY INVOLVEMENT: This treatment plan has been presented to and reviewed with the patient, [name].  The patient and family have been given the opportunity to ask questions and make suggestions.  Providence Crosby 07/23/2013, 2:17 AM

## 2013-07-23 NOTE — Progress Notes (Signed)
D) Pt has been attending the groups. Affect and mood are within normal limits. Pt states that he is feeling much better today, although Pt rated his depression and hopelessness both at a 3 and admits to thoughts States "I spent two days over in [place] on the Psych unit. So that means I have been here for three days and not one".  A) Encouragement given to talk with the Doctor and request discharge. Provided with reassurance and praise, along with encouragement. R) Pt. Is attending the groups.

## 2013-07-24 DIAGNOSIS — F322 Major depressive disorder, single episode, severe without psychotic features: Secondary | ICD-10-CM | POA: Diagnosis not present

## 2013-07-24 LAB — GLUCOSE, CAPILLARY
GLUCOSE-CAPILLARY: 164 mg/dL — AB (ref 70–99)
Glucose-Capillary: 146 mg/dL — ABNORMAL HIGH (ref 70–99)

## 2013-07-24 MED ORDER — ENSURE COMPLETE PO LIQD: 237.0000 mL | Freq: Two times a day (BID) | ORAL | Status: DC

## 2013-07-24 NOTE — Progress Notes (Signed)
NUTRITION ASSESSMENT  Pt identified as at risk on the Malnutrition Screen Tool  INTERVENTION: 1. Educated patient on the importance of nutrition and encouraged intake of food and beverages. 2. Ensure Complete BID  NUTRITION DIAGNOSIS: Unintentional weight loss related to sub-optimal intake as evidenced by pt report.   Goal: Pt to meet >/= 90% of their estimated nutrition needs.  Monitor:  PO intake  Assessment:  Pt presents for the treatment of SI and depression.  Met with pt who reports eating 2 meals/day PTA, one of those is a "good" meal which consists of baked chicken, green beans, and corn. States he's been eating >50% of meals since admission. Reports he's lost 12 pounds in the past year or so. Agreeable to getting Ensure Complete during admission.    59 y.o. male  Height: Ht Readings from Last 1 Encounters:  07/23/13 $RemoveB'5\' 8"'TwNamzrA$  (1.727 m)    Weight: Wt Readings from Last 1 Encounters:  07/23/13 208 lb (94.348 kg)    Weight Hx: Wt Readings from Last 10 Encounters:  07/23/13 208 lb (94.348 kg)  07/20/13 214 lb 4.8 oz (97.206 kg)  04/02/13 216 lb 4 oz (98.09 kg)  11/03/12 204 lb (92.534 kg)  03/23/12 211 lb (95.709 kg)    BMI:  Body mass index is 31.63 kg/(m^2). Pt meets criteria for class I obesity based on current BMI.  Estimated Nutritional Needs: Kcal: 15-20 kcal/kg Protein: > 1 gram protein/kg Fluid: 1 ml/kcal  Diet Order: Carb Control Pt is also offered choice of unit snacks mid-morning and mid-afternoon.  Pt is eating as desired.   Lab results and medications reviewed.   Carlis Stable MS, [place], LDN [phone] Pager [phone] Weekend/After Hours Pager

## 2013-07-24 NOTE — Progress Notes (Signed)
Patient ID: [name], male   DOB: 01-31-1956, 58 y.o.   MRN: [id number] Patient currently asleep; no s/s of distress noted. Respirations regular and unlabored. No complaints at this time.

## 2013-07-24 NOTE — Progress Notes (Signed)
[facility] MD Progress Note  07/24/2013 7:15 PM [name]  MRN:  [id number] Subjective:  Patient reports he is feeling better Objective- I have met with patient and I have discussed case with Nursing Staff. Patient has remained calm and has participated in  Milieu activities, group therapy sessions. He is reporting improvement, although still feeling depressed. Denies medication side effects Diagnosis:   AXIS I: Major Depression, single episode SEVERE  AXIS II: Deferred  AXIS III:  Past Medical History   Diagnosis  Date   .  PANIC DISORDER    .  HYPERTENSION    .  Esophageal reflux    .  GERD (gastroesophageal reflux disease)    .  Osteoarthritis    .  Scoliosis    .  Depression     AXIS IV: occupational problems and problems with primary support group  AXIS V: 50  DSM5:  Total Time spent with patient: 20 minutes    ADL's:  Intact  Sleep: fair   Appetite:  Good  Suicidal Ideation:  Denies any suicidal ideations  Homicidal Ideation:  Denies any homicidal ideations AEB (as evidenced by):  Psychiatric Specialty Exam: Physical Exam  ROS  Blood pressure 119/79, pulse 105, temperature 97.5 F (36.4 C), temperature source Oral, resp. rate 18, height _0  (1.727 m), weight 94.348 kg (208 lb), SpO2 96.00%.Body mass index is 31.63 kg/(m^2).  General Appearance: better groomed  Eye Contact::  Good  Speech:  Normal Rate  Volume:  Normal  Mood:  Depressed but improving   Affect:  reactive , does smile at times appropriately   Thought Process:  Goal Directed  Orientation:  Full (Time, Place, and Person)  Thought Content:  no current psychotic symptoms  Suicidal Thoughts:  No  Homicidal Thoughts:  No  Memory:  Negative  Judgement:  Fair  Insight:  Fair  Psychomotor Activity:  Normal  Concentration:  Good  Recall:  Good  Fund of Knowledge:Good  Language: Good  Akathisia:  No  Handed:  Right  AIMS (if indicated):     Assets:  Desire for Improvement Resilience   Sleep:  Number of Hours: 3.25   Musculoskeletal: Strength & Muscle Tone: within normal limits Gait & Station: normal Patient leans: N/A  Current Medications: Current Facility-Administered Medications  Medication Dose Route Frequency Provider Last Rate Last Dose  . acetaminophen (TYLENOL) tablet 650 mg  650 mg Oral Q6H PRN Lurena Nida, NP      . alum & mag hydroxide-simeth (MAALOX/MYLANTA) 200-200-20 MG/5ML suspension 30 mL  30 mL Oral Q4H PRN Lurena Nida, NP      . atorvastatin (LIPITOR) tablet 40 mg  40 mg Oral Daily Lurena Nida, NP   40 mg at 07/24/13 0802  . feeding supplement (ENSURE COMPLETE) (ENSURE COMPLETE) liquid 237 mL  237 mL Oral BID BM Toribio Harbour, RD      . glipiZIDE (GLUCOTROL) tablet 10 mg  10 mg Oral QAC breakfast Lurena Nida, NP   10 mg at 07/24/13 0604  . lamoTRIgine (LAMICTAL) tablet 25 mg  25 mg Oral BID Neita Garnet, MD   25 mg at 07/24/13 1710  . lisinopril (PRINIVIL,ZESTRIL) tablet 20 mg  20 mg Oral Daily Lurena Nida, NP   20 mg at 07/24/13 0802  . magnesium hydroxide (MILK OF MAGNESIA) suspension 30 mL  30 mL Oral Daily PRN Lurena Nida, NP      . metFORMIN (GLUCOPHAGE) tablet 1,000 mg  1,000 mg  Oral BID WC Lurena Nida, NP   1,000 mg at 07/24/13 1710  . metoprolol tartrate (LOPRESSOR) tablet 25 mg  25 mg Oral BID Lurena Nida, NP   25 mg at 07/24/13 1710  . traZODone (DESYREL) tablet 150 mg  150 mg Oral QHS PRN Lurena Nida, NP   150 mg at 07/23/13 2144    Lab Results:  Results for orders placed during the hospital encounter of 07/23/13 (from the past 48 hour(s))  GLUCOSE, CAPILLARY     Status: Abnormal   Collection Time    07/23/13  6:24 AM      Result Value Ref Range   Glucose-Capillary 213 (*) 70 - 99 mg/dL  GLUCOSE, CAPILLARY     Status: Abnormal   Collection Time    07/24/13  6:34 AM      Result Value Ref Range   Glucose-Capillary 164 (*) 70 - 99 mg/dL  GLUCOSE, CAPILLARY     Status: Abnormal   Collection Time    07/24/13   4:54 PM      Result Value Ref Range   Glucose-Capillary 146 (*) 70 - 99 mg/dL   Comment 1 Notify RN      Physical Findings: AIMS: Facial and Oral Movements Muscles of Facial Expression: None, normal Lips and Perioral Area: None, normal Jaw: None, normal Tongue: None, normal,Extremity Movements Upper (arms, wrists, hands, fingers): None, normal Lower (legs, knees, ankles, toes): None, normal, Trunk Movements Neck, shoulders, hips: None, normal, Overall Severity Severity of abnormal movements (highest score from questions above): None, normal Incapacitation due to abnormal movements: None, normal Patient's awareness of abnormal movements (rate only patient's report): No Awareness, Dental Status Current problems with teeth and/or dentures?: No Does patient usually wear dentures?: No  CIWA:  CIWA-Ar Total: 0 COWS:  COWS Total Score: 1  Assessment- patient is improving- mood and affect are improving and today affect is reactive. Behavior on unit in good control. Tolerating Lamictal well, with no side effects reported .   Treatment Plan Summary: Daily contact with patient to assess and evaluate symptoms and progress in treatment Medication management continue lamictal, which was increased yesterday. We have reviewed side effects , to include risk of severe rash.   Plan: As above- continue inpatient treatment/milieu, support. Consider discharge soon as he continues to improve  Medical Decision Making Problem Points:  Established problem, stable/improving (1) Data Points:  Review of medication regiment & side effects (2)  I certify that inpatient services furnished can reasonably be expected to improve the patient's condition.   Fernando Cobos 07/24/2013, 7:15 PM

## 2013-07-24 NOTE — B[facility] Suicide Risk Assessment (Signed)
[facility] INPATIENT:  Family/Significant Other Suicide Prevention Education  Suicide Prevention Education:  Patient Refusal for Family/Significant Other Suicide Prevention Education: The patient [name] has refused to provide written consent for family/significant other to be provided Family/Significant Other Suicide Prevention Education during admission and/or prior to discharge.  Physician notified.  Sydna Brodowski Hairston Remie Mathison 07/24/2013, 8:48 AM

## 2013-07-24 NOTE — Progress Notes (Signed)
Adult Psychoeducational Group Note  Date:  07/24/2013 Time:  9:31 PM  Group Topic/Focus:  Wrap-Up Group:   The focus of this group is to help patients review their daily goal of treatment and discuss progress on daily workbooks.  Participation Level:  Did Not Attend  Participation Quality:  Drowsy  Affect:  Flat  Cognitive:  Lacking  Insight: None  Engagement in Group:  None   Modes of Intervention:  None  Additional Comments:  PT decided not to go to group   Harlan Vinal R Lorynn Moeser 07/24/2013, 9:31 PM

## 2013-07-24 NOTE — Progress Notes (Signed)
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

## 2013-07-24 NOTE — Progress Notes (Signed)
Recreation Therapy Notes  Animal-Assisted Activity/Therapy (AAA/T) Program Checklist/Progress Notes Patient Eligibility Criteria Checklist & Daily Group note for Rec Tx Intervention  Date: 06.09.2015 Time: 2:45pm Location: 500 Hall Dayroom    AAA/T Program Assumption of Risk Form signed by Patient/ or Parent Legal Guardian yes  Patient is free of allergies or sever asthma yes  Patient reports no fear of animals yes  Patient reports no history of cruelty to animals yes   Patient understands his/her participation is voluntary yes  Behavioral Response: Did not attend.   Katheline Brendlinger L Sharion Grieves, LRT/CTRS        Lessly Stigler L Keithon Mccoin 07/24/2013 4:10 PM 

## 2013-07-24 NOTE — Progress Notes (Signed)
The focus of this group is to educate the patient on the purpose and policies of crisis stabilization and provide a format to answer questions about their admission.  The group details unit policies and expectations of patients while admitted. Patient attended this group. 

## 2013-07-24 NOTE — BHH Group Notes (Signed)
[place] LCSW Group Therapy      Feelings About Diagnosis 1:15 - 2:30 PM         07/24/2013    Type of Therapy:  Group Therapy  Participation Level:  Active  Participation Quality:  Appropriate  Affect:  Appropriate  Cognitive:  Alert and Appropriate  Insight:  Developing/Improving and Engaged  Engagement in Therapy:  Developing/Improving and Engaged  Modes of Intervention:  Discussion, Education, Exploration, Problem-Solving, Rapport Building, Support  Summary of Progress/Problems:  Patient actively participated in group. Patient discussed past and present diagnosis and the effects it has had on  life.  Patient talked about family and society being judgmental and the stigma associated with having a mental health diagnosis.  He shared he is okay with the diagnosis and knows he can be better if he stays on his medication.  Concha Pyo 07/24/2013

## 2013-07-24 NOTE — Progress Notes (Signed)
D: Pt denies SI/HI/AVH. Pt is pleasant and cooperative. Pt sleep majority of the night. Pt stated everything going fine.  A: Pt was offered support and encouragement. Pt was given scheduled medications. Pt was encourage to attend groups. Q 15 minute checks were done for safety.  R:Pt attends groups and interacts well with peers and staff. Pt is taking medication. Pt has no complaints at this time.Pt receptive to treatment and safety maintained on unit.

## 2013-07-24 NOTE — Progress Notes (Signed)
Pt attended spiritual care group on grief and loss facilitated by chaplain Jerene Pitch. Group opened with brief discussion and psycho-social ed around grief and loss in relationships and in relation to self - identifying life patterns, circumstances, changes that cause losses. Established group norm of speaking from own life experience. Group goal of establishing open and affirming space for members to share loss and experience with grief, normalize grief experience and provide psycho social education and grief support.   [name] was present and attentive through majority of group.  He left group toward the end.   [place]

## 2013-07-25 DIAGNOSIS — F322 Major depressive disorder, single episode, severe without psychotic features: Secondary | ICD-10-CM

## 2013-07-25 LAB — GLUCOSE, CAPILLARY
Glucose-Capillary: 158 mg/dL — ABNORMAL HIGH (ref 70–99)
Glucose-Capillary: 169 mg/dL — ABNORMAL HIGH (ref 70–99)

## 2013-07-25 NOTE — BHH Group Notes (Signed)
[facility] LCSW Aftercare Discharge Planning Group Note   07/25/2013 9:51 AM    Participation Quality:  Appropraite  Mood/Affect:  Appropriate  Depression Rating:  3  Anxiety Rating:  3  Thoughts of Suicide:  No  Will you contract for safety?   NA  Current AVH:  No  Plan for Discharge/Comments:  Patient attended discharge planning group and actively participated in group.  He will follow up with [facility].  CSW provided all participants with daily workbook.   Transportation Means: Patient has transportation.   Supports:  Patient has a support system.   Anoop Hemmer, [name]

## 2013-07-25 NOTE — BHH Group Notes (Signed)
[place] LCSW Group Therapy  Emotional Regulation 1:15 - 2: 30 PM        07/25/2013     Type of Therapy:  Group Therapy  Participation Level:  Appropriate  Participation Quality:  Appropriate  Affect:  Appropriate  Cognitive:  Attentive Appropriate  Insight:  Developing/Improving Engaged  Engagement in Therapy:  Developing/Improving Engaged  Modes of Intervention:  Discussion Exploration Problem-Solving Supportive  Summary of Progress/Problems:  Group topic was emotional regulations.  Patient participated in the discussion and was able to identify an emotion that needed to regulated. Patient shared the emotion he has to regulate is anger.  Patient was able to identify approprite coping skills.  [name] 07/25/2013

## 2013-07-25 NOTE — Progress Notes (Signed)
D: Pt is appropriate in affect and pleasant in mood. Pt reports having depression at a level 2/10 and anxiety at a level of 3/10. Pt denies any SI/HI/AVH. Pt actively participates within the milieu. Pt is currently denying any SI/HI. No reports of any AVH. Pt reports chronic knee pain. Per pt, this pain is bearable and requires no interventions at this time. Pt reports to be having a good day today. Pt attended NA tonight on the 300 hall.   A: Writer administered trazodone for sleep this evening. Continued support and availability as needed was extended to this pt. Staff continue to monitor pt with q44min checks.  R: No adverse drug reactions noted. Pt receptive to treatment. Pt remains safe at this time.

## 2013-07-25 NOTE — Clinical Social Work Note (Signed)
CSW met with patient and members of [facility]'s Transition Team.  Transition team advised patient they can assist patient with transportation to [place] appointments and other services.  After learning about the program, patient accepted services offered.

## 2013-07-25 NOTE — Progress Notes (Signed)
[facility] MD Progress Note  07/25/2013 4:27 PM [name]  MRN:  [id number] Subjective:  Patient reports he is feeling better today and that he has plans to followup with [facility]; states that the social worker got him an earlier appointment than the typical 37-month timeframe he is usually allocated. Pt denies SI, HI, and AVH, contracts for safety and is enthusiastic about his treatment regimen. Pt rates anxiety and depression both at 3/10, stating that this is a major improvement since admission.    Objective- I have met with patient and I have discussed case with Nursing Staff. Patient has remained calm and has participated in  Milieu activities, group therapy sessions. He is reporting improvement, although still feeling depressed. Denies medication side effects.  Diagnosis:   AXIS I: Major Depression, single episode SEVERE  AXIS II: Deferred  AXIS III:  Past Medical History   Diagnosis  Date   .  PANIC DISORDER    .  HYPERTENSION    .  Esophageal reflux    .  GERD (gastroesophageal reflux disease)    .  Osteoarthritis    .  Scoliosis    .  Depression     AXIS IV: occupational problems and problems with primary support group  AXIS V: Moderate Symptoms  DSM5:  Total Time spent with patient: 25 minutes    ADL's:  Intact  Sleep: fair   Appetite:  Good  Suicidal Ideation:  Denies any suicidal ideations  Homicidal Ideation:  Denies any homicidal ideations AEB (as evidenced by):  Psychiatric Specialty Exam: Physical Exam  Review of Systems  Constitutional: Negative.   HENT: Negative.   Eyes: Negative.   Respiratory: Negative.   Cardiovascular: Negative.   Gastrointestinal: Negative.   Genitourinary: Negative.   Musculoskeletal: Negative.   Skin: Negative.   Neurological: Negative.   Endo/Heme/Allergies: Negative.   Psychiatric/Behavioral: Positive for depression. The patient is nervous/anxious.     Blood pressure 112/80, pulse 101, temperature 98.3 F (36.8 C),  temperature source Oral, resp. rate 18, height $RemoveBe'5\' 8"'WuUUEnBQA$  (1.727 m), weight 94.348 kg (208 lb), SpO2 96.00%.Body mass index is 31.63 kg/(m^2).  General Appearance: better groomed  Eye Contact::  Good  Speech:  Normal Rate  Volume:  Normal  Mood:  Euthymic  Affect:  reactive , does smile at times appropriately   Thought Process:  Goal Directed  Orientation:  Full (Time, Place, and Person)  Thought Content:  no current psychotic symptoms  Suicidal Thoughts:  No  Homicidal Thoughts:  No  Memory:  Negative  Judgement:  Fair  Insight:  Fair  Psychomotor Activity:  Normal  Concentration:  Good  Recall:  Good  Fund of Knowledge:Good  Language: Good  Akathisia:  No  Handed:  Right  AIMS (if indicated):     Assets:  Desire for Improvement Resilience  Sleep:  Number of Hours: 6   Musculoskeletal: Strength & Muscle Tone: within normal limits Gait & Station: normal Patient leans: N/A  Current Medications: Current Facility-Administered Medications  Medication Dose Route Frequency Provider Last Rate Last Dose  . acetaminophen (TYLENOL) tablet 650 mg  650 mg Oral Q6H PRN Lurena Nida, NP      . alum & mag hydroxide-simeth (MAALOX/MYLANTA) 200-200-20 MG/5ML suspension 30 mL  30 mL Oral Q4H PRN Lurena Nida, NP      . atorvastatin (LIPITOR) tablet 40 mg  40 mg Oral Daily Lurena Nida, NP   40 mg at 07/25/13 0806  . feeding supplement (ENSURE  COMPLETE) (ENSURE COMPLETE) liquid 237 mL  237 mL Oral BID BM Toribio Harbour, RD      . glipiZIDE (GLUCOTROL) tablet 10 mg  10 mg Oral QAC breakfast Lurena Nida, NP   10 mg at 07/25/13 8119  . lamoTRIgine (LAMICTAL) tablet 25 mg  25 mg Oral BID Neita Garnet, MD   25 mg at 07/25/13 0806  . lisinopril (PRINIVIL,ZESTRIL) tablet 20 mg  20 mg Oral Daily Lurena Nida, NP   20 mg at 07/25/13 0806  . magnesium hydroxide (MILK OF MAGNESIA) suspension 30 mL  30 mL Oral Daily PRN Lurena Nida, NP      . metFORMIN (GLUCOPHAGE) tablet 1,000 mg  1,000 mg Oral  BID WC Lurena Nida, NP   1,000 mg at 07/25/13 0806  . metoprolol tartrate (LOPRESSOR) tablet 25 mg  25 mg Oral BID Lurena Nida, NP   25 mg at 07/25/13 0806  . traZODone (DESYREL) tablet 150 mg  150 mg Oral QHS PRN Lurena Nida, NP   150 mg at 07/24/13 2314    Lab Results:  Results for orders placed during the hospital encounter of 07/23/13 (from the past 48 hour(s))  GLUCOSE, CAPILLARY     Status: Abnormal   Collection Time    07/24/13  6:34 AM      Result Value Ref Range   Glucose-Capillary 164 (*) 70 - 99 mg/dL  GLUCOSE, CAPILLARY     Status: Abnormal   Collection Time    07/24/13  4:54 PM      Result Value Ref Range   Glucose-Capillary 146 (*) 70 - 99 mg/dL   Comment 1 Notify RN    GLUCOSE, CAPILLARY     Status: Abnormal   Collection Time    07/25/13  6:25 AM      Result Value Ref Range   Glucose-Capillary 158 (*) 70 - 99 mg/dL    Physical Findings: AIMS: Facial and Oral Movements Muscles of Facial Expression: None, normal Lips and Perioral Area: None, normal Jaw: None, normal Tongue: None, normal,Extremity Movements Upper (arms, wrists, hands, fingers): None, normal Lower (legs, knees, ankles, toes): None, normal, Trunk Movements Neck, shoulders, hips: None, normal, Overall Severity Severity of abnormal movements (highest score from questions above): None, normal Incapacitation due to abnormal movements: None, normal Patient's awareness of abnormal movements (rate only patient's report): No Awareness, Dental Status Current problems with teeth and/or dentures?: No Does patient usually wear dentures?: No  CIWA:  CIWA-Ar Total: 0 COWS:  COWS Total Score: 1  Assessment- patient is improving- mood and affect are improving and today affect is reactive. Behavior on unit in good control. Tolerating Lamictal well, with no side effects reported .   Treatment Plan Summary: Daily contact with patient to assess and evaluate symptoms and progress in treatment Medication  management continue lamictal, which was increased yesterday. We have reviewed side effects , to include risk of severe rash.   Plan: As above- continue inpatient treatment/milieu, support. Consider discharge soon as he continues to improve  Medical Decision Making Problem Points:  Established problem, stable/improving (1) Data Points:  Review or order clinical lab tests (1) Review or order medicine tests (1) Review of medication regiment & side effects (2) Review of new medications or change in dosage (2)  I certify that inpatient services furnished can reasonably be expected to improve the patient's condition.   Benjamine Mola, FNP-BC 07/25/2013, 4:27 PM Agree with assessment and plan Geralyn Flash A. [place],  M.D. 

## 2013-07-25 NOTE — Progress Notes (Signed)
D: Patient denies SI/HI and A/V hallucinations; patient reports sleep is well; reports appetite is good; reports energy level is normal ; reports ability to pay attention is good; rates depression as 2/10; rates hopelessness 3/10; patient reports that she wants to continue the recovery process  A: Monitored q 15 minutes; patient encouraged to attend groups; patient educated about medications; patient given medications per physician orders; patient encouraged to express feelings and/or concerns  R: Patient is appropriate to circumstances and cooperative; patient is pleasant and appears to be invested in his treatment  ; patient's interaction with staff and peers is appropriate; patient was able to set goal to talk with staff 1:1 when having feelings of SI; patient is taking medications as prescribed and tolerating medications; patient is attending all groups and engaging

## 2013-07-26 ENCOUNTER — Encounter (HOSPITAL_COMMUNITY): Payer: Self-pay

## 2013-07-26 DIAGNOSIS — F322 Major depressive disorder, single episode, severe without psychotic features: Secondary | ICD-10-CM | POA: Diagnosis not present

## 2013-07-26 LAB — GLUCOSE, CAPILLARY
Glucose-Capillary: 214 mg/dL — ABNORMAL HIGH (ref 70–99)
Glucose-Capillary: 225 mg/dL — ABNORMAL HIGH (ref 70–99)

## 2013-07-26 NOTE — Progress Notes (Signed)
D:  Patient's self inventory sheet, patient sleeps well, good appetite, normal energy level, good attention span.  Rated depression and anxiety 3, hopeless 2.  Denied SI.  Has knee problems, pain goal 3, worst pain 3.  Plans to continue recover process.  Does have discharge plans.  No problems with medications after discharge.   A:  Medications administered per MD orders.  Emotional support and encouragement given patient. R:  Denied SI and HI.  Denied A/V hallucinations.  Will continue to monitor patient for safety with 15 minute checks.  Safety maintained.

## 2013-07-26 NOTE — Progress Notes (Signed)
Adult Psychoeducational Group Note  Date:  07/26/2013 Time:  10:00AM  Group Topic/Focus:  Overcoming Stress:   The focus of this group is to define stress and help patients assess their triggers.  Participation Level:  Did Not Attend  Participation Quality:  Pt did not attend  Affect:  Pt did not attend  Cognitive:  Pt did not attend  Insight: Pt did not attend  Engagement in Group: Pt did not attend  Modes of Intervention:  Pt did not attend  Additional Comments:  Pt did not attend  Sherlynn Carbon 07/26/2013, 10:52 AM

## 2013-07-26 NOTE — BHH Group Notes (Signed)
[place] LCSW Group Therapy  [facility] 1:15 - 2:30 PM  07/26/2013   Type of Therapy:  Group Therapy  Participation Level: Active  Participation Quality:  Attentive  Affect:  Appropriate  Cognitive:  Appropriate  Insight:  Developing/Improving and Engaged  Engagement in Therapy:  Developing/Improving Engaged  Modes of Intervention:  Discussion, Education, Exploration, Problem-Solving, Rapport Building, Support   Summary of Progress/Problems:  Patient listened attentively to speaker from [place]. Patient  nodded in agreement with others but did not engage in the discussion.   Concha Pyo 07/26/2013

## 2013-07-26 NOTE — Progress Notes (Signed)
The focus of this group is to educate the patient on the purpose and policies of crisis stabilization and provide a format to answer questions about their admission.  The group details unit policies and expectations of patients while admitted.  Patient attended 0900 nurse education orientation group this morning.  Patient actively participated, appropriate affect, alert, appropriate insight and engagement.  Today patient will work on 3 goals for discharge.  

## 2013-07-26 NOTE — Progress Notes (Signed)
D: Pt is anxious in affect and pleasant in mood. Pt endorses depression and hopelessness at level 2, and anxiety at level 3/10. Pt verbalizes no concerns he wishes for this writer to address at this time. He reports his medication as effective with no adverse effects. He is denying any SI/HI/AVH. Pt actively interacts and participates within the milieu. Pt is looking forward to discharging. Pt plans to follow-up with his discharge plans accordingly.  A: Writer administered Trazodone for pt's insomnia. Continued support and availability as needed was extended to this pt. Staff continue to monitor pt with q41min checks.  R: No adverse drug reactions noted. Pt receptive to treatment. Pt remains safe at this time.

## 2013-07-26 NOTE — Progress Notes (Signed)
Patient ID: [name], male   DOB: Jan 12, 1956, 58 y.o.   MRN: [id number] [facility] MD Progress Note  07/26/2013 4:23 PM [name]  MRN:  [id number] Subjective:  "I'm OK" No issues identified currently .  Objective- I have met with patient and I have discussed case with Education officer, museum. Report is that patient is doing well. He participates in milieu, and his participation in groups is good. Behavior calm, in good control.  He is reporting improvement, and  describes mood as " better"  Denies medication side effects We discussed disposition options- patient plans to follow up at [facility], in [facility]. As per Education officer, museum, he is also connected with Health visitor.  Diagnosis:   AXIS I: Major Depression, single episode SEVERE  AXIS II: Deferred  AXIS III:  Past Medical History   Diagnosis  Date   .  PANIC DISORDER    .  HYPERTENSION    .  Esophageal reflux    .  GERD (gastroesophageal reflux disease)    .  Osteoarthritis    .  Scoliosis    .  Depression     AXIS IV: occupational problems and problems with primary support group  AXIS V: 55-60    Total Time spent with patient: 20 minutes    ADL's:  Improved   Sleep: fair   Appetite:  Good  Suicidal Ideation:  Denies any suicidal ideations  Homicidal Ideation:  Denies any homicidal ideations AEB (as evidenced by):  Psychiatric Specialty Exam: Physical Exam  Review of Systems  Constitutional: Negative for fever and chills.  Respiratory: Negative for shortness of breath.   Cardiovascular: Negative for chest pain.  Genitourinary: Negative for dysuria and urgency.  Musculoskeletal: Negative.        Mobilizes with cane due to chronic knee discomfort  Psychiatric/Behavioral: Positive for depression.       Mood improved    Blood pressure 119/78, pulse 103, temperature 97 F (36.1 C), temperature source Oral, resp. rate 20, height _0  (1.727 m), weight 94.348 kg (208 lb), SpO2 96.00%.Body mass index is  31.63 kg/(m^2).  General Appearance: better groomed  Eye Contact::  Good  Speech:  Normal Rate  Volume:  Normal  Mood:  Improved    Affect:  Reactive, pleasant   Thought Process:  Goal Directed  Orientation:  Full (Time, Place, and Person)  Thought Content:  no current psychotic symptoms  Suicidal Thoughts:  No- denies any SI at this time  Homicidal Thoughts:  No  Memory:  Negative  Judgement:  Fair  Insight:  Fair  Psychomotor Activity:  Normal  Concentration:  Good  Recall:  Good  Fund of Knowledge:Good  Language: Good  Akathisia:  No  Handed:  Right  AIMS (if indicated):     Assets:  Desire for Improvement Resilience  Sleep:  Number of Hours: 4.5   Musculoskeletal: Strength & Muscle Tone: within normal limits Gait & Station: normal Patient leans: N/A  Current Medications: Current Facility-Administered Medications  Medication Dose Route Frequency Provider Last Rate Last Dose  . acetaminophen (TYLENOL) tablet 650 mg  650 mg Oral Q6H PRN Lurena Nida, NP      . alum & mag hydroxide-simeth (MAALOX/MYLANTA) 200-200-20 MG/5ML suspension 30 mL  30 mL Oral Q4H PRN Lurena Nida, NP      . atorvastatin (LIPITOR) tablet 40 mg  40 mg Oral Daily Lurena Nida, NP   40 mg at 07/26/13 0810  . feeding supplement (ENSURE  COMPLETE) (ENSURE COMPLETE) liquid 237 mL  237 mL Oral BID BM Toribio Harbour, RD      . glipiZIDE (GLUCOTROL) tablet 10 mg  10 mg Oral QAC breakfast Lurena Nida, NP   10 mg at 07/26/13 6314  . lamoTRIgine (LAMICTAL) tablet 25 mg  25 mg Oral BID Neita Garnet, MD   25 mg at 07/26/13 0810  . lisinopril (PRINIVIL,ZESTRIL) tablet 20 mg  20 mg Oral Daily Lurena Nida, NP   20 mg at 07/26/13 0810  . magnesium hydroxide (MILK OF MAGNESIA) suspension 30 mL  30 mL Oral Daily PRN Lurena Nida, NP      . metFORMIN (GLUCOPHAGE) tablet 1,000 mg  1,000 mg Oral BID WC Lurena Nida, NP   1,000 mg at 07/26/13 0811  . metoprolol tartrate (LOPRESSOR) tablet 25 mg  25 mg Oral  BID Lurena Nida, NP   25 mg at 07/26/13 0811  . traZODone (DESYREL) tablet 150 mg  150 mg Oral QHS PRN Lurena Nida, NP   150 mg at 07/25/13 2147    Lab Results:  Results for orders placed during the hospital encounter of 07/23/13 (from the past 48 hour(s))  GLUCOSE, CAPILLARY     Status: Abnormal   Collection Time    07/24/13  4:54 PM      Result Value Ref Range   Glucose-Capillary 146 (*) 70 - 99 mg/dL   Comment 1 Notify RN    GLUCOSE, CAPILLARY     Status: Abnormal   Collection Time    07/25/13  6:25 AM      Result Value Ref Range   Glucose-Capillary 158 (*) 70 - 99 mg/dL  GLUCOSE, CAPILLARY     Status: Abnormal   Collection Time    07/25/13  4:56 PM      Result Value Ref Range   Glucose-Capillary 169 (*) 70 - 99 mg/dL  GLUCOSE, CAPILLARY     Status: Abnormal   Collection Time    07/26/13  6:19 AM      Result Value Ref Range   Glucose-Capillary 214 (*) 70 - 99 mg/dL    Physical Findings: AIMS: Facial and Oral Movements Muscles of Facial Expression: None, normal Lips and Perioral Area: None, normal Jaw: None, normal Tongue: None, normal,Extremity Movements Upper (arms, wrists, hands, fingers): None, normal Lower (legs, knees, ankles, toes): None, normal, Trunk Movements Neck, shoulders, hips: None, normal, Overall Severity Severity of abnormal movements (highest score from questions above): None, normal Incapacitation due to abnormal movements: None, normal Patient's awareness of abnormal movements (rate only patient's report): No Awareness, Dental Status Current problems with teeth and/or dentures?: No Does patient usually wear dentures?: No  CIWA:  CIWA-Ar Total: 1 COWS:  COWS Total Score: 3  Assessment- patient is stabilizing- behavior in good control, tolerating Lamictal well. We have reviewed side effects. .   Treatment Plan Summary: Daily contact with patient to assess and evaluate symptoms and progress in treatment Medication management continue  lamictal, which was increased yesterday. We have reviewed side effects , to include risk of severe rash.   Plan: As above- continue inpatient treatment/milieu/ discharge tomorrow if he continues to improve with a plan of going to [facility] for ongoing outpatient care.  Continue Lamictal at current dose.   Medical Decision Making Problem Points:  Established problem, stable/improving (1) Data Points:  Review of medication regiment & side effects (2)  I certify that inpatient services furnished can reasonably be  expected to improve the patient's condition.   COBOS, [place] 07/26/2013, 4:23 PM

## 2013-07-27 DIAGNOSIS — F3130 Bipolar disorder, current episode depressed, mild or moderate severity, unspecified: Secondary | ICD-10-CM | POA: Diagnosis not present

## 2013-07-27 LAB — GLUCOSE, CAPILLARY: Glucose-Capillary: 190 mg/dL — ABNORMAL HIGH (ref 70–99)

## 2013-07-27 MED ORDER — LISINOPRIL 20 MG PO TABS: 20.0000 mg | ORAL_TABLET | Freq: Every day | ORAL | Status: DC

## 2013-07-27 MED ORDER — TRAZODONE HCL 150 MG PO TABS: 150.0000 mg | ORAL_TABLET | Freq: Every evening | ORAL | Status: DC | PRN

## 2013-07-27 MED ORDER — METOPROLOL TARTRATE 50 MG PO TABS: 25.0000 mg | ORAL_TABLET | Freq: Two times a day (BID) | ORAL | Status: DC

## 2013-07-27 MED ORDER — GLIPIZIDE 10 MG PO TABS: 10.0000 mg | ORAL_TABLET | Freq: Every day | ORAL | Status: DC

## 2013-07-27 MED ORDER — LAMOTRIGINE 25 MG PO TABS: 25.0000 mg | ORAL_TABLET | Freq: Two times a day (BID) | ORAL | Status: DC

## 2013-07-27 MED ORDER — ATORVASTATIN CALCIUM 40 MG PO TABS: 40.0000 mg | ORAL_TABLET | Freq: Every day | ORAL | Status: DC
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
## 2013-07-27 NOTE — Progress Notes (Addendum)
D:  Patient's self inventory sheet, patient sleeps well, good appetite, normal energy level, good attention span.  Rated depression and hopeless #2, anxiety #3.  Denied withdrawals.  Denied SI.  Continues to have knee pain, worst pain #7 and pain goal 4.  Plans to continue recovery process.  Does have discharge plans.  Needs financial assistance for medications. A:  Medications administered per MD orders.  Emotional support and encouragement given patient. R:  Denied SI and HI, contracts for safety.  Denied A/V hallucinations.  Will continue to monitor patient for safety with 15 minute checks. Safety maintained. Patient stated after he had BM this morning, there were several drops of blood on toilet paper.  Will discuss with MD.   Continues to ambulate with cane.

## 2013-07-27 NOTE — BHH Group Notes (Signed)
[facility] LCSW Aftercare Discharge Planning Group Note   07/27/2013 9:50 AM    Participation Quality:  Appropraite  Mood/Affect:  Appropriate  Depression Rating:  0  Anxiety Rating:  0  Thoughts of Suicide:  No  Will you contract for safety?   NA  Current AVH:  No  Plan for Discharge/Comments:  Patient attended discharge planning group and actively participated in group.  He reports doing well and being ready for discharge.  He will follow up with [facility].  CSW provided all participants with daily workbook.   Transportation Means: Patient has transportation.   Supports:  Patient has a support system.   [name], [name]

## 2013-07-27 NOTE — Progress Notes (Signed)
Nrsg DC   MD completes DC order as well as DC SRA. Pt completes DC assessment, deneis SI wnad says " I am ready to go now this time". He deneis SI within the past 24 hrs and says he understands DC AVS instructions and that he wil keep f/u appts. ALl belongings are returned to himas well as his money ( locked in safe) and he signs release per policy. HE is given DC AVS and prescriptions as well as samples of emds from pharmacy and then he is taken to lobby where [facility] personnel are waiting to take him to f/u care.

## 2013-07-27 NOTE — Progress Notes (Signed)
[facility] Adult Case Management Discharge Plan :  Will you be returning to the same living situation after discharge: Yes,  Patient will return to his home. At discharge, do you have transportation home?:Yes,  Patient will be transported by [facility]. Do you have the ability to pay for your medications:Yes,  Patient has Medicaid.  Release of information consent forms completed and in the chart;  Patient's signature needed at discharge.  Patient to Follow up at: Follow-up Information   Follow up with [name] [place] On 07/30/2013. (Monday, July 30, 2013 at [facility])    Contact information:   [address] [place], [place]   [address]  [phone]      Patient denies SI/HI:  Patient no longer endorsing SI/HI or other thoughts of self harm.   Safety Planning and Suicide Prevention discussed:  .Reviewed with all patients during discharge planning group   Franki Stemen, [name] 07/27/2013, 10:41 AM

## 2013-07-27 NOTE — Tx Team (Signed)
Interdisciplinary Treatment Plan Update   Date Reviewed:  07/27/2013  Time Reviewed:  8:38 AM  Progress in Treatment:   Attending groups: Yes Participating in groups: Yes Taking medication as prescribed: Yes  Tolerating medication: Yes Family/Significant other contact made:  No, but will ask patient for consent for collateral contact Patient understands diagnosis: Yes  Discussing patient identified problems/goals with staff: Yes Medical problems stabilized or resolved: Yes Denies suicidal/homicidal ideation: Yes Patient has not harmed self or others: Yes  For review of initial/current patient goals, please see plan of care.  Estimated Length of Stay:  Discharge today  Reasons for Continued Hospitalization:   New Problems/Goals identified:    Discharge Plan or Barriers:   Home with outpatient follow up with [facility]  Additional Comments:    Attendees:  Patient:  07/27/2013 8:38 AM   Signature:  Gabriel Earing, MD 07/27/2013 8:38 AM  Signature:  07/27/2013 8:38 AM  Signature:   Eduard Roux, RN 07/27/2013 8:38 AM  Signature:  Grayland Ormond, RN 07/27/2013 8:38 AM  Signature  Talbert Cage, RN 07/27/2013 8:38 AM  Signature:  Joette Catching, LCSW 07/27/2013 8:38 AM  Signature:  Regan Lemming, LCSW 07/27/2013 8:38 AM  Signature:  Lucinda Dell, Care Coordinator [facility] 07/27/2013 8:38 AM  Signature: 07/27/2013 8:38 AM  Signature:  07/27/2013  8:38 AM  Signature:   Lars Pinks, RN [facility] 07/27/2013  8:38 AM  Signature: 07/27/2013  8:38 AM    Scribe for Treatment Team:   Joette Catching,  07/27/2013 8:38 AM
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
## 2013-07-31 NOTE — Progress Notes (Signed)
Patient Discharge Instructions:  After Visit Summary (AVS):   Faxed to:  07/31/13 Discharge Summary Note:   Faxed to:  07/31/13 Psychiatric Admission Assessment Note:   Faxed to:  07/31/13 Suicide Risk Assessment - Discharge Assessment:   Faxed to:  07/31/13 Faxed/Sent to the Next Level Care provider:  07/31/13 Faxed to [place] @ [phone]  Patsey Berthold, 07/31/2013, 2:57 PM

## 2015-01-01 ENCOUNTER — Ambulatory Visit (INDEPENDENT_AMBULATORY_CARE_PROVIDER_SITE_OTHER): Payer: Medicaid Other | Admitting: Student

## 2015-01-01 ENCOUNTER — Encounter: Payer: Self-pay | Admitting: Student

## 2015-01-01 VITALS — BP 120/70 | Ht 68.0 in | Wt 216.0 lb

## 2015-01-01 DIAGNOSIS — Z0000 Encounter for general adult medical examination without abnormal findings: Secondary | ICD-10-CM

## 2015-01-01 DIAGNOSIS — Z1211 Encounter for screening for malignant neoplasm of colon: Secondary | ICD-10-CM

## 2015-01-01 DIAGNOSIS — E119 Type 2 diabetes mellitus without complications: Secondary | ICD-10-CM | POA: Insufficient documentation

## 2015-01-01 DIAGNOSIS — Z7181 Spiritual or religious counseling: Secondary | ICD-10-CM | POA: Insufficient documentation

## 2015-01-01 DIAGNOSIS — N529 Male erectile dysfunction, unspecified: Secondary | ICD-10-CM | POA: Insufficient documentation

## 2015-01-01 MED ORDER — PRECISION XTRA MONITOR DEVI: 1.0000 | Freq: Every day | Status: DC

## 2015-01-01 MED ORDER — METFORMIN HCL 1000 MG PO TABS: 1000.0000 mg | ORAL_TABLET | Freq: Two times a day (BID) | ORAL | Status: DC

## 2015-01-01 MED ORDER — SILDENAFIL CITRATE 100 MG PO TABS: 50.0000 mg | ORAL_TABLET | Freq: Every day | ORAL | Status: DC | PRN

## 2015-01-01 NOTE — Assessment & Plan Note (Signed)
Viagra refilled

## 2015-01-01 NOTE — Assessment & Plan Note (Signed)
Colonoscopy referral today
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
## 2015-01-01 NOTE — Assessment & Plan Note (Signed)
Managed by [facility] PCP, but desires refill of metformin and new glucometer - Metformin refilled - Rx for glucometer given

## 2015-01-01 NOTE — Assessment & Plan Note (Signed)
Stable, will continue to monitor.

## 2015-01-01 NOTE — Patient Instructions (Addendum)
Return in 6 months You were given a prescription for metformin 1000 mg and a precision glucose monitoring device Please call with questions or concerns You will be called by the GI office for your colonoscopy

## 2015-01-02 ENCOUNTER — Telehealth: Payer: Self-pay | Admitting: *Deleted

## 2015-01-02 MED ORDER — SILDENAFIL CITRATE 25 MG PO TABS: 25.0000 mg | ORAL_TABLET | Freq: Every day | ORAL | Status: DC | PRN

## 2015-01-02 NOTE — Telephone Encounter (Signed)
Clemmons from [employer] is calling.  Wants to request changing Viagra from 100mg  to 20mg  as this is cheaper for the patient.  Will forward to MD for OK. Ahria Slappey, Salome Spotted

## 2015-01-02 NOTE — Telephone Encounter (Signed)
Viagra 25 mg ordered

## 2015-01-06 ENCOUNTER — Telehealth: Payer: Self-pay

## 2015-01-06 ENCOUNTER — Telehealth: Payer: Self-pay | Admitting: Student

## 2015-01-06 MED ORDER — BENZONATATE 100 MG PO CAPS: 100.0000 mg | ORAL_CAPSULE | Freq: Two times a day (BID) | ORAL | Status: DC | PRN

## 2015-01-06 MED ORDER — ACCU-CHEK AVIVA PLUS W/DEVICE KIT: 1.0000 | PACK | Freq: Once | Status: DC

## 2015-01-06 NOTE — Telephone Encounter (Signed)
accu-chek aviva plus with device kit ordered

## 2015-01-06 NOTE — Telephone Encounter (Signed)
Patient states he thinks he has the flu/cold and would like "cold meds" called in the pharmacy. I advised patient that he would most likely need to be seen before any meds could be prescribed but he insisted that meds could just me called in. Please advise.

## 2015-01-06 NOTE — Telephone Encounter (Signed)
Will forward to PCP for review of this request. Eldra Word, CMA.

## 2015-01-06 NOTE — Telephone Encounter (Signed)
Pt called regarding request for flu medicine, cough for 1 day, runny nose, some feels decreased appetite, reports chills but denies fever, SOB or vomiting. He became adamant that he has the flu and demanded "treatment for the flu". He then became aggitated when he was told that we could not treat the flu over the phone and if he was very symptomatic he should be seen in the clinic. After reassurance and counseling that he should present to clinic for worsening symptoms or fever he was prescribed tessalon pearls for cough

## 2015-01-07 ENCOUNTER — Telehealth: Payer: Self-pay | Admitting: *Deleted

## 2015-01-07 NOTE — Telephone Encounter (Signed)
Received Prior Auth from [employer] for Viagra, however, Viagra is not covered by Medicaid. Viagra is not listed on Medicaid Formulary. Please advise. L.Sherral Dirocco, RN

## 2015-03-20 ENCOUNTER — Other Ambulatory Visit: Payer: Self-pay | Admitting: Student

## 2015-03-21 ENCOUNTER — Other Ambulatory Visit: Payer: Self-pay | Admitting: *Deleted

## 2015-03-21 MED ORDER — BENZONATATE 100 MG PO CAPS: 100.0000 mg | ORAL_CAPSULE | Freq: Two times a day (BID) | ORAL | Status: DC | PRN

## 2015-04-08 ENCOUNTER — Telehealth: Payer: Self-pay | Admitting: Gastroenterology

## 2015-04-08 NOTE — Telephone Encounter (Signed)
Dr. Armbruster reviewed records and has accepted patient. Ok to schedule Direct Colon. Left message for patient to return my call °

## 2015-04-23 NOTE — Telephone Encounter (Signed)
I have not been able to contact patient. Records will be in "records reviewed" folder

## 2016-01-06 ENCOUNTER — Other Ambulatory Visit: Payer: Self-pay | Admitting: Student

## 2016-01-07 ENCOUNTER — Telehealth: Payer: Self-pay | Admitting: *Deleted

## 2016-01-07 ENCOUNTER — Other Ambulatory Visit: Payer: Self-pay | Admitting: Student

## 2016-01-07 MED ORDER — SILDENAFIL CITRATE 25 MG PO TABS: 25.0000 mg | ORAL_TABLET | Freq: Every day | ORAL | 2 refills | Status: DC | PRN

## 2016-01-07 NOTE — Telephone Encounter (Signed)
Prior Authorization received from [place] for Viagra 25 mg. Viagra is not covered for erectile dysfunction under Medicaid. Derl Barrow, RN

## 2016-05-07 ENCOUNTER — Encounter (HOSPITAL_COMMUNITY): Payer: Self-pay | Admitting: *Deleted

## 2016-05-07 ENCOUNTER — Emergency Department (HOSPITAL_COMMUNITY)
Admission: EM | Admit: 2016-05-07 | Discharge: 2016-05-07 | Disposition: A | Discharge status: Home / Self Care | Payer: Medicaid Other | Attending: Emergency Medicine | Admitting: Emergency Medicine

## 2016-05-07 DIAGNOSIS — Y939 Activity, unspecified: Secondary | ICD-10-CM | POA: Insufficient documentation

## 2016-05-07 DIAGNOSIS — E119 Type 2 diabetes mellitus without complications: Secondary | ICD-10-CM | POA: Insufficient documentation

## 2016-05-07 DIAGNOSIS — Y929 Unspecified place or not applicable: Secondary | ICD-10-CM | POA: Diagnosis not present

## 2016-05-07 DIAGNOSIS — Z7984 Long term (current) use of oral hypoglycemic drugs: Secondary | ICD-10-CM | POA: Insufficient documentation

## 2016-05-07 DIAGNOSIS — Y999 Unspecified external cause status: Secondary | ICD-10-CM | POA: Diagnosis not present

## 2016-05-07 DIAGNOSIS — I10 Essential (primary) hypertension: Secondary | ICD-10-CM | POA: Diagnosis not present

## 2016-05-07 DIAGNOSIS — Z85528 Personal history of other malignant neoplasm of kidney: Secondary | ICD-10-CM | POA: Insufficient documentation

## 2016-05-07 DIAGNOSIS — F17210 Nicotine dependence, cigarettes, uncomplicated: Secondary | ICD-10-CM | POA: Diagnosis not present

## 2016-05-07 DIAGNOSIS — T161XXA Foreign body in right ear, initial encounter: Secondary | ICD-10-CM | POA: Diagnosis present

## 2016-05-07 DIAGNOSIS — X58XXXA Exposure to other specified factors, initial encounter: Secondary | ICD-10-CM | POA: Insufficient documentation

## 2016-05-07 NOTE — Discharge Instructions (Signed)
Follow-up with the primary care provider's office at the low as needed. Return to the emergency department if symptoms worsen or new onset of fever, ear pain, swelling around year, ear drainage, loss of hearing.

## 2016-05-07 NOTE — ED Notes (Signed)
This RN used tweezers from suture removal kit & removed visible black ear bud from R ear, pt tolerated well, PA aware

## 2016-05-07 NOTE — ED Triage Notes (Signed)
Pt reports having an music ear bud stuck in right ear in and unable to get it out.

## 2016-05-07 NOTE — ED Provider Notes (Signed)
[facility] DEPT Provider Note   CSN: [id number] Arrival date & time: 05/07/16  1211 By signing my name below, I, Georgette Shell, attest that this documentation has been prepared under the direction and in the presence of Harlene Ramus, [place]. Electronically Signed: Georgette Shell, ED Scribe. 05/07/16. 1:02 PM.  History   Chief Complaint Chief Complaint  Patient presents with  . Foreign Body in [place]    HPI The history is provided by the patient. No language interpreter was used.   HPI Comments: [name] is a 61 y.o. male with h/o cancer, HLD, and HTN, who presents to the Emergency Department complaining of sensation of foreign body in his right ear occurring just PTA. Pt also has mild hearing loss to his right ear. Pt thinks he has an ear bud stuck in his right ear. No treatments tried PTA. Pt denies fever, chills, ear pain, ear discharge, any pain. Of note: prior to being interviewed, ear bud was removed by nurse. Pt's hearing in right ear is normal. He is in no pain. Pt has no other complaints at this time.  Past Medical History:  Diagnosis Date  . Arthritis   . Cancer ([place])   . Depression   . Diabetes mellitus   . GERD (gastroesophageal reflux disease)   . H/O suicide attempt 1997 and 2011   jumped out of moving vehicle and attempted to jump into moving traffic  . High cholesterol   . Hypertension   . Neuropathic pain     Patient Active Problem List   Diagnosis Date Noted  . Diabetes type 2, controlled ([place]) 01/01/2015  . Erectile dysfunction 01/01/2015  . Healthcare maintenance 01/01/2015  . Bipolar 1 disorder, depressed ([place]) 07/23/2013  . Bipolar disorder ([place]) 04/03/2013  . MDD (major depressive disorder), recurrent, severe, with psychosis ([place]) 11/04/2012  . Suicidal ideation 11/04/2012  . Post traumatic stress disorder (PTSD) 11/04/2012  . CARCINOMA, RENAL CELL 06/05/2008  . HYPERLIPIDEMIA 06/05/2008  . HYPERTENSION 06/05/2008  . CHEST PAIN 06/05/2008     Past Surgical History:  Procedure Laterality Date  . NEPHRECTOMY     due to cancer       Home Medications    Prior to Admission medications   Medication Sig Start Date End Date Taking? Authorizing Provider  atorvastatin (LIPITOR) 40 MG tablet Take 1 tablet (40 mg total) by mouth daily. 07/27/13   Niel Hummer, NP  benzonatate (TESSALON) 100 MG capsule Take 1 capsule (100 mg total) by mouth 2 (two) times daily as needed for cough. 03/21/15   Veatrice Bourbon, MD  Blood Glucose Monitoring Suppl (ACCU-CHEK AVIVA PLUS) W/DEVICE KIT 1 Device by Does not apply route once. 01/06/15   Veatrice Bourbon, MD  Blood Glucose Monitoring Suppl (PRECISION XTRA MONITOR) DEVI 1 Device by Does not apply route daily. 01/01/15   Veatrice Bourbon, MD  glipiZIDE (GLUCOTROL) 10 MG tablet Take 1 tablet (10 mg total) by mouth daily before breakfast. 07/27/13   Niel Hummer, NP  lamoTRIgine (LAMICTAL) 25 MG tablet Take 1 tablet (25 mg total) by mouth 2 (two) times daily. 07/27/13   Niel Hummer, NP  lisinopril (PRINIVIL,ZESTRIL) 20 MG tablet Take 1 tablet (20 mg total) by mouth daily. 07/27/13   Niel Hummer, NP  metFORMIN (GLUCOPHAGE) 1000 MG tablet TAKE ONE TABLET BY MOUTH TWICE DAILY WITH MEAL 03/21/15   Veatrice Bourbon, MD  metoprolol (LOPRESSOR) 50 MG tablet Take 0.5 tablets (25 mg total) by mouth 2 (two)  times daily. 07/27/13   Niel Hummer, NP  sildenafil (REVATIO) 20 MG tablet TAKE 1 TO 3 TABLETS BY MOUTH EVERY DAY AS NEEDED erectile dysfunction 02/18/16   Veatrice Bourbon, MD  sildenafil (VIAGRA) 25 MG tablet Take 1 tablet (25 mg total) by mouth daily as needed for erectile dysfunction. 01/07/16   Veatrice Bourbon, MD  traZODone (DESYREL) 150 MG tablet Take 1 tablet (150 mg total) by mouth at bedtime as needed for sleep. 07/27/13   Niel Hummer, NP    Family History History reviewed. No pertinent family history.  Social History Social History  Substance Use Topics  . Smoking status: Current Every Day Smoker     Packs/day: 0.50    Years: 20.00    Types: Cigarettes  . Smokeless tobacco: Not on file  . 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  HENT: Positive for hearing loss. Negative for ear discharge and ear pain.   All other systems reviewed and are negative.    Physical Exam Updated Vital Signs BP 137/75 (BP Location: Left Arm)   Pulse (!) 107   Temp 98.3 F (36.8 C) (Oral)   Resp 18   Ht '5\' 10"'$  (1.778 m)   Wt 97.1 kg   SpO2 99%   BMI 30.71 kg/m   Physical Exam  Constitutional: He is oriented to person, place, and time. He appears well-developed and well-nourished.  HENT:  Head: Normocephalic and atraumatic.  Right Ear: Hearing, tympanic membrane, external ear and ear canal normal.  Left Ear: Hearing, tympanic membrane, external ear and ear canal normal.  Eyes: Conjunctivae and EOM are normal. Right eye exhibits no discharge. Left eye exhibits no discharge. No scleral icterus.  Neck: Normal range of motion. Neck supple.  Cardiovascular: Normal rate.   Pulmonary/Chest: Effort normal. No respiratory distress.  Abdominal: He exhibits no distension.  Musculoskeletal: Normal range of motion.  Neurological: He is alert and oriented to person, place, and time.  Skin: Skin is warm and dry.  Psychiatric: He has a normal mood and affect. His behavior is normal.  Nursing note and vitals reviewed.    ED Treatments / Results  DIAGNOSTIC STUDIES: Oxygen Saturation is 96% on RA, nomal by my interpretation.    COORDINATION OF CARE: 12:56 PM Discussed treatment plan with pt at bedside and pt agreed to plan.  Labs (all labs ordered are listed, but only abnormal results are displayed) Labs Reviewed - No data to display  EKG  EKG Interpretation None       Radiology No results found.  Procedures .Foreign Body Removal Date/Time:  05/07/2016 4:01 PM Performed by: Nona Dell Authorized by: Nona Dell  Consent: Verbal consent obtained. Risks and benefits: risks, benefits and alternatives were discussed Consent given by: patient Body area: ear Location details: right ear Localization method: visualized Removal mechanism: forceps Complexity: simple 1 objects recovered. Objects recovered: earbud from headphone Post-procedure assessment: foreign body removed Patient tolerance: Patient tolerated the procedure well with no immediate complications Comments: Foreign body was removed by nurse prior to my initial evaluation   (including critical care time)  Medications Ordered in ED Medications - No data to display   Initial Impression / Assessment and Plan / ED Course  I have reviewed the triage vital signs and the nursing  notes.  Pertinent labs & imaging results that were available during my care of the patient were reviewed by me and considered in my medical decision making (see chart for details).     Patient presents with foreign body in his right ear. He reports the rubber end of his earbud became stuck in his right ear when he tried to remove his headphones earlier today. Patient denies any associated symptoms. I was notified by nurse that she was able to visualize rubber end of the earbud at external canal and was able to remove earbud using forceps without any palpitations. On my initial valuation patient denied any pain or complaints at this time. Bilateral TMs clear. Remaining exam unremarkable. Patient discharged home.  Final Clinical Impressions(s) / ED Diagnoses   Final diagnoses:  Foreign body of right ear, initial encounter   Prior to initial evaluation, ear bud was removed by nurse.   New Prescriptions Discharge Medication List as of 05/07/2016 12:59 PM     I personally performed the services described in this documentation, which was scribed in my presence. The  recorded information has been reviewed and is accurate.     Chesley Noon [place], PA-C 05/07/16 [place], DO 05/07/16 [phone]

## 2016-10-08 ENCOUNTER — Encounter (HOSPITAL_COMMUNITY): Payer: Self-pay | Admitting: *Deleted

## 2016-10-08 ENCOUNTER — Emergency Department (HOSPITAL_COMMUNITY)
Admission: EM | Admit: 2016-10-08 | Discharge: 2016-10-09 | Disposition: A | Discharge status: Other Acute Inpatient Hospital | Payer: Medicaid Other | Attending: Emergency Medicine | Admitting: Emergency Medicine

## 2016-10-08 DIAGNOSIS — Z7984 Long term (current) use of oral hypoglycemic drugs: Secondary | ICD-10-CM | POA: Diagnosis not present

## 2016-10-08 DIAGNOSIS — F17210 Nicotine dependence, cigarettes, uncomplicated: Secondary | ICD-10-CM | POA: Diagnosis not present

## 2016-10-08 DIAGNOSIS — Z008 Encounter for other general examination: Secondary | ICD-10-CM | POA: Insufficient documentation

## 2016-10-08 DIAGNOSIS — Z85528 Personal history of other malignant neoplasm of kidney: Secondary | ICD-10-CM | POA: Insufficient documentation

## 2016-10-08 DIAGNOSIS — G479 Sleep disorder, unspecified: Secondary | ICD-10-CM | POA: Insufficient documentation

## 2016-10-08 DIAGNOSIS — R441 Visual hallucinations: Secondary | ICD-10-CM

## 2016-10-08 DIAGNOSIS — E1165 Type 2 diabetes mellitus with hyperglycemia: Secondary | ICD-10-CM | POA: Diagnosis not present

## 2016-10-08 DIAGNOSIS — I10 Essential (primary) hypertension: Secondary | ICD-10-CM | POA: Insufficient documentation

## 2016-10-08 DIAGNOSIS — R45851 Suicidal ideations: Secondary | ICD-10-CM

## 2016-10-08 DIAGNOSIS — Z720 Tobacco use: Secondary | ICD-10-CM

## 2016-10-08 LAB — CBC
HEMATOCRIT: 43.2 % (ref 39.0–52.0)
HEMOGLOBIN: 13.9 g/dL (ref 13.0–17.0)
MCH: 26.6 pg (ref 26.0–34.0)
MCHC: 32.2 g/dL (ref 30.0–36.0)
MCV: 82.8 fL (ref 78.0–100.0)
Platelets: 195 10*3/uL (ref 150–400)
RBC: 5.22 MIL/uL (ref 4.22–5.81)
RDW: 14.6 % (ref 11.5–15.5)
WBC: 5.1 10*3/uL (ref 4.0–10.5)

## 2016-10-08 LAB — COMPREHENSIVE METABOLIC PANEL
ALBUMIN: 4.1 g/dL (ref 3.5–5.0)
ALK PHOS: 67 U/L (ref 38–126)
ALT: 13 U/L — ABNORMAL LOW (ref 17–63)
AST: 20 U/L (ref 15–41)
Anion gap: 12 (ref 5–15)
BUN: 13 mg/dL (ref 6–20)
CALCIUM: 9.4 mg/dL (ref 8.9–10.3)
CO2: 23 mmol/L (ref 22–32)
Chloride: 107 mmol/L (ref 101–111)
Creatinine, Ser: 1.22 mg/dL (ref 0.61–1.24)
GFR calc Af Amer: >60 mL/min (ref >60)
GFR calc non Af Amer: >60 mL/min (ref >60)
GLUCOSE: 198 mg/dL — AB (ref 65–99)
Potassium: 4 mmol/L (ref 3.5–5.1)
SODIUM: 142 mmol/L (ref 135–145)
Total Bilirubin: 0.5 mg/dL (ref 0.3–1.2)
Total Protein: 7.1 g/dL (ref 6.5–8.1)

## 2016-10-08 LAB — RAPID URINE DRUG SCREEN, HOSP PERFORMED
AMPHETAMINES: NOT DETECTED
BARBITURATES: NOT DETECTED
BENZODIAZEPINES: NOT DETECTED
COCAINE: NOT DETECTED
Opiates: NOT DETECTED
TETRAHYDROCANNABINOL: NOT DETECTED

## 2016-10-08 LAB — ACETAMINOPHEN LEVEL

## 2016-10-08 LAB — ETHANOL: Alcohol, Ethyl (B): <5 mg/dL (ref <5)

## 2016-10-08 LAB — SALICYLATE LEVEL: Salicylate Lvl: <7 mg/dL (ref 2.8–30.0)

## 2016-10-08 MED ORDER — GLIPIZIDE 10 MG PO TABS
10.0000 mg | ORAL_TABLET | Freq: Every day | ORAL | Status: DC
  Administered 2016-10-09: 10 mg via ORAL
  Filled 2016-10-08: qty 1

## 2016-10-08 MED ORDER — METFORMIN HCL 500 MG PO TABS
1000.0000 mg | ORAL_TABLET | Freq: Two times a day (BID) | ORAL | Status: DC
  Administered 2016-10-08 – 2016-10-09 (×2): 1000 mg via ORAL
  Filled 2016-10-08 (×2): qty 2

## 2016-10-08 MED ORDER — METOPROLOL TARTRATE 25 MG PO TABS
25.0000 mg | ORAL_TABLET | Freq: Two times a day (BID) | ORAL | Status: DC
  Administered 2016-10-08 – 2016-10-09 (×2): 25 mg via ORAL
  Filled 2016-10-08 (×2): qty 1

## 2016-10-08 MED ORDER — ONDANSETRON HCL 4 MG PO TABS: 4.0000 mg | ORAL_TABLET | Freq: Three times a day (TID) | ORAL | Status: DC | PRN

## 2016-10-08 MED ORDER — NICOTINE 21 MG/24HR TD PT24
21.0000 mg | MEDICATED_PATCH | Freq: Every day | TRANSDERMAL | Status: DC
  Administered 2016-10-08 – 2016-10-09 (×2): 21 mg via TRANSDERMAL
  Filled 2016-10-08 (×2): qty 1

## 2016-10-08 MED ORDER — LISINOPRIL 20 MG PO TABS
20.0000 mg | ORAL_TABLET | Freq: Every day | ORAL | Status: DC
  Administered 2016-10-09: 20 mg via ORAL
  Filled 2016-10-08: qty 1

## 2016-10-08 MED ORDER — LAMOTRIGINE 25 MG PO TABS
25.0000 mg | ORAL_TABLET | Freq: Two times a day (BID) | ORAL | Status: DC
  Administered 2016-10-08 – 2016-10-09 (×2): 25 mg via ORAL
  Filled 2016-10-08 (×2): qty 1

## 2016-10-08 MED ORDER — ZOLPIDEM TARTRATE 5 MG PO TABS: 5.0000 mg | ORAL_TABLET | Freq: Every evening | ORAL | Status: DC | PRN

## 2016-10-08 MED ORDER — GABAPENTIN 100 MG PO CAPS
100.0000 mg | ORAL_CAPSULE | Freq: Every day | ORAL | Status: DC
  Administered 2016-10-08: 100 mg via ORAL
  Filled 2016-10-08: qty 1

## 2016-10-08 MED ORDER — ALUM & MAG HYDROXIDE-SIMETH 200-200-20 MG/5ML PO SUSP: 30.0000 mL | Freq: Four times a day (QID) | ORAL | Status: DC | PRN

## 2016-10-08 MED ORDER — ATORVASTATIN CALCIUM 40 MG PO TABS: 40.0000 mg | ORAL_TABLET | Freq: Every day | ORAL | Status: DC

## 2016-10-08 MED ORDER — TRAZODONE HCL 50 MG PO TABS: 150.0000 mg | ORAL_TABLET | Freq: Every evening | ORAL | Status: DC | PRN

## 2016-10-08 MED ORDER — ACETAMINOPHEN 325 MG PO TABS: 650.0000 mg | ORAL_TABLET | ORAL | Status: DC | PRN

## 2016-10-08 NOTE — ED Notes (Signed)
Pt reports he takes insulin at night and typically checks his sugar in the morning at home.

## 2016-10-08 NOTE — ED Notes (Signed)
TTS at bedside. 

## 2016-10-08 NOTE — ED Provider Notes (Signed)
[facility] DEPT Provider Note   CSN: [id number] Arrival date & time: 10/08/16  1125     History   Chief Complaint No chief complaint on file.   HPI [name] is a 61 y.o. male with a PMHx of depression with prior remote suicide attempt, DM2, GERD, gastroparesis, HTN, HLD, neuropathy, bipolar 1 disorder, PTSD, and renal cell carcinoma s/p nephrectomy, who presents to the ED with complaints of gradually worsening depression after the death of his mother 8 months ago. Patient states that he has been having nightmares related to his mother's death which upsets him. He has been having thoughts of suicide and mentions having a vague plan of using chemicals or knives that he has at home in order to commit suicide. He also mentions that he occasionally sees shadows of people next to his bed, but reports this is intermittent. He denies HI, auditory hallucinations, drug use, or alcohol use. He admits to being a cigarette smoker. He denies any other medical complaints at this time. He is compliant with all his medications, and last took them around 6 AM. He admittedly had been taking his metformin and metoprolol only once a day, instead of twice a day which is how it's listed in his chart, but the rest of them he is taking as prescribed. He has also been started on gabapentin 100 mg at bedtime for arthritis pain. The remainder of his meds are correct as listed in the [facility] on Epic. He is here voluntarily for help with his depression.    The history is provided by the patient and medical records. No language interpreter was used.  Mental Health Problem  Presenting symptoms: depression, hallucinations and suicidal thoughts   Presenting symptoms: no homicidal ideas   Onset quality:  Gradual Duration:  8 months Timing:  Constant Progression:  Worsening Chronicity:  New Context: stressful life event   Context: not alcohol use, not drug abuse and not noncompliant   Treatment compliance:  All of  the time Time since last psychoactive medication taken:  9 hours Relieved by:  None tried Worsened by:  Nothing Ineffective treatments:  None tried Associated symptoms: no abdominal pain and no chest pain   Risk factors: hx of mental illness, hx of suicide attempts and recent psychiatric admission     Past Medical History:  Diagnosis Date  . Arthritis   . Cancer ([place])   . Depression   . Diabetes mellitus   . GERD (gastroesophageal reflux disease)   . H/O suicide attempt 1997 and 2011   jumped out of moving vehicle and attempted to jump into moving traffic  . High cholesterol   . Hypertension   . Neuropathic pain     Patient Active Problem List   Diagnosis Date Noted  . Diabetes type 2, controlled ([place]) 01/01/2015  . Erectile dysfunction 01/01/2015  . Healthcare maintenance 01/01/2015  . Bipolar 1 disorder, depressed ([place]) 07/23/2013  . Bipolar disorder ([place]) 04/03/2013  . MDD (major depressive disorder), recurrent, severe, with psychosis ([place]) 11/04/2012  . Suicidal ideation 11/04/2012  . Post traumatic stress disorder (PTSD) 11/04/2012  . CARCINOMA, RENAL CELL 06/05/2008  . HYPERLIPIDEMIA 06/05/2008  . HYPERTENSION 06/05/2008  . CHEST PAIN 06/05/2008    Past Surgical History:  Procedure Laterality Date  . NEPHRECTOMY     due to cancer       Home Medications    Prior to Admission medications   Medication Sig Start Date End Date Taking? Authorizing Provider  atorvastatin (LIPITOR) 40  MG tablet Take 1 tablet (40 mg total) by mouth daily. 07/27/13   Niel Hummer, NP  benzonatate (TESSALON) 100 MG capsule Take 1 capsule (100 mg total) by mouth 2 (two) times daily as needed for cough. 03/21/15   Haney, Amedeo Plenty, MD  Blood Glucose Monitoring Suppl (ACCU-CHEK AVIVA PLUS) W/DEVICE KIT 1 Device by Does not apply route once. 01/06/15   Veatrice Bourbon, MD  Blood Glucose Monitoring Suppl (PRECISION XTRA MONITOR) DEVI 1 Device by Does not apply route daily. 01/01/15   Veatrice Bourbon, MD  glipiZIDE (GLUCOTROL) 10 MG tablet Take 1 tablet (10 mg total) by mouth daily before breakfast. 07/27/13   Niel Hummer, NP  lamoTRIgine (LAMICTAL) 25 MG tablet Take 1 tablet (25 mg total) by mouth 2 (two) times daily. 07/27/13   Niel Hummer, NP  lisinopril (PRINIVIL,ZESTRIL) 20 MG tablet Take 1 tablet (20 mg total) by mouth daily. 07/27/13   Niel Hummer, NP  metFORMIN (GLUCOPHAGE) 1000 MG tablet TAKE ONE TABLET BY MOUTH TWICE DAILY WITH MEAL 03/21/15   Haney, Alyssa A, MD  metoprolol (LOPRESSOR) 50 MG tablet Take 0.5 tablets (25 mg total) by mouth 2 (two) times daily. 07/27/13   Niel Hummer, NP  sildenafil (REVATIO) 20 MG tablet TAKE 1 TO 3 TABLETS BY MOUTH EVERY DAY AS NEEDED erectile dysfunction 02/18/16   Marina Goodell A, MD  sildenafil (VIAGRA) 25 MG tablet Take 1 tablet (25 mg total) by mouth daily as needed for erectile dysfunction. 01/07/16   Veatrice Bourbon, MD  traZODone (DESYREL) 150 MG tablet Take 1 tablet (150 mg total) by mouth at bedtime as needed for sleep. 07/27/13   Niel Hummer, NP    Family History No family history on file.  Social History Social History  Substance Use Topics  . Smoking status: Current Every Day Smoker    Packs/day: 0.50    Years: 20.00    Types: Cigarettes  . Smokeless tobacco: Never Used  . 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  Respiratory: Negative for shortness of breath.   Cardiovascular: Negative for chest pain.  Gastrointestinal: Negative for abdominal pain, constipation, diarrhea, nausea and vomiting.  Genitourinary: Negative for dysuria and hematuria.  Musculoskeletal: Negative for arthralgias and myalgias.  Skin: Negative for color change.  Allergic/Immunologic: Positive for immunocompromised state (DM2).  Neurological: Negative for weakness and numbness.    Psychiatric/Behavioral: Positive for hallucinations, sleep disturbance and suicidal ideas. Negative for confusion and homicidal ideas.   All other systems reviewed and are negative for acute change except as noted in the HPI.    Physical Exam Updated Vital Signs BP (!) 136/91   Pulse (!) 130   Temp 98.9 F (37.2 C) (Oral)   Resp 16   SpO2 100%  Recheck VS: BP (!) 144/99 (BP Location: Right Arm)   Pulse 84   Temp 98.9 F (37.2 C) (Oral)   Resp 16   SpO2 99%    Physical Exam  Constitutional: He is oriented to person, place, and time. Vital signs are normal. He appears well-developed and well-nourished.  Non-toxic appearance. No distress.  Afebrile, nontoxic, NAD  HENT:  Head: Normocephalic and atraumatic.  Mouth/Throat: Oropharynx is clear and moist and mucous membranes are normal.  Eyes: Conjunctivae and EOM are  normal. Right eye exhibits no discharge. Left eye exhibits no discharge.  Neck: Normal range of motion. Neck supple.  Cardiovascular: Normal rate, regular rhythm, normal heart sounds and intact distal pulses.  Exam reveals no gallop and no friction rub.   No murmur heard. Initially tachycardic in triage, but resolved on exam  Pulmonary/Chest: Effort normal and breath sounds normal. No respiratory distress. He has no decreased breath sounds. He has no wheezes. He has no rhonchi. He has no rales.  Abdominal: Soft. Normal appearance and bowel sounds are normal. He exhibits no distension. There is no tenderness. There is no rigidity, no rebound, no guarding, no CVA tenderness, no tenderness at McBurney's point and negative Murphy's sign.  Musculoskeletal: Normal range of motion.  Neurological: He is alert and oriented to person, place, and time. He has normal strength. No sensory deficit.  Skin: Skin is warm, dry and intact. No rash noted.  Psychiatric: He is actively hallucinating. He exhibits a depressed mood. He expresses suicidal ideation. He expresses no homicidal  ideation. He expresses suicidal plans. He expresses no homicidal plans.  Depressed affect, but pleasant and cooperative. Endorsing SI with a plan, denies HI or AH, reports VH however doesn't seem to be responding to internal stimuli.   Nursing note and vitals reviewed.    ED Treatments / Results  Labs (all labs ordered are listed, but only abnormal results are displayed) Labs Reviewed  COMPREHENSIVE METABOLIC PANEL - Abnormal; Notable for the following:       Result Value   Glucose, Bld 198 (*)    ALT 13 (*)    All other components within normal limits  ACETAMINOPHEN LEVEL - Abnormal; Notable for the following:    Acetaminophen (Tylenol), Serum <10 (*)    All other components within normal limits  ETHANOL  SALICYLATE LEVEL  CBC  RAPID URINE DRUG SCREEN, HOSP PERFORMED    EKG  EKG Interpretation None       Radiology No results found.  Procedures Procedures (including critical care time)  Medications Ordered in ED Medications  metFORMIN (GLUCOPHAGE) tablet 1,000 mg (not administered)  lamoTRIgine (LAMICTAL) tablet 25 mg (not administered)  glipiZIDE (GLUCOTROL) tablet 10 mg (not administered)  lisinopril (PRINIVIL,ZESTRIL) tablet 20 mg (not administered)  metoprolol tartrate (LOPRESSOR) tablet 25 mg (not administered)  traZODone (DESYREL) tablet 150 mg (not administered)  atorvastatin (LIPITOR) tablet 40 mg (not administered)  acetaminophen (TYLENOL) tablet 650 mg (not administered)  zolpidem (AMBIEN) tablet 5 mg (not administered)  ondansetron (ZOFRAN) tablet 4 mg (not administered)  alum & mag hydroxide-simeth (MAALOX/MYLANTA) 200-200-20 MG/5ML suspension 30 mL (not administered)  nicotine (NICODERM CQ - dosed in mg/24 hours) patch 21 mg (not administered)  gabapentin (NEURONTIN) capsule 100 mg (not administered)     Initial Impression / Assessment and Plan / ED Course  I have reviewed the triage vital signs and the nursing notes.  Pertinent labs & imaging  results that were available during my care of the patient were reviewed by me and considered in my medical decision making (see chart for details).     61 y.o. male here with SI with a plan, and occasional visual hallucinations, and nightmares since the death of his mother 8 months ago. Compliant with meds, denies other complaints, admits to cigarette smoking, smoking cessation strongly advised; denies EtOH/drug use. Denies HI/AH. Here voluntarily. Screening labs unremarkable except for glucose 198 which is consistent with prior values, no evidence of DKA. Pt medically cleared at this time. Psych hold orders  and home med orders placed. Please see TTS notes for further documentation of care/dispo. PLEASE NOTE THAT PT IS HERE VOLUNTARILY AT THIS TIME, IF PT TRIES TO LEAVE THEY WOULD NEED IVC PAPERWORK TAKEN OUT. Pt stable at time of med clearance.     Final Clinical Impressions(s) / ED Diagnoses   Final diagnoses:  Suicidal ideation  Medical clearance for psychiatric admission  Tobacco user  Visual hallucination  Type 2 diabetes mellitus with hyperglycemia, without long-term current use of insulin [facility])    New Prescriptions New Prescriptions   No medications on [address], [place], [place] 10/08/16 [place], [place], DO 10/11/16 1502

## 2016-10-08 NOTE — ED Notes (Signed)
Pt has been wanded by security.  Officer stated "hes good but he still has his watch on and his t-shirt"

## 2016-10-08 NOTE — ED Notes (Signed)
Notified pharmacy for medications to be verified.
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
## 2016-10-08 NOTE — ED Triage Notes (Signed)
Pt in c/o depression & SI and HI, pt denies plan, pt states, "i just don't feel myself. I have been on mental wards before." pt calm & cooperative at this time

## 2016-10-08 NOTE — ED Notes (Signed)
Called staffing for a sitter 

## 2016-10-09 ENCOUNTER — Inpatient Hospital Stay (HOSPITAL_COMMUNITY)
Admission: AD | Admit: 2016-10-09 | Discharge: 2016-10-13 | DRG: 885 | Disposition: A | Discharge status: Home / Self Care | Payer: Medicaid Other | Source: Intra-hospital | Attending: Psychiatry | Admitting: Psychiatry

## 2016-10-09 ENCOUNTER — Encounter (HOSPITAL_COMMUNITY): Payer: Self-pay

## 2016-10-09 DIAGNOSIS — Z915 Personal history of self-harm: Secondary | ICD-10-CM | POA: Diagnosis not present

## 2016-10-09 DIAGNOSIS — X58XXXA Exposure to other specified factors, initial encounter: Secondary | ICD-10-CM | POA: Diagnosis present

## 2016-10-09 DIAGNOSIS — F333 Major depressive disorder, recurrent, severe with psychotic symptoms: Secondary | ICD-10-CM | POA: Diagnosis present

## 2016-10-09 DIAGNOSIS — Z905 Acquired absence of kidney: Secondary | ICD-10-CM | POA: Diagnosis not present

## 2016-10-09 DIAGNOSIS — R441 Visual hallucinations: Secondary | ICD-10-CM | POA: Diagnosis not present

## 2016-10-09 DIAGNOSIS — F4310 Post-traumatic stress disorder, unspecified: Secondary | ICD-10-CM | POA: Diagnosis present

## 2016-10-09 DIAGNOSIS — K3184 Gastroparesis: Secondary | ICD-10-CM | POA: Diagnosis present

## 2016-10-09 DIAGNOSIS — Z85528 Personal history of other malignant neoplasm of kidney: Secondary | ICD-10-CM | POA: Diagnosis not present

## 2016-10-09 DIAGNOSIS — T383X6A Underdosing of insulin and oral hypoglycemic [antidiabetic] drugs, initial encounter: Secondary | ICD-10-CM | POA: Diagnosis present

## 2016-10-09 DIAGNOSIS — F332 Major depressive disorder, recurrent severe without psychotic features: Principal | ICD-10-CM | POA: Diagnosis present

## 2016-10-09 DIAGNOSIS — G4700 Insomnia, unspecified: Secondary | ICD-10-CM | POA: Diagnosis not present

## 2016-10-09 DIAGNOSIS — E1142 Type 2 diabetes mellitus with diabetic polyneuropathy: Secondary | ICD-10-CM | POA: Diagnosis present

## 2016-10-09 DIAGNOSIS — F17210 Nicotine dependence, cigarettes, uncomplicated: Secondary | ICD-10-CM | POA: Diagnosis present

## 2016-10-09 DIAGNOSIS — Z79899 Other long term (current) drug therapy: Secondary | ICD-10-CM

## 2016-10-09 DIAGNOSIS — K219 Gastro-esophageal reflux disease without esophagitis: Secondary | ICD-10-CM | POA: Diagnosis present

## 2016-10-09 DIAGNOSIS — E1143 Type 2 diabetes mellitus with diabetic autonomic (poly)neuropathy: Secondary | ICD-10-CM | POA: Diagnosis present

## 2016-10-09 DIAGNOSIS — Z91128 Patient's intentional underdosing of medication regimen for other reason: Secondary | ICD-10-CM

## 2016-10-09 DIAGNOSIS — Z7984 Long term (current) use of oral hypoglycemic drugs: Secondary | ICD-10-CM | POA: Diagnosis not present

## 2016-10-09 DIAGNOSIS — Z818 Family history of other mental and behavioral disorders: Secondary | ICD-10-CM | POA: Diagnosis not present

## 2016-10-09 DIAGNOSIS — E119 Type 2 diabetes mellitus without complications: Secondary | ICD-10-CM | POA: Diagnosis not present

## 2016-10-09 DIAGNOSIS — E7800 Pure hypercholesterolemia, unspecified: Secondary | ICD-10-CM | POA: Diagnosis present

## 2016-10-09 DIAGNOSIS — R45851 Suicidal ideations: Secondary | ICD-10-CM | POA: Diagnosis not present

## 2016-10-09 DIAGNOSIS — I10 Essential (primary) hypertension: Secondary | ICD-10-CM | POA: Diagnosis present

## 2016-10-09 DIAGNOSIS — T447X6A Underdosing of beta-adrenoreceptor antagonists, initial encounter: Secondary | ICD-10-CM | POA: Diagnosis present

## 2016-10-09 DIAGNOSIS — F419 Anxiety disorder, unspecified: Secondary | ICD-10-CM | POA: Diagnosis not present

## 2016-10-09 DIAGNOSIS — E785 Hyperlipidemia, unspecified: Secondary | ICD-10-CM | POA: Diagnosis not present

## 2016-10-09 LAB — GLUCOSE, CAPILLARY: GLUCOSE-CAPILLARY: 129 mg/dL — AB (ref 65–99)

## 2016-10-09 LAB — CBG MONITORING, ED: Glucose-Capillary: 155 mg/dL — ABNORMAL HIGH (ref 65–99)

## 2016-10-09 MED ORDER — GLIPIZIDE 10 MG PO TABS
10.0000 mg | ORAL_TABLET | Freq: Every day | ORAL | Status: DC
  Administered 2016-10-10 – 2016-10-13 (×4): 10 mg via ORAL
  Filled 2016-10-09 (×6): qty 1

## 2016-10-09 MED ORDER — ZOLPIDEM TARTRATE 5 MG PO TABS
5.0000 mg | ORAL_TABLET | Freq: Every evening | ORAL | Status: DC | PRN
  Administered 2016-10-09: 5 mg via ORAL
  Filled 2016-10-09: qty 1

## 2016-10-09 MED ORDER — NICOTINE 21 MG/24HR TD PT24
21.0000 mg | MEDICATED_PATCH | Freq: Every day | TRANSDERMAL | Status: DC
  Administered 2016-10-10 – 2016-10-12 (×3): 21 mg via TRANSDERMAL
  Filled 2016-10-09 (×7): qty 1

## 2016-10-09 MED ORDER — LISINOPRIL 20 MG PO TABS
20.0000 mg | ORAL_TABLET | Freq: Every day | ORAL | Status: DC
  Administered 2016-10-10 – 2016-10-13 (×4): 20 mg via ORAL
  Filled 2016-10-09 (×6): qty 1

## 2016-10-09 MED ORDER — ACETAMINOPHEN 325 MG PO TABS
650.0000 mg | ORAL_TABLET | ORAL | Status: DC | PRN
  Administered 2016-10-11: 650 mg via ORAL
  Filled 2016-10-09: qty 2

## 2016-10-09 MED ORDER — GABAPENTIN 100 MG PO CAPS
100.0000 mg | ORAL_CAPSULE | Freq: Every day | ORAL | Status: DC
  Administered 2016-10-09 – 2016-10-10 (×2): 100 mg via ORAL
  Filled 2016-10-09 (×4): qty 1

## 2016-10-09 MED ORDER — ATORVASTATIN CALCIUM 40 MG PO TABS
40.0000 mg | ORAL_TABLET | Freq: Every day | ORAL | Status: DC
  Administered 2016-10-09 – 2016-10-12 (×4): 40 mg via ORAL
  Filled 2016-10-09 (×8): qty 1

## 2016-10-09 MED ORDER — LAMOTRIGINE 25 MG PO TABS
25.0000 mg | ORAL_TABLET | Freq: Two times a day (BID) | ORAL | Status: DC
  Administered 2016-10-09 – 2016-10-11 (×4): 25 mg via ORAL
  Filled 2016-10-09 (×8): qty 1

## 2016-10-09 MED ORDER — TRAZODONE HCL 150 MG PO TABS
150.0000 mg | ORAL_TABLET | Freq: Every evening | ORAL | Status: DC | PRN
  Administered 2016-10-09 – 2016-10-10 (×2): 150 mg via ORAL
  Filled 2016-10-09 (×2): qty 1

## 2016-10-09 MED ORDER — METFORMIN HCL 500 MG PO TABS
1000.0000 mg | ORAL_TABLET | Freq: Two times a day (BID) | ORAL | Status: DC
  Administered 2016-10-09 – 2016-10-13 (×8): 1000 mg via ORAL
  Filled 2016-10-09 (×12): qty 2

## 2016-10-09 MED ORDER — ALUM & MAG HYDROXIDE-SIMETH 200-200-20 MG/5ML PO SUSP: 30.0000 mL | Freq: Four times a day (QID) | ORAL | Status: DC | PRN

## 2016-10-09 MED ORDER — ONDANSETRON HCL 4 MG PO TABS: 4.0000 mg | ORAL_TABLET | Freq: Three times a day (TID) | ORAL | Status: DC | PRN

## 2016-10-09 MED ORDER — MAGNESIUM HYDROXIDE 400 MG/5ML PO SUSP: 30.0000 mL | Freq: Every day | ORAL | Status: DC | PRN

## 2016-10-09 MED ORDER — HYDROXYZINE HCL 25 MG PO TABS
25.0000 mg | ORAL_TABLET | Freq: Three times a day (TID) | ORAL | Status: DC | PRN
  Administered 2016-10-11: 25 mg via ORAL
  Filled 2016-10-09: qty 1

## 2016-10-09 MED ORDER — METOPROLOL TARTRATE 25 MG PO TABS
25.0000 mg | ORAL_TABLET | Freq: Two times a day (BID) | ORAL | Status: DC
  Administered 2016-10-09 – 2016-10-13 (×8): 25 mg via ORAL
  Filled 2016-10-09 (×13): qty 1

## 2016-10-09 NOTE — ED Notes (Signed)
Patient was given Saltine Crackers and Sprite, and A Regular Diet was ordered for Lunch.

## 2016-10-09 NOTE — ED Notes (Signed)
Pt asked "When are they coming to get me to take me to [facility]?" Advised pt he has not ambulated for RN as of yet. States he will do it now. Pt ambulated in hallway slowly - states "As long as nobody pushes me, I will be OK" then laughs.

## 2016-10-09 NOTE — ED Notes (Signed)
Pt refuses to attempt to ambulate w/walker. States "I have to have my cane because of my knee" - point toward right knee. States has been ambulating w/cane "for years" and "they left me have it at the [place]".
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
## 2016-10-09 NOTE — ED Notes (Signed)
Pt has cane at bedside and glasses on his head.

## 2016-10-09 NOTE — ED Notes (Addendum)
Pt states he wants to go to [facility] now. States if he is given a knee brace for his right knee - he will not need the cane. Requested for pt to attempt to ambulate w/walker - pt refused. Per staff, pt ambulated to bathroom and back to room w/o cane earlier this shift w/o difficulty.

## 2016-10-09 NOTE — ED Notes (Signed)
Pt voices agreement w/tx plan - accepted to [facility] - pt signed consent forms - copy faxed to [facility], copy sent to Medical Records, and original placed in envelope for [facility].

## 2016-10-09 NOTE — ED Notes (Signed)
Pt retains cane and glasses.

## 2016-10-09 NOTE — ED Notes (Signed)
Sitter escorting pt to shower via w/c d/t pt now states too far to walk.

## 2016-10-09 NOTE — ED Notes (Signed)
Pt's belongings are stored in locker #6, and valuables are in safe per Roselyn Reef, Therapist, sports.

## 2016-10-09 NOTE — Progress Notes (Signed)
[facility] team has been informed that patient wants to come for treatment here and that he was able to walk slowly without support.  Verlon Setting, MSW, [place] Clinical social worker in [place] [facility], [place] Office [phone] and [phone] 10/09/2016 11:53 AM

## 2016-10-09 NOTE — BHH Suicide Risk Assessment (Signed)
[place] INPATIENT:  Family/Significant Other Suicide Prevention Education  Suicide Prevention Education:  Patient Refusal for Family/Significant Other Suicide Prevention Education: The patient [name] has refused to provide written consent for family/significant other to be provided Family/Significant Other Suicide Prevention Education during admission and/or prior to discharge.  Physician notified.  [place] Hun Grossman-Orr 10/09/2016, 5:14 PM

## 2016-10-09 NOTE — Progress Notes (Signed)
Patient was accepted at [facility], to Dr. Parke Poisson, room 405-bed2. ETA 13:00. Please call report at [phone]. [facility] [place] was notified.  Verlon Setting, MSW, LCSWA Clinical social worker in [place] [facility], [place] Office 10/09/2016 12:18 PM

## 2016-10-09 NOTE — ED Notes (Signed)
Pt returned from shower

## 2016-10-09 NOTE — Tx Team (Signed)
Initial Treatment Plan 10/09/2016 2:19 PM [name] HFW:[id number]    PATIENT STRESSORS: Health problems Loss of Mother (December 2017) Traumatic event   PATIENT STRENGTHS: Capable of independent living Curator fund of knowledge Motivation for treatment/growth   PATIENT IDENTIFIED PROBLEMS: Depression  Suicidal ideation  "Get better"  "Lesser depression"               DISCHARGE CRITERIA:  Improved stabilization in mood, thinking, and/or behavior Verbal commitment to aftercare and medication compliance  PRELIMINARY DISCHARGE PLAN: Outpatient therapy Medication management  PATIENT/FAMILY INVOLVEMENT: This treatment plan has been presented to and reviewed with the patient, [name].  The patient and family have been given the opportunity to ask questions and make suggestions.  Windell Moment, RN 10/09/2016, 2:19 PM

## 2016-10-09 NOTE — Progress Notes (Signed)
[name] is a 61 year old male being admitted voluntarily to 405-2 from [facility].  He came to the ED with worsening depression since the passing of his mother this past December.  He reported suicidal ideation and didn't feel safe at home.  He denies HI or A/V hallucinations.  He has history of diabetes, HTN, GERD and chronic right knee pain.  Oriented him to the unit.  Admission paperwork completed and signed.  Belongings searched and secured in locker # 43.  Skin assessment completed and no skin issues noted.  Q 15 minute checks initiated for safety.  We will monitor the progress towards his goals.

## 2016-10-09 NOTE — ED Notes (Signed)
Pt ambulating w/walker to room from bathroom.

## 2016-10-09 NOTE — Progress Notes (Signed)
Writer spoke with patient 1:1 in his room. He had already layed down after attending group and snacks. Writer informed him of his medications which he took. He reported that his mother died last year and he has had a hard time dealing with it and several other losses over the years with his siblings. He reports his sister and brother do not know he is here and he does not want to contact them because he reports this is his problem. He reports that he attends a group who are supportive. He returned to his room to rest after meds. Safety maintained on unit with 15 min checks.

## 2016-10-10 ENCOUNTER — Encounter (HOSPITAL_COMMUNITY): Payer: Self-pay | Admitting: Psychiatry

## 2016-10-10 DIAGNOSIS — K219 Gastro-esophageal reflux disease without esophagitis: Secondary | ICD-10-CM

## 2016-10-10 DIAGNOSIS — F4310 Post-traumatic stress disorder, unspecified: Secondary | ICD-10-CM

## 2016-10-10 DIAGNOSIS — F17210 Nicotine dependence, cigarettes, uncomplicated: Secondary | ICD-10-CM

## 2016-10-10 DIAGNOSIS — F419 Anxiety disorder, unspecified: Secondary | ICD-10-CM

## 2016-10-10 DIAGNOSIS — E119 Type 2 diabetes mellitus without complications: Secondary | ICD-10-CM

## 2016-10-10 DIAGNOSIS — R45851 Suicidal ideations: Secondary | ICD-10-CM

## 2016-10-10 DIAGNOSIS — I10 Essential (primary) hypertension: Secondary | ICD-10-CM

## 2016-10-10 DIAGNOSIS — F332 Major depressive disorder, recurrent severe without psychotic features: Principal | ICD-10-CM

## 2016-10-10 DIAGNOSIS — K3184 Gastroparesis: Secondary | ICD-10-CM

## 2016-10-10 DIAGNOSIS — G4700 Insomnia, unspecified: Secondary | ICD-10-CM

## 2016-10-10 DIAGNOSIS — E785 Hyperlipidemia, unspecified: Secondary | ICD-10-CM

## 2016-10-10 LAB — GLUCOSE, CAPILLARY
GLUCOSE-CAPILLARY: 184 mg/dL — AB (ref 65–99)
Glucose-Capillary: 171 mg/dL — ABNORMAL HIGH (ref 65–99)
Glucose-Capillary: 243 mg/dL — ABNORMAL HIGH (ref 65–99)
Glucose-Capillary: 140 mg/dL — ABNORMAL HIGH (ref 65–99)

## 2016-10-10 MED ORDER — INSULIN GLARGINE 100 UNIT/ML ~~LOC~~ SOLN
10.0000 [IU] | Freq: Every day | SUBCUTANEOUS | Status: DC
  Administered 2016-10-10 – 2016-10-12 (×3): 10 [IU] via SUBCUTANEOUS

## 2016-10-10 MED ORDER — INSULIN ASPART 100 UNIT/ML ~~LOC~~ SOLN
0.0000 [IU] | Freq: Three times a day (TID) | SUBCUTANEOUS | Status: DC
  Administered 2016-10-10: 5 [IU] via SUBCUTANEOUS
  Administered 2016-10-11: 3 [IU] via SUBCUTANEOUS
  Administered 2016-10-11: 5 [IU] via SUBCUTANEOUS
  Administered 2016-10-11: 2 [IU] via SUBCUTANEOUS
  Administered 2016-10-12: 3 [IU] via SUBCUTANEOUS
  Administered 2016-10-12: 2 [IU] via SUBCUTANEOUS
  Administered 2016-10-12 – 2016-10-13 (×3): 3 [IU] via SUBCUTANEOUS

## 2016-10-10 NOTE — BHH Group Notes (Signed)
Healthy Suppor tSystems   Date:  10/10/2016  Time:  1100  Type of Therapy:  Nurse Education  /  The group focuses on teaching patients  how to develop and utilize supprt systems that will help them in theeri recovery.  Participation Level:  Active  Participation Quality:  Attentive  Affect:  Appropriate  Cognitive:  Appropriate  Insight:  Appropriate  Engagement in Group:  Engaged  Modes of Intervention:  Education  Summary of Progress/Problems:  Lauralyn Primes 10/10/2016, 1:05 PM

## 2016-10-10 NOTE — Progress Notes (Signed)
Patient attended the evening group session and responded to all discussion prompts from this Probation officer. Patient shared his goal was to feel better. Patients affect was appropriate and rated his day a 6 out of 10.

## 2016-10-10 NOTE — BHH Group Notes (Signed)
[place] LCSW Group Therapy Note  Date/Time 10/10/16 10:00AM  Type of Therapy and Topic:  Group Therapy:  Cognitive Distortions  Participation Level:  Minimal   Description of Group:    Patients in this group will be introduced to the topic of cognitive distortions.  Patients will identify and describe cognitive distortions, describe the feelings these distortions create for them.  Patients will identify one or more situations in their personal life where they have cognitively distorted thinking and will verbalize challenging this cognitive distortion through positive thinking skills.  Patients will practice the skill of using positive affirmations to challenge cognitive distortions.    Therapeutic Goals:  1. Patient will identify two or more cognitive distortions they have used 2. Patient will identify one or more emotions that stem from use of a cognitive distortion 3. Patient will demonstrate use of a positive affirmation to counter a cognitive distortion through discussion and/or role play. 4. Patient will describe one way cognitive distortions can be detrimental to wellness   Summary of Patient Progress: Patient entered group after 15 minutes. Patient did not speak during group but stayed attentive as peers shared.   Therapeutic Modalities:   Cognitive Behavioral Therapy Motivational Interviewing   Christene Lye MSW, LCSW

## 2016-10-10 NOTE — Progress Notes (Signed)
Nursing Progress Note [id number]  D) Patient presents pleasant with bright, animated affect. Patient reports, "I feel much better". Patient compliant with medications this evening and states, "I think I need insulin. At home I take 10 units". Patient CBG 140 this evening. Patient observed up in the milieu and attended group. Patient denies concerns for writer and requests medications for sleep. Patient inquiring about Ambien this evening. Patient informed Ambien order was discontinued, patient verbalized understanding. Patient denies SI/HI/AVH or pain and contracts for safety on the unit.  A) Emotional support given. 1:1 interaction and active listening provided. Patient medicated as prescribed. Medications and plan of care reviewed with patient. Patient verbalized understanding without further questions. Snacks and fluids provided. Opportunities for questions or concerns presented to patient. Patient encouraged to continue to work on treatment goals. Labs, vital signs and patient behavior monitored throughout shift. Patient safety maintained with q15 min safety checks. Moderate fall risk precautions in place and reviewed with patient; patient verbalized understanding.  R) Patient receptive to interaction with nurse. Patient remains safe on the unit at this time. Patient denies any adverse medication reactions at this time. Patient is resting in bed without complaints. Will continue to monitor.

## 2016-10-10 NOTE — BHH Suicide Risk Assessment (Signed)
[facility] Admission Suicide Risk Assessment   Nursing information obtained from:  Patient Demographic factors:  Living alone Current Mental Status:  Suicidal ideation indicated by patient Loss Factors:  Loss of significant relationship Historical Factors:  Prior suicide attempts, Family history of suicide, Family history of mental illness or substance abuse Risk Reduction Factors:  NA  Total Time spent with patient: 30 minutes Principal Problem: MDD (major depressive disorder), recurrent severe, without psychosis ([place]) Diagnosis:   Patient Active Problem List   Diagnosis Date Noted  . MDD (major depressive disorder), recurrent severe, without psychosis ([place]) [F33.2] 10/09/2016  . Diabetes type 2, controlled ([place]) [E11.9] 01/01/2015  . Erectile dysfunction [N52.9] 01/01/2015  . Healthcare maintenance [Z00.00] 01/01/2015  . Bipolar 1 disorder, depressed ([place]) [F31.9] 07/23/2013  . Bipolar disorder ([place]) [F31.9] 04/03/2013  . MDD (major depressive disorder), recurrent, severe, with psychosis ([place]) [F33.3] 11/04/2012  . Suicidal ideation [R45.851] 11/04/2012  . Post traumatic stress disorder (PTSD) [F43.10] 11/04/2012  . CARCINOMA, RENAL CELL [C64.9] 06/05/2008  . HYPERLIPIDEMIA [E78.5] 06/05/2008  . HYPERTENSION [I10] 06/05/2008  . CHEST PAIN [R07.9] 06/05/2008   Subjective Data: Pt reports he is depressed since he cannot cope with his mother's death 8  Months ago. He felt more and more suicidal and decided to ask for help. Pt reports PTSD from watching people hurting others and suffering and so on. Pt reports he is open to grief counseling.   Continued Clinical Symptoms:  Alcohol Use Disorder Identification Test Final Score (AUDIT): 0 The "Alcohol Use Disorders Identification Test", Guidelines for Use in Primary Care, Second Edition.  World Pharmacologist [facility]). Score between 0-7:  no or low risk or alcohol related problems. Score between 8-15:  moderate risk of alcohol related  problems. Score between 16-19:  high risk of alcohol related problems. Score 20 or above:  warrants further diagnostic evaluation for alcohol dependence and treatment.   CLINICAL FACTORS:   Severe Anxiety and/or Agitation Depression:   Hopelessness Impulsivity Previous Psychiatric Diagnoses and Treatments Medical Diagnoses and Treatments/Surgeries   Musculoskeletal: Strength & Muscle Tone: within normal limits Gait & Station: normal Patient leans: N/A  Psychiatric Specialty Exam: Physical Exam  Review of Systems  Psychiatric/Behavioral: Positive for depression and suicidal ideas. The patient is nervous/anxious.   All other systems reviewed and are negative.   Blood pressure 121/81, pulse (!) 116, temperature 98 F (36.7 C), temperature source Oral, resp. rate 18, height 5' 7.5" (1.715 m), weight 100.7 kg (222 lb).Body mass index is 34.26 kg/m.  General Appearance: Casual  Eye Contact:  Fair  Speech:  Clear and Coherent  Volume:  Normal  Mood:  Anxious, Depressed and Dysphoric  Affect:  Appropriate  Thought Process:  Goal Directed and Descriptions of Associations: Circumstantial  Orientation:  Full (Time, Place, and Person)  Thought Content:  Rumination  Suicidal Thoughts:  Yes.  without intent/plan  Homicidal Thoughts:  No  Memory:  Immediate;   Fair Recent;   Fair Remote;   Fair  Judgement:  Impaired  Insight:  Present  Psychomotor Activity:  Normal  Concentration:  Concentration: Fair and Attention Span: Fair  Recall:  [employer] of Knowledge:  Fair  Language:  Fair  Akathisia:  No  Handed:  Right  AIMS (if indicated):     Assets:  Communication Skills Desire for Improvement  ADL's:  Intact  Cognition:  WNL  Sleep:  Number of Hours: 6.25      COGNITIVE FEATURES THAT CONTRIBUTE TO RISK:  Closed-mindedness, Polarized  thinking and Thought constriction (tunnel vision)    SUICIDE RISK:   Moderate:  Frequent suicidal ideation with limited intensity, and  duration, some specificity in terms of plans, no associated intent, good self-control, limited dysphoria/symptomatology, some risk factors present, and identifiable protective factors, including available and accessible social support.  PLAN OF CARE: Continue Lamictal, gabapentin , trazodone as per MAR. Refer for grief counseling.   I certify that inpatient services furnished can reasonably be expected to improve the patient's condition.   Moraima Burd, MD 10/10/2016, 1:12 PM

## 2016-10-10 NOTE — Progress Notes (Signed)
Data. Patient denies SI/HI/AVH. Verbally contracts for safety on the unit and to come to staff before acting of any self harm thoughts/feelings.  Patient interacting well with staff and other patients. Patient's CBGs did start to trend up this shift. NP notified and orders received for sliding scale and HS lantus. Patient reports, "I feel better today. Everyday is better than the day before." Affect if flat but does brightens with interaction. Action. Emotional support and encouragement offered. Education provided on medication, indications and side effect. Q 15 minute checks done for safety. Response. Safety on the unit maintained through 15 minute checks.  Medications taken as prescribed. Attended groups. Remained calm and appropriate through out shift.

## 2016-10-10 NOTE — H&P (Signed)
Psychiatric Admission Assessment Adult  Patient Identification: [name] MRN:  [id number] Date of Evaluation:  10/10/2016 Chief Complaint:  MDD,rec,episode sev PTSD Principal Diagnosis: MDD (major depressive disorder), recurrent severe, without psychosis ([place]) Diagnosis:   Patient Active Problem List   Diagnosis Date Noted  . MDD (major depressive disorder), recurrent severe, without psychosis ([place]) [F33.2] 10/09/2016  . Diabetes type 2, controlled ([place]) [E11.9] 01/01/2015  . Erectile dysfunction [N52.9] 01/01/2015  . Healthcare maintenance [Z00.00] 01/01/2015  . Bipolar 1 disorder, depressed ([place]) [F31.9] 07/23/2013  . Bipolar disorder ([place]) [F31.9] 04/03/2013  . MDD (major depressive disorder), recurrent, severe, with psychosis ([place]) [F33.3] 11/04/2012  . Suicidal ideation [R45.851] 11/04/2012  . Post traumatic stress disorder (PTSD) [F43.10] 11/04/2012  . CARCINOMA, RENAL CELL [C64.9] 06/05/2008  . HYPERLIPIDEMIA [E78.5] 06/05/2008  . HYPERTENSION [I10] 06/05/2008  . CHEST PAIN [R07.9] 06/05/2008   History of Present Illness: Noted by ED provider- Nathanyal Ashmead is a 61 y.o. male with a PMHx of depression with prior remote suicide attempt, DM2, GERD, gastroparesis, HTN, HLD, neuropathy, bipolar 1 disorder, PTSD, and renal cell carcinoma s/p nephrectomy, who presents to the ED with complaints of gradually worsening depression after the death of his mother 8 months ago. Patient states that he has been having nightmares related to his mother's death which upsets him. He has been having thoughts of suicide and mentions having a vague plan of using chemicals or knives that he has at home in order to commit suicide. He also mentions that he occasionally sees shadows of people next to his bed, but reports this is intermittent. He denies HI, auditory hallucinations, drug use, or alcohol use. He admits to being a cigarette smoker. He denies any other medical complaints at this time. He is  compliant with all his medications, and last took them around 6 AM. He admittedly had been taking his metformin and metoprolol only once a day, instead of twice a day which is how it's listed in his chart, but the rest of them he is taking as prescribed. He has also been started on gabapentin 100 mg at bedtime for arthritis pain. The remainder of his meds are correct as listed in the [facility] on Epic. He is here voluntarily for help with his depression.   On Evaluation: [name] is awake, alert and oriented. Patient present with a pleasant but flat affect.  Ysabel validates the information that was provided in the notes above. Reports is medications are prescribed by the [place] in [place]. Reports a family history of mental illness. Reports hx of depression and suicidal ideation with 1 attempt about 5 years ago. 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  Denies suicidal or homicidal ideation during this discussion. . Denies auditory or visual hallucination and does not appear to be responding to internal stimuli. Support, encouragement and reassurance was provided.   Associated Signs/Symptoms: Depression Symptoms:  depressed mood, insomnia, suicidal thoughts without plan, suicidal attempt, (Hypo) Manic Symptoms:  Distractibility, Irritable Mood, Anxiety Symptoms:  Excessive Worry, Psychotic Symptoms:  Hallucinations: Visual intermittent shawows PTSD Symptoms: Avoidance:  Decreased Interest/Participation Total Time spent with patient: 45 minutes  Past Psychiatric History: PTSD, depression, Suicidal ideation/attempts  Is the patient at risk to self? Yes.    Has the patient been a risk to self in the past 6 months? Yes.    Has the patient been a risk to self within the distant past? Yes.    Is the patient  a risk to others? No.  Has the patient been a risk to others in the past 6 months? No.  Has the patient been a risk to others within the distant  past? No.   Prior Inpatient Therapy:   Prior Outpatient Therapy:    Alcohol Screening: 1. How often do you have a drink containing alcohol?: Never 9. Have you or someone else been injured as a result of your drinking?: No 10. Has a relative or friend or a doctor or another health worker been concerned about your drinking or suggested you cut down?: No Alcohol Use Disorder Identification Test Final Score (AUDIT): 0 Brief Intervention: AUDIT score less than 7 or less-screening does not suggest unhealthy drinking-brief intervention not indicated Substance Abuse History in the last 12 months:  No. Consequences of Substance Abuse: NA Previous Psychotropic Medications: YES Psychological Evaluations:YES Past Medical History:  Past Medical History:  Diagnosis Date  . Arthritis   . Cancer ([place])   . Depression   . Diabetes mellitus   . GERD (gastroesophageal reflux disease)   . H/O suicide attempt 1997 and 2011   jumped out of moving vehicle and attempted to jump into moving traffic  . High cholesterol   . Hypertension   . Neuropathic pain     Past Surgical History:  Procedure Laterality Date  . NEPHRECTOMY     due to cancer   Family History: History reviewed. No pertinent family history. Family Psychiatric  History: Bipolar, depression and Anxiety  Tobacco Screening: Have you used any form of tobacco in the last 30 days? (Cigarettes, Smokeless Tobacco, Cigars, and/or Pipes): Yes Tobacco use, Select all that apply: 5 or more cigarettes per day Are you interested in Tobacco Cessation Medications?: Yes, will notify MD for an order Counseled patient on smoking cessation including recognizing danger situations, developing coping skills and basic information about quitting provided: Refused/Declined practical counseling Social History:  History  Alcohol Use No    Comment: Pt has been clean for 8 months     History  Drug Use No    Additional Social History: Marital status:  Single Does patient have children?: No                         Allergies:   Allergies  Allergen Reactions  . Morphine And Related Other (See Comments)    Pt unable to take narcotics.    Lab Results:  Results for orders placed or performed during the hospital encounter of 10/09/16 (from the past 48 hour(s))  Glucose, capillary     Status: Abnormal   Collection Time: 10/09/16  4:55 PM  Result Value Ref Range   Glucose-Capillary 129 (H) 65 - 99 mg/dL   Comment 1 Notify RN    Comment 2 Document in Chart   Glucose, capillary     Status: Abnormal   Collection Time: 10/10/16  5:58 AM  Result Value Ref Range   Glucose-Capillary 171 (H) 65 - 99 mg/dL    Blood Alcohol level:  Lab Results  Component Value Date   ETH <5 10/08/2016   ETH <11 26/37/8588    Metabolic Disorder Labs:  Lab Results  Component Value Date   HGBA1C 8.4 (H) 04/03/2013   MPG 194 (H) 04/03/2013   MPG 160 05/03/2008   No results found for: PROLACTIN Lab Results  Component Value Date   CHOL  05/04/2008    127        ATP III CLASSIFICATION:  <  200     mg/dL   Desirable  964-985  mg/dL   Borderline High  >=190    mg/dL   High          TRIG 90 05/04/2008   HDL 27 (L) 05/04/2008   CHOLHDL 4.7 05/04/2008   VLDL 18 05/04/2008   LDLCALC  05/04/2008    82        Total Cholesterol/HDL:CHD Risk Coronary Heart Disease Risk Table                     Men   Women  1/2 Average Risk   3.4   3.3  Average Risk       5.0   4.4  2 X Average Risk   9.6   7.1  3 X Average Risk  23.4   11.0        Use the calculated Patient Ratio above and the CHD Risk Table to determine the patient's CHD Risk.        ATP III CLASSIFICATION (LDL):  <100     mg/dL   Optimal  347-451  mg/dL   Near or Above                    Optimal  130-159  mg/dL   Borderline  219-217  mg/dL   High  >420     mg/dL   Very High    Current Medications: Current Facility-Administered Medications  Medication Dose Route Frequency  Provider Last Rate Last Dose  . acetaminophen (TYLENOL) tablet 650 mg  650 mg Oral Q4H PRN Okonkwo, Justina A, NP      . alum & mag hydroxide-simeth (MAALOX/MYLANTA) 200-200-20 MG/5ML suspension 30 mL  30 mL Oral Q6H PRN Okonkwo, Justina A, NP      . atorvastatin (LIPITOR) tablet 40 mg  40 mg Oral q1800 Okonkwo, Justina A, NP   40 mg at 10/09/16 1848  . gabapentin (NEURONTIN) capsule 100 mg  100 mg Oral QHS Okonkwo, Justina A, NP   100 mg at 10/09/16 2118  . glipiZIDE (GLUCOTROL) tablet 10 mg  10 mg Oral QAC breakfast Okonkwo, Justina A, NP   10 mg at 10/10/16 6615  . hydrOXYzine (ATARAX/VISTARIL) tablet 25 mg  25 mg Oral TID PRN Beryle Lathe, Justina A, NP      . lamoTRIgine (LAMICTAL) tablet 25 mg  25 mg Oral BID Okonkwo, Justina A, NP   25 mg at 10/10/16 0802  . lisinopril (PRINIVIL,ZESTRIL) tablet 20 mg  20 mg Oral Daily Okonkwo, Justina A, NP   20 mg at 10/10/16 0802  . magnesium hydroxide (MILK OF MAGNESIA) suspension 30 mL  30 mL Oral Daily PRN Okonkwo, Justina A, NP      . metFORMIN (GLUCOPHAGE) tablet 1,000 mg  1,000 mg Oral BID WC Okonkwo, Justina A, NP   1,000 mg at 10/10/16 0801  . metoprolol tartrate (LOPRESSOR) tablet 25 mg  25 mg Oral BID Okonkwo, Justina A, NP   25 mg at 10/10/16 0801  . nicotine (NICODERM CQ - dosed in mg/24 hours) patch 21 mg  21 mg Transdermal Daily Okonkwo, Justina A, NP   21 mg at 10/10/16 0803  . ondansetron (ZOFRAN) tablet 4 mg  4 mg Oral Q8H PRN Okonkwo, Justina A, NP      . traZODone (DESYREL) tablet 150 mg  150 mg Oral QHS PRN Beryle Lathe, Justina A, NP   150 mg at 10/09/16 2118   PTA Medications: Prescriptions Prior  to Admission  Medication Sig Dispense Refill Last Dose  . atorvastatin (LIPITOR) 40 MG tablet Take 1 tablet (40 mg total) by mouth daily.     . benzonatate (TESSALON) 100 MG capsule Take 1 capsule (100 mg total) by mouth 2 (two) times daily as needed for cough. 20 capsule 0   . Blood Glucose Monitoring Suppl (ACCU-CHEK AVIVA PLUS) W/DEVICE KIT 1  Device by Does not apply route once. 1 kit 0   . Blood Glucose Monitoring Suppl (PRECISION XTRA MONITOR) DEVI 1 Device by Does not apply route daily. 1 each 0   . glipiZIDE (GLUCOTROL) 10 MG tablet Take 1 tablet (10 mg total) by mouth daily before breakfast.     . lamoTRIgine (LAMICTAL) 25 MG tablet Take 1 tablet (25 mg total) by mouth 2 (two) times daily. 60 tablet 0   . lisinopril (PRINIVIL,ZESTRIL) 20 MG tablet Take 1 tablet (20 mg total) by mouth daily.     . metFORMIN (GLUCOPHAGE) 1000 MG tablet TAKE ONE TABLET BY MOUTH TWICE DAILY WITH MEAL 30 tablet 5   . metoprolol (LOPRESSOR) 50 MG tablet Take 0.5 tablets (25 mg total) by mouth 2 (two) times daily.     . sildenafil (REVATIO) 20 MG tablet TAKE 1 TO 3 TABLETS BY MOUTH EVERY DAY AS NEEDED erectile dysfunction 25 tablet 0   . sildenafil (VIAGRA) 25 MG tablet Take 1 tablet (25 mg total) by mouth daily as needed for erectile dysfunction. 10 tablet 2   . traZODone (DESYREL) 150 MG tablet Take 1 tablet (150 mg total) by mouth at bedtime as needed for sleep. 30 tablet 0     Musculoskeletal: Strength & Muscle Tone: within normal limits Gait & Station: normal slight limp to right side Patient leans: N/A  Psychiatric Specialty Exam: Physical Exam  Vitals reviewed. Constitutional: He is oriented to person, place, and time. He appears well-developed.  Cardiovascular: Normal rate.   Neurological: He is alert and oriented to person, place, and time.  Psychiatric: He has a normal mood and affect. His behavior is normal.    Review of Systems  Psychiatric/Behavioral: Positive for depression and suicidal ideas. The patient is nervous/anxious and has insomnia.     Blood pressure 121/81, pulse (!) 116, temperature 98 F (36.7 C), temperature source Oral, resp. rate 18, height 5' 7.5" (1.715 m), weight 100.7 kg (222 lb).Body mass index is 34.26 kg/m.  General Appearance: Casual and Guarded  Eye Contact:  Fair  Speech:  Clear and Coherent  Volume:   Decreased  Mood:  Depressed and Dysphoric  Affect:  Congruent  Thought Process:  Coherent  Orientation:  Full (Time, Place, and Person)  Thought Content:  Hallucinations: Visual intermittent hallucination of shadows reports mainly at night  Suicidal Thoughts:  Yes.  without intent/plan- patient is able to contract for safety  Homicidal Thoughts:  No  Memory:  Immediate;   Fair Recent;   Fair Remote;   Fair  Judgement:  Fair  Insight:  Present  Psychomotor Activity:  Normal  Concentration:  Concentration: Fair  Recall:  [employer] of Knowledge:  Fair  Language:  Good  Akathisia:  No  Handed:  Right  AIMS (if indicated):     Assets:  Communication Skills Desire for Improvement Social Support  ADL's:  Intact  Cognition:  WNL  Sleep:  Number of Hours: 6.25     I agree with current treatment plan on 10/10/2016, Patient seen face-to-face for psychiatric evaluation follow-up, chart reviewed and case  discussed with the MD Eappen. Reviewed the information documented and agree with the treatment plan.   Treatment Plan Summary: Daily contact with patient to assess and evaluate symptoms and progress in treatment and Medication management   Continue with Neurontin 100 mg, Lamictal 25 mg BID for mood stabilization. -consider titration with Lamictal   Continue with Trazodone 150 mg for insomnia Discontinue Ambien 5 mg for insomnia   Will continue to monitor vitals ,medication compliance and treatment side effects while patient is here.  Reviewed labs: ,BAL - , UDS - pos TSH -pending  CSW will start working on disposition.  Patient to participate in therapeutic milieu   Observation Level/Precautions:  15 minute checks  Laboratory:  CBC Chemistry Profile HbAIC UDS  Psychotherapy:  Individual and group session  Medications:  See above  Consultations:  Psychiatry  Discharge Concerns:  Safety, stabilization, and risk of access to medication and medication stabilization    Estimated LOS: 5-7days  Other:     Physician Treatment Plan for Primary Diagnosis: MDD (major depressive disorder), recurrent severe, without psychosis ([place]) Long Term Goal(s): Improvement in symptoms so as ready for discharge  Short Term Goals: Ability to identify changes in lifestyle to reduce recurrence of condition will improve, Ability to disclose and discuss suicidal ideas and Ability to demonstrate self-control will improve  Physician Treatment Plan for Secondary Diagnosis: Principal Problem:   MDD (major depressive disorder), recurrent severe, without psychosis ([place])  Long Term Goal(s): Improvement in symptoms so as ready for discharge  Short Term Goals: Ability to verbalize feelings will improve, Ability to maintain clinical measurements within normal limits will improve and Compliance with prescribed medications will improve  I certify that inpatient services furnished can reasonably be expected to improve the patient's condition.    Derrill [place], NP 8/26/201810:33 AM

## 2016-10-10 NOTE — Plan of Care (Signed)
Problem: Safety: Goal: Periods of time without injury will increase Outcome: Progressing Patient is on q15 minute safety checks and moderate fall risk precautions. Patient contracts for safety on the unit and remains safe at this time.

## 2016-10-11 LAB — HEMOGLOBIN A1C
HEMOGLOBIN A1C: 8.9 % — AB (ref 4.8–5.6)
MEAN PLASMA GLUCOSE: 208.73 mg/dL

## 2016-10-11 LAB — TSH: TSH: 1.484 u[IU]/mL (ref 0.350–4.500)

## 2016-10-11 LAB — GLUCOSE, CAPILLARY
GLUCOSE-CAPILLARY: 216 mg/dL — AB (ref 65–99)
GLUCOSE-CAPILLARY: 145 mg/dL — AB (ref 65–99)
Glucose-Capillary: 173 mg/dL — ABNORMAL HIGH (ref 65–99)
Glucose-Capillary: 133 mg/dL — ABNORMAL HIGH (ref 65–99)

## 2016-10-11 MED ORDER — LAMOTRIGINE 25 MG PO TABS
25.0000 mg | ORAL_TABLET | ORAL | Status: DC
  Administered 2016-10-12 – 2016-10-13 (×2): 25 mg via ORAL
  Filled 2016-10-11 (×4): qty 1

## 2016-10-11 MED ORDER — GABAPENTIN 100 MG PO CAPS
100.0000 mg | ORAL_CAPSULE | Freq: Two times a day (BID) | ORAL | Status: DC
  Administered 2016-10-11 – 2016-10-12 (×2): 100 mg via ORAL
  Filled 2016-10-11 (×4): qty 1

## 2016-10-11 MED ORDER — LAMOTRIGINE 25 MG PO TABS
50.0000 mg | ORAL_TABLET | Freq: Every day | ORAL | Status: DC
  Administered 2016-10-11 – 2016-10-12 (×2): 50 mg via ORAL
  Filled 2016-10-11 (×4): qty 2

## 2016-10-11 MED ORDER — MIRTAZAPINE 7.5 MG PO TABS
7.5000 mg | ORAL_TABLET | Freq: Every day | ORAL | Status: DC
  Administered 2016-10-11 – 2016-10-12 (×2): 7.5 mg via ORAL
  Filled 2016-10-11 (×4): qty 1

## 2016-10-11 NOTE — Plan of Care (Signed)
Problem: Safety: Goal: Periods of time without injury will increase Outcome: Progressing Patient is on q15 minute safety checks and low fall risk precautions. Patient contracts for safety on the unit and remains safe at this time.

## 2016-10-11 NOTE — Progress Notes (Signed)
Nursing Progress Note [id number]  D) Patient presents calm, pleasant and cooperative. Patient reports, "I am feeling a little better every day". Patient has a bright, animated affect and engages appropriately with Probation officer. Patient CBG monitored this evening. Patient compliant with medications. Patient denies concerns for writer this evening. Patient denies SI/HI/AVH or pain and contracts for safety on the unit.  A) Emotional support given. 1:1 interaction and active listening provided. Patient medicated as prescribed. Medications and plan of care reviewed with patient. Patient verbalized understanding without further questions. Snacks and fluids provided. Opportunities for questions or concerns presented to patient. Patient encouraged to continue to work on treatment goals. Labs, vital signs and patient behavior monitored throughout shift. Patient safety maintained with q15 min safety checks. Moderate fall risk precautions in place and reviewed with patient; patient verbalized understanding.  R) Patient receptive to interaction with nurse. Patient remains safe on the unit at this time. Patient denies any adverse medication reactions at this time. Patient is resting in bed without complaints. Will continue to monitor.

## 2016-10-11 NOTE — Progress Notes (Signed)
Recreation Therapy Notes  Date: 10/11/16 Time: 0915 Location: [address] Group Room  Group Topic: Stress Management  Goal Area(s) Addresses:  Patient will verbalize importance of using healthy stress management.  Patient will identify positive emotions associated with healthy stress management.   Behavioral Response: Engaged  Intervention: Stress Management  Activity :  Body Scan Meditation.  LRT introduced the stress management technique of meditation.  LRT played a meditation from the Calm app to allow patients to take inventory of any tension, calmness or other sensations they may have been feeling.  Patients were to follow along as the meditation played.  Education:  Stress Management, Discharge Planning.   Education Outcome: Acknowledges edcuation/In group clarification offered/Needs additional education  Clinical Observations/Feedback: Pt attended group.   Victorino Sparrow, LRT/CTRS         Ria Comment, Myna Freimark A 10/11/2016 11:45 AM

## 2016-10-11 NOTE — Tx Team (Signed)
Interdisciplinary Treatment and Diagnostic Plan Update  10/12/2016 Time of Session: 9:30am [name] MRN: [id number]  Principal Diagnosis: MDD (major depressive disorder), recurrent severe, without psychosis ([name])  Secondary Diagnoses: Principal Problem:   MDD (major depressive disorder), recurrent severe, without psychosis ([name]) Active Problems:   Post traumatic stress disorder (PTSD)   Current Medications:  Current Facility-Administered Medications  Medication Dose Route Frequency Provider Last Rate Last Dose  . acetaminophen (TYLENOL) tablet 650 mg  650 mg Oral Q4H PRN Okonkwo, Justina A, NP   650 mg at 10/11/16 0846  . alum & mag hydroxide-simeth (MAALOX/MYLANTA) 200-200-20 MG/5ML suspension 30 mL  30 mL Oral Q6H PRN Okonkwo, Justina A, NP      . atorvastatin (LIPITOR) tablet 40 mg  40 mg Oral q1800 Okonkwo, Justina A, NP   40 mg at 10/11/16 1721  . gabapentin (NEURONTIN) capsule 100 mg  100 mg Oral BID Cobos, Myer Peer, MD   100 mg at 10/12/16 0854  . glipiZIDE (GLUCOTROL) tablet 10 mg  10 mg Oral QAC breakfast Okonkwo, Justina A, NP   10 mg at 10/12/16 0626  . hydrOXYzine (ATARAX/VISTARIL) tablet 25 mg  25 mg Oral TID PRN Hughie Closs A, NP   25 mg at 10/11/16 0846  . insulin aspart (novoLOG) injection 0-15 Units  0-15 Units Subcutaneous TID WC Derrill Center, NP   3 Units at 10/12/16 [phone]  . insulin glargine (LANTUS) injection 10 Units  10 Units Subcutaneous QHS Derrill Center, NP   10 Units at 10/11/16 2125  . lamoTRIgine (LAMICTAL) tablet 25 mg  25 mg Oral BH-q7a Cobos, Myer Peer, MD   25 mg at 10/12/16 4174  . lamoTRIgine (LAMICTAL) tablet 50 mg  50 mg Oral QHS Cobos, Myer Peer, MD   50 mg at 10/11/16 2125  . lisinopril (PRINIVIL,ZESTRIL) tablet 20 mg  20 mg Oral Daily Okonkwo, Justina A, NP   20 mg at 10/12/16 0854  . magnesium hydroxide (MILK OF MAGNESIA) suspension 30 mL  30 mL Oral Daily PRN Okonkwo, Justina A, NP      . metFORMIN (GLUCOPHAGE) tablet 1,000 mg   1,000 mg Oral BID WC Okonkwo, Justina A, NP   1,000 mg at 10/12/16 0855  . metoprolol tartrate (LOPRESSOR) tablet 25 mg  25 mg Oral BID Okonkwo, Justina A, NP   25 mg at 10/12/16 0854  . mirtazapine (REMERON) tablet 7.5 mg  7.5 mg Oral QHS Cobos, Fernando A, MD   7.5 mg at 10/11/16 2125  . nicotine (NICODERM CQ - dosed in mg/24 hours) patch 21 mg  21 mg Transdermal Daily Okonkwo, Justina A, NP   21 mg at 10/12/16 0855  . ondansetron (ZOFRAN) tablet 4 mg  4 mg Oral Q8H PRN Okonkwo, Justina A, NP        PTA Medications: Prescriptions Prior to Admission  Medication Sig Dispense Refill Last Dose  . ARIPiprazole (ABILIFY) 20 MG tablet Take 30 mg by mouth every morning.     Marland Kitchen atorvastatin (LIPITOR) 40 MG tablet Take 1 tablet (40 mg total) by mouth daily. (Patient taking differently: Take 80 mg by mouth daily. )     . DULoxetine (CYMBALTA) 20 MG capsule Take 20 mg by mouth daily.     Marland Kitchen gabapentin (NEURONTIN) 800 MG tablet Take 800 mg by mouth 3 times/day as needed-between meals & bedtime (right knee pain).     Marland Kitchen glipiZIDE (GLUCOTROL) 10 MG tablet Take 1 tablet (10 mg total) by mouth daily before breakfast. (  Patient taking differently: Take 10 mg by mouth 2 (two) times daily before a meal. )     . lamoTRIgine (LAMICTAL) 25 MG tablet Take 1 tablet (25 mg total) by mouth 2 (two) times daily. (Patient taking differently: Take 300 mg by mouth 2 (two) times daily. ) 60 tablet 0   . lisinopril (PRINIVIL,ZESTRIL) 20 MG tablet Take 1 tablet (20 mg total) by mouth daily.     . metFORMIN (GLUCOPHAGE) 1000 MG tablet TAKE ONE TABLET BY MOUTH TWICE DAILY WITH MEAL 30 tablet 5   . omeprazole (PRILOSEC) 20 MG capsule Take 20 mg by mouth daily.     Vladimir Faster Glycol-Propyl Glycol (SYSTANE OP) Apply 1 drop to eye 4 (four) times daily.     . sildenafil (VIAGRA) 25 MG tablet Take 1 tablet (25 mg total) by mouth daily as needed for erectile dysfunction. (Patient taking differently: Take 150 mg by mouth daily as needed for  erectile dysfunction. ) 10 tablet 2   . traZODone (DESYREL) 150 MG tablet Take 1 tablet (150 mg total) by mouth at bedtime as needed for sleep. (Patient taking differently: Take 50 mg by mouth at bedtime as needed for sleep. ) 30 tablet 0   . hydrochlorothiazide (HYDRODIURIL) 25 MG tablet Take 25 mg by mouth daily.     . insulin glargine (LANTUS) 100 UNIT/ML injection Inject 50 Units into the skin at bedtime.     . metoprolol (LOPRESSOR) 50 MG tablet Take 0.5 tablets (25 mg total) by mouth 2 (two) times daily.    at [facility]  . sildenafil (REVATIO) 20 MG tablet TAKE 1 TO 3 TABLETS BY MOUTH EVERY DAY AS NEEDED erectile dysfunction 25 tablet 0  at [facility]    Treatment Modalities: Medication Management, Group therapy, Case management,  1 to 1 session with clinician, Psychoeducation, Recreational therapy.  Patient Stressors: Health problems Loss of Mother (December 2017) Traumatic event  Patient Strengths: Capable of independent living Curator fund of knowledge Motivation for treatment/growth  Physician Treatment Plan for Primary Diagnosis: MDD (major depressive disorder), recurrent severe, without psychosis ([place]) Long Term Goal(s): Improvement in symptoms so as ready for discharge  Short Term Goals: Ability to identify changes in lifestyle to reduce recurrence of condition will improve Ability to disclose and discuss suicidal ideas Ability to demonstrate self-control will improve Ability to verbalize feelings will improve Ability to maintain clinical measurements within normal limits will improve Compliance with prescribed medications will improve  Medication Management: Evaluate patient's response, side effects, and tolerance of medication regimen.  Therapeutic Interventions: 1 to 1 sessions, Unit Group sessions and Medication administration.  Evaluation of Outcomes: Not Met  Physician Treatment Plan for Secondary Diagnosis: Principal Problem:   MDD (major depressive  disorder), recurrent severe, without psychosis ([place]) Active Problems:   Post traumatic stress disorder (PTSD)   Long Term Goal(s): Improvement in symptoms so as ready for discharge  Short Term Goals: Ability to identify changes in lifestyle to reduce recurrence of condition will improve Ability to disclose and discuss suicidal ideas Ability to demonstrate self-control will improve Ability to verbalize feelings will improve Ability to maintain clinical measurements within normal limits will improve Compliance with prescribed medications will improve  Medication Management: Evaluate patient's response, side effects, and tolerance of medication regimen.  Therapeutic Interventions: 1 to 1 sessions, Unit Group sessions and Medication administration.  Evaluation of Outcomes: Not Met   RN Treatment Plan for Primary Diagnosis: MDD (major depressive disorder), recurrent severe, without psychosis ([place]) Long  Term Goal(s): Knowledge of disease and therapeutic regimen to maintain health will improve  Short Term Goals: Ability to participate in decision making will improve, Ability to identify and develop effective coping behaviors will improve and Compliance with prescribed medications will improve  Medication Management: RN will administer medications as ordered by provider, will assess and evaluate patient's response and provide education to patient for prescribed medication. RN will report any adverse and/or side effects to prescribing provider.  Therapeutic Interventions: 1 on 1 counseling sessions, Psychoeducation, Medication administration, Evaluate responses to treatment, Monitor vital signs and CBGs as ordered, Perform/monitor CIWA, COWS, AIMS and Fall Risk screenings as ordered, Perform wound care treatments as ordered.  Evaluation of Outcomes: Not Met   LCSW Treatment Plan for Primary Diagnosis: MDD (major depressive disorder), recurrent severe, without psychosis ([place]) Long Term  Goal(s): Safe transition to appropriate next level of care at discharge, Engage patient in therapeutic group addressing interpersonal concerns.  Short Term Goals: Engage patient in aftercare planning with referrals and resources, Identify triggers associated with mental health/substance abuse issues and Increase skills for wellness and recovery  Therapeutic Interventions: Assess for all discharge needs, 1 to 1 time with Social worker, Explore available resources and support systems, Assess for adequacy in community support network, Educate family and significant other(s) on suicide prevention, Complete Psychosocial Assessment, Interpersonal group therapy.  Evaluation of Outcomes: Not Met   Progress in Treatment: Attending groups: Yes Participating in groups: Yes  Taking medication as prescribed: Yes, MD continues to assess for medication changes as needed Toleration medication: Yes, no side effects reported at this time Family/Significant other contact made: No, Pt declines Patient understands diagnosis: Continuing to assess Discussing patient identified problems/goals with staff: Yes Medical problems stabilized or resolved: Yes Denies suicidal/homicidal ideation: Yes Issues/concerns per patient self-inventory: None Other: N/A  New problem(s) identified: None identified at this time.   New Short Term/Long Term Goal(s): "feel better and think more positive"  Discharge Plan or Barriers: Pt will return home and follow-up [facility] the [facility]  Reason for Continuation of Hospitalization: Anxiety Depression Medication stabilization  Estimated Length of Stay: 3-5 days; est DC date 8/31  Attendees: Patient: Joncarlo Friberg  10/11/2016  9:59 AM  Physician:  10/11/2016 9:59 AM  Nursing: Tamela Oddi, RN 10/11/2016  9:59 AM  RN Care Manager:  10/11/2016  9:59 AM  Social Worker: Adriana Reams, LCSW 10/11/2016  9:59 AM  Recreational Therapist:  10/11/2016  9:59 AM  Other:   10/11/2016  9:59 AM  Other:  10/11/2016  9:59 AM  Other: 10/11/2016  9:59 AM    Scribe for Treatment Team: Gladstone Lighter, LCSW 10/12/2016 9:59 AM

## 2016-10-11 NOTE — Social Work (Signed)
Referred to [facility] Team, is [place] resident.  Kevin Arrazola, LCSW Lead Clinical Social Worker Phone:  [phone]  

## 2016-10-11 NOTE — Progress Notes (Signed)
Patient ID: [name], male   DOB: 11/09/1955, 61 y.o.   MRN: [id number] Patient denies SI/HI/AVH this morning, reports feeling depressed, rates his depression as 6/10, reports anxiety as 6/010(with 10 being worse), and rates his hopelessness level as 6/10.  Patient complained of right leg pain of 6/10, was medicated with Tylenol 650mg , and also medicated with Atarax 25mg  for anxiety.  Pt took all other meds as scheduled, states his energy level is good, and that he slept well last night, and that his appetite is good.  Q15 minute checks in place, will continue to monitor.

## 2016-10-11 NOTE — Plan of Care (Signed)
Problem: Safety: Goal: Periods of time without injury will increase Outcome: Progressing Patient is on q15 minute safety checks and moderate fall risk precautions. Patient contracts for safety on the unit and remains safe at this time.

## 2016-10-11 NOTE — Progress Notes (Signed)
[facility] MD Progress Note  10/11/2016 1:58 PM [name]  MRN:  [id number] Subjective:  Reports he still feels depressed, but is feeling "  A little better". Denies any suicidal ideations, denies psychotic symptoms. Objective: I have discussed case with staff and have met with patient. He is a 61 year old male, lives alone, on disability. He reports feeling depressed over recent months, particularly after his mother passed away several months ago. Presented for depression, SI. Reports prior history of Bipolar Disorder and of PTSD  Today reports he is feeling better, less severely depressed and denies suicidal ideations. Presents vaguely depressed, sad, but affect is reactive.  Visible in  day room, going to groups. Behavior on unit in good control. Denies any medication side effects at this time.   Principal Problem: MDD (major depressive disorder), recurrent severe, without psychosis ([place]) Diagnosis:   Patient Active Problem List   Diagnosis Date Noted  . MDD (major depressive disorder), recurrent severe, without psychosis ([place]) [F33.2] 10/09/2016  . Diabetes type 2, controlled ([place]) [E11.9] 01/01/2015  . Erectile dysfunction [N52.9] 01/01/2015  . Healthcare maintenance [Z00.00] 01/01/2015  . Bipolar 1 disorder, depressed ([place]) [F31.9] 07/23/2013  . Bipolar disorder ([place]) [F31.9] 04/03/2013  . MDD (major depressive disorder), recurrent, severe, with psychosis ([place]) [F33.3] 11/04/2012  . Suicidal ideation [R45.851] 11/04/2012  . Post traumatic stress disorder (PTSD) [F43.10] 11/04/2012  . CARCINOMA, RENAL CELL [C64.9] 06/05/2008  . HYPERLIPIDEMIA [E78.5] 06/05/2008  . HYPERTENSION [I10] 06/05/2008  . CHEST PAIN [R07.9] 06/05/2008   Total Time spent with patient: 20 minutes   Past Medical History:  Past Medical History:  Diagnosis Date  . Arthritis   . Cancer ([place])   . Depression   . Diabetes mellitus   . GERD (gastroesophageal reflux disease)   . H/O suicide attempt 1997 and  2011   jumped out of moving vehicle and attempted to jump into moving traffic  . High cholesterol   . Hypertension   . Neuropathic pain     Past Surgical History:  Procedure Laterality Date  . NEPHRECTOMY     due to cancer   Family History:  Family History  Problem Relation Age of Onset  . Alcohol abuse Sister   . 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 Marital status: Single    Spouse name: N/A  . Number of children: N/A  . Years of education: N/A   Social History Main Topics  . Smoking status: Current Every Day Smoker    Packs/day: 0.50    Years: 20.00    Types: Cigarettes  . Smokeless tobacco: Never Used  . 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 Drug use: No  . Sexual activity: Yes    Birth control/ protection: Condom   Other Topics Concern  . None   Social History Narrative  . None   Additional Social History:   Sleep: improving   Appetite:  improving   Current Medications: Current Facility-Administered Medications  Medication Dose Route Frequency Provider Last Rate Last Dose  . acetaminophen (TYLENOL) tablet 650 mg  650 mg Oral Q4H PRN Okonkwo, Justina A, NP   650 mg at 10/11/16 0846  . alum & mag hydroxide-simeth (MAALOX/MYLANTA) 200-200-20 MG/5ML suspension 30  mL  30 mL Oral Q6H PRN Okonkwo, Justina A, NP      . atorvastatin (LIPITOR) tablet 40 mg  40 mg Oral q1800 Okonkwo, Justina A, NP   40 mg at 10/10/16 2125  . gabapentin (NEURONTIN) capsule 100 mg  100 mg Oral QHS Okonkwo, Justina A, NP   100 mg at 10/10/16 2125  . glipiZIDE (GLUCOTROL) tablet 10 mg  10 mg Oral QAC breakfast Okonkwo, Justina A, NP   10 mg at 10/11/16 0634  . hydrOXYzine (ATARAX/VISTARIL) tablet 25 mg  25 mg Oral TID PRN Hughie Closs A, NP   25 mg at 10/11/16 0846  . insulin aspart (novoLOG) injection 0-15 Units  0-15  Units Subcutaneous TID WC Derrill Center, NP   2 Units at 10/11/16 1213  . insulin glargine (LANTUS) injection 10 Units  10 Units Subcutaneous QHS Derrill Center, NP   10 Units at 10/10/16 2129  . lamoTRIgine (LAMICTAL) tablet 25 mg  25 mg Oral BID Okonkwo, Justina A, NP   25 mg at 10/11/16 0847  . lisinopril (PRINIVIL,ZESTRIL) tablet 20 mg  20 mg Oral Daily Okonkwo, Justina A, NP   20 mg at 10/11/16 0847  . magnesium hydroxide (MILK OF MAGNESIA) suspension 30 mL  30 mL Oral Daily PRN Okonkwo, Justina A, NP      . metFORMIN (GLUCOPHAGE) tablet 1,000 mg  1,000 mg Oral BID WC Okonkwo, Justina A, NP   1,000 mg at 10/11/16 0846  . metoprolol tartrate (LOPRESSOR) tablet 25 mg  25 mg Oral BID Okonkwo, Justina A, NP   25 mg at 10/11/16 0846  . nicotine (NICODERM CQ - dosed in mg/24 hours) patch 21 mg  21 mg Transdermal Daily Okonkwo, Justina A, NP   21 mg at 10/11/16 0851  . ondansetron (ZOFRAN) tablet 4 mg  4 mg Oral Q8H PRN Okonkwo, Justina A, NP      . traZODone (DESYREL) tablet 150 mg  150 mg Oral QHS PRN Lu Duffel, Justina A, NP   150 mg at 10/10/16 2125    Lab Results:  Results for orders placed or performed during the hospital encounter of 10/09/16 (from the past 48 hour(s))  Glucose, capillary     Status: Abnormal   Collection Time: 10/09/16  4:55 PM  Result Value Ref Range   Glucose-Capillary 129 (H) 65 - 99 mg/dL   Comment 1 Notify RN    Comment 2 Document in Chart   Glucose, capillary     Status: Abnormal   Collection Time: 10/10/16  5:58 AM  Result Value Ref Range   Glucose-Capillary 171 (H) 65 - 99 mg/dL  Glucose, capillary     Status: Abnormal   Collection Time: 10/10/16 11:47 AM  Result Value Ref Range   Glucose-Capillary 184 (H) 65 - 99 mg/dL   Comment 1 Notify RN    Comment 2 Document in Chart   Glucose, capillary     Status: Abnormal   Collection Time: 10/10/16  4:51 PM  Result Value Ref Range   Glucose-Capillary 243 (H) 65 - 99 mg/dL   Comment 1 Notify RN    Comment 2  Document in Chart   Glucose, capillary     Status: Abnormal   Collection Time: 10/10/16  9:25 PM  Result Value Ref Range   Glucose-Capillary 140 (H) 65 - 99 mg/dL  Glucose, capillary     Status: Abnormal   Collection Time: 10/11/16  5:25 AM  Result Value Ref Range   Glucose-Capillary 173 (  H) 65 - 99 mg/dL  Hemoglobin A1c     Status: Abnormal   Collection Time: 10/11/16  6:13 AM  Result Value Ref Range   Hgb A1c MFr Bld 8.9 (H) 4.8 - 5.6 %    Comment: (NOTE) Pre diabetes:          5.7%-6.4% Diabetes:              >6.4% Glycemic control for   <7.0% adults with diabetes    Mean Plasma Glucose 208.73 mg/dL    Comment: Performed at [facility] [address], [place], [place] [address]  TSH     Status: None   Collection Time: 10/11/16  6:13 AM  Result Value Ref Range   TSH 1.484 0.350 - 4.500 uIU/mL    Comment: Performed by a 3rd Generation assay with a functional sensitivity of <=0.01 uIU/mL. Performed at [facility], [place] [address] [place], [place] [address]   Glucose, capillary     Status: Abnormal   Collection Time: 10/11/16 12:02 PM  Result Value Ref Range   Glucose-Capillary 133 (H) 65 - 99 mg/dL   Comment 1 Notify RN     Blood Alcohol level:  Lab Results  Component Value Date   ETH <5 10/08/2016   ETH <11 54/27/0623    Metabolic Disorder Labs: Lab Results  Component Value Date   HGBA1C 8.9 (H) 10/11/2016   MPG 208.73 10/11/2016   MPG 194 (H) 04/03/2013   No results found for: PROLACTIN Lab Results  Component Value Date   CHOL  05/04/2008    127        ATP III CLASSIFICATION:  <200     mg/dL   Desirable  200-239  mg/dL   Borderline High  >=240    mg/dL   High          TRIG 90 05/04/2008   HDL 27 (L) 05/04/2008   CHOLHDL 4.7 05/04/2008   VLDL 18 05/04/2008   LDLCALC  05/04/2008    82        Total Cholesterol/HDL:CHD Risk Coronary Heart Disease Risk Table                     Men   Women  1/2 Average Risk   3.4   3.3   Average Risk       5.0   4.4  2 X Average Risk   9.6   7.1  3 X Average Risk  23.4   11.0        Use the calculated Patient Ratio above and the CHD Risk Table to determine the patient's CHD Risk.        ATP III CLASSIFICATION (LDL):  <100     mg/dL   Optimal  100-129  mg/dL   Near or Above                    Optimal  130-159  mg/dL   Borderline  160-189  mg/dL   High  >190     mg/dL   Very High    Physical Findings: AIMS: Facial and Oral Movements Muscles of Facial Expression: None, normal Lips and Perioral Area: None, normal Jaw: None, normal Tongue: None, normal,Extremity Movements Upper (arms, wrists, hands, fingers): None, normal Lower (legs, knees, ankles, toes): None, normal, Trunk Movements Neck, shoulders, hips: None, normal, Overall Severity Severity of abnormal movements (highest score from questions above): None, normal Incapacitation due to abnormal  movements: None, normal Patient's awareness of abnormal movements (rate only patient's report): No Awareness, Dental Status Current problems with teeth and/or dentures?: No Does patient usually wear dentures?: No  CIWA:    COWS:     Musculoskeletal: Strength & Muscle Tone: within normal limits Gait & Station: normal Patient leans: N/A  Psychiatric Specialty Exam: Physical Exam  ROS mild headache, no chest pain, no shortness of breath, no nausea, no vomiting   Blood pressure 111/81, pulse (!) 109, temperature 98.4 F (36.9 C), temperature source Oral, resp. rate 16, height 5' 7.5" (1.715 m), weight 100.7 kg (222 lb).Body mass index is 34.26 kg/m.  General Appearance: Fairly Groomed  Eye Contact:  Good  Speech:  Slow  Volume:  Decreased  Mood:  reports still depressed, but feeling better  Affect:  constricted but reactive, smiles at times appropriately   Thought Process:  Linear and Descriptions of Associations: Intact  Orientation:  Other:  fully alert and attentive   Thought Content:  denies hallucinations,  no delusions, not internally preoccupied   Suicidal Thoughts:  No denies any suicidal or self injurious ideations, denies homicidal or violent ideations   Homicidal Thoughts:  No  Memory:  recent and remote grossly intact   Judgement:  Other:  improving   Insight:  improving   Psychomotor Activity:  Normal  Concentration:  Concentration: Good and Attention Span: Good  Recall:  Good  Fund of Knowledge:  Good  Language:  Good  Akathisia:  Negative  Handed:  Right  AIMS (if indicated):     Assets:  Communication Skills Desire for Improvement Resilience  ADL's:  Intact  Cognition:  WNL  Sleep:  Number of Hours: 6   Assessment - 61 year old male, reports history of Bipolar Disorder,and describes episodes suggestive of hypomania. Reports worsening depression, sadness, particularly after mother's death several months ago. At this time feeling better- presents depressed, subdued, but affect is reactive. No current SI , no psychotic symptoms. Denies medication side effects thus far, although states feels vaguely dizzy after taking Trazodone   Treatment Plan Summary: Daily contact with patient to assess and evaluate symptoms and progress in treatment, Medication management, Plan inpatient treatment  and medications as below Encourage group and milieu participation to work on coping skills and symptom reduction Increase Lamictal to 25 mgrs QAM and 50 mgrs QHS - states he has been on 25 mgrs BID for " several months", and denies side effects.  Start Remeron 7.5 mgrs QHS for insomnia and depression  Increase Neurontin to 100 mgrs BID for anxiety, pain D/C Trazodone-see above Treatment team working on disposition planning options   Jenne Campus, MD 10/11/2016, 1:58 PM

## 2016-10-11 NOTE — Progress Notes (Signed)
Inpatient Diabetes Program Recommendations  AACE/ADA: New Consensus Statement on Inpatient Glycemic Control (2015)  Target Ranges:  Prepandial:   less than 140 mg/dL      Peak postprandial:   less than 180 mg/dL (1-2 hours)      Critically ill patients:  140 - 180 mg/dL   Lab Results  Component Value Date   GLUCAP 173 (H) 10/11/2016   HGBA1C 8.9 (H) 10/11/2016    Review of Glycemic Control  Diabetes history: DM2 Outpatient Diabetes medications: metformin 1000 mg bid, glipizide 10 mg QAM Current orders for Inpatient glycemic control: Lantus 10 units QHS, Novolog 0-15 units tidwc, metformin 1000 mg bid, glipizide 10 mg QAM  HgbA1C - 8.9%. Pt reports taking metformin 1x/day instead of 2x/day. Needs tighter control.  Inpatient Diabetes Program Recommendations:   Add HS correction. If post-prandial blood sugars > 180 mg/dL, add Novolog 3 units tidwc.  Will follow.   Thank you. Lorenda Peck, RD, LDN, CDE Inpatient Diabetes Coordinator [phone]

## 2016-10-11 NOTE — BHH Group Notes (Signed)
LCSW Group Therapy Note   10/11/2016 1:15pm   Type of Therapy and Topic:  Group Therapy:  Overcoming Obstacles   Participation Level:  Minimal   Description of Group:    In this group patients will be encouraged to explore what they see as obstacles to their own wellness and recovery. They will be guided to discuss their thoughts, feelings, and behaviors related to these obstacles. The group will process together ways to cope with barriers, with attention given to specific choices patients can make. Each patient will be challenged to identify changes they are motivated to make in order to overcome their obstacles. This group will be process-oriented, with patients participating in exploration of their own experiences as well as giving and receiving support and challenge from other group members.   Therapeutic Goals: 1. Patient will identify personal and current obstacles as they relate to admission. 2. Patient will identify barriers that currently interfere with their wellness or overcoming obstacles.  3. Patient will identify feelings, thought process and behaviors related to these barriers. 4. Patient will identify two changes they are willing to make to overcome these obstacles:      Summary of Patient Progress Pt was attentive and encouraging of peers. Pt discussed his own obstacles including grief related to his mother's death     Therapeutic Modalities:   Cognitive Behavioral Therapy Solution Focused Therapy Motivational Interviewing Relapse Prevention Therapy  Gladstone Lighter, LCSW 10/11/2016 5:00 PM

## 2016-10-12 LAB — GLUCOSE, CAPILLARY
GLUCOSE-CAPILLARY: 200 mg/dL — AB (ref 65–99)
GLUCOSE-CAPILLARY: 136 mg/dL — AB (ref 65–99)
GLUCOSE-CAPILLARY: 172 mg/dL — AB (ref 65–99)
GLUCOSE-CAPILLARY: 185 mg/dL — AB (ref 65–99)

## 2016-10-12 MED ORDER — GABAPENTIN 100 MG PO CAPS
100.0000 mg | ORAL_CAPSULE | Freq: Three times a day (TID) | ORAL | Status: DC
  Administered 2016-10-12 – 2016-10-13 (×4): 100 mg via ORAL
  Filled 2016-10-12 (×9): qty 1

## 2016-10-12 NOTE — Progress Notes (Signed)
Nursing Note 10/12/2016 [id number]  Data Reports sleeping good without PRN sleep med.  Rates depression 5/10, hopelessness 4/10, and anxiety 6/10. Affect appropriate.  Denies HI, SI, AVH.  Reports improvement in mood since admission.  States he feels ready to discharge tomorrow.  Attending groups, spends a lot of free time in room but seen in milieu occasionally.   Action Spoke with patient 1:1, nurse offered support to patient throughout shift.  Continues to be monitored on 15 minute checks for safety.  Response Remains safe on unit.

## 2016-10-12 NOTE — Progress Notes (Signed)
[facility] MD Progress Note  10/12/2016 9:07 AM Akhilesh Sassone  MRN:  [id number] Subjective:  Patient reports partial improvement- reports ongoing depression, but states " I do feel better than when I was admitted ". States " I am trying to remain positive ".  Denies suicidal ideations at this time. Denies medication side effects- thus far tolerating Remeron trial well, states he slept "OK".  Objective: I have discussed case with staff and have met with patient. Patient presents with partial improvement compared to admission- reports improving mood and affect is more reactive. No disruptive or agitated behaviors on unit, visible in day room. Going to groups. Denies medication side effects- currently on Remeron , Neurontin,and Lamictal.  As he improves he is becoming more future oriented, and today we spoke about disposition planning options- he states he plans to return home, concerned about how he will get home and what transportation may be available . Plans to follow up at [facility] in [place].    Principal Problem: MDD (major depressive disorder), recurrent severe, without psychosis ([place]) Diagnosis:   Patient Active Problem List   Diagnosis Date Noted  . MDD (major depressive disorder), recurrent severe, without psychosis ([place]) [F33.2] 10/09/2016  . Diabetes type 2, controlled ([place]) [E11.9] 01/01/2015  . Erectile dysfunction [N52.9] 01/01/2015  . Healthcare maintenance [Z00.00] 01/01/2015  . Bipolar 1 disorder, depressed ([place]) [F31.9] 07/23/2013  . Bipolar disorder ([place]) [F31.9] 04/03/2013  . MDD (major depressive disorder), recurrent, severe, with psychosis ([place]) [F33.3] 11/04/2012  . Suicidal ideation [R45.851] 11/04/2012  . Post traumatic stress disorder (PTSD) [F43.10] 11/04/2012  . CARCINOMA, RENAL CELL [C64.9] 06/05/2008  . HYPERLIPIDEMIA [E78.5] 06/05/2008  . HYPERTENSION [I10] 06/05/2008  . CHEST PAIN [R07.9] 06/05/2008   Total Time spent with patient: 20  minutes   Past Medical History:  Past Medical History:  Diagnosis Date  . Arthritis   . Cancer ([place])   . Depression   . Diabetes mellitus   . GERD (gastroesophageal reflux disease)   . H/O suicide attempt 1997 and 2011   jumped out of moving vehicle and attempted to jump into moving traffic  . High cholesterol   . Hypertension   . Neuropathic pain     Past Surgical History:  Procedure Laterality Date  . NEPHRECTOMY     due to cancer   Family History:  Family History  Problem Relation Age of Onset  . Alcohol abuse Sister   . 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 Marital status: Single    Spouse name: N/A  . Number of children: N/A  . Years of education: N/A   Social History Main Topics  . Smoking status: Current Every Day Smoker    Packs/day: 0.50    Years: 20.00    Types: Cigarettes  . Smokeless tobacco: Never Used  . 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 Drug use: No  . Sexual activity: Yes    Birth control/ protection: Condom   Other Topics Concern  . None   Social History Narrative  . None   Additional Social History:   Sleep: improving   Appetite:  improving   Current Medications: Current Facility-Administered Medications  Medication Dose Route Frequency Provider Last Rate Last  Dose  . acetaminophen (TYLENOL) tablet 650 mg  650 mg Oral Q4H PRN Okonkwo, Justina A, NP   650 mg at 10/11/16 0846  . alum & mag hydroxide-simeth (MAALOX/MYLANTA) 200-200-20 MG/5ML suspension 30 mL  30 mL Oral Q6H PRN Okonkwo, Justina A, NP      . atorvastatin (LIPITOR) tablet 40 mg  40 mg Oral q1800 Okonkwo, Justina A, NP   40 mg at 10/11/16 1721  . gabapentin (NEURONTIN) capsule 100 mg  100 mg Oral BID Elzora Cullins, Myer Peer, MD   100 mg at 10/12/16 0854  . glipiZIDE (GLUCOTROL) tablet 10 mg  10 mg Oral QAC  breakfast Okonkwo, Justina A, NP   10 mg at 10/12/16 0626  . hydrOXYzine (ATARAX/VISTARIL) tablet 25 mg  25 mg Oral TID PRN Hughie Closs A, NP   25 mg at 10/11/16 0846  . insulin aspart (novoLOG) injection 0-15 Units  0-15 Units Subcutaneous TID WC Derrill Center, NP   3 Units at 10/12/16 [phone]  . insulin glargine (LANTUS) injection 10 Units  10 Units Subcutaneous QHS Derrill Center, NP   10 Units at 10/11/16 2125  . lamoTRIgine (LAMICTAL) tablet 25 mg  25 mg Oral BH-q7a Fatima Fedie, Myer Peer, MD   25 mg at 10/12/16 2426  . lamoTRIgine (LAMICTAL) tablet 50 mg  50 mg Oral QHS Mari Battaglia, Myer Peer, MD   50 mg at 10/11/16 2125  . lisinopril (PRINIVIL,ZESTRIL) tablet 20 mg  20 mg Oral Daily Okonkwo, Justina A, NP   20 mg at 10/12/16 0854  . magnesium hydroxide (MILK OF MAGNESIA) suspension 30 mL  30 mL Oral Daily PRN Okonkwo, Justina A, NP      . metFORMIN (GLUCOPHAGE) tablet 1,000 mg  1,000 mg Oral BID WC Okonkwo, Justina A, NP   1,000 mg at 10/12/16 0855  . metoprolol tartrate (LOPRESSOR) tablet 25 mg  25 mg Oral BID Okonkwo, Justina A, NP   25 mg at 10/12/16 0854  . mirtazapine (REMERON) tablet 7.5 mg  7.5 mg Oral QHS Madailein Londo A, MD   7.5 mg at 10/11/16 2125  . nicotine (NICODERM CQ - dosed in mg/24 hours) patch 21 mg  21 mg Transdermal Daily Okonkwo, Justina A, NP   21 mg at 10/12/16 0855  . ondansetron (ZOFRAN) tablet 4 mg  4 mg Oral Q8H PRN Lu Duffel, Justina A, NP        Lab Results:  Results for orders placed or performed during the hospital encounter of 10/09/16 (from the past 48 hour(s))  Glucose, capillary     Status: Abnormal   Collection Time: 10/10/16 11:47 AM  Result Value Ref Range   Glucose-Capillary 184 (H) 65 - 99 mg/dL   Comment 1 Notify RN    Comment 2 Document in Chart   Glucose, capillary     Status: Abnormal   Collection Time: 10/10/16  4:51 PM  Result Value Ref Range   Glucose-Capillary 243 (H) 65 - 99 mg/dL   Comment 1 Notify RN    Comment 2 Document in Chart    Glucose, capillary     Status: Abnormal   Collection Time: 10/10/16  9:25 PM  Result Value Ref Range   Glucose-Capillary 140 (H) 65 - 99 mg/dL  Glucose, capillary     Status: Abnormal   Collection Time: 10/11/16  5:25 AM  Result Value Ref Range   Glucose-Capillary 173 (H) 65 - 99 mg/dL  Hemoglobin A1c     Status: Abnormal   Collection Time: 10/11/16  6:13 AM  Result Value Ref Range   Hgb A1c MFr Bld 8.9 (H) 4.8 - 5.6 %    Comment: (NOTE) Pre diabetes:          5.7%-6.4% Diabetes:              >6.4% Glycemic control for   <7.0% adults with diabetes    Mean Plasma Glucose 208.73 mg/dL    Comment: Performed at [facility] [address] [place], [place] [address]  TSH     Status: None   Collection Time: 10/11/16  6:13 AM  Result Value Ref Range   TSH 1.484 0.350 - 4.500 uIU/mL    Comment: Performed by a 3rd Generation assay with a functional sensitivity of <=0.01 uIU/mL. Performed at [facility], [place] [address], [place], [place] [address]   Glucose, capillary     Status: Abnormal   Collection Time: 10/11/16 12:02 PM  Result Value Ref Range   Glucose-Capillary 133 (H) 65 - 99 mg/dL   Comment 1 Notify RN   Glucose, capillary     Status: Abnormal   Collection Time: 10/11/16  5:11 PM  Result Value Ref Range   Glucose-Capillary 216 (H) 65 - 99 mg/dL  Glucose, capillary     Status: Abnormal   Collection Time: 10/11/16  8:34 PM  Result Value Ref Range   Glucose-Capillary 145 (H) 65 - 99 mg/dL  Glucose, capillary     Status: Abnormal   Collection Time: 10/12/16  5:45 AM  Result Value Ref Range   Glucose-Capillary 200 (H) 65 - 99 mg/dL    Blood Alcohol level:  Lab Results  Component Value Date   ETH <5 10/08/2016   ETH <11 56/38/7564    Metabolic Disorder Labs: Lab Results  Component Value Date   HGBA1C 8.9 (H) 10/11/2016   MPG 208.73 10/11/2016   MPG 194 (H) 04/03/2013   No results found for: PROLACTIN Lab Results  Component Value Date    CHOL  05/04/2008    127        ATP III CLASSIFICATION:  <200     mg/dL   Desirable  200-239  mg/dL   Borderline High  >=240    mg/dL   High          TRIG 90 05/04/2008   HDL 27 (L) 05/04/2008   CHOLHDL 4.7 05/04/2008   VLDL 18 05/04/2008   LDLCALC  05/04/2008    82        Total Cholesterol/HDL:CHD Risk Coronary Heart Disease Risk Table                     Men   Women  1/2 Average Risk   3.4   3.3  Average Risk       5.0   4.4  2 X Average Risk   9.6   7.1  3 X Average Risk  23.4   11.0        Use the calculated Patient Ratio above and the CHD Risk Table to determine the patient's CHD Risk.        ATP III CLASSIFICATION (LDL):  <100     mg/dL   Optimal  100-129  mg/dL   Near or Above                    Optimal  130-159  mg/dL   Borderline  160-189  mg/dL   High  >190  mg/dL   Very High    Physical Findings: AIMS: Facial and Oral Movements Muscles of Facial Expression: None, normal Lips and Perioral Area: None, normal Jaw: None, normal Tongue: None, normal,Extremity Movements Upper (arms, wrists, hands, fingers): None, normal Lower (legs, knees, ankles, toes): None, normal, Trunk Movements Neck, shoulders, hips: None, normal, Overall Severity Severity of abnormal movements (highest score from questions above): None, normal Incapacitation due to abnormal movements: None, normal Patient's awareness of abnormal movements (rate only patient's report): No Awareness, Dental Status Current problems with teeth and/or dentures?: No Does patient usually wear dentures?: No  CIWA:    COWS:     Musculoskeletal: Strength & Muscle Tone: within normal limits Gait & Station: normal Patient leans: N/A  Psychiatric Specialty Exam: Physical Exam  ROS mild headache, no chest pain, no shortness of breath, no nausea, no vomiting   Blood pressure 135/87, pulse (!) 121, temperature 97.7 F (36.5 C), temperature source Oral, resp. rate 20, height 5' 7.5" (1.715 m), weight 100.7  kg (222 lb).Body mass index is 34.26 kg/m.  General Appearance: Well Groomed  Eye Contact:  Good  Speech:  Normal Rate  Volume:  Normal  Mood:  less depressed, improving   Affect:  Appropriate and becoming more reactive   Thought Process:  Linear and Descriptions of Associations: Intact  Orientation:  Other:  fully alert and attentive   Thought Content:  denies hallucinations, no delusions, not internally preoccupied   Suicidal Thoughts:  No denies any suicidal or self injurious ideations, denies homicidal or violent ideations   Homicidal Thoughts:  No  Memory:  recent and remote grossly intact   Judgement:  Other:  improving   Insight:  improving   Psychomotor Activity:  Normal  Concentration:  Concentration: Good and Attention Span: Good  Recall:  Good  Fund of Knowledge:  Good  Language:  Good  Akathisia:  Negative  Handed:  Right  AIMS (if indicated):     Assets:  Communication Skills Desire for Improvement Resilience  ADL's:  Intact  Cognition:  WNL  Sleep:  Number of Hours: 6.5   Assessment - patient is presenting with gradually improving mood , and presents with fuller range of affect. At this time denies suicidal ideations and presents future oriented . He is tolerating Lamictal titration and Remeron trial well thus far . Plans to return home after discharge.   Treatment Plan Summary: Treatment plan reviewed as below today 8/28 Daily contact with patient to assess and evaluate symptoms and progress in treatment, Medication management, Plan inpatient treatment  and medications as below Encourage group and milieu participation to work on coping skills and symptom reduction Continue  Lamictal  25 mgrs QAM and 50 mgrs QHS for mood disorder Continue Remeron 7.5 mgrs QHS for insomnia and depression  Increase Neurontin to 100 mgrs TID for anxiety, pain Treatment team working on disposition planning options   Jenne Campus, MD 10/12/2016, 9:07 AM   Patient ID: [name], male   DOB: 10/28/1955, 61 y.o.   MRN: [id number]

## 2016-10-12 NOTE — Progress Notes (Signed)
Nursing Progress Note [id number]  D) Patient presents calm, pleasant and cooperative. Patient reports, "I am supposed to discharge tomorrow, but I am feeling a little nervous. I will probably feel better tomorrow". Patient reports after discharge he plans to "follow up with the [facility], which is what I wanted to do in the first place". Patient has a bright, animated affect and engages appropriately with Probation officer. Patient CBG monitored throughout shift. Patient compliant with medications. Patient denies concerns for writer this evening. Patient denies SI/HI/AVH or pain and contracts for safety on the unit.  A) Emotional support given. 1:1 interaction and active listening provided. Patient medicated as prescribed. Medications and plan of care reviewed with patient. Patient verbalized understanding without further questions. Snacks and fluids provided. Opportunities for questions or concerns presented to patient. Patient encouraged to continue to work on treatment goals. Labs, vital signs and patient behavior monitored throughout shift. Patient safety maintained with q15 min safety checks. Moderate fall risk precautions in place and reviewed with patient; patient verbalized understanding.  R) Patient receptive to interaction with nurse. Patient remains safe on the unit at this time. Patient denies any adverse medication reactions at this time. Patient is resting in bed without complaints. Will continue to monitor.

## 2016-10-12 NOTE — BHH Group Notes (Signed)
LCSW Group Therapy Note  10/12/2016 1:15pm  Type of Therapy/Topic:  Group Therapy:  Feelings about Diagnosis  Participation Level:  Active   Description of Group:   This group will allow patients to explore their thoughts and feelings about diagnoses they have received. Patients will be guided to explore their level of understanding and acceptance of these diagnoses. Facilitator will encourage patients to process their thoughts and feelings about the reactions of others to their diagnosis and will guide patients in identifying ways to discuss their diagnosis with significant others in their lives. This group will be process-oriented, with patients participating in exploration of their own experiences, giving and receiving support, and processing challenge from other group members.   Therapeutic Goals: 1. Patient will demonstrate understanding of diagnosis as evidenced by identifying two or more symptoms of the disorder 2. Patient will be able to express two feelings regarding the diagnosis 3. Patient will demonstrate their ability to communicate their needs through discussion and/or role play  Summary of Patient Progress: Pt participated in group discussion and in the group activity. Pt identified the need for group therapy and following his treatment plan.     Therapeutic Modalities:   Cognitive Behavioral Therapy Brief Therapy Feelings Identification    Gladstone Lighter, LCSW 10/12/2016 2:45 PM

## 2016-10-12 NOTE — Plan of Care (Signed)
Problem: Education: Goal: Emotional status will improve Outcome: Progressing Patient denies SI/HI/AVH and reports his anxiety/depression has decrease since his admission.

## 2016-10-13 LAB — GLUCOSE, CAPILLARY
GLUCOSE-CAPILLARY: 162 mg/dL — AB (ref 65–99)
Glucose-Capillary: 152 mg/dL — ABNORMAL HIGH (ref 65–99)

## 2016-10-13 MED ORDER — LAMOTRIGINE 25 MG PO TABS: 50.0000 mg | ORAL_TABLET | Freq: Every day | ORAL | 0 refills | Status: DC

## 2016-10-13 MED ORDER — GABAPENTIN 100 MG PO CAPS: 100.0000 mg | ORAL_CAPSULE | Freq: Three times a day (TID) | ORAL | 0 refills | Status: DC

## 2016-10-13 MED ORDER — LAMOTRIGINE 25 MG PO TABS: 25.0000 mg | ORAL_TABLET | ORAL | 0 refills | Status: DC

## 2016-10-13 MED ORDER — MIRTAZAPINE 7.5 MG PO TABS: 7.5000 mg | ORAL_TABLET | Freq: Every day | ORAL | 0 refills | Status: DC

## 2016-10-13 MED ORDER — HYDROXYZINE HCL 25 MG PO TABS: 25.0000 mg | ORAL_TABLET | Freq: Three times a day (TID) | ORAL | 0 refills | Status: DC | PRN

## 2016-10-13 NOTE — Progress Notes (Signed)
Discharge note:  Patient discharged home per MD order.  Patient received all personal belongings from unit and locker.  Reviewed AVS/transition record with patient and he indicated understanding.  Patient denies any thoughts of self harm.  He rates his depression, anxiety and hopelessness as a 3.  He is sleeping and eating well.  Patient left ambulatory and was transported home via [facility].

## 2016-10-13 NOTE — Progress Notes (Signed)
Recreation Therapy Notes  Date:  10/13/16 Time: 0930 Location: 500 Hall Dayroom  Group Topic: Stress Management  Goal Area(s) Addresses:  Patient will verbalize importance of using healthy stress management.  Patient will identify positive emotions associated with healthy stress management.   Intervention: Stress Management  Activity :  Guided Imagery.  LRT introduced the stress management technique of guided imagery.  LRT read a script so patients could follow along and imagine being in their peaceful place.  Patients were to follow along as LRT read the script to engage in the activity.  Education: Stress Management, Discharge Planning.   Education Outcome: Acknowledges edcuation/In group clarification offered/Needs additional education  Clinical Observations/Feedback:  Pt did not attend group.    Victorino Sparrow, LRT/CTRS        Victorino Sparrow A 10/13/2016 12:25 PM

## 2016-10-13 NOTE — BHH Suicide Risk Assessment (Signed)
[facility] Discharge Suicide Risk Assessment   Principal Problem: MDD (major depressive disorder), recurrent severe, without psychosis ([place]) Discharge Diagnoses:  Patient Active Problem List   Diagnosis Date Noted  . MDD (major depressive disorder), recurrent severe, without psychosis ([place]) [F33.2] 10/09/2016  . Diabetes type 2, controlled ([place]) [E11.9] 01/01/2015  . Erectile dysfunction [N52.9] 01/01/2015  . Healthcare maintenance [Z00.00] 01/01/2015  . Bipolar 1 disorder, depressed ([place]) [F31.9] 07/23/2013  . Bipolar disorder ([place]) [F31.9] 04/03/2013  . MDD (major depressive disorder), recurrent, severe, with psychosis ([place]) [F33.3] 11/04/2012  . Suicidal ideation [R45.851] 11/04/2012  . Post traumatic stress disorder (PTSD) [F43.10] 11/04/2012  . CARCINOMA, RENAL CELL [C64.9] 06/05/2008  . HYPERLIPIDEMIA [E78.5] 06/05/2008  . HYPERTENSION [I10] 06/05/2008  . CHEST PAIN [R07.9] 06/05/2008    Total Time spent with patient: 30 minutes  Musculoskeletal: Strength & Muscle Tone: within normal limits Gait & Station: normal Patient leans: N/A  Psychiatric Specialty Exam: ROS denies headache, no chest pain, no shortness of breath, no vomiting  Blood pressure (!) 142/85, pulse (!) 107, temperature 98.8 F (37.1 C), temperature source Oral, resp. rate 18, height 5' 7.5" (1.715 m), weight 100.7 kg (222 lb).Body mass index is 34.26 kg/m.  General Appearance: improved grooming   Eye Contact::  Good  Speech:  Normal Rate409  Volume:  Normal  Mood:  improving mood and range of affect - today reports his mood as "OK"  Affect:  appropriate, reactive   Thought Process:  Linear and Descriptions of Associations: Intact  Orientation:  Full (Time, Place, and Person)  Thought Content:  denies hallucinations, no delusions, not internally preoccupied  Suicidal Thoughts:  No denies suicidal or self injurious ideations, denies homicidal or violent ideations   Homicidal Thoughts:  No  Memory:  recent and  remote grossly intact   Judgement:  Other:  improving   Insight:  improving   Psychomotor Activity:  Normal  Concentration:  Good  Recall:  Good  Fund of Knowledge:Good  Language: Good  Akathisia:  Negative  Handed:  Right  AIMS (if indicated):     Assets:  Communication Skills Desire for Improvement Resilience  Sleep:  Number of Hours: 5.75  Cognition: WNL  ADL's:  Intact   Mental Status Per Nursing Assessment::   On Admission:  Suicidal ideation indicated by patient  Demographic Factors:  61 year old male, Veteran   Loss Factors: History of mental illness, chronic medical illness, mother passed away 8 months ago   Historical Factors: Reports prior history of Bipolar Disorder and of PTSD diagnosis, history of alcohol abuse in remission  Risk Reduction Factors:   Sense of responsibility to family and Positive coping skills or problem solving skills  Continued Clinical Symptoms:  At this time patient presents alert, attentive, well related, calm, pleasant, mood improved and presents with fuller range of affect. No thought disorder, no suicidal or self injurious ideations, no hallucinations, no delusions, not internally preoccupied, future oriented . Behavior on unit in good control. Denies medication side effects at this time. Expresses feeling significantly better and feeling ready for discharge today.  Cognitive Features That Contribute To Risk:  No gross cognitive deficits noted upon discharge. Is alert , attentive, and oriented x 3   Suicide Risk:  Mild:  Suicidal ideation of limited frequency, intensity, duration, and specificity.  There are no identifiable plans, no associated intent, mild dysphoria and related symptoms, good self-control (both objective and subjective assessment), few other risk factors, and identifiable protective factors, including available and  accessible social support.  Follow-up Information    [facility] Follow up on  10/14/2016.   Why:  at 4:00pm with Dr. Grandville Silos for medication management. Please bring your hopsital discharge paperwork. Please ask Dr. Grandville Silos to write an order for a therapist.  Contact information: [address] [place], [place] [address] Phone: [phone] Fax: [phone]          Plan Of Care/Follow-up recommendations:  Activity:  as tolerated  Diet:  Regular Tests:  NA Other:  See below Patient is leaving unit in good spirits  Plans to return home Plans to follow up as above  He also has an established PCP at [place]. Elisabeth Cara for medical issues as needed  Jenne Campus, MD 10/13/2016, 9:32 AM

## 2016-10-13 NOTE — Progress Notes (Signed)
  [facility] Adult Case Management Discharge Plan :  Will you be returning to the same living situation after discharge:  Yes,  Pt returning home At discharge, do you have transportation home?: Yes,  pt provided with taxi vouchers Do you have the ability to pay for your medications: Yes,  Pt provided with prescriptions  Release of information consent forms completed and in the chart;  Patient's signature needed at discharge.  Patient to Follow up at: Follow-up Information    [facility] Follow up on 10/14/2016.   Why:  at 4:00pm with Dr. Grandville Silos for medication management. Please bring your hopsital discharge paperwork. Please ask Dr. Grandville Silos to write an order for a therapist. If you need to reschedule, please call the number below and enter extension [phone] Contact information: [address] [place], [place] [address] Phone: [phone] Fax: [phone]          Next level of care provider has access to [place] and Suicide Prevention discussed: Yes,  with Pt; declines family contact  Have you used any form of tobacco in the last 30 days? (Cigarettes, Smokeless Tobacco, Cigars, and/or Pipes): Yes  Has patient been referred to the Quitline?: Patient refused referral  Patient has been referred for addiction treatment: Yes  Gladstone Lighter, LCSW 10/13/2016, 9:52 AM

## 2016-10-13 NOTE — Discharge Summary (Signed)
Physician Discharge Summary Note  Patient:  [name] is an 61 y.o., male MRN:  [id number] DOB:  09-09-1955 Patient phone:  [phone] (home)  Patient address:   [place] [address],  Total Time spent with patient: 30 minutes  Date of Admission:  10/09/2016 Date of Discharge: 10/13/16   Reason for Admission:  Worsening depression after mother's death  Principal Problem: MDD (major depressive disorder), recurrent severe, without psychosis [facility] [place]) Discharge Diagnoses: Patient Active Problem List   Diagnosis Date Noted  . MDD (major depressive disorder), recurrent severe, without psychosis ([place]) [F33.2] 10/09/2016  . Diabetes type 2, controlled ([place]) [E11.9] 01/01/2015  . Erectile dysfunction [N52.9] 01/01/2015  . Healthcare maintenance [Z00.00] 01/01/2015  . Bipolar 1 disorder, depressed ([place]) [F31.9] 07/23/2013  . Bipolar disorder ([place]) [F31.9] 04/03/2013  . MDD (major depressive disorder), recurrent, severe, with psychosis ([place]) [F33.3] 11/04/2012  . Suicidal ideation [R45.851] 11/04/2012  . Post traumatic stress disorder (PTSD) [F43.10] 11/04/2012  . CARCINOMA, RENAL CELL [C64.9] 06/05/2008  . HYPERLIPIDEMIA [E78.5] 06/05/2008  . HYPERTENSION [I10] 06/05/2008  . CHEST PAIN [R07.9] 06/05/2008    Past Psychiatric History: PTSD, depression, Suicidal ideation/attempts  Past Medical History:  Past Medical History:  Diagnosis Date  . Arthritis   . Cancer ([place])   . Depression   . Diabetes mellitus   . GERD (gastroesophageal reflux disease)   . H/O suicide attempt 1997 and 2011   jumped out of moving vehicle and attempted to jump into moving traffic  . High cholesterol   . Hypertension   . Neuropathic pain     Past Surgical History:  Procedure Laterality Date  . NEPHRECTOMY     due to cancer   Family History:  Family History  Problem Relation Age of Onset  . Alcohol abuse Sister   . Alcohol abuse Brother    Family Psychiatric  History:  Bipolar, depression and Anxiety  Social History:  History  Alcohol Use No    Comment: Pt has been clean for 8 months     History  Drug Use No    Social History   Social History  . Marital status: Single    Spouse name: N/A  . Number of children: N/A  . Years of education: N/A   Social History Main Topics  . Smoking status: Current Every Day Smoker    Packs/day: 0.50    Years: 20.00    Types: Cigarettes  . Smokeless tobacco: Never Used  . 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 Drug use: No  . Sexual activity: Yes    Birth control/ protection: Condom   Other Topics Concern  . None   Social History Narrative  . None    Hospital Course:   Noted by ED provider- [name] is a 61 y.o. male with a PMHx of depression with prior remote suicide attempt, DM2, GERD, gastroparesis, HTN, HLD, neuropathy, bipolar 1 disorder, PTSD, and renal cell carcinoma s/p nephrectomy, who presents to the ED with complaints of gradually worsening depression after the death of his mother 8 months ago. Patient states that he has been having nightmares related to his mother's death which upsets him. He has been having thoughts of suicide and mentions having a vague plan of using chemicals or knives that he has at home in order to commit suicide. He also mentions that he occasionally sees shadows of people next to his bed, but reports this  is intermittent. He denies HI, auditory hallucinations, drug use, or alcohol use. He admits to being a cigarette smoker. He denies any other medical complaints at this time. He is compliant with all his medications, and last took them around 6 AM. He admittedly had been taking his metformin and metoprolol only once a day, instead of twice a day which is how it's listed in his chart, but the rest of them he is taking as prescribed. He has also been started on gabapentin 100 mg at bedtime for arthritis pain. The remainder of his meds are correct as  listed in the [facility] on Epic. He is here voluntarily for help with his depression.   10/10/16: [name] is awake, alert and oriented. Patient present with a pleasant but flat affect.  [name] validates the information that was provided in the notes above. Reports is medications are prescribed by the [facility] in [place]. Reports a family history of mental illness. Reports hx of depression and suicidal ideation with 1 attempt about 5 years ago. 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  Denies suicidal or homicidal ideation during this discussion. . Denies auditory or visual hallucination and does not appear to be responding to internal stimuli. Support, encouragement and reassurance was provided.  Patient was on Abilify at home and this had been stopped and patient's Lamictal was increased to 25 mg QAM and 50 mg QPM. Patient stabilized on these medications.  10/11/16: 61 year old male, lives alone, on disability. He reports feeling depressed over recent months, particularly after his mother passed away several months ago. Presented for depression, SI. Reports prior history of Bipolar Disorder and of PTSD Today reports he is feeling better, less severely depressed and denies suicidal ideations. Presents vaguely depressed, sad, but affect is reactive. Visible in  day room, going to groups. Behavior on unit in good control. Denies any medication side effects at this time.   10/12/16: Patient presents with partial improvement compared to admission- reports improving mood and affect is more reactive. No disruptive or agitated behaviors on unit, visible in day room. Going to groups. Denies medication side effects- currently on Remeron , Neurontin,and Lamictal. As he improves he is becoming more future oriented, and today we spoke about disposition planning options- he states he plans to return home, concerned about how he will get home and what transportation may be  available . Plans to follow up at [facility] in [place]  Upon discharge patient denied any SI/HI/AVH and contracted for safety. Prescriptions were provided as well as education about his medications to continue at home. He states that he will follow up at the [facility] in [place].    Physical Findings: AIMS: Facial and Oral Movements Muscles of Facial Expression: None, normal Lips and Perioral Area: None, normal Jaw: None, normal Tongue: None, normal,Extremity Movements Upper (arms, wrists, hands, fingers): None, normal Lower (legs, knees, ankles, toes): None, normal, Trunk Movements Neck, shoulders, hips: None, normal, Overall Severity Severity of abnormal movements (highest score from questions above): None, normal Incapacitation due to abnormal movements: None, normal Patient's awareness of abnormal movements (rate only patient's report): No Awareness, Dental Status Current problems with teeth and/or dentures?: No Does patient usually wear dentures?: No  CIWA:    COWS:     Musculoskeletal: Strength & Muscle Tone: within normal limits Gait & Station: normal Patient leans: N/A  Psychiatric Specialty Exam: Physical Exam  Nursing note and vitals reviewed. Constitutional: He  is oriented to person, place, and time. He appears well-developed and well-nourished.  Respiratory: Effort normal.  Musculoskeletal: Normal range of motion.  Neurological: He is alert and oriented to person, place, and time.  Skin: Skin is warm.    Review of Systems  Constitutional: Negative.   HENT: Negative.   Eyes: Negative.   Respiratory: Negative.   Cardiovascular: Negative.   Gastrointestinal: Negative.   Genitourinary: Negative.   Musculoskeletal: Negative.   Skin: Negative.   Neurological: Negative.   Endo/Heme/Allergies: Negative.     Blood pressure (!) 142/85, pulse (!) 107, temperature 98.8 F (37.1 C), temperature source Oral, resp. rate 18, height 5' 7.5" (1.715 m), weight  100.7 kg (222 lb).Body mass index is 34.26 kg/m.  General Appearance: Casual  Eye Contact:  Good  Speech:  Clear and Coherent and Normal Rate  Volume:  Normal  Mood:  Euthymic  Affect:  Appropriate  Thought Process:  Coherent and Descriptions of Associations: Intact  Orientation:  Full (Time, Place, and Person)  Thought Content:  WDL  Suicidal Thoughts:  No  Homicidal Thoughts:  No  Memory:  Immediate;   Good Recent;   Good  Judgement:  Good  Insight:  Good  Psychomotor Activity:  Normal  Concentration:  Concentration: Good  Recall:  Good  Fund of Knowledge:  Good  Language:  Good  Akathisia:  No  Handed:  Right  AIMS (if indicated):     Assets:  Desire for Improvement Financial Resources/Insurance Housing Social Support Transportation  ADL's:  Intact  Cognition:  WNL  Sleep:  Number of Hours: 5.75     Have you used any form of tobacco in the last 30 days? (Cigarettes, Smokeless Tobacco, Cigars, and/or Pipes): Yes  Has this patient used any form of tobacco in the last 30 days? (Cigarettes, Smokeless Tobacco, Cigars, and/or Pipes) Yes, No  Blood Alcohol level:  Lab Results  Component Value Date   [facility] <5 10/08/2016   ETH <11 17/51/0258    Metabolic Disorder Labs:  Lab Results  Component Value Date   HGBA1C 8.9 (H) 10/11/2016   MPG 208.73 10/11/2016   MPG 194 (H) 04/03/2013   No results found for: PROLACTIN Lab Results  Component Value Date   CHOL  05/04/2008    127        ATP III CLASSIFICATION:  <200     mg/dL   Desirable  200-239  mg/dL   Borderline High  >=240    mg/dL   High          TRIG 90 05/04/2008   HDL 27 (L) 05/04/2008   CHOLHDL 4.7 05/04/2008   VLDL 18 05/04/2008   LDLCALC  05/04/2008    82        Total Cholesterol/HDL:CHD Risk Coronary Heart Disease Risk Table                     Men   Women  1/2 Average Risk   3.4   3.3  Average Risk       5.0   4.4  2 X Average Risk   9.6   7.1  3 X Average Risk  23.4   11.0        Use the  calculated Patient Ratio above and the CHD Risk Table to determine the patient's CHD Risk.        ATP III CLASSIFICATION (LDL):  <100     mg/dL   Optimal  100-129  mg/dL  Near or Above                    Optimal  130-159  mg/dL   Borderline  160-189  mg/dL   High  >190     mg/dL   Very High    See Psychiatric Specialty Exam and Suicide Risk Assessment completed by Attending Physician prior to discharge.  Discharge destination:  Home  Is patient on multiple antipsychotic therapies at discharge:  No   Has Patient had three or more failed trials of antipsychotic monotherapy by history:  No  Recommended Plan for Multiple Antipsychotic Therapies: NA   Allergies as of 10/13/2016      Reactions   Morphine And Related Other (See Comments)   Pt unable to take narcotics.       Medication List    STOP taking these medications   ARIPiprazole 20 MG tablet Commonly known as:  ABILIFY   DULoxetine 20 MG capsule Commonly known as:  CYMBALTA   gabapentin 800 MG tablet Commonly known as:  NEURONTIN Replaced by:  gabapentin 100 MG capsule   sildenafil 20 MG tablet Commonly known as:  REVATIO   sildenafil 25 MG tablet Commonly known as:  VIAGRA   traZODone 150 MG tablet Commonly known as:  DESYREL     TAKE these medications     Indication  atorvastatin 40 MG tablet Commonly known as:  LIPITOR Take 1 tablet (40 mg total) by mouth daily. What changed:  how much to take  Indication:  Disease of the Heart and Blood Vessels   gabapentin 100 MG capsule Commonly known as:  NEURONTIN Take 1 capsule (100 mg total) by mouth 3 (three) times daily. For mood control Replaces:  gabapentin 800 MG tablet  Indication:  mood stability   glipiZIDE 10 MG tablet Commonly known as:  GLUCOTROL Take 1 tablet (10 mg total) by mouth daily before breakfast. What changed:  when to take this  Indication:  Type 2 Diabetes   hydrochlorothiazide 25 MG tablet Commonly known as:   HYDRODIURIL Take 25 mg by mouth daily.  Indication:  High Blood Pressure Disorder   hydrOXYzine 25 MG tablet Commonly known as:  ATARAX/VISTARIL Take 1 tablet (25 mg total) by mouth 3 (three) times daily as needed for anxiety.  Indication:  Feeling Anxious   insulin glargine 100 UNIT/ML injection Commonly known as:  LANTUS Inject 50 Units into the skin at bedtime.  Indication:  Type 2 Diabetes   lamoTRIgine 25 MG tablet Commonly known as:  LAMICTAL Take 2 tablets (50 mg total) by mouth at bedtime. For mood control What changed:  You were already taking a medication with the same name, and this prescription was added. Make sure you understand how and when to take each.  Indication:  mood stability   lamoTRIgine 25 MG tablet Commonly known as:  LAMICTAL Take 1 tablet (25 mg total) by mouth every morning. For mood control What changed:  when to take this  additional instructions  Indication:  Mood stabilization   lisinopril 20 MG tablet Commonly known as:  PRINIVIL,ZESTRIL Take 1 tablet (20 mg total) by mouth daily.  Indication:  High Blood Pressure Disorder   metFORMIN 1000 MG tablet Commonly known as:  GLUCOPHAGE TAKE ONE TABLET BY MOUTH TWICE DAILY WITH MEAL  Indication:  Type 2 Diabetes   metoprolol tartrate 50 MG tablet Commonly known as:  LOPRESSOR Take 0.5 tablets (25 mg total) by mouth 2 (two) times daily.  Indication:  High Blood Pressure Disorder   mirtazapine 7.5 MG tablet Commonly known as:  REMERON Take 1 tablet (7.5 mg total) by mouth at bedtime. For mood control  Indication:  mood stability   omeprazole 20 MG capsule Commonly known as:  PRILOSEC Take 20 mg by mouth daily.  Indication:  Gastroesophageal Reflux Disease   SYSTANE OP Apply 1 drop to eye 4 (four) times daily.  Indication:  Per PCP orders      Follow-up Information    [facility] Follow up on 10/14/2016.   Why:  at 4:00pm with Dr. Grandville Silos for medication  management. Please bring your hopsital discharge paperwork. Please ask Dr. Grandville Silos to write an order for a therapist. If you need to reschedule, please call the number below and enter extension [phone] Contact information: [address] [place], [place] [address] Phone: [phone] Fax: [phone]          Follow-up recommendations:  Continue activity as tolerated. Continue diet as recommended by your PCP. Ensure to keep all appointments with outpatient providers.  Comments:  Patient is instructed prior to discharge to: Take all medications as prescribed by his/her mental healthcare provider. Report any adverse effects and or reactions from the medicines to his/her outpatient provider promptly. Patient has been instructed & cautioned: To not engage in alcohol and or illegal drug use while on prescription medicines. In the event of worsening symptoms, patient is instructed to call the crisis hotline, 911 and or go to the nearest ED for appropriate evaluation and treatment of symptoms. To follow-up with his/her primary care provider for your other medical issues, concerns and or health care needs.    Signed: Lowry Ram Money, FNP 10/13/2016, 11:15 AM   Patient seen, Suicide Assessment Completed.  Disposition Plan Reviewed

## 2016-10-14 NOTE — Progress Notes (Signed)
Chaplain providing individual spiritual support around grief and loss.

## 2016-10-14 NOTE — [facility] Group Notes (Signed)
Pt attended spiritual care group on grief and loss facilitated by chaplain Alannah Averhart   Group opened with brief discussion and psycho-social ed around grief and loss in relationships and in relation to self - identifying life patterns, circumstances, changes that cause losses. Established group norm of speaking from own life experience. Group goal of establishing open and affirming space for members to share loss and experience with grief, normalize grief experience and provide psycho social education and grief support.     

## 2016-11-04 ENCOUNTER — Encounter: Payer: Self-pay | Admitting: Gastroenterology

## 2016-12-16 ENCOUNTER — Ambulatory Visit (AMBULATORY_SURGERY_CENTER): Payer: Self-pay

## 2016-12-16 VITALS — Ht 70.0 in | Wt 212.8 lb

## 2016-12-16 DIAGNOSIS — Z1211 Encounter for screening for malignant neoplasm of colon: Secondary | ICD-10-CM

## 2016-12-16 MED ORDER — NA SULFATE-K SULFATE-MG SULF 17.5-3.13-1.6 GM/177ML PO SOLN: 1.0000 | Freq: Once | ORAL | 0 refills | Status: AC

## 2016-12-16 NOTE — Progress Notes (Signed)
Denies allergies to eggs or soy products. Denies complication of anesthesia or sedation. Denies use of weight loss medication. Denies use of O2.   Emmi instructions declined.  

## 2016-12-30 ENCOUNTER — Encounter: Payer: Self-pay | Admitting: Gastroenterology

## 2016-12-30 ENCOUNTER — Ambulatory Visit (AMBULATORY_SURGERY_CENTER): Payer: Medicaid Other | Admitting: Gastroenterology

## 2016-12-30 ENCOUNTER — Other Ambulatory Visit: Payer: Self-pay

## 2016-12-30 VITALS — BP 107/69 | HR 97 | Temp 98.6°F | Resp 20 | Ht 70.0 in | Wt 212.0 lb

## 2016-12-30 DIAGNOSIS — Z1212 Encounter for screening for malignant neoplasm of rectum: Secondary | ICD-10-CM

## 2016-12-30 DIAGNOSIS — Z1211 Encounter for screening for malignant neoplasm of colon: Secondary | ICD-10-CM | POA: Diagnosis not present

## 2016-12-30 DIAGNOSIS — K648 Other hemorrhoids: Secondary | ICD-10-CM

## 2016-12-30 MED ORDER — DEXTROSE 5 % IV SOLN: INTRAVENOUS | Status: DC

## 2016-12-30 MED ORDER — SODIUM CHLORIDE 0.9 % IV SOLN: 500.0000 mL | INTRAVENOUS | Status: DC

## 2016-12-30 NOTE — Progress Notes (Signed)
Called to room to assist during endoscopic procedure.  Patient ID and intended procedure confirmed with present staff. Received instructions for my participation in the procedure from the performing physician.  

## 2016-12-30 NOTE — Op Note (Signed)
[facility] Patient Name: [name] Procedure Date: 12/30/2016 9:27 AM MRN: [id number] Endoscopist: Remo Lipps P. Armbruster MD, MD Age: 61 Referring MD:  Date of Birth: [birth year] Gender: Male Account #: [id number] Procedure:                Colonoscopy Indications:              Screening for colorectal malignant neoplasm Medicines:                Monitored Anesthesia Care Procedure:                Pre-Anesthesia Assessment:                           - Prior to the procedure, a History and Physical                            was performed, and patient medications and                            allergies were reviewed. The patient's tolerance of                            previous anesthesia was also reviewed. The risks                            and benefits of the procedure and the sedation                            options and risks were discussed with the patient.                            All questions were answered, and informed consent                            was obtained. Prior Anticoagulants: The patient has                            taken no previous anticoagulant or antiplatelet                            agents. ASA Grade Assessment: II - A patient with                            mild systemic disease. After reviewing the risks                            and benefits, the patient was deemed in                            satisfactory condition to undergo the procedure.                           After obtaining informed consent, the colonoscope  was passed under direct vision. Throughout the                            procedure, the patient's blood pressure, pulse, and                            oxygen saturations were monitored continuously. The                            Colonoscope was introduced through the anus and                            advanced to the the cecum, identified by                            appendiceal  orifice and ileocecal valve. The                            colonoscopy was performed without difficulty. The                            patient tolerated the procedure well. The quality                            of the bowel preparation was adequate. The                            ileocecal valve, appendiceal orifice, and rectum                            were photographed. Scope In: 9:32:26 AM Scope Out: 9:51:47 AM Scope Withdrawal Time: 0 hours 15 minutes 28 seconds  Total Procedure Duration: 0 hours 19 minutes 21 seconds  Findings:                 The perianal and digital rectal examinations were                            normal.                           A moderate amount of liquid stool was found in the                            entire colon, making visualization difficult. Time                            was taken to lavage the colon using copious amounts                            of sterile water, resulting in clearance with                            adequate visualization.  Internal hemorrhoids were found during retroflexion.                           The left colon was redundant with looping. The exam                            was otherwise without abnormality. No polyps. Complications:            No immediate complications. Estimated blood loss:                            None. Estimated Blood Loss:     Estimated blood loss: none. Impression:               - Internal hemorrhoids.                           - Redundant left colon with looping                           - The examination was otherwise normal.                           - No polyps. Recommendation:           - Patient has a contact number available for                            emergencies. The signs and symptoms of potential                            delayed complications were discussed with the                            patient. Return to normal activities tomorrow.                             Written discharge instructions were provided to the                            patient.                           - Resume previous diet.                           - Continue present medications.                           - Repeat colonoscopy in 10 years for screening                            purposes. Remo Lipps P. Armbruster MD, MD 12/30/2016 9:55:46 AM This report has been signed electronically.

## 2016-12-30 NOTE — Patient Instructions (Signed)
YOU HAD AN ENDOSCOPIC PROCEDURE TODAY AT [facility]:   Refer to the procedure report that was given to you for any specific questions about what was found during the examination.  If the procedure report does not answer your questions, please call your gastroenterologist to clarify.  If you requested that your care partner not be given the details of your procedure findings, then the procedure report has been included in a sealed envelope for you to review at your convenience later.  YOU SHOULD EXPECT: Some feelings of bloating in the abdomen. Passage of more gas than usual.  Walking can help get rid of the air that was put into your GI tract during the procedure and reduce the bloating. If you had a lower endoscopy (such as a colonoscopy or flexible sigmoidoscopy) you may notice spotting of blood in your stool or on the toilet paper. If you underwent a bowel prep for your procedure, you may not have a normal bowel movement for a few days.  Please Note:  You might notice some irritation and congestion in your nose or some drainage.  This is from the oxygen used during your procedure.  There is no need for concern and it should clear up in a day or so.  SYMPTOMS TO REPORT IMMEDIATELY:   Following lower endoscopy (colonoscopy or flexible sigmoidoscopy):  Excessive amounts of blood in the stool  Significant tenderness or worsening of abdominal pains  Swelling of the abdomen that is new, acute  Fever of 100F or higher  For urgent or emergent issues, a gastroenterologist can be reached at any hour by calling [phone].   DIET:  We do recommend a small meal at first, but then you may proceed to your regular diet.  Drink plenty of fluids but you should avoid alcoholic beverages for 24 hours.  ACTIVITY:  You should plan to take it easy for the rest of today and you should NOT DRIVE or use heavy machinery until tomorrow (because of the sedation medicines used during the test).     FOLLOW UP: Our staff will call the number listed on your records the next business day following your procedure to check on you and address any questions or concerns that you may have regarding the information given to you following your procedure. If we do not reach you, we will leave a message.  However, if you are feeling well and you are not experiencing any problems, there is no need to return our call.  We will assume that you have returned to your regular daily activities without incident.  If any biopsies were taken you will be contacted by phone or by letter within the next 1-3 weeks.  Please call us at [phone] if you have not heard about the biopsies in 3 weeks.   Repeat Colonoscopy screening in 10 years Hemorrhoids (handout given)  SIGNATURES/CONFIDENTIALITY: You and/or your care partner have signed paperwork which will be entered into your electronic medical record.  These signatures attest to the fact that that the information above on your After Visit Summary has been reviewed and is understood.  Full responsibility of the confidentiality of this discharge information lies with you and/or your care-partner.

## 2016-12-30 NOTE — Progress Notes (Signed)
Report given to PACU, vss 

## 2016-12-31 ENCOUNTER — Telehealth: Payer: Self-pay | Admitting: *Deleted

## 2016-12-31 ENCOUNTER — Telehealth: Payer: Self-pay

## 2016-12-31 NOTE — Telephone Encounter (Signed)
Attempted to reach pt. With follow up call following endoscopic procedure 12/30/2016.  LM on pt. Ans. Machine to call if he has any questions or concerns.

## 2016-12-31 NOTE — Telephone Encounter (Signed)
  Follow up Call-  Call back number 12/30/2016  Post procedure Call Back phone  # [phone]  Permission to leave phone message Yes  Some recent data might be hidden     Left message on f/u call

## 2020-01-04 ENCOUNTER — Emergency Department (HOSPITAL_COMMUNITY): Payer: No Typology Code available for payment source

## 2020-01-04 ENCOUNTER — Inpatient Hospital Stay (HOSPITAL_COMMUNITY)
Admission: EM | Admit: 2020-01-04 | Discharge: 2020-01-25 | DRG: 291 | Disposition: A | Discharge status: Skilled Nursing Facility | Payer: No Typology Code available for payment source | Attending: Internal Medicine | Admitting: Internal Medicine

## 2020-01-04 ENCOUNTER — Other Ambulatory Visit: Payer: Self-pay

## 2020-01-04 ENCOUNTER — Encounter (HOSPITAL_COMMUNITY): Payer: Self-pay | Admitting: Internal Medicine

## 2020-01-04 DIAGNOSIS — F0390 Unspecified dementia without behavioral disturbance: Secondary | ICD-10-CM | POA: Diagnosis not present

## 2020-01-04 DIAGNOSIS — E7800 Pure hypercholesterolemia, unspecified: Secondary | ICD-10-CM | POA: Diagnosis present

## 2020-01-04 DIAGNOSIS — Z85528 Personal history of other malignant neoplasm of kidney: Secondary | ICD-10-CM

## 2020-01-04 DIAGNOSIS — J90 Pleural effusion, not elsewhere classified: Secondary | ICD-10-CM

## 2020-01-04 DIAGNOSIS — K219 Gastro-esophageal reflux disease without esophagitis: Secondary | ICD-10-CM | POA: Diagnosis present

## 2020-01-04 DIAGNOSIS — I700 Atherosclerosis of aorta: Secondary | ICD-10-CM | POA: Diagnosis not present

## 2020-01-04 DIAGNOSIS — Z794 Long term (current) use of insulin: Secondary | ICD-10-CM | POA: Diagnosis not present

## 2020-01-04 DIAGNOSIS — I509 Heart failure, unspecified: Secondary | ICD-10-CM | POA: Diagnosis present

## 2020-01-04 DIAGNOSIS — K5900 Constipation, unspecified: Secondary | ICD-10-CM | POA: Diagnosis present

## 2020-01-04 DIAGNOSIS — Z6830 Body mass index (BMI) 30.0-30.9, adult: Secondary | ICD-10-CM | POA: Diagnosis not present

## 2020-01-04 DIAGNOSIS — Z720 Tobacco use: Secondary | ICD-10-CM | POA: Diagnosis not present

## 2020-01-04 DIAGNOSIS — I110 Hypertensive heart disease with heart failure: Principal | ICD-10-CM | POA: Diagnosis present

## 2020-01-04 DIAGNOSIS — Z8249 Family history of ischemic heart disease and other diseases of the circulatory system: Secondary | ICD-10-CM | POA: Diagnosis not present

## 2020-01-04 DIAGNOSIS — Q446 Cystic disease of liver: Secondary | ICD-10-CM | POA: Diagnosis not present

## 2020-01-04 DIAGNOSIS — F17210 Nicotine dependence, cigarettes, uncomplicated: Secondary | ICD-10-CM | POA: Diagnosis present

## 2020-01-04 DIAGNOSIS — E11649 Type 2 diabetes mellitus with hypoglycemia without coma: Secondary | ICD-10-CM | POA: Diagnosis not present

## 2020-01-04 DIAGNOSIS — I10 Essential (primary) hypertension: Secondary | ICD-10-CM | POA: Diagnosis present

## 2020-01-04 DIAGNOSIS — I5020 Unspecified systolic (congestive) heart failure: Secondary | ICD-10-CM | POA: Diagnosis present

## 2020-01-04 DIAGNOSIS — F4312 Post-traumatic stress disorder, chronic: Secondary | ICD-10-CM | POA: Diagnosis not present

## 2020-01-04 DIAGNOSIS — N179 Acute kidney failure, unspecified: Secondary | ICD-10-CM | POA: Diagnosis present

## 2020-01-04 DIAGNOSIS — Z751 Person awaiting admission to adequate facility elsewhere: Secondary | ICD-10-CM

## 2020-01-04 DIAGNOSIS — G8929 Other chronic pain: Secondary | ICD-10-CM | POA: Diagnosis present

## 2020-01-04 DIAGNOSIS — E119 Type 2 diabetes mellitus without complications: Secondary | ICD-10-CM

## 2020-01-04 DIAGNOSIS — M810 Age-related osteoporosis without current pathological fracture: Secondary | ICD-10-CM | POA: Diagnosis present

## 2020-01-04 DIAGNOSIS — Z79899 Other long term (current) drug therapy: Secondary | ICD-10-CM | POA: Diagnosis not present

## 2020-01-04 DIAGNOSIS — F329 Major depressive disorder, single episode, unspecified: Secondary | ICD-10-CM | POA: Diagnosis not present

## 2020-01-04 DIAGNOSIS — F4310 Post-traumatic stress disorder, unspecified: Secondary | ICD-10-CM | POA: Diagnosis present

## 2020-01-04 DIAGNOSIS — R2243 Localized swelling, mass and lump, lower limb, bilateral: Secondary | ICD-10-CM | POA: Diagnosis not present

## 2020-01-04 DIAGNOSIS — S72009A Fracture of unspecified part of neck of unspecified femur, initial encounter for closed fracture: Secondary | ICD-10-CM

## 2020-01-04 DIAGNOSIS — F339 Major depressive disorder, recurrent, unspecified: Secondary | ICD-10-CM | POA: Diagnosis not present

## 2020-01-04 DIAGNOSIS — R030 Elevated blood-pressure reading, without diagnosis of hypertension: Secondary | ICD-10-CM | POA: Diagnosis not present

## 2020-01-04 DIAGNOSIS — I517 Cardiomegaly: Secondary | ICD-10-CM | POA: Diagnosis not present

## 2020-01-04 DIAGNOSIS — I5021 Acute systolic (congestive) heart failure: Secondary | ICD-10-CM | POA: Diagnosis not present

## 2020-01-04 DIAGNOSIS — E1165 Type 2 diabetes mellitus with hyperglycemia: Secondary | ICD-10-CM | POA: Diagnosis not present

## 2020-01-04 DIAGNOSIS — R0602 Shortness of breath: Secondary | ICD-10-CM | POA: Diagnosis present

## 2020-01-04 DIAGNOSIS — E876 Hypokalemia: Secondary | ICD-10-CM | POA: Diagnosis present

## 2020-01-04 DIAGNOSIS — M25562 Pain in left knee: Secondary | ICD-10-CM | POA: Diagnosis not present

## 2020-01-04 DIAGNOSIS — E669 Obesity, unspecified: Secondary | ICD-10-CM | POA: Diagnosis present

## 2020-01-04 DIAGNOSIS — Z20822 Contact with and (suspected) exposure to covid-19: Secondary | ICD-10-CM | POA: Diagnosis present

## 2020-01-04 DIAGNOSIS — I340 Nonrheumatic mitral (valve) insufficiency: Secondary | ICD-10-CM | POA: Diagnosis not present

## 2020-01-04 DIAGNOSIS — R918 Other nonspecific abnormal finding of lung field: Secondary | ICD-10-CM | POA: Diagnosis not present

## 2020-01-04 DIAGNOSIS — I951 Orthostatic hypotension: Secondary | ICD-10-CM

## 2020-01-04 DIAGNOSIS — J918 Pleural effusion in other conditions classified elsewhere: Secondary | ICD-10-CM | POA: Diagnosis present

## 2020-01-04 DIAGNOSIS — I493 Ventricular premature depolarization: Secondary | ICD-10-CM | POA: Diagnosis present

## 2020-01-04 DIAGNOSIS — W1811XA Fall from or off toilet without subsequent striking against object, initial encounter: Secondary | ICD-10-CM | POA: Diagnosis not present

## 2020-01-04 DIAGNOSIS — K7689 Other specified diseases of liver: Secondary | ICD-10-CM | POA: Diagnosis not present

## 2020-01-04 DIAGNOSIS — Z811 Family history of alcohol abuse and dependence: Secondary | ICD-10-CM

## 2020-01-04 DIAGNOSIS — R600 Localized edema: Secondary | ICD-10-CM | POA: Diagnosis not present

## 2020-01-04 DIAGNOSIS — Z905 Acquired absence of kidney: Secondary | ICD-10-CM

## 2020-01-04 DIAGNOSIS — D1800 Hemangioma unspecified site: Secondary | ICD-10-CM | POA: Diagnosis present

## 2020-01-04 DIAGNOSIS — Z7984 Long term (current) use of oral hypoglycemic drugs: Secondary | ICD-10-CM

## 2020-01-04 DIAGNOSIS — F319 Bipolar disorder, unspecified: Secondary | ICD-10-CM | POA: Diagnosis present

## 2020-01-04 DIAGNOSIS — E785 Hyperlipidemia, unspecified: Secondary | ICD-10-CM | POA: Diagnosis not present

## 2020-01-04 DIAGNOSIS — I083 Combined rheumatic disorders of mitral, aortic and tricuspid valves: Secondary | ICD-10-CM | POA: Diagnosis present

## 2020-01-04 DIAGNOSIS — F3289 Other specified depressive episodes: Secondary | ICD-10-CM | POA: Diagnosis not present

## 2020-01-04 DIAGNOSIS — R079 Chest pain, unspecified: Secondary | ICD-10-CM | POA: Diagnosis not present

## 2020-01-04 DIAGNOSIS — I5023 Acute on chronic systolic (congestive) heart failure: Secondary | ICD-10-CM | POA: Diagnosis not present

## 2020-01-04 DIAGNOSIS — I2510 Atherosclerotic heart disease of native coronary artery without angina pectoris: Secondary | ICD-10-CM | POA: Diagnosis not present

## 2020-01-04 DIAGNOSIS — J9811 Atelectasis: Secondary | ICD-10-CM | POA: Diagnosis not present

## 2020-01-04 DIAGNOSIS — Z9889 Other specified postprocedural states: Secondary | ICD-10-CM | POA: Diagnosis not present

## 2020-01-04 DIAGNOSIS — E1169 Type 2 diabetes mellitus with other specified complication: Secondary | ICD-10-CM | POA: Diagnosis not present

## 2020-01-04 DIAGNOSIS — I361 Nonrheumatic tricuspid (valve) insufficiency: Secondary | ICD-10-CM | POA: Diagnosis not present

## 2020-01-04 DIAGNOSIS — R42 Dizziness and giddiness: Secondary | ICD-10-CM | POA: Diagnosis not present

## 2020-01-04 DIAGNOSIS — I252 Old myocardial infarction: Secondary | ICD-10-CM

## 2020-01-04 DIAGNOSIS — M7989 Other specified soft tissue disorders: Secondary | ICD-10-CM

## 2020-01-04 DIAGNOSIS — R0600 Dyspnea, unspecified: Secondary | ICD-10-CM

## 2020-01-04 DIAGNOSIS — F17200 Nicotine dependence, unspecified, uncomplicated: Secondary | ICD-10-CM | POA: Diagnosis present

## 2020-01-04 DIAGNOSIS — R000 Tachycardia, unspecified: Secondary | ICD-10-CM | POA: Diagnosis not present

## 2020-01-04 LAB — BASIC METABOLIC PANEL
Anion gap: 12 (ref 5–15)
BUN: 16 mg/dL (ref 8–23)
CO2: 20 mmol/L — ABNORMAL LOW (ref 22–32)
Calcium: 9.1 mg/dL (ref 8.9–10.3)
Chloride: 107 mmol/L (ref 98–111)
Creatinine, Ser: 1.27 mg/dL — ABNORMAL HIGH (ref 0.61–1.24)
GFR, Estimated: >60 mL/min (ref >60)
Glucose, Bld: 265 mg/dL — ABNORMAL HIGH (ref 70–99)
Potassium: 3.7 mmol/L (ref 3.5–5.1)
Sodium: 139 mmol/L (ref 135–145)

## 2020-01-04 LAB — RESPIRATORY PANEL BY RT PCR (FLU A&B, COVID)
Influenza A by PCR: NEGATIVE
Influenza B by PCR: NEGATIVE
SARS Coronavirus 2 by RT PCR: NEGATIVE

## 2020-01-04 LAB — CBC WITH DIFFERENTIAL/PLATELET
Abs Immature Granulocytes: 0.01 10*3/uL (ref 0.00–0.07)
Basophils Absolute: 0 10*3/uL (ref 0.0–0.1)
Basophils Relative: 1 %
Eosinophils Absolute: 0.1 10*3/uL (ref 0.0–0.5)
Eosinophils Relative: 1 %
HCT: 41.7 % (ref 39.0–52.0)
Hemoglobin: 12.6 g/dL — ABNORMAL LOW (ref 13.0–17.0)
Immature Granulocytes: 0 %
Lymphocytes Relative: 24 %
Lymphs Abs: 1.1 10*3/uL (ref 0.7–4.0)
MCH: 25.4 pg — ABNORMAL LOW (ref 26.0–34.0)
MCHC: 30.2 g/dL (ref 30.0–36.0)
MCV: 83.9 fL (ref 80.0–100.0)
Monocytes Absolute: 0.4 10*3/uL (ref 0.1–1.0)
Monocytes Relative: 9 %
Neutro Abs: 2.9 10*3/uL (ref 1.7–7.7)
Neutrophils Relative %: 65 %
Platelets: 198 10*3/uL (ref 150–400)
RBC: 4.97 MIL/uL (ref 4.22–5.81)
RDW: 15 % (ref 11.5–15.5)
WBC: 4.4 10*3/uL (ref 4.0–10.5)
nRBC: 0 % (ref 0.0–0.2)

## 2020-01-04 LAB — TROPONIN I (HIGH SENSITIVITY): Troponin I (High Sensitivity): 35 ng/L — ABNORMAL HIGH (ref <18)

## 2020-01-04 LAB — D-DIMER, QUANTITATIVE: D-Dimer, Quant: 0.87 ug/mL-FEU — ABNORMAL HIGH (ref 0.00–0.50)

## 2020-01-04 LAB — GLUCOSE, CAPILLARY: Glucose-Capillary: 244 mg/dL — ABNORMAL HIGH (ref 70–99)

## 2020-01-04 LAB — BRAIN NATRIURETIC PEPTIDE: B Natriuretic Peptide: 560 pg/mL — ABNORMAL HIGH (ref 0.0–100.0)

## 2020-01-04 MED ORDER — LAMOTRIGINE 100 MG PO TABS
200.0000 mg | ORAL_TABLET | Freq: Two times a day (BID) | ORAL | Status: DC
  Administered 2020-01-04 – 2020-01-25 (×43): 200 mg via ORAL
  Filled 2020-01-04 (×45): qty 2

## 2020-01-04 MED ORDER — INSULIN ASPART 100 UNIT/ML ~~LOC~~ SOLN
0.0000 [IU] | Freq: Three times a day (TID) | SUBCUTANEOUS | Status: DC
  Administered 2020-01-05: 5 [IU] via SUBCUTANEOUS
  Administered 2020-01-06 – 2020-01-07 (×3): 3 [IU] via SUBCUTANEOUS
  Administered 2020-01-07: 2 [IU] via SUBCUTANEOUS
  Administered 2020-01-08 – 2020-01-09 (×3): 3 [IU] via SUBCUTANEOUS
  Administered 2020-01-09: 5 [IU] via SUBCUTANEOUS
  Administered 2020-01-10: 3 [IU] via SUBCUTANEOUS
  Administered 2020-01-10 (×2): 2 [IU] via SUBCUTANEOUS
  Administered 2020-01-11: 3 [IU] via SUBCUTANEOUS
  Administered 2020-01-11: 8 [IU] via SUBCUTANEOUS
  Administered 2020-01-12: 2 [IU] via SUBCUTANEOUS
  Administered 2020-01-12: 8 [IU] via SUBCUTANEOUS
  Administered 2020-01-12: 3 [IU] via SUBCUTANEOUS
  Administered 2020-01-13: 11 [IU] via SUBCUTANEOUS
  Administered 2020-01-14: 3 [IU] via SUBCUTANEOUS
  Administered 2020-01-14: 5 [IU] via SUBCUTANEOUS
  Administered 2020-01-15: 2 [IU] via SUBCUTANEOUS
  Administered 2020-01-15: 5 [IU] via SUBCUTANEOUS
  Administered 2020-01-16: 2 [IU] via SUBCUTANEOUS
  Administered 2020-01-16: 3 [IU] via SUBCUTANEOUS
  Administered 2020-01-16: 5 [IU] via SUBCUTANEOUS
  Administered 2020-01-17 (×2): 2 [IU] via SUBCUTANEOUS
  Administered 2020-01-18: 5 [IU] via SUBCUTANEOUS
  Administered 2020-01-18: 3 [IU] via SUBCUTANEOUS
  Administered 2020-01-19: 5 [IU] via SUBCUTANEOUS
  Administered 2020-01-19 – 2020-01-20 (×3): 3 [IU] via SUBCUTANEOUS
  Administered 2020-01-20: 5 [IU] via SUBCUTANEOUS
  Administered 2020-01-21: 8 [IU] via SUBCUTANEOUS
  Administered 2020-01-21: 2 [IU] via SUBCUTANEOUS
  Administered 2020-01-21 – 2020-01-22 (×3): 5 [IU] via SUBCUTANEOUS
  Administered 2020-01-22: 8 [IU] via SUBCUTANEOUS
  Administered 2020-01-23 (×3): 3 [IU] via SUBCUTANEOUS
  Administered 2020-01-24: 11 [IU] via SUBCUTANEOUS
  Administered 2020-01-24: 3 [IU] via SUBCUTANEOUS
  Administered 2020-01-24: 2 [IU] via SUBCUTANEOUS
  Administered 2020-01-25: 5 [IU] via SUBCUTANEOUS
  Administered 2020-01-25 (×2): 2 [IU] via SUBCUTANEOUS

## 2020-01-04 MED ORDER — ALBUTEROL SULFATE (2.5 MG/3ML) 0.083% IN NEBU: 2.5000 mg | INHALATION_SOLUTION | RESPIRATORY_TRACT | Status: DC | PRN

## 2020-01-04 MED ORDER — ENOXAPARIN SODIUM 40 MG/0.4ML ~~LOC~~ SOLN
40.0000 mg | SUBCUTANEOUS | Status: DC
  Administered 2020-01-04 – 2020-01-25 (×23): 40 mg via SUBCUTANEOUS
  Filled 2020-01-04 (×22): qty 0.4

## 2020-01-04 MED ORDER — DOCUSATE SODIUM 100 MG PO CAPS
100.0000 mg | ORAL_CAPSULE | Freq: Two times a day (BID) | ORAL | Status: DC
  Administered 2020-01-04 – 2020-01-24 (×28): 100 mg via ORAL
  Filled 2020-01-04 (×40): qty 1

## 2020-01-04 MED ORDER — LISINOPRIL 20 MG PO TABS
20.0000 mg | ORAL_TABLET | Freq: Every day | ORAL | Status: DC
  Administered 2020-01-04 – 2020-01-05 (×2): 20 mg via ORAL
  Filled 2020-01-04 (×2): qty 1

## 2020-01-04 MED ORDER — DULOXETINE HCL 20 MG PO CPEP
20.0000 mg | ORAL_CAPSULE | Freq: Every day | ORAL | Status: DC
  Administered 2020-01-04 – 2020-01-25 (×22): 20 mg via ORAL
  Filled 2020-01-04 (×22): qty 1

## 2020-01-04 MED ORDER — HYDROCHLOROTHIAZIDE 25 MG PO TABS
25.0000 mg | ORAL_TABLET | Freq: Every day | ORAL | Status: DC
  Administered 2020-01-04 – 2020-01-05 (×2): 25 mg via ORAL
  Filled 2020-01-04 (×2): qty 1

## 2020-01-04 MED ORDER — ACETAMINOPHEN 325 MG PO TABS
650.0000 mg | ORAL_TABLET | Freq: Four times a day (QID) | ORAL | Status: DC | PRN
  Administered 2020-01-11 – 2020-01-25 (×4): 650 mg via ORAL
  Filled 2020-01-04 (×4): qty 2

## 2020-01-04 MED ORDER — FUROSEMIDE 10 MG/ML IJ SOLN
40.0000 mg | Freq: Once | INTRAMUSCULAR | Status: AC
  Administered 2020-01-04: 40 mg via INTRAVENOUS
  Filled 2020-01-04: qty 4

## 2020-01-04 MED ORDER — INSULIN GLARGINE 100 UNIT/ML ~~LOC~~ SOLN: 50.0000 [IU] | Freq: Every day | SUBCUTANEOUS | Status: DC

## 2020-01-04 MED ORDER — NICOTINE POLACRILEX 2 MG MT GUM
2.0000 mg | CHEWING_GUM | OROMUCOSAL | Status: DC | PRN
  Administered 2020-01-04 – 2020-01-09 (×2): 2 mg via ORAL
  Filled 2020-01-04 (×3): qty 1

## 2020-01-04 MED ORDER — IOHEXOL 350 MG/ML SOLN
62.0000 mL | Freq: Once | INTRAVENOUS | Status: AC | PRN
  Administered 2020-01-04: 62 mL via INTRAVENOUS

## 2020-01-04 MED ORDER — ACETAMINOPHEN 650 MG RE SUPP: 650.0000 mg | Freq: Four times a day (QID) | RECTAL | Status: DC | PRN

## 2020-01-04 MED ORDER — FUROSEMIDE 10 MG/ML IJ SOLN
20.0000 mg | Freq: Once | INTRAMUSCULAR | Status: AC
  Administered 2020-01-04: 20 mg via INTRAVENOUS
  Filled 2020-01-04: qty 2

## 2020-01-04 MED ORDER — ATORVASTATIN CALCIUM 40 MG PO TABS
40.0000 mg | ORAL_TABLET | Freq: Every day | ORAL | Status: DC
  Administered 2020-01-04 – 2020-01-25 (×22): 40 mg via ORAL
  Filled 2020-01-04 (×22): qty 1

## 2020-01-04 MED ORDER — TRAZODONE HCL 50 MG PO TABS: 50.0000 mg | ORAL_TABLET | Freq: Every evening | ORAL | Status: DC | PRN

## 2020-01-04 MED ORDER — GABAPENTIN 300 MG PO CAPS
600.0000 mg | ORAL_CAPSULE | Freq: Every day | ORAL | Status: DC | PRN
  Administered 2020-01-18: 600 mg via ORAL
  Filled 2020-01-04: qty 2

## 2020-01-04 MED ORDER — INSULIN GLARGINE 100 UNIT/ML ~~LOC~~ SOLN
40.0000 [IU] | Freq: Every day | SUBCUTANEOUS | Status: DC
  Administered 2020-01-04 – 2020-01-06 (×3): 40 [IU] via SUBCUTANEOUS
  Filled 2020-01-04 (×4): qty 0.4

## 2020-01-04 MED ORDER — SENNA 8.6 MG PO TABS
1.0000 | ORAL_TABLET | Freq: Two times a day (BID) | ORAL | Status: DC
  Administered 2020-01-04 – 2020-01-24 (×30): 8.6 mg via ORAL
  Filled 2020-01-04 (×41): qty 1

## 2020-01-04 MED ORDER — ARIPIPRAZOLE 10 MG PO TABS
10.0000 mg | ORAL_TABLET | Freq: Every day | ORAL | Status: DC
  Administered 2020-01-04 – 2020-01-25 (×22): 10 mg via ORAL
  Filled 2020-01-04 (×24): qty 1

## 2020-01-04 NOTE — ED Notes (Signed)
Pt keeps asking different staff for food. We explained to the pt that we can not give him food till Doctor clears him. Pt is becoming aggitated
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
## 2020-01-04 NOTE — ED Notes (Signed)
Pt provided with sandwich and water. ER MD at bedside updating patient on plan of care. Call bell within reach.

## 2020-01-04 NOTE — Plan of Care (Signed)

## 2020-01-04 NOTE — Hospital Course (Addendum)
[name] is a 64 year old male with past medical history of hypertension, type 2 diabetes on insulin, major depressive disorder, bipolar disorder, PTSD, renal cell carcinoma s/p nephrectomy, chronic knee pain who admitted hospital for bilateral lower extremity swelling and shortness of breath, found to have new onset HFrEF complicated by transudate of effusion.  HFrEF complicated by transudative effusion requiring thoracentesis Patient presented with lower extremity edema and shortness of breath and found to have a new diagnosis of heart failure with reduced ejection fraction complicated by right pleural effusion. Patient had a thoracentesis with removal of 700 cc.  Laboratory analysis showed transudate of effusion.  Heart failure was consulted and started guideline directed therapy which included Entresto and Coreg.  Patient ultimately had a complication resulting in orthostatic hypotension.  Neuro evaluation was negative for stroke.  Dizziness thought to be secondary to orthostatic hypotension in the setting of new medications.  [name] was held and patient was switched to metoprolol succinate with improvement of his blood pressure and gradual improvement of his dizziness.  Ultimately patient was diuresed almost 8 L with a new dry weight of around 183 pounds.  Cardiology recommended outpatient ischemic evaluation to further evaluate possible CAD  2.  CAD/hyperlipidemia Patient was continued on statin medication and counseled on the importance of smoking cessation.  4.  Diabetes Patient takes Lantus 50 units daily and glipizide for home regimen for diabetes.  Patient has experienced some episodes of hypoglycemia during the hospital requiring reduced dosages of insulin.  We will discharge him on Lantus 40 units nightly.  We will not restart glipizide at discharge due to these hypoglycemic spells.  He will need reevaluation of his diabetic medication regimen at follow-up.  Consider starting SGLT2  in the outpatient setting..  5. Major depressive disorder/bipolar disorder/posttraumatic stress disorder: Patient was continued on home Abilify, duloxetine, Lamictal.  6.  Cognitive impairment: Concern for cognitive impairment during this hospitalization with a Moca of 14. He will need his Moca repeated in the outpatient setting once his medical issues have been stabilized

## 2020-01-04 NOTE — ED Provider Notes (Signed)
[facility] EMERGENCY DEPARTMENT Provider Note   CSN: [id number] Arrival date & time: 01/04/20  0815     History Chief Complaint  Patient presents with  . Leg Swelling    [name] is a 64 y.o. male with a history of hypertension, type 2 diabetes on insulin, anxiety, arthritis, high cholesterol, major depressive disorder, presented to emergency department with concern for bilateral leg swelling shortness of breath.  The patient reports that he has had some worsening dyspnea on exertion and orthopnea for the past month.  He received a booster Pfizer vaccine for [employer] a week and a half ago, and states that in the days following that, he noted his bilateral legs have also been swelling.  This is symmetrical swelling of his bilateral legs.  He describes pain in his bilateral knees and also lower extremities.  He says he has had some swelling issues in the past, but never both legs at the same time, and never this significant.  He denies any history of congestive heart failure.  He is not on any diuretics at home.    Denies any history of blood clots in his legs or his lungs.  He is not on any blood thinners.  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  He denies to me that he has any suicidal or homicidal ideations.    He smokes 3-4 cigarettes per day.  He lives by himself in [place].  He typically follows with the [facility] hospital, but was told by his doctor to come to the ER today.   HPI     Past Medical History:  Diagnosis Date  . Anxiety   . Arthritis   . Cancer ([place])   . Cataract   . Depression   . Diabetes mellitus   . GERD (gastroesophageal reflux disease)   . H/O suicide attempt 1997 and 2011   jumped out of moving vehicle and attempted to jump into moving traffic  . High cholesterol    . Hypertension   . Neuropathic pain   . Osteoporosis     Patient Active Problem List   Diagnosis Date Noted  . Bilateral lower extremity edema 01/04/2020  . Shortness of breath 01/04/2020  . MDD (major depressive disorder), recurrent severe, without psychosis ([place]) 10/09/2016  . Diabetes type 2, controlled ([place]) 01/01/2015  . Erectile dysfunction 01/01/2015  . Healthcare maintenance 01/01/2015  . Bipolar 1 disorder, depressed ([place]) 07/23/2013  . Bipolar disorder ([place]) 04/03/2013  . MDD (major depressive disorder), recurrent, severe, with psychosis ([place]) 11/04/2012  . Suicidal ideation 11/04/2012  . Post traumatic stress disorder (PTSD) 11/04/2012  . CARCINOMA, RENAL CELL 06/05/2008  . HYPERLIPIDEMIA 06/05/2008  . HYPERTENSION 06/05/2008  . CHEST PAIN 06/05/2008    Past Surgical History:  Procedure Laterality Date  . COLONOSCOPY    . NEPHRECTOMY     due to cancer       Family History  Problem Relation Age of Onset  . Alcohol abuse Sister   . Alcohol abuse Brother   . Colon cancer Neg Hx   . Esophageal cancer Neg Hx   . Pancreatic cancer Neg Hx   . Rectal cancer Neg Hx   . Stomach cancer Neg Hx     Social History   Tobacco Use  .  Smoking status: Current Every Day Smoker    Packs/day: 0.50    Years: 20.00    Pack years: 10.00    Types: Cigarettes  . Smokeless tobacco: Never Used  Vaping Use  . Vaping Use: Never used  Substance Use Topics  . Alcohol use: No    Alcohol/week: 3.0 standard drinks    Types: 3 Cans of beer per week    Comment: Pt has been clean for 8 months  . Drug use: No    Home Medications Prior to Admission medications   Medication Sig Start Date End Date Taking? Authorizing Provider  ARIPiprazole (ABILIFY) 10 MG tablet Take 10 mg by mouth daily. Takes one-half tablet in the morning   Yes [provider]  atorvastatin (LIPITOR) 40 MG tablet Take 1 tablet (40 mg total) by mouth daily. Patient taking differently: Take 80 mg by  mouth daily.  07/27/13  Yes Niel Hummer, NP  clotrimazole (LOTRIMIN) 1 % external solution Apply 1 application topically 2 (two) times daily.   Yes [provider]  DULoxetine (CYMBALTA) 20 MG capsule Take 20 mg by mouth daily.   Yes [provider]  gabapentin (NEURONTIN) 600 MG tablet Take 600 mg by mouth daily as needed (right knee pain).   Yes [provider]  glipiZIDE (GLUCOTROL) 10 MG tablet Take 1 tablet (10 mg total) by mouth daily before breakfast. Patient taking differently: Take 10 mg by mouth 2 (two) times daily before a meal.  07/27/13  Yes Niel Hummer, NP  hydrochlorothiazide (HYDRODIURIL) 25 MG tablet Take 25 mg by mouth daily.   Yes [provider]  insulin glargine (LANTUS) 100 UNIT/ML injection Inject 50 Units into the skin at bedtime.   Yes [provider]  lamoTRIgine (LAMICTAL) 200 MG tablet Take 200 mg by mouth 2 (two) times daily. One-half tablet twice daily   Yes [provider]  lisinopril (PRINIVIL,ZESTRIL) 20 MG tablet Take 1 tablet (20 mg total) by mouth daily. 07/27/13  Yes Niel Hummer, NP  Multiple Vitamin (MULTIVITAMIN WITH MINERALS) TABS tablet Take 1 tablet by mouth daily.   Yes [provider]  nicotine polacrilex (COMMIT) 2 MG lozenge Take 2 mg by mouth every 2 (two) hours as needed for smoking cessation.   Yes [provider]  traZODone (DESYREL) 50 MG tablet Take 50 mg by mouth at bedtime as needed for sleep.   Yes [provider]  gabapentin (NEURONTIN) 100 MG capsule Take 1 capsule (100 mg total) by mouth 3 (three) times daily. For mood control Patient not taking: Reported on 01/04/2020 10/13/16   Money, Lowry Ram, FNP  lamoTRIgine (LAMICTAL) 25 MG tablet Take 1 tablet (25 mg total) by mouth every morning. For mood control Patient not taking: Reported on 01/04/2020 10/14/16   Money, Lowry Ram, FNP  lamoTRIgine (LAMICTAL) 25 MG tablet Take 2 tablets (50 mg total) by mouth at  bedtime. For mood control Patient not taking: Reported on 01/04/2020 10/13/16   Money, Lowry Ram, FNP  metFORMIN (GLUCOPHAGE) 1000 MG tablet TAKE ONE TABLET BY MOUTH TWICE DAILY WITH MEAL Patient not taking: Reported on 01/04/2020 03/21/15   Veatrice Bourbon, MD  Polyethyl Glycol-Propyl Glycol (SYSTANE OP) Apply 1 drop to eye 4 (four) times daily. Patient not taking: Reported on 01/04/2020    [provider]    Allergies    Morphine and related  Review of Systems   Review of Systems  Constitutional: Negative for chills and fever.  Eyes: Negative for pain and visual disturbance.  Respiratory: Positive for shortness of breath. Negative for cough.   Cardiovascular: Positive for leg swelling. Negative for chest pain and palpitations.  Gastrointestinal: Negative for abdominal pain, nausea and vomiting.  Genitourinary: Negative for dysuria and hematuria.  Musculoskeletal: Negative for arthralgias and myalgias.  Skin: Negative for color change and rash.  Neurological: Negative for syncope and headaches.  All other systems reviewed and are negative.   Physical Exam Updated Vital Signs BP (!) 144/94 (BP Location: Right Arm)   Pulse (!) 110   Temp 97.6 F (36.4 C) (Oral)   Resp (!) 28   Ht 5\' 10"  (1.778 m)   Wt 96.2 kg   SpO2 96%   BMI 30.43 kg/m   Physical Exam Vitals and nursing note reviewed.  Constitutional:      Appearance: He is well-developed.  HENT:     Head: Normocephalic and atraumatic.  Eyes:     Conjunctiva/sclera: Conjunctivae normal.  Cardiovascular:     Rate and Rhythm: Regular rhythm. Tachycardia present.     Pulses: Normal pulses.     Comments: HR 110 bpm and regular Pulmonary:     Effort: Pulmonary effort is normal. No respiratory distress.     Breath sounds: Normal breath sounds.  Abdominal:     General: There is no distension.     Palpations: Abdomen is soft.     Tenderness: There is no abdominal tenderness.  Musculoskeletal:     Cervical back:  Neck supple.     Comments: Symmetrical pitting edema of the bilateral lower extremities from the thigh to ankle  Skin:    General: Skin is warm and dry.  Neurological:     General: No focal deficit present.     Mental Status: He is alert and oriented to person, place, and time.  Psychiatric:        Mood and Affect: Mood normal.        Behavior: Behavior normal.     ED Results / Procedures / Treatments   Labs (all labs ordered are listed, but only abnormal results are displayed) Labs Reviewed  BRAIN NATRIURETIC PEPTIDE - Abnormal; Notable for the following components:      Result Value   B Natriuretic Peptide 560.0 (*)    All other components within normal limits  BASIC METABOLIC PANEL - Abnormal; Notable for the following components:   CO2 20 (*)    Glucose, Bld 265 (*)    Creatinine, Ser 1.27 (*)    All other components within normal limits  CBC WITH DIFFERENTIAL/PLATELET - Abnormal; Notable for the following components:   Hemoglobin 12.6 (*)    MCH 25.4 (*)    All other components within normal limits  D-DIMER, QUANTITATIVE (NOT AT [facility]) - Abnormal; Notable for the following components:   D-Dimer, Quant 0.87 (*)    All other components within normal limits  TROPONIN I (HIGH SENSITIVITY) - Abnormal; Notable for the following components:   Troponin I (High Sensitivity) 35 (*)    All other components within normal limits  RESPIRATORY PANEL BY RT PCR (FLU A&B, COVID)    EKG EKG Interpretation  Date/Time:  Friday January 04 2020 09:05:43 EST Ventricular Rate:  111 PR Interval:    QRS Duration: 96 QT Interval:  341 QTC Calculation: 464 R Axis:   108 Text Interpretation: Sinus tachycardia Left atrial enlargement Right axis deviation Borderline T abnormalities, inferior leads No sig change from July 2013 ecg No STEMI Confirmed  by Octaviano Glow [phone]) on 01/04/2020 9:21:10 AM   Radiology CT Angio Chest PE W and/or Wo Contrast  Result Date: 01/04/2020 CLINICAL  DATA:  Shortness of breath.  Chest pain. EXAM: CT ANGIOGRAPHY CHEST WITH CONTRAST TECHNIQUE: Multidetector CT imaging of the chest was performed using the standard protocol during bolus administration of intravenous contrast. Multiplanar CT image reconstructions and MIPs were obtained to evaluate the vascular anatomy. CONTRAST:  45mL OMNIPAQUE IOHEXOL 350 MG/ML SOLN COMPARISON:  Radiography same day FINDINGS: Cardiovascular: Heart size is normal. Mild aortic atherosclerotic calcification is noted. Some coronary artery calcification, particularly in the left system. Pulmonary arterial opacification is good. There are no pulmonary emboli. Mediastinum/Nodes: No mediastinal or hilar mass or lymphadenopathy. Lungs/Pleura: Moderate to large effusion on the right, layering dependently. Dependent pulmonary atelectasis. Subtotal right lower lobe collapse. Non dependent lung is clear. No abnormality on the left. Upper Abdomen: Nonspecific 1 cm low-density in the lateral aspect of the right lobe of liver. As an isolated finding, this is most consistent with a cyst or hemangioma. This is not completely evaluated. Musculoskeletal: Normal Review of the MIP images confirms the above findings. IMPRESSION: 1. No pulmonary emboli. 2. Moderate to large right pleural effusion layering dependently. Dependent pulmonary atelectasis. Subtotal right lower lobe collapse. 3. Aortic atherosclerosis. Coronary artery calcification, particularly in the left system. 4. 1 cm low-density in the lateral aspect of the right lobe of the liver, not completely evaluated. As an isolated finding, this is most consistent with a cyst or hemangioma. Aortic Atherosclerosis (ICD10-I70.0). Electronically Signed   By: Nelson Chimes M.D.   On: 01/04/2020 12:39   DG Chest Portable 1 View  Result Date: 01/04/2020 CLINICAL DATA:  Shortness of breath EXAM: PORTABLE CHEST 1 VIEW COMPARISON:  06/17/2011 FINDINGS: Cardiomegaly and vascular pedicle widening. Hazy  bilateral pulmonary opacity at the bases. There is elevated right diaphragm. No visible effusion or pneumothorax. IMPRESSION: Hazy opacity at the bases from infiltrates or edema (the opacity is symmetric and there is cardiomegaly). Electronically Signed   By: Monte Fantasia M.D.   On: 01/04/2020 09:39    Procedures Procedures (including critical care time)  Medications Ordered in ED Medications  atorvastatin (LIPITOR) tablet 40 mg (has no administration in time range)  lisinopril (ZESTRIL) tablet 20 mg (has no administration in time range)  ARIPiprazole (ABILIFY) tablet 10 mg (has no administration in time range)  DULoxetine (CYMBALTA) DR capsule 20 mg (has no administration in time range)  nicotine polacrilex (COMMIT) lozenge 2 mg (has no administration in time range)  traZODone (DESYREL) tablet 50 mg (has no administration in time range)  insulin glargine (LANTUS) injection 50 Units (has no administration in time range)  gabapentin (NEURONTIN) capsule 600 mg (has no administration in time range)  lamoTRIgine (LAMICTAL) tablet 200 mg (has no administration in time range)  enoxaparin (LOVENOX) injection 40 mg (has no administration in time range)  acetaminophen (TYLENOL) tablet 650 mg (has no administration in time range)    Or  acetaminophen (TYLENOL) suppository 650 mg (has no administration in time range)  docusate sodium (COLACE) capsule 100 mg (has no administration in time range)  senna (SENOKOT) tablet 8.6 mg (has no administration in time range)  albuterol (PROVENTIL) (2.5 MG/3ML) 0.083% nebulizer solution 2.5 mg (has no administration in time range)  furosemide (LASIX) injection 20 mg (20 mg Intravenous Given 01/04/20 0915)  iohexol (OMNIPAQUE) 350 MG/ML injection 62 mL (62 mLs Intravenous Contrast Given 01/04/20 1214)    ED Course  I have  reviewed the triage vital signs and the nursing notes.  Pertinent labs & imaging results that were available during my care of the patient  were reviewed by me and considered in my medical decision making (see chart for details).  This patient complains of leg swelling and SOB.  This involves an extensive number of treatment options, and is a complaint that carries with it a high risk of complications and morbidity.  The differential diagnosis includes new onset CHF vs reaction to booster vaccine vs ACS vs PE vs DVT vs other  He is breathing comfortably and is not hypoxic on exam.  He is somewhat tachycardic to HR 110.  BP is stable.  I ordered, reviewed, and interpreted labs, which included trop, BNP, BMP, CBC, Ddimer.  BNp 560, Trop 35, Cr 1.27, Glucose 265, Hgb 12.6, WBC 4.4, Flu/COvid negative. I ordered medication IV lasix for peripheral edema I ordered imaging studies which included dg chest and CTPE I independently visualized and interpreted imaging which showed right pleural effusion & atelectasis, no PE and the monitor tracing which showed sinus rhythm  ECG per my interpretation shows a sinus rhythm without acute ischemic changes, poss rightward axis    Clinical Course as of Jan 03 1653  Fri Jan 04, 2020  1020 B Natriuretic Peptide(!): 560.0 [MT]  1020 Troponin I (High Sensitivity)(!): 35 [MT]  1020 CT PE ordered   [MT]  1340 Patient reassessed.  He remained stable on room air.  We discussed the CT findings.  I will admit him at this point for echocardiogram for suspected new onset CHF and further evaluation of his right pleural effusion, which may require draining.   [MT]    Clinical Course User Index [MT] Cornellius Kropp, Carola Rhine, MD    Final Clinical Impression(s) / ED Diagnoses Final diagnoses:  Shortness of breath  Leg swelling  Pleural effusion    Rx / DC Orders ED Discharge Orders    None       Wyvonnia Dusky, MD 01/04/20 1654

## 2020-01-04 NOTE — ED Notes (Signed)
Pt requesting something to eat. Educated patient on NPO status. Pt stated understanding.

## 2020-01-04 NOTE — Plan of Care (Signed)

## 2020-01-04 NOTE — ED Triage Notes (Signed)
Pt arrives from home with multiple complaints, Pt reports bilateral knee swelling, sob, states "every since I got that last covid shot I feel like everything is falling apart." Reports he called PCP who instructed him to come to ED, pt also reports seasonal depression withut SI or HI. Pt states "this time of year is hard for me because I lost my mom this time of year and I have some PTSD going on."

## 2020-01-04 NOTE — Procedures (Signed)
Heart rate is too high for accurate echo at this time. Please contact echo lab if echo is to be done regardless of high HR.

## 2020-01-04 NOTE — Plan of Care (Signed)

## 2020-01-05 ENCOUNTER — Inpatient Hospital Stay (HOSPITAL_COMMUNITY): Payer: No Typology Code available for payment source

## 2020-01-05 DIAGNOSIS — F339 Major depressive disorder, recurrent, unspecified: Secondary | ICD-10-CM

## 2020-01-05 DIAGNOSIS — I5021 Acute systolic (congestive) heart failure: Secondary | ICD-10-CM

## 2020-01-05 DIAGNOSIS — I361 Nonrheumatic tricuspid (valve) insufficiency: Secondary | ICD-10-CM

## 2020-01-05 DIAGNOSIS — I10 Essential (primary) hypertension: Secondary | ICD-10-CM

## 2020-01-05 DIAGNOSIS — E119 Type 2 diabetes mellitus without complications: Secondary | ICD-10-CM

## 2020-01-05 DIAGNOSIS — E785 Hyperlipidemia, unspecified: Secondary | ICD-10-CM

## 2020-01-05 DIAGNOSIS — R2243 Localized swelling, mass and lump, lower limb, bilateral: Secondary | ICD-10-CM

## 2020-01-05 DIAGNOSIS — I340 Nonrheumatic mitral (valve) insufficiency: Secondary | ICD-10-CM

## 2020-01-05 DIAGNOSIS — R0602 Shortness of breath: Secondary | ICD-10-CM

## 2020-01-05 LAB — GLUCOSE, CAPILLARY
Glucose-Capillary: 82 mg/dL (ref 70–99)
Glucose-Capillary: 107 mg/dL — ABNORMAL HIGH (ref 70–99)
Glucose-Capillary: 246 mg/dL — ABNORMAL HIGH (ref 70–99)
Glucose-Capillary: 87 mg/dL (ref 70–99)

## 2020-01-05 LAB — PROTEIN, PLEURAL OR PERITONEAL FLUID: Total protein, fluid: <3 g/dL

## 2020-01-05 LAB — ECHOCARDIOGRAM COMPLETE
Area-P 1/2: 4.8 cm2
Calc EF: 30.8 %
Height: 70 in
MV M vel: 4.74 m/s
MV Peak grad: 89.9 mmHg
Radius: 0.4 cm
S' Lateral: 4.4 cm
Single Plane A2C EF: 28.1 %
Single Plane A4C EF: 29 %
Weight: 3393.32 oz

## 2020-01-05 LAB — CBC
HCT: 43.9 % (ref 39.0–52.0)
Hemoglobin: 13.8 g/dL (ref 13.0–17.0)
MCH: 25.6 pg — ABNORMAL LOW (ref 26.0–34.0)
MCHC: 31.4 g/dL (ref 30.0–36.0)
MCV: 81.3 fL (ref 80.0–100.0)
Platelets: 214 10*3/uL (ref 150–400)
RBC: 5.4 MIL/uL (ref 4.22–5.81)
RDW: 14.9 % (ref 11.5–15.5)
WBC: 5.6 10*3/uL (ref 4.0–10.5)
nRBC: 0 % (ref 0.0–0.2)

## 2020-01-05 LAB — HIV ANTIBODY (ROUTINE TESTING W REFLEX): HIV Screen 4th Generation wRfx: NONREACTIVE

## 2020-01-05 LAB — BASIC METABOLIC PANEL
Anion gap: 11 (ref 5–15)
BUN: 13 mg/dL (ref 8–23)
CO2: 27 mmol/L (ref 22–32)
Calcium: 10.1 mg/dL (ref 8.9–10.3)
Chloride: 104 mmol/L (ref 98–111)
Creatinine, Ser: 1.32 mg/dL — ABNORMAL HIGH (ref 0.61–1.24)
GFR, Estimated: >60 mL/min (ref >60)
Glucose, Bld: 129 mg/dL — ABNORMAL HIGH (ref 70–99)
Potassium: 3.3 mmol/L — ABNORMAL LOW (ref 3.5–5.1)
Sodium: 142 mmol/L (ref 135–145)

## 2020-01-05 LAB — GLUCOSE, PLEURAL OR PERITONEAL FLUID: Glucose, Fluid: 120 mg/dL

## 2020-01-05 LAB — HEMOGLOBIN A1C
Hgb A1c MFr Bld: 9.9 % — ABNORMAL HIGH (ref 4.8–5.6)
Mean Plasma Glucose: 237.43 mg/dL

## 2020-01-05 LAB — LACTATE DEHYDROGENASE, PLEURAL OR PERITONEAL FLUID: LD, Fluid: 77 U/L — ABNORMAL HIGH (ref 3–23)

## 2020-01-05 MED ORDER — CARVEDILOL 3.125 MG PO TABS
3.1250 mg | ORAL_TABLET | Freq: Two times a day (BID) | ORAL | Status: DC
  Administered 2020-01-05 – 2020-01-08 (×5): 3.125 mg via ORAL
  Filled 2020-01-05 (×6): qty 1

## 2020-01-05 MED ORDER — FUROSEMIDE 10 MG/ML IJ SOLN
40.0000 mg | Freq: Two times a day (BID) | INTRAMUSCULAR | Status: DC
  Administered 2020-01-05 – 2020-01-06 (×2): 40 mg via INTRAVENOUS
  Filled 2020-01-05 (×2): qty 4

## 2020-01-05 MED ORDER — SACUBITRIL-VALSARTAN 24-26 MG PO TABS
1.0000 | ORAL_TABLET | Freq: Two times a day (BID) | ORAL | Status: DC
  Administered 2020-01-07 – 2020-01-09 (×6): 1 via ORAL
  Filled 2020-01-05 (×7): qty 1

## 2020-01-05 MED ORDER — FUROSEMIDE 10 MG/ML IJ SOLN: 40.0000 mg | Freq: Every day | INTRAMUSCULAR | Status: DC

## 2020-01-05 MED ORDER — LOSARTAN POTASSIUM 50 MG PO TABS
50.0000 mg | ORAL_TABLET | Freq: Every day | ORAL | Status: AC
  Administered 2020-01-06: 50 mg via ORAL
  Filled 2020-01-05: qty 1

## 2020-01-05 MED ORDER — LIDOCAINE HCL (PF) 1 % IJ SOLN
INTRAMUSCULAR | Status: AC
  Filled 2020-01-05: qty 30

## 2020-01-05 MED ORDER — POTASSIUM CHLORIDE 10 MEQ/100ML IV SOLN
10.0000 meq | INTRAVENOUS | Status: AC
  Administered 2020-01-05 (×2): 10 meq via INTRAVENOUS
  Filled 2020-01-05 (×2): qty 100

## 2020-01-05 NOTE — Progress Notes (Signed)
  Echocardiogram 2D Echocardiogram has been performed.  Johny Chess 01/05/2020, 9:57 AM

## 2020-01-05 NOTE — Procedures (Signed)
PROCEDURE SUMMARY:  Successful image-guided right thoracentesis. Yielded 750 milliliters of hazy gold fluid. Patient tolerated procedure well. No immediate complications. EBL < 1 mL.  Specimen was sent for labs. CXR ordered.  Please see imaging section of Epic for full dictation.   Claris Pong Braydon Kullman PA-C 01/05/2020 1:33 PM

## 2020-01-05 NOTE — Consult Note (Signed)
CARDIOLOGY CONSULT NOTE       Patient ID: [name] MRN: [id number] DOB/AGE: 23-Jan-1956 64 y.o.  Admit date: 01/04/2020 Referring Physician: Alfonse Spruce Primary Physician: Patient, No Pcp Per Primary Cardiologist: New Reason for Consultation: CHF  Active Problems:   Diabetes type 2, controlled ([place])   Bilateral lower extremity edema   Shortness of breath   HPI:  64 y.o. followed for DM, HTN, HLD PTSD/Bipolar renal cell post nephrectomy Sees [place] mostly Admitted with 2 weeks increasing dyspnea and LE edema. He is on SSD. Does not work and has no family in town. Activity limited to ADL's.  CTA with no PE. Large right effusion. Echo read by myself today showed diffuse hypokinesis with EF 25-30% moderate MR/TR. His DM is poorly controlled with A1c 9.9 and he only sees his primary at [facility] twice/year. Baseline Cr 1.3 post nephrectomy. He has no history of CAD, vascular disease MI, or syncope. He has 3 cans of beer / week. On admission BNP only 560 with negative troponin and non specific ST changes on ECG  ROS All other systems reviewed and negative except as noted above  Past Medical History:  Diagnosis Date  . Anxiety   . Arthritis   . Cancer ([place])   . Cataract   . Depression   . Diabetes mellitus   . GERD (gastroesophageal reflux disease)   . H/O suicide attempt 1997 and 2011   jumped out of moving vehicle and attempted to jump into moving traffic  . High cholesterol   . Hypertension   . Neuropathic pain   . Osteoporosis     Family History  Problem Relation Age of Onset  . Alcohol abuse Sister   . Alcohol abuse Brother   . Colon cancer Neg Hx   . Esophageal cancer Neg Hx   . Pancreatic cancer Neg Hx   . Rectal cancer Neg Hx   . Stomach cancer Neg Hx     Social History   Socioeconomic History  . Marital status: Single    Spouse name: Not on file  . Number of children: Not on file  . Years of education: Not on file  . Highest education level: Not on  file  Occupational History  . Not on file  Tobacco Use  . Smoking status: Current Every Day Smoker    Packs/day: 0.50    Years: 20.00    Pack years: 10.00    Types: Cigarettes  . Smokeless tobacco: Never Used  Vaping Use  . Vaping Use: Never used  Substance and Sexual Activity  . Alcohol use: No    Alcohol/week: 3.0 standard drinks    Types: 3 Cans of beer per week    Comment: Pt has been clean for 8 months  . Drug use: No  . Sexual activity: Yes    Birth control/protection: Condom  Other Topics Concern  . Not on file  Social History Narrative  . Not on file   Social Determinants of Health   Financial Resource Strain:   . Difficulty of Paying Living Expenses: Not on file  Food Insecurity:   . Worried About Charity fundraiser in the Last Year: Not on file  . Ran Out of Food in the Last Year: Not on file  Transportation Needs:   . Lack of Transportation (Medical): Not on file  . Lack of Transportation (Non-Medical): Not on file  Physical Activity:   . Days of Exercise per Week: Not on file  . Minutes  of Exercise per Session: Not on file  Stress:   . Feeling of Stress : Not on file  Social Connections:   . Frequency of Communication with Friends and Family: Not on file  . Frequency of Social Gatherings with Friends and Family: Not on file  . Attends Religious Services: Not on file  . Active Member of Clubs or Organizations: Not on file  . Attends Archivist Meetings: Not on file  . Marital Status: Not on file  Intimate Partner Violence:   . Fear of Current or Ex-Partner: Not on file  . Emotionally Abused: Not on file  . Physically Abused: Not on file  . Sexually Abused: Not on file    Past Surgical History:  Procedure Laterality Date  . COLONOSCOPY    . NEPHRECTOMY     due to cancer      Current Facility-Administered Medications:  .  acetaminophen (TYLENOL) tablet 650 mg, 650 mg, Oral, Q6H PRN **OR** acetaminophen (TYLENOL) suppository 650 mg, 650  mg, Rectal, Q6H PRN, [name], DO .  albuterol (PROVENTIL) (2.5 MG/3ML) 0.083% nebulizer solution 2.5 mg, 2.5 mg, Nebulization, Q2H PRN, [name], DO .  ARIPiprazole (ABILIFY) tablet 10 mg, 10 mg, Oral, Daily, [name], DO, 10 mg at 01/05/20 1143 .  atorvastatin (LIPITOR) tablet 40 mg, 40 mg, Oral, Daily, [name], DO, 40 mg at 01/05/20 1143 .  docusate sodium (COLACE) capsule 100 mg, 100 mg, Oral, BID, [name], DO, 100 mg at 01/05/20 1143 .  DULoxetine (CYMBALTA) DR capsule 20 mg, 20 mg, Oral, Daily, [name], DO, 20 mg at 01/05/20 1143 .  enoxaparin (LOVENOX) injection 40 mg, 40 mg, Subcutaneous, Q24H, [name], DO, 40 mg at 01/04/20 1800 .  furosemide (LASIX) injection 40 mg, 40 mg, Intravenous, Daily, [name], DO .  gabapentin (NEURONTIN) capsule 600 mg, 600 mg, Oral, Daily PRN, [name], DO .  hydrochlorothiazide (HYDRODIURIL) tablet 25 mg, 25 mg, Oral, Daily, [name], DO, 25 mg at 01/05/20 1143 .  insulin aspart (novoLOG) injection 0-15 Units, 0-15 Units, Subcutaneous, TID WC, [name], DO .  insulin glargine (LANTUS) injection 40 Units, 40 Units, Subcutaneous, QHS, [name], DO, 40 Units at 01/04/20 2149 .  lamoTRIgine (LAMICTAL) tablet 200 mg, 200 mg, Oral, BID, [name], DO, 200 mg at 01/05/20 1143 .  lisinopril (ZESTRIL) tablet 20 mg, 20 mg, Oral, Daily, [name], DO, 20 mg at 01/05/20 1143 .  nicotine polacrilex (NICORETTE) gum 2 mg, 2 mg, Oral, Q2H PRN, [name], DO, 2 mg at 01/04/20 1802 .  senna (SENOKOT) tablet 8.6 mg, 1 tablet, Oral, BID, [name], DO, 8.6 mg at 01/05/20 1143 .  traZODone (DESYREL) tablet 50 mg, 50 mg, Oral, QHS PRN, [name], DO . ARIPiprazole  10 mg Oral Daily  . atorvastatin  40 mg Oral Daily  . docusate sodium  100 mg Oral BID  . DULoxetine  20 mg Oral Daily  . enoxaparin (LOVENOX) injection  40 mg Subcutaneous Q24H  . furosemide  40 mg Intravenous Daily  . hydrochlorothiazide  25 mg  Oral Daily  . insulin aspart  0-15 Units Subcutaneous TID WC  . insulin glargine  40 Units Subcutaneous QHS  . lamoTRIgine  200 mg Oral BID  . lisinopril  20 mg Oral Daily  . senna  1 tablet Oral BID     Physical Exam: Blood pressure (!) 142/79, pulse (!) 102, temperature 98.4 F (36.9 C), temperature source Oral, resp. rate 18,  height 5\' 10"  (1.778 m), weight 96.2 kg, SpO2 100 %.    Excitable black male  Poor dentition  HEENT: normal Neck supple with no adenopathy JVP normal no bruits no thyromegaly Lungs decreased BS right mid/base  Heart:  S1/S2 MR  murmur, no rub, gallop or click PMI Increased  Abdomen: benighn, BS positve, no tenderness, no AAA no bruit.  No HSM or HJR Distal pulses intact with no bruits Plus one bilateral  edema Neuro non-focal Skin warm and dry No muscular weakness   Labs:   Lab Results  Component Value Date   WBC 5.6 01/05/2020   HGB 13.8 01/05/2020   HCT 43.9 01/05/2020   MCV 81.3 01/05/2020   PLT 214 01/05/2020    Recent Labs  Lab 01/05/20 0428  NA 142  K 3.3*  CL 104  CO2 27  BUN 13  CREATININE 1.32*  CALCIUM 10.1  GLUCOSE 129*   Lab Results  Component Value Date   CKTOTAL 238 (H) 05/04/2008   CKMB 1.8 05/04/2008   TROPONINI <0.30 06/16/2011    Lab Results  Component Value Date   CHOL  05/04/2008    127        ATP III CLASSIFICATION:  <200     mg/dL   Desirable  200-239  mg/dL   Borderline High  >=240    mg/dL   High          Lab Results  Component Value Date   HDL 27 (L) 05/04/2008   Lab Results  Component Value Date   LDLCALC  05/04/2008    82        Total Cholesterol/HDL:CHD Risk Coronary Heart Disease Risk Table                     Men   Women  1/2 Average Risk   3.4   3.3  Average Risk       5.0   4.4  2 X Average Risk   9.6   7.1  3 X Average Risk  23.4   11.0        Use the calculated Patient Ratio above and the CHD Risk Table to determine the patient's CHD Risk.        ATP III CLASSIFICATION  (LDL):  <100     mg/dL   Optimal  100-129  mg/dL   Near or Above                    Optimal  130-159  mg/dL   Borderline  160-189  mg/dL   High  >190     mg/dL   Very High   Lab Results  Component Value Date   TRIG 90 05/04/2008   Lab Results  Component Value Date   CHOLHDL 4.7 05/04/2008   No results found for: LDLDIRECT    Radiology: CT Angio Chest PE W and/or Wo Contrast  Result Date: 01/04/2020 CLINICAL DATA:  Shortness of breath.  Chest pain. EXAM: CT ANGIOGRAPHY CHEST WITH CONTRAST TECHNIQUE: Multidetector CT imaging of the chest was performed using the standard protocol during bolus administration of intravenous contrast. Multiplanar CT image reconstructions and MIPs were obtained to evaluate the vascular anatomy. CONTRAST:  27mL OMNIPAQUE IOHEXOL 350 MG/ML SOLN COMPARISON:  Radiography same day FINDINGS: Cardiovascular: Heart size is normal. Mild aortic atherosclerotic calcification is noted. Some coronary artery calcification, particularly in the left system. Pulmonary arterial opacification is good. There are no pulmonary emboli. Mediastinum/Nodes: No  mediastinal or hilar mass or lymphadenopathy. Lungs/Pleura: Moderate to large effusion on the right, layering dependently. Dependent pulmonary atelectasis. Subtotal right lower lobe collapse. Non dependent lung is clear. No abnormality on the left. Upper Abdomen: Nonspecific 1 cm low-density in the lateral aspect of the right lobe of liver. As an isolated finding, this is most consistent with a cyst or hemangioma. This is not completely evaluated. Musculoskeletal: Normal Review of the MIP images confirms the above findings. IMPRESSION: 1. No pulmonary emboli. 2. Moderate to large right pleural effusion layering dependently. Dependent pulmonary atelectasis. Subtotal right lower lobe collapse. 3. Aortic atherosclerosis. Coronary artery calcification, particularly in the left system. 4. 1 cm low-density in the lateral aspect of the right  lobe of the liver, not completely evaluated. As an isolated finding, this is most consistent with a cyst or hemangioma. Aortic Atherosclerosis (ICD10-I70.0). Electronically Signed   By: Nelson Chimes M.D.   On: 01/04/2020 12:39   DG Chest Portable 1 View  Result Date: 01/04/2020 CLINICAL DATA:  Shortness of breath EXAM: PORTABLE CHEST 1 VIEW COMPARISON:  06/17/2011 FINDINGS: Cardiomegaly and vascular pedicle widening. Hazy bilateral pulmonary opacity at the bases. There is elevated right diaphragm. No visible effusion or pneumothorax. IMPRESSION: Hazy opacity at the bases from infiltrates or edema (the opacity is symmetric and there is cardiomegaly). Electronically Signed   By: Monte Fantasia M.D.   On: 01/04/2020 09:39   ECHOCARDIOGRAM COMPLETE  Result Date: 01/05/2020    ECHOCARDIOGRAM REPORT   Patient Name:   [name] Date of Exam: 01/05/2020 Medical Rec #:  [id number]        Height:       70.0 in Accession #:    [id number]       Weight:       212.1 lb Date of Birth:  Jun 01, 1955        BSA:          2.140 m Patient Age:    56 years         BP:           142/79 mmHg Patient Gender: M                HR:           105 bpm. Exam Location:  Inpatient Procedure: 2D Echo Indications:    elevated troponin  History:        Patient has no prior history of Echocardiogram examinations.                 Abnormal ECG, Signs/Symptoms:Shortness of Breath and lower                 extremity edema; Risk Factors:Diabetes.  Sonographer:    Johny Chess Referring Phys: [id number] [place]  1. Diffuse hypokinesis worse in the inferior base. Left ventricular ejection fraction, by estimation, is 25 to 30%. The left ventricle has severely decreased function. The left ventricle demonstrates global hypokinesis. The left ventricular internal cavity size was mildly dilated. Left ventricular diastolic parameters were normal.  2. Right ventricular systolic function is normal. The right ventricular size is  normal. There is mildly elevated pulmonary artery systolic pressure.  3. Left atrial size was moderately dilated.  4. Right atrial size was mildly dilated.  5. The mitral valve is abnormal. Moderate mitral valve regurgitation. No evidence of mitral stenosis.  6. Tricuspid valve regurgitation is moderate.  7. The aortic valve is tricuspid. Aortic valve regurgitation is not visualized.  Mild aortic valve sclerosis is present, with no evidence of aortic valve stenosis.  8. The inferior vena cava is dilated in size with <50% respiratory variability, suggesting right atrial pressure of 15 mmHg. FINDINGS  Left Ventricle: Diffuse hypokinesis worse in the inferior base. Left ventricular ejection fraction, by estimation, is 25 to 30%. The left ventricle has severely decreased function. The left ventricle demonstrates global hypokinesis. The left ventricular  internal cavity size was mildly dilated. There is no left ventricular hypertrophy. Left ventricular diastolic parameters were normal. Right Ventricle: The right ventricular size is normal. No increase in right ventricular wall thickness. Right ventricular systolic function is normal. There is mildly elevated pulmonary artery systolic pressure. The tricuspid regurgitant velocity is 2.65  m/s, and with an assumed right atrial pressure of 8 mmHg, the estimated right ventricular systolic pressure is 76.1 mmHg. Left Atrium: Left atrial size was moderately dilated. Right Atrium: Right atrial size was mildly dilated. Pericardium: There is no evidence of pericardial effusion. Mitral Valve: The mitral valve is abnormal. Moderate mitral valve regurgitation. No evidence of mitral valve stenosis. Tricuspid Valve: The tricuspid valve is normal in structure. Tricuspid valve regurgitation is moderate . No evidence of tricuspid stenosis. Aortic Valve: The aortic valve is tricuspid. Aortic valve regurgitation is not visualized. Mild aortic valve sclerosis is present, with no evidence of  aortic valve stenosis. Pulmonic Valve: The pulmonic valve was normal in structure. Pulmonic valve regurgitation is not visualized. No evidence of pulmonic stenosis. Aorta: The aortic root is normal in size and structure. Venous: The inferior vena cava is dilated in size with less than 50% respiratory variability, suggesting right atrial pressure of 15 mmHg. IAS/Shunts: No atrial level shunt detected by color flow Doppler.  LEFT VENTRICLE PLAX 2D LVIDd:         5.30 cm LVIDs:         4.40 cm LV PW:         1.10 cm LV IVS:        0.70 cm LVOT diam:     2.10 cm LV SV:         36 LV SV Index:   17 LVOT Area:     3.46 cm  LV Volumes (MOD) LV vol d, MOD A2C: 116.0 ml LV vol d, MOD A4C: 162.0 ml LV vol s, MOD A2C: 83.4 ml LV vol s, MOD A4C: 115.0 ml LV SV MOD A2C:     32.6 ml LV SV MOD A4C:     162.0 ml LV SV MOD BP:      44.2 ml RIGHT VENTRICLE             IVC RV S prime:     12.40 cm/s  IVC diam: 2.30 cm TAPSE (M-mode): 1.5 cm LEFT ATRIUM             Index       RIGHT ATRIUM           Index LA diam:        4.80 cm 2.24 cm/m  RA Area:     20.40 cm LA Vol (A2C):   93.4 ml 43.64 ml/m RA Volume:   59.20 ml  27.66 ml/m LA Vol (A4C):   67.7 ml 31.64 ml/m LA Biplane Vol: 79.2 ml 37.01 ml/m  AORTIC VALVE LVOT Vmax:   72.50 cm/s LVOT Vmean:  46.600 cm/s LVOT VTI:    0.104 m  AORTA Ao Root diam: 2.80 cm Ao Asc diam:  3.50 cm MITRAL  VALVE                 TRICUSPID VALVE MV Area (PHT): 4.80 cm      TR Peak grad:   28.1 mmHg MV Decel Time: 158 msec      TR Vmax:        265.00 cm/s MR Peak grad:    89.9 mmHg MR Mean grad:    55.0 mmHg   SHUNTS MR Vmax:         474.00 cm/s Systemic VTI:  0.10 m MR Vmean:        343.0 cm/s  Systemic Diam: 2.10 cm MR PISA:         1.01 cm MR PISA Eff ROA: 8 mm MR PISA Radius:  0.40 cm MV E velocity: 97.40 cm/s MV A velocity: 76.60 cm/s MV E/A ratio:  1.27 Jenkins Rouge MD Electronically signed by Jenkins Rouge MD Signature Date/Time: 01/05/2020/11:24:14 AM    Final     EKG: ST non specific ST  changes    ASSESSMENT AND PLAN:   1. CHF:  New onset systolic CHF in poorly controlled diabetic with HTN, HLD post nephrectomy. Needs right IR thoracentesis. D/c HCTZ and change lasix to iv bid. Add entresto and coreg Would do ischemic w/u as outpatient after he is on GDMT.  CHF complicated by significant MR which also limits forward cardiac output.   2. DM:  Poor control should see endocrinologist or at least primary at [facility] more frequently  3. HLD continue statin   4. Bipolar/PTSD:  On Cymbalta and Abilify he indicated not wanting to stay in hospital No family support and lives alone   Signed: Jenkins Rouge 01/05/2020, 12:44 PM

## 2020-01-05 NOTE — Progress Notes (Addendum)
Subjective:   Hospital day: 1  Overnight event: No acute event  Patient is seen at bedside.  He states that he is feeling bad because he did not have anything to eat and drink since morning.  He said that his breathing is the same but lower extremity edema has improved significantly.  Denies chest pain, abdominal pain, fever, chills, headache, leg pains.  Objective:  Vital signs in last 24 hours: Vitals:   01/04/20 1728 01/04/20 1800 01/04/20 1840 01/04/20 2152  BP: (!) 151/96 (!) 147/95  (!) 145/86  Pulse: (!) 113 (!) 115 (!) 107 (!) 109  Resp:    18  Temp: 97.7 F (36.5 C)   98 F (36.7 C)  TempSrc:    Oral  SpO2: 100% 97%  100%  Weight:      Height:       CBC Latest Ref Rng & Units 01/05/2020 01/04/2020 10/08/2016  WBC 4.0 - 10.5 K/uL 5.6 4.4 5.1  Hemoglobin 13.0 - 17.0 g/dL 13.8 12.6(L) 13.9  Hematocrit 39 - 52 % 43.9 41.7 43.2  Platelets 150 - 400 K/uL 214 198 195   CMP Latest Ref Rng & Units 01/05/2020 01/04/2020 10/08/2016  Glucose 70 - 99 mg/dL 129(H) 265(H) 198(H)  BUN 8 - 23 mg/dL 13 16 13   Creatinine 0.61 - 1.24 mg/dL 1.32(H) 1.27(H) 1.22  Sodium 135 - 145 mmol/L 142 139 142  Potassium 3.5 - 5.1 mmol/L 3.3(L) 3.7 4.0  Chloride 98 - 111 mmol/L 104 107 107  CO2 22 - 32 mmol/L 27 20(L) 23  Calcium 8.9 - 10.3 mg/dL 10.1 9.1 9.4  Total Protein 6.5 - 8.1 g/dL - - 7.1  Total Bilirubin 0.3 - 1.2 mg/dL - - 0.5  Alkaline Phos 38 - 126 U/L - - 67  AST 15 - 41 U/L - - 20  ALT 17 - 63 U/L - - 13(L)     Physical Exam  Physical Exam HENT:     Head: Normocephalic.  Eyes:     General:        Right eye: No discharge.        Left eye: No discharge.  Cardiovascular:     Rate and Rhythm: Regular rhythm. Tachycardia present.     Comments: Positive JVD Pulmonary:     Effort: No respiratory distress.  Abdominal:     General: Bowel sounds are normal.  Musculoskeletal:     Right lower leg: Edema (+1) present.     Left lower leg: Edema (+1) present.  Neurological:      Mental Status: He is alert.  Psychiatric:        Behavior: Behavior normal.     Assessment/Plan: [name] is a 64 year old male with past medical history of hypertension, type 2 diabetes on insulin, major depressive disorder, bipolar disorder, PTSD, renal cell carcinoma s/p nephrectomy, chronic knee pain who admitted hospital for bilateral lower extremity swelling and shortness of breath, found to have a large right pleural effusion, concerning for acute onset of heart failure versus malignancy associated pleural effusion.  Active Problems:   Diabetes type 2, controlled ([facility])   Bilateral lower extremity edema   Shortness of breath  Bilateral lower extremity edema Shortness of breath Right pleural effusion Echocardiogram showed EF 25-30% with diffuse hypokinesis worse in the inferior base.  Will consult cardiology for interventions.  Appreciate cardiology recommendations will hold off on thoracentesis. Patient had 1500 cc of urine output yesterday.  We will continue diuresis with IV Lasix 40  mg. -Appreciate cardiology recommendations -IV Lasix 40 mg daily -Continue HCTZ and lisinopril -Strict I&O -Daily weights   Type 2 diabetes Unknown A1c.  Home regimen includes Lantus 50 units daily with glipizide. -Check A1c -40 units Lantus daily -Sliding scale insulin -Monitor CBG   Hypertension -Resume HCTZ 25 mg daily -Resume lisinopril 20 mg daily   Hyperlipidemia -Resume atorvastatin 40 mg daily   Major depressive disorder Bipolar disorder PTSD His mood is currently stable -Resume Abilify 10 mg daily -Resume duloxetine 20 mg daily -Resume Lamictal 200 mg twice daily -Resume trazodone 50 mg at bedtime   Smoking Patient smoked 2 cigarettes a day for more than 10 years -Nicotine gum 2 mg   Diet: Heart diet IVF: N/A VTE: Lovenox CODE: Full  Prior to Admission Living Arrangement: Home Anticipated Discharge Location: To be  determined Barriers to Discharge: Work-up for heart failure Dispo: Anticipated discharge in approximately 2-3 day(s).   [name], DO 01/05/2020, 5:54 AM Pager: [phone] After 5pm on weekdays and 1pm on weekends: On Call pager [phone]

## 2020-01-05 NOTE — Plan of Care (Signed)

## 2020-01-06 DIAGNOSIS — E1165 Type 2 diabetes mellitus with hyperglycemia: Secondary | ICD-10-CM

## 2020-01-06 DIAGNOSIS — F329 Major depressive disorder, single episode, unspecified: Secondary | ICD-10-CM

## 2020-01-06 DIAGNOSIS — I5020 Unspecified systolic (congestive) heart failure: Secondary | ICD-10-CM | POA: Diagnosis present

## 2020-01-06 DIAGNOSIS — F17200 Nicotine dependence, unspecified, uncomplicated: Secondary | ICD-10-CM | POA: Diagnosis present

## 2020-01-06 DIAGNOSIS — I5021 Acute systolic (congestive) heart failure: Secondary | ICD-10-CM | POA: Diagnosis not present

## 2020-01-06 DIAGNOSIS — E669 Obesity, unspecified: Secondary | ICD-10-CM | POA: Diagnosis present

## 2020-01-06 DIAGNOSIS — I509 Heart failure, unspecified: Secondary | ICD-10-CM | POA: Diagnosis present

## 2020-01-06 LAB — HEPATIC FUNCTION PANEL
ALT: 22 U/L (ref 0–44)
AST: 23 U/L (ref 15–41)
Albumin: 2.9 g/dL — ABNORMAL LOW (ref 3.5–5.0)
Alkaline Phosphatase: 79 U/L (ref 38–126)
Bilirubin, Direct: 0.3 mg/dL — ABNORMAL HIGH (ref 0.0–0.2)
Indirect Bilirubin: 1.2 mg/dL — ABNORMAL HIGH (ref 0.3–0.9)
Total Bilirubin: 1.5 mg/dL — ABNORMAL HIGH (ref 0.3–1.2)
Total Protein: 6 g/dL — ABNORMAL LOW (ref 6.5–8.1)

## 2020-01-06 LAB — CBC
HCT: 40.2 % (ref 39.0–52.0)
Hemoglobin: 12.7 g/dL — ABNORMAL LOW (ref 13.0–17.0)
MCH: 25.6 pg — ABNORMAL LOW (ref 26.0–34.0)
MCHC: 31.6 g/dL (ref 30.0–36.0)
MCV: 81 fL (ref 80.0–100.0)
Platelets: 181 10*3/uL (ref 150–400)
RBC: 4.96 MIL/uL (ref 4.22–5.81)
RDW: 14.8 % (ref 11.5–15.5)
WBC: 4.3 10*3/uL (ref 4.0–10.5)
nRBC: 0 % (ref 0.0–0.2)

## 2020-01-06 LAB — BASIC METABOLIC PANEL
Anion gap: 11 (ref 5–15)
BUN: 16 mg/dL (ref 8–23)
CO2: 26 mmol/L (ref 22–32)
Calcium: 9.4 mg/dL (ref 8.9–10.3)
Chloride: 103 mmol/L (ref 98–111)
Creatinine, Ser: 1.45 mg/dL — ABNORMAL HIGH (ref 0.61–1.24)
GFR, Estimated: 54 mL/min — ABNORMAL LOW (ref >60)
Glucose, Bld: 149 mg/dL — ABNORMAL HIGH (ref 70–99)
Potassium: 3.1 mmol/L — ABNORMAL LOW (ref 3.5–5.1)
Sodium: 140 mmol/L (ref 135–145)

## 2020-01-06 LAB — GLUCOSE, CAPILLARY
Glucose-Capillary: 199 mg/dL — ABNORMAL HIGH (ref 70–99)
Glucose-Capillary: 77 mg/dL (ref 70–99)
Glucose-Capillary: 188 mg/dL — ABNORMAL HIGH (ref 70–99)
Glucose-Capillary: 126 mg/dL — ABNORMAL HIGH (ref 70–99)

## 2020-01-06 LAB — LACTATE DEHYDROGENASE: LDH: 183 U/L (ref 98–192)

## 2020-01-06 MED ORDER — FUROSEMIDE 40 MG PO TABS
40.0000 mg | ORAL_TABLET | Freq: Every day | ORAL | Status: DC
  Administered 2020-01-06 – 2020-01-07 (×2): 40 mg via ORAL
  Filled 2020-01-06 (×2): qty 1

## 2020-01-06 NOTE — Progress Notes (Signed)
Subjective:  Much improved after thoracentesis   Objective:  Vitals:   01/05/20 0831 01/05/20 2114 01/06/20 0551 01/06/20 1000  BP: (!) 142/79 118/75 (!) 97/56 98/69  Pulse: (!) 102 (!) 108 98 94  Resp: 18 18 18    Temp: 98.4 F (36.9 C) 98 F (36.7 C) 98.7 F (37.1 C)   TempSrc: Oral Oral Oral   SpO2: 100% 95% 96%   Weight:      Height:        Intake/Output from previous day:  Intake/Output Summary (Last 24 hours) at 01/06/2020 1005 Last data filed at 01/06/2020 0300 Gross per 24 hour  Intake 720 ml  Output 1350 ml  Net -630 ml    Physical Exam: Affect appropriate Chronically ill black male  HEENT: poor dentition  Neck supple with no adenopathy JVP elevated no bruits no thyromegaly Lungs clear with no wheezing and good diaphragmatic motion Heart:  S1/S2 MR  murmur, no rub, gallop or click PMI enlarted  Abdomen: benighn, BS positve, no tenderness, no AAA no bruit.  No HSM or HJR Distal pulses intact with no bruits Plus one bilateral  edema Neuro non-focal Skin warm and dry No muscular weakness   Lab Results: Basic Metabolic Panel: Recent Labs    01/05/20 0428 01/06/20 0628  NA 142 140  K 3.3* 3.1*  CL 104 103  CO2 27 26  GLUCOSE 129* 149*  BUN 13 16  CREATININE 1.32* 1.45*  CALCIUM 10.1 9.4   Liver Function Tests: Recent Labs    01/06/20 0628  AST 23  ALT 22  ALKPHOS 79  BILITOT 1.5*  PROT 6.0*  ALBUMIN 2.9*   No results for input(s): LIPASE, AMYLASE in the last 72 hours. CBC: Recent Labs    01/04/20 0850 01/04/20 0850 01/05/20 0428 01/06/20 0628  WBC 4.4   < > 5.6 4.3  NEUTROABS 2.9  --   --   --   HGB 12.6*   < > 13.8 12.7*  HCT 41.7   < > 43.9 40.2  MCV 83.9   < > 81.3 81.0  PLT 198   < > 214 181   < > = values in this interval not displayed.   Cardiac Enzymes: No results for input(s): CKTOTAL, CKMB, CKMBINDEX, TROPONINI in the last 72 hours. BNP: Invalid input(s): POCBNP D-Dimer: Recent Labs    01/04/20 0850   DDIMER 0.87*   Hemoglobin A1C: Recent Labs    01/05/20 0428  HGBA1C 9.9*     Imaging: CT Angio Chest PE W and/or Wo Contrast  Result Date: 01/04/2020 CLINICAL DATA:  Shortness of breath.  Chest pain. EXAM: CT ANGIOGRAPHY CHEST WITH CONTRAST TECHNIQUE: Multidetector CT imaging of the chest was performed using the standard protocol during bolus administration of intravenous contrast. Multiplanar CT image reconstructions and MIPs were obtained to evaluate the vascular anatomy. CONTRAST:  60mL OMNIPAQUE IOHEXOL 350 MG/ML SOLN COMPARISON:  Radiography same day FINDINGS: Cardiovascular: Heart size is normal. Mild aortic atherosclerotic calcification is noted. Some coronary artery calcification, particularly in the left system. Pulmonary arterial opacification is good. There are no pulmonary emboli. Mediastinum/Nodes: No mediastinal or hilar mass or lymphadenopathy. Lungs/Pleura: Moderate to large effusion on the right, layering dependently. Dependent pulmonary atelectasis. Subtotal right lower lobe collapse. Non dependent lung is clear. No abnormality on the left. Upper Abdomen: Nonspecific 1 cm low-density in the lateral aspect of the right lobe of liver. As an isolated finding, this is most consistent with a cyst or hemangioma.  This is not completely evaluated. Musculoskeletal: Normal Review of the MIP images confirms the above findings. IMPRESSION: 1. No pulmonary emboli. 2. Moderate to large right pleural effusion layering dependently. Dependent pulmonary atelectasis. Subtotal right lower lobe collapse. 3. Aortic atherosclerosis. Coronary artery calcification, particularly in the left system. 4. 1 cm low-density in the lateral aspect of the right lobe of the liver, not completely evaluated. As an isolated finding, this is most consistent with a cyst or hemangioma. Aortic Atherosclerosis (ICD10-I70.0). Electronically Signed   By: Nelson Chimes M.D.   On: 01/04/2020 12:39   DG Chest Port 1  View  Result Date: 01/05/2020 CLINICAL DATA:  Status post right thoracentesis. EXAM: PORTABLE CHEST 1 VIEW COMPARISON:  01/04/2020 FINDINGS: Improved aeration at the right lung base. Negative for a pneumothorax. Heart remains enlarged. No significant pulmonary edema. IMPRESSION: 1. Improved aeration in the right lung following the thoracentesis. 2. Negative for pneumothorax. 3. Stable cardiomegaly. Electronically Signed   By: Markus Daft M.D.   On: 01/05/2020 14:05   ECHOCARDIOGRAM COMPLETE  Result Date: 01/05/2020    ECHOCARDIOGRAM REPORT   Patient Name:   [name] Date of Exam: 01/05/2020 Medical Rec #:  [id number]        Height:       70.0 in Accession #:    [id number]       Weight:       212.1 lb Date of Birth:  02/10/1956        BSA:          2.140 m Patient Age:    64 years         BP:           142/79 mmHg Patient Gender: M                HR:           105 bpm. Exam Location:  Inpatient Procedure: 2D Echo Indications:    elevated troponin  History:        Patient has no prior history of Echocardiogram examinations.                 Abnormal ECG, Signs/Symptoms:Shortness of Breath and lower                 extremity edema; Risk Factors:Diabetes.  Sonographer:    Johny Chess Referring Phys: [id number] [place]  1. Diffuse hypokinesis worse in the inferior base. Left ventricular ejection fraction, by estimation, is 25 to 30%. The left ventricle has severely decreased function. The left ventricle demonstrates global hypokinesis. The left ventricular internal cavity size was mildly dilated. Left ventricular diastolic parameters were normal.  2. Right ventricular systolic function is normal. The right ventricular size is normal. There is mildly elevated pulmonary artery systolic pressure.  3. Left atrial size was moderately dilated.  4. Right atrial size was mildly dilated.  5. The mitral valve is abnormal. Moderate mitral valve regurgitation. No evidence of mitral stenosis.  6.  Tricuspid valve regurgitation is moderate.  7. The aortic valve is tricuspid. Aortic valve regurgitation is not visualized. Mild aortic valve sclerosis is present, with no evidence of aortic valve stenosis.  8. The inferior vena cava is dilated in size with <50% respiratory variability, suggesting right atrial pressure of 15 mmHg. FINDINGS  Left Ventricle: Diffuse hypokinesis worse in the inferior base. Left ventricular ejection fraction, by estimation, is 25 to 30%. The left ventricle has severely decreased function. The left ventricle demonstrates  global hypokinesis. The left ventricular  internal cavity size was mildly dilated. There is no left ventricular hypertrophy. Left ventricular diastolic parameters were normal. Right Ventricle: The right ventricular size is normal. No increase in right ventricular wall thickness. Right ventricular systolic function is normal. There is mildly elevated pulmonary artery systolic pressure. The tricuspid regurgitant velocity is 2.65  m/s, and with an assumed right atrial pressure of 8 mmHg, the estimated right ventricular systolic pressure is 54.6 mmHg. Left Atrium: Left atrial size was moderately dilated. Right Atrium: Right atrial size was mildly dilated. Pericardium: There is no evidence of pericardial effusion. Mitral Valve: The mitral valve is abnormal. Moderate mitral valve regurgitation. No evidence of mitral valve stenosis. Tricuspid Valve: The tricuspid valve is normal in structure. Tricuspid valve regurgitation is moderate . No evidence of tricuspid stenosis. Aortic Valve: The aortic valve is tricuspid. Aortic valve regurgitation is not visualized. Mild aortic valve sclerosis is present, with no evidence of aortic valve stenosis. Pulmonic Valve: The pulmonic valve was normal in structure. Pulmonic valve regurgitation is not visualized. No evidence of pulmonic stenosis. Aorta: The aortic root is normal in size and structure. Venous: The inferior vena cava is dilated  in size with less than 50% respiratory variability, suggesting right atrial pressure of 15 mmHg. IAS/Shunts: No atrial level shunt detected by color flow Doppler.  LEFT VENTRICLE PLAX 2D LVIDd:         5.30 cm LVIDs:         4.40 cm LV PW:         1.10 cm LV IVS:        0.70 cm LVOT diam:     2.10 cm LV SV:         36 LV SV Index:   17 LVOT Area:     3.46 cm  LV Volumes (MOD) LV vol d, MOD A2C: 116.0 ml LV vol d, MOD A4C: 162.0 ml LV vol s, MOD A2C: 83.4 ml LV vol s, MOD A4C: 115.0 ml LV SV MOD A2C:     32.6 ml LV SV MOD A4C:     162.0 ml LV SV MOD BP:      44.2 ml RIGHT VENTRICLE             IVC RV S prime:     12.40 cm/s  IVC diam: 2.30 cm TAPSE (M-mode): 1.5 cm LEFT ATRIUM             Index       RIGHT ATRIUM           Index LA diam:        4.80 cm 2.24 cm/m  RA Area:     20.40 cm LA Vol (A2C):   93.4 ml 43.64 ml/m RA Volume:   59.20 ml  27.66 ml/m LA Vol (A4C):   67.7 ml 31.64 ml/m LA Biplane Vol: 79.2 ml 37.01 ml/m  AORTIC VALVE LVOT Vmax:   72.50 cm/s LVOT Vmean:  46.600 cm/s LVOT VTI:    0.104 m  AORTA Ao Root diam: 2.80 cm Ao Asc diam:  3.50 cm MITRAL VALVE                 TRICUSPID VALVE MV Area (PHT): 4.80 cm      TR Peak grad:   28.1 mmHg MV Decel Time: 158 msec      TR Vmax:        265.00 cm/s MR Peak grad:    89.9 mmHg MR  Mean grad:    55.0 mmHg   SHUNTS MR Vmax:         474.00 cm/s Systemic VTI:  0.10 m MR Vmean:        343.0 cm/s  Systemic Diam: 2.10 cm MR PISA:         1.01 cm MR PISA Eff ROA: 8 mm MR PISA Radius:  0.40 cm MV E velocity: 97.40 cm/s MV A velocity: 76.60 cm/s MV E/A ratio:  1.27 Jenkins Rouge MD Electronically signed by Jenkins Rouge MD Signature Date/Time: 01/05/2020/11:24:14 AM    Final    US THORACENTESIS ASP PLEURAL SPACE W/IMG GUIDE  Result Date: 01/05/2020 INDICATION: Patient with history of new onset systolic HF, dyspnea, and right pleural effusion. Request is made for diagnostic and therapeutic right thoracentesis. EXAM: ULTRASOUND GUIDED DIAGNOSTIC AND THERAPEUTIC  RIGHT THORACENTESIS MEDICATIONS: 8 mL 1% lidocaine COMPLICATIONS: None immediate. PROCEDURE: An ultrasound guided thoracentesis was thoroughly discussed with the patient and questions answered. The benefits, risks, alternatives and complications were also discussed. The patient understands and wishes to proceed with the procedure. Written consent was obtained. Ultrasound was performed to localize and mark an adequate pocket of fluid in the right chest. The area was then prepped and draped in the normal sterile fashion. 1% Lidocaine was used for local anesthesia. Under ultrasound guidance a 6 Fr Safe-T-Centesis catheter was introduced by Dr. Anselm Pancoast. Thoracentesis was performed. The catheter was removed and a dressing applied. FINDINGS: A total of approximately 750 mL of hazy gold fluid was removed. Samples were sent to the laboratory as requested by the clinical team. IMPRESSION: Successful ultrasound guided right thoracentesis yielding 750 mL of pleural fluid. Read by: Earley Abide, PA-C Electronically Signed   By: Markus Daft M.D.   On: 01/05/2020 14:05    Cardiac Studies:  ECG: ST rate 111 nonspecific  ST changes    Telemetry:  NSR 01/06/2020   Echo: EF 25-30% moderate MR moderate TR   Medications:   . ARIPiprazole  10 mg Oral Daily  . atorvastatin  40 mg Oral Daily  . carvedilol  3.125 mg Oral BID WC  . docusate sodium  100 mg Oral BID  . DULoxetine  20 mg Oral Daily  . enoxaparin (LOVENOX) injection  40 mg Subcutaneous Q24H  . furosemide  40 mg Intravenous BID  . insulin aspart  0-15 Units Subcutaneous TID WC  . insulin glargine  40 Units Subcutaneous QHS  . lamoTRIgine  200 mg Oral BID  . [START ON 01/07/2020] sacubitril-valsartan  1 tablet Oral BID  . senna  1 tablet Oral BID      Assessment/Plan:    1. CHF:  Acute systolic complicated by moderate MR/TR. Good symptoms relief with right thoracentesis 700 cc yesterday Post CXR improved aeration no pneumothorax Continue lasix change to  PO Continue coreg Will get bridging ARB today and first dose Entresto tomorrow Follow BMET. Would not d/c until Tuesday am make sure entresto tolerated and can afford.  Will need outpatient ischemic evaluation after on GDMT for 4-6 weeks No chest pain to suggest ischemic DCM but poorly controlled diabetic.   Jenkins Rouge 01/06/2020, 10:05 AM

## 2020-01-06 NOTE — Progress Notes (Addendum)
   Interm history/ Subjective:   Underwent right thoracentesis per IR yesterday---700cc removed.  Yesterday's cardiology note reviewed--planning for outpatient ischemic workup  Objective:  Vital signs in last 24 hours: Vitals:   01/04/20 2152 01/05/20 0556 01/05/20 0831 01/05/20 2114  BP: (!) 145/86 119/83 (!) 142/79 118/75  Pulse: (!) 109 (!) 105 (!) 102 (!) 108  Resp: 18 18 18 18   Temp: 98 F (36.7 C) 98.2 F (36.8 C) 98.4 F (36.9 C) 98 F (36.7 C)  TempSrc: Oral Oral Oral Oral  SpO2: 100% 97% 100% 95%  Weight:      Height:       CBC Latest Ref Rng & Units 01/05/2020 01/04/2020 10/08/2016  WBC 4.0 - 10.5 K/uL 5.6 4.4 5.1  Hemoglobin 13.0 - 17.0 g/dL 13.8 12.6(L) 13.9  Hematocrit 39 - 52 % 43.9 41.7 43.2  Platelets 150 - 400 K/uL 214 198 195   CMP Latest Ref Rng & Units 01/05/2020 01/04/2020 10/08/2016  Glucose 70 - 99 mg/dL 129(H) 265(H) 198(H)  BUN 8 - 23 mg/dL 13 16 13   Creatinine 0.61 - 1.24 mg/dL 1.32(H) 1.27(H) 1.22  Sodium 135 - 145 mmol/L 142 139 142  Potassium 3.5 - 5.1 mmol/L 3.3(L) 3.7 4.0  Chloride 98 - 111 mmol/L 104 107 107  CO2 22 - 32 mmol/L 27 20(L) 23  Calcium 8.9 - 10.3 mg/dL 10.1 9.1 9.4  Total Protein 6.5 - 8.1 g/dL - - 7.1  Total Bilirubin 0.3 - 1.2 mg/dL - - 0.5  Alkaline Phos 38 - 126 U/L - - 67  AST 15 - 41 U/L - - 20  ALT 17 - 63 U/L - - 13(L)     Physical Exam General: well appearing in NAD Cardiac:RRR, +1 edema in the bilateral lower extremities Pulm: breathing comfortably on room air. No adventitious lung sounds present on auscultation   Assessment/Plan: [name] is a 64 year old male with past medical history of hypertension, type 2 diabetes on insulin, major depressive disorder, bipolar disorder, PTSD, renal cell carcinoma s/p nephrectomy, chronic knee pain who admitted hospital for bilateral lower extremity swelling and shortness of breath, found to have a large right pleural effusion, concerning for acute onset of heart  failure versus malignancy associated pleural effusion.  Active Problems:   Diabetes type 2, controlled (HCC)   Bilateral lower extremity edema   Shortness of breath   Acute decompensated heart failure with reduced EF (25-30%) (newly diagnosed) Transudative right pleural effusion s/p thoracentesis (11/20). 700cc removed. Transudate nature confirmed with labs.. remains on RA. Chronic hypertension and hyperlipidemia -management per cardiology. Continue coreg, entresto, lipitor. Diuresis per cardiology. -Cardiology planning for outpatient ischemic workup  Uncontrolled Type 2 diabetes. Continue lantus 40U daily, SSI. Recommend primary care provider start SGLT2.  Major depressive disorder Bipolar disorder PTSD Continue abilify, duloxetine, lamictal, trazodone  Smoking Patient smoked 2 cigarettes a day for more than 10 years -Nicotine gum 2 mg -encourage cessation   Diet: Heart diet IVF: N/A VTE: Lovenox CODE: Full  Prior to Admission Living Arrangement: Home Anticipated Discharge Location: To be determined Barriers to Discharge: none from medicine. Cardiology requesting he wait until Tuesday for discharge. Dispo: 11/23  Kevin Hansen, MD Internal Medicine Resident PGY-2 [name] Pager: [phone] 01/06/2020 5:52 AM    After 5pm on weekdays and 1pm on weekends: On Call pager [phone]

## 2020-01-06 NOTE — Plan of Care (Signed)

## 2020-01-07 DIAGNOSIS — Z9889 Other specified postprocedural states: Secondary | ICD-10-CM

## 2020-01-07 DIAGNOSIS — M25561 Pain in right knee: Secondary | ICD-10-CM

## 2020-01-07 DIAGNOSIS — R000 Tachycardia, unspecified: Secondary | ICD-10-CM

## 2020-01-07 DIAGNOSIS — M25562 Pain in left knee: Secondary | ICD-10-CM

## 2020-01-07 DIAGNOSIS — I5020 Unspecified systolic (congestive) heart failure: Secondary | ICD-10-CM

## 2020-01-07 DIAGNOSIS — E119 Type 2 diabetes mellitus without complications: Secondary | ICD-10-CM

## 2020-01-07 DIAGNOSIS — E785 Hyperlipidemia, unspecified: Secondary | ICD-10-CM | POA: Diagnosis not present

## 2020-01-07 DIAGNOSIS — F17200 Nicotine dependence, unspecified, uncomplicated: Secondary | ICD-10-CM | POA: Diagnosis not present

## 2020-01-07 DIAGNOSIS — R600 Localized edema: Secondary | ICD-10-CM

## 2020-01-07 DIAGNOSIS — Z794 Long term (current) use of insulin: Secondary | ICD-10-CM

## 2020-01-07 DIAGNOSIS — I5023 Acute on chronic systolic (congestive) heart failure: Secondary | ICD-10-CM

## 2020-01-07 DIAGNOSIS — I110 Hypertensive heart disease with heart failure: Principal | ICD-10-CM

## 2020-01-07 DIAGNOSIS — F4310 Post-traumatic stress disorder, unspecified: Secondary | ICD-10-CM

## 2020-01-07 DIAGNOSIS — F17210 Nicotine dependence, cigarettes, uncomplicated: Secondary | ICD-10-CM

## 2020-01-07 DIAGNOSIS — F3289 Other specified depressive episodes: Secondary | ICD-10-CM

## 2020-01-07 DIAGNOSIS — I10 Essential (primary) hypertension: Secondary | ICD-10-CM

## 2020-01-07 DIAGNOSIS — E1169 Type 2 diabetes mellitus with other specified complication: Secondary | ICD-10-CM

## 2020-01-07 DIAGNOSIS — N179 Acute kidney failure, unspecified: Secondary | ICD-10-CM

## 2020-01-07 LAB — CBC
HCT: 40.9 % (ref 39.0–52.0)
Hemoglobin: 12.7 g/dL — ABNORMAL LOW (ref 13.0–17.0)
MCH: 25.5 pg — ABNORMAL LOW (ref 26.0–34.0)
MCHC: 31.1 g/dL (ref 30.0–36.0)
MCV: 82.1 fL (ref 80.0–100.0)
Platelets: 176 10*3/uL (ref 150–400)
RBC: 4.98 MIL/uL (ref 4.22–5.81)
RDW: 14.8 % (ref 11.5–15.5)
WBC: 4.1 10*3/uL (ref 4.0–10.5)
nRBC: 0 % (ref 0.0–0.2)

## 2020-01-07 LAB — GLUCOSE, CAPILLARY
Glucose-Capillary: 57 mg/dL — ABNORMAL LOW (ref 70–99)
Glucose-Capillary: 99 mg/dL (ref 70–99)
Glucose-Capillary: 201 mg/dL — ABNORMAL HIGH (ref 70–99)
Glucose-Capillary: 161 mg/dL — ABNORMAL HIGH (ref 70–99)
Glucose-Capillary: 131 mg/dL — ABNORMAL HIGH (ref 70–99)
Glucose-Capillary: 64 mg/dL — ABNORMAL LOW (ref 70–99)
Glucose-Capillary: 215 mg/dL — ABNORMAL HIGH (ref 70–99)

## 2020-01-07 LAB — BASIC METABOLIC PANEL
Anion gap: 10 (ref 5–15)
BUN: 17 mg/dL (ref 8–23)
CO2: 28 mmol/L (ref 22–32)
Calcium: 8.9 mg/dL (ref 8.9–10.3)
Chloride: 101 mmol/L (ref 98–111)
Creatinine, Ser: 1.55 mg/dL — ABNORMAL HIGH (ref 0.61–1.24)
GFR, Estimated: 50 mL/min — ABNORMAL LOW (ref >60)
Glucose, Bld: 202 mg/dL — ABNORMAL HIGH (ref 70–99)
Potassium: 3.5 mmol/L (ref 3.5–5.1)
Sodium: 139 mmol/L (ref 135–145)

## 2020-01-07 MED ORDER — FUROSEMIDE 20 MG PO TABS
20.0000 mg | ORAL_TABLET | Freq: Every day | ORAL | Status: DC
  Administered 2020-01-08 – 2020-01-09 (×2): 20 mg via ORAL
  Filled 2020-01-07 (×2): qty 1

## 2020-01-07 MED ORDER — INSULIN GLARGINE 100 UNIT/ML ~~LOC~~ SOLN
35.0000 [IU] | Freq: Every day | SUBCUTANEOUS | Status: DC
  Filled 2020-01-07: qty 0.35

## 2020-01-07 MED ORDER — INSULIN GLARGINE 100 UNIT/ML ~~LOC~~ SOLN
30.0000 [IU] | Freq: Every day | SUBCUTANEOUS | Status: DC
  Administered 2020-01-07: 30 [IU] via SUBCUTANEOUS
  Filled 2020-01-07 (×2): qty 0.3

## 2020-01-07 NOTE — Progress Notes (Addendum)
Subjective:   Hospital day: 2  Overnight event: No acute event  Patient is seen at bedside.  He states that his breathing has significantly improved after the thoracentesis.  His lower extremity edema also better.  Patient is following the [place] for primary care physician next follow-up appointment is December 7.  He also states that he is giving 30 units of Lantus daily at home.  Objective:  Vital signs in last 24 hours: Vitals:   01/06/20 1000 01/06/20 1339 01/06/20 1626 01/06/20 2011  BP: 98/69 (!) 141/125 110/68 101/72  Pulse: 94 (!) 103 (!) 108 (!) 104  Resp:  18  16  Temp:  98.1 F (36.7 C)  98.7 F (37.1 C)  TempSrc:    Oral  SpO2:  96%  97%  Weight:      Height:       CBC Latest Ref Rng & Units 01/06/2020 01/05/2020 01/04/2020  WBC 4.0 - 10.5 K/uL 4.3 5.6 4.4  Hemoglobin 13.0 - 17.0 g/dL 12.7(L) 13.8 12.6(L)  Hematocrit 39 - 52 % 40.2 43.9 41.7  Platelets 150 - 400 K/uL 181 214 198   CMP Latest Ref Rng & Units 01/06/2020 01/05/2020 01/04/2020  Glucose 70 - 99 mg/dL 149(H) 129(H) 265(H)  BUN 8 - 23 mg/dL 16 13 16   Creatinine 0.61 - 1.24 mg/dL 1.45(H) 1.32(H) 1.27(H)  Sodium 135 - 145 mmol/L 140 142 139  Potassium 3.5 - 5.1 mmol/L 3.1(L) 3.3(L) 3.7  Chloride 98 - 111 mmol/L 103 104 107  CO2 22 - 32 mmol/L 26 27 20(L)  Calcium 8.9 - 10.3 mg/dL 9.4 10.1 9.1  Total Protein 6.5 - 8.1 g/dL 6.0(L) - -  Total Bilirubin 0.3 - 1.2 mg/dL 1.5(H) - -  Alkaline Phos 38 - 126 U/L 79 - -  AST 15 - 41 U/L 23 - -  ALT 0 - 44 U/L 22 - -     Physical Exam  Physical Exam Constitutional:      General: He is not in acute distress.    Appearance: Normal appearance.     Comments: Pleasant  HENT:     Head: Normocephalic.  Eyes:     General:        Right eye: No discharge.        Left eye: No discharge.  Cardiovascular:     Rate and Rhythm: Regular rhythm. Tachycardia present.     Comments: +JVD Pulmonary:     Effort: No respiratory distress.     Breath sounds: Normal  breath sounds.  Abdominal:     General: Bowel sounds are normal.     Palpations: Abdomen is soft.     Tenderness: There is no abdominal tenderness.  Musculoskeletal:     Right lower leg: Edema (+1) present.     Left lower leg: Edema (+1) present.  Skin:    General: Skin is warm.  Neurological:     Mental Status: He is alert.  Psychiatric:        Mood and Affect: Mood normal.     Assessment/Plan: Regionald Canipe is a 64 year old male with past medical history of hypertension, type 2 diabetes on insulin, major depressive disorder, bipolar disorder, PTSD, renal cell carcinoma s/p nephrectomy, chronic knee pain who admitted hospital for bilateral lower extremity swelling and shortness of breath, found to have a large right pleural effusion due to acute onset of HFrEF.   Principal Problem:   HFrEF (heart failure with reduced ejection fraction) (HCC) Active Problems:   HLD (  hyperlipidemia)   Essential hypertension   Diabetes type 2, controlled (HCC)   Bilateral lower extremity edema   Shortness of breath   Tobacco use disorder   Obesity  Acute decompensated heart failure with reduced EF (25-30%) (newly diagnosed) Transudative right pleural effusion s/p thoracentesis (11/20). 700cc removed. Transudate nature confirmed with labs. Remains on RA. Chronic hypertension and hyperlipidemia -Starting Entresto today. Follow BMP -Continue coreg, lipitor.  -Diuresis with PO Lasix 40 mg daily. 1300cc of urine output yesterday.  Reduce Lasix to 20 mg p.o. per cardiology.  Patient still appears overloaded on exam. -BMP in am  -Cardiology planning for outpatient ischemic workup -No MRA given AKI    Uncontrolled Type 2 diabetes. CBG on the lower side. One episode of 78.  -Patient is actually on 30 units of Lantus daily at home (not 50 units) -Reduce lantus to 30U daily, SSI.  -Recommend primary care provider start SGLT2.    Major depressive disorder Bipolar disorder PTSD Continue  abilify, duloxetine, lamictal, trazodone    Smoking Patient smoked 2 cigarettes a day for more than 10 years -Nicotine gum 2 mg -encourage cessation     Diet: Heart diet IVF: N/A VTE: Lovenox CODE: Full   Prior to Admission Living Arrangement: Home Anticipated Discharge Location: Home Barriers to Discharge:  Monitor BMP after starting Entresto  Dispo: 11/23  Kevin Gerold, DO 01/07/2020, 6:33 AM Pager: [phone] After 5pm on weekdays and 1pm on weekends: On Call pager [phone]

## 2020-01-07 NOTE — Progress Notes (Signed)
Progress Note  Patient Name: [name] Date of Encounter: 01/07/2020  Primary Cardiologist: No primary care provider on file.   Subjective   64 yo M hx DM with HTN, HLD, Tobacco Abuse, Bipolar, prior renal cell cancer s/p nephrecxtomy.  Presentes with DOE and LE Edema, Found to have new HFrEF 25-50% with moderate TR and Moderate MR, TR.   Through course received IV Lasix, with transition to [employer] and Coreg.  Discussion with patient and shared decision making 11/20:  Deferring ischemic eval until outpatient after GDMT.  11/21 IR Thoracentesis 700 removed.  Patient notes no SOB, DOE.  Unclear of his weight loss, but notes he is urinating.  Inpatient Medications    Scheduled Meds:  ARIPiprazole  10 mg Oral Daily   atorvastatin  40 mg Oral Daily   carvedilol  3.125 mg Oral BID WC   docusate sodium  100 mg Oral BID   DULoxetine  20 mg Oral Daily   enoxaparin (LOVENOX) injection  40 mg Subcutaneous Q24H   furosemide  40 mg Oral Daily   insulin aspart  0-15 Units Subcutaneous TID WC   insulin glargine  35 Units Subcutaneous QHS   lamoTRIgine  200 mg Oral BID   sacubitril-valsartan  1 tablet Oral BID   senna  1 tablet Oral BID   Continuous Infusions:   PRN Meds: acetaminophen **OR** acetaminophen, albuterol, gabapentin, nicotine polacrilex, traZODone   Vital Signs    Vitals:   01/06/20 1339 01/06/20 1626 01/06/20 2011 01/07/20 0956  BP: (!) 141/125 110/68 101/72 (!) 103/56  Pulse: (!) 103 (!) 108 (!) 104 73  Resp: 18  16   Temp: 98.1 F (36.7 C)  98.7 F (37.1 C)   TempSrc:   Oral   SpO2: 96%  97%   Weight:      Height:       No intake or output data in the 24 hours ending 01/07/20 1032 Filed Weights   01/04/20 0854  Weight: 212 lb 1.3 oz (96.2 kg)    Telemetry    Sinus rhythm - Personally Reviewed  ECG    Sinus Tachycardia rate 113 with TWI inferior leads from 01/04/20 - Personally Reviewed  Physical Exam   GEN: No acute distress.   Neck: No  JVD Cardiac: RRR, III/VI holosystolic murmur, rubs, or gallops.  Respiratory: Clear to auscultation bilaterally. GI: Soft, nontender, non-distended  MS: 1 pitting  edema in feet bilaterally; No deformity. Neuro:  Nonfocal  Psych: Normal affect   Labs    Chemistry Recent Labs  Lab 01/05/20 0428 01/06/20 0628 01/07/20 0838  NA 142 140 139  K 3.3* 3.1* 3.5  CL 104 103 101  CO2 27 26 28   GLUCOSE 129* 149* 202*  BUN 13 16 17   CREATININE 1.32* 1.45* 1.55*  CALCIUM 10.1 9.4 8.9  PROT  --  6.0*  --   ALBUMIN  --  2.9*  --   AST  --  23  --   ALT  --  22  --   ALKPHOS  --  79  --   BILITOT  --  1.5*  --   GFRNONAA >60 54* 50*  ANIONGAP 11 11 10      Hematology Recent Labs  Lab 01/05/20 0428 01/06/20 0628 01/07/20 0838  WBC 5.6 4.3 4.1  RBC 5.40 4.96 4.98  HGB 13.8 12.7* 12.7*  HCT 43.9 40.2 40.9  MCV 81.3 81.0 82.1  MCH 25.6* 25.6* 25.5*  MCHC 31.4 31.6 31.1  RDW 14.9 14.8 14.8  PLT 214 181 176    Cardiac EnzymesNo results for input(s): TROPONINI in the last 168 hours. No results for input(s): TROPIPOC in the last 168 hours.   BNP Recent Labs  Lab 01/04/20 0850  BNP 560.0*     DDimer  Recent Labs  Lab 01/04/20 0850  DDIMER 0.87*     Radiology    DG Chest Port 1 View  Result Date: 01/05/2020 CLINICAL DATA:  Status post right thoracentesis. EXAM: PORTABLE CHEST 1 VIEW COMPARISON:  01/04/2020 FINDINGS: Improved aeration at the right lung base. Negative for a pneumothorax. Heart remains enlarged. No significant pulmonary edema. IMPRESSION: 1. Improved aeration in the right lung following the thoracentesis. 2. Negative for pneumothorax. 3. Stable cardiomegaly. Electronically Signed   By: Markus Daft M.D.   On: 01/05/2020 14:05   US THORACENTESIS ASP PLEURAL SPACE W/IMG GUIDE  Result Date: 01/05/2020 INDICATION: Patient with history of new onset systolic HF, dyspnea, and right pleural effusion. Request is made for diagnostic and therapeutic right  thoracentesis. EXAM: ULTRASOUND GUIDED DIAGNOSTIC AND THERAPEUTIC RIGHT THORACENTESIS MEDICATIONS: 8 mL 1% lidocaine COMPLICATIONS: None immediate. PROCEDURE: An ultrasound guided thoracentesis was thoroughly discussed with the patient and questions answered. The benefits, risks, alternatives and complications were also discussed. The patient understands and wishes to proceed with the procedure. Written consent was obtained. Ultrasound was performed to localize and mark an adequate pocket of fluid in the right chest. The area was then prepped and draped in the normal sterile fashion. 1% Lidocaine was used for local anesthesia. Under ultrasound guidance a 6 Fr Safe-T-Centesis catheter was introduced by Dr. Anselm Pancoast. Thoracentesis was performed. The catheter was removed and a dressing applied. FINDINGS: A total of approximately 750 mL of hazy gold fluid was removed. Samples were sent to the laboratory as requested by the clinical team. IMPRESSION: Successful ultrasound guided right thoracentesis yielding 750 mL of pleural fluid. Read by: Earley Abide, PA-C Electronically Signed   By: Markus Daft M.D.   On: 01/05/2020 14:05     Cardiac Studies   Echo 01/05/20- Moderate MR, TR, EF 25% mild LV dilation Patient Profile     64 y.o. male DM with HTN, HLD Tobacco Abuse Bipoloar and former renal cell s/p nephrectomy who presents with new HF  Assessment & Plan    Acute Decompensated Heart Failure with Reduced Ejection Fraction  Moderate MR Moderate TR DM with HTN Tobacco Abuse Hypokalemia AKI - NYHA class II, Stage C, euvolemic, etiology unclear - Unclear outputs and weights - would reduce lasix to 20 mg PO - Strict I/Os, daily weights, and fluid restriction of < 2 L  - Replace electrolytes PRN and keep K>4 and Mg>2. - Daily BMP, Mg. - continue COREG 3.125 - continue Entresto 24/26 - given AKI; will hold off MRA initiation - patient discussed with primary cardiologist outpatient ischemic work up/  reasonable for outpatient re-assessment - Consider outpatient SGLT2i  - would benefit from both [facility] and [facility] follow up (as with his service connection they should be able to pay for his [name])    For questions or updates, please contact [facility] Please consult [email or web address] for contact info under Cardiology/STEMI.      Signed, Werner Lean, MD  01/07/2020, 10:32 AM

## 2020-01-08 ENCOUNTER — Inpatient Hospital Stay (HOSPITAL_COMMUNITY): Payer: No Typology Code available for payment source

## 2020-01-08 DIAGNOSIS — I5020 Unspecified systolic (congestive) heart failure: Secondary | ICD-10-CM | POA: Diagnosis not present

## 2020-01-08 DIAGNOSIS — E11649 Type 2 diabetes mellitus with hypoglycemia without coma: Secondary | ICD-10-CM

## 2020-01-08 DIAGNOSIS — R0602 Shortness of breath: Secondary | ICD-10-CM | POA: Diagnosis not present

## 2020-01-08 DIAGNOSIS — F17200 Nicotine dependence, unspecified, uncomplicated: Secondary | ICD-10-CM | POA: Diagnosis not present

## 2020-01-08 DIAGNOSIS — E119 Type 2 diabetes mellitus without complications: Secondary | ICD-10-CM | POA: Diagnosis not present

## 2020-01-08 LAB — BASIC METABOLIC PANEL
Anion gap: 12 (ref 5–15)
BUN: 16 mg/dL (ref 8–23)
CO2: 25 mmol/L (ref 22–32)
Calcium: 9 mg/dL (ref 8.9–10.3)
Chloride: 101 mmol/L (ref 98–111)
Creatinine, Ser: 1.37 mg/dL — ABNORMAL HIGH (ref 0.61–1.24)
GFR, Estimated: 58 mL/min — ABNORMAL LOW (ref >60)
Glucose, Bld: 71 mg/dL (ref 70–99)
Potassium: 3.9 mmol/L (ref 3.5–5.1)
Sodium: 138 mmol/L (ref 135–145)

## 2020-01-08 LAB — PH, BODY FLUID: pH, Body Fluid: 7.6

## 2020-01-08 LAB — GLUCOSE, CAPILLARY
Glucose-Capillary: 59 mg/dL — ABNORMAL LOW (ref 70–99)
Glucose-Capillary: 184 mg/dL — ABNORMAL HIGH (ref 70–99)
Glucose-Capillary: 165 mg/dL — ABNORMAL HIGH (ref 70–99)
Glucose-Capillary: 181 mg/dL — ABNORMAL HIGH (ref 70–99)
Glucose-Capillary: 256 mg/dL — ABNORMAL HIGH (ref 70–99)

## 2020-01-08 LAB — CYTOLOGY - NON PAP

## 2020-01-08 MED ORDER — CARVEDILOL 3.125 MG PO TABS
3.1250 mg | ORAL_TABLET | Freq: Two times a day (BID) | ORAL | Status: DC
  Administered 2020-01-08 – 2020-01-14 (×12): 3.125 mg via ORAL
  Filled 2020-01-08 (×11): qty 1

## 2020-01-08 MED ORDER — INSULIN GLARGINE 100 UNIT/ML ~~LOC~~ SOLN
20.0000 [IU] | Freq: Every day | SUBCUTANEOUS | Status: DC
  Administered 2020-01-08: 20 [IU] via SUBCUTANEOUS
  Filled 2020-01-08 (×2): qty 0.2

## 2020-01-08 MED ORDER — CARVEDILOL 6.25 MG PO TABS: 6.2500 mg | ORAL_TABLET | Freq: Two times a day (BID) | ORAL | Status: DC

## 2020-01-08 MED ORDER — INSULIN GLARGINE 100 UNIT/ML ~~LOC~~ SOLN
25.0000 [IU] | Freq: Every day | SUBCUTANEOUS | Status: DC
  Filled 2020-01-08: qty 0.25

## 2020-01-08 NOTE — TOC Initial Note (Signed)
Transition of Care [facility]) - Initial/Assessment Note    Patient Details  Name: [name] MRN: [id number] Date of Birth: [birth year]  Transition of [facility]) CM/SW Contact:    Angelita Ingles, RN Phone Number: [phone]  01/08/2020, 3:56 PM  Clinical Narrative:                 Patient comes from home where he lives alone. Patient states that he manages at home and is able to arrange transportation when needed. Patient states that he does not want to go to SNF and feels that he is able to manage on his own. Patient does not give CM consent to initiate bed search for recommended SNF because he states he is going home. TOC will continue to follow for any other needs.   Expected Discharge Plan: Home/Self Care Barriers to Discharge: Continued Medical Work up   Patient Goals and CMS Choice Patient states their goals for this hospitalization and ongoing recovery are:: Wants to get better and go home CMS Medicare.gov Compare Post Acute Care list provided to::  (patient refused)    Expected Discharge Plan and Services Expected Discharge Plan: Home/Self Care In-house Referral: NA Discharge Planning Services: CM Consult   Living arrangements for the past 2 months: Single Family Home                 DME Arranged: N/A DME Agency: NA       HH Arranged: NA HH Agency: NA        Prior Living Arrangements/Services Living arrangements for the past 2 months: Single Family Home Lives with:: Self Patient language and need for interpreter reviewed:: Yes        Need for Family Participation in Patient Care: Yes (Comment) Care giver support system in place?: Yes (comment) Current home services:  (none) Criminal Activity/Legal Involvement Pertinent to Current Situation/Hospitalization: No - Comment as needed  Activities of Daily Living Home Assistive Devices/Equipment: None ADL Screening (condition at time of admission) Patient's cognitive ability adequate to safely complete daily  activities?: Yes Is the patient deaf or have difficulty hearing?: No Does the patient have difficulty seeing, even when wearing glasses/contacts?: No Does the patient have difficulty concentrating, remembering, or making decisions?: No Patient able to express need for assistance with ADLs?: No Does the patient have difficulty dressing or bathing?: No Independently performs ADLs?: Yes (appropriate for developmental age) Does the patient have difficulty walking or climbing stairs?: Yes Weakness of Legs: Both Weakness of Arms/Hands: None  Permission Sought/Granted   Permission granted to share information with : No              Emotional Assessment Appearance:: Appears stated age Attitude/Demeanor/Rapport: Gracious Affect (typically observed): Pleasant Orientation: : Oriented to Self, Oriented to Place, Oriented to  Time, Oriented to Situation Alcohol / Substance Use: Not Applicable Psych Involvement: No (comment)  Admission diagnosis:  Shortness of breath [R06.02] Pleural effusion [J90] Leg swelling [M79.89] Patient Active Problem List   Diagnosis Date Noted  . HFrEF (heart failure with reduced ejection fraction) ([place]) 01/06/2020  . Tobacco use disorder 01/06/2020  . Obesity 01/06/2020  . Bilateral lower extremity edema 01/04/2020  . Shortness of breath 01/04/2020  . MDD (major depressive disorder), recurrent severe, without psychosis ([place]) 10/09/2016  . Diabetes type 2, controlled ([place]) 01/01/2015  . Erectile dysfunction 01/01/2015  . Healthcare maintenance 01/01/2015  . Bipolar 1 disorder, depressed ([place]) 07/23/2013  . Bipolar disorder ([place]) 04/03/2013  . MDD (major depressive disorder), recurrent,  severe, with psychosis ([place]) 11/04/2012  . Suicidal ideation 11/04/2012  . Post traumatic stress disorder (PTSD) 11/04/2012  . CARCINOMA, RENAL CELL 06/05/2008  . HLD (hyperlipidemia) 06/05/2008  . Essential hypertension 06/05/2008  . CHEST PAIN 06/05/2008   PCP:   Patient, No Pcp Per Pharmacy:   [facility], [place] [address] Phone: [phone] Fax: [place], [place] [address] Phone: [phone] Fax: [phone]     Social Determinants of Health (SDOH) Interventions    Readmission Risk Interventions No flowsheet data found.

## 2020-01-08 NOTE — Plan of Care (Signed)
  Problem: Education: Goal: Knowledge of General Education information will improve Description: Including pain rating scale, medication(s)/side effects and non-pharmacologic comfort measures Outcome: Not Progressing   Problem: Health Behavior/Discharge Planning: Goal: Ability to manage health-related needs will improve Outcome: Not Progressing   Problem: Clinical Measurements: Goal: Ability to maintain clinical measurements within normal limits will improve Outcome: Not Progressing Goal: Will remain free from infection Outcome: Not Progressing Goal: Diagnostic test results will improve Outcome: Not Progressing Goal: Respiratory complications will improve Outcome: Not Progressing Goal: Cardiovascular complication will be avoided Outcome: Not Progressing   Problem: Pain Managment: Goal: General experience of comfort will improve Outcome: Not Progressing   Problem: Safety: Goal: Ability to remain free from injury will improve Outcome: Not Progressing   Problem: Skin Integrity: Goal: Risk for impaired skin integrity will decrease Outcome: Not Progressing   Problem: Education: Goal: Ability to describe self-care measures that may prevent or decrease complications (Diabetes Survival Skills Education) will improve Outcome: Not Progressing Goal: Individualized Educational Video(s) Outcome: Not Progressing   Problem: Coping: Goal: Ability to adjust to condition or change in health will improve Outcome: Not Progressing   Problem: Fluid Volume: Goal: Ability to maintain a balanced intake and output will improve Outcome: Not Progressing   Problem: Health Behavior/Discharge Planning: Goal: Ability to identify and utilize available resources and services will improve Outcome: Not Progressing Goal: Ability to manage health-related needs will improve Outcome: Not Progressing   Problem: Metabolic: Goal: Ability to maintain appropriate glucose levels will improve Outcome: Not  Progressing   Problem: Nutritional: Goal: Maintenance of adequate nutrition will improve Outcome: Not Progressing Goal: Progress toward achieving an optimal weight will improve Outcome: Not Progressing   Problem: Skin Integrity: Goal: Risk for impaired skin integrity will decrease Outcome: Not Progressing   Problem: Tissue Perfusion: Goal: Adequacy of tissue perfusion will improve Outcome: Not Progressing

## 2020-01-08 NOTE — Progress Notes (Addendum)
Subjective:   Hospital day: 3  Overnight event: No acute event  Notes having some shortness of breath with ambulating to the bathroom. Has about 10 stairs to get into the house.  Plan to obtain PT/OT.  Patient agrees and understands the plan.  Objective:  Vital signs in last 24 hours: Vitals:   01/07/20 2115 01/07/20 2216 01/08/20 0125 01/08/20 0505  BP: (!) 108/56   109/67  Pulse: (!) 103   70  Resp: 20   20  Temp: 98.1 F (36.7 C)   97.6 F (36.4 C)  TempSrc: Oral   Oral  SpO2: 99%   100%  Weight:  90.6 kg 90.6 kg   Height:       CBC Latest Ref Rng & Units 01/07/2020 01/06/2020 01/05/2020  WBC 4.0 - 10.5 K/uL 4.1 4.3 5.6  Hemoglobin 13.0 - 17.0 g/dL 12.7(L) 12.7(L) 13.8  Hematocrit 39 - 52 % 40.9 40.2 43.9  Platelets 150 - 400 K/uL 176 181 214   CMP Latest Ref Rng & Units 01/08/2020 01/07/2020 01/06/2020  Glucose 70 - 99 mg/dL 71 202(H) 149(H)  BUN 8 - 23 mg/dL 16 17 16   Creatinine 0.61 - 1.24 mg/dL 1.37(H) 1.55(H) 1.45(H)  Sodium 135 - 145 mmol/L 138 139 140  Potassium 3.5 - 5.1 mmol/L 3.9 3.5 3.1(L)  Chloride 98 - 111 mmol/L 101 101 103  CO2 22 - 32 mmol/L 25 28 26   Calcium 8.9 - 10.3 mg/dL 9.0 8.9 9.4  Total Protein 6.5 - 8.1 g/dL - - 6.0(L)  Total Bilirubin 0.3 - 1.2 mg/dL - - 1.5(H)  Alkaline Phos 38 - 126 U/L - - 79  AST 15 - 41 U/L - - 23  ALT 0 - 44 U/L - - 22     Physical Exam  Physical Exam Constitutional:      General: He is not in acute distress. HENT:     Head: Normocephalic.  Eyes:     General:        Right eye: No discharge.        Left eye: No discharge.  Cardiovascular:     Rate and Rhythm: Normal rate and regular rhythm.     Comments: +JVD Pulmonary:     Effort: No respiratory distress.     Comments: Mild crackles in the right lower lung base Abdominal:     General: Bowel sounds are normal.     Palpations: Abdomen is soft.  Musculoskeletal:     Right lower leg: Edema (+1) present.     Left lower leg: Edema (+1) present.   Skin:    General: Skin is warm.     Coloration: Skin is not jaundiced.  Neurological:     Mental Status: He is alert.  Psychiatric:        Mood and Affect: Mood normal.     Assessment/Plan:  Kevin Hubbardis a 65 year old male with past medical history ofhypertension, type 2 diabetes on insulin, major depressive disorder, bipolar disorder, PTSD, renal cell carcinomas/pnephrectomy, chronic knee pain whoadmittedhospital for bilateral lower extremity swelling and shortness of breath,found to have a large right pleural effusion due to acute onset of HFrEF.   Principal Problem:   HFrEF (heart failure with reduced ejection fraction) (HCC) Active Problems:   HLD (hyperlipidemia)   Essential hypertension   Diabetes type 2, controlled (HCC)   Bilateral lower extremity edema   Shortness of breath   Tobacco use disorder   Obesity  Acute decompensated heart failure with reduced EF(25-30%) (  newly diagnosed) Transudative right pleural effusion s/p thoracentesis (11/20). 700cc removed.Transudate nature confirmed with labs. Remains on RA. Chronic hypertension and hyperlipidemia -continue entresto (started this admission), coreg, and lipitor. -No I/O recorded.  Continue strict I's/O with daily weights -No MRI given AKI Per cardiology -Patient still short of breath with exertion. CXR this morning shows mild bibasilar atelectasis with possible tiny right pleural effusion. Will continue diuresis with Lasix 20 mg p.o. daily per cardiology.  -Patient is orthostatic with PT. Recommended SNF. TOC for SNF placement -Outpatient ischemic work-up with cardiology   UncontrolledType 2 diabetes. Continues to have fasting hypoglycemic episodes.  -Reduce Lantus to 20 units daily -Sliding scale insulin -Monitor CBG -Consider SGLT2 after discharge   Major depressive disorder Bipolar disorder PTSD Continue abilify, duloxetine, lamictal, trazodone   Smoking Patient smoked 2 cigarettes  a day for more than 10 years -Nicotine gum 2 mg -encourage cessation   Diet: Heart diet IVF: N/A VTE: Lovenox CODE: Full  Prior to Admission Living Arrangement: Home Anticipated Discharge Location:  SNF Barriers to Discharge:  SNF placement and medical management  Dispo:11/23 [name], DO 01/08/2020, 5:34 AM Pager: [phone] After 5pm on weekdays and 1pm on weekends: On Call pager [phone]

## 2020-01-08 NOTE — Progress Notes (Signed)
Progress Note  Patient Name: [name] Date of Encounter: 01/08/2020  Primary Cardiologist: No primary care provider on file.   Subjective   64 yo M hx DM with HTN, HLD, Tobacco Abuse, Bipolar, prior renal cell cancer s/p nephrecxtomy.  Presentes with DOE and LE Edema, Found to have new HFrEF 25-50% with moderate TR and Moderate MR, TR.   Through course received IV Lasix, with transition to [employer] and Coreg.  Discussion with patient and shared decision making 11/20:  Deferring ischemic eval until outpatient after GDMT.  11/21 IR Thoracentesis 700 removed.  Creatinine has improved with decrease lasix dose. 11?23/21  Patient notes that he felt short of breath walking today.  With PT, has orthostatic hypotension this morning.  No PND orthopnea.  Denies weakness on encounter, but nursing noted that he felt weak with PT.  Inpatient Medications    Scheduled Meds: . ARIPiprazole  10 mg Oral Daily  . atorvastatin  40 mg Oral Daily  . carvedilol  3.125 mg Oral BID WC  . docusate sodium  100 mg Oral BID  . DULoxetine  20 mg Oral Daily  . enoxaparin (LOVENOX) injection  40 mg Subcutaneous Q24H  . furosemide  20 mg Oral Daily  . insulin aspart  0-15 Units Subcutaneous TID WC  . insulin glargine  25 Units Subcutaneous QHS  . lamoTRIgine  200 mg Oral BID  . sacubitril-valsartan  1 tablet Oral BID  . senna  1 tablet Oral BID   Continuous Infusions:  PRN Meds: acetaminophen **OR** acetaminophen, albuterol, gabapentin, nicotine polacrilex, traZODone   Vital Signs    Vitals:   01/07/20 2216 01/08/20 0125 01/08/20 0505 01/08/20 0700  BP:   109/67 111/78  Pulse:   70 100  Resp:   20 14  Temp:   97.6 F (36.4 C) 98.5 F (36.9 C)  TempSrc:   Oral Oral  SpO2:   100% 100%  Weight: 90.6 kg 90.6 kg    Height:        Intake/Output Summary (Last 24 hours) at 01/08/2020 0935 Last data filed at 01/08/2020 0407 Gross per 24 hour  Intake --  Output 2 ml  Net -2 ml   Filed  Weights   01/04/20 0854 01/07/20 2216 01/08/20 0125  Weight: 96.2 kg 90.6 kg 90.6 kg    Telemetry    SR to Sinsu Tach with isolated PACs and PVCs; pause recording is artifactual - Personally Reviewed  ECG     No new- Personally Reviewed  Physical Exam   GEN: No acute distress.   Neck: JVD to mid neck but with prominent V wave Cardiac: RRR, III/VI holosystolic murmur, rubs, or gallops.  Respiratory: Clear to auscultation bilaterally. GI: Soft, nontender, non-distended, slight dullness to percussion MS: 1 pitting edema in feet bilaterally; No deformity. Neuro:  Nonfocal  Psych: Normal affect   Labs    Chemistry Recent Labs  Lab 01/06/20 0628 01/07/20 0838 01/08/20 0343  NA 140 139 138  K 3.1* 3.5 3.9  CL 103 101 101  CO2 26 28 25   GLUCOSE 149* 202* 71  BUN 16 17 16   CREATININE 1.45* 1.55* 1.37*  CALCIUM 9.4 8.9 9.0  PROT 6.0*  --   --   ALBUMIN 2.9*  --   --   AST 23  --   --   ALT 22  --   --   ALKPHOS 79  --   --   BILITOT 1.5*  --   --  GFRNONAA 54* 50* 58*  ANIONGAP 11 10 12      Hematology Recent Labs  Lab 01/05/20 0428 01/06/20 0628 01/07/20 0838  WBC 5.6 4.3 4.1  RBC 5.40 4.96 4.98  HGB 13.8 12.7* 12.7*  HCT 43.9 40.2 40.9  MCV 81.3 81.0 82.1  MCH 25.6* 25.6* 25.5*  MCHC 31.4 31.6 31.1  RDW 14.9 14.8 14.8  PLT 214 181 176   Cardiac EnzymesNo results for input(s): TROPONINI in the last 168 hours. No results for input(s): TROPIPOC in the last 168 hours.   BNP Recent Labs  Lab 01/04/20 0850  BNP 560.0*     DDimer  Recent Labs  Lab 01/04/20 0850  DDIMER 0.87*     Radiology    No results found.  Cardiac Studies   Echo 01/05/20 IMPRESSIONS   1. Diffuse hypokinesis worse in the inferior base. Left ventricular  ejection fraction, by estimation, is 25 to 30%. The left ventricle has  severely decreased function. The left ventricle demonstrates global  hypokinesis. The left ventricular internal  cavity size was mildly dilated.  Left ventricular diastolic parameters were  normal.  2. Right ventricular systolic function is normal. The right ventricular  size is normal. There is mildly elevated pulmonary artery systolic  pressure.  3. Left atrial size was moderately dilated.  4. Right atrial size was mildly dilated.  5. The mitral valve is abnormal. Moderate mitral valve regurgitation. No  evidence of mitral stenosis.  6. Tricuspid valve regurgitation is moderate.  7. The aortic valve is tricuspid. Aortic valve regurgitation is not  visualized. Mild aortic valve sclerosis is present, with no evidence of  aortic valve stenosis.  8. The inferior vena cava is dilated in size with <50% respiratory  variability, suggesting right atrial pressure of 15 mmHg.   Patient Profile     64 y.o. male DM with HTN, HLD Tobacco Abuse Bipoloar and former renal cell s/p nephrectomy who presents with new HF  Assessment & Plan    Acute Decompensated Heart Failure with Reduced Ejection Fraction  Moderate MR Moderate TR DM with HTN Tobacco Abuse Hypokalemia AKI- resolving Orthostatic hypotension  - NYHA class II, Stage C, euvolemic, etiology unclear - Unclear outputs and weights  - would do orthostatic BP in AM 01/09/20; ideally would increase GDMT - Lasix 20 mg PO largely balancing AKI, intravascular volume, and orthostasis - Strict I/Os, daily weights, and fluid restriction of < 2 L   - Replace electrolytes PRN and keep K>4 and Mg>2. - Daily BMP, Mg.  - continue Coreg 3.125 (with resolution of orthostatis, hoping to increase this medication)  - continue Entresto 24/26 - given AKI; will hold off MRA initiation - patient discussed with primary cardiologist outpatient ischemic work up/ reasonable for outpatient re-assessment - Consider outpatient SGLT2i  - would benefit from both [facility] and [facility] follow up (as with his service connection they should be able to pay for his [name])  Discussed with nursing  team; tentatively recommended for SNF  For questions or updates, please contact [place] Please consult [email or web address] for contact info under Cardiology/STEMI.      Signed, Werner Lean, MD  01/08/2020, 9:35 AM

## 2020-01-08 NOTE — Progress Notes (Signed)
CBG:59    This morning.  Pt. Was given 2 orange juices will recheck.

## 2020-01-08 NOTE — Evaluation (Signed)
Physical Therapy Evaluation Patient Details Name: [name] MRN: [id number] DOB: [birth year] Today's Date: 01/08/2020   History of Present Illness  64yo male c/o DOE and BLE edema, CXR with volume overload and R pleural effusion. Significant systolic impairment found on ECHO. Negative for PE. PMH anxiety, CA, DM, h/o suicide attempt, HTN, HLD, osteoporosis  Clinical Impression   Patient received in bed, very pleasant and cooperative with therapy but reporting he still does not quite feel himself. Able to mobilize on a mod(I) to min guard level with SPC but grossly unsteady and gait distance was limited by increasing dizziness in standing. HR and O2 Ok on room air, but he was grossly orthostatic and quite symptomatic:  BP sitting 97/62  BP standing 82/54  BP standing x 3 minutes 84/56 BP with return to sitting x3 minutes 92/67  RN aware of orthostatic findings and PT recommendation. He was left up in recliner with all needs met, RN aware of patient status. Will benefit from SNF to improve strength/reduce fall risk prior to return home.     Follow Up Recommendations SNF    Equipment Recommendations  Rolling walker with 5" wheels    Recommendations for Other Services       Precautions / Restrictions Precautions Precautions: Fall;Other (comment) Precaution Comments: orthostatic Restrictions Weight Bearing Restrictions: No      Mobility  Bed Mobility Overal bed mobility: Modified Independent             General bed mobility comments: HOB elevated, + rail    Transfers Overall transfer level: Needs assistance Equipment used: Straight cane Transfers: Sit to/from Stand Sit to Stand: Min guard         General transfer comment: min guard, increased time and effort and use of momentum/rocking  Ambulation/Gait Ambulation/Gait assistance: Min guard Gait Distance (Feet): 100 Feet Assistive device: Straight cane Gait Pattern/deviations: Step-through  pattern;Decreased step length - right;Decreased step length - left;Decreased stride length;Drifts right/left;Trunk flexed;Wide base of support Gait velocity: decreased   General Gait Details: slow and somewhat unsteady with SPC, needed wide BOS to improve balance and had increasing dizziness with gait  Stairs            Wheelchair Mobility    Modified Rankin (Stroke Patients Only)       Balance Overall balance assessment: Needs assistance Sitting-balance support: Feet supported;No upper extremity supported Sitting balance-[name]: Good     Standing balance support: Single extremity supported;During functional activity Standing balance-[name]: Fair Standing balance comment: min guard for balance, mildly unsteady                             Pertinent Vitals/Pain Pain Assessment: No/denies pain    Home Living Family/patient expects to be discharged to:: Private residence Living Arrangements: Alone Available Help at Discharge: Friend(s);Available PRN/intermittently Type of Home: Apartment (first floor) Home Access: Level entry     Home Layout: One level Home Equipment: Cane - single point      Prior Function Level of Independence: Independent with assistive device(s)               Hand Dominance        Extremity/Trunk Assessment   Upper Extremity Assessment Upper Extremity Assessment: Generalized weakness    Lower Extremity Assessment Lower Extremity Assessment: Generalized weakness    Cervical / Trunk Assessment Cervical / Trunk Assessment: Normal  Communication   Communication: No difficulties  Cognition Arousal/Alertness: Awake/alert Behavior  During Therapy: WFL for tasks assessed/performed Overall Cognitive Status: Within Functional Limits for tasks assessed                                 General Comments: pleasant and cooperative      General Comments General comments (skin integrity, edema, etc.):  orthostatic- see vitals section for details    Exercises     Assessment/Plan    PT Assessment Patient needs continued PT services  PT Problem List Decreased strength;Decreased knowledge of use of DME;Decreased activity tolerance;Decreased safety awareness;Decreased balance;Decreased mobility;Decreased coordination;Cardiopulmonary status limiting activity       PT Treatment Interventions DME instruction;Balance training;Gait training;Functional mobility training;Patient/family education;Therapeutic activities;Therapeutic exercise    PT Goals (Current goals can be found in the Care Plan section)  Acute Rehab PT Goals Patient Stated Goal: go to rehab PT Goal Formulation: With patient Time For Goal Achievement: 01/22/20 Potential to Achieve Goals: Fair    Frequency Min 3X/week   Barriers to discharge Decreased caregiver support lives alone    Co-evaluation               AM-PAC PT "6 Clicks" Mobility  Outcome Measure Help needed turning from your back to your side while in a flat bed without using bedrails?: None Help needed moving from lying on your back to sitting on the side of a flat bed without using bedrails?: A Little Help needed moving to and from a bed to a chair (including a wheelchair)?: A Little Help needed standing up from a chair using your arms (e.g., wheelchair or bedside chair)?: A Little Help needed to walk in hospital room?: A Little Help needed climbing 3-5 steps with a railing? : A Lot 6 Click Score: 18    End of Session Equipment Utilized During Treatment: Gait belt Activity Tolerance: Patient tolerated treatment well Patient left: in chair;with call bell/phone within reach Nurse Communication: Mobility status;Other (comment) (BP during session/orthostatics) PT Visit Diagnosis: Unsteadiness on feet (R26.81);Muscle weakness (generalized) (M62.81);Difficulty in walking, not elsewhere classified (R26.2)    Time: 8295-6213 PT Time Calculation (min)  (ACUTE ONLY): 24 min   Charges:   PT Evaluation $PT Eval Moderate Complexity: 1 Mod PT Treatments $Gait Training: 8-22 mins        Windell Norfolk, DPT, PN1   Supplemental Physical Therapist [place]    Pager [phone] Acute Rehab Office [phone]

## 2020-01-09 DIAGNOSIS — I340 Nonrheumatic mitral (valve) insufficiency: Secondary | ICD-10-CM | POA: Diagnosis not present

## 2020-01-09 DIAGNOSIS — I951 Orthostatic hypotension: Secondary | ICD-10-CM

## 2020-01-09 DIAGNOSIS — N179 Acute kidney failure, unspecified: Secondary | ICD-10-CM | POA: Diagnosis not present

## 2020-01-09 DIAGNOSIS — I5020 Unspecified systolic (congestive) heart failure: Secondary | ICD-10-CM | POA: Diagnosis not present

## 2020-01-09 LAB — BASIC METABOLIC PANEL
Anion gap: 8 (ref 5–15)
BUN: 14 mg/dL (ref 8–23)
CO2: 25 mmol/L (ref 22–32)
Calcium: 8.9 mg/dL (ref 8.9–10.3)
Chloride: 106 mmol/L (ref 98–111)
Creatinine, Ser: 1.29 mg/dL — ABNORMAL HIGH (ref 0.61–1.24)
GFR, Estimated: >60 mL/min (ref >60)
Glucose, Bld: 82 mg/dL (ref 70–99)
Potassium: 3.8 mmol/L (ref 3.5–5.1)
Sodium: 139 mmol/L (ref 135–145)

## 2020-01-09 LAB — GLUCOSE, CAPILLARY
Glucose-Capillary: 92 mg/dL (ref 70–99)
Glucose-Capillary: 185 mg/dL — ABNORMAL HIGH (ref 70–99)
Glucose-Capillary: 211 mg/dL — ABNORMAL HIGH (ref 70–99)
Glucose-Capillary: 85 mg/dL (ref 70–99)

## 2020-01-09 MED ORDER — INSULIN GLARGINE 100 UNIT/ML ~~LOC~~ SOLN
15.0000 [IU] | Freq: Every day | SUBCUTANEOUS | Status: DC
  Filled 2020-01-09: qty 0.15

## 2020-01-09 MED ORDER — FUROSEMIDE 40 MG PO TABS
40.0000 mg | ORAL_TABLET | Freq: Every day | ORAL | Status: DC
  Administered 2020-01-10 – 2020-01-12 (×3): 40 mg via ORAL
  Filled 2020-01-09 (×3): qty 1

## 2020-01-09 MED ORDER — INSULIN GLARGINE 100 UNIT/ML ~~LOC~~ SOLN
20.0000 [IU] | Freq: Every day | SUBCUTANEOUS | Status: DC
  Administered 2020-01-09: 20 [IU] via SUBCUTANEOUS
  Filled 2020-01-09: qty 0.2

## 2020-01-09 MED ORDER — FUROSEMIDE 20 MG PO TABS
20.0000 mg | ORAL_TABLET | Freq: Once | ORAL | Status: AC
  Administered 2020-01-09: 20 mg via ORAL
  Filled 2020-01-09: qty 1

## 2020-01-09 NOTE — Progress Notes (Signed)
Progress Note  Patient Name: [name] Date of Encounter: 01/09/2020  Primary Cardiologist: No primary care provider on file.   Subjective   64 yo M hx DM with HTN, HLD, Tobacco Abuse, Bipolar, prior renal cell cancer s/p nephrecxtomy.  Presentes with DOE and LE Edema, Found to have new HFrEF 25-50% with moderate TR and Moderate MR, TR.   Through course received IV Lasix, with transition to [employer] and Coreg.  Discussion with patient and shared decision making 11/20:  Deferring ischemic eval until outpatient after GDMT.  11/21 IR Thoracentesis 700 removed.  Creatinine has improved with decrease lasix dose. 01/08/20 Orthostatic.  Patient notes that he had an episode where he got up to fast after using the restroom and needed to sit back down.  SOB is table.  Inpatient Medications    Scheduled Meds: . ARIPiprazole  10 mg Oral Daily  . atorvastatin  40 mg Oral Daily  . carvedilol  3.125 mg Oral BID WC  . docusate sodium  100 mg Oral BID  . DULoxetine  20 mg Oral Daily  . enoxaparin (LOVENOX) injection  40 mg Subcutaneous Q24H  . furosemide  20 mg Oral Daily  . insulin aspart  0-15 Units Subcutaneous TID WC  . insulin glargine  15 Units Subcutaneous QHS  . lamoTRIgine  200 mg Oral BID  . sacubitril-valsartan  1 tablet Oral BID  . senna  1 tablet Oral BID   Continuous Infusions:  PRN Meds: acetaminophen **OR** acetaminophen, albuterol, gabapentin, nicotine polacrilex, traZODone   Vital Signs    Vitals:   01/08/20 2125 01/08/20 2142 01/09/20 0118 01/09/20 0413  BP: 112/71   104/68  Pulse: 79   99  Resp: 20   20  Temp: 98.1 F (36.7 C)   97.8 F (36.6 C)  TempSrc: Oral   Oral  SpO2: 95%   99%  Weight:  94.1 kg 94.1 kg   Height:        Intake/Output Summary (Last 24 hours) at 01/09/2020 0816 Last data filed at 01/09/2020 0310 Gross per 24 hour  Intake --  Output 500 ml  Net -500 ml   Filed Weights   01/08/20 0125 01/08/20 2142 01/09/20 0118  Weight: 90.6  kg 94.1 kg 94.1 kg    Telemetry    SR and Sinus tachy with stable PACs and PVCs - Personally Reviewed  ECG     No new- Personally Reviewed  Physical Exam   GEN: No acute distress.   Neck: JVD to mid neck but with prominent V wave Cardiac: RRR, III/VI holosystolic murmur, rubs, or gallops.  Respiratory: Slight crackles in the bases worse from 01/08/20 GI: Soft, nontender, non-distended, slight dullness to percussion MS: 1 pitting edema in feet bilaterally; No deformity. Neuro:  Nonfocal  Psych: Normal affect   Labs    Chemistry Recent Labs  Lab 01/06/20 0628 01/06/20 0628 01/07/20 0838 01/08/20 0343 01/09/20 0239  NA 140   < > 139 138 139  K 3.1*   < > 3.5 3.9 3.8  CL 103   < > 101 101 106  CO2 26   < > 28 25 25   GLUCOSE 149*   < > 202* 71 82  BUN 16   < > 17 16 14   CREATININE 1.45*   < > 1.55* 1.37* 1.29*  CALCIUM 9.4   < > 8.9 9.0 8.9  PROT 6.0*  --   --   --   --   ALBUMIN 2.9*  --   --   --   --  AST 23  --   --   --   --   ALT 22  --   --   --   --   ALKPHOS 79  --   --   --   --   BILITOT 1.5*  --   --   --   --   GFRNONAA 54*   < > 50* 58* >60  ANIONGAP 11   < > 10 12 8    < > = values in this interval not displayed.     Hematology Recent Labs  Lab 01/05/20 0428 01/06/20 0628 01/07/20 0838  WBC 5.6 4.3 4.1  RBC 5.40 4.96 4.98  HGB 13.8 12.7* 12.7*  HCT 43.9 40.2 40.9  MCV 81.3 81.0 82.1  MCH 25.6* 25.6* 25.5*  MCHC 31.4 31.6 31.1  RDW 14.9 14.8 14.8  PLT 214 181 176   Cardiac EnzymesNo results for input(s): TROPONINI in the last 168 hours. No results for input(s): TROPIPOC in the last 168 hours.   BNP Recent Labs  Lab 01/04/20 0850  BNP 560.0*     DDimer  Recent Labs  Lab 01/04/20 0850  DDIMER 0.87*     Radiology    DG CHEST PORT 1 VIEW  Result Date: 01/08/2020 CLINICAL DATA:  Shortness of breath. EXAM: PORTABLE CHEST 1 VIEW COMPARISON:  01/05/2020. FINDINGS: Cardiomegaly. Mild pulmonary venous congestion. Low lung volumes  with mild bibasilar atelectasis. Mild bibasilar infiltrates/edema cannot be excluded. Tiny right pleural effusion cannot be excluded. No pneumothorax. IMPRESSION: 1. Cardiomegaly with mild pulmonary venous congestion. 2. Low lung volumes with mild bibasilar atelectasis. Mild bibasilar infiltrates/edema cannot be excluded. Tiny right pleural effusion cannot be excluded. Electronically Signed   By: Marcello Moores  Register   On: 01/08/2020 10:48    Cardiac Studies   Echo 01/05/20 IMPRESSIONS   1. Diffuse hypokinesis worse in the inferior base. Left ventricular  ejection fraction, by estimation, is 25 to 30%. The left ventricle has  severely decreased function. The left ventricle demonstrates global  hypokinesis. The left ventricular internal  cavity size was mildly dilated. Left ventricular diastolic parameters were  normal.  2. Right ventricular systolic function is normal. The right ventricular  size is normal. There is mildly elevated pulmonary artery systolic  pressure.  3. Left atrial size was moderately dilated.  4. Right atrial size was mildly dilated.  5. The mitral valve is abnormal. Moderate mitral valve regurgitation. No  evidence of mitral stenosis.  6. Tricuspid valve regurgitation is moderate.  7. The aortic valve is tricuspid. Aortic valve regurgitation is not  visualized. Mild aortic valve sclerosis is present, with no evidence of  aortic valve stenosis.  8. The inferior vena cava is dilated in size with <50% respiratory  variability, suggesting right atrial pressure of 15 mmHg.   Patient Profile     64 y.o. male DM with HTN, HLD Tobacco Abuse Bipoloar and former renal cell s/p nephrectomy who presents with new HF  Assessment & Plan    Acute Decompensated Heart Failure with Reduced Ejection Fraction  Moderate MR Moderate TR DM with HTN Tobacco Abuse Hypokalemia AKI- resolving Orthostatic hypotension  - NYHA class II, Stage C, near-euvolemic, etiology  unclear  - would do orthostatic BP in AM 01/09/20; ideally would increase GDMT - Will trial Lasix 40 mg PO largely balancing AKI, intravascular volume, and orthostasis (volume status appears worse that prior without objective data)  - Strict I/Os, daily weights, and fluid restriction of < 2 L  -  Replace electrolytes PRN and keep K>4 and Mg>2. - Daily BMP, Mg.  - continue Coreg 3.125 -may have to DC if persistent orthostasis - continue Entresto 24/26   - given AKI; will hold off MRA initiation - patient discussed with primary cardiologist outpatient ischemic work up deferral- this was reasonable and will plan for outpatient re-assessment unless new issues occur  - Consider outpatient SGLT2i  - would benefit from both [facility] follow up (as with his service connection they should be able to pay for his [name])  Discussed with nursing team  For questions or updates, please contact [facility] Please consult [email or web address] for contact info under Cardiology/STEMI.      Signed, Werner Lean, MD  01/09/2020, 8:16 AM

## 2020-01-09 NOTE — TOC Progression Note (Signed)
Transition of Care [facility]) - Progression Note    Patient Details  Name: [name] MRN: [id number] Date of Birth: [birth year]  Transition of Care [facility]) CM/SW [place], RN Phone Number: [phone]  01/09/2020, 2:56 PM  Clinical Narrative:    CM received message from OT. Therapy recommendations continue to be SNF. Patient refusing SNF stating that he wants to go home and that he is able to manage at home. List of choices offered for home health which would be the next choice for patient refusing SNF. Home health has been set up with [facility]. Start of services to begin next week due to the holiday. Message has been sent to MD to make aware. CM will continue to follow.    Expected Discharge Plan: Home/Self Care Barriers to Discharge: Continued Medical Work up  Expected Discharge Plan and Services Expected Discharge Plan: Home/Self Care In-house Referral: NA Discharge Planning Services: CM Consult   Living arrangements for the past 2 months: Single Family Home                 DME Arranged: N/A DME Agency: NA       HH Arranged: NA HH Agency: NA         Social Determinants of Health (SDOH) Interventions    Readmission Risk Interventions No flowsheet data found.

## 2020-01-09 NOTE — Progress Notes (Signed)
PT Cancellation Note  Patient Details Name: Gibran Veselka MRN: [id number] DOB: [birth year]   Cancelled Treatment:    Reason Eval/Treat Not Completed: Other (comment).  Pt was eating his meal after OT when PT initially came by, then when PT returned he asked PT to come back on Thursday.  Talked with him about follow up and he asked to wait until Friday.  Will follow up as time and pt allow.   Ramond Dial 01/09/2020, 2:04 PM   Mee Hives, PT MS Acute Rehab Dept. Number: [place] [facility] [place]

## 2020-01-09 NOTE — Plan of Care (Signed)

## 2020-01-09 NOTE — Plan of Care (Signed)

## 2020-01-09 NOTE — Evaluation (Signed)
Occupational Therapy Evaluation Patient Details Name: [name] MRN: [id number] DOB: [birth year] Today's Date: 01/09/2020    History of Present Illness 64yo male c/o DOE and BLE edema, CXR with volume overload and R pleural effusion. Significant systolic impairment found on ECHO. Negative for PE. PMH anxiety, CA, DM, h/o suicide attempt, HTN, HLD, osteoporosis   Clinical Impression   This 64 yo male admitted with above presents to acute OT with PLOF of being independent to Mod I with all basic ADLs at [facility] level, IADLs at [facility] level, and getting around by bus. He currently is setup/S-min guard with basic ADLs and Mod A with mobility due to LOB with ambulation. I cannot safely recommend that he can go home alone and thus am recommending SNF.   BP sitting 115/75  HR95 BP standing 1 min  98/66 HR 102 BP standing 3 min  99/56  HR 103 BP sitting on toilet with pt reporting dizziness 111/86  HR 95 BP sitting after almost falling x2 from bathroom to chair that had to be pulled up behind him with RW 98/75  HR 95 BP sitting on bed after going from chair to bed (5 feet away) 94/72  HR 95 BP sitting on EOB at end of session 107/74 HR 97    Follow Up Recommendations  SNF;Supervision/Assistance - 24 hour    Equipment Recommendations  3 in 1 bedside commode;Tub/shower seat       Precautions / Restrictions Precautions Precautions: Fall Precaution Comments: orthostatic Restrictions Weight Bearing Restrictions: No      Mobility Bed Mobility               General bed mobility comments: Pt sitting EOB upon my arrival    Transfers Overall transfer level: Needs assistance Equipment used: Rolling walker (2 wheeled);Straight cane Transfers: Sit to/from Stand Sit to Stand: Min guard         General transfer comment: min guard, increased time and effort and use of momentum/rocking    Balance Overall balance assessment: Needs assistance Sitting-balance support: No upper  extremity supported;Feet supported Sitting balance-[name]: Good     Standing balance support: Single extremity supported;During functional activity Standing balance-[name]: Poor Standing balance comment: min guard for balance, mildly unsteady                           ADL either performed or assessed with clinical judgement   ADL Overall ADL's : Needs assistance/impaired Eating/Feeding: Independent;Sitting   Grooming: Set up;Sitting   Upper Body Bathing: Set up;Sitting   Lower Body Bathing: Min guard;Sit to/from stand   Upper Body Dressing : Set up;Sitting   Lower Body Dressing: Min guard;Sit to/from stand   Toilet Transfer: Moderate assistance;RW;BSC Toilet Transfer Details (indicate cue type and reason): over toilet, LOB x2 that he needed A to correct; max VCs with RW to stay in it (he normally uses a SPC) Toileting- Water quality scientist and Hygiene: Min guard;Sit to/from stand               Vision Patient Visual Report: No change from baseline              Pertinent Vitals/Pain Pain Assessment: No/denies pain     Hand Dominance  right   Extremity/Trunk Assessment Upper Extremity Assessment Upper Extremity Assessment: Overall WFL for tasks assessed           Communication Communication Communication: No difficulties   Cognition Arousal/Alertness: Awake/alert Behavior During Therapy:  WFL for tasks assessed/performed Overall Cognitive Status: Within Functional Limits for tasks assessed                                                Home Living Family/patient expects to be discharged to:: Skilled nursing facility Living Arrangements: Alone Available Help at Discharge: Friend(s);Available PRN/intermittently Type of Home: Apartment             Bathroom Shower/Tub: Teacher, early years/pre: Standard     Home Equipment: Cane - single point          Prior Functioning/Environment Level of  Independence: Independent with assistive device(s)        Comments: Takes bus, does not drive        OT Problem List: Impaired balance (sitting and/or standing)      OT Treatment/Interventions: Self-care/ADL training;DME and/or AE instruction;Patient/family education;Balance training    OT Goals(Current goals can be found in the care plan section) Acute Rehab OT Goals Patient Stated Goal: to go home OT Goal Formulation: With patient Time For Goal Achievement: 01/23/20 Potential to Achieve Goals: Good  OT Frequency: Min 2X/week   Barriers to D/C: Decreased caregiver support             AM-PAC OT "6 Clicks" Daily Activity     Outcome Measure Help from another person eating meals?: None Help from another person taking care of personal grooming?: A Little Help from another person toileting, which includes using toliet, bedpan, or urinal?: A Lot Help from another person bathing (including washing, rinsing, drying)?: A Little Help from another person to put on and taking off regular upper body clothing?: A Little Help from another person to put on and taking off regular lower body clothing?: A Little 6 Click Score: 18   End of Session Equipment Utilized During Treatment: Gait belt;Rolling walker (SPC) Nurse Communication: Mobility status (BP numbers, bed alarm on (pt should not be walking to bathroom by himself))  Activity Tolerance:  (limited by dizziness) Patient left: with bed alarm set (sitting EOB)  OT Visit Diagnosis: Unsteadiness on feet (R26.81);Other abnormalities of gait and mobility (R26.89);Muscle weakness (generalized) (M62.81);Dizziness and giddiness (R42)                Time: 7948-0165 OT Time Calculation (min): 39 min Charges:  OT General Charges $OT Visit: 1 Visit OT Evaluation $OT Eval Moderate Complexity: 1 Mod OT Treatments $Self Care/Home Management : 23-37 mins  [place], OTR/L Acute [employer] Pager [phone] Office  [phone]     Almon Register 01/09/2020, 12:18 PM

## 2020-01-09 NOTE — Progress Notes (Signed)
Subjective:   Hospital day: 4  Overnight event: No acute event  Patient seen bedside.  He states that he is no feeling dizzy when getting up.  He report a episode of fall due to feeling dizzy while using the bathroom around 9 AM this morning.  Denies head injury or loss of consciousness.  Objective:  Vital signs in last 24 hours: Vitals:   01/08/20 2125 01/08/20 2142 01/09/20 0118 01/09/20 0413  BP: 112/71   104/68  Pulse: 79   99  Resp: 20   20  Temp: 98.1 F (36.7 C)   97.8 F (36.6 C)  TempSrc: Oral   Oral  SpO2: 95%   99%  Weight:  94.1 kg 94.1 kg   Height:       CBC Latest Ref Rng & Units 01/07/2020 01/06/2020 01/05/2020  WBC 4.0 - 10.5 K/uL 4.1 4.3 5.6  Hemoglobin 13.0 - 17.0 g/dL 12.7(L) 12.7(L) 13.8  Hematocrit 39 - 52 % 40.9 40.2 43.9  Platelets 150 - 400 K/uL 176 181 214   CMP Latest Ref Rng & Units 01/09/2020 01/08/2020 01/07/2020  Glucose 70 - 99 mg/dL 82 71 202(H)  BUN 8 - 23 mg/dL 14 16 17   Creatinine 0.61 - 1.24 mg/dL 1.29(H) 1.37(H) 1.55(H)  Sodium 135 - 145 mmol/L 139 138 139  Potassium 3.5 - 5.1 mmol/L 3.8 3.9 3.5  Chloride 98 - 111 mmol/L 106 101 101  CO2 22 - 32 mmol/L 25 25 28   Calcium 8.9 - 10.3 mg/dL 8.9 9.0 8.9  Total Protein 6.5 - 8.1 g/dL - - -  Total Bilirubin 0.3 - 1.2 mg/dL - - -  Alkaline Phos 38 - 126 U/L - - -  AST 15 - 41 U/L - - -  ALT 0 - 44 U/L - - -     Physical Exam  Physical Exam Constitutional:      General: He is not in acute distress. HENT:     Head: Normocephalic.  Eyes:     General:        Right eye: No discharge.        Left eye: No discharge.  Cardiovascular:     Rate and Rhythm: Regular rhythm. Tachycardia present.     Comments: +JVD Pulmonary:     Effort: No respiratory distress.     Breath sounds: Normal breath sounds.  Abdominal:     General: Bowel sounds are normal.     Palpations: Abdomen is soft.     Tenderness: There is no abdominal tenderness.  Musculoskeletal:     Right lower leg: Edema  (Trace) present.     Left lower leg: Edema (Trace) present.  Neurological:     Mental Status: He is alert.  Psychiatric:        Mood and Affect: Mood normal.     Assessment/Plan: Regionald Hubbardis a 64 year old male with past medical history ofhypertension, type 2 diabetes on insulin, major depressive disorder, bipolar disorder, PTSD, renal cell carcinomas/pnephrectomy, chronic knee pain whoadmittedhospital for bilateral lower extremity swelling and shortness of breath,found to have a large right pleural effusiondue to acute onset of HFrEF.  Principal Problem:   HFrEF (heart failure with reduced ejection fraction) (HCC) Active Problems:   HLD (hyperlipidemia)   Essential hypertension   Diabetes type 2, controlled (HCC)   Bilateral lower extremity edema   Shortness of breath   Tobacco use disorder   Obesity  Acute decompensated heart failure with reduced EF(25-30%) (newly diagnosed) Transudative right pleural effusion  s/p thoracentesis (11/20).  Chronic hypertension and hyperlipidemia Orthostatic hypotension  -OT section this morning report persistent orthostatic hypotension and dizziness.  Discussed with cardiology regard the concern of his orthostatic and risk of falling. He is already on the lowest dose of Coreg and Entresto. Plan is to stop the [facility] and see how he responds. ? Start a different ACE/ARB. May need to increase diuretic after stopping Entresto.  -continue coreg, and lipitor. -Increase Lasix 40 mg.  Only have 500 cc of output yesterday, unsure if his urine output correctly record. -No MRI given AKI Per cardiology -Outpatient ischemic work-up with cardiology   UncontrolledType 2 diabetes.   Fasting blood sugar 92 this morning.  Still have elevated postprandial CBGs. -Continue Lantus 20 units daily.  Patient may be beneficial from mealtime insulin.  His PCP will be recommended to adjust his insulin regimen. -Sliding scale insulin -Monitor  CBG -Consider SGLT2 after discharge   Major depressive disorder Bipolar disorder PTSD Continue abilify, duloxetine, lamictal, trazodone   Smoking Patient smoked 2 cigarettes a day for more than 10 years -Nicotine gum 2 mg -encourage cessation   Diet: Heart diet IVF: N/A VTE: Lovenox CODE: Full  Prior to Admission Living Arrangement: Home Anticipated Discharge Location: Home Barriers to [place] hypotension  [name], DO 01/09/2020, 6:15 AM Pager: [phone] After 5pm on weekdays and 1pm on weekends: On Call pager [phone]

## 2020-01-10 DIAGNOSIS — I951 Orthostatic hypotension: Secondary | ICD-10-CM

## 2020-01-10 DIAGNOSIS — I5020 Unspecified systolic (congestive) heart failure: Secondary | ICD-10-CM | POA: Diagnosis not present

## 2020-01-10 DIAGNOSIS — I10 Essential (primary) hypertension: Secondary | ICD-10-CM | POA: Diagnosis not present

## 2020-01-10 DIAGNOSIS — F4312 Post-traumatic stress disorder, chronic: Secondary | ICD-10-CM

## 2020-01-10 DIAGNOSIS — E119 Type 2 diabetes mellitus without complications: Secondary | ICD-10-CM | POA: Diagnosis not present

## 2020-01-10 LAB — BASIC METABOLIC PANEL
Anion gap: 10 (ref 5–15)
BUN: 13 mg/dL (ref 8–23)
CO2: 25 mmol/L (ref 22–32)
Calcium: 9.1 mg/dL (ref 8.9–10.3)
Chloride: 106 mmol/L (ref 98–111)
Creatinine, Ser: 1.35 mg/dL — ABNORMAL HIGH (ref 0.61–1.24)
GFR, Estimated: 59 mL/min — ABNORMAL LOW (ref >60)
Glucose, Bld: 61 mg/dL — ABNORMAL LOW (ref 70–99)
Potassium: 4 mmol/L (ref 3.5–5.1)
Sodium: 141 mmol/L (ref 135–145)

## 2020-01-10 LAB — GLUCOSE, CAPILLARY
Glucose-Capillary: 148 mg/dL — ABNORMAL HIGH (ref 70–99)
Glucose-Capillary: 66 mg/dL — ABNORMAL LOW (ref 70–99)
Glucose-Capillary: 132 mg/dL — ABNORMAL HIGH (ref 70–99)
Glucose-Capillary: 161 mg/dL — ABNORMAL HIGH (ref 70–99)
Glucose-Capillary: 195 mg/dL — ABNORMAL HIGH (ref 70–99)

## 2020-01-10 MED ORDER — INSULIN GLARGINE 100 UNIT/ML ~~LOC~~ SOLN
15.0000 [IU] | Freq: Every day | SUBCUTANEOUS | Status: DC
  Administered 2020-01-10 – 2020-01-16 (×7): 15 [IU] via SUBCUTANEOUS
  Filled 2020-01-10 (×9): qty 0.15

## 2020-01-10 NOTE — Progress Notes (Signed)
Progress Note  Patient Name: [name] Date of Encounter: 01/10/2020  Primary Cardiologist: No primary care provider on file.   Subjective   64 yo M hx DM with HTN, HLD, Tobacco Abuse, Bipolar, prior renal cell cancer s/p nephrecxtomy.  Presentes with DOE and LE Edema, Found to have new HFrEF 25-50% with moderate TR and Moderate MR, TR.   Through course received IV Lasix, with transition to [employer] and Coreg.  Discussion with patient and shared decision making 11/20:  Deferring ischemic eval until outpatient after GDMT.  11/21 IR Thoracentesis 700 removed.  Creatinine has improved with decrease lasix dose. 01/08/20 Orthostatic- persistent through course.  Entresto stopped   Patient notes that he still feels dizzy, but no falling out spells.  No SOB today.  Inpatient Medications    Scheduled Meds:  ARIPiprazole  10 mg Oral Daily   atorvastatin  40 mg Oral Daily   carvedilol  3.125 mg Oral BID WC   docusate sodium  100 mg Oral BID   DULoxetine  20 mg Oral Daily   enoxaparin (LOVENOX) injection  40 mg Subcutaneous Q24H   furosemide  40 mg Oral Daily   insulin aspart  0-15 Units Subcutaneous TID WC   insulin glargine  15 Units Subcutaneous QHS   lamoTRIgine  200 mg Oral BID   sacubitril-valsartan  1 tablet Oral BID   senna  1 tablet Oral BID   Continuous Infusions:   PRN Meds: acetaminophen **OR** acetaminophen, albuterol, gabapentin, nicotine polacrilex, traZODone   Vital Signs    Vitals:   01/09/20 0921 01/09/20 1505 01/09/20 2105 01/10/20 0500  BP: 117/74 126/79 117/84 112/79  Pulse: (!) 108 99 97 76  Resp: 19   18  Temp:  98.1 F (36.7 C) 98.3 F (36.8 C) 98 F (36.7 C)  TempSrc:   Oral Oral  SpO2: 97% 92% 100%   Weight:      Height:        Intake/Output Summary (Last 24 hours) at 01/10/2020 0728 Last data filed at 01/09/2020 2030 Gross per 24 hour  Intake 240 ml  Output 1 ml  Net 239 ml   Filed Weights   01/08/20 0125 01/08/20 2142 01/09/20 0118   Weight: 90.6 kg 94.1 kg 94.1 kg    Telemetry     Predominantly SR to sinus tach with occasional PVCs - Personally Reviewed  ECG     No new- Personally Reviewed  Physical Exam   GEN: No acute distress.   Neck: JVD to mid neck but with prominent V wave Cardiac: RRR, III/VI holosystolic murmur, rubs, or gallops.  Respiratory: Slight rhonchi in bases stable from 11/25 GI: Soft, nontender, non-distended, slight dullness to percussion MS: 1 pitting edema in feet bilaterally; No deformity. Neuro:  Nonfocal  Psych: Normal affect   Labs    Chemistry Recent Labs  Lab 01/06/20 0628 01/07/20 0838 01/08/20 0343 01/09/20 0239 01/10/20 0239  NA 140   < > 138 139 141  K 3.1*   < > 3.9 3.8 4.0  CL 103   < > 101 106 106  CO2 26   < > 25 25 25   GLUCOSE 149*   < > 71 82 61*  BUN 16   < > 16 14 13   CREATININE 1.45*   < > 1.37* 1.29* 1.35*  CALCIUM 9.4   < > 9.0 8.9 9.1  PROT 6.0*  --   --   --   --   ALBUMIN 2.9*  --   --   --   --  AST 23  --   --   --   --   ALT 22  --   --   --   --   ALKPHOS 79  --   --   --   --   BILITOT 1.5*  --   --   --   --   GFRNONAA 54*   < > 58* >60 59*  ANIONGAP 11   < > 12 8 10    < > = values in this interval not displayed.     Hematology Recent Labs  Lab 01/05/20 0428 01/06/20 0628 01/07/20 0838  WBC 5.6 4.3 4.1  RBC 5.40 4.96 4.98  HGB 13.8 12.7* 12.7*  HCT 43.9 40.2 40.9  MCV 81.3 81.0 82.1  MCH 25.6* 25.6* 25.5*  MCHC 31.4 31.6 31.1  RDW 14.9 14.8 14.8  PLT 214 181 176   Cardiac EnzymesNo results for input(s): TROPONINI in the last 168 hours. No results for input(s): TROPIPOC in the last 168 hours.   BNP Recent Labs  Lab 01/04/20 0850  BNP 560.0*     DDimer  Recent Labs  Lab 01/04/20 0850  DDIMER 0.87*     Radiology    DG CHEST PORT 1 VIEW  Result Date: 01/08/2020 CLINICAL DATA:  Shortness of breath. EXAM: PORTABLE CHEST 1 VIEW COMPARISON:  01/05/2020. FINDINGS: Cardiomegaly. Mild pulmonary venous congestion.  Low lung volumes with mild bibasilar atelectasis. Mild bibasilar infiltrates/edema cannot be excluded. Tiny right pleural effusion cannot be excluded. No pneumothorax. IMPRESSION: 1. Cardiomegaly with mild pulmonary venous congestion. 2. Low lung volumes with mild bibasilar atelectasis. Mild bibasilar infiltrates/edema cannot be excluded. Tiny right pleural effusion cannot be excluded. Electronically Signed   By: Marcello Moores  Register   On: 01/08/2020 10:48    Cardiac Studies   Echo 01/05/20 IMPRESSIONS    1. Diffuse hypokinesis worse in the inferior base. Left ventricular  ejection fraction, by estimation, is 25 to 30%. The left ventricle has  severely decreased function. The left ventricle demonstrates global  hypokinesis. The left ventricular internal  cavity size was mildly dilated. Left ventricular diastolic parameters were  normal.   2. Right ventricular systolic function is normal. The right ventricular  size is normal. There is mildly elevated pulmonary artery systolic  pressure.   3. Left atrial size was moderately dilated.   4. Right atrial size was mildly dilated.   5. The mitral valve is abnormal. Moderate mitral valve regurgitation. No  evidence of mitral stenosis.   6. Tricuspid valve regurgitation is moderate.   7. The aortic valve is tricuspid. Aortic valve regurgitation is not  visualized. Mild aortic valve sclerosis is present, with no evidence of  aortic valve stenosis.   8. The inferior vena cava is dilated in size with <50% respiratory  variability, suggesting right atrial pressure of 15 mmHg.   Patient Profile     64 y.o. male DM with HTN, HLD Tobacco Abuse Bipoloar and former renal cell s/p nephrectomy who presents with new HF  Assessment & Plan    Acute Decompensated Heart Failure with Reduced Ejection Fraction  Moderate MR Moderate TR DM with HTN Tobacco Abuse Hypokalemia AKI- resolving Orthostatic hypotension  - NYHA class II, Stage C, near-euvolemic,  etiology unclear  - trial off of Entresto 01/10/20 to see of OH improves - on lasix 40 mg PO daily; worry that he may be slight hypervolemic, but OH has been a challenge for medication optimization  - Strict I/Os, daily weights, and fluid  restriction of < 2 L  - Replace electrolytes PRN and keep K>4 and Mg>2. - Daily BMP, Mg. - continue Coreg 3.125  - Only slight increase in creatinine today, this may be patient's new normal, though will continue to monitor; will hold off MRA initiation - patient discussed with primary cardiologist outpatient ischemic work up deferral- this was reasonable and will plan for outpatient re-assessment unless new issues occur -Consider outpatient SGLT2i  - would benefit from both [facility] and [place] follow up   Discussed with patient and primary MD  For questions or updates, please contact [facility] Please consult [email or web address] for contact info under Cardiology/STEMI.      Signed, Werner Lean, MD  01/10/2020, 7:28 AM

## 2020-01-10 NOTE — Progress Notes (Signed)
Subjective:   Hospital day: 5  Overnight event: No acute event  Patient is seen at bedside. He is very pleasant. States that he still feel dizzy when getting up from bed. I&O was not recorded but patient states that he did urinate a good amount yesterday. He is still short of breath with ambulation but improved.    Objective:  Vital signs in last 24 hours: Vitals:   01/09/20 0921 01/09/20 1505 01/09/20 2105 01/10/20 0500  BP: 117/74 126/79 117/84 112/79  Pulse: (!) 108 99 97 76  Resp: 19   18  Temp:  98.1 F (36.7 C) 98.3 F (36.8 C) 98 F (36.7 C)  TempSrc:   Oral Oral  SpO2: 97% 92% 100%   Weight:      Height:       CBC Latest Ref Rng & Units 01/07/2020 01/06/2020 01/05/2020  WBC 4.0 - 10.5 K/uL 4.1 4.3 5.6  Hemoglobin 13.0 - 17.0 g/dL 12.7(L) 12.7(L) 13.8  Hematocrit 39 - 52 % 40.9 40.2 43.9  Platelets 150 - 400 K/uL 176 181 214   CMP Latest Ref Rng & Units 01/10/2020 01/09/2020 01/08/2020  Glucose 70 - 99 mg/dL 61(L) 82 71  BUN 8 - 23 mg/dL 13 14 16   Creatinine 0.61 - 1.24 mg/dL 1.35(H) 1.29(H) 1.37(H)  Sodium 135 - 145 mmol/L 141 139 138  Potassium 3.5 - 5.1 mmol/L 4.0 3.8 3.9  Chloride 98 - 111 mmol/L 106 106 101  CO2 22 - 32 mmol/L 25 25 25   Calcium 8.9 - 10.3 mg/dL 9.1 8.9 9.0  Total Protein 6.5 - 8.1 g/dL - - -  Total Bilirubin 0.3 - 1.2 mg/dL - - -  Alkaline Phos 38 - 126 U/L - - -  AST 15 - 41 U/L - - -  ALT 0 - 44 U/L - - -     Physical Exam  Physical Exam Constitutional:      General: He is not in acute distress.    Appearance: Normal appearance.  HENT:     Head: Normocephalic.  Eyes:     General:        Right eye: No discharge.        Left eye: No discharge.  Cardiovascular:     Rate and Rhythm: Normal rate and regular rhythm.     Heart sounds: Normal heart sounds.     Comments: No obvious JVD Pulmonary:     Effort: No respiratory distress.     Breath sounds: Normal breath sounds.  Abdominal:     Palpations: Abdomen is soft.      Tenderness: There is no abdominal tenderness.  Musculoskeletal:     Right lower leg: Edema (trace) present.     Left lower leg: Edema (trace) present.  Neurological:     Mental Status: He is alert.  Psychiatric:        Mood and Affect: Mood normal.     Assessment/Plan: Kevin Hubbardis a 64 year old male with past medical history ofhypertension, type 2 diabetes on insulin, major depressive disorder, bipolar disorder, PTSD, renal cell carcinomas/pnephrectomy, chronic knee pain whoadmittedhospital for bilateral lower extremity swelling and shortness of breath,found to have a large right pleural effusiondue to acute onset of HFrEF.Now also having orthostatic hypotension.   Principal Problem:   HFrEF (heart failure with reduced ejection fraction) (HCC) Active Problems:   Orthostatic hypotension   HLD (hyperlipidemia)   Essential hypertension   Diabetes type 2, controlled (HCC)   Bilateral lower extremity edema  Shortness of breath   Tobacco use disorder   Obesity  Acute heart failure with reduced EF(25-30%) (newly diagnosed) Transudative right pleural effusion s/p thoracentesis (11/20).  Chronic hypertension and hyperlipidemia Orthostatic hypotension  -Hold Entresto for orthostatic hypotension per Cardiology and see how he responds. May need to start a different ACE/ARB.  -continue coreg, and lipitor. -Continue Lasix 40 mg. Volume status stable. Creatine bumped a little bit 1.29-1.35 -No MRI given AKI Per cardiology -Outpatient ischemic work-up with cardiology   UncontrolledType 2 diabetes. Patient continued to have hypoglycemic event on 20 units of Lantus.  Will reduce to 15 units daily. -Lantus 15 units daily.  Patient may be beneficial from mealtime insulin.  His PCP will be recommended to adjust his insulin regimen. -Sliding scale insulin -Monitor CBG -Consider SGLT2 after discharge   Major depressive disorder Bipolar disorder PTSD Continue abilify,  duloxetine, lamictal, trazodone   Smoking Patient smoked 2 cigarettes a day for more than 10 years -Nicotine gum 2 mg -encourage cessation   Diet: Heart diet IVF: N/A VTE: Lovenox CODE: Full  Prior to Admission Living Arrangement: Home Anticipated Discharge Location: Home with home health Barriers to [place] hypotension   [name], DO 01/10/2020, 6:21 AM Pager: [phone] After 5pm on weekdays and 1pm on weekends: On Call pager [phone]

## 2020-01-10 NOTE — Plan of Care (Signed)

## 2020-01-11 DIAGNOSIS — I700 Atherosclerosis of aorta: Secondary | ICD-10-CM | POA: Diagnosis present

## 2020-01-11 DIAGNOSIS — Q446 Cystic disease of liver: Secondary | ICD-10-CM

## 2020-01-11 DIAGNOSIS — I951 Orthostatic hypotension: Secondary | ICD-10-CM | POA: Diagnosis not present

## 2020-01-11 DIAGNOSIS — I509 Heart failure, unspecified: Secondary | ICD-10-CM | POA: Diagnosis present

## 2020-01-11 DIAGNOSIS — K7689 Other specified diseases of liver: Secondary | ICD-10-CM | POA: Diagnosis present

## 2020-01-11 DIAGNOSIS — I5020 Unspecified systolic (congestive) heart failure: Secondary | ICD-10-CM | POA: Diagnosis not present

## 2020-01-11 DIAGNOSIS — I2510 Atherosclerotic heart disease of native coronary artery without angina pectoris: Secondary | ICD-10-CM | POA: Diagnosis present

## 2020-01-11 HISTORY — DX: Other specified diseases of liver: K76.89

## 2020-01-11 LAB — BASIC METABOLIC PANEL
Anion gap: 12 (ref 5–15)
BUN: 16 mg/dL (ref 8–23)
CO2: 23 mmol/L (ref 22–32)
Calcium: 9.2 mg/dL (ref 8.9–10.3)
Chloride: 105 mmol/L (ref 98–111)
Creatinine, Ser: 1.3 mg/dL — ABNORMAL HIGH (ref 0.61–1.24)
GFR, Estimated: >60 mL/min (ref >60)
Glucose, Bld: 138 mg/dL — ABNORMAL HIGH (ref 70–99)
Potassium: 4.3 mmol/L (ref 3.5–5.1)
Sodium: 140 mmol/L (ref 135–145)

## 2020-01-11 LAB — GLUCOSE, CAPILLARY
Glucose-Capillary: 116 mg/dL — ABNORMAL HIGH (ref 70–99)
Glucose-Capillary: 172 mg/dL — ABNORMAL HIGH (ref 70–99)
Glucose-Capillary: 289 mg/dL — ABNORMAL HIGH (ref 70–99)
Glucose-Capillary: 268 mg/dL — ABNORMAL HIGH (ref 70–99)

## 2020-01-11 NOTE — Progress Notes (Signed)
Subjective:   Hospital day: 6  Overnight event: No acute event  This morning, patient reports continued dizziness with getting up and using the bathroom. He states that his breathing is "okay" and lower extremity swelling has significantly improved.  He has no further complaints or concerns.  Objective:  Vital signs in last 24 hours: Vitals:   01/10/20 1847 01/10/20 2100 01/10/20 2208 01/11/20 0500  BP:  119/72 121/84 118/85  Pulse:  (!) 102 97 74  Resp: 20 17 20 19   Temp: 98.3 F (36.8 C) 98.7 F (37.1 C) 98.7 F (37.1 C) 98.6 F (37 C)  TempSrc:  Oral Oral Oral  SpO2:   98%   Weight:      Height:       CBC Latest Ref Rng & Units 01/07/2020 01/06/2020 01/05/2020  WBC 4.0 - 10.5 K/uL 4.1 4.3 5.6  Hemoglobin 13.0 - 17.0 g/dL 12.7(L) 12.7(L) 13.8  Hematocrit 39 - 52 % 40.9 40.2 43.9  Platelets 150 - 400 K/uL 176 181 214   CMP Latest Ref Rng & Units 01/11/2020 01/10/2020 01/09/2020  Glucose 70 - 99 mg/dL 138(H) 61(L) 82  BUN 8 - 23 mg/dL 16 13 14   Creatinine 0.61 - 1.24 mg/dL 1.30(H) 1.35(H) 1.29(H)  Sodium 135 - 145 mmol/L 140 141 139  Potassium 3.5 - 5.1 mmol/L 4.3 4.0 3.8  Chloride 98 - 111 mmol/L 105 106 106  CO2 22 - 32 mmol/L 23 25 25   Calcium 8.9 - 10.3 mg/dL 9.2 9.1 8.9  Total Protein 6.5 - 8.1 g/dL - - -  Total Bilirubin 0.3 - 1.2 mg/dL - - -  Alkaline Phos 38 - 126 U/L - - -  AST 15 - 41 U/L - - -  ALT 0 - 44 U/L - - -     Physical Exam  Physical Exam Constitutional:      General: He is not in acute distress. HENT:     Head: Normocephalic.  Eyes:     General:        Right eye: No discharge.        Left eye: No discharge.  Cardiovascular:     Rate and Rhythm: Regular rhythm. Tachycardia present.     Comments: No JVD Pulmonary:     Effort: No respiratory distress.     Comments: Slight diminished breath sounds at the left lung base Abdominal:     General: Bowel sounds are normal.     Palpations: Abdomen is soft.  Musculoskeletal:      Cervical back: Normal range of motion.     Right lower leg: No edema.     Left lower leg: No edema.  Neurological:     Mental Status: He is alert.  Psychiatric:        Mood and Affect: Mood normal.     Assessment/Plan: Kevin Hubbardis a 64 year old male with past medical history ofhypertension, type 2 diabetes on insulin, major depressive disorder, bipolar disorder, PTSD, renal cell carcinomas/pnephrectomy, chronic knee pain whoadmittedhospital for bilateral lower extremity swelling and shortness of breath,found to have a large right pleural effusiondue to acute onset of HFrEF.Now also having orthostatic hypotension.   Principal Problem:   HFrEF (heart failure with reduced ejection fraction) (HCC) Active Problems:   Orthostatic hypotension   HLD (hyperlipidemia)   Essential hypertension   Diabetes type 2, controlled (HCC)   Bilateral lower extremity edema   Shortness of breath   Tobacco use disorder   Obesity   Acute heart failure  with reduced EF(25-30%) (newly diagnosed) Transudative right pleural effusion s/p thoracentesis (11/20).  Chronic hypertension and hyperlipidemia Orthostatic hypotension  -Appreciate cardiology recommendation -[name] was held yesterday.  Patient still report dizziness with changing positions.  Will follow up with PT/OT section today to evaluate for both the severity of his orthostatic hypotension.  May need to further adjust his medications.  Patient is not safe to go home. -continuecoreg, and lipitor. -Continue Lasix 40 mg. Volume status stable.  Appears euvolemic on exam.  Creatine bumped a little bit 1.29-1.35 -No MRI given AKI Per cardiology -Outpatient ischemic work-up with cardiology   UncontrolledType 2 diabetes. CBG at goal -Lantus 15 units daily. -Sliding scale insulin -Monitor CBG -Consider SGLT2 after discharge   Major depressive disorder Bipolar disorder PTSD Continue abilify, duloxetine, lamictal,  trazodone   Smoking Patient smoked 2 cigarettes a day for more than 10 years -Nicotine gum 2 mg -encourage cessation   Diet: Heart diet IVF: N/A VTE: Lovenox CODE: Full  Prior to Admission Living Arrangement: Home Anticipated Discharge Location:Home with home health Barriers to [place] hypotension  [name], DO 01/11/2020, 6:06 AM Pager: [phone] After 5pm on weekdays and 1pm on weekends: On Call pager [phone]

## 2020-01-11 NOTE — Progress Notes (Signed)
Progress Note  Patient Name: [name] Date of Encounter: 01/11/2020  Primary Cardiologist: No primary care provider on file.   Subjective   64 yo M hx DM with HTN, HLD, Tobacco Abuse, Bipolar, prior renal cell cancer s/p nephrecxtomy.  Presentes with DOE and LE Edema, Found to have new HFrEF 25-50% with moderate TR and Moderate MR, TR.   Through course received IV Lasix, with transition to [employer] and Coreg.  Discussion with patient and shared decision making 11/20:  Deferring ischemic eval until outpatient after GDMT.  11/21 IR Thoracentesis 700 removed.  Creatinine has improved with decrease lasix dose. 01/08/20 Orthostatic- persistent through course.  Entresto stopped 01/10/20.  Patient notes that he is still having some near syncope.  Notes it most after using the restroom.  Inpatient Medications    Scheduled Meds:  ARIPiprazole  10 mg Oral Daily   atorvastatin  40 mg Oral Daily   carvedilol  3.125 mg Oral BID WC   docusate sodium  100 mg Oral BID   DULoxetine  20 mg Oral Daily   enoxaparin (LOVENOX) injection  40 mg Subcutaneous Q24H   furosemide  40 mg Oral Daily   insulin aspart  0-15 Units Subcutaneous TID WC   insulin glargine  15 Units Subcutaneous QHS   lamoTRIgine  200 mg Oral BID   senna  1 tablet Oral BID   Continuous Infusions:   PRN Meds: acetaminophen **OR** acetaminophen, albuterol, gabapentin, nicotine polacrilex, traZODone   Vital Signs    Vitals:   01/10/20 2100 01/10/20 2208 01/11/20 0500 01/11/20 0750  BP: 119/72 121/84 118/85   Pulse: (!) 102 97 74   Resp: 17 20 19    Temp: 98.7 F (37.1 C) 98.7 F (37.1 C) 98.6 F (37 C)   TempSrc: Oral Oral Oral   SpO2:  98%    Weight:    84.5 kg  Height:        Intake/Output Summary (Last 24 hours) at 01/11/2020 1045 Last data filed at 01/10/2020 1658 Gross per 24 hour  Intake --  Output 575 ml  Net -575 ml   Filed Weights   01/08/20 2142 01/09/20 0118 01/11/20 0750  Weight: 94.1 kg  94.1 kg 84.5 kg    Telemetry     SR to sinus tachy with occasional PVCs and artifact - Personally Reviewed  ECG     No new- Personally Reviewed  Physical Exam   GEN: No acute distress.   Neck: JVD to mid neck but with prominent V wave Cardiac: RRR, III/VI holosystolic murmur, rubs, or gallops.  Respiratory: Clear to auscultation bilaterally GI: Soft, nontender, non-distended, slight dullness to percussion MS: 1 pitting edema in feet bilaterally; No deformity. Neuro:  Nonfocal  Psych: Normal affect   Labs    Chemistry Recent Labs  Lab 01/06/20 0628 01/07/20 0838 01/09/20 0239 01/10/20 0239 01/11/20 0419  NA 140   < > 139 141 140  K 3.1*   < > 3.8 4.0 4.3  CL 103   < > 106 106 105  CO2 26   < > 25 25 23   GLUCOSE 149*   < > 82 61* 138*  BUN 16   < > 14 13 16   CREATININE 1.45*   < > 1.29* 1.35* 1.30*  CALCIUM 9.4   < > 8.9 9.1 9.2  PROT 6.0*  --   --   --   --   ALBUMIN 2.9*  --   --   --   --  AST 23  --   --   --   --   ALT 22  --   --   --   --   ALKPHOS 79  --   --   --   --   BILITOT 1.5*  --   --   --   --   GFRNONAA 54*   < > >60 59* >60  ANIONGAP 11   < > 8 10 12    < > = values in this interval not displayed.     Hematology Recent Labs  Lab 01/05/20 0428 01/06/20 0628 01/07/20 0838  WBC 5.6 4.3 4.1  RBC 5.40 4.96 4.98  HGB 13.8 12.7* 12.7*  HCT 43.9 40.2 40.9  MCV 81.3 81.0 82.1  MCH 25.6* 25.6* 25.5*  MCHC 31.4 31.6 31.1  RDW 14.9 14.8 14.8  PLT 214 181 176   Cardiac EnzymesNo results for input(s): TROPONINI in the last 168 hours. No results for input(s): TROPIPOC in the last 168 hours.   BNP No results for input(s): BNP, PROBNP in the last 168 hours.   DDimer  No results for input(s): DDIMER in the last 168 hours.   Radiology    No results found.  Cardiac Studies   Echo 01/05/20 IMPRESSIONS    1. Diffuse hypokinesis worse in the inferior base. Left ventricular  ejection fraction, by estimation, is 25 to 30%. The left  ventricle has  severely decreased function. The left ventricle demonstrates global  hypokinesis. The left ventricular internal  cavity size was mildly dilated. Left ventricular diastolic parameters were  normal.   2. Right ventricular systolic function is normal. The right ventricular  size is normal. There is mildly elevated pulmonary artery systolic  pressure.   3. Left atrial size was moderately dilated.   4. Right atrial size was mildly dilated.   5. The mitral valve is abnormal. Moderate mitral valve regurgitation. No  evidence of mitral stenosis.   6. Tricuspid valve regurgitation is moderate.   7. The aortic valve is tricuspid. Aortic valve regurgitation is not  visualized. Mild aortic valve sclerosis is present, with no evidence of  aortic valve stenosis.   8. The inferior vena cava is dilated in size with <50% respiratory  variability, suggesting right atrial pressure of 15 mmHg.   Patient Profile     65 y.o. male DM with HTN, HLD Tobacco Abuse Bipoloar and former renal cell s/p nephrectomy who presents with new HF  Assessment & Plan    Acute Decompensated Heart Failure with Reduced Ejection Fraction  Moderate MR Moderate TR DM with HTN Tobacco Abuse Hypokalemia AKI- resolving Orthostatic hypotension  - NYHA class II, Stage C, near-euvolemic, etiology unclear - off of Entresto 01/10/20 for orthostatic hypotension - on lasix 40 mg PO daily- creatinine stable - Strict I/Os, daily weights, and fluid restriction of < 2 L  - Replace electrolytes PRN and keep K>4 and Mg>2. - Daily BMP, Mg.  - will continue Coreg 3.125 if no further orthostatic hypotension (intolerance to significant GDMT is a poor prognostic factor) - may need losartan 25 mg PO daily   - stable creatinine - patient discussed with primary cardiologist outpatient ischemic work up deferral- this was reasonable and will plan for outpatient re-assessment unless new issues occur -Consider outpatient SGLT2i   - would benefit from both [facility] and [facility] follow up   Discussed with patient and primary MD  For questions or updates, please contact [facility] Please consult [email or web address] for  contact info under Cardiology/STEMI.      Signed, Werner Lean, MD  01/11/2020, 10:45 AM

## 2020-01-12 DIAGNOSIS — I5020 Unspecified systolic (congestive) heart failure: Secondary | ICD-10-CM | POA: Diagnosis not present

## 2020-01-12 LAB — BASIC METABOLIC PANEL
Anion gap: 10 (ref 5–15)
BUN: 19 mg/dL (ref 8–23)
CO2: 23 mmol/L (ref 22–32)
Calcium: 9 mg/dL (ref 8.9–10.3)
Chloride: 104 mmol/L (ref 98–111)
Creatinine, Ser: 1.41 mg/dL — ABNORMAL HIGH (ref 0.61–1.24)
GFR, Estimated: 56 mL/min — ABNORMAL LOW (ref >60)
Glucose, Bld: 225 mg/dL — ABNORMAL HIGH (ref 70–99)
Potassium: 4.1 mmol/L (ref 3.5–5.1)
Sodium: 137 mmol/L (ref 135–145)

## 2020-01-12 LAB — GLUCOSE, CAPILLARY
Glucose-Capillary: 138 mg/dL — ABNORMAL HIGH (ref 70–99)
Glucose-Capillary: 159 mg/dL — ABNORMAL HIGH (ref 70–99)
Glucose-Capillary: 256 mg/dL — ABNORMAL HIGH (ref 70–99)
Glucose-Capillary: 103 mg/dL — ABNORMAL HIGH (ref 70–99)

## 2020-01-12 MED ORDER — FUROSEMIDE 20 MG PO TABS
20.0000 mg | ORAL_TABLET | Freq: Every day | ORAL | Status: DC
  Administered 2020-01-13 – 2020-01-14 (×2): 20 mg via ORAL
  Filled 2020-01-12 (×2): qty 1

## 2020-01-12 MED ORDER — INSULIN ASPART 100 UNIT/ML ~~LOC~~ SOLN
3.0000 [IU] | Freq: Three times a day (TID) | SUBCUTANEOUS | Status: DC
  Administered 2020-01-12 (×2): 3 [IU] via SUBCUTANEOUS

## 2020-01-12 NOTE — Progress Notes (Addendum)
SATURATION QUALIFICATIONS: (This note is used to comply with regulatory documentation for home oxygen)  Patient Saturations on Room Air at Rest = 96%  Patient Saturations on Room Air while Ambulating = 99%  Patient Saturations on 0 Liters of oxygen while Ambulating = 99%  Please briefly explain why patient needs home oxygen: n/a  Erasmo Leventhal , PTA Acute Rehabilitation Services Pager [phone] Office [phone]

## 2020-01-12 NOTE — Progress Notes (Signed)
Progress Note  Patient Name: [name] Date of Encounter: 01/12/2020  Primary Cardiologist:   Jenkins Rouge, MD   Subjective   He says that he is very dizzy when he stands up.  Breathing is not at baseline but he is on room air.   Inpatient Medications    Scheduled Meds: . ARIPiprazole  10 mg Oral Daily  . atorvastatin  40 mg Oral Daily  . carvedilol  3.125 mg Oral BID WC  . docusate sodium  100 mg Oral BID  . DULoxetine  20 mg Oral Daily  . enoxaparin (LOVENOX) injection  40 mg Subcutaneous Q24H  . furosemide  40 mg Oral Daily  . insulin aspart  0-15 Units Subcutaneous TID WC  . insulin aspart  3 Units Subcutaneous TID WC  . insulin glargine  15 Units Subcutaneous QHS  . lamoTRIgine  200 mg Oral BID  . senna  1 tablet Oral BID   Continuous Infusions:  PRN Meds: acetaminophen **OR** acetaminophen, albuterol, gabapentin, nicotine polacrilex, traZODone   Vital Signs    Vitals:   01/11/20 1739 01/11/20 2132 01/12/20 0500 01/12/20 0527  BP: 115/80 110/70  119/85  Pulse: 84 93  81  Resp: 20 18  20   Temp:  98.7 F (37.1 C)  98.9 F (37.2 C)  TempSrc:  Oral  Oral  SpO2:  95%  100%  Weight:   85 kg   Height:        Intake/Output Summary (Last 24 hours) at 01/12/2020 0817 Last data filed at 01/12/2020 0630 Gross per 24 hour  Intake --  Output 550 ml  Net -550 ml   Filed Weights   01/09/20 0118 01/11/20 0750 01/12/20 0500  Weight: 94.1 kg 84.5 kg 85 kg    Telemetry    NSR - Personally Reviewed  ECG    NA - Personally Reviewed  Physical Exam   GEN: No acute distress.   Neck: No  JVD Cardiac: RRR, no murmurs, rubs, or gallops.  Respiratory: Clear  to auscultation bilaterally. GI: Soft, nontender, non-distended  MS: No  edema; No deformity. Neuro:  Nonfocal  Psych: Normal affect   Labs    Chemistry Recent Labs  Lab 01/06/20 0628 01/07/20 0838 01/10/20 0239 01/11/20 0419 01/12/20 0157  NA 140   < > 141 140 137  K 3.1*   < > 4.0  4.3 4.1  CL 103   < > 106 105 104  CO2 26   < > 25 23 23   GLUCOSE 149*   < > 61* 138* 225*  BUN 16   < > 13 16 19   CREATININE 1.45*   < > 1.35* 1.30* 1.41*  CALCIUM 9.4   < > 9.1 9.2 9.0  PROT 6.0*  --   --   --   --   ALBUMIN 2.9*  --   --   --   --   AST 23  --   --   --   --   ALT 22  --   --   --   --   ALKPHOS 79  --   --   --   --   BILITOT 1.5*  --   --   --   --   GFRNONAA 54*   < > 59* >60 56*  ANIONGAP 11   < > 10 12 10    < > = values in this interval not displayed.     Hematology Recent Labs  Lab  01/06/20 0628 01/07/20 0838  WBC 4.3 4.1  RBC 4.96 4.98  HGB 12.7* 12.7*  HCT 40.2 40.9  MCV 81.0 82.1  MCH 25.6* 25.5*  MCHC 31.6 31.1  RDW 14.8 14.8  PLT 181 176    Cardiac EnzymesNo results for input(s): TROPONINI in the last 168 hours. No results for input(s): TROPIPOC in the last 168 hours.   BNPNo results for input(s): BNP, PROBNP in the last 168 hours.   DDimer No results for input(s): DDIMER in the last 168 hours.   Radiology    No results found.  Cardiac Studies   Echo:  Echo 01/05/20 IMPRESSIONS   1. Diffuse hypokinesis worse in the inferior base. Left ventricular  ejection fraction, by estimation, is 25 to 30%. The left ventricle has  severely decreased function. The left ventricle demonstrates global  hypokinesis. The left ventricular internal  cavity size was mildly dilated. Left ventricular diastolic parameters were  normal.  2. Right ventricular systolic function is normal. The right ventricular  size is normal. There is mildly elevated pulmonary artery systolic  pressure.  3. Left atrial size was moderately dilated.  4. Right atrial size was mildly dilated.  5. The mitral valve is abnormal. Moderate mitral valve regurgitation. No  evidence of mitral stenosis.  6. Tricuspid valve regurgitation is moderate.  7. The aortic valve is tricuspid. Aortic valve regurgitation is not  visualized. Mild aortic valve sclerosis is present,  with no evidence of  aortic valve stenosis.  8. The inferior vena cava is dilated in size with <50% respiratory  variability, suggesting right atrial pressure of 15 mmHg.   Patient Profile     64 y.o. male  with HTN, HLD, Tobacco Abuse, Bipolar, prior renal cell cancer s/p nephrecxtomy.  Presentes with DOE and LE Edema, Found to have new HFrEF 25-50% with moderate TR and Moderate MR, TR.   Through course received IV Lasix, with transition to [employer] and Coreg.  Discussion with patient and shared decision making 11/20:  Deferring ischemic eval until outpatient after GDMT.  11/21 IR Thoracentesis 700 removed.  Creatinine has improved with decrease lasix dose. 01/08/20 Orthostatic- persistent through course.  Entresto stopped 01/10/20.  Assessment & Plan    ACUTE SYSTOLIC HF:  Med titration complicated by orthostatic hypotension.  Entresto held.  Continue Coreg.  Plan (as previously outlined) is for out patient ischemia evaluation.  Net negative 3.57 liters.  Intake and output might be incomplete.  Creat is stable.   I will add TED hose and reduce his Lasix to 20 mg daily.    For questions or updates, please contact [facility] Please consult [email or web address] for contact info under Cardiology/STEMI.   Signed, Minus Breeding, MD  01/12/2020, 8:17 AM

## 2020-01-12 NOTE — Progress Notes (Signed)
Orthostatic VS for the past 24 hrs (Last 3 readings):  BP- Lying Pulse- Lying BP- Sitting Pulse- Sitting BP- Standing at 0 minutes Pulse- Standing at 0 minutes BP- Standing at 3 minutes Pulse- Standing at 3 minutes  01/12/20 1350 107/68 96 116/80 96 (!) 131/103 99 119/74 109   Ellanore Vanhook R. , PTA Acute Rehabilitation Services Pager [phone] Office [phone]

## 2020-01-12 NOTE — Progress Notes (Addendum)
Subjective:   Hospital day: 7  Overnight event: No acute event  Patient reports worsening of his dizziness when getting up. Also complains of SOB with exertion that has not improved much  Patient's sister requested update. Patient give permission to call sister. Unsuccessful attempt to reach his sister x 2.  Objective:  Vital signs in last 24 hours: Vitals:   01/11/20 1739 01/11/20 2132 01/12/20 0500 01/12/20 0527  BP: 115/80 110/70  119/85  Pulse: 84 93  81  Resp: 20 18  20   Temp:  98.7 F (37.1 C)  98.9 F (37.2 C)  TempSrc:  Oral  Oral  SpO2:  95%  100%  Weight:   85 kg   Height:       CBC Latest Ref Rng & Units 01/07/2020 01/06/2020 01/05/2020  WBC 4.0 - 10.5 K/uL 4.1 4.3 5.6  Hemoglobin 13.0 - 17.0 g/dL 12.7(L) 12.7(L) 13.8  Hematocrit 39 - 52 % 40.9 40.2 43.9  Platelets 150 - 400 K/uL 176 181 214   CMP Latest Ref Rng & Units 01/12/2020 01/11/2020 01/10/2020  Glucose 70 - 99 mg/dL 225(H) 138(H) 61(L)  BUN 8 - 23 mg/dL 19 16 13   Creatinine 0.61 - 1.24 mg/dL 1.41(H) 1.30(H) 1.35(H)  Sodium 135 - 145 mmol/L 137 140 141  Potassium 3.5 - 5.1 mmol/L 4.1 4.3 4.0  Chloride 98 - 111 mmol/L 104 105 106  CO2 22 - 32 mmol/L 23 23 25   Calcium 8.9 - 10.3 mg/dL 9.0 9.2 9.1  Total Protein 6.5 - 8.1 g/dL - - -  Total Bilirubin 0.3 - 1.2 mg/dL - - -  Alkaline Phos 38 - 126 U/L - - -  AST 15 - 41 U/L - - -  ALT 0 - 44 U/L - - -     Physical Exam  Physical Exam Constitutional:      General: He is not in acute distress. HENT:     Head: Normocephalic.  Eyes:     General:        Right eye: No discharge.        Left eye: No discharge.  Cardiovascular:     Rate and Rhythm: Normal rate and regular rhythm.     Comments: No JVD Pulmonary:     Effort: No respiratory distress.     Comments: Mild expiratory wheezing and rhonchi bilat.  Abdominal:     General: Bowel sounds are normal.     Palpations: Abdomen is soft.  Musculoskeletal:     Right lower leg: No edema.      Left lower leg: No edema.  Neurological:     Mental Status: He is alert.      Assessment/Plan: Regionald Hubbardis a 64 year old male with past medical history ofhypertension, type 2 diabetes on insulin, major depressive disorder, bipolar disorder, PTSD, renal cell carcinomas/pnephrectomy, chronic knee pain whoadmittedhospital for bilateral lower extremity swelling and shortness of breath,found to have a large right pleural effusiondue to acute onset of HFrEF.Now also having orthostatic hypotension.  Principal Problem:   HFrEF (heart failure with reduced ejection fraction) (HCC) Active Problems:   Orthostatic hypotension   HLD (hyperlipidemia)   Essential hypertension   Diabetes type 2, controlled (HCC)   Bilateral lower extremity edema   Shortness of breath   Tobacco use disorder   Obesity   Pleural effusion due to congestive heart failure (HCC)   Aortic atherosclerosis (HCC)   Coronary artery disease   Hepatic cyst   Acute heart failure with reduced EF(25-30%) (  newly diagnosed) Transudative right pleural effusion s/p thoracentesis (11/20).  CAD Aortic atherosclosis Chronic hypertension and hyperlipidemia Orthostatic hypotension  -Appreciate cardiology recommendation -[name] was held however patient still reports worsening dizziness.  Will follow up with PT/OT section today to evaluate for the severity of his orthostatic hypotension. Added TED hose.  -continuecoreg, and lipitor. -Volume status stable.  Appears euvolemic on exam.  Lasix was decreased to 20 mg daily -No MRI given AKI Per cardiology -Outpatient ischemic work-up with cardiology   UncontrolledType 2 diabetes. Fasting CBG at goal but elevated pre-prandial CBG. Will add Novolog 3 units TID -Lantus 15 units daily. -Novolog 3  Units daily -Sliding scale insulin -Monitor CBG -Consider SGLT2 after discharge   Liver cyst -Follow up outpatient   Major depressive disorder Bipolar  disorder PTSD Continue abilify, duloxetine, lamictal, trazodone   Smoking Patient smoked 2 cigarettes a day for more than 10 years -Nicotine gum 2 mg -encourage cessation -Albuterol PRN   Diet: Heart diet IVF: N/A VTE: Lovenox CODE: Full  Prior to Admission Living Arrangement: Home Anticipated Discharge [facility] home health Barriers to [place] hypotension  [name], DO 01/12/2020, 6:13 AM Pager: [phone] After 5pm on weekdays and 1pm on weekends: On Call pager [phone]

## 2020-01-12 NOTE — Progress Notes (Addendum)
Physical Therapy Treatment Patient Details Name: [name] MRN: [id number] DOB: Jun 25, 1955 Today's Date: 01/12/2020    History of Present Illness 64yo male c/o DOE and BLE edema, CXR with volume overload and R pleural effusion. Significant systolic impairment found on ECHO. Negative for PE. PMH anxiety, CA, DM, h/o suicide attempt, HTN, HLD, osteoporosis    PT Comments    Pt supine in bed on arrival.  He required cues for encouragement to participate in PT session.  Pt was agreeable to session.  He denies dizziness from supine>sit> stand but during ambulation in halls he reports dizziness.  Orthostatics assessed this session.  And walking SPO2. Pt continues to benefit from skilled rehab at SNF to maximize functional gains.  He presents weaker to date and required increased assistance.   Orthostatic Vitals Supine - 107/68 Sitting - 116/80 Standing - 131/103 After gt training -  119/74  HR ranged from 96-109 bpm.  SPO2 on RA 96%-100% throughout session.   Follow Up Recommendations  SNF     Equipment Recommendations  Rolling walker with 5" wheels    Recommendations for Other Services       Precautions / Restrictions Precautions Precautions: Fall Restrictions Weight Bearing Restrictions: No    Mobility  Bed Mobility Overal bed mobility: Needs Assistance Bed Mobility: Sidelying to Sit   Sidelying to sit: Min assist       General bed mobility comments: In sidelying on arrival.  He required assistance to move trunk into sitting.  Transfers Overall transfer level: Needs assistance Equipment used: Rolling walker (2 wheeled) Transfers: Sit to/from Stand Sit to Stand: Min assist         General transfer comment: min assistance with cues for hand placement to and from seated surface.  Pt required cues to back entirely to seated surface before sitting.  Ambulation/Gait Ambulation/Gait assistance: Mod assist Gait Distance (Feet): 80 Feet Assistive device:  Rolling walker (2 wheeled) Gait Pattern/deviations: Step-through pattern;Decreased step length - right;Decreased step length - left;Decreased stride length;Drifts right/left;Trunk flexed;Wide base of support Gait velocity: decreased   General Gait Details: Poor balance noted this session and reports mild dizziness during gt training.  Assistance to turn and maintain position in RW.   Stairs             Wheelchair Mobility    Modified Rankin (Stroke Patients Only)       Balance Overall balance assessment: Needs assistance Sitting-balance support: No upper extremity supported;Feet supported Sitting balance-[name]: Good       Standing balance-[name]: Poor Standing balance comment: LOB noted multiple times this session                            Cognition Arousal/Alertness: Awake/alert Behavior During Therapy: WFL for tasks assessed/performed Overall Cognitive Status: Impaired/Different from baseline Area of Impairment: Safety/judgement;Problem solving                         Safety/Judgement: Decreased awareness of safety;Decreased awareness of deficits   Problem Solving: Requires verbal cues        Exercises      General Comments        Pertinent Vitals/Pain Pain Assessment: No/denies pain    Home Living                      Prior Function  PT Goals (current goals can now be found in the care plan section) Acute Rehab PT Goals Patient Stated Goal: to go home PT Goal Formulation: With patient Potential to Achieve Goals: Fair Progress towards PT goals: Progressing toward goals    Frequency    Min 3X/week      PT Plan Current plan remains appropriate    Co-evaluation              AM-PAC PT "6 Clicks" Mobility   Outcome Measure  Help needed turning from your back to your side while in a flat bed without using bedrails?: None Help needed moving from lying on your back to sitting on  the side of a flat bed without using bedrails?: A Little Help needed moving to and from a bed to a chair (including a wheelchair)?: A Little Help needed standing up from a chair using your arms (e.g., wheelchair or bedside chair)?: A Little Help needed to walk in hospital room?: A Little Help needed climbing 3-5 steps with a railing? : A Lot 6 Click Score: 18    End of Session Equipment Utilized During Treatment: Gait belt Activity Tolerance: Patient tolerated treatment well Patient left: in chair;with call bell/phone within reach;with chair alarm set Nurse Communication: Mobility status;Other (comment) PT Visit Diagnosis: Unsteadiness on feet (R26.81);Muscle weakness (generalized) (M62.81);Difficulty in walking, not elsewhere classified (R26.2)     Time: 8657-8469 PT Time Calculation (min) (ACUTE ONLY): 19 min  Charges:  $Gait Training: 8-22 mins                     Erasmo Leventhal , PTA Acute Rehabilitation Services Pager [phone] Office [phone]     Arabelle Bollig Eli Hose 01/12/2020, 2:59 PM

## 2020-01-13 ENCOUNTER — Inpatient Hospital Stay (HOSPITAL_COMMUNITY): Payer: No Typology Code available for payment source

## 2020-01-13 DIAGNOSIS — I700 Atherosclerosis of aorta: Secondary | ICD-10-CM

## 2020-01-13 DIAGNOSIS — R42 Dizziness and giddiness: Secondary | ICD-10-CM

## 2020-01-13 DIAGNOSIS — I5020 Unspecified systolic (congestive) heart failure: Secondary | ICD-10-CM | POA: Diagnosis not present

## 2020-01-13 DIAGNOSIS — I2510 Atherosclerotic heart disease of native coronary artery without angina pectoris: Secondary | ICD-10-CM

## 2020-01-13 DIAGNOSIS — K7689 Other specified diseases of liver: Secondary | ICD-10-CM

## 2020-01-13 LAB — BASIC METABOLIC PANEL
Anion gap: 11 (ref 5–15)
BUN: 16 mg/dL (ref 8–23)
CO2: 23 mmol/L (ref 22–32)
Calcium: 9.4 mg/dL (ref 8.9–10.3)
Chloride: 105 mmol/L (ref 98–111)
Creatinine, Ser: 1.46 mg/dL — ABNORMAL HIGH (ref 0.61–1.24)
GFR, Estimated: 54 mL/min — ABNORMAL LOW (ref >60)
Glucose, Bld: 102 mg/dL — ABNORMAL HIGH (ref 70–99)
Potassium: 4.3 mmol/L (ref 3.5–5.1)
Sodium: 139 mmol/L (ref 135–145)

## 2020-01-13 LAB — GLUCOSE, CAPILLARY
Glucose-Capillary: 99 mg/dL (ref 70–99)
Glucose-Capillary: 393 mg/dL — ABNORMAL HIGH (ref 70–99)
Glucose-Capillary: 53 mg/dL — ABNORMAL LOW (ref 70–99)
Glucose-Capillary: 88 mg/dL (ref 70–99)
Glucose-Capillary: 242 mg/dL — ABNORMAL HIGH (ref 70–99)

## 2020-01-13 MED ORDER — INSULIN ASPART 100 UNIT/ML ~~LOC~~ SOLN
6.0000 [IU] | Freq: Three times a day (TID) | SUBCUTANEOUS | Status: DC
  Administered 2020-01-13 – 2020-01-16 (×7): 6 [IU] via SUBCUTANEOUS

## 2020-01-13 MED ORDER — DEXTROSE 50 % IV SOLN
INTRAVENOUS | Status: AC
  Filled 2020-01-13: qty 50

## 2020-01-13 NOTE — Progress Notes (Addendum)
Pt ambulating unassisted in room and reported hitting his head on his "bedside table, sink, and door and then falling near bathroom". Pt found laying on floor near bathroom by SLP and charge RN notified.  Pt sustained scrapes on his left arm.  RN notified Dr. [name] of fall by RN. Pt's bed alarm malfunctioning and moved to another room with a low bed. RN attempted to reach family by phone.  MD placed orders for chest xray.

## 2020-01-13 NOTE — Progress Notes (Signed)
01/13/2020 Patient blood sugar was 53 a 1637. Patient ate his supper, was give orange and cranberry juice.  Blood sugar was rechecked at 1713 it was 88. Internal medicine was made of patient did not have a IV and was told pre and post blood sugar. [facility] RN.

## 2020-01-13 NOTE — Significant Event (Addendum)
I was notified that patient had an unwitnessed fall this afternoon.  Patient was transferred to a lower bed when I came in for evaluation.  He states that he got up and walked to the bathroom and fell, hit his head against the sink.  When he tried to got up, he fell again and hit his head against the bathroom door.  Patient then tried to get out of the room to get help and fell another time and hit his head against the front door.  He was found by the speech therapist and notified the RN.  Patient states that he has mild pain in the back of his head.  He states that he fell because he was feeling weak.  Denies dizziness, vomiting or new neurological symptoms.  Patient is not on blood thinner.  Physical Exam PERRLA Normal EOM Cranial nerve no deficit Normal strength, sensation and range of motion of bilateral UE Normal sensation and range of motion of bilateral LE, 4-5/5 strength Tenderness to palpation of the left chest wall No depressed skull, hematoma, raccoon eyes, battle sign, CHF rhinorrhea No focal midline tenderness with palpation Normal range of motion of bilateral shoulders Bilateral hips are stable Some abrasion noted on the left forearm  Plan: -Obtain CXR to rule out ribs fracture -No CT head indicated per [place] CT head rule -No cervical CT indicated per [place] C-spine rule -Will obtain head CT if there are any new neurological symptoms or change in mental status. -Patient is instructed to call for RN when getting out of bed.  [name], DO

## 2020-01-13 NOTE — Progress Notes (Addendum)
Subjective:   Hospital day: 8  Overnight event: No acute event  Patient is seen at bedside.  He states that he still feel dizzy with ambulation.  States that he felt fine right after standing up but starting to feel dizzy after walking short distance, associated with shortness of breath and nausea.  States that it takes about 10 minutes for the episode to resolve.  Per RN, patient was feeling well this morning after sitting up from bed and denies any dizziness.    Objective:  Vital signs in last 24 hours: Vitals:   01/12/20 1311 01/12/20 1350 01/12/20 1631 01/12/20 2115  BP: 101/66  120/63 119/82  Pulse: 92  93 96  Resp: 16  (!) 35 20  Temp: 98.3 F (36.8 C)   97.8 F (36.6 C)  TempSrc:    Oral  SpO2: 94% 96% 96% 95%  Weight:      Height:       CBC Latest Ref Rng & Units 01/07/2020 01/06/2020 01/05/2020  WBC 4.0 - 10.5 K/uL 4.1 4.3 5.6  Hemoglobin 13.0 - 17.0 g/dL 12.7(L) 12.7(L) 13.8  Hematocrit 39 - 52 % 40.9 40.2 43.9  Platelets 150 - 400 K/uL 176 181 214   CMP Latest Ref Rng & Units 01/12/2020 01/11/2020 01/10/2020  Glucose 70 - 99 mg/dL 225(H) 138(H) 61(L)  BUN 8 - 23 mg/dL 19 16 13   Creatinine 0.61 - 1.24 mg/dL 1.41(H) 1.30(H) 1.35(H)  Sodium 135 - 145 mmol/L 137 140 141  Potassium 3.5 - 5.1 mmol/L 4.1 4.3 4.0  Chloride 98 - 111 mmol/L 104 105 106  CO2 22 - 32 mmol/L 23 23 25   Calcium 8.9 - 10.3 mg/dL 9.0 9.2 9.1  Total Protein 6.5 - 8.1 g/dL - - -  Total Bilirubin 0.3 - 1.2 mg/dL - - -  Alkaline Phos 38 - 126 U/L - - -  AST 15 - 41 U/L - - -  ALT 0 - 44 U/L - - -     Physical Exam  Physical Exam Constitutional:      General: He is not in acute distress. HENT:     Head: Normocephalic.  Eyes:     General:        Right eye: No discharge.        Left eye: No discharge.     Extraocular Movements: Extraocular movements intact.     Pupils: Pupils are equal, round, and reactive to light.  Cardiovascular:     Rate and Rhythm: Normal rate and regular  rhythm.  Pulmonary:     Effort: No respiratory distress.     Breath sounds: Normal breath sounds.  Abdominal:     General: Bowel sounds are normal.  Musculoskeletal:     Right lower leg: No edema.     Left lower leg: No edema.  Skin:    General: Skin is warm.  Neurological:     Mental Status: He is alert.     Comments: Patient did well after getting up from bed with assistance, complains of very mild dizziness. Dix-Hallpike maneuver negative x2  Psychiatric:        Mood and Affect: Mood normal.     Assessment/Plan: Regionald Cuff is a 64 year old male with past medical history of hypertension, type 2 diabetes on insulin, major depressive disorder, bipolar disorder, PTSD, renal cell carcinoma s/p nephrectomy, chronic knee pain who admitted hospital for bilateral lower extremity swelling and shortness of breath, found to have a large right pleural effusion  due to acute onset of HFrEF. Now also having orthostatic hypotension.   Principal Problem:   HFrEF (heart failure with reduced ejection fraction) (HCC) Active Problems:   Orthostatic hypotension   HLD (hyperlipidemia)   Essential hypertension   Diabetes type 2, controlled (HCC)   Bilateral lower extremity edema   Shortness of breath   Tobacco use disorder   Obesity   Pleural effusion due to congestive heart failure (HCC)   Aortic atherosclerosis (HCC)   Coronary artery disease   Hepatic cyst  Acute heart failure with reduced EF (25-30%) (newly diagnosed) Transudative right pleural effusion s/p thoracentesis (11/20).  CAD Aortic atherosclosis Chronic hypertension and hyperlipidemia -Appreciate cardiology recommendation -[name] was held due to his orthostatic hypotension. Added TED hose.  -Continue coreg, and lipitor. -Volume status stable.  Appears euvolemic on exam.  Continue Lasix 20 mg daily creatinine stable. -No MRA given AKI Per cardiology -Outpatient ischemic work-up with cardiology     Dizziness Orthostatic hypotension OT yesterday reveals normal vital signs with no orthostatic hypotension.  Patient did complain of feeling dizziness after ambulation but not with changing position from sitting to standing.  BPPV is unlikely with negative Dix-Hallpike maneuver x2.  We attempted to have patient stand up from sitting position, and he only complained of mild dizziness.  Telemetry only shows a few PVCs at that time which makes cardiac causes unlikely.  Other differentials include vertebrobasilar insufficiency versus side effect of mental health medications.  Overall, with the history from patient and RN, it seems that patient is actually improving.  Unsure about the reliability of history given patient's underlying behavioral health issue.  Will do a MOCA test to evaluate for underlying dementia.  Will also repeat orthostatic vital signs and have patient ambulate with assistance. -MOCA test -Pending orthostatic vital signs    Uncontrolled Type 2 diabetes. Fasting CBG at goal but elevated pre-prandial CBG.  -Lantus 15 units daily.   -Increase Novolog 6 Units daily -Sliding scale insulin -Monitor CBG -Consider SGLT2 after discharge      Liver cyst -Follow up outpatient     Major depressive disorder Bipolar disorder PTSD Continue abilify, duloxetine, lamictal, trazodone     Smoking Patient smoked 2 cigarettes a day for more than 10 years -Nicotine gum 2 mg -encourage cessation -Albuterol PRN     Diet: Heart diet IVF: N/A VTE: Lovenox CODE: Full   Prior to Admission Living Arrangement: Home Anticipated Discharge Location: Home with home health Barriers to Discharge:  Orthostatic hypotension/dizziness  [name], DO 01/13/2020, 5:56 AM Pager: [phone] After 5pm on weekdays and 1pm on weekends: On Call pager [phone]

## 2020-01-13 NOTE — Progress Notes (Signed)
Progress Note  Patient Name: [name] Date of Encounter: 01/13/2020  Primary Cardiologist:   Jenkins Rouge, MD   Subjective   He is still dizzy but ambulating without O2.  No pain.  Breathing is short but no distress   Inpatient Medications    Scheduled Meds: . ARIPiprazole  10 mg Oral Daily  . atorvastatin  40 mg Oral Daily  . carvedilol  3.125 mg Oral BID WC  . docusate sodium  100 mg Oral BID  . DULoxetine  20 mg Oral Daily  . enoxaparin (LOVENOX) injection  40 mg Subcutaneous Q24H  . furosemide  20 mg Oral Daily  . insulin aspart  0-15 Units Subcutaneous TID WC  . insulin aspart  6 Units Subcutaneous TID WC  . insulin glargine  15 Units Subcutaneous QHS  . lamoTRIgine  200 mg Oral BID  . senna  1 tablet Oral BID   Continuous Infusions:  PRN Meds: acetaminophen **OR** acetaminophen, albuterol, gabapentin, nicotine polacrilex, traZODone   Vital Signs    Vitals:   01/12/20 1350 01/12/20 1631 01/12/20 2115 01/13/20 0618  BP:  120/63 119/82 119/85  Pulse:  93 96 (!) 103  Resp:  (!) 35 20 18  Temp:   97.8 F (36.6 C) 98.8 F (37.1 C)  TempSrc:   Oral Oral  SpO2: 96% 96% 95% 98%  Weight:      Height:        Intake/Output Summary (Last 24 hours) at 01/13/2020 0744 Last data filed at 01/12/2020 2116 Gross per 24 hour  Intake --  Output 525 ml  Net -525 ml   Filed Weights   01/09/20 0118 01/11/20 0750 01/12/20 0500  Weight: 94.1 kg 84.5 kg 85 kg    Telemetry    NSR - Personally Reviewed  ECG    NA - Personally Reviewed  Physical Exam   GEN: No  acute distress.   Neck: No  JVD Cardiac: RRR, no murmurs, rubs, or gallops.  Respiratory: Clear to auscultation bilaterally. GI: Soft, nontender, non-distended, normal bowel sounds  MS:  No edema; No deformity. Neuro:   Nonfocal  Psych: Oriented and appropriate    Labs    Chemistry Recent Labs  Lab 01/11/20 0419 01/12/20 0157 01/13/20 0637  NA 140 137 139  K 4.3 4.1 4.3  CL 105 104  105  CO2 23 23 23   GLUCOSE 138* 225* 102*  BUN 16 19 16   CREATININE 1.30* 1.41* 1.46*  CALCIUM 9.2 9.0 9.4  GFRNONAA >60 56* 54*  ANIONGAP 12 10 11      Hematology Recent Labs  Lab 01/07/20 0838  WBC 4.1  RBC 4.98  HGB 12.7*  HCT 40.9  MCV 82.1  MCH 25.5*  MCHC 31.1  RDW 14.8  PLT 176    Cardiac EnzymesNo results for input(s): TROPONINI in the last 168 hours. No results for input(s): TROPIPOC in the last 168 hours.   BNPNo results for input(s): BNP, PROBNP in the last 168 hours.   DDimer No results for input(s): DDIMER in the last 168 hours.   Radiology    No results found.  Cardiac Studies   Echo:  Echo 01/05/20 IMPRESSIONS   1. Diffuse hypokinesis worse in the inferior base. Left ventricular  ejection fraction, by estimation, is 25 to 30%. The left ventricle has  severely decreased function. The left ventricle demonstrates global  hypokinesis. The left ventricular internal  cavity size was mildly dilated. Left ventricular diastolic parameters were  normal.  2.  Right ventricular systolic function is normal. The right ventricular  size is normal. There is mildly elevated pulmonary artery systolic  pressure.  3. Left atrial size was moderately dilated.  4. Right atrial size was mildly dilated.  5. The mitral valve is abnormal. Moderate mitral valve regurgitation. No  evidence of mitral stenosis.  6. Tricuspid valve regurgitation is moderate.  7. The aortic valve is tricuspid. Aortic valve regurgitation is not  visualized. Mild aortic valve sclerosis is present, with no evidence of  aortic valve stenosis.  8. The inferior vena cava is dilated in size with <50% respiratory  variability, suggesting right atrial pressure of 15 mmHg.   Patient Profile     64 y.o. male  with HTN, HLD, Tobacco Abuse, Bipolar, prior renal cell cancer s/p nephrecxtomy.  Presentes with DOE and LE Edema, Found to have new HFrEF 25-50% with moderate TR and Moderate MR, TR.    Through course received IV Lasix, with transition to [employer] and Coreg.  Discussion with patient and shared decision making 11/20:  Deferring ischemic eval until outpatient after GDMT.  11/21 IR Thoracentesis 700 removed.  Creatinine has improved with decrease lasix dose. 01/08/20 Orthostatic- persistent through course.  Entresto stopped 01/10/20.  Assessment & Plan    ACUTE SYSTOLIC HF:  Med titration complicated by orthostatic hypotension.  Entresto held.  Continude Coreg.  Added compression socks yesterday. Lasix was reduced.  Net negative 4.1 liters.  No other in patient testing is indicated.    Plan is for out patient ischemia evaluation.    AKI:  Creat is elevated but relatively unchanged today.     We will follow as needed if he remains in the hospital.  We will arrange follow up in our [facility] office.   For questions or updates, please contact [place] Please consult [email or web address] for contact info under Cardiology/STEMI.   Signed, Minus Breeding, MD  01/13/2020, 7:44 AM

## 2020-01-13 NOTE — Progress Notes (Addendum)
Progress Note  Patient Name: [name] Date of Encounter: 01/14/2020  [facility] Cardiologist: Jenkins Rouge, MD   Subjective  Patient states he fell 3 times yesterday due to dizziness with standing. He is afraid to standup today in fear of falling/dizziness.   Weight 84.5-->85kg; I/Os not accurate Cr stable at 1.41 Inpatient Medications    Scheduled Meds: . ARIPiprazole  10 mg Oral Daily  . atorvastatin  40 mg Oral Daily  . carvedilol  3.125 mg Oral BID WC  . docusate sodium  100 mg Oral BID  . DULoxetine  20 mg Oral Daily  . enoxaparin (LOVENOX) injection  40 mg Subcutaneous Q24H  . furosemide  20 mg Oral Daily  . insulin aspart  0-15 Units Subcutaneous TID WC  . insulin aspart  6 Units Subcutaneous TID WC  . insulin glargine  15 Units Subcutaneous QHS  . lamoTRIgine  200 mg Oral BID  . senna  1 tablet Oral BID   Continuous Infusions:  PRN Meds: acetaminophen **OR** acetaminophen, albuterol, gabapentin, nicotine polacrilex   Vital Signs    Vitals:   01/13/20 2154 01/14/20 0000 01/14/20 0400 01/14/20 0546  BP: 115/83   104/64  Pulse: 86   90  Resp: 20 14 18 17   Temp: (!) 97.3 F (36.3 C)   98.1 F (36.7 C)  TempSrc: Oral   Oral  SpO2: 100%   91%  Weight:      Height:        Intake/Output Summary (Last 24 hours) at 01/14/2020 0934 Last data filed at 01/14/2020 6073 Gross per 24 hour  Intake 100 ml  Output 575 ml  Net -475 ml   Last 3 Weights 01/12/2020 01/11/2020 01/09/2020  Weight (lbs) 187 lb 6.3 oz 186 lb 4.6 oz 207 lb 7.3 oz  Weight (kg) 85 kg 84.5 kg 94.1 kg  Some encounter information is confidential and restricted. Go to Review Flowsheets activity to see all data.      Telemetry    NSR - Personally Reviewed  ECG    No new ECG - Personally Reviewed  Physical Exam   GEN: No acute distress.   Neck: No JVD Cardiac: RRR, no murmurs, rubs, or gallops.  Respiratory: Clear to auscultation bilaterally. GI: Soft, nontender,  non-distended  MS: No edema; No deformity. Neuro:  Nonfocal  Psych: Normal affect   Labs    High Sensitivity Troponin:   Recent Labs  Lab 01/04/20 0850  TROPONINIHS 35*      Chemistry Recent Labs  Lab 01/12/20 0157 01/13/20 0637 01/14/20 0142  NA 137 139 137  K 4.1 4.3 4.3  CL 104 105 102  CO2 23 23 23   GLUCOSE 225* 102* 125*  BUN 19 16 20   CREATININE 1.41* 1.46* 1.41*  CALCIUM 9.0 9.4 9.3  GFRNONAA 56* 54* 56*  ANIONGAP 10 11 12      Hematology No results for input(s): WBC, RBC, HGB, HCT, MCV, MCH, MCHC, RDW, PLT in the last 168 hours.  BNPNo results for input(s): BNP, PROBNP in the last 168 hours.   DDimer No results for input(s): DDIMER in the last 168 hours.   Radiology    DG CHEST PORT 1 VIEW  Result Date: 01/13/2020 CLINICAL DATA:  Cardiomegaly EXAM: PORTABLE CHEST 1 VIEW COMPARISON:  January 08, 2020 FINDINGS: There is stable cardiomegaly with pulmonary vascularity normal. There is mild right base atelectasis. Lungs elsewhere are clear. No adenopathy. No bone lesions. IMPRESSION: Stable cardiomegaly. Mild right base atelectasis. Lungs  otherwise clear. No evident adenopathy. Electronically Signed   By: Lowella Grip III M.D.   On: 01/13/2020 14:23    Cardiac Studies   Echo 01/05/20 IMPRESSIONS  1. Diffuse hypokinesis worse in the inferior base. Left ventricular  ejection fraction, by estimation, is 25 to 30%. The left ventricle has  severely decreased function. The left ventricle demonstrates global  hypokinesis. The left ventricular internal  cavity size was mildly dilated. Left ventricular diastolic parameters were  normal.  2. Right ventricular systolic function is normal. The right ventricular  size is normal. There is mildly elevated pulmonary artery systolic  pressure.  3. Left atrial size was moderately dilated.  4. Right atrial size was mildly dilated.  5. The mitral valve is abnormal. Moderate mitral valve regurgitation. No   evidence of mitral stenosis.  6. Tricuspid valve regurgitation is moderate.  7. The aortic valve is tricuspid. Aortic valve regurgitation is not  visualized. Mild aortic valve sclerosis is present, with no evidence of  aortic valve stenosis.  8. The inferior vena cava is dilated in size with <50% respiratory  variability, suggesting right atrial pressure of 15 mmHg.    Patient Profile     64 y.o. male with HTN, HLD, tobacco abuse, bipolar disorder, RCC s/p nephrectomy who presented with DOE found to have HFrEF 25-30% with moderate TR and MR. Plan for GDMT and pursuing ischemic work-up as out-patient. Course complicated by orthostatic hypotension.  Assessment & Plan    #Acute systolic heart failure LVEF 25-30%. Currently with NYHA class II symptoms and clinically improving. Appears euvolemic. GDMT limited due to symptomatic hypotension. -Off entresto due to orthostatic hypotension -Will change coreg to metop 12.5mg  XL to start tomorrow as has less effect on blood pressure -Will hold lasix dose tomorrow and start again on 12/1 given significant orthostatic symptoms -Unfortunately, GDMT very limited due to orthostasis -Ischemic work-up as out-patient  #AKI Stable. Cr 1.4 today -Renally dose medications -Lasix holiday tomorrow with plans to resume on 12/1  #HTN: Now with orthostatic hypotension limiting ability to add GDMT for HFrEF. -Change from coreg to metop as above -Hold lasix dose tomorrow; resume 12/1  #HLD -Continue atorvastatin    For questions or updates, please contact [place] Please consult [email or web address] for contact info under        Signed, Freada Bergeron, MD  01/14/2020, 9:34 AM

## 2020-01-14 ENCOUNTER — Inpatient Hospital Stay (HOSPITAL_COMMUNITY): Payer: No Typology Code available for payment source

## 2020-01-14 DIAGNOSIS — I5020 Unspecified systolic (congestive) heart failure: Secondary | ICD-10-CM | POA: Diagnosis not present

## 2020-01-14 LAB — GLUCOSE, CAPILLARY
Glucose-Capillary: 162 mg/dL — ABNORMAL HIGH (ref 70–99)
Glucose-Capillary: 201 mg/dL — ABNORMAL HIGH (ref 70–99)
Glucose-Capillary: 71 mg/dL (ref 70–99)
Glucose-Capillary: 282 mg/dL — ABNORMAL HIGH (ref 70–99)

## 2020-01-14 LAB — BASIC METABOLIC PANEL
Anion gap: 12 (ref 5–15)
BUN: 20 mg/dL (ref 8–23)
CO2: 23 mmol/L (ref 22–32)
Calcium: 9.3 mg/dL (ref 8.9–10.3)
Chloride: 102 mmol/L (ref 98–111)
Creatinine, Ser: 1.41 mg/dL — ABNORMAL HIGH (ref 0.61–1.24)
GFR, Estimated: 56 mL/min — ABNORMAL LOW (ref >60)
Glucose, Bld: 125 mg/dL — ABNORMAL HIGH (ref 70–99)
Potassium: 4.3 mmol/L (ref 3.5–5.1)
Sodium: 137 mmol/L (ref 135–145)

## 2020-01-14 MED ORDER — GADOBUTROL 1 MMOL/ML IV SOLN
9.0000 mL | Freq: Once | INTRAVENOUS | Status: AC | PRN
  Administered 2020-01-14: 9 mL via INTRAVENOUS

## 2020-01-14 MED ORDER — METOPROLOL SUCCINATE ER 25 MG PO TB24
12.5000 mg | ORAL_TABLET | Freq: Every day | ORAL | Status: DC
  Administered 2020-01-15 – 2020-01-25 (×11): 12.5 mg via ORAL
  Filled 2020-01-14 (×11): qty 1

## 2020-01-14 MED ORDER — FUROSEMIDE 20 MG PO TABS: 20.0000 mg | ORAL_TABLET | Freq: Every day | ORAL | Status: DC

## 2020-01-14 MED ORDER — LIDOCAINE 5 % EX PTCH
1.0000 | MEDICATED_PATCH | CUTANEOUS | Status: DC
  Administered 2020-01-14 – 2020-01-25 (×9): 1 via TRANSDERMAL
  Filled 2020-01-14 (×10): qty 1

## 2020-01-14 NOTE — Progress Notes (Signed)
Orthostatic vitals completed, charted in EHR. Patient negative for orthostatic hypotension. Patient complains of dizziness when changing positions.

## 2020-01-14 NOTE — Progress Notes (Signed)
Occupational Therapy Treatment Patient Details Name: [name] MRN: [id number] DOB: 1955/07/19 Today's Date: 01/14/2020    History of present illness 64yo male c/o DOE and BLE edema, CXR with volume overload and R pleural effusion. Significant systolic impairment found on ECHO. Negative for PE. PMH anxiety, CA, DM, h/o suicide attempt, HTN, HLD, osteoporosis   OT comments  Pt making steady progress towards OT goals this session. Session focus on functional mobility as precursor to higher level BADLs. Pt continues to present with increased pain, generalized weakness, decreased activity tolerance and cognitive impairments impacting pts ability to complete BADLs independently. Pt completed x2 sit<>stands with RW and MIN- MOD A. Pt declined OOB transfer secondary to reports of dizziness however pt asymptomatic and RN reports having just taken orthostatics. Pt more agreeable to SNF placement after extensive education. Pt would continue to benefit from skilled occupational therapy while admitted and after d/c to address the below listed limitations in order to improve overall functional mobility and facilitate independence with BADL participation. DC plan remains appropriate, will follow acutely per POC.     Follow Up Recommendations  SNF;Supervision/Assistance - 24 hour    Equipment Recommendations  3 in 1 bedside commode;Tub/shower seat    Recommendations for Other Services      Precautions / Restrictions Precautions Precautions: Fall Restrictions Weight Bearing Restrictions: No       Mobility Bed Mobility Overal bed mobility: Needs Assistance Bed Mobility: Supine to Sit;Sit to Supine     Supine to sit: Mod assist Sit to supine: Min assist   General bed mobility comments: heavy MOD A to elevate trunk into sitting from flat HOB; light MIN A to return BLES to supine  Transfers Overall transfer level: Needs assistance Equipment used: Rolling walker (2 wheeled) Transfers:  Sit to/from Stand Sit to Stand: Min assist;Mod assist         General transfer comment: pt completed x2 sit<>stands from EOB with pt initially needing MOD A to power into standing but progressing to MIN A as session progressed, cues for hand placement needed each time. pt declined stand pivot transfer secondary to dizziness however ?pts fear of falling as pt had fall earlier this week    Balance Overall balance assessment: Needs assistance Sitting-balance support: No upper extremity supported;Feet supported Sitting balance-[name]: Good     Standing balance support: Bilateral upper extremity supported Standing balance-[name]: Poor Standing balance comment: reliant on BUE support                           ADL either performed or assessed with clinical judgement   ADL Overall ADL's : Needs assistance/impaired                         Toilet Transfer: Minimal assistance;Moderate assistance (sit<>stand x2 only with RW)           Functional mobility during ADLs: Minimal assistance;Moderate assistance (sit<>stand only) General ADL Comments: pt continues to endorse dizziness with mobility however pt asymptomatic. pt continues to present with increased pain, generalized weakness and decreased activity tolerance     Vision Patient Visual Report: No change from baseline     Perception     Praxis      Cognition Arousal/Alertness: Awake/alert Behavior During Therapy: WFL for tasks assessed/performed Overall Cognitive Status: Impaired/Different from baseline Area of Impairment: Safety/judgement;Problem solving;Awareness;Attention  Current Attention Level: Focused     Safety/Judgement: Decreased awareness of safety;Decreased awareness of deficits Awareness: Intellectual Problem Solving: Requires verbal cues;Slow processing General Comments: pt with decreased insight into deficits. pt reports dizziness throughout session but  appears asymptomatic ( RN reports having just taken orthostatics). pt initially declining SNF at end of session but later states that "I do need that"        Exercises     Shoulder Instructions       General Comments c/o dizziness however no symptoms noted    Pertinent Vitals/ Pain       Pain Assessment: Faces Faces Pain Scale: Hurts a little bit Pain Location: L ribs Pain Descriptors / Indicators: Aching;Guarding;Grimacing Pain Intervention(s): Monitored during session;Repositioned  Home Living                                          Prior Functioning/Environment              Frequency  Min 2X/week        Progress Toward Goals  OT Goals(current goals can now be found in the care plan section)  Progress towards OT goals: Progressing toward goals  Acute Rehab OT Goals Patient Stated Goal: to go home OT Goal Formulation: With patient Time For Goal Achievement: 01/23/20 Potential to Achieve Goals: Good  Plan Discharge plan remains appropriate;Frequency remains appropriate    Co-evaluation                 AM-PAC OT "6 Clicks" Daily Activity     Outcome Measure   Help from another person eating meals?: None Help from another person taking care of personal grooming?: A Little Help from another person toileting, which includes using toliet, bedpan, or urinal?: A Lot Help from another person bathing (including washing, rinsing, drying)?: A Little Help from another person to put on and taking off regular upper body clothing?: A Little Help from another person to put on and taking off regular lower body clothing?: A Little 6 Click Score: 18    End of Session Equipment Utilized During Treatment: Gait belt;Rolling walker  OT Visit Diagnosis: Unsteadiness on feet (R26.81);Other abnormalities of gait and mobility (R26.89);Muscle weakness (generalized) (M62.81);Dizziness and giddiness (R42)   Activity Tolerance Patient tolerated treatment  well   Patient Left in bed;with call bell/phone within reach;with bed alarm set;Other (comment) (bed in lowest position; safety mats positioned on floor)   Nurse Communication Mobility status        Time: 7106-2694 OT Time Calculation (min): 13 min  Charges: OT General Charges $OT Visit: 1 Visit OT Treatments $Therapeutic Activity: 8-22 mins  Lanier Clam., COTA/L Acute Rehabilitation Services [phone]    Ihor Gully 01/14/2020, 4:17 PM

## 2020-01-14 NOTE — Progress Notes (Signed)
Subjective:   Hospital day: 10  Overnight event: Patient had an unwitnessed fall episode yesterday.  He reports hitting his head multiple times.  Neuro exam was unremarkable.  Chest x-ray was obtained due to complaint of chest wall tenderness and came back negative for rib fracture.  No CT head was indicated per [place] CT head rule.  Patient is seen at bedside today.  Complains of persistent pain on the left side of chest wall.  He states that his pain on his head significantly improved.  He denies any episodes of emesis or new neurological deficits after the fall.  Objective:  Vital signs in last 24 hours: Vitals:   01/13/20 2154 01/14/20 0000 01/14/20 0400 01/14/20 0546  BP: 115/83   104/64  Pulse: 86   90  Resp: 20 14 18 17   Temp: (!) 97.3 F (36.3 C)   98.1 F (36.7 C)  TempSrc: Oral   Oral  SpO2: 100%   91%  Weight:      Height:       CBC Latest Ref Rng & Units 01/07/2020 01/06/2020 01/05/2020  WBC 4.0 - 10.5 K/uL 4.1 4.3 5.6  Hemoglobin 13.0 - 17.0 g/dL 12.7(L) 12.7(L) 13.8  Hematocrit 39 - 52 % 40.9 40.2 43.9  Platelets 150 - 400 K/uL 176 181 214   CMP Latest Ref Rng & Units 01/14/2020 01/13/2020 01/12/2020  Glucose 70 - 99 mg/dL 125(H) 102(H) 225(H)  BUN 8 - 23 mg/dL 20 16 19   Creatinine 0.61 - 1.24 mg/dL 1.41(H) 1.46(H) 1.41(H)  Sodium 135 - 145 mmol/L 137 139 137  Potassium 3.5 - 5.1 mmol/L 4.3 4.3 4.1  Chloride 98 - 111 mmol/L 102 105 104  CO2 22 - 32 mmol/L 23 23 23   Calcium 8.9 - 10.3 mg/dL 9.3 9.4 9.0  Total Protein 6.5 - 8.1 g/dL - - -  Total Bilirubin 0.3 - 1.2 mg/dL - - -  Alkaline Phos 38 - 126 U/L - - -  AST 15 - 41 U/L - - -  ALT 0 - 44 U/L - - -     Physical Exam  Physical Exam Constitutional:      General: He is not in acute distress. HENT:     Head: Normocephalic and atraumatic.     Comments: No pain to palpation Eyes:     General:        Right eye: No discharge.        Left eye: No discharge.     Pupils: Pupils are equal, round,  and reactive to light.     Comments: No raccoon's eyes  Cardiovascular:     Rate and Rhythm: Normal rate and regular rhythm.  Pulmonary:     Effort: No respiratory distress.     Breath sounds: Normal breath sounds.     Comments: No bruises or hematoma observed on the left chest wall Musculoskeletal:     Cervical back: Normal range of motion. No tenderness.     Right lower leg: No edema.     Left lower leg: No edema.  Skin:    General: Skin is warm.  Neurological:     Mental Status: He is alert.  Psychiatric:        Mood and Affect: Mood normal.     Assessment/Plan: Regionald Hubbardis a 64 year old male with past medical history ofhypertension, type 2 diabetes on insulin, major depressive disorder, bipolar disorder, PTSD, renal cell carcinomas/pnephrectomy, chronic knee pain whoadmittedhospital for bilateral lower extremity swelling and shortness  of breath,found to have a large right pleural effusiondue to acute onset of HFrEF. Barrier for discharge is unclear causes of dizziness.  Principal Problem:   HFrEF (heart failure with reduced ejection fraction) (HCC) Active Problems:   Orthostatic hypotension   HLD (hyperlipidemia)   Essential hypertension   Diabetes type 2, controlled (HCC)   Bilateral lower extremity edema   Shortness of breath   Tobacco use disorder   Obesity   Pleural effusion due to congestive heart failure (HCC)   Aortic atherosclerosis (HCC)   Coronary artery disease   Hepatic cyst  Acute heart failure with reduced EF(25-30%) (newly diagnosed) Transudative right pleural effusion s/p thoracentesis (11/20). CAD Aortic atherosclosis Chronic hypertension and hyperlipidemia -Appreciate cardiology recommendation -Holding Entresto -Continuecoreg, and lipitor. -Appears euvolemic on exam. Continue Lasix 20 mg daily -No MRA given AKI Per cardiology -Outpatient ischemic work-up with cardiology   Dizziness Orthostatic hypotension Patient had  a fall yesterday however he denies feeling dizzy.  CT head was not indicated per [place] CT head rule.  Chest x-ray is negative for rib fracture. Will obtain orthostatic vital signs today.  If normal, we will proceed with MRA to rule out vertebrobasilar insufficiencies. Will also attempt MOCA test to evaluate for underlying dementia.   -MOCA test -Pending orthostatic vital signs   UncontrolledType 2 diabetes. CBG spiked at 393 at noon yesterday in which 11 units of NovoLog was given.  CBG then dropped to 53 at 4 PM, improved to 88 with after given orange juice.  Unclear cause of the spike.  Fasting CBG this morning of 162.  Will continue the current regimen and monitor CBG closely. -Lantus 15 units daily. - Novolog 6Units daily -Sliding scale insulin -Monitor CBG -Consider SGLT2 after discharge   Liver cyst -Follow up outpatient   Major depressive disorder Bipolar disorder PTSD Continue abilify, duloxetine, lamictal, trazodone   Smoking Patient smoked 2 cigarettes a day for more than 10 years -Nicotine gum 2 mg -encourage cessation -Albuterol PRN   Diet: Heart diet IVF: N/A VTE: Lovenox CODE: Full  Prior to Admission Living Arrangement: Home Anticipated Discharge [place] home health Barriers to [place] hypotension/dizziness  [name], DO 01/14/2020, 6:00 AM Pager: [phone] After 5pm on weekdays and 1pm on weekends: On Call pager [phone]

## 2020-01-15 DIAGNOSIS — I5020 Unspecified systolic (congestive) heart failure: Secondary | ICD-10-CM | POA: Diagnosis not present

## 2020-01-15 DIAGNOSIS — W19XXXA Unspecified fall, initial encounter: Secondary | ICD-10-CM

## 2020-01-15 DIAGNOSIS — R0789 Other chest pain: Secondary | ICD-10-CM

## 2020-01-15 LAB — BASIC METABOLIC PANEL
Anion gap: 11 (ref 5–15)
BUN: 19 mg/dL (ref 8–23)
CO2: 24 mmol/L (ref 22–32)
Calcium: 9.3 mg/dL (ref 8.9–10.3)
Chloride: 103 mmol/L (ref 98–111)
Creatinine, Ser: 1.41 mg/dL — ABNORMAL HIGH (ref 0.61–1.24)
GFR, Estimated: 56 mL/min — ABNORMAL LOW (ref >60)
Glucose, Bld: 116 mg/dL — ABNORMAL HIGH (ref 70–99)
Potassium: 4.1 mmol/L (ref 3.5–5.1)
Sodium: 138 mmol/L (ref 135–145)

## 2020-01-15 LAB — GLUCOSE, CAPILLARY
Glucose-Capillary: 135 mg/dL — ABNORMAL HIGH (ref 70–99)
Glucose-Capillary: 224 mg/dL — ABNORMAL HIGH (ref 70–99)
Glucose-Capillary: 119 mg/dL — ABNORMAL HIGH (ref 70–99)

## 2020-01-15 MED ORDER — PANTOPRAZOLE SODIUM 40 MG PO TBEC
40.0000 mg | DELAYED_RELEASE_TABLET | Freq: Every day | ORAL | Status: DC
  Administered 2020-01-15 – 2020-01-25 (×11): 40 mg via ORAL
  Filled 2020-01-15 (×11): qty 1

## 2020-01-15 MED ORDER — FUROSEMIDE 20 MG PO TABS
20.0000 mg | ORAL_TABLET | Freq: Every day | ORAL | Status: DC
  Administered 2020-01-15 – 2020-01-22 (×8): 20 mg via ORAL
  Filled 2020-01-15 (×8): qty 1

## 2020-01-15 NOTE — Progress Notes (Signed)
Subjective:   Hospital day: 10  Overnight event: no acute event  Patient is sitting on recliner chair during examination.  States that he has been walking with physical therapy and only felt very mild dizziness.  Patient agrees with going to a rehab facility for physical therapy because he afraid that he will fall at home.  Objective:  Vital signs in last 24 hours: Vitals:   01/14/20 1551 01/14/20 2207 01/15/20 0104 01/15/20 0527  BP: 119/82 134/76  126/74  Pulse: 93 97  (!) 110  Resp: 20 20  20   Temp: 97.7 F (36.5 C) 98.4 F (36.9 C)  98.4 F (36.9 C)  TempSrc: Oral Oral  Oral  SpO2: 100% 100%  100%  Weight:  83 kg 83 kg   Height:       CBC Latest Ref Rng & Units 01/07/2020 01/06/2020 01/05/2020  WBC 4.0 - 10.5 K/uL 4.1 4.3 5.6  Hemoglobin 13.0 - 17.0 g/dL 12.7(L) 12.7(L) 13.8  Hematocrit 39 - 52 % 40.9 40.2 43.9  Platelets 150 - 400 K/uL 176 181 214   CMP Latest Ref Rng & Units 01/15/2020 01/14/2020 01/13/2020  Glucose 70 - 99 mg/dL 116(H) 125(H) 102(H)  BUN 8 - 23 mg/dL 19 20 16   Creatinine 0.61 - 1.24 mg/dL 1.41(H) 1.41(H) 1.46(H)  Sodium 135 - 145 mmol/L 138 137 139  Potassium 3.5 - 5.1 mmol/L 4.1 4.3 4.3  Chloride 98 - 111 mmol/L 103 102 105  CO2 22 - 32 mmol/L 24 23 23   Calcium 8.9 - 10.3 mg/dL 9.3 9.3 9.4  Total Protein 6.5 - 8.1 g/dL - - -  Total Bilirubin 0.3 - 1.2 mg/dL - - -  Alkaline Phos 38 - 126 U/L - - -  AST 15 - 41 U/L - - -  ALT 0 - 44 U/L - - -    Physical Exam  Physical Exam Constitutional:      General: He is not in acute distress. HENT:     Head: Normocephalic.  Eyes:     General:        Right eye: No discharge.        Left eye: No discharge.  Cardiovascular:     Rate and Rhythm: Normal rate and regular rhythm.  Pulmonary:     Effort: No respiratory distress.  Abdominal:     General: Bowel sounds are normal.  Musculoskeletal:     Right lower leg: No edema.     Left lower leg: No edema.  Neurological:     Mental Status: He  is alert.     Assessment/Plan: Regionald Hubbardis a 64 year old male with past medical history ofhypertension, type 2 diabetes on insulin, major depressive disorder, bipolar disorder, PTSD, renal cell carcinomas/pnephrectomy, chronic knee pain whoadmittedhospital for bilateral lower extremity swelling and shortness of breath,found to have a large right pleural effusiondue to acute onset of HFrEF.   His dizziness is improving.  Principal Problem:   HFrEF (heart failure with reduced ejection fraction) (HCC) Active Problems:   Orthostatic hypotension   HLD (hyperlipidemia)   Essential hypertension   Diabetes type 2, controlled (HCC)   Bilateral lower extremity edema   Shortness of breath   Tobacco use disorder   Obesity   Pleural effusion due to congestive heart failure (HCC)   Aortic atherosclerosis (HCC)   Coronary artery disease   Hepatic cyst  Acute heart failure with reduced EF(25-30%) (newly diagnosed) Transudative right pleural effusion s/p thoracentesis (11/20). CAD Aortic atherosclosis Chronic hypertension and  hyperlipidemia -Appreciate cardiology recommendation -Holding Entresto -Continue Lipitor. -Switch to metoprolol succinate 12.5 mg daily for last blood pressure fact -Resume Lasix 20 mg daily -No MRAgiven AKI Per cardiology -Outpatient ischemic work-up with cardiology   Dizziness Orthostatic hypotension MRI and MRA brain is unremarkable except for mild to moderate cerebral atrophy which is advanced for his age.  This explains his MOCA score of 14.  No evidence of vertebrobasilar insufficiencies.  His dizziness is improving according to physical therapy note today.  Plan is to continue physical therapy inpatient and proceed with SNF placement.  Social worker is is working with the [employer] and has his paperwork submitted today. -Ending SNF placement   UncontrolledType 2 diabetes. Fasting CBG 116.  Prandial CBGs elevated to 24 -Rishi 8 diabetic  coordinator recommendations -Lantus 15 units daily. -IncreaseNovologto 8Units TID -Sliding scale insulin -Monitor CBG -Consider SGLT2    Liver cyst -Follow up outpatient   Major depressive disorder Bipolar disorder PTSD Continue abilify, duloxetine, lamictal, trazodone   Smoking Patient smoked 2 cigarettes a day for more than 10 years -Nicotine gum 2 mg -encourage cessation -Albuterol PRN   Diet: Heart diet IVF: N/A VTE: Lovenox CODE: Full  Prior to Admission Living Arrangement: Home Anticipated Discharge Location:SNF Barriers to Discharge:SNF placement  [name], DO 01/15/2020, 6:16 AM Pager: [phone] After 5pm on weekdays and 1pm on weekends: On Call pager [phone]

## 2020-01-15 NOTE — Progress Notes (Signed)
Progress Note  Patient Name: [name] Date of Encounter: 01/15/2020  [facility] Cardiologist: Jenkins Rouge, MD   Subjective   Patient feels better this morning. Dizziness improved but feels unsteady on his feet.   Orthostatic negative yesterday Per OT, recommend SNF at discharge. Cr stable at 1.41; Net positive 200cc Wt stable at 183lbs  Inpatient Medications    Scheduled Meds: . ARIPiprazole  10 mg Oral Daily  . atorvastatin  40 mg Oral Daily  . docusate sodium  100 mg Oral BID  . DULoxetine  20 mg Oral Daily  . enoxaparin (LOVENOX) injection  40 mg Subcutaneous Q24H  . [START ON 01/16/2020] furosemide  20 mg Oral Daily  . insulin aspart  0-15 Units Subcutaneous TID WC  . insulin aspart  6 Units Subcutaneous TID WC  . insulin glargine  15 Units Subcutaneous QHS  . lamoTRIgine  200 mg Oral BID  . lidocaine  1 patch Transdermal Q24H  . metoprolol succinate  12.5 mg Oral Daily  . senna  1 tablet Oral BID   Continuous Infusions:  PRN Meds: acetaminophen **OR** acetaminophen, albuterol, gabapentin, nicotine polacrilex   Vital Signs    Vitals:   01/14/20 1551 01/14/20 2207 01/15/20 0104 01/15/20 0527  BP: 119/82 134/76  126/74  Pulse: 93 97  (!) 110  Resp: 20 20  20   Temp: 97.7 F (36.5 C) 98.4 F (36.9 C)  98.4 F (36.9 C)  TempSrc: Oral Oral  Oral  SpO2: 100% 100%  100%  Weight:  83 kg 83 kg   Height:        Intake/Output Summary (Last 24 hours) at 01/15/2020 0752 Last data filed at 01/14/2020 2044 Gross per 24 hour  Intake 600 ml  Output 400 ml  Net 200 ml   Last 3 Weights 01/15/2020 01/14/2020 01/14/2020  Weight (lbs) 183 lb 183 lb 192 lb 10.9 oz  Weight (kg) 83.008 kg 83.008 kg 87.4 kg  Some encounter information is confidential and restricted. Go to Review Flowsheets activity to see all data.      Telemetry    Sinus tachycardia - Personally Reviewed  ECG    No new ECG - Personally Reviewed  Physical Exam   GEN: No acute  distress.   Neck: No JVD Cardiac: RRR, no murmurs, rubs, or gallops.  Respiratory: Clear to auscultation bilaterally. GI: Soft, nontender, non-distended  MS: No edema; No deformity. Neuro:  Nonfocal  Psych: Normal affect   Labs    High Sensitivity Troponin:   Recent Labs  Lab 01/04/20 0850  TROPONINIHS 35*      Chemistry Recent Labs  Lab 01/13/20 0637 01/14/20 0142 01/15/20 0501  NA 139 137 138  K 4.3 4.3 4.1  CL 105 102 103  CO2 23 23 24   GLUCOSE 102* 125* 116*  BUN 16 20 19   CREATININE 1.46* 1.41* 1.41*  CALCIUM 9.4 9.3 9.3  GFRNONAA 54* 56* 56*  ANIONGAP 11 12 11      HematologyNo results for input(s): WBC, RBC, HGB, HCT, MCV, MCH, MCHC, RDW, PLT in the last 168 hours.  BNPNo results for input(s): BNP, PROBNP in the last 168 hours.   DDimer No results for input(s): DDIMER in the last 168 hours.   Radiology    MR ANGIO HEAD WO CONTRAST  Result Date: 01/14/2020 CLINICAL DATA:  Dizziness, nonspecific. Additional history provided: Patient reports falling 3 times yesterday due to dizziness with standing. EXAM: MRI HEAD WITHOUT AND WITH CONTRAST MRA HEAD WITHOUT CONTRAST  TECHNIQUE: Multiplanar, multiecho pulse sequences of the brain and surrounding structures were obtained without and with intravenous contrast. Angiographic images of the head were obtained using MRA technique without contrast. CONTRAST:  7mL GADAVIST GADOBUTROL 1 MMOL/ML IV SOLN COMPARISON:  Noncontrast head CT 06/17/2011. FINDINGS: MRI HEAD FINDINGS Brain: The examination is intermittently motion degraded. Most notably, there is moderate motion degradation of the sagittal T1 weighted sequence. Mild-to-moderate generalized cerebral atrophy, advanced for age. No significant white matter disease for age. There is no acute infarct. No evidence of intracranial mass. No chronic intracranial blood products. No extra-axial fluid collection. No midline shift. No abnormal intracranial enhancement. Vascular: Normal  proximal arterial flow voids. Expected vascular enhancement. Prominent arachnoid granulation within the distal left transverse sinus. Skull and upper cervical spine: No focal marrow lesion is identified Sinuses/Orbits: Visualized orbits show no acute finding. No significant paranasal sinus disease. Other: Mucosal thickening within the left nasal passage. MRA HEAD FINDINGS Mildly motion degraded examination. The intracranial internal carotid arteries are patent. The M1 middle cerebral arteries are patent. No M2 proximal branch occlusion or high-grade proximal stenosis is identified. The anterior cerebral arteries are patent. The A1 left anterior cerebral artery is developmentally hypoplastic or absent. The visualized intracranial vertebral arteries are patent without stenosis. The basilar artery is patent. Predominantly fetal origin left posterior cerebral artery. The posterior cerebral arteries are patent. A right posterior communicating artery is present. No intracranial aneurysm is identified. IMPRESSION: MRI brain: 1. Motion degraded examination as described. 2. No evidence of acute intracranial abnormality, including acute infarction. 3. Mild-to-moderate cerebral atrophy, advanced for age. MRA head: Unremarkable examination. No intracranial large vessel occlusion or proximal high-grade arterial stenosis. Electronically Signed   By: Kellie Simmering DO   On: 01/14/2020 14:17   MR ANGIO NECK W WO CONTRAST  Result Date: 01/14/2020 CLINICAL DATA:  Dementia, vascular suspected. Additional provided: Patient reports falling 3 times yesterday due to dizziness with standing. EXAM: MRA NECK WITHOUT AND WITH CONTRAST TECHNIQUE: Multiplanar and multiecho pulse sequences of the neck were obtained without and with intravenous contrast. Angiographic images of the neck were obtained using MRA technique without and with intravenous contrast. CONTRAST:  93mL GADAVIST GADOBUTROL 1 MMOL/ML IV SOLN COMPARISON:  No pertinent prior  exams available for comparison. FINDINGS: Common origin of the innominate and left common carotid arteries. The visualized aortic arch is otherwise unremarkable. The bilateral common and internal carotid arteries are patent within the neck without stenosis. The vertebral arteries are codominant and patent within the neck with antegrade flow, and without significant stenosis. IMPRESSION: The bilateral common carotid, internal carotid and vertebral arteries are patent within the neck without significant stenosis. Electronically Signed   By: Kellie Simmering DO   On: 01/14/2020 14:22   MR BRAIN W WO CONTRAST  Result Date: 01/14/2020 CLINICAL DATA:  Dizziness, nonspecific. Additional history provided: Patient reports falling 3 times yesterday due to dizziness with standing. EXAM: MRI HEAD WITHOUT AND WITH CONTRAST MRA HEAD WITHOUT CONTRAST TECHNIQUE: Multiplanar, multiecho pulse sequences of the brain and surrounding structures were obtained without and with intravenous contrast. Angiographic images of the head were obtained using MRA technique without contrast. CONTRAST:  47mL GADAVIST GADOBUTROL 1 MMOL/ML IV SOLN COMPARISON:  Noncontrast head CT 06/17/2011. FINDINGS: MRI HEAD FINDINGS Brain: The examination is intermittently motion degraded. Most notably, there is moderate motion degradation of the sagittal T1 weighted sequence. Mild-to-moderate generalized cerebral atrophy, advanced for age. No significant white matter disease for age. There is no acute infarct. No  evidence of intracranial mass. No chronic intracranial blood products. No extra-axial fluid collection. No midline shift. No abnormal intracranial enhancement. Vascular: Normal proximal arterial flow voids. Expected vascular enhancement. Prominent arachnoid granulation within the distal left transverse sinus. Skull and upper cervical spine: No focal marrow lesion is identified Sinuses/Orbits: Visualized orbits show no acute finding. No significant  paranasal sinus disease. Other: Mucosal thickening within the left nasal passage. MRA HEAD FINDINGS Mildly motion degraded examination. The intracranial internal carotid arteries are patent. The M1 middle cerebral arteries are patent. No M2 proximal branch occlusion or high-grade proximal stenosis is identified. The anterior cerebral arteries are patent. The A1 left anterior cerebral artery is developmentally hypoplastic or absent. The visualized intracranial vertebral arteries are patent without stenosis. The basilar artery is patent. Predominantly fetal origin left posterior cerebral artery. The posterior cerebral arteries are patent. A right posterior communicating artery is present. No intracranial aneurysm is identified. IMPRESSION: MRI brain: 1. Motion degraded examination as described. 2. No evidence of acute intracranial abnormality, including acute infarction. 3. Mild-to-moderate cerebral atrophy, advanced for age. MRA head: Unremarkable examination. No intracranial large vessel occlusion or proximal high-grade arterial stenosis. Electronically Signed   By: Kellie Simmering DO   On: 01/14/2020 14:17   DG CHEST PORT 1 VIEW  Result Date: 01/13/2020 CLINICAL DATA:  Cardiomegaly EXAM: PORTABLE CHEST 1 VIEW COMPARISON:  January 08, 2020 FINDINGS: There is stable cardiomegaly with pulmonary vascularity normal. There is mild right base atelectasis. Lungs elsewhere are clear. No adenopathy. No bone lesions. IMPRESSION: Stable cardiomegaly. Mild right base atelectasis. Lungs otherwise clear. No evident adenopathy. Electronically Signed   By: Lowella Grip III M.D.   On: 01/13/2020 14:23    Cardiac Studies   Echo 01/05/20 IMPRESSIONS  1. Diffuse hypokinesis worse in the inferior base. Left ventricular  ejection fraction, by estimation, is 25 to 30%. The left ventricle has  severely decreased function. The left ventricle demonstrates global  hypokinesis. The left ventricular internal  cavity size was  mildly dilated. Left ventricular diastolic parameters were  normal.  2. Right ventricular systolic function is normal. The right ventricular  size is normal. There is mildly elevated pulmonary artery systolic  pressure.  3. Left atrial size was moderately dilated.  4. Right atrial size was mildly dilated.  5. The mitral valve is abnormal. Moderate mitral valve regurgitation. No  evidence of mitral stenosis.  6. Tricuspid valve regurgitation is moderate.  7. The aortic valve is tricuspid. Aortic valve regurgitation is not  visualized. Mild aortic valve sclerosis is present, with no evidence of  aortic valve stenosis.  8. The inferior vena cava is dilated in size with <50% respiratory  variability, suggesting right atrial pressure of 15 mmHg.  Patient Profile     64 y.o. male with HTN, HLD, tobacco abuse, bipolar disorder, RCC s/p nephrectomy who presented with DOE found to have HFrEF 25-30% with moderate TR and MR. Plan for GDMT and pursuing ischemic work-up as out-patient. Course complicated by orthostatic hypotension.  Assessment & Plan    #Acute systolic heart failure LVEF 25-30%. Currently with NYHA class II symptoms and clinically improving. Appears euvolemic with dry weight 183lbs. GDMT limited due to symptomatic orthostatic hypotension. -Off entresto due to orthostatic hypotension -Changed from coreg to metoprolol to for less blood pressure effect -Resume maintenance lasix 20mg  daily given improvement in symptoms and orthostatics negative -Unfortunately, GDMT very limited due to orthostasis -Ischemic work-up as out-patient -Monitor I/Os and daily weights -Dry weight 183lbs  #AKI Stable.  Cr 1.4 today -Renally dose medications -Resume lasix 20mg  daily  #HTN: Now with orthostatic hypotension limiting ability to add GDMT for HFrEF. -Changed from coreg to metop as above -Resume lasix 20mg  daily -Off entresto due to orthostasis  #HLD -Continue atorvastatin    Patient is cleared from a CV standpoint to go to SNF. Will arrange for follow-up with Cardiology for further management.  For questions or updates, please contact [place] Please consult [email or web address] for contact info under        Signed, Freada Bergeron, MD  01/15/2020, 7:52 AM

## 2020-01-15 NOTE — Progress Notes (Signed)
Occupational Therapy Treatment Patient Details Name: [name] MRN: [id number] DOB: 10/15/1955 Today's Date: 01/15/2020    History of present illness 64yo male c/o DOE and BLE edema, CXR with volume overload and R pleural effusion. Significant systolic impairment found on ECHO. Negative for PE. PMH anxiety, CA, DM, h/o suicide attempt, HTN, HLD, osteoporosis   OT comments  Pt making steady progress towards OT goals this session. Per RN, pt with recent fall out of chair earlier today. However pt now supine in bed agreeable to OT intervention.Pt continues to present with impaired cognition, impaired motor planning skills and impaired balance impacting pts ability to complete BADLs.  Session focus on functional mobility progression and BADL participation. Overall, pt requires MIN A with RW for household distance functional mobility needing cues to sequence functional gait progression and to manage RW. Pt completed standing UB grooming tasks at sink with min guard for balance however pt noted to be incontinent of urine during standing task needing assist to maneuver urinal. Pt currently requires MINA for UB dressing and MAX- MIN guard for LB ADLs from chair. Pt would continue to benefit from skilled occupational therapy while admitted and after d/c to address the below listed limitations in order to improve overall functional mobility and facilitate independence with BADL participation. DC plan remains appropriate, will follow acutely per POC.     Follow Up Recommendations  SNF;Supervision/Assistance - 24 hour    Equipment Recommendations  3 in 1 bedside commode;Tub/shower seat    Recommendations for Other Services      Precautions / Restrictions Precautions Precautions: Fall Precaution Comments: fell out of chair today 11/30 Restrictions Weight Bearing Restrictions: No       Mobility Bed Mobility Overal bed mobility: Modified Independent Bed Mobility: Supine to Sit;Sit to  Supine     Supine to sit: Modified independent (Device/Increase time);HOB elevated Sit to supine: Modified independent (Device/Increase time);HOB elevated   General bed mobility comments: no physical assist needed, increased time and use of rail and elevated HOB  Transfers Overall transfer level: Needs assistance Equipment used: Rolling walker (2 wheeled) Transfers: Sit to/from Stand Sit to Stand: Min assist         General transfer comment: cues for hand placement each trial; MIN A for initial steadying assist    Balance Overall balance assessment: Needs assistance Sitting-balance support: No upper extremity supported;Feet supported       Standing balance support: Single extremity supported;During functional activity Standing balance-[name]: Poor Standing balance comment: at least one UE supported during functional tasks at sink                           ADL either performed or assessed with clinical judgement   ADL Overall ADL's : Needs assistance/impaired     Grooming: Oral care;Sitting;Supervision/safety;Minimal assistance;Cueing for safety;Min guard Grooming Details (indicate cue type and reason): pt standing to complete oral care with supervision- minguard for standing balance with RW however pt with urgency to urinate noted to spit onto counter vs sink, likely d/t urgency but ? safety awarenress during ADLs     Lower Body Bathing: Min guard;Sitting/lateral leans Lower Body Bathing Details (indicate cue type and reason): pt able to bathe LB from chair at sink with minguard for balance when reaching out of BOS secondary to incontinent episode Upper Body Dressing : Minimal assistance;Sitting Upper Body Dressing Details (indicate cue type and reason): to don new gown from sitting at sink Lower Body  Dressing: Total assistance Lower Body Dressing Details (indicate cue type and reason): pt requested assist to don socks from chair Toilet Transfer: Minimal  assistance;RW;Ambulation Toilet Transfer Details (indicate cue type and reason): simulated via functional mobility, cues to sequence steps and manage RW during mobility         Functional mobility during ADLs: Minimal assistance;Rolling walker;Cueing for safety General ADL Comments: pt with improvements with functional mobility and no c/o dizziness this session     Vision       Perception     Praxis      Cognition Arousal/Alertness: Awake/alert Behavior During Therapy: WFL for tasks assessed/performed Overall Cognitive Status: Impaired/Different from baseline Area of Impairment: Safety/judgement;Problem solving;Awareness;Attention                   Current Attention Level: Focused     Safety/Judgement: Decreased awareness of safety;Decreased awareness of deficits (fell out of recliner prior to session) Awareness: Intellectual Problem Solving: Requires verbal cues;Slow processing General Comments: per RN pt fell out of recliner prior to session, noted to spit onto counter of sink vs spitting into sink        Exercises     Shoulder Instructions       General Comments pt with urinary incontience episode during grooming tasks while standing at sink    Pertinent Vitals/ Pain       Pain Assessment: Faces Faces Pain Scale: Hurts a little bit Pain Location: L ribs Pain Descriptors / Indicators: Aching;Guarding;Grimacing Pain Intervention(s): Monitored during session;Repositioned  Home Living                                          Prior Functioning/Environment              Frequency  Min 2X/week        Progress Toward Goals  OT Goals(current goals can now be found in the care plan section)  Progress towards OT goals: Progressing toward goals  Acute Rehab OT Goals Patient Stated Goal: to go home OT Goal Formulation: With patient Time For Goal Achievement: 01/23/20 Potential to Achieve Goals: Good  Plan Discharge plan remains  appropriate;Frequency remains appropriate    Co-evaluation                 AM-PAC OT "6 Clicks" Daily Activity     Outcome Measure   Help from another person eating meals?: None Help from another person taking care of personal grooming?: A Little Help from another person toileting, which includes using toliet, bedpan, or urinal?: A Little Help from another person bathing (including washing, rinsing, drying)?: A Little Help from another person to put on and taking off regular upper body clothing?: A Little Help from another person to put on and taking off regular lower body clothing?: A Lot 6 Click Score: 18    End of Session Equipment Utilized During Treatment: Gait belt;Rolling walker  OT Visit Diagnosis: Unsteadiness on feet (R26.81);Other abnormalities of gait and mobility (R26.89);Muscle weakness (generalized) (M62.81);Dizziness and giddiness (R42)   Activity Tolerance Patient tolerated treatment well   Patient Left in bed;with call bell/phone within reach;with bed alarm set;Other (comment) (bed in lowest position; safety mats positioned on floor)   Nurse Communication Mobility status;Other (comment) (resquested OJ)        Time: 3536-1443 OT Time Calculation (min): 20 min  Charges: OT General Charges $OT Visit:  1 Visit OT Treatments $Self Care/Home Management : 8-22 mins  Lanier Clam., COTA/L Acute Rehabilitation Services [phone] [place] 01/15/2020, 4:10 PM

## 2020-01-15 NOTE — Progress Notes (Signed)
Physical Therapy Treatment Patient Details Name: [name] MRN: [id number] DOB: 1955/07/05 Today's Date: 01/15/2020    History of Present Illness 64yo male c/o DOE and BLE edema, CXR with volume overload and R pleural effusion. Significant systolic impairment found on ECHO. Negative for PE. PMH anxiety, CA, DM, h/o suicide attempt, HTN, HLD, osteoporosis    PT Comments    Pt is progressing well with mobility. He ambulated 130' with RW, no loss of balance, HR 101 while walking, pt reported mild dizziness. Min assist for balance initially with sit to stand.   Follow Up Recommendations  SNF     Equipment Recommendations  Rolling walker with 5" wheels    Recommendations for Other Services       Precautions / Restrictions Precautions Precautions: Fall Restrictions Weight Bearing Restrictions: No    Mobility  Bed Mobility Overal bed mobility: Modified Independent       Supine to sit: Modified independent (Device/Increase time);HOB elevated     General bed mobility comments: used bedrail  Transfers Overall transfer level: Needs assistance Equipment used: Rolling walker (2 wheeled) Transfers: Sit to/from Stand Sit to Stand: Min assist;From elevated surface         General transfer comment: VCs hand placement, min A to power up and to steady 2* posterior bias  Ambulation/Gait Ambulation/Gait assistance: Min guard Gait Distance (Feet): 130 Feet Assistive device: Rolling walker (2 wheeled) Gait Pattern/deviations: Step-through pattern;Decreased stride length;Decreased step length - right;Decreased step length - left Gait velocity: decreased   General Gait Details: mild posterior lean initially in standing, pt ambulated 130' with RW, no loss of balance, HR 101 while walking, pt reported "a little dizziness", frequent VCs to step into frame of RW   Stairs             Wheelchair Mobility    Modified Rankin (Stroke Patients Only)       Balance  Overall balance assessment: Needs assistance Sitting-balance support: No upper extremity supported;Feet supported Sitting balance-[name]: Good     Standing balance support: Bilateral upper extremity supported Standing balance-[name]: Poor Standing balance comment: reliant on BUE support                            Cognition Arousal/Alertness: Awake/alert Behavior During Therapy: WFL for tasks assessed/performed Overall Cognitive Status: Within Functional Limits for tasks assessed                                        Exercises      General Comments        Pertinent Vitals/Pain Pain Assessment: No/denies pain    Home Living                      Prior Function            PT Goals (current goals can now be found in the care plan section) Acute Rehab PT Goals Patient Stated Goal: to go home PT Goal Formulation: With patient Time For Goal Achievement: 01/22/20 Potential to Achieve Goals: Fair Progress towards PT goals: Progressing toward goals    Frequency    Min 3X/week      PT Plan Current plan remains appropriate    Co-evaluation              AM-PAC PT "6 Clicks" Mobility  Outcome Measure  Help needed turning from your back to your side while in a flat bed without using bedrails?: None Help needed moving from lying on your back to sitting on the side of a flat bed without using bedrails?: A Little Help needed moving to and from a bed to a chair (including a wheelchair)?: A Little Help needed standing up from a chair using your arms (e.g., wheelchair or bedside chair)?: A Little Help needed to walk in hospital room?: A Little Help needed climbing 3-5 steps with a railing? : A Little 6 Click Score: 19    End of Session Equipment Utilized During Treatment: Gait belt Activity Tolerance: Patient tolerated treatment well Patient left: in chair;with call bell/phone within reach;with chair alarm set Nurse  Communication: Mobility status;Other (comment) (IV site bleeding) PT Visit Diagnosis: Unsteadiness on feet (R26.81);Muscle weakness (generalized) (M62.81);Difficulty in walking, not elsewhere classified (R26.2)     Time: 5672-0919 PT Time Calculation (min) (ACUTE ONLY): 24 min  Charges:  $Gait Training: 8-22 mins $Therapeutic Activity: 8-22 mins                     Blondell Reveal Kistler PT 01/15/2020  Acute Rehabilitation Services Pager [phone] Office [phone]

## 2020-01-15 NOTE — Progress Notes (Signed)
Inpatient Diabetes Program Recommendations  AACE/ADA: New Consensus Statement on Inpatient Glycemic Control (2015)  Target Ranges:  Prepandial:   less than 140 mg/dL      Peak postprandial:   less than 180 mg/dL (1-2 hours)      Critically ill patients:  140 - 180 mg/dL   Results for MAYSON, MCNEISH (MRN [id number]) as of 01/15/2020 12:05  Ref. Range 01/13/2020 06:28 01/13/2020 11:53 01/13/2020 16:27 01/13/2020 17:13 01/13/2020 21:53  Glucose-Capillary Latest Ref Range: 70 - 99 mg/dL 99 393 (H)  17 units NOVOLOG  53 (L) 88 242 (H)    15 units LANTUS @11 :20pm   Results for LADARIAN, BONCZEK (MRN [id number]) as of 01/15/2020 12:05  Ref. Range 01/14/2020 08:23 01/14/2020 12:56 01/14/2020 15:52 01/14/2020 20:46  Glucose-Capillary Latest Ref Range: 70 - 99 mg/dL 162 (H)  9 units NOVOLOG  201 (H)  11 units NOVOLOG  71 282 (H)    15 units LANTUS @9 :46pm   Results for KOLSON, CHOVANEC (MRN [id number]) as of 01/15/2020 12:05  Ref. Range 01/15/2020 08:15 01/15/2020 11:49  Glucose-Capillary Latest Ref Range: 70 - 99 mg/dL 135 (H)  8 units NOVOLOG  224 (H)   Home DM meds: Glipizide 10 mg BID        Lantus 50 units QHS        Metformin (NOT taking)   Current Orders: Lantus 15 units QHS      Novolog 0-15 units TID       Novolog 6 units TID with meals      MD- May consider slight increase of Novolog Meal Coverage to 8 units TID with meals    --Will follow patient during hospitalization--  Wyn Quaker RN, MSN, CDE Diabetes Coordinator Inpatient Glycemic Control Team Team Pager: [phone] (8a-5p)

## 2020-01-16 DIAGNOSIS — I5020 Unspecified systolic (congestive) heart failure: Secondary | ICD-10-CM

## 2020-01-16 LAB — BASIC METABOLIC PANEL
Anion gap: 11 (ref 5–15)
BUN: 17 mg/dL (ref 8–23)
CO2: 23 mmol/L (ref 22–32)
Calcium: 9.4 mg/dL (ref 8.9–10.3)
Chloride: 104 mmol/L (ref 98–111)
Creatinine, Ser: 1.34 mg/dL — ABNORMAL HIGH (ref 0.61–1.24)
GFR, Estimated: 60 mL/min — ABNORMAL LOW (ref >60)
Glucose, Bld: 191 mg/dL — ABNORMAL HIGH (ref 70–99)
Potassium: 4.4 mmol/L (ref 3.5–5.1)
Sodium: 138 mmol/L (ref 135–145)

## 2020-01-16 LAB — GLUCOSE, CAPILLARY
Glucose-Capillary: 189 mg/dL — ABNORMAL HIGH (ref 70–99)
Glucose-Capillary: 222 mg/dL — ABNORMAL HIGH (ref 70–99)
Glucose-Capillary: 196 mg/dL — ABNORMAL HIGH (ref 70–99)
Glucose-Capillary: 135 mg/dL — ABNORMAL HIGH (ref 70–99)
Glucose-Capillary: 120 mg/dL — ABNORMAL HIGH (ref 70–99)

## 2020-01-16 MED ORDER — INSULIN ASPART 100 UNIT/ML ~~LOC~~ SOLN
8.0000 [IU] | Freq: Three times a day (TID) | SUBCUTANEOUS | Status: DC
  Administered 2020-01-16 – 2020-01-17 (×3): 8 [IU] via SUBCUTANEOUS

## 2020-01-16 NOTE — Progress Notes (Addendum)
Physical Therapy Treatment Patient Details Name: [name] MRN: [id number] DOB: 1955-12-19 Today's Date: 01/16/2020    History of Present Illness 64yo male c/o DOE and BLE edema, CXR with volume overload and R pleural effusion. Significant systolic impairment found on ECHO. Negative for PE. PMH anxiety, CA, DM, h/o suicide attempt, HTN, HLD, osteoporosis    PT Comments    Pt progressing with mobility, he ambulated 200' with RW with mild loss of balance x 1, pt able to self correct. HR 115 walking. Instructed pt in BUE/LE strengthening exercises to be done independently. Pt reported he fell while getting out of the recliner yesterday, he stated he called for help but his call bell didn't work.   Follow Up Recommendations  SNF     Equipment Recommendations  Rolling walker with 5" wheels    Recommendations for Other Services       Precautions / Restrictions Precautions Precautions: Fall Precaution Comments: fell out of chair 11/30 (call bell didn't work per pt) Restrictions Weight Bearing Restrictions: No    Mobility  Bed Mobility Overal bed mobility: Modified Independent Bed Mobility: Supine to Sit     Supine to sit: Modified independent (Device/Increase time);HOB elevated     General bed mobility comments: used bedrail  Transfers Overall transfer level: Needs assistance Equipment used: Rolling walker (2 wheeled) Transfers: Sit to/from Stand Sit to Stand: From elevated surface;Min guard         General transfer comment: VCs hand placement and to scoot to edge of bed, no loss of balance  Ambulation/Gait Ambulation/Gait assistance: Min guard Gait Distance (Feet): 200 Feet Assistive device: Rolling walker (2 wheeled) Gait Pattern/deviations: Step-through pattern;Decreased stride length;Decreased step length - right;Decreased step length - left Gait velocity: decreased   General Gait Details: HR 115 while walking, mild Loss of balance x 1 pt able to self  correct, frequent VCs to increase step length with minimal response, slow cadence   Stairs             Wheelchair Mobility    Modified Rankin (Stroke Patients Only)       Balance Overall balance assessment: Needs assistance Sitting-balance support: No upper extremity supported;Feet supported Sitting balance-[name]: Good     Standing balance support: Bilateral upper extremity supported Standing balance-[name]: Poor Standing balance comment: reliant on BUE support                            Cognition Arousal/Alertness: Awake/alert Behavior During Therapy: WFL for tasks assessed/performed Overall Cognitive Status: Impaired/Different from baseline Area of Impairment: Safety/judgement;Problem solving;Awareness;Attention                   Current Attention Level: Focused     Safety/Judgement: Decreased awareness of safety;Decreased awareness of deficits (fell out of recliner prior to session) Awareness: Intellectual Problem Solving: Requires verbal cues;Slow processing General Comments: fell yesterday attempting to get out of recliner, per pt his call bell wasn't working      Exercises General Exercises - Lower Extremity Ankle Circles/Pumps: AROM;Both;15 reps;Seated Long Arc Quad: AROM;Both;15 reps;Seated Hip Flexion/Marching: AROM;Both;15 reps;Seated Shoulder Exercises Shoulder Flexion: AROM;Both;10 reps;Seated    General Comments        Pertinent Vitals/Pain Pain Assessment: No/denies pain    Home Living                      Prior Function  PT Goals (current goals can now be found in the care plan section) Acute Rehab PT Goals Patient Stated Goal: agreeable to rehab PT Goal Formulation: With patient Time For Goal Achievement: 01/22/20 Potential to Achieve Goals: Good Progress towards PT goals: Progressing toward goals    Frequency    Min 3X/week      PT Plan Current plan remains appropriate     Co-evaluation              AM-PAC PT "6 Clicks" Mobility   Outcome Measure  Help needed turning from your back to your side while in a flat bed without using bedrails?: None Help needed moving from lying on your back to sitting on the side of a flat bed without using bedrails?: A Little Help needed moving to and from a bed to a chair (including a wheelchair)?: A Little Help needed standing up from a chair using your arms (e.g., wheelchair or bedside chair)?: A Little Help needed to walk in hospital room?: A Little Help needed climbing 3-5 steps with a railing? : A Little 6 Click Score: 19    End of Session Equipment Utilized During Treatment: Gait belt Activity Tolerance: Patient tolerated treatment well Patient left: with call bell/phone within reach;in bed;with bed alarm set Nurse Communication: Mobility status PT Visit Diagnosis: Unsteadiness on feet (R26.81);Muscle weakness (generalized) (M62.81);Difficulty in walking, not elsewhere classified (R26.2)     Time: 3662-9476 PT Time Calculation (min) (ACUTE ONLY): 21 min  Charges:  $Gait Training: 8-22 mins                     Blondell Reveal Kistler PT 01/16/2020  Acute Rehabilitation Services Pager [phone] Office [phone]

## 2020-01-16 NOTE — TOC Progression Note (Addendum)
Transition of Care [facility]) - Progression Note    Patient Details  Name: Lennis Korb MRN: [id number] Date of Birth: 10-11-1955  Transition of [facility] CM/SW Contact  Joanne Chars, LCSW Phone Number: 01/16/2020, 1:55 PM  Clinical Narrative:   [facility] SN[facility] faxed to [facility].  1350: phone call from Celedonio Savage, [place] [phone], pt has been approved for 32 days SNF on the contract list.  CSW contacted following [facility] contracted SNF: [place]: LM, [place]: faxed referral, [place]: faxed referral, [facility]: faxed referral    Expected Discharge Plan: Home/Self Care Barriers to Discharge: Continued Medical Work up  Expected Discharge Plan and Services Expected Discharge Plan: Home/Self Care In-house Referral: NA Discharge Planning Services: CM Consult   Living arrangements for the past 2 months: Single Family Home                 DME Arranged: N/A DME Agency: NA       HH Arranged: RN, PT, OT, Nurse's Aide [place] Agency: [place] ([facility]) Date HH Agency Contacted: 01/09/20 Time [place]: 1508 Representative spoke with at [place]: [place] ([place]) Interventions    Readmission Risk Interventions No flowsheet data found.

## 2020-01-16 NOTE — Progress Notes (Signed)
[facility] will sign off.   Medication Recommendations:  Continue metop XL 12.5mg  daily and lasix 20mg  daily at discharge Other recommendations (labs, testing, etc):  Repeat BMET at follow-up appointment with Cardiology Follow up as an outpatient:  Will arrange   Gwyndolyn Kaufman, MD

## 2020-01-16 NOTE — NC FL2 (Signed)
[place] LEVEL OF CARE SCREENING TOOL     IDENTIFICATION  Patient Name: [name] Birthdate: [birth year] Sex: male Admission Date (Current Location): 01/04/2020  [place] and [place] Number:  Kathleen Argue [id number] [place] and Address:  [facility] [place], [place] [address], [place], [place] [address]      Provider Number: [id number]  Attending Physician Name and Address:  Lucious Groves, DO  Relative Name and Phone Number:  Levell, [place] Father [phone]    Current Level of Care: Hospital Recommended Level of Care: [place] Prior Approval Number:    Date Approved/Denied:   PASRR Number:    Discharge Plan: SNF    Current Diagnoses: Patient Active Problem List   Diagnosis Date Noted  . Pleural effusion due to congestive heart failure ([place]) 01/11/2020  . Aortic atherosclerosis ([place]) 01/11/2020  . Coronary artery disease 01/11/2020  . 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  . Orthostatic hypotension 01/10/2020  . HFrEF (heart failure with reduced ejection fraction) ([place]) 01/06/2020  . Tobacco use disorder 01/06/2020  . Obesity 01/06/2020  . Bilateral lower extremity edema 01/04/2020  . Shortness of breath 01/04/2020  . MDD (major depressive disorder), recurrent severe, without psychosis ([place]) 10/09/2016  . Diabetes type 2, controlled ([place]) 01/01/2015  . Erectile dysfunction 01/01/2015  . Healthcare maintenance 01/01/2015  . Bipolar 1 disorder, depressed ([place]) 07/23/2013  . Bipolar disorder ([place]) 04/03/2013  . MDD (major depressive disorder), recurrent, severe, with psychosis ([place]) 11/04/2012  . Suicidal ideation 11/04/2012  . Post traumatic stress disorder (PTSD) 11/04/2012  . CARCINOMA, RENAL CELL 06/05/2008  . HLD (hyperlipidemia) 06/05/2008  . Essential hypertension 06/05/2008  . CHEST PAIN 06/05/2008    Orientation RESPIRATION BLADDER Height & Weight     Self, Time, Situation, Place  Normal Continent Weight: 158 lb  15.2 oz (72.1 kg) Height:  5\' 10"  (177.8 cm)  BEHAVIORAL SYMPTOMS/MOOD NEUROLOGICAL BOWEL NUTRITION STATUS      Continent Diet (Heart diet.  See discharge summary.)  AMBULATORY STATUS COMMUNICATION OF NEEDS Skin   Independent Verbally Normal                       Personal Care Assistance Level of Assistance  Bathing, Feeding, Dressing Bathing Assistance: Independent Feeding assistance: Independent Dressing Assistance: Independent     Functional Limitations Info  Sight, Hearing, Speech Sight Info: Adequate Hearing Info: Adequate Speech Info: Adequate    SPECIAL CARE FACTORS FREQUENCY  PT (By licensed PT), OT (By licensed OT)     PT Frequency: 5x week OT Frequency: 5x week            Contractures Contractures Info: Not present    Additional Factors Info  Insulin Sliding Scale, Code Status, Allergies Code Status Info: full Allergies Info: morphine and related   Insulin Sliding Scale Info: novolog 0-15 units, 3x day with meals, novolog 6 units, 3x day with meals       Current Medications (01/16/2020):  This is the current hospital active medication list Current Facility-Administered Medications  Medication Dose Route Frequency Provider Last Rate Last Admin  . acetaminophen (TYLENOL) tablet 650 mg  650 mg Oral Q6H PRN [name], DO   650 mg at 01/11/20 6803   Or  . acetaminophen (TYLENOL) suppository 650 mg  650 mg Rectal Q6H PRN [name], DO      . albuterol (PROVENTIL) (2.5 MG/3ML) 0.083% nebulizer solution 2.5 mg  2.5 mg Nebulization Q2H PRN [name], DO      .  ARIPiprazole (ABILIFY) tablet 10 mg  10 mg Oral Daily [name], DO   10 mg at 01/16/20 1257  . atorvastatin (LIPITOR) tablet 40 mg  40 mg Oral Daily [name], DO   40 mg at 01/16/20 0841  . docusate sodium (COLACE) capsule 100 mg  100 mg Oral BID [name], DO   100 mg at 01/16/20 0840  . DULoxetine (CYMBALTA) DR capsule 20 mg  20 mg Oral Daily [name], DO   20 mg at 01/16/20  0840  . enoxaparin (LOVENOX) injection 40 mg  40 mg Subcutaneous Q24H [name], DO   40 mg at 01/15/20 1729  . furosemide (LASIX) tablet 20 mg  20 mg Oral Daily Freada Bergeron, MD   20 mg at 01/16/20 0841  . gabapentin (NEURONTIN) capsule 600 mg  600 mg Oral Daily PRN [name], DO      . insulin aspart (novoLOG) injection 0-15 Units  0-15 Units Subcutaneous TID WC [name], DO   3 Units at 01/16/20 1257  . insulin aspart (novoLOG) injection 6 Units  6 Units Subcutaneous TID WC [name], DO   6 Units at 01/16/20 1256  . insulin glargine (LANTUS) injection 15 Units  15 Units Subcutaneous QHS [name], DO   15 Units at 01/15/20 2241  . lamoTRIgine (LAMICTAL) tablet 200 mg  200 mg Oral BID [name], DO   200 mg at 01/16/20 0841  . lidocaine (LIDODERM) 5 % 1 patch  1 patch Transdermal Q24H [name], DO   1 patch at 01/14/20 1425  . metoprolol succinate (TOPROL-XL) 24 hr tablet 12.5 mg  12.5 mg Oral Daily Freada Bergeron, MD   12.5 mg at 01/16/20 0840  . nicotine polacrilex (NICORETTE) gum 2 mg  2 mg Oral Q2H PRN [name], DO   2 mg at 01/09/20 0926  . pantoprazole (PROTONIX) EC tablet 40 mg  40 mg Oral Daily Freada Bergeron, MD   40 mg at 01/16/20 0841  . senna (SENOKOT) tablet 8.6 mg  1 tablet Oral BID [name], DO   8.6 mg at 01/16/20 0932     Discharge Medications: Please see discharge summary for a list of discharge medications.  Relevant Imaging Results:  Relevant Lab Results:   Additional Information SSN [id number]  Joanne Chars, LCSW

## 2020-01-16 NOTE — Plan of Care (Signed)
  Nutrition Education Note  RD consulted for nutrition education regarding new onset CHF.  RD provided "Low Sodium Nutrition Therapy" handout from the Academy of [employer] Dietetics. Reviewed patient's dietary recall. Patient typical intake: Breakfast: eggs, toast, Lunch: deli sandwich and fries, Dinner: meat, starch, vegetable plate. Provided examples on ways to decrease sodium intake in diet. Encouraged choosing low-sodium items when grocery shopping and when dining out. Discouraged intake of processed foods and use of salt shaker. Encouraged the use of salt-free seasoning for flavoring foods and provided examples such as "Salt-free Mrs.Dash".   RD discussed why it is important for patient to adhere to diet recommendations, and emphasized the role of fluids, foods to avoid, and importance of weighing self daily. RD discussed pathophysiology of how sodium can lead to fluid accumulation and why it is important to monitor sodium. Teach back method used.  Expect good compliance.   Current diet order is heart healthy, patient is consuming approximately 100% of meals at this time. Labs and medications reviewed. No further nutrition interventions warranted at this time. RD contact information provided. If additional nutrition issues arise, please re-consult RD.    Ronnald Nian, Dietetic Intern Pager: [phone] If unavailable: [phone]     .

## 2020-01-16 NOTE — Progress Notes (Signed)
HD#12 Subjective:  Overnight Events: NAEOV  States his legs are getting better but he still does not have the strength to walk on his own and often needs assistance. States stomach is okay and last BM was last night.The dizziness is "good" and "it's getting better" but sometimes he feels dizzy when he does PT. States he fell yesterday while moving from the chair to the bed by himself. He did not hit his head but hit his arm.   Objective:  Vital signs in last 24 hours: Vitals:   01/15/20 1309 01/15/20 2147 01/16/20 0527 01/16/20 0536  BP: (!) 131/98 110/75 107/64   Pulse: 92 100 96   Resp: 20 20 18    Temp: 97.9 F (36.6 C) 98.2 F (36.8 C) 98.1 F (36.7 C)   TempSrc: Oral Oral Oral   SpO2: 100% 97% 97%   Weight:    72.1 kg  Height:       Supplemental O2: Room Air SpO2: 97 %   Physical Exam:  Physical Exam Constitutional:      General: He is not in acute distress.    Appearance: He is normal weight.  Cardiovascular:     Rate and Rhythm: Normal rate and regular rhythm.     Pulses: Normal pulses.     Heart sounds: Normal heart sounds.     Comments: Trace pretibial edema bilaterally Pulmonary:     Effort: Pulmonary effort is normal.     Breath sounds: Normal breath sounds.  Abdominal:     General: Abdomen is flat. Bowel sounds are normal.     Palpations: Abdomen is soft.  Musculoskeletal:     Cervical back: Normal range of motion and neck supple.  Skin:    Capillary Refill: Capillary refill takes less than 2 seconds.  Neurological:     General: No focal deficit present.     Mental Status: He is alert. Mental status is at baseline.     Filed Weights   01/14/20 2207 01/15/20 0104 01/16/20 0536  Weight: 83 kg 83 kg 72.1 kg     Intake/Output Summary (Last 24 hours) at 01/16/2020 0550 Last data filed at 01/16/2020 0528 Gross per 24 hour  Intake 600 ml  Output 1175 ml  Net -575 ml   Net IO Since Admission: -4,943 mL [01/16/20 0550]  Pertinent Labs: CBC  Latest Ref Rng & Units 01/07/2020 01/06/2020 01/05/2020  WBC 4.0 - 10.5 K/uL 4.1 4.3 5.6  Hemoglobin 13.0 - 17.0 g/dL 12.7(L) 12.7(L) 13.8  Hematocrit 39 - 52 % 40.9 40.2 43.9  Platelets 150 - 400 K/uL 176 181 214    CMP Latest Ref Rng & Units 01/16/2020 01/15/2020 01/14/2020  Glucose 70 - 99 mg/dL 191(H) 116(H) 125(H)  BUN 8 - 23 mg/dL 17 19 20   Creatinine 0.61 - 1.24 mg/dL 1.34(H) 1.41(H) 1.41(H)  Sodium 135 - 145 mmol/L 138 138 137  Potassium 3.5 - 5.1 mmol/L 4.4 4.1 4.3  Chloride 98 - 111 mmol/L 104 103 102  CO2 22 - 32 mmol/L 23 24 23   Calcium 8.9 - 10.3 mg/dL 9.4 9.3 9.3  Total Protein 6.5 - 8.1 g/dL - - -  Total Bilirubin 0.3 - 1.2 mg/dL - - -  Alkaline Phos 38 - 126 U/L - - -  AST 15 - 41 U/L - - -  ALT 0 - 44 U/L - - -    Imaging: No results found.  Assessment/Plan:   Principal Problem:   HFrEF (heart failure with reduced  ejection fraction) (HCC) Active Problems:   HLD (hyperlipidemia)   Essential hypertension   Diabetes type 2, controlled (HCC)   Bilateral lower extremity edema   Shortness of breath   Tobacco use disorder   Obesity   Orthostatic hypotension   Pleural effusion due to congestive heart failure (HCC)   Aortic atherosclerosis (HCC)   Coronary artery disease   Hepatic cyst   Patient Summary: Kevin Hubbardis a 64 year old male with past medical history ofhypertension, type 2 diabetes on insulin, major depressive disorder, bipolar disorder, PTSD, renal cell carcinomas/pnephrectomy, chronic knee pain whoadmittedhospital for bilateral lower extremity swelling and shortness of breath,found to have a large right pleural effusiondue to acute onset of HFrEF.  His dizziness is improving.  Principal Problem:   HFrEF (heart failure with reduced ejection fraction) (HCC) Active Problems:   Orthostatic hypotension   HLD (hyperlipidemia)   Essential hypertension   Diabetes type 2, controlled (HCC)   Bilateral lower extremity edema    Shortness of breath   Tobacco use disorder   Obesity   Pleural effusion due to congestive heart failure (HCC)   Aortic atherosclerosis (HCC)   Coronary artery disease   Hepatic cyst  Acute heart failure with reduced EF(25-30%) (newly diagnosed) Transudative right pleural effusion s/p thoracentesis (11/20). CAD Aortic atherosclosis Chronic hypertension and hyperlipidemia  -Appreciate cardiology recommendation -Holding Entresto -Continue Lipitor. -Continue metoprolol succinate 12.5 mg daily -Resume Lasix 20 mg daily -No MRAgiven AKI Per cardiology -Outpatient ischemic work-up with cardiology -Dry weight 183lbs  Dizziness Orthostatic hypotension Dizziness is slowly improving with current medication changes. Social worker is is working with the [employer] and has his paperwork submitted today. -Pending SNF placement - Continue PT   UncontrolledType 2 diabetes. Fasting CBG 191.  -Lantus 15 units daily. -IncreaseNovologto 8Units TID -Sliding scale insulin -Monitor CBG -Consider SGLT2   Liver cyst -Follow up outpatient  Major depressive disorder Bipolar disorder PTSD Continue abilify, duloxetine, lamictal, trazodone  Smoking Patient smoked 2 cigarettes a day for more than 10 years -Nicotine gum 2 mg -encourage cessation -Albuterol PRN  Diet: Heart diet IVF: N/A VTE: Lovenox CODE: Full  Prior to Admission Living Arrangement: Home Anticipated Discharge Location:SNF Barriers to Discharge:SNF placement  [name], MD 01/16/2020, 5:50 AM Pager: [phone]  Please contact the on call pager after 5 pm and on weekends at [phone].

## 2020-01-17 LAB — GLUCOSE, CAPILLARY
Glucose-Capillary: 126 mg/dL — ABNORMAL HIGH (ref 70–99)
Glucose-Capillary: 133 mg/dL — ABNORMAL HIGH (ref 70–99)
Glucose-Capillary: 142 mg/dL — ABNORMAL HIGH (ref 70–99)
Glucose-Capillary: 196 mg/dL — ABNORMAL HIGH (ref 70–99)
Glucose-Capillary: 302 mg/dL — ABNORMAL HIGH (ref 70–99)
Glucose-Capillary: 87 mg/dL (ref 70–99)

## 2020-01-17 LAB — CBC
HCT: 46.2 % (ref 39.0–52.0)
Hemoglobin: 14.4 g/dL (ref 13.0–17.0)
MCH: 25.5 pg — ABNORMAL LOW (ref 26.0–34.0)
MCHC: 31.2 g/dL (ref 30.0–36.0)
MCV: 81.9 fL (ref 80.0–100.0)
Platelets: 192 10*3/uL (ref 150–400)
RBC: 5.64 MIL/uL (ref 4.22–5.81)
RDW: 14.1 % (ref 11.5–15.5)
WBC: 4.9 10*3/uL (ref 4.0–10.5)
nRBC: 0 % (ref 0.0–0.2)

## 2020-01-17 LAB — BASIC METABOLIC PANEL
Anion gap: 9 (ref 5–15)
BUN: 17 mg/dL (ref 8–23)
CO2: 25 mmol/L (ref 22–32)
Calcium: 9.5 mg/dL (ref 8.9–10.3)
Chloride: 103 mmol/L (ref 98–111)
Creatinine, Ser: 1.26 mg/dL — ABNORMAL HIGH (ref 0.61–1.24)
GFR, Estimated: >60 mL/min (ref >60)
Glucose, Bld: 142 mg/dL — ABNORMAL HIGH (ref 70–99)
Potassium: 4.3 mmol/L (ref 3.5–5.1)
Sodium: 137 mmol/L (ref 135–145)

## 2020-01-17 MED ORDER — INSULIN GLARGINE 100 UNIT/ML ~~LOC~~ SOLN
25.0000 [IU] | Freq: Every day | SUBCUTANEOUS | Status: DC
  Administered 2020-01-17 – 2020-01-20 (×4): 25 [IU] via SUBCUTANEOUS
  Filled 2020-01-17 (×6): qty 0.25

## 2020-01-17 MED ORDER — INSULIN ASPART 100 UNIT/ML ~~LOC~~ SOLN
5.0000 [IU] | Freq: Three times a day (TID) | SUBCUTANEOUS | Status: DC
  Administered 2020-01-17 – 2020-01-20 (×10): 5 [IU] via SUBCUTANEOUS

## 2020-01-17 NOTE — TOC Progression Note (Signed)
Transition of Care [facility]) - Progression Note    Patient Details  Name: Dare Sanger MRN: [id number] Date of Birth: [birth year]  Transition [facility]) CM/SW Contact  Joanne Chars, LCSW Phone Number: 01/17/2020, 2:30 PM  Clinical Narrative:  [facility] both cannot offer beds.  [facility] Full.  Still waiting for response from [facility] and [facility].  Level 2 passr info uploaded.      Expected Discharge Plan: Home/Self Care Barriers to Discharge: Continued Medical Work up  Expected Discharge Plan and Services Expected Discharge Plan: Home/Self Care In-house Referral: NA Discharge Planning Services: CM Consult   Living arrangements for the past 2 months: Single Family Home                 DME Arranged: N/A DME Agency: NA       HH Arranged: RN, PT, OT, Nurse's Aide [place] Agency: [place] ([facility]) Date HH Agency Contacted: 01/09/20 Time [place]: 1508 Representative spoke with at [place]: [place] ([place]) Interventions    Readmission Risk Interventions No flowsheet data found.

## 2020-01-17 NOTE — Progress Notes (Addendum)
HD#13 Subjective:  Overnight Events: NAEOV  Patient states the dizziness is a lot better. His SOB has improved but states he has it occasionally.  He walked multiple times through the hallway without any issues. States the SW came by and told him they are working on finding him a bed. States he is pretty stable and not as depressed. He has a positive outlook and knows he is making progress.   Objective:  Vital signs in last 24 hours: Vitals:   01/15/20 2147 01/16/20 0527 01/16/20 0536 01/16/20 2143  BP: 110/75 107/64  118/69  Pulse: 100 96  99  Resp: 20 18  16   Temp: 98.2 F (36.8 C) 98.1 F (36.7 C)  98.6 F (37 C)  TempSrc: Oral Oral  Oral  SpO2: 97% 97%  100%  Weight:   72.1 kg   Height:       Supplemental O2: Room Air SpO2: 100 %   Physical Exam:  Physical Exam Constitutional:      General: He is not in acute distress.    Appearance: He is normal weight.  Cardiovascular:     Rate and Rhythm: Normal rate and regular rhythm.     Pulses: Normal pulses.     Heart sounds: Normal heart sounds.     Comments: Trace pretibial edema bilaterally Pulmonary:     Effort: Pulmonary effort is normal.     Breath sounds: Normal breath sounds.  Abdominal:     General: Abdomen is flat. Bowel sounds are normal.     Palpations: Abdomen is soft.  Musculoskeletal:     Cervical back: Normal range of motion and neck supple.  Skin:    Capillary Refill: Capillary refill takes less than 2 seconds.  Neurological:     General: No focal deficit present.     Mental Status: He is alert. Mental status is at baseline.     Filed Weights   01/14/20 2207 01/15/20 0104 01/16/20 0536  Weight: 83 kg 83 kg 72.1 kg     Intake/Output Summary (Last 24 hours) at 01/17/2020 0515 Last data filed at 01/16/2020 2020 Gross per 24 hour  Intake 240 ml  Output 1225 ml  Net -985 ml   Net IO Since Admission: -5,028 mL [01/17/20 0515]  Pertinent Labs: CBC Latest Ref Rng & Units 01/17/2020 01/07/2020  01/06/2020  WBC 4.0 - 10.5 K/uL 4.9 4.1 4.3  Hemoglobin 13.0 - 17.0 g/dL 14.4 12.7(L) 12.7(L)  Hematocrit 39 - 52 % 46.2 40.9 40.2  Platelets 150 - 400 K/uL 192 176 181    CMP Latest Ref Rng & Units 01/17/2020 01/16/2020 01/15/2020  Glucose 70 - 99 mg/dL 142(H) 191(H) 116(H)  BUN 8 - 23 mg/dL 17 17 19   Creatinine 0.61 - 1.24 mg/dL 1.26(H) 1.34(H) 1.41(H)  Sodium 135 - 145 mmol/L 137 138 138  Potassium 3.5 - 5.1 mmol/L 4.3 4.4 4.1  Chloride 98 - 111 mmol/L 103 104 103  CO2 22 - 32 mmol/L 25 23 24   Calcium 8.9 - 10.3 mg/dL 9.5 9.4 9.3  Total Protein 6.5 - 8.1 g/dL - - -  Total Bilirubin 0.3 - 1.2 mg/dL - - -  Alkaline Phos 38 - 126 U/L - - -  AST 15 - 41 U/L - - -  ALT 0 - 44 U/L - - -    Imaging: No results found.  Assessment/Plan:   Principal Problem:   HFrEF (heart failure with reduced ejection fraction) (HCC) Active Problems:   HLD (hyperlipidemia)  Essential hypertension   Diabetes type 2, controlled (HCC)   Bilateral lower extremity edema   Shortness of breath   Tobacco use disorder   Obesity   Orthostatic hypotension   Pleural effusion due to congestive heart failure (HCC)   Aortic atherosclerosis (HCC)   Coronary artery disease   Hepatic cyst   Patient Summary: [name] is a 64 year old male with past medical history of hypertension, type 2 diabetes on insulin, major depressive disorder, bipolar disorder, PTSD, renal cell carcinoma s/p nephrectomy, chronic knee pain who admitted hospital for bilateral lower extremity swelling and shortness of breath, found to have a large right pleural effusion due to acute onset of HFrEF.    His dizziness is improving.   Principal Problem:   HFrEF (heart failure with reduced ejection fraction) (HCC) Active Problems:   Orthostatic hypotension   HLD (hyperlipidemia)   Essential hypertension   Diabetes type 2, controlled (HCC)   Bilateral lower extremity edema   Shortness of breath   Tobacco use disorder    Obesity   Pleural effusion due to congestive heart failure (HCC)   Aortic atherosclerosis (HCC)   Coronary artery disease   Hepatic cyst   Acute heart failure with reduced EF (25-30%) (newly diagnosed) Transudative right pleural effusion s/p thoracentesis (11/20).  CAD Aortic atherosclosis Chronic hypertension and hyperlipidemia -Continue Lipitor. -Continue metoprolol succinate 12.5 mg daily -Continue Lasix 20 mg daily -No MRA given AKI Per cardiology -Outpatient ischemic work-up with cardiology -Dry weight 183lbs - PT/OT: Pending placement at SNF   Dizziness Orthostatic hypotension Dizziness much improved not falls doing well with PT. SW continuing to work on placement -Pending SNF placement -Continue PT   Uncontrolled Type 2 diabetes with Hyperglycemia Fasting CBG 126.  -Lantus 15 units daily, Novolog to 8 Units TID -Sliding scale insulin -Monitor CBG -Consider SGLT2     Liver cyst -Follow up outpatient   Major depressive disorder Bipolar disorder PTSD Continue abilify, duloxetine, lamictal, trazodone   Smoking Patient smoked 2 cigarettes a day for more than 10 years -Nicotine gum 2 mg -Encourage cessation -Albuterol PRN   Diet: Heart diet IVF: N/A VTE: Lovenox CODE: Full   Prior to Admission Living Arrangement: Home Anticipated Discharge Location: SNF Barriers to Discharge: SNF placement  [name], MD 01/17/2020, 5:15 AM Pager: [phone]  Please contact the on call pager after 5 pm and on weekends at [phone].

## 2020-01-18 DIAGNOSIS — R030 Elevated blood-pressure reading, without diagnosis of hypertension: Secondary | ICD-10-CM

## 2020-01-18 LAB — GLUCOSE, CAPILLARY
Glucose-Capillary: 122 mg/dL — ABNORMAL HIGH (ref 70–99)
Glucose-Capillary: 147 mg/dL — ABNORMAL HIGH (ref 70–99)
Glucose-Capillary: 183 mg/dL — ABNORMAL HIGH (ref 70–99)
Glucose-Capillary: 204 mg/dL — ABNORMAL HIGH (ref 70–99)
Glucose-Capillary: 215 mg/dL — ABNORMAL HIGH (ref 70–99)
Glucose-Capillary: 254 mg/dL — ABNORMAL HIGH (ref 70–99)
Glucose-Capillary: 303 mg/dL — ABNORMAL HIGH (ref 70–99)

## 2020-01-18 LAB — CREATININE, SERUM
Creatinine, Ser: 1.19 mg/dL (ref 0.61–1.24)
GFR, Estimated: >60 mL/min (ref >60)

## 2020-01-18 NOTE — Progress Notes (Addendum)
Occupational Therapy Treatment Patient Details Name: [name] MRN: [id number] DOB: 27-Apr-1955 Today's Date: 01/18/2020    History of present illness 64yo male c/o DOE and BLE edema, CXR with volume overload and R pleural effusion. Significant systolic impairment found on ECHO. Negative for PE. PMH anxiety, CA, DM, h/o suicide attempt, HTN, HLD, osteoporosis   OT comments  Pt. Seen for skilled OT treatment.  Pt. Able to complete bed mobility mod I.  In room ambulation for toileting and grooming task. Min a.  Cues for sequencing and safety with rw management and transfers.  Will continue with adls next session with integration of fall prevention and energy conservation strategies prn.    Follow Up Recommendations  SNF;Supervision/Assistance - 24 hour    Equipment Recommendations  3 in 1 bedside commode;Tub/shower seat    Recommendations for Other Services      Precautions / Restrictions Precautions Precautions: Fall Precaution Comments: fell out of chair 11/30 (call bell didn't work per pt)       Mobility Bed Mobility Overal bed mobility: Modified Independent                Transfers Overall transfer level: Needs assistance Equipment used: Rolling walker (2 wheeled) Transfers: Sit to/from Stand Sit to Stand: Supervision         General transfer comment: vcs for hand placement when transitioning from sit/stand and stand/sit.  states "the other therapist" taught him to hold the center of rw with one hand and place other hand on the hand portion of the rw.  then attempted to ambuate with his hand still in the center of the rw vs. moving it to the other hand portion    Balance                                           ADL either performed or assessed with clinical judgement   ADL Overall ADL's : Needs assistance/impaired     Grooming: Wash/dry hands;Min guard;Cueing for sequencing;Cueing for safety Grooming Details (indicate cue type and  reason): cues for RW management at the sink. LB Dressing:  Able to don and adjust TED hose and socks with min guard a.                   Toilet Transfer: Minimal assistance;RW;Ambulation   Toileting- Clothing Manipulation and Hygiene: Supervision/safety;Sitting/lateral lean       Functional mobility during ADLs: Minimal assistance;Rolling walker;Cueing for safety;Cueing for sequencing General ADL Comments: pt. receptive to cues for safety and sequencing.  noted panic and change of ambulation pace when he felt urinary urgency.  reviewed need for controled pace even if urinary urgency presents.  reviewed other fall prevention strategies for the home as well     Vision       Perception     Praxis      Cognition Arousal/Alertness: Awake/alert Behavior During Therapy: WFL for tasks assessed/performed Overall Cognitive Status: Impaired/Different from baseline Area of Impairment: Safety/judgement;Problem solving;Awareness;Attention                   Current Attention Level: Focused     Safety/Judgement: Decreased awareness of safety;Decreased awareness of deficits Awareness: Intellectual Problem Solving: Requires verbal cues;Slow processing          Exercises General Exercises - Lower Extremity Ankle Circles/Pumps: AROM;Strengthening;Both;20 reps;Supine Heel Slides: AROM;Strengthening;Both;20 reps;Supine Hip ABduction/ADduction: AROM;Strengthening;Both;20 reps;Supine  Straight Leg Raises: AROM;Strengthening;Right;20 reps;Supine   Shoulder Instructions       General Comments      Pertinent Vitals/ Pain       Pain Assessment: No/denies pain Pain Score: 7  Pain Location: L ribs Pain Descriptors / Indicators: Aching;Guarding;Grimacing  Home Living                                          Prior Functioning/Environment              Frequency  Min 2X/week        Progress Toward Goals  OT Goals(current goals can now be found in  the care plan section)  Progress towards OT goals: Progressing toward goals  Acute Rehab OT Goals Patient Stated Goal: agreeable to rehab  Plan Discharge plan remains appropriate;Frequency remains appropriate    Co-evaluation                 AM-PAC OT "6 Clicks" Daily Activity     Outcome Measure   Help from another person eating meals?: None Help from another person taking care of personal grooming?: A Little Help from another person toileting, which includes using toliet, bedpan, or urinal?: A Little Help from another person bathing (including washing, rinsing, drying)?: A Little Help from another person to put on and taking off regular upper body clothing?: A Little Help from another person to put on and taking off regular lower body clothing?: A Lot 6 Click Score: 18    End of Session Equipment Utilized During Treatment: Gait belt;Rolling walker  OT Visit Diagnosis: Unsteadiness on feet (R26.81);Other abnormalities of gait and mobility (R26.89);Muscle weakness (generalized) (M62.81);Dizziness and giddiness (R42)   Activity Tolerance Patient tolerated treatment well   Patient Left in chair;with call bell/phone within reach;with chair alarm set   Nurse Communication Other (comment) (CNA AND RN attempted to turn off bed alarm, CNA states she would turn it back off when pt. returned to bed later today)        Time: 6803-2122 OT Time Calculation (min): 14 min  Charges: OT General Charges $OT Visit: 1 Visit OT Treatments $Self Care/Home Management : 8-22 mins  Sonia Baller, COTA/L Acute Rehabilitation [phone]   Janice Coffin 01/18/2020, 12:53 PM

## 2020-01-18 NOTE — Progress Notes (Signed)
   Subjective: Patient reports feeling well this morning. States his appetite is somewhat decreased. Denies any trouble breathing. He has not had any falls since about two days. He states he has been working with PT and it has been going well. He still feels a little dizzy when walking.  Objective:  Vital signs in last 24 hours: Vitals:   01/16/20 2143 01/17/20 0536 01/17/20 1342 01/17/20 2147  BP: 118/69 125/80 113/61 120/74  Pulse: 99 93 98 84  Resp: 16 16 19 20   Temp: 98.6 F (37 C) 98.5 F (36.9 C) 98.1 F (36.7 C) 98.2 F (36.8 C)  TempSrc: Oral Oral  Oral  SpO2: 100% 100% 97% 98%  Weight:      Height:       Physical Exam- General: sitting up in bed, NAD CV: RRR, no m/r/r Lungs: CTA bilaterally, normal work of breathing  Abdomen: soft, bowel sounds present, non-tender MSK: no LE edema, no tenderness  Assessment/Plan:  Regionald Hubbardis a 64 year old male with past medical history ofhypertension, type 2 diabetes on insulin, major depressive disorder, bipolar disorder, PTSD, renal cell carcinomas/pnephrectomy, chronic knee pain whoadmittedhospital for bilateral lower extremity swelling and shortness of breath,found to have a large right pleural effusiondue to acute onset of HFrEF.  Principal Problem:   HFrEF (heart failure with reduced ejection fraction) (HCC) Active Problems:   HLD (hyperlipidemia)   Essential hypertension   Diabetes type 2, controlled (HCC)   Bilateral lower extremity edema   Shortness of breath   Tobacco use disorder   Obesity   Orthostatic hypotension   Pleural effusion due to congestive heart failure (HCC)   Aortic atherosclerosis (HCC)   Coronary artery disease   Hepatic cyst   Acute heart failure with reduced EF(25-30%) (newly diagnosed) Transudative right pleural effusion s/p thoracentesis (11/20). CAD Aortic atherosclosis Chronic hypertension and hyperlipidemia Urine output ~850 ml in the past 24 hours. Last recorded  weight 72 kg, lower than presumed dry weight of 82 kg. Euvolemic on exam. Continuing course, awaiting for SNF placement.  -ContinueLipitor -Continue metoprolol succinate 12.5 mg daily -Continue Lasix 20 mg daily -No MRAgiven AKI Per cardiology -Outpatient ischemic work-up with cardiology - PT/OT: Pending placement at SNF   Dizziness Orthostatic hypotension Improved as he continues to work with PT. No more reported falls over past two days. -Pending SNF placement -Continue PT  UncontrolledType 2 diabetes with Hyperglycemia -Lantus 15 units daily, Novologto 8UnitsTID -Sliding scale insulin -Monitor CBG  Liver cyst -Follow up outpatient  Dispo: Anticipated discharge pending SNF placement.   Mike Craze, DO 01/18/2020, 6:15 AM Pager: [phone] After 5pm on weekdays and 1pm on weekends: On Call pager [phone]

## 2020-01-18 NOTE — TOC Progression Note (Signed)
Transition of Care [facility]) - Progression Note    Patient Details  Name: [name] MRN: [id number] Date of Birth: 19-Dec-1955  [facility] CM/SW Contact  Joanne Chars, LCSW Phone Number: 01/18/2020, 1:29 PM  Clinical Narrative:   Several calls made to [place] and [place] about referrals.  No updates/no return calls yet.     Expected Discharge Plan: Home/Self Care Barriers to Discharge: Continued Medical Work up  Expected Discharge Plan and Services Expected Discharge Plan: Home/Self Care In-house Referral: NA Discharge Planning Services: CM Consult   Living arrangements for the past 2 months: Single Family Home                 DME Arranged: N/A DME Agency: NA       HH Arranged: RN, PT, OT, Nurse's Aide [place] Agency: [facility] Date HH Agency Contacted: 01/09/20 Time [place]: 1508 Representative spoke with at [place]: [place] Interventions    Readmission Risk Interventions No flowsheet data found.

## 2020-01-18 NOTE — Progress Notes (Signed)
Physical Therapy Treatment Patient Details Name: [name] MRN: [id number] DOB: December 31, 1955 Today's Date: 01/18/2020    History of Present Illness 64yo male c/o DOE and BLE edema, CXR with volume overload and R pleural effusion. Significant systolic impairment found on ECHO. Negative for PE. PMH anxiety, CA, DM, h/o suicide attempt, HTN, HLD, osteoporosis    PT Comments    Pt fully participated in session. Pt able to perform therex without assist. Improved balance during sit<>stand and during ambulation. Pt became fatigued during ambulation. Pt will benefit from skilled PT to address deficits in balance, strength, coordination, gait and endurance to maximize independence with functional mobility prior to discharge.     Follow Up Recommendations  SNF     Equipment Recommendations  Rolling walker with 5" wheels    Recommendations for Other Services       Precautions / Restrictions      Mobility  Bed Mobility                  Transfers Overall transfer level: Needs assistance Equipment used: Rolling walker (2 wheeled) Transfers: Sit to/from Stand Sit to Stand: Supervision         General transfer comment: VCs hand placement and to scoot to edge of chair, no loss of balance  Ambulation/Gait Ambulation/Gait assistance: Min guard;Supervision Gait Distance (Feet): 120 Feet Assistive device: Rolling walker (2 wheeled) Gait Pattern/deviations: Step-through pattern;Decreased stride length;Decreased step length - right;Decreased step length - left     General Gait Details: no LOB noted, min guard with turns   Marine scientist [place] (Stroke Patients Only)       Balance                                            Cognition                                              Exercises General Exercises - Lower Extremity Ankle Circles/Pumps: AROM;Strengthening;Both;20  reps;Supine Heel Slides: AROM;Strengthening;Both;20 reps;Supine Hip ABduction/ADduction: AROM;Strengthening;Both;20 reps;Supine Straight Leg Raises: AROM;Strengthening;Right;20 reps;Supine    General Comments        Pertinent Vitals/Pain Pain Score: 7  Pain Location: L ribs Pain Descriptors / Indicators: Aching;Guarding;Grimacing    Home Living                      Prior Function            PT Goals (current goals can now be found in the care plan section) Acute Rehab PT Goals Patient Stated Goal: agreeable to rehab PT Goal Formulation: With patient Time For Goal Achievement: 01/22/20 Potential to Achieve Goals: Good Progress towards PT goals: Progressing toward goals    Frequency    Min 3X/week      PT Plan Current plan remains appropriate    Co-evaluation              AM-PAC PT "6 Clicks" Mobility   Outcome Measure  Help needed turning from your back to your side while in a flat bed without using bedrails?: None Help needed moving from lying on your back to sitting on the side  of a flat bed without using bedrails?: A Little Help needed moving to and from a bed to a chair (including a wheelchair)?: A Little Help needed standing up from a chair using your arms (e.g., wheelchair or bedside chair)?: A Little Help needed to walk in hospital room?: A Little Help needed climbing 3-5 steps with a railing? : A Little 6 Click Score: 19    End of Session Equipment Utilized During Treatment: Gait belt Activity Tolerance: Patient tolerated treatment well Patient left: in chair;with call bell/phone within reach;with chair alarm set Nurse Communication: Mobility status PT Visit Diagnosis: Unsteadiness on feet (R26.81);Muscle weakness (generalized) (M62.81);Difficulty in walking, not elsewhere classified (R26.2)     Time: 1410-3013 PT Time Calculation (min) (ACUTE ONLY): 16 min  Charges:  $Therapeutic Exercise: 8-22 mins                     Lyanne Co, DPT Acute Rehabilitation Services [phone]   [place] 01/18/2020, 11:47 AM

## 2020-01-18 NOTE — Progress Notes (Signed)
   RE:  [name]       Date of Birth: November 17, 2055      Date:   01/18/20       To Whom It May Concern:  Please be advised that the above-named patient will require a short-term nursing home stay - anticipated 30 days or less for rehabilitation and strengthening.  The plan is for return home.                 MD signature                Date

## 2020-01-19 LAB — GLUCOSE, CAPILLARY
Glucose-Capillary: 142 mg/dL — ABNORMAL HIGH (ref 70–99)
Glucose-Capillary: 194 mg/dL — ABNORMAL HIGH (ref 70–99)
Glucose-Capillary: 235 mg/dL — ABNORMAL HIGH (ref 70–99)
Glucose-Capillary: 95 mg/dL (ref 70–99)
Glucose-Capillary: 198 mg/dL — ABNORMAL HIGH (ref 70–99)

## 2020-01-19 NOTE — Progress Notes (Signed)
HD#15 Subjective:  Overnight Events: No events overnight  Patient resting comfortably in bed.  He denies any new complaints.  Objective:  Vital signs in last 24 hours: Vitals:   01/17/20 2147 01/18/20 0800 01/18/20 1348 01/18/20 2200  BP: 120/74  125/80 123/86  Pulse: 84  94 (!) 108  Resp: 20   18  Temp: 98.2 F (36.8 C)  98.1 F (36.7 C) 97.9 F (36.6 C)  TempSrc: Oral   Oral  SpO2: 98% 99% 97% 97%  Weight:      Height:       Supplemental O2: Room Air SpO2: 97 %   Physical Exam:  Physical Exam Constitutional:      Appearance: Normal appearance.  HENT:     Head: Normocephalic and atraumatic.  Eyes:     Extraocular Movements: Extraocular movements intact.  Cardiovascular:     Rate and Rhythm: Normal rate.     Pulses: Normal pulses.     Heart sounds: Normal heart sounds.  Pulmonary:     Effort: Pulmonary effort is normal.     Breath sounds: Normal breath sounds.  Abdominal:     General: Bowel sounds are normal.     Palpations: Abdomen is soft.     Tenderness: There is no abdominal tenderness.  Musculoskeletal:        General: Normal range of motion.     Cervical back: Normal range of motion.     Right lower leg: No edema.     Left lower leg: No edema.  Skin:    General: Skin is warm and dry.  Neurological:     Mental Status: He is alert and oriented to person, place, and time. Mental status is at baseline.  Psychiatric:        Mood and Affect: Mood normal.     Filed Weights   01/14/20 2207 01/15/20 0104 01/16/20 0536  Weight: 83 kg 83 kg 72.1 kg     Intake/Output Summary (Last 24 hours) at 01/19/2020 0546 Last data filed at 01/19/2020 0300 Gross per 24 hour  Intake --  Output 1200 ml  Net -1200 ml   Net IO Since Admission: -6,628 mL [01/19/20 0546]  Pertinent Labs: CBC Latest Ref Rng & Units 01/17/2020 01/07/2020 01/06/2020  WBC 4.0 - 10.5 K/uL 4.9 4.1 4.3  Hemoglobin 13.0 - 17.0 g/dL 14.4 12.7(L) 12.7(L)  Hematocrit 39 - 52 % 46.2 40.9  40.2  Platelets 150 - 400 K/uL 192 176 181    CMP Latest Ref Rng & Units 01/18/2020 01/17/2020 01/16/2020  Glucose 70 - 99 mg/dL - 142(H) 191(H)  BUN 8 - 23 mg/dL - 17 17  Creatinine 0.61 - 1.24 mg/dL 1.19 1.26(H) 1.34(H)  Sodium 135 - 145 mmol/L - 137 138  Potassium 3.5 - 5.1 mmol/L - 4.3 4.4  Chloride 98 - 111 mmol/L - 103 104  CO2 22 - 32 mmol/L - 25 23  Calcium 8.9 - 10.3 mg/dL - 9.5 9.4  Total Protein 6.5 - 8.1 g/dL - - -  Total Bilirubin 0.3 - 1.2 mg/dL - - -  Alkaline Phos 38 - 126 U/L - - -  AST 15 - 41 U/L - - -  ALT 0 - 44 U/L - - -    Imaging: No results found.  Assessment/Plan:   Principal Problem:   HFrEF (heart failure with reduced ejection fraction) (HCC) Active Problems:   HLD (hyperlipidemia)   Essential hypertension   Diabetes type 2, controlled ([place])  Bilateral lower extremity edema   Shortness of breath   Tobacco use disorder   Obesity   Orthostatic hypotension   Pleural effusion due to congestive heart failure (HCC)   Aortic atherosclerosis (HCC)   Coronary artery disease   Hepatic cyst   Patient Summary: [name] is a 64 y.o. with a pertinent PMH of hypertension, type 2 diabetes on insulin, major depressive disorder, bipolar disorder, PTSD, renal cell carcinoma status post nephrectomy, chronic knee pain,, who presented with bilateral lower extremity swelling and shortness of breath and admitted for new onset HFrEF complicated by large right transudative pleural effusion status post thoracentesis.    Acute heart failure with reduced EF(25-30%) (newly diagnosed) Transudative right pleural effusion s/p thoracentesis (11/20). CAD Aortic atherosclosis Chronic hypertension and hyperlipidemia Patient is asymptomatic and euvolemic. He is net 5 L since admission. Current weight is 72 kg. This is likely his new dry weight. Patient tolerating his new medications for heart failure. He is currently waiting SNF through the [place].  Patient will get  outpatient ischemic evaluation through cardiology. -Continue Lipitor 40 mg -Continue p.o. diuresis with furosemide 20 mg daily -Continue metoprolol succinate 12.5 mg daily - PT/OT: Pending placement at SNF  Dizziness Orthostatic hypotension Patient had significant orthostatic hypotension with dizziness particularly with walking. This was thought to be secondary to medications added for heart failure such as Entresto. Patient also has significant mitral regurg which could be contributing to his symptoms. Dizziness has improved since titrating medications. He does have generalized weakness and is making good improvement with PT here in the hospital. -Pending SNF placement -Continue PT  UncontrolledType 2 diabeteswith Hyperglycemia Patient's blood sugars have been reasonably controlled on Lantus 25 units daily and NovoLog 5 units 3 times daily. Patient's fasting glucose this morning was 303 which is an outlier for him as he has had fasting glucose is around 122-189. He appears to have good prandial coverage with mealtime blood sugars in the low to mid 100s. There does appear to be some significant swings in his blood sugars. He may benefit from increasing Lantus and decreasing NovoLog. I would increase him to 30 units of Lantus and decrease his NovoLog to 3 units 3 times daily with meals with SSI.  -Continue Lantus 25 units nightly -Continue NovoLog 5 units 3 times daily with meals insulin scale insulin -Consider increasing Lantus and decreasing NovoLog -We will switch patient to increased CBG monitoring better readings.  Liver cyst -Follow up outpatient  Diet: Heart Healthy IVF: None,None VTE: Enoxaparin Code: Full PT/OT recs: SNF for Subacute PT, none. TOC recs[facility]   Dispo: Anticipated discharge to Skilled nursing facility in 1 days pending placement.    Please contact the on call pager after 5 pm and on weekends at [phone].

## 2020-01-19 NOTE — Progress Notes (Signed)
Patients blood sugar checked by Nurse Tech, results showed to be greater than 600. I asked the NT to recheck patients blood sugar again (all within 5 minutes) and patients blood sugar was 235.  Will give insulin as ordered for 235.  Will continue to monitor.

## 2020-01-20 LAB — GLUCOSE, CAPILLARY
Glucose-Capillary: 220 mg/dL — ABNORMAL HIGH (ref 70–99)
Glucose-Capillary: 171 mg/dL — ABNORMAL HIGH (ref 70–99)
Glucose-Capillary: 174 mg/dL — ABNORMAL HIGH (ref 70–99)
Glucose-Capillary: 283 mg/dL — ABNORMAL HIGH (ref 70–99)

## 2020-01-20 NOTE — Progress Notes (Signed)
HD#16 Subjective:  Overnight Events: No events overnight  Patient resting comfortably in bed.  Feeling well, hopeful of getting a bed at a NF soon. Continuing to do well with PT. He denies any new complaints.  Objective:  Vital signs in last 24 hours: Vitals:   01/18/20 2200 01/19/20 1625 01/19/20 2025 01/20/20 0453  BP: 123/86 117/78 120/72 128/83  Pulse: (!) 108 93 (!) 102 (!) 109  Resp: 18 (!) 22 18 18   Temp: 97.9 F (36.6 C)  98.1 F (36.7 C) 98.6 F (37 C)  TempSrc: Oral  Oral Oral  SpO2: 97% 96% 94% 98%  Weight:      Height:       Supplemental O2: Room Air SpO2: 98 %   Physical Exam:  Physical Exam Constitutional:      Appearance: Normal appearance.  HENT:     Head: Normocephalic and atraumatic.  Eyes:     Extraocular Movements: Extraocular movements intact.  Cardiovascular:     Rate and Rhythm: Normal rate.     Pulses: Normal pulses.     Heart sounds: Normal heart sounds.  Pulmonary:     Effort: Pulmonary effort is normal.     Breath sounds: Normal breath sounds.  Abdominal:     General: Bowel sounds are normal.     Palpations: Abdomen is soft.     Tenderness: There is no abdominal tenderness.  Musculoskeletal:        General: Normal range of motion.     Cervical back: Normal range of motion.     Right lower leg: No edema.     Left lower leg: No edema.  Skin:    General: Skin is warm and dry.  Neurological:     General: No focal deficit present.     Mental Status: He is alert and oriented to person, place, and time. Mental status is at baseline.  Psychiatric:        Mood and Affect: Mood normal.     Filed Weights   01/14/20 2207 01/15/20 0104 01/16/20 0536  Weight: 83 kg 83 kg 72.1 kg     Intake/Output Summary (Last 24 hours) at 01/20/2020 0602 Last data filed at 01/20/2020 0500 Gross per 24 hour  Intake --  Output 1100 ml  Net -1100 ml   Net IO Since Admission: -7,728 mL [01/20/20 0602]  Pertinent Labs: CBC Latest Ref Rng & Units  01/17/2020 01/07/2020 01/06/2020  WBC 4.0 - 10.5 K/uL 4.9 4.1 4.3  Hemoglobin 13.0 - 17.0 g/dL 14.4 12.7(L) 12.7(L)  Hematocrit 39 - 52 % 46.2 40.9 40.2  Platelets 150 - 400 K/uL 192 176 181    CMP Latest Ref Rng & Units 01/18/2020 01/17/2020 01/16/2020  Glucose 70 - 99 mg/dL - 142(H) 191(H)  BUN 8 - 23 mg/dL - 17 17  Creatinine 0.61 - 1.24 mg/dL 1.19 1.26(H) 1.34(H)  Sodium 135 - 145 mmol/L - 137 138  Potassium 3.5 - 5.1 mmol/L - 4.3 4.4  Chloride 98 - 111 mmol/L - 103 104  CO2 22 - 32 mmol/L - 25 23  Calcium 8.9 - 10.3 mg/dL - 9.5 9.4  Total Protein 6.5 - 8.1 g/dL - - -  Total Bilirubin 0.3 - 1.2 mg/dL - - -  Alkaline Phos 38 - 126 U/L - - -  AST 15 - 41 U/L - - -  ALT 0 - 44 U/L - - -    Imaging: No results found.  Assessment/Plan:   Principal Problem:  HFrEF (heart failure with reduced ejection fraction) (HCC) Active Problems:   HLD (hyperlipidemia)   Essential hypertension   Diabetes type 2, controlled (HCC)   Bilateral lower extremity edema   Shortness of breath   Tobacco use disorder   Obesity   Orthostatic hypotension   Pleural effusion due to congestive heart failure (HCC)   Aortic atherosclerosis (HCC)   Coronary artery disease   Hepatic cyst   Patient Summary: [name] is a 64 y.o. with a pertinent PMH of hypertension, type 2 diabetes on insulin, major depressive disorder, bipolar disorder, PTSD, renal cell carcinoma status post nephrectomy, chronic knee pain,, who presented with bilateral lower extremity swelling and shortness of breath and admitted for new onset HFrEF complicated by large right transudative pleural effusion status post thoracentesis.   Acute heart failure with reduced EF(25-30%) (newly diagnosed) Transudative right pleural effusion s/p thoracentesis (11/20). CAD Aortic atherosclosis Chronic hypertension and hyperlipidemia Patient is asymptomatic and euvolemic. He is net 5 L since admission. Current weight is 72 kg. This is  likely his new dry weight. Patient tolerating his new medications for heart failure. He is currently waiting SNF through the [place].  Patient will get outpatient ischemic evaluation through cardiology. -Continue Lipitor 40 mg -Continue p.o. diuresis with furosemide 20 mg daily -Continue metoprolol succinate 12.5 mg daily - PT/OT: Pending placement at SNF  Dizziness Orthostatic hypotension Patient had significant orthostatic hypotension with dizziness particularly with walking. This was thought to be secondary to medications added for heart failure such as Entresto. Patient also has significant mitral regurg which could be contributing to his symptoms. Dizziness has improved since titrating medications. He does have generalized weakness and is making good improvement with PT here in the hospital. -Pending SNF placement -Continue PT  UncontrolledType 2 diabeteswith Hyperglycemia Patient's blood sugars have been elevated Lantus 25 units daily and NovoLog 5 units 3 times daily. Patient's fasting glucose this morning was 220. Mealtime blood sugars with significant swings between 95-198. Will increase basal insulin and decrease mealtime coverage. -Lantus 30 units nightly, NovoLog 3 units 3 times daily with meals -SSI -Consider increasing Lantus and decreasing NovoLog -We will switch patient to increased CBG monitoring better readings.  Liver cyst -Follow up outpatient  Diet: Heart Healthy IVF: None,None VTE: Enoxaparin Code: Full PT/OT recs: SNF for Subacute PT, none. TOC recs[facility]   Dispo: Anticipated discharge to Skilled nursing facility in 1 days pending placement.   [name] 01/20/20 10:38 AM Pager [phone]  Please contact the on call pager after 5 pm and on weekends at [phone].

## 2020-01-21 LAB — GLUCOSE, CAPILLARY
Glucose-Capillary: >600 mg/dL (ref 70–99)
Glucose-Capillary: 254 mg/dL — ABNORMAL HIGH (ref 70–99)
Glucose-Capillary: 241 mg/dL — ABNORMAL HIGH (ref 70–99)
Glucose-Capillary: 127 mg/dL — ABNORMAL HIGH (ref 70–99)
Glucose-Capillary: 234 mg/dL — ABNORMAL HIGH (ref 70–99)

## 2020-01-21 MED ORDER — INSULIN GLARGINE 100 UNIT/ML ~~LOC~~ SOLN
5.0000 [IU] | Freq: Once | SUBCUTANEOUS | Status: AC
  Administered 2020-01-21: 5 [IU] via SUBCUTANEOUS
  Filled 2020-01-21: qty 0.05

## 2020-01-21 MED ORDER — INSULIN ASPART 100 UNIT/ML ~~LOC~~ SOLN
3.0000 [IU] | Freq: Three times a day (TID) | SUBCUTANEOUS | Status: DC
  Administered 2020-01-21 – 2020-01-22 (×5): 3 [IU] via SUBCUTANEOUS

## 2020-01-21 MED ORDER — INSULIN GLARGINE 100 UNIT/ML ~~LOC~~ SOLN
30.0000 [IU] | Freq: Every day | SUBCUTANEOUS | Status: DC
  Administered 2020-01-21: 30 [IU] via SUBCUTANEOUS
  Filled 2020-01-21 (×2): qty 0.3

## 2020-01-21 NOTE — Progress Notes (Signed)
HD#17 Subjective:  Overnight Events: No events overnight  Patient feeling well walking well with PT. No new complaints.  Objective:  Vital signs in last 24 hours: Vitals:   01/19/20 1625 01/19/20 2025 01/20/20 0453 01/20/20 2343  BP: 117/78 120/72 128/83 128/78  Pulse: 93 (!) 102 (!) 109 (!) 110  Resp: (!) 22 18 18 18   Temp:  98.1 F (36.7 C) 98.6 F (37 C) 97.7 F (36.5 C)  TempSrc:  Oral Oral Oral  SpO2: 96% 94% 98% 98%  Weight:      Height:       Supplemental O2: Room Air SpO2: 98 %   Physical Exam:  Physical Exam Constitutional:      Appearance: Normal appearance.  HENT:     Head: Normocephalic and atraumatic.  Eyes:     Extraocular Movements: Extraocular movements intact.  Neck:     Comments: No JVD Cardiovascular:     Rate and Rhythm: Normal rate.     Pulses: Normal pulses.     Heart sounds: Normal heart sounds.  Pulmonary:     Effort: Pulmonary effort is normal.     Breath sounds: Normal breath sounds.  Abdominal:     General: Bowel sounds are normal.     Palpations: Abdomen is soft.     Tenderness: There is no abdominal tenderness.  Musculoskeletal:        General: Normal range of motion.     Cervical back: Normal range of motion.     Right lower leg: No edema.     Left lower leg: No edema.  Skin:    General: Skin is warm and dry.  Neurological:     General: No focal deficit present.     Mental Status: He is alert and oriented to person, place, and time. Mental status is at baseline.  Psychiatric:        Mood and Affect: Mood normal.    Filed Weights   01/14/20 2207 01/15/20 0104 01/16/20 0536  Weight: 83 kg 83 kg 72.1 kg     Intake/Output Summary (Last 24 hours) at 01/21/2020 0536 Last data filed at 01/20/2020 2200 Gross per 24 hour  Intake -  Output 1000 ml  Net -1000 ml   Net IO Since Admission: -8,728 mL [01/21/20 0536]  Pertinent Labs: CBC Latest Ref Rng & Units 01/17/2020 01/07/2020 01/06/2020  WBC 4.0 - 10.5 K/uL 4.9 4.1  4.3  Hemoglobin 13.0 - 17.0 g/dL 14.4 12.7(L) 12.7(L)  Hematocrit 39 - 52 % 46.2 40.9 40.2  Platelets 150 - 400 K/uL 192 176 181    CMP Latest Ref Rng & Units 01/18/2020 01/17/2020 01/16/2020  Glucose 70 - 99 mg/dL - 142(H) 191(H)  BUN 8 - 23 mg/dL - 17 17  Creatinine 0.61 - 1.24 mg/dL 1.19 1.26(H) 1.34(H)  Sodium 135 - 145 mmol/L - 137 138  Potassium 3.5 - 5.1 mmol/L - 4.3 4.4  Chloride 98 - 111 mmol/L - 103 104  CO2 22 - 32 mmol/L - 25 23  Calcium 8.9 - 10.3 mg/dL - 9.5 9.4  Total Protein 6.5 - 8.1 g/dL - - -  Total Bilirubin 0.3 - 1.2 mg/dL - - -  Alkaline Phos 38 - 126 U/L - - -  AST 15 - 41 U/L - - -  ALT 0 - 44 U/L - - -    Imaging: No results found.  Assessment/Plan:   Principal Problem:   HFrEF (heart failure with reduced ejection fraction) (HCC) Active  Problems:   HLD (hyperlipidemia)   Essential hypertension   Diabetes type 2, controlled (HCC)   Bilateral lower extremity edema   Shortness of breath   Tobacco use disorder   Obesity   Orthostatic hypotension   Pleural effusion due to congestive heart failure (HCC)   Aortic atherosclerosis (HCC)   Coronary artery disease   Hepatic cyst  Patient Summary: [name] is a 64 y.o. with a pertinent PMH of hypertension, type 2 diabetes on insulin, major depressive disorder, bipolar disorder, PTSD, renal cell carcinoma status post nephrectomy, chronic knee pain, who presented with bilateral lower extremity swelling and shortness of breath and admitted for new onset HFrEF complicated by large right transudative pleural effusion status post thoracentesis.   Acute heart failure with reduced EF(25-30%) (newly diagnosed) Transudative right pleural effusion s/p thoracentesis (11/20). CAD Aortic atherosclosis Chronic hypertension and hyperlipidemia Patient feeling well, asymptomatic and euvolemic on exam. He is net 6.4 L since admission.  New dry weight of 72kg. He is currently waiting SNF through the [place]. Will need  outpatient ischemic evaluation through cardiology. -Continue Lipitor 40 mg -Continue p.o. diuresis with furosemide 20 mg daily -Continue metoprolol succinate 12.5 mg daily - PT/OT: Pending placement at SNF  Dizziness Orthostatic hypotension Secondary to heart failure medications or significant mitral regurgitation. Dizziness has improved since titrating medications and PT. Otherwise still has generalized weakness, continues to work with PT.  -Pending SNF placement -Continue PT  UncontrolledType 2 diabeteswith Hyperglycemia Patient's fasting glucose this morning was 283. Mealtime blood sugars in 170s.  -Lantus 30 units nightly, NovoLog 3 units 3 times daily with meals -SSI  Liver cyst -Follow up outpatient  Diet: Heart Healthy IVF: None,None VTE: Enoxaparin Code: Full PT/OT recs: SNF for Subacute PT, none. TOC recs: [facility]  Dispo: Anticipated discharge to Skilled nursing facility in 1 days pending placement.   [name] 01/21/20 5:36 AM Pager [phone]  Please contact the on call pager after 5 pm and on weekends at [phone].

## 2020-01-21 NOTE — TOC Progression Note (Addendum)
Transition of Care [facility]) - Progression Note    Patient Details  Name: [name] MRN: [id number] Date of Birth: Dec 30, 1955  [facility] of Care [facility]) CM/SW Contact  Joanne Chars, LCSW Phone Number: 01/21/2020, 9:18 AM  Clinical Narrative:   CSW again called attempting to get updates on referrals to [place] and [place].  LM with [place].  No answer at [facility] care.  CSW texted Whitney at [facility] no [facility] beds today, possibly later this week.  CSW sent referral through hub.  Still no approval in PASSR.   CSW spoke with Celedonio Savage, [place], informed her that cannot get bed offers based on the initial five facilities.  She will email a list of additional facilities to utilize.   1320: Yolanda Manges completed telephonic PASSR evaluation.    Expected Discharge Plan: Home/Self Care Barriers to Discharge: Continued Medical Work up  Expected Discharge Plan and Services Expected Discharge Plan: Home/Self Care In-house Referral: NA Discharge Planning Services: CM Consult   Living arrangements for the past 2 months: Single Family Home                 DME Arranged: N/A DME Agency: NA       HH Arranged: RN, PT, OT, Nurse's Aide [place] Agency: [place] ([facility]) Date HH Agency Contacted: 01/09/20 Time [place]: 1508 Representative spoke with at [place]: [place] ([place]) Interventions    Readmission Risk Interventions No flowsheet data found.

## 2020-01-21 NOTE — Progress Notes (Signed)
Physical Therapy Treatment Patient Details Name: [name] MRN: [id number] DOB: December 12, 1955 Today's Date: 01/21/2020    History of Present Illness 64yo male c/o DOE and BLE edema, CXR with volume overload and R pleural effusion. Significant systolic impairment found on ECHO. Negative for PE. PMH anxiety, CA, DM, h/o suicide attempt, HTN, HLD, osteoporosis    PT Comments    Pt agreeable to therapy. Pt requesting to walk. Pt requiring increased cueing to correct poor sequencing of sit>stand with RW. Pt requiring min A due to increased posterior lean and steadying of RW. Pt min guard-S during ambulation with RW, decreased velocity. Pt will benefit from skilled PT to address deficits in balance, strength, coordination, gait, endurance and safety to maximize independence with functional mobility prior to discharge.    Follow Up Recommendations  SNF     Equipment Recommendations  Rolling walker with 5" wheels    Recommendations for Other Services       Precautions / Restrictions      Mobility  Bed Mobility Overal bed mobility: Modified Independent Bed Mobility: Supine to Sit     Supine to sit: Modified independent (Device/Increase time)        Transfers Overall transfer level: Needs assistance Equipment used: Rolling walker (2 wheeled) Transfers: Sit to/from Stand Sit to Stand: Min assist         General transfer comment: Pt attempted to place hand in the middle of the RW, therapist corrected and placed hand on bed and 1 on RW, pt requiring min A for balance and steadying of RW  Ambulation/Gait Ambulation/Gait assistance: Min guard;Supervision Gait Distance (Feet): 210 Feet Assistive device: Rolling walker (2 wheeled) Gait Pattern/deviations: Step-through pattern;Decreased stride length;Decreased step length - right;Decreased step length - left Gait velocity: decreased       Stairs             Wheelchair Mobility    Modified Rankin (Stroke Patients  Only)       Balance                                            Cognition                                              Exercises General Exercises - Lower Extremity Heel Slides: AROM;Strengthening;Both;20 reps;Supine    General Comments        Pertinent Vitals/Pain      Home Living                      Prior Function            PT Goals (current goals can now be found in the care plan section) Acute Rehab PT Goals Patient Stated Goal: agreeable to rehab PT Goal Formulation: With patient Time For Goal Achievement: 01/22/20 Potential to Achieve Goals: Good Progress towards PT goals: Progressing toward goals    Frequency    Min 3X/week      PT Plan Current plan remains appropriate    [name]-evaluation              AM-PAC PT "6 Clicks" Mobility   Outcome Measure  Help needed turning from your back to your side while in a flat bed  without using bedrails?: None Help needed moving from lying on your back to sitting on the side of a flat bed without using bedrails?: None Help needed moving to and from a bed to a chair (including a wheelchair)?: A Little Help needed standing up from a chair using your arms (e.g., wheelchair or bedside chair)?: A Little Help needed to walk in hospital room?: A Little Help needed climbing 3-5 steps with a railing? : A Little 6 Click Score: 20    End of Session Equipment Utilized During Treatment: Gait belt Activity Tolerance: Patient tolerated treatment well Patient left: in chair;with call bell/phone within reach;with chair alarm set Nurse Communication: Mobility status PT Visit Diagnosis: Unsteadiness on feet (R26.81);Muscle weakness (generalized) (M62.81);Difficulty in walking, not elsewhere classified (R26.2)     Time: 0813-8871 PT Time Calculation (min) (ACUTE ONLY): 10 min  Charges:  $Gait Training: 8-22 mins                     [name], DPT Acute Rehabilitation  Services [phone]   [name] 01/21/2020, 12:27 PM

## 2020-01-22 LAB — GLUCOSE, CAPILLARY
Glucose-Capillary: 268 mg/dL — ABNORMAL HIGH (ref 70–99)
Glucose-Capillary: 218 mg/dL — ABNORMAL HIGH (ref 70–99)
Glucose-Capillary: 203 mg/dL — ABNORMAL HIGH (ref 70–99)
Glucose-Capillary: 237 mg/dL — ABNORMAL HIGH (ref 70–99)

## 2020-01-22 MED ORDER — FUROSEMIDE 20 MG PO TABS
20.0000 mg | ORAL_TABLET | Freq: Two times a day (BID) | ORAL | Status: DC
  Administered 2020-01-22 – 2020-01-25 (×7): 20 mg via ORAL
  Filled 2020-01-22 (×7): qty 1

## 2020-01-22 MED ORDER — METFORMIN HCL 500 MG PO TABS
1000.0000 mg | ORAL_TABLET | Freq: Two times a day (BID) | ORAL | Status: DC
  Administered 2020-01-22 – 2020-01-25 (×7): 1000 mg via ORAL
  Filled 2020-01-22 (×7): qty 2

## 2020-01-22 MED ORDER — INSULIN GLARGINE 100 UNIT/ML ~~LOC~~ SOLN
35.0000 [IU] | Freq: Every day | SUBCUTANEOUS | Status: DC
  Administered 2020-01-22 – 2020-01-23 (×2): 35 [IU] via SUBCUTANEOUS
  Filled 2020-01-22 (×3): qty 0.35

## 2020-01-22 MED ORDER — INSULIN ASPART 100 UNIT/ML ~~LOC~~ SOLN
5.0000 [IU] | Freq: Three times a day (TID) | SUBCUTANEOUS | Status: DC
  Administered 2020-01-22 – 2020-01-25 (×10): 5 [IU] via SUBCUTANEOUS

## 2020-01-22 NOTE — Progress Notes (Signed)
Occupational Therapy Treatment Patient Details Name: [name] MRN: [id number] DOB: [birth year] Today's Date: 01/22/2020    History of present illness 64yo male c/o DOE and BLE edema, CXR with volume overload and R pleural effusion. Significant systolic impairment found on ECHO. Negative for PE. PMH anxiety, CA, DM, h/o suicide attempt, HTN, HLD, osteoporosis   OT comments  Pt making steady progress towards OT goals this session. Pt continues to present with baseline cognitive impairments, impaired coordination and generalized weakness impacting pts ability to complete BADLs independently. Pt abel to complete functional mobility with RW greater than a household distance with min guard- gross supervision. Pt able to complete standing grooming tasks with supervision and toileting tasks min guard- supervision. Noted improvements in pts dynamic balance as pt able to complete IADL task of making bed with min guard. Pt would continue to benefit from skilled occupational therapy while admitted and after d/c to address the below listed limitations in order to improve overall functional mobility and facilitate independence with BADL participation. DC plan remains appropriate, will follow acutely per POC.     Follow Up Recommendations  SNF;Supervision/Assistance - 24 hour    Equipment Recommendations  3 in 1 bedside commode;Tub/shower seat    Recommendations for Other Services      Precautions / Restrictions Precautions Precautions: Fall Precaution Comments: fell out of chair 11/30 (call bell didn't work per pt) Restrictions Weight Bearing Restrictions: No       Mobility Bed Mobility               General bed mobility comments: sitting EOB  Transfers Overall transfer level: Needs assistance Equipment used: Rolling walker (2 wheeled) Transfers: Sit to/from Stand Sit to Stand: Supervision         General transfer comment: close supervision each stand, x3 stands from EOB,  chair with arm rests and toilet. appropriate use of grab bar/ arm rest of chair each trial    Balance Overall balance assessment: Needs assistance Sitting-balance support: No upper extremity supported;Feet supported Sitting balance-[name]: Good     Standing balance support: No upper extremity supported;During functional activity Standing balance-[name]: Fair Standing balance comment: pt able to complete dynamic balance task of making bed with close min guard and no UE support with no LOB noted                           ADL either performed or assessed with clinical judgement   ADL Overall ADL's : Needs assistance/impaired     Grooming: Oral care;Standing;Supervision/safety Grooming Details (indicate cue type and reason): supervision for safety                 Toilet Transfer: Min guard;RW;Ambulation;Regular Toilet;Grab bars Toilet Transfer Details (indicate cue type and reason): minguard for safety Toileting- Clothing Manipulation and Hygiene: Supervision/safety;Sitting/lateral lean       Functional mobility during ADLs: Min guard;Supervision/safety;Rolling walker General ADL Comments: pt making good progress towards OT goals this session, able to ambulate greater than a household distance with Rw and min guard- supervision, pt able to divide attention during task to engage in cognitive activity during mobility     Vision Patient Visual Report: No change from baseline     Perception     Praxis      Cognition Arousal/Alertness: Awake/alert Behavior During Therapy: WFL for tasks assessed/performed Overall Cognitive Status: Impaired/Different from baseline Area of Impairment: Attention;Awareness  Current Attention Level: Selective (initially became distracted in hallway d/t busy environemnt)       Awareness: Emergent Problem Solving: Requires verbal cues;Slow processing;Decreased initiation General Comments: pt  reports this session that he has a learning disability. however pt able to participate in "animal naming game" where pt to state animal based on letter of alphabet. pt required increased time to problem solve what letter was next but only needed assist to problem 2 animal names out of 14 letters.        Exercises     Shoulder Instructions       General Comments HR 134 bpm spO2 98% post mobility    Pertinent Vitals/ Pain       Pain Assessment: Faces Faces Pain Scale: Hurts a little bit Pain Location: L ribs from fall Pain Descriptors / Indicators: Aching;Guarding;Grimacing Pain Intervention(s): Limited activity within patient's tolerance;Monitored during session;Repositioned  Home Living                                          Prior Functioning/Environment              Frequency  Min 2X/week        Progress Toward Goals  OT Goals(current goals can now be found in the care plan section)  Progress towards OT goals: Progressing toward goals  Acute Rehab OT Goals Patient Stated Goal: agreeable to rehab OT Goal Formulation: With patient Time For Goal Achievement: 01/23/20 Potential to Achieve Goals: Good  Plan Discharge plan remains appropriate;Frequency remains appropriate    Co-evaluation                 AM-PAC OT "6 Clicks" Daily Activity     Outcome Measure   Help from another person eating meals?: None Help from another person taking care of personal grooming?: A Little Help from another person toileting, which includes using toliet, bedpan, or urinal?: A Little Help from another person bathing (including washing, rinsing, drying)?: A Little Help from another person to put on and taking off regular upper body clothing?: None Help from another person to put on and taking off regular lower body clothing?: A Little 6 Click Score: 20    End of Session Equipment Utilized During Treatment: Gait belt;Rolling walker  OT Visit Diagnosis:  Unsteadiness on feet (R26.81);Other abnormalities of gait and mobility (R26.89);Muscle weakness (generalized) (M62.81);Dizziness and giddiness (R42)   Activity Tolerance Patient tolerated treatment well   Patient Left in bed;with call bell/phone within reach;with bed alarm set;Other (comment) (sitting EOB)   Nurse Communication Mobility status        Time: [phone] OT Time Calculation (min): 25 min  Charges: OT General Charges $OT Visit: 1 Visit OT Treatments $Self Care/Home Management : 8-22 mins $Therapeutic Activity: 8-22 mins  Lanier Clam., COTA/L Acute Rehabilitation Services [phone]    Ihor Gully 01/22/2020, 8:36 AM

## 2020-01-22 NOTE — Plan of Care (Signed)
  Problem: Education: Goal: Knowledge of General Education information will improve Description: Including pain rating scale, medication(s)/side effects and non-pharmacologic comfort measures Outcome: Progressing   Problem: Health Behavior/Discharge Planning: Goal: Ability to manage health-related needs will improve Outcome: Progressing   Problem: Clinical Measurements: Goal: Ability to maintain clinical measurements within normal limits will improve Outcome: Progressing Goal: Will remain free from infection Outcome: Progressing Goal: Diagnostic test results will improve Outcome: Progressing Goal: Respiratory complications will improve Outcome: Progressing Goal: Cardiovascular complication will be avoided Outcome: Progressing   Problem: Activity: Goal: Risk for activity intolerance will decrease Outcome: Progressing   Problem: Nutrition: Goal: Adequate nutrition will be maintained Outcome: Progressing   Problem: Coping: Goal: Level of anxiety will decrease Outcome: Progressing   Problem: Elimination: Goal: Will not experience complications related to bowel motility Outcome: Progressing Goal: Will not experience complications related to urinary retention Outcome: Progressing   Problem: Pain Managment: Goal: General experience of comfort will improve Outcome: Progressing   Problem: Safety: Goal: Ability to remain free from injury will improve Outcome: Progressing   Problem: Skin Integrity: Goal: Risk for impaired skin integrity will decrease Outcome: Progressing   Problem: Education: Goal: Ability to describe self-care measures that may prevent or decrease complications (Diabetes Survival Skills Education) will improve Outcome: Progressing Goal: Individualized Educational Video(s) Outcome: Progressing   Problem: Coping: Goal: Ability to adjust to condition or change in health will improve Outcome: Progressing   Problem: Fluid Volume: Goal: Ability to  maintain a balanced intake and output will improve Outcome: Progressing   Problem: Health Behavior/Discharge Planning: Goal: Ability to identify and utilize available resources and services will improve Outcome: Progressing Goal: Ability to manage health-related needs will improve Outcome: Progressing   Problem: Metabolic: Goal: Ability to maintain appropriate glucose levels will improve Outcome: Progressing   Problem: Nutritional: Goal: Maintenance of adequate nutrition will improve Outcome: Progressing Goal: Progress toward achieving an optimal weight will improve Outcome: Progressing   Problem: Skin Integrity: Goal: Risk for impaired skin integrity will decrease Outcome: Progressing   Problem: Tissue Perfusion: Goal: Adequacy of tissue perfusion will improve Outcome: Progressing   Problem: Education: Goal: Ability to demonstrate management of disease process will improve Outcome: Progressing Goal: Ability to verbalize understanding of medication therapies will improve Outcome: Progressing Goal: Individualized Educational Video(s) Outcome: Progressing   Problem: Activity: Goal: Capacity to carry out activities will improve Outcome: Progressing   Problem: Cardiac: Goal: Ability to achieve and maintain adequate cardiopulmonary perfusion will improve Outcome: Progressing   

## 2020-01-22 NOTE — TOC Progression Note (Addendum)
Transition of Care [facility]) - Progression Note    Patient Details  Name: [name] MRN: [id number] Date of Birth: Apr 02, 1955  [facility] of [facility]) CM/SW Contact  Joanne Chars, LCSW Phone Number: 01/22/2020, 10:53 AM  Clinical Narrative:  CSW made additional referrals to [facility], [place], [place].  [place] reviewing at [facility], [place] reviewing at [place].     1330: TC [place]: she has not reviewed referral yet. 1450: TC [place]: pt was denied from both [facility] locations.     Expected Discharge Plan: Home/Self Care Barriers to Discharge: Continued Medical Work up  Expected Discharge Plan and Services Expected Discharge Plan: Home/Self Care In-house Referral: NA Discharge Planning Services: CM Consult   Living arrangements for the past 2 months: Single Family Home                 DME Arranged: N/A DME Agency: NA       HH Arranged: RN, PT, OT, Nurse's Aide [place] Agency: [place] ([facility]) Date HH Agency Contacted: 01/09/20 Time Home: 1508 Representative spoke with at [place]: [place] ([name]) Interventions    Readmission Risk Interventions No flowsheet data found.

## 2020-01-22 NOTE — Progress Notes (Signed)
HD#18 Subjective:  Overnight Events: No events overnight  Patient feeling well walking well with PT. No new complaints.States he dis his daily walk today. He feels like his legs are getting stronger but he knows he is not where he needs to be yet. Breath gets short sometimes when he is talking. States he notice a significant difference from before he came to the hospital. He was informed that SW is working on finding him a place for rehab.   Objective:  Vital signs in last 24 hours: Vitals:   01/21/20 2115 01/21/20 2131 01/22/20 0104 01/22/20 0504  BP: (!) 155/95   129/89  Pulse: (!) 119 (!) 108  (!) 111  Resp: 20   20  Temp: 97.8 F (36.6 C)   98.4 F (36.9 C)  TempSrc: Oral   Oral  SpO2: 98%   98%  Weight:  75.9 kg 75.9 kg   Height:       Supplemental O2: Room Air SpO2: 98 %   Physical Exam:  Physical Exam Constitutional:      Appearance: Normal appearance.  HENT:     Head: Normocephalic and atraumatic.  Eyes:     Extraocular Movements: Extraocular movements intact.  Neck:     Comments: No JVD Cardiovascular:     Rate and Rhythm: Normal rate.     Pulses: Normal pulses.     Heart sounds: Normal heart sounds.  Pulmonary:     Effort: Pulmonary effort is normal.     Breath sounds: Normal breath sounds.  Abdominal:     General: Bowel sounds are normal.     Palpations: Abdomen is soft.     Tenderness: There is no abdominal tenderness.  Musculoskeletal:        General: Normal range of motion.     Cervical back: Normal range of motion.     Right lower leg: No edema.     Left lower leg: No edema.  Skin:    General: Skin is warm and dry.  Neurological:     General: No focal deficit present.     Mental Status: He is alert and oriented to person, place, and time. Mental status is at baseline.  Psychiatric:        Mood and Affect: Mood normal.    Filed Weights   01/16/20 0536 01/21/20 2131 01/22/20 0104  Weight: 72.1 kg 75.9 kg 75.9 kg     Intake/Output  Summary (Last 24 hours) at 01/22/2020 0534 Last data filed at 01/22/2020 0336 Gross per 24 hour  Intake 360 ml  Output 550 ml  Net -190 ml   Net IO Since Admission: -8,918 mL [01/22/20 0534]  Pertinent Labs: CBC Latest Ref Rng & Units 01/17/2020 01/07/2020 01/06/2020  WBC 4.0 - 10.5 K/uL 4.9 4.1 4.3  Hemoglobin 13.0 - 17.0 g/dL 14.4 12.7(L) 12.7(L)  Hematocrit 39 - 52 % 46.2 40.9 40.2  Platelets 150 - 400 K/uL 192 176 181    CMP Latest Ref Rng & Units 01/18/2020 01/17/2020 01/16/2020  Glucose 70 - 99 mg/dL - 142(H) 191(H)  BUN 8 - 23 mg/dL - 17 17  Creatinine 0.61 - 1.24 mg/dL 1.19 1.26(H) 1.34(H)  Sodium 135 - 145 mmol/L - 137 138  Potassium 3.5 - 5.1 mmol/L - 4.3 4.4  Chloride 98 - 111 mmol/L - 103 104  CO2 22 - 32 mmol/L - 25 23  Calcium 8.9 - 10.3 mg/dL - 9.5 9.4  Total Protein 6.5 - 8.1 g/dL - - -  Total Bilirubin 0.3 - 1.2 mg/dL - - -  Alkaline Phos 38 - 126 U/L - - -  AST 15 - 41 U/L - - -  ALT 0 - 44 U/L - - -    Imaging: No results found.  Assessment/Plan:   Principal Problem:   HFrEF (heart failure with reduced ejection fraction) (HCC) Active Problems:   HLD (hyperlipidemia)   Essential hypertension   Diabetes type 2, controlled (HCC)   Bilateral lower extremity edema   Shortness of breath   Tobacco use disorder   Obesity   Orthostatic hypotension   Pleural effusion due to congestive heart failure (HCC)   Aortic atherosclerosis (HCC)   Coronary artery disease   Hepatic cyst  Patient Summary: [name] is a 64 y.o. with a pertinent PMH of hypertension, type 2 diabetes on insulin, major depressive disorder, bipolar disorder, PTSD, renal cell carcinoma status post nephrectomy, chronic knee pain, who presented with bilateral lower extremity swelling and shortness of breath and admitted for new onset HFrEF complicated by large right transudative pleural effusion status post thoracentesis.   Acute heart failure with reduced EF(25-30%) (newly  diagnosed) Transudative right pleural effusion s/p thoracentesis (11/20). CAD Aortic atherosclosis Chronic hypertension and hyperlipidemia Patient feeling well, only ocasionally short of breath with talking. Noted with weight increase to 75 kg today with decrease in uop. Will increase his diuresis. He is currently waiting SNF through the [place]. Will need outpatient ischemic evaluation through cardiology. -Continue Lipitor 40 mg -Increase furosemide 20 mg twice daily -Continue metoprolol succinate 12.5 mg daily - PT/OT: Pending placement at SNF  Dizziness Orthostatic hypotension Working well with PT, getting better at recognizing when he is losing balance and able to steady himself. -Pending SNF placement -Continue PT  UncontrolledType 2 diabeteswith Hyperglycemia Patient's fasting glucose this morning was 234. Mealtime blood sugars in 200s -Increase Lantus to 35 units nightly, NovoLog 3 units 3 times daily with meals -SSI  Liver cyst -Follow up outpatient  Diet: Heart Healthy IVF: None,None VTE: Enoxaparin Code: Full PT/OT recs: SNF for Subacute PT, none. TOC recs: [facility]  Dispo: Anticipated discharge to Skilled nursing facility in 1 days pending placement.   [name] 01/22/20 5:34 AM Pager [phone]  Please contact the on call pager after 5 pm and on weekends at [phone].

## 2020-01-23 DIAGNOSIS — Z720 Tobacco use: Secondary | ICD-10-CM

## 2020-01-23 LAB — GLUCOSE, CAPILLARY
Glucose-Capillary: 187 mg/dL — ABNORMAL HIGH (ref 70–99)
Glucose-Capillary: 159 mg/dL — ABNORMAL HIGH (ref 70–99)
Glucose-Capillary: 192 mg/dL — ABNORMAL HIGH (ref 70–99)
Glucose-Capillary: 196 mg/dL — ABNORMAL HIGH (ref 70–99)

## 2020-01-23 LAB — SARS CORONAVIRUS 2 BY RT PCR (HOSPITAL ORDER, PERFORMED IN ~~LOC~~ HOSPITAL LAB): SARS Coronavirus 2: NEGATIVE

## 2020-01-23 NOTE — TOC Progression Note (Signed)
Transition of Care [facility]) - Progression Note    Patient Details  Name: [name] MRN: [id number] Date of Birth: [birth year]  Transition of Care [facility]) CM/SW Contact  Joanne Chars, LCSW Phone Number: 01/23/2020, 8:27 AM  Clinical Narrative:   PASSR received: [phone] F    Expected Discharge Plan: Home/Self Care Barriers to Discharge: Continued Medical Work up  Expected Discharge Plan and Services Expected Discharge Plan: Home/Self Care In-house Referral: NA Discharge Planning Services: CM Consult   Living arrangements for the past 2 months: Single Family Home                 DME Arranged: N/A DME Agency: NA       HH Arranged: RN, PT, OT, Nurse's Aide [place] Agency: [place] ([facility]) Date HH Agency Contacted: 01/09/20 Time [place]: 1508 Representative spoke with at [place]: [place] ([place]) Interventions    Readmission Risk Interventions No flowsheet data found.

## 2020-01-23 NOTE — TOC Progression Note (Addendum)
Transition of Care [facility]) - Progression Note    Patient Details  Name: [name] MRN: [id number] Date of Birth: 05/24/1955  [facility]) CM/SW Contact  Joanne Chars, LCSW Phone Number: 01/23/2020, 10:05 AM  Clinical Narrative:   TC from [facility] (cell [phone]), [facility], [address] [place] [address]. [phone]. They can offer bed to pt today.  She needs specifics on the [facility] authorization and pt could come after 3pm today.  CSW spoke with Freddi Starr, [facility] and she will contact [place] regarding authorization. Wells Guiles also will contact [facility] transportation and work on setting that up.   1030: Phone call from [employer].  [facility] [place] is now saying they will not approve a veteran from [place] using one of their [place] facilities.  Will need to look at other non [facility] options.  1530: PT being again reviewed by Whitney at [facility].  Pt also referred to [facility] and is being reviewed by Danae Chen, no decision yet.    Expected Discharge Plan: Home/Self Care Barriers to Discharge: Continued Medical Work up  Expected Discharge Plan and Services Expected Discharge Plan: Home/Self Care In-house Referral: NA Discharge Planning Services: CM Consult   Living arrangements for the past 2 months: Single Family Home                 DME Arranged: N/A DME Agency: NA       HH Arranged: RN, PT, OT, Nurse's Aide [place] Agency: [place] ([facility]) Date HH Agency Contacted: 01/09/20 Time [place]: 1508 Representative spoke with at [place]: [place] ([place]) Interventions    Readmission Risk Interventions No flowsheet data found.

## 2020-01-23 NOTE — Progress Notes (Addendum)
HD#19 Subjective:  Overnight Events: No events overnight  Patient feeling well today. Reports increased urination on increased diuretic. States he is waiting to go to rehab. He was told that they they will find him a bed but he was informed that those plans fell through.   Objective:  Vital signs in last 24 hours: Vitals:   01/22/20 0915 01/22/20 2158 01/23/20 0500 01/23/20 0519  BP: 124/76 132/84  (!) 137/94  Pulse: 97 (!) 110  (!) 116  Resp: 18 18  19   Temp: 98.7 F (37.1 C) 97.7 F (36.5 C)  98.9 F (37.2 C)  TempSrc: Oral Oral  Oral  SpO2: 99% 94%  100%  Weight:   45.8 kg   Height:       Supplemental O2: Room Air SpO2: 100 %   Physical Exam:  Physical Exam Constitutional:      Appearance: Normal appearance.  HENT:     Head: Normocephalic and atraumatic.  Eyes:     Extraocular Movements: Extraocular movements intact.  Neck:     Comments: No JVD Cardiovascular:     Rate and Rhythm: Normal rate.     Pulses: Normal pulses.     Heart sounds: Normal heart sounds.  Pulmonary:     Effort: Pulmonary effort is normal.     Breath sounds: Normal breath sounds.  Abdominal:     General: Bowel sounds are normal.     Palpations: Abdomen is soft.     Tenderness: There is no abdominal tenderness.  Musculoskeletal:        General: Normal range of motion.     Cervical back: Normal range of motion.     Right lower leg: No edema.     Left lower leg: No edema.  Skin:    General: Skin is warm and dry.  Neurological:     General: No focal deficit present.     Mental Status: He is alert and oriented to person, place, and time. Mental status is at baseline.  Psychiatric:        Mood and Affect: Mood normal.    Filed Weights   01/21/20 2131 01/22/20 0104 01/23/20 0500  Weight: 75.9 kg 75.9 kg 45.8 kg     Intake/Output Summary (Last 24 hours) at 01/23/2020 0625 Last data filed at 01/22/2020 1715 Gross per 24 hour  Intake 360 ml  Output 300 ml  Net 60 ml   Net IO  Since Admission: -8,858 mL [01/23/20 0625]  Pertinent Labs: CBC Latest Ref Rng & Units 01/17/2020 01/07/2020 01/06/2020  WBC 4.0 - 10.5 K/uL 4.9 4.1 4.3  Hemoglobin 13.0 - 17.0 g/dL 14.4 12.7(L) 12.7(L)  Hematocrit 39 - 52 % 46.2 40.9 40.2  Platelets 150 - 400 K/uL 192 176 181    CMP Latest Ref Rng & Units 01/18/2020 01/17/2020 01/16/2020  Glucose 70 - 99 mg/dL - 142(H) 191(H)  BUN 8 - 23 mg/dL - 17 17  Creatinine 0.61 - 1.24 mg/dL 1.19 1.26(H) 1.34(H)  Sodium 135 - 145 mmol/L - 137 138  Potassium 3.5 - 5.1 mmol/L - 4.3 4.4  Chloride 98 - 111 mmol/L - 103 104  CO2 22 - 32 mmol/L - 25 23  Calcium 8.9 - 10.3 mg/dL - 9.5 9.4  Total Protein 6.5 - 8.1 g/dL - - -  Total Bilirubin 0.3 - 1.2 mg/dL - - -  Alkaline Phos 38 - 126 U/L - - -  AST 15 - 41 U/L - - -  ALT 0 -  44 U/L - - -    Imaging: No results found.  Assessment/Plan:   Principal Problem:   HFrEF (heart failure with reduced ejection fraction) (HCC) Active Problems:   HLD (hyperlipidemia)   Essential hypertension   Diabetes type 2, controlled (HCC)   Bilateral lower extremity edema   Shortness of breath   Tobacco use disorder   Obesity   Orthostatic hypotension   Pleural effusion due to congestive heart failure (HCC)   Aortic atherosclerosis (HCC)   Coronary artery disease   Hepatic cyst  Patient Summary: [name] is a 64 y.o. with a pertinent PMH of hypertension, type 2 diabetes on insulin, major depressive disorder, bipolar disorder, PTSD, renal cell carcinoma status post nephrectomy, chronic knee pain, who presented with bilateral lower extremity swelling and shortness of breath and admitted for new onset HFrEF complicated by large right transudative pleural effusion status post thoracentesis.   Acute heart failure with reduced EF(30-35%) (newly diagnosed) Transudative right pleural effusion s/p thoracentesis (11/20). CAD Aortic atherosclosis Chronic hypertension and hyperlipidemia Patient reports  increase uop overnight weight stable at 75 kg today. He is currently waiting SNF through the [place]. Will need outpatient ischemic evaluation through cardiology. -Continue Lipitor 40 mg -Continue furosemide 20 mg twice daily -Continue metoprolol succinate 12.5 mg daily - PT/OT: Pending placement at SNF  Dizziness Orthostatic hypotension Working well with PT, getting better at recognizing when he is losing balance and able to steady himself. -Pending SNF placement -Continue PT  UncontrolledType 2 diabeteswith Hyperglycemia Restarted on metformin yesterday. Fasting glucose 237 and mealtime glucose 150-180. -Continue Lantus to 35 units nightly, NovoLog 5 units 3 times daily with meals -SSI  Liver cyst -Follow up outpatient  Diet: Heart Healthy IVF: None,None VTE: Enoxaparin Code: Full PT/OT recs: SNF for Subacute PT, walker, beside commode, shower seat TOC recs: SNF pending placement through [place]  Dispo: Anticipated discharge to Skilled nursing facility in 1 days pending placement.   [name] 01/23/20 6:25 AM Pager [phone]  Please contact the on call pager after 5 pm and on weekends at [phone].

## 2020-01-23 NOTE — Progress Notes (Signed)
Physical Therapy Treatment Patient Details Name: [name] MRN: [id number] DOB: 1955/03/09 Today's Date: 01/23/2020    History of Present Illness 64yo male c/o DOE and BLE edema, CXR with volume overload and R pleural effusion. Significant systolic impairment found on ECHO. Negative for PE. PMH anxiety, CA, DM, h/o suicide attempt, HTN, HLD, osteoporosis    PT Comments    Pt making gradual progress.  Goals were continued or advanced.  Pt was able to ambulate with cane and performed stairs but requires min guard to min A for steadying.  He lives alone with nearly a flight of steps to enter his home.  Additionally, pt reports fall while hospitalized on 11/30.  Continue to recommend SNF as pt is fall risk, remains weak, and lives alone.     Follow Up Recommendations  SNF;Supervision/Assistance - 24 hour     Equipment Recommendations  Rolling walker with 5" wheels    Recommendations for Other Services       Precautions / Restrictions Precautions Precautions: Fall Precaution Comments: fell out of chair 11/30 (call bell didn't work per pt)    Mobility  Bed Mobility Overal bed mobility: Modified Independent             General bed mobility comments: use of bed rails  Transfers Overall transfer level: Needs assistance Equipment used: Rolling walker (2 wheeled) Transfers: Sit to/from Stand Sit to Stand: Min assist         General transfer comment: Cues for safe hand placement then min A to stand from low bed  Ambulation/Gait Ambulation/Gait assistance: Min guard Gait Distance (Feet): 250 Feet Assistive device: Straight cane Gait Pattern/deviations: Step-through pattern;Decreased stance time - left;Drifts right/left Gait velocity: decreased   General Gait Details: Min guard for safety ; advanced to cane today   Stairs Stairs: Yes Stairs assistance: Min assist Stair Management: One rail Left;With cane Number of Stairs: 8 General stair comments: Increased  time, min A to steady with going down   Wheelchair Mobility    Modified Rankin (Stroke Patients Only)       Balance Overall balance assessment: Needs assistance Sitting-balance support: No upper extremity supported;Feet supported Sitting balance-[name]: Normal     Standing balance support: Single extremity supported;No upper extremity supported Standing balance-[name]: Fair                              Cognition Arousal/Alertness: Awake/alert Behavior During Therapy: WFL for tasks assessed/performed Overall Cognitive Status: Within Functional Limits for tasks assessed                                 General Comments: Pt was appropriate, pleasant, AandOx4,and followed all commands.  Did not test cognition further.      Exercises      General Comments General comments (skin integrity, edema, etc.): VSS      Pertinent Vitals/Pain Pain Assessment: No/denies pain    Home Living                      Prior Function            PT Goals (current goals can now be found in the care plan section) Acute Rehab PT Goals Patient Stated Goal: agreeable to rehab-reports nervous about going home alone b/c of stairs and fall PT Goal Formulation: With patient Time For Goal Achievement: 02/06/20  Potential to Achieve Goals: Good Progress towards PT goals: Progressing toward goals    Frequency    Min 3X/week      PT Plan Current plan remains appropriate    Co-evaluation              AM-PAC PT "6 Clicks" Mobility   Outcome Measure  Help needed turning from your back to your side while in a flat bed without using bedrails?: None Help needed moving from lying on your back to sitting on the side of a flat bed without using bedrails?: None Help needed moving to and from a bed to a chair (including a wheelchair)?: A Little Help needed standing up from a chair using your arms (e.g., wheelchair or bedside chair)?: A  Little Help needed to walk in hospital room?: A Little Help needed climbing 3-5 steps with a railing? : A Little 6 Click Score: 20    End of Session Equipment Utilized During Treatment: Gait belt Activity Tolerance: Patient tolerated treatment well Patient left: with call bell/phone within reach;in bed Nurse Communication: Mobility status PT Visit Diagnosis: Unsteadiness on feet (R26.81);Muscle weakness (generalized) (M62.81);Difficulty in walking, not elsewhere classified (R26.2)     Time: 2671-2458 PT Time Calculation (min) (ACUTE ONLY): 14 min  Charges:  $Gait Training: 8-22 mins                     Abran Richard, PT Acute Rehab Services Pager [phone] [name] [place] 01/23/2020, 3:18 PM

## 2020-01-24 LAB — GLUCOSE, CAPILLARY
Glucose-Capillary: 315 mg/dL — ABNORMAL HIGH (ref 70–99)
Glucose-Capillary: 191 mg/dL — ABNORMAL HIGH (ref 70–99)
Glucose-Capillary: 123 mg/dL — ABNORMAL HIGH (ref 70–99)
Glucose-Capillary: 75 mg/dL (ref 70–99)

## 2020-01-24 MED ORDER — INSULIN GLARGINE 100 UNIT/ML ~~LOC~~ SOLN
40.0000 [IU] | Freq: Every day | SUBCUTANEOUS | Status: DC
  Administered 2020-01-24: 40 [IU] via SUBCUTANEOUS
  Filled 2020-01-24 (×2): qty 0.4

## 2020-01-24 NOTE — TOC Progression Note (Signed)
Transition of Care [facility]) - Progression Note    Patient Details  Name: Ethridge Sollenberger MRN: [id number] Date of Birth: 1956/02/03  Transition [facility] Care [facility]) CM/SW Contact  Joanne Chars, LCSW Phone Number: 01/24/2020, 3:21 PM  Clinical Narrative:   Whitney at [facility] reports they cannot extend bed offer.  CSW spoke again to [place] at [facility], she asked for additional information regarding pt mental health history, which was provided.  She is still discussing this pt with nursing staff and will call back as soon as she has an answer on possible bed offer.     Expected Discharge Plan: Home/Self Care Barriers to Discharge: Continued Medical Work up  Expected Discharge Plan and Services Expected Discharge Plan: Home/Self Care In-house Referral: NA Discharge Planning Services: CM Consult   Living arrangements for the past 2 months: Single Family Home                 DME Arranged: N/A DME Agency: NA       HH Arranged: RN,PT,OT,Nurse's Aide [place] Agency: [place] ([facility]) Date HH Agency Contacted: 01/09/20 Time [place]: 1508 Representative spoke with at [place]: [place] ([place]) Interventions    Readmission Risk Interventions No flowsheet data found.

## 2020-01-24 NOTE — Progress Notes (Signed)
Occupational Therapy Treatment Patient Details Name: [name] MRN: [id number] DOB: 05-03-1955 Today's Date: 01/24/2020    History of present illness 64yo male c/o DOE and BLE edema, CXR with volume overload and R pleural effusion. Significant systolic impairment found on ECHO. Negative for PE. PMH anxiety, CA, DM, h/o suicide attempt, HTN, HLD, osteoporosis   OT comments  Pt progressing towards acute OT goals. Focus of session was toilet transfer and functional mobility. Min A to boost up from low bed, otherwise min guard with toilet transfers and functional mobility utilizing rw this session. D/c plan remains appropriate.    Follow Up Recommendations  SNF;Supervision/Assistance - 24 hour    Equipment Recommendations  3 in 1 bedside commode;Tub/shower seat    Recommendations for Other Services      Precautions / Restrictions Precautions Precautions: Fall Precaution Comments: fell out of chair 11/30 (call bell didn't work per pt) Restrictions Weight Bearing Restrictions: No       Mobility Bed Mobility               General bed mobility comments: EOB at start and end of session  Transfers Overall transfer level: Needs assistance Equipment used: Rolling walker (2 wheeled) Transfers: Sit to/from Stand Sit to Stand: Min assist         General transfer comment: Cues for safe hand placement then min A to stand from low bed    Balance Overall balance assessment: Needs assistance Sitting-balance support: No upper extremity supported;Feet supported Sitting balance-[name]: Normal     Standing balance support: Single extremity supported;Bilateral upper extremity supported Standing balance-[name]: Fair Standing balance comment: BUE to come to standing, seeks single extremity support in standing                           ADL either performed or assessed with clinical judgement   ADL Overall ADL's : Needs assistance/impaired                          Toilet Transfer: Min guard;RW;Ambulation;Regular Toilet;Grab bars Toilet Transfer Details (indicate cue type and reason): minguard for safety         Functional mobility during ADLs: Min guard;Supervision/safety;Rolling walker General ADL Comments: Pt completed toilet transfer, pericare. seated rest break EOB then pt asking to walk in the halls. Walked community distance at decreased speed, up to min guard for safety and used rw.     Vision       Perception     Praxis      Cognition Arousal/Alertness: Awake/alert Behavior During Therapy: WFL for tasks assessed/performed Overall Cognitive Status: No family/caregiver present to determine baseline cognitive functioning Area of Impairment: Problem solving;Safety/judgement;Memory                     Memory: Decreased short-term memory   Safety/Judgement: Decreased awareness of safety;Decreased awareness of deficits Awareness: Emergent Problem Solving: Slow processing;Requires verbal cues General Comments: Verbal perseveration noted. plesant and conversational. Tangential at times.        Exercises     Shoulder Instructions       General Comments      Pertinent Vitals/ Pain       Pain Assessment: No/denies pain  Home Living  Prior Functioning/Environment              Frequency  Min 2X/week        Progress Toward Goals  OT Goals(current goals can now be found in the care plan section)  Progress towards OT goals: Progressing toward goals  Acute Rehab OT Goals Patient Stated Goal: agreeable to rehab-reports nervous about going home alone b/c of stairs and fall OT Goal Formulation: With patient Time For Goal Achievement: 02/07/20 Potential to Achieve Goals: Good ADL Goals Pt Will Perform Grooming: Independently;standing Pt Will Perform Upper Body Bathing: Independently;sitting Pt Will Perform Lower Body Bathing:  Independently;sit to/from stand Pt Will Perform Upper Body Dressing: Independently;sitting Pt Will Perform Lower Body Dressing: Independently;sit to/from stand Pt Will Transfer to Toilet: with modified independence;ambulating;bedside commode Pt Will Perform Toileting - Clothing Manipulation and hygiene: Independently;sit to/from stand  Plan Discharge plan remains appropriate;Frequency remains appropriate    Co-evaluation                 AM-PAC OT "6 Clicks" Daily Activity     Outcome Measure   Help from another person eating meals?: None Help from another person taking care of personal grooming?: A Little Help from another person toileting, which includes using toliet, bedpan, or urinal?: A Little Help from another person bathing (including washing, rinsing, drying)?: A Little Help from another person to put on and taking off regular upper body clothing?: None Help from another person to put on and taking off regular lower body clothing?: A Little 6 Click Score: 20    End of Session Equipment Utilized During Treatment: Rolling walker  OT Visit Diagnosis: Unsteadiness on feet (R26.81);Other abnormalities of gait and mobility (R26.89);Muscle weakness (generalized) (M62.81);Dizziness and giddiness (R42)   Activity Tolerance Patient tolerated treatment well   Patient Left in bed;with call bell/phone within reach;with bed alarm set;Other (comment) (sitting EOB)   Nurse Communication          Time: 9326-7124 OT Time Calculation (min): 32 min  Charges: OT General Charges $OT Visit: 1 Visit OT Treatments $Self Care/Home Management : 23-37 mins  Tyrone Schimke, OT Acute Rehabilitation Services Pager: [phone] Office: [phone]    Hortencia Pilar 01/24/2020, 2:01 PM

## 2020-01-24 NOTE — Progress Notes (Addendum)
HD#20 Subjective:  Overnight Events: No events overnight Patient states the news about [place] not willing to take him made him feel bad. He called [facility] and they said they will not take him. [facility] [place] since [facility] is located in that area, and discussed possibility of going to [place] in [place] for impatient rehab. His PCP will have to sign some forms for him to be accepted there. States he walked with the nursing staff yesterday. He had an episode of SOB that resolved quickly yesterday.   Objective:  Vital signs in last 24 hours: Vitals:   01/23/20 1541 01/23/20 2216 01/24/20 0400 01/24/20 0522  BP: (!) 136/91 (!) 149/89  121/78  Pulse: (!) 109 (!) 124 (!) 117 (!) 118  Resp: (!) 22 18 18 20   Temp: 97.7 F (36.5 C) 97.6 F (36.4 C)  98 F (36.7 C)  TempSrc: Oral Oral  Oral  SpO2: 98% 97% 98% 98%  Weight:      Height:       Supplemental O2: Room Air SpO2: 98 %   Physical Exam:  Physical Exam Constitutional:      Appearance: Normal appearance.  HENT:     Head: Normocephalic and atraumatic.  Eyes:     Extraocular Movements: Extraocular movements intact.  Neck:     Comments: No JVD Cardiovascular:     Rate and Rhythm: Normal rate.     Pulses: Normal pulses.     Heart sounds: Normal heart sounds.  Pulmonary:     Effort: Pulmonary effort is normal.     Breath sounds: Normal breath sounds.  Abdominal:     General: Bowel sounds are normal.     Palpations: Abdomen is soft.     Tenderness: There is no abdominal tenderness.  Musculoskeletal:        General: Normal range of motion.     Cervical back: Normal range of motion.     Right lower leg: No edema.     Left lower leg: No edema.  Skin:    General: Skin is warm and dry.  Neurological:     General: No focal deficit present.     Mental Status: He is alert and oriented to person, place, and time. Mental status is at baseline.  Psychiatric:        Mood and Affect: Mood  normal.    Filed Weights   01/21/20 2131 01/22/20 0104 01/23/20 0500  Weight: 75.9 kg 75.9 kg 45.8 kg     Intake/Output Summary (Last 24 hours) at 01/24/2020 0618 Last data filed at 01/23/2020 1700 Gross per 24 hour  Intake 480 ml  Output 400 ml  Net 80 ml   Net IO Since Admission: -8,778 mL [01/24/20 0618]  Pertinent Labs: CBC Latest Ref Rng & Units 01/17/2020 01/07/2020 01/06/2020  WBC 4.0 - 10.5 K/uL 4.9 4.1 4.3  Hemoglobin 13.0 - 17.0 g/dL 14.4 12.7(L) 12.7(L)  Hematocrit 39.0 - 52.0 % 46.2 40.9 40.2  Platelets 150 - 400 K/uL 192 176 181    CMP Latest Ref Rng & Units 01/18/2020 01/17/2020 01/16/2020  Glucose 70 - 99 mg/dL - 142(H) 191(H)  BUN 8 - 23 mg/dL - 17 17  Creatinine 0.61 - 1.24 mg/dL 1.19 1.26(H) 1.34(H)  Sodium 135 - 145 mmol/L - 137 138  Potassium 3.5 - 5.1 mmol/L - 4.3 4.4  Chloride 98 - 111 mmol/L - 103 104  CO2 22 - 32 mmol/L -  25 23  Calcium 8.9 - 10.3 mg/dL - 9.5 9.4  Total Protein 6.5 - 8.1 g/dL - - -  Total Bilirubin 0.3 - 1.2 mg/dL - - -  Alkaline Phos 38 - 126 U/L - - -  AST 15 - 41 U/L - - -  ALT 0 - 44 U/L - - -    Imaging: No results found.  Assessment/Plan:   Principal Problem:   HFrEF (heart failure with reduced ejection fraction) (HCC) Active Problems:   HLD (hyperlipidemia)   Essential hypertension   Diabetes type 2, controlled (HCC)   Bilateral lower extremity edema   Shortness of breath   Tobacco use disorder   Obesity   Orthostatic hypotension   Pleural effusion due to congestive heart failure (HCC)   Aortic atherosclerosis (HCC)   Coronary artery disease   Hepatic cyst  Patient Summary: [name] is a 64 y.o. with a pertinent PMH of hypertension, type 2 diabetes on insulin, major depressive disorder, bipolar disorder, PTSD, renal cell carcinoma status post nephrectomy, chronic knee pain, who presented with bilateral lower extremity swelling and shortness of breath and admitted for new onset HFrEF complicated by large  right transudative pleural effusion status post thoracentesis.   Acute heart failure with reduced EF(30-35%) (newly diagnosed) Transudative right pleural effusion s/p thoracentesis (11/20). CAD Aortic atherosclosis Chronic hypertension and hyperlipidemia Weight remains stable at 75 kg today. On exam chest CTA no lower extremity edema, appears to be euvolemic on current dose of diuretic.  He is currently waiting SNF through the [place]. Will need outpatient ischemic evaluation through cardiology. -Continue Lipitor 40 mg -Continue furosemide 20 mg twice daily -Continue metoprolol succinate 12.5 mg daily - PT/OT: Pending placement at SNF  Dizziness Orthostatic hypotension Working well with PT, getting better at recognizing when he is losing balance and able to steady himself. -Pending SNF placement -Continue PT  UncontrolledType 2 diabeteswith Hyperglycemia Restarted on metformin yesterday. Fasting glucose 196 and mealtime glucose 150-180. -Increase Lantus to 40 units nightly, NovoLog 5 units 3 times daily with meals -SSI  Liver cyst -Follow up outpatient  Diet: Heart Healthy IVF: None,None VTE: Enoxaparin Code: Full PT/OT recs: SNF for Subacute PT, walker, beside commode, shower seat TOC recs: SNF pending placement through [place]  Dispo: Anticipated discharge to Skilled nursing facility in 1 days pending placement.   [name] 01/24/20 6:18 AM Pager [phone]  Please contact the on call pager after 5 pm and on weekends at [phone].

## 2020-01-25 LAB — SARS CORONAVIRUS 2 BY RT PCR (HOSPITAL ORDER, PERFORMED IN ~~LOC~~ HOSPITAL LAB): SARS Coronavirus 2: NEGATIVE

## 2020-01-25 LAB — BASIC METABOLIC PANEL
Anion gap: 13 (ref 5–15)
BUN: 21 mg/dL (ref 8–23)
CO2: 25 mmol/L (ref 22–32)
Calcium: 9.7 mg/dL (ref 8.9–10.3)
Chloride: 99 mmol/L (ref 98–111)
Creatinine, Ser: 1.14 mg/dL (ref 0.61–1.24)
GFR, Estimated: >60 mL/min (ref >60)
Glucose, Bld: 75 mg/dL (ref 70–99)
Potassium: 3.7 mmol/L (ref 3.5–5.1)
Sodium: 137 mmol/L (ref 135–145)

## 2020-01-25 LAB — GLUCOSE, CAPILLARY
Glucose-Capillary: 204 mg/dL — ABNORMAL HIGH (ref 70–99)
Glucose-Capillary: 125 mg/dL — ABNORMAL HIGH (ref 70–99)
Glucose-Capillary: 137 mg/dL — ABNORMAL HIGH (ref 70–99)

## 2020-01-25 LAB — CREATININE, SERUM
Creatinine, Ser: 1.19 mg/dL (ref 0.61–1.24)
GFR, Estimated: >60 mL/min (ref >60)

## 2020-01-25 LAB — MAGNESIUM: Magnesium: 1.4 mg/dL — ABNORMAL LOW (ref 1.7–2.4)

## 2020-01-25 MED ORDER — MAGNESIUM SULFATE 2 GM/50ML IV SOLN: 2.0000 g | Freq: Once | INTRAVENOUS | Status: DC

## 2020-01-25 MED ORDER — FUROSEMIDE 20 MG PO TABS
20.0000 mg | ORAL_TABLET | Freq: Two times a day (BID) | ORAL | 0 refills | Status: DC
Start: 2020-01-25 — End: 2020-10-07

## 2020-01-25 MED ORDER — MAGNESIUM CHLORIDE 64 MG PO TBEC
2.0000 | DELAYED_RELEASE_TABLET | Freq: Once | ORAL | Status: AC
  Administered 2020-01-25: 128 mg via ORAL
  Filled 2020-01-25: qty 2

## 2020-01-25 MED ORDER — ARIPIPRAZOLE 10 MG PO TABS
10.0000 mg | ORAL_TABLET | Freq: Every day | ORAL | 0 refills | Status: DC
Start: 2020-01-25 — End: 2020-10-07

## 2020-01-25 MED ORDER — METOPROLOL SUCCINATE ER 25 MG PO TB24
12.5000 mg | ORAL_TABLET | Freq: Every day | ORAL | 0 refills | Status: DC
Start: 2020-01-26 — End: 2020-10-11

## 2020-01-25 MED ORDER — INSULIN GLARGINE 100 UNIT/ML ~~LOC~~ SOLN: 40.0000 [IU] | Freq: Every day | SUBCUTANEOUS | 11 refills | Status: DC

## 2020-01-25 MED ORDER — DULOXETINE HCL 20 MG PO CPEP
20.0000 mg | ORAL_CAPSULE | Freq: Every day | ORAL | 3 refills | Status: DC
Start: 2020-01-25 — End: 2020-10-07

## 2020-01-25 MED ORDER — MAGNESIUM CHLORIDE 64 MG PO TBEC: 1.0000 | DELAYED_RELEASE_TABLET | Freq: Once | ORAL | Status: DC

## 2020-01-25 NOTE — TOC Progression Note (Signed)
Transition of Care [facility]) - Progression Note    Patient Details  Name: [name] MRN: [id number] Date of Birth: 1955-11-10  Transition [facility]) CM/SW Contact  Joanne Chars, LCSW Phone Number: 01/25/2020, 9:56 AM  Clinical Narrative:   Danae Chen at [facility] reports that they will be able to offer a bed once they reach [facility] and receive confirmation of the SNF authorization.  CSW called Marda Stalker at [facility] and LM asking her to call Melene Plan at [facility] and CSW also inquired about again setting up [place] transportation to the facility.     Expected Discharge Plan: Home/Self Care Barriers to Discharge: Continued Medical Work up  Expected Discharge Plan and Services Expected Discharge Plan: Home/Self Care In-house Referral: NA Discharge Planning Services: CM Consult   Living arrangements for the past 2 months: Single Family Home                 DME Arranged: N/A DME Agency: NA       HH Arranged: RN,PT,OT,Nurse's Aide [place] Agency: [facility] ([facility]) Date HH Agency Contacted: 01/09/20 Time [place]: 1508 Representative spoke with at [place]: [place]) Interventions    Readmission Risk Interventions No flowsheet data found.

## 2020-01-25 NOTE — TOC Transition Note (Signed)
Transition of Care [facility]) - CM/SW Discharge Note   Patient Details  Name: [name] MRN: [id number] Date of Birth: 05-02-1955  [facility] of [facility]) CM/SW Contact:  Joanne Chars, LCSW Phone Number: 01/25/2020, 3:55 PM   Clinical Narrative:   Pt discharging to [facility], [address] [place], [place], [place] [address].  RN call report to [phone] and ask for 100 hall RN.  Transportation to be provided by [facility] and scheduled to be at [facility] around [place] contact [phone].    Final next level of care: Skilled Nursing Facility Barriers to Discharge: Barriers Resolved   Patient Goals and CMS Choice Patient states their goals for this hospitalization and ongoing recovery are:: Wants to get better and go home CMS Medicare.gov Compare Post Acute Care list provided to:: Patient Choice offered to / list presented to : Patient  Discharge Placement              Patient chooses bed at:  [facility]) Patient to be transferred to facility by: [facility] Transportation   Patient and family notified of of transfer: 01/25/20  Discharge Plan and Services In-house Referral: NA Discharge Planning Services: CM Consult            DME Arranged: N/A DME Agency: NA       HH Arranged: RN,PT,OT,Nurse's Aide [place] Agency: [place] ([place]) Date HH Agency Contacted: 01/09/20 Time [place]: 1508 Representative spoke with at [place]: [place] Determinants of Health ([name]) Interventions     Readmission Risk Interventions No flowsheet data found.

## 2020-01-25 NOTE — Progress Notes (Addendum)
Attempted to call report to [facility] with no answer.  1721: attempted to call report again with no answer

## 2020-01-25 NOTE — Discharge Summary (Addendum)
Name: [name] MRN: [id number] DOB: 01-26-1956 64 y.o. PCP: Patient, No Pcp Per  Date of Admission: 01/04/2020  8:18 AM Date of Discharge:  Attending Physician: Lucious Groves, DO  Discharge Diagnosis: 1. New onset heart failure with reduced ejection fraction 2. Pleural effusion  3. Orthostatic hypertension 4. Type 2 Diabetes mellitus with Hypoglycemia 5. Hypertension 6. Hyperlipidemia 6. Hepatic cyst 7. CAD  Discharge Medications: Allergies as of 01/25/2020       Reactions   Morphine And Related Other (See Comments)   Pt unable to take narcotics.         Medication List     STOP taking these medications    clotrimazole 1 % external solution Commonly known as: LOTRIMIN   glipiZIDE 10 MG tablet Commonly known as: GLUCOTROL   hydrochlorothiazide 25 MG tablet Commonly known as: HYDRODIURIL   lisinopril 20 MG tablet Commonly known as: ZESTRIL   SYSTANE OP   traZODone 50 MG tablet Commonly known as: DESYREL       TAKE these medications    ARIPiprazole 10 MG tablet Commonly known as: ABILIFY Take 10 mg by mouth daily. Takes one-half tablet in the morning What changed: Another medication with the same name was added. Make sure you understand how and when to take each.   ARIPiprazole 10 MG tablet Commonly known as: ABILIFY Take 1 tablet (10 mg total) by mouth daily. What changed: You were already taking a medication with the same name, and this prescription was added. Make sure you understand how and when to take each.   atorvastatin 40 MG tablet Commonly known as: LIPITOR Take 1 tablet (40 mg total) by mouth daily. What changed: how much to take   DULoxetine 20 MG capsule Commonly known as: CYMBALTA Take 20 mg by mouth daily. What changed: Another medication with the same name was added. Make sure you understand how and when to take each.   DULoxetine 20 MG capsule Commonly known as: CYMBALTA Take 1 capsule (20 mg total) by mouth  daily. What changed: You were already taking a medication with the same name, and this prescription was added. Make sure you understand how and when to take each.   furosemide 20 MG tablet Commonly known as: LASIX Take 1 tablet (20 mg total) by mouth 2 (two) times daily.   gabapentin 600 MG tablet Commonly known as: NEURONTIN Take 600 mg by mouth daily as needed (right knee pain). What changed: Another medication with the same name was removed. Continue taking this medication, and follow the directions you see here.   insulin glargine 100 UNIT/ML injection Commonly known as: LANTUS Inject 0.4 mLs (40 Units total) into the skin at bedtime. What changed: how much to take   lamoTRIgine 200 MG tablet Commonly known as: LAMICTAL Take 200 mg by mouth 2 (two) times daily. One-half tablet twice daily What changed: Another medication with the same name was removed. Continue taking this medication, and follow the directions you see here.   metFORMIN 1000 MG tablet Commonly known as: GLUCOPHAGE TAKE ONE TABLET BY MOUTH TWICE DAILY WITH MEAL   metoprolol succinate 25 MG 24 hr tablet Commonly known as: TOPROL-XL Take 0.5 tablets (12.5 mg total) by mouth daily. Start taking on: January 26, 2020   multivitamin with minerals Tabs tablet Take 1 tablet by mouth daily.   nicotine polacrilex 2 MG lozenge Commonly known as: COMMIT Take 2 mg by mouth [name] 2 (two) hours as needed for smoking cessation.  Durable Medical Equipment  (From admission, onward)           Start     Ordered   01/25/20 1508  DME 3-in-1  Once        01/25/20 1507   01/25/20 1508  DME Tub Bench  Once        01/25/20 1507   01/25/20 1508  DME Walker  Once       Question Answer Comment  Walker: With [place] Wheels   Patient needs a walker to treat with the following condition Dizziness      01/25/20 1507   01/09/20 1453  For home use only DME 3 n 1  Once        01/09/20 1452             Disposition and follow-up:   [name] was discharged from [facility] in Stable condition.  At the hospital follow up visit please address:  1.  PCP:  Adjust diabetes medications, consider starting SGLT2 in the outpatient setting. Assess PTSD, depression and adjust medications Repeat moca  Found to have liver cyst on imaging, repeat imaging for hepatic cyst  Cardiology: CHF and ischemic workup for heart failure  2.  Labs / imaging needed at time of follow-up: CBC, BMP, mg  3.  Pending labs/ test needing follow-up: none  Follow-up Appointments:  Follow-up Information     [facility] Follow up.   Why: Your home health has been set up with [facility]. The agency will contact you with a start of service date. If you have any questions please call number listed above Contact information: [place] by problem list: [name] is a 64 year old male with past medical history of hypertension, type 2 diabetes on insulin, major depressive disorder, bipolar disorder, PTSD, renal cell carcinoma s/p nephrectomy, chronic knee pain who admitted hospital for bilateral lower extremity swelling and shortness of breath, found to have new onset HFrEF complicated by transudate of effusion.  HFrEF complicated by transudative effusion requiring thoracentesis Patient presented with lower extremity edema and shortness of breath and found to have a new diagnosis of heart failure with reduced ejection fraction complicated by right pleural effusion. Patient had a thoracentesis with removal of 700 cc.  Laboratory analysis showed transudate of effusion.  Heart failure was consulted and started guideline directed therapy which included Entresto and Coreg.  Patient ultimately had a complication resulting in orthostatic hypotension.  Neuro evaluation was negative for stroke.  Dizziness thought to be secondary to orthostatic  hypotension in the setting of new medications.  [name] was held and patient was switched to metoprolol succinate with improvement of his blood pressure and gradual improvement of his dizziness.  Ultimately patient was diuresed almost 8 L with a new dry weight of around 183 pounds.  Cardiology recommended outpatient ischemic evaluation to further evaluate possible CAD  2.  CAD/hyperlipidemia Patient was continued on statin medication and counseled on the importance of smoking cessation.  4.  Diabetes Patient takes Lantus 50 units daily and glipizide for home regimen for diabetes.  Patient has experienced some episodes of hypoglycemia during the hospital requiring reduced dosages of insulin.  We will discharge him on Lantus 40 units nightly.  We will not restart glipizide at discharge due to these hypoglycemic spells.  He will need reevaluation of his diabetic medication regimen at follow-up.  Consider starting SGLT2 in the outpatient setting..  5. Major depressive disorder/bipolar disorder/posttraumatic stress disorder: Patient was continued on home Abilify, duloxetine, Lamictal.  6.  Cognitive impairment: Concern for cognitive impairment during this hospitalization with a Moca of 14. He will need his Moca repeated in the outpatient setting once his medical issues have been stabilized   Discharge Vitals:   BP 136/90 (BP Location: Left Arm)   Pulse (!) 101   Temp 98 F (36.7 C)   Resp 18   Ht 5\' 10"  (1.778 m)   Wt 75.7 kg   SpO2 98%   BMI 23.95 kg/m   Pertinent Labs, Studies, and Procedures:  CT Angio Chest PE W and/or Wo Contrast  Result Date: 01/04/2020 CLINICAL DATA:  Shortness of breath.  Chest pain. EXAM: CT ANGIOGRAPHY CHEST WITH CONTRAST TECHNIQUE: Multidetector CT imaging of the chest was performed using the standard protocol during bolus administration of intravenous contrast. Multiplanar CT image reconstructions and MIPs were obtained to evaluate the vascular anatomy.  CONTRAST:  8mL OMNIPAQUE IOHEXOL 350 MG/ML SOLN COMPARISON:  Radiography same day FINDINGS: Cardiovascular: Heart size is normal. Mild aortic atherosclerotic calcification is noted. Some coronary artery calcification, particularly in the left system. Pulmonary arterial opacification is good. There are no pulmonary emboli. Mediastinum/Nodes: No mediastinal or hilar mass or lymphadenopathy. Lungs/Pleura: Moderate to large effusion on the right, layering dependently. Dependent pulmonary atelectasis. Subtotal right lower lobe collapse. Non dependent lung is clear. No abnormality on the left. Upper Abdomen: Nonspecific 1 cm low-density in the lateral aspect of the right lobe of liver. As an isolated finding, this is most consistent with a cyst or hemangioma. This is not completely evaluated. Musculoskeletal: Normal Review of the MIP images confirms the above findings. IMPRESSION: 1. No pulmonary emboli. 2. Moderate to large right pleural effusion layering dependently. Dependent pulmonary atelectasis. Subtotal right lower lobe collapse. 3. Aortic atherosclerosis. Coronary artery calcification, particularly in the left system. 4. 1 cm low-density in the lateral aspect of the right lobe of the liver, not completely evaluated. As an isolated finding, this is most consistent with a cyst or hemangioma. Aortic Atherosclerosis (ICD10-I70.0). Electronically Signed   By: Nelson Chimes M.D.   On: 01/04/2020 12:39   DG Chest Port 1 View  Result Date: 01/05/2020 CLINICAL DATA:  Status post right thoracentesis. EXAM: PORTABLE CHEST 1 VIEW COMPARISON:  01/04/2020 FINDINGS: Improved aeration at the right lung base. Negative for a pneumothorax. Heart remains enlarged. No significant pulmonary edema. IMPRESSION: 1. Improved aeration in the right lung following the thoracentesis. 2. Negative for pneumothorax. 3. Stable cardiomegaly. Electronically Signed   By: Markus Daft M.D.   On: 01/05/2020 14:05   DG Chest Portable 1  View  Result Date: 01/04/2020 CLINICAL DATA:  Shortness of breath EXAM: PORTABLE CHEST 1 VIEW COMPARISON:  06/17/2011 FINDINGS: Cardiomegaly and vascular pedicle widening. Hazy bilateral pulmonary opacity at the bases. There is elevated right diaphragm. No visible effusion or pneumothorax. IMPRESSION: Hazy opacity at the bases from infiltrates or edema (the opacity is symmetric and there is cardiomegaly). Electronically Signed   By: Monte Fantasia M.D.   On: 01/04/2020 09:39   ECHOCARDIOGRAM COMPLETE  Result Date: 01/05/2020    ECHOCARDIOGRAM REPORT   Patient Name:   [name] Date of Exam: 01/05/2020 Medical Rec #:  [id number]        Height:       70.0 in Accession #:    [id number]       Weight:  212.1 lb Date of Birth:  Aug 26, 1955        BSA:          2.140 m Patient Age:    24 years         BP:           142/79 mmHg Patient Gender: M                HR:           105 bpm. Exam Location:  Inpatient Procedure: 2D Echo Indications:    elevated troponin  History:        Patient has no prior history of Echocardiogram examinations.                 Abnormal ECG, Signs/Symptoms:Shortness of Breath and lower                 extremity edema; Risk Factors:Diabetes.  Sonographer:    Johny Chess Referring Phys: [id number] [place]  1. Diffuse hypokinesis worse in the inferior base. Left ventricular ejection fraction, by estimation, is 25 to 30%. The left ventricle has severely decreased function. The left ventricle demonstrates global hypokinesis. The left ventricular internal cavity size was mildly dilated. Left ventricular diastolic parameters were normal.  2. Right ventricular systolic function is normal. The right ventricular size is normal. There is mildly elevated pulmonary artery systolic pressure.  3. Left atrial size was moderately dilated.  4. Right atrial size was mildly dilated.  5. The mitral valve is abnormal. Moderate mitral valve regurgitation. No evidence of mitral  stenosis.  6. Tricuspid valve regurgitation is moderate.  7. The aortic valve is tricuspid. Aortic valve regurgitation is not visualized. Mild aortic valve sclerosis is present, with no evidence of aortic valve stenosis.  8. The inferior vena cava is dilated in size with <50% respiratory variability, suggesting right atrial pressure of 15 mmHg. FINDINGS  Left Ventricle: Diffuse hypokinesis worse in the inferior base. Left ventricular ejection fraction, by estimation, is 25 to 30%. The left ventricle has severely decreased function. The left ventricle demonstrates global hypokinesis. The left ventricular  internal cavity size was mildly dilated. There is no left ventricular hypertrophy. Left ventricular diastolic parameters were normal. Right Ventricle: The right ventricular size is normal. No increase in right ventricular wall thickness. Right ventricular systolic function is normal. There is mildly elevated pulmonary artery systolic pressure. The tricuspid regurgitant velocity is 2.65  m/s, and with an assumed right atrial pressure of 8 mmHg, the estimated right ventricular systolic pressure is 14.7 mmHg. Left Atrium: Left atrial size was moderately dilated. Right Atrium: Right atrial size was mildly dilated. Pericardium: There is no evidence of pericardial effusion. Mitral Valve: The mitral valve is abnormal. Moderate mitral valve regurgitation. No evidence of mitral valve stenosis. Tricuspid Valve: The tricuspid valve is normal in structure. Tricuspid valve regurgitation is moderate . No evidence of tricuspid stenosis. Aortic Valve: The aortic valve is tricuspid. Aortic valve regurgitation is not visualized. Mild aortic valve sclerosis is present, with no evidence of aortic valve stenosis. Pulmonic Valve: The pulmonic valve was normal in structure. Pulmonic valve regurgitation is not visualized. No evidence of pulmonic stenosis. Aorta: The aortic root is normal in size and structure. Venous: The inferior vena  cava is dilated in size with less than 50% respiratory variability, suggesting right atrial pressure of 15 mmHg. IAS/Shunts: No atrial level shunt detected by color flow Doppler.  LEFT VENTRICLE PLAX 2D LVIDd:  5.30 cm LVIDs:         4.40 cm LV PW:         1.10 cm LV IVS:        0.70 cm LVOT diam:     2.10 cm LV SV:         36 LV SV Index:   17 LVOT Area:     3.46 cm  LV Volumes (MOD) LV vol d, MOD A2C: 116.0 ml LV vol d, MOD A4C: 162.0 ml LV vol s, MOD A2C: 83.4 ml LV vol s, MOD A4C: 115.0 ml LV SV MOD A2C:     32.6 ml LV SV MOD A4C:     162.0 ml LV SV MOD BP:      44.2 ml RIGHT VENTRICLE             IVC RV S prime:     12.40 cm/s  IVC diam: 2.30 cm TAPSE (M-mode): 1.5 cm LEFT ATRIUM             Index       RIGHT ATRIUM           Index LA diam:        4.80 cm 2.24 cm/m  RA Area:     20.40 cm LA Vol (A2C):   93.4 ml 43.64 ml/m RA Volume:   59.20 ml  27.66 ml/m LA Vol (A4C):   67.7 ml 31.64 ml/m LA Biplane Vol: 79.2 ml 37.01 ml/m  AORTIC VALVE LVOT Vmax:   72.50 cm/s LVOT Vmean:  46.600 cm/s LVOT VTI:    0.104 m  AORTA Ao Root diam: 2.80 cm Ao Asc diam:  3.50 cm MITRAL VALVE                 TRICUSPID VALVE MV Area (PHT): 4.80 cm      TR Peak grad:   28.1 mmHg MV Decel Time: 158 msec      TR Vmax:        265.00 cm/s MR Peak grad:    89.9 mmHg MR Mean grad:    55.0 mmHg   SHUNTS MR Vmax:         474.00 cm/s Systemic VTI:  0.10 m MR Vmean:        343.0 cm/s  Systemic Diam: 2.10 cm MR PISA:         1.01 cm MR PISA Eff ROA: 8 mm MR PISA Radius:  0.40 cm MV E velocity: 97.40 cm/s MV A velocity: 76.60 cm/s MV E/A ratio:  1.27 Jenkins Rouge MD Electronically signed by Jenkins Rouge MD Signature Date/Time: 01/05/2020/11:24:14 AM    Final    US THORACENTESIS ASP PLEURAL SPACE W/IMG GUIDE  Result Date: 01/05/2020 INDICATION: Patient with history of new onset systolic HF, dyspnea, and right pleural effusion. Request is made for diagnostic and therapeutic right thoracentesis. EXAM: ULTRASOUND GUIDED DIAGNOSTIC  AND THERAPEUTIC RIGHT THORACENTESIS MEDICATIONS: 8 mL 1% lidocaine COMPLICATIONS: None immediate. PROCEDURE: An ultrasound guided thoracentesis was thoroughly discussed with the patient and questions answered. The benefits, risks, alternatives and complications were also discussed. The patient understands and wishes to proceed with the procedure. Written consent was obtained. Ultrasound was performed to localize and mark an adequate pocket of fluid in the right chest. The area was then prepped and draped in the normal sterile fashion. 1% Lidocaine was used for local anesthesia. Under ultrasound guidance a 6 Fr Safe-T-Centesis catheter was introduced by Dr. Anselm Pancoast. Thoracentesis was performed. The catheter was  removed and a dressing applied. FINDINGS: A total of approximately 750 mL of hazy gold fluid was removed. Samples were sent to the laboratory as requested by the clinical team. IMPRESSION: Successful ultrasound guided right thoracentesis yielding 750 mL of pleural fluid. Read by: Earley Abide, PA-C Electronically Signed   By: Markus Daft M.D.   On: 01/05/2020 14:05    Discharge Instructions: Discharge Instructions     Diet - low sodium heart healthy   Complete by: As directed    Discharge instructions   Complete by: As directed    You were hospitalized for congestive heart failure with lung effusion. Thank you for allowing [place] to be part of your care.    Please follow up with the following providers: Please follow up with cardiology  Please note these changes made to your medications:   - Medications to continue: Continue you home medications  - Medications to start: Furosemide 20 mg twice daily metoprolol 25 mg daily  - Medications to discontinue:  clotrimazole, glipizide, hydrochlorothiazide, lisinopril, and trazodone   Please call our clinic if you have any questions or concerns, we may be able to help and keep you from a long and expensive emergency room wait. Our clinic and after  hours phone number is [phone], the best time to call is Monday through Friday 9 am to 4 pm but there is always someone available 24/7 if you have an emergency. If you need medication refills please notify your pharmacy one week in advance and they will send [place] a request.   Increase activity slowly   Complete by: As directed        Signed: [name], MD 01/25/2020, 3:08 PM   Pager: [phone]

## 2020-01-25 NOTE — Progress Notes (Deleted)
TCT [employer], she stated that she contacted the patient's Nephew yesterday and he plans to visit the patient today, he will call Tillie Rung so that she can screen the patient to determine what he will qualify for. Olga Coaster RN,MHA,BSM Transition of Care Supervisor [phone]

## 2020-01-25 NOTE — Progress Notes (Signed)
HD#21 Subjective:  Overnight Events: No events overnight Patient reports 2 episodes of SOB yesterday. Feels suddenly short of breath lasting about 2-3 minutes before going away. Denies feeling dizziness, chest pain, nausea during this episode. This has happed while up and walking with PT as well as when laying in bed on the phone. Discussed patient bring it up with PT if this happens again. Otherwise feeling well and awaiting SNF placement.  Objective:  Vital signs in last 24 hours: Vitals:   01/24/20 1516 01/24/20 2122 01/25/20 0509 01/25/20 0600  BP: (!) 145/82 120/65 136/88   Pulse: (!) 120 (!) 112 61   Resp: (!) 22 20 18    Temp: 98.9 F (37.2 C) 97.7 F (36.5 C) (!) 97.4 F (36.3 C)   TempSrc: Oral Oral Oral   SpO2: 100% 97% 100%   Weight:    75.7 kg  Height:       Supplemental O2: Room Air SpO2: 100 %   Physical Exam:  Physical Exam Constitutional:      Appearance: Normal appearance.  Eyes:     Extraocular Movements: Extraocular movements intact.  Neck:     Comments: No JVD Cardiovascular:     Rate and Rhythm: Normal rate.     Pulses: Normal pulses.     Heart sounds: Normal heart sounds.  Pulmonary:     Effort: Pulmonary effort is normal.     Breath sounds: Normal breath sounds.  Musculoskeletal:        General: Normal range of motion.     Cervical back: Normal range of motion.     Right lower leg: No edema.     Left lower leg: No edema.  Skin:    General: Skin is warm and dry.  Neurological:     General: No focal deficit present.     Mental Status: He is alert and oriented to person, place, and time. Mental status is at baseline.  Psychiatric:        Mood and Affect: Mood normal.        Behavior: Behavior normal.    Filed Weights   01/23/20 0500 01/24/20 0650 01/25/20 0600  Weight: 45.8 kg 75.8 kg 75.7 kg    No intake or output data in the 24 hours ending 01/25/20 1243 Net IO Since Admission: -8,778 mL [01/25/20 1243]  Pertinent Labs: CBC  Latest Ref Rng & Units 01/17/2020 01/07/2020 01/06/2020  WBC 4.0 - 10.5 K/uL 4.9 4.1 4.3  Hemoglobin 13.0 - 17.0 g/dL 14.4 12.7(L) 12.7(L)  Hematocrit 39.0 - 52.0 % 46.2 40.9 40.2  Platelets 150 - 400 K/uL 192 176 181    CMP Latest Ref Rng & Units 01/25/2020 01/25/2020 01/18/2020  Glucose 70 - 99 mg/dL 75 - -  BUN 8 - 23 mg/dL 21 - -  Creatinine 0.61 - 1.24 mg/dL 1.14 1.19 1.19  Sodium 135 - 145 mmol/L 137 - -  Potassium 3.5 - 5.1 mmol/L 3.7 - -  Chloride 98 - 111 mmol/L 99 - -  CO2 22 - 32 mmol/L 25 - -  Calcium 8.9 - 10.3 mg/dL 9.7 - -  Total Protein 6.5 - 8.1 g/dL - - -  Total Bilirubin 0.3 - 1.2 mg/dL - - -  Alkaline Phos 38 - 126 U/L - - -  AST 15 - 41 U/L - - -  ALT 0 - 44 U/L - - -    Imaging: No results found.  Assessment/Plan:   Principal Problem:   HFrEF (heart  failure with reduced ejection fraction) (HCC) Active Problems:   HLD (hyperlipidemia)   Essential hypertension   Diabetes type 2, controlled (HCC)   Bilateral lower extremity edema   Shortness of breath   Tobacco use disorder   Obesity   Orthostatic hypotension   Pleural effusion due to congestive heart failure (HCC)   Aortic atherosclerosis (HCC)   Coronary artery disease   Hepatic cyst  Patient Summary: [name] is a 64 y.o. with a pertinent PMH of hypertension, type 2 diabetes on insulin, major depressive disorder, bipolar disorder, PTSD, renal cell carcinoma status post nephrectomy, chronic knee pain, who presented with bilateral lower extremity swelling and shortness of breath and admitted for new onset HFrEF complicated by large right transudative pleural effusion status post thoracentesis.   Acute heart failure with reduced EF(30-35%) (newly diagnosed) Transudative right pleural effusion s/p thoracentesis (11/20). CAD Aortic atherosclosis Chronic hypertension and hyperlipidemia Weight of 75 kg today.  Appears to be euvolemic on exam.  He is currently waiting SNF through the  [place] -Continue Lipitor 40 mg -Continue furosemide 20 mg twice daily -Continue metoprolol succinate 12.5 mg daily - PT/OT: Pending placement at SNF  SOB Patient noting a couple episodes of SOB yesterday lasting 2-3. On exam does not appear fluid overloaded. Chest is clear to ascultation. May be component of anxiety causing these episodes. Will have patient notify PT or nurse during these episodes. - Continue aripiprazole, duloxetine, and lamotrigine.  UncontrolledType 2 diabeteswith Hyperglycemia Restarted on metformin yesterday. Fasting glucose 75 and mealtime glucose 120-200. -Continue Lantus to 40 units nightly, NovoLog 5 units 3 times daily with meals -SSI  Liver cyst -Follow up outpatient  Diet: Heart Healthy IVF: None,None VTE: Enoxaparin Code: Full PT/OT recs: SNF for Subacute PT, walker, beside commode, shower seat TOC recs: SNF pending placement through [place]  Dispo: Anticipated discharge to Skilled nursing facility in 1 days pending placement.   [name] 01/25/20 12:43 PM Pager [phone]  Please contact the on call pager after 5 pm and on weekends at [phone].

## 2020-01-25 NOTE — Progress Notes (Signed)
EMS arrived to transport patient.  Report given to transport, advised unable to contact facility for report.

## 2020-01-25 NOTE — Progress Notes (Signed)
Attempted to call report to [facility] x2, no answer.

## 2020-01-25 NOTE — Progress Notes (Signed)
Physical Therapy Treatment Patient Details Name: [name] MRN: [id number] DOB: [birth year] Today's Date: 01/25/2020    History of Present Illness 64yo male c/o DOE and BLE edema, CXR with volume overload and R pleural effusion. Significant systolic impairment found on ECHO. Negative for PE. PMH anxiety, CA, DM, h/o suicide attempt, HTN, HLD, osteoporosis    PT Comments    Pt was able to demonstrate good use of cane (safe) still remains unsteady with staggering gait pattern requiring min to min guard assist for all mobility, especially stairs.  Gait speed and 5xs sit to stand data indicate high fall risk.  Next session to focus on balance poses and attempt DGI.   Follow Up Recommendations  SNF;Supervision/Assistance - 24 hour     Equipment Recommendations  Rolling walker with 5" wheels    Recommendations for Other Services       Precautions / Restrictions Precautions Precautions: Fall Precaution Comments: fell out of chair 11/30 (call bell didn't work per pt)    Mobility  Bed Mobility               General bed mobility comments: Pt seated EOB  Transfers Overall transfer level: Needs assistance Equipment used: Straight cane Transfers: Sit to/from Stand Sit to Stand: Min guard         General transfer comment: min guard assist to steady  Ambulation/Gait Ambulation/Gait assistance: Min guard Gait Distance (Feet): 200 Feet Assistive device: Straight cane Gait Pattern/deviations: Step-through pattern;Staggering left;Staggering right Gait velocity: 0.84 ft/sec Gait velocity interpretation: <1.31 ft/sec, indicative of household ambulator General Gait Details: Pt with mildly staggering gait pattern, min guard assist for safety and balance, good technique with cane   Stairs Stairs: Yes Stairs assistance: Min assist Stair Management: One rail Right;Step to pattern;Forwards;With cane Number of Stairs: 8 General stair comments: Cues for rail on one side cane  on other, min assist for steadying, especially coming down the stairs (decreased eccentric control).   Wheelchair Mobility    Modified Rankin (Stroke Patients Only)       Balance Overall balance assessment: Needs assistance Sitting-balance support: Feet supported;No upper extremity supported Sitting balance-[name]: Normal     Standing balance support: Single extremity supported Standing balance-[name]: Fair                              Cognition Arousal/Alertness: Awake/alert Behavior During Therapy: WFL for tasks assessed/performed Overall Cognitive Status: Within Functional Limits for tasks assessed                                 General Comments: conversation normal, not specifically tested      Exercises      General Comments General comments (skin integrity, edema, etc.): 5xs sit to stand : 32.09 seconds (over twice "normal" for his age)--indicates high fall risk.      Pertinent Vitals/Pain Pain Assessment: No/denies pain Pain Score: 0-No pain    Home Living                      Prior Function            PT Goals (current goals can now be found in the care plan section) Acute Rehab PT Goals Patient Stated Goal: agreeable to rehab-reports nervous about going home alone b/c of stairs and fall Progress towards PT goals: Progressing toward goals  Frequency    Min 3X/week      PT Plan Current plan remains appropriate    Co-evaluation              AM-PAC PT "6 Clicks" Mobility   Outcome Measure  Help needed turning from your back to your side while in a flat bed without using bedrails?: None Help needed moving from lying on your back to sitting on the side of a flat bed without using bedrails?: None Help needed moving to and from a bed to a chair (including a wheelchair)?: A Little Help needed standing up from a chair using your arms (e.g., wheelchair or bedside chair)?: A Little Help needed to  walk in hospital room?: A Little Help needed climbing 3-5 steps with a railing? : A Little 6 Click Score: 20    End of Session Equipment Utilized During Treatment: Gait belt Activity Tolerance: Patient tolerated treatment well Patient left: in chair;with call bell/phone within reach;with chair alarm set   PT Visit Diagnosis: Unsteadiness on feet (R26.81);Muscle weakness (generalized) (M62.81);Difficulty in walking, not elsewhere classified (R26.2)     Time: 1962-2297 PT Time Calculation (min) (ACUTE ONLY): 27 min  Charges:  $Gait Training: 23-37 mins                     Verdene Lennert, PT, DPT  Acute Rehabilitation [phone] pager [phone]) [phone] office

## 2020-01-28 ENCOUNTER — Telehealth: Payer: Self-pay

## 2020-01-28 NOTE — Telephone Encounter (Signed)
Transition Care Management Unsuccessful Follow-up Telephone Call  Date of discharge and from where:  01/25/2020 [name]  Attempts:  1st Attempt  Reason for unsuccessful TCM follow-up call:  Left voice message

## 2020-01-28 NOTE — Progress Notes (Deleted)
Cardiology Office Note   Date:  01/28/2020   ID:  [name], DOB 1955-07-13, MRN [id number]  PCP:  Patient, No Pcp Per  Cardiologist:  Dr. Johnsie Cancel    No chief complaint on file.     History of Present Illness: [name] is a 64 y.o. male who presents for post hospital from 01/04/20 to 01/25/20   PMH of DM, HTN, HLD PTSD/Bipolar renal cell post nephrectomy Sees [place] mostly. Was seen in the hospital for DOE and LE edema.  Large Rt pl effusion, neg CTA for PE.  EF 25-30%.   moderate MR/TR. A1C of 9.9.  Cr 1.3 at baseline.  HCTZ stopped,   Placed on lasix IV , plan ischemic work up as outpt.  D/c'd on Metop. 12.5 daily and lasix 20  Needs BMET  Wt down ***75.7 Kg on discharge.  Was 96.2kg on admit  Underwent thoracentesis 01/05/20 with 750 ml removed last CXR 01/13/20 mild rt base atelectais no adenopathy.  MRi of head was done for dizziness and unremarkable.   Today ***   Past Medical History:  Diagnosis Date  . Anxiety   . Arthritis   . Cancer ([place])   . Cataract   . Depression   . Diabetes mellitus   . GERD (gastroesophageal reflux disease)   . H/O suicide attempt 1997 and 2011   jumped out of moving vehicle and attempted to jump into moving traffic  . High cholesterol   . Hypertension   . Neuropathic pain   . Osteoporosis     Past Surgical History:  Procedure Laterality Date  . COLONOSCOPY    . NEPHRECTOMY     due to cancer     Current Outpatient Medications  Medication Sig Dispense Refill  . ARIPiprazole (ABILIFY) 10 MG tablet Take 10 mg by mouth daily. Takes one-half tablet in the morning    . ARIPiprazole (ABILIFY) 10 MG tablet Take 1 tablet (10 mg total) by mouth daily. 30 tablet 0  . atorvastatin (LIPITOR) 40 MG tablet Take 1 tablet (40 mg total) by mouth daily. (Patient taking differently: Take 80 mg by mouth daily. )    . DULoxetine (CYMBALTA) 20 MG capsule Take 20 mg by mouth daily.    . DULoxetine (CYMBALTA) 20 MG capsule Take  1 capsule (20 mg total) by mouth daily. 30 capsule 3  . furosemide (LASIX) 20 MG tablet Take 1 tablet (20 mg total) by mouth 2 (two) times daily. 30 tablet 0  . gabapentin (NEURONTIN) 600 MG tablet Take 600 mg by mouth daily as needed (right knee pain).    . insulin glargine (LANTUS) 100 UNIT/ML injection Inject 0.4 mLs (40 Units total) into the skin at bedtime. 10 mL 11  . lamoTRIgine (LAMICTAL) 200 MG tablet Take 200 mg by mouth 2 (two) times daily. One-half tablet twice daily    . metFORMIN (GLUCOPHAGE) 1000 MG tablet TAKE ONE TABLET BY MOUTH TWICE DAILY WITH MEAL (Patient not taking: Reported on 01/04/2020) 30 tablet 5  . metoprolol succinate (TOPROL-XL) 25 MG 24 hr tablet Take 0.5 tablets (12.5 mg total) by mouth daily. 30 tablet 0  . Multiple Vitamin (MULTIVITAMIN WITH MINERALS) TABS tablet Take 1 tablet by mouth daily.    . nicotine polacrilex (COMMIT) 2 MG lozenge Take 2 mg by mouth every 2 (two) hours as needed for smoking cessation.     No current facility-administered medications for this visit.    Allergies:   Morphine and related  Social History:  The patient  reports that he has been smoking cigarettes. He has a 10.00 pack-year smoking history. He has never used smokeless tobacco. He reports that he does not drink alcohol and does not use drugs.   Family History:  The patient's ***family history includes Alcohol abuse in his brother and sister.    ROS:  General:no colds or fevers, no weight changes Skin:no rashes or ulcers HEENT:no blurred vision, no congestion CV:see HPI PUL:see HPI GI:no diarrhea constipation or melena, no indigestion GU:no hematuria, no dysuria MS:no joint pain, no claudication Neuro:no syncope, no lightheadedness Endo:no diabetes, no thyroid disease Wt Readings from Last 3 Encounters:  01/25/20 166 lb 14.2 oz (75.7 kg)  12/30/16 212 lb (96.2 kg)  12/16/16 212 lb 12.8 oz (96.5 kg)     PHYSICAL EXAM: VS:  There were no vitals taken for this  visit. , BMI There is no height or weight on file to calculate BMI. General:Pleasant affect, NAD Skin:Warm and dry, brisk capillary refill HEENT:normocephalic, sclera clear, mucus membranes moist Neck:supple, no JVD, no bruits  Heart:S1S2 RRR without murmur, gallup, rub or click Lungs:clear without rales, rhonchi, or wheezes GTX:MIWO, non tender, + BS, do not palpate liver spleen or masses Ext:no lower ext edema, 2+ pedal pulses, 2+ radial pulses Neuro:alert and oriented, MAE, follows commands, + facial symmetry    EKG:  EKG is ordered today. The ekg ordered today demonstrates ***   Recent Labs: 01/04/2020: B Natriuretic Peptide 560.0 01/06/2020: ALT 22 01/17/2020: Hemoglobin 14.4; Platelets 192 01/25/2020: BUN 21; Creatinine, Ser 1.19; Creatinine, Ser 1.14; Magnesium 1.4; Potassium 3.7; Sodium 137    Lipid Panel    Component Value Date/Time   CHOL  05/04/2008 0535    127        ATP III CLASSIFICATION:  <200     mg/dL   Desirable  200-239  mg/dL   Borderline High  >=240    mg/dL   High          TRIG 90 05/04/2008 0535   HDL 27 (L) 05/04/2008 0535   CHOLHDL 4.7 05/04/2008 0535   VLDL 18 05/04/2008 0535   LDLCALC  05/04/2008 0535    82        Total Cholesterol/HDL:CHD Risk Coronary Heart Disease Risk Table                     Men   Women  1/2 Average Risk   3.4   3.3  Average Risk       5.0   4.4  2 X Average Risk   9.6   7.1  3 X Average Risk  23.4   11.0        Use the calculated Patient Ratio above and the CHD Risk Table to determine the patient's CHD Risk.        ATP III CLASSIFICATION (LDL):  <100     mg/dL   Optimal  100-129  mg/dL   Near or Above                    Optimal  130-159  mg/dL   Borderline  160-189  mg/dL   High  >190     mg/dL   Very High       Other studies Reviewed: Additional studies/ records that were reviewed today include: ***.   ASSESSMENT AND PLAN:  1.  ***   Current medicines are reviewed with the patient today.  The  patient  Has no concerns regarding medicines.  The following changes have been made:  See above Labs/ tests ordered today include:see above  Disposition:   FU:  see above  Signed, Cecilie Kicks, NP  01/28/2020 9:16 AM    [place], [place], [place], [place] Phone: [phone]; Fax: [phone]

## 2020-01-28 NOTE — Telephone Encounter (Signed)
Transition Care Management Follow-up Telephone Call  Date of discharge and from where:01/25/2020 [name]  How have you been since you were released from the hospital? Doing alright, needs a cardiology appointment at the [facility] center in [place].  Any questions or concerns? No  Items Reviewed:  Did the pt receive and understand the discharge instructions provided? Yes   Medications obtained and verified? Yes   Other? No   Any new allergies since your discharge? No   Dietary orders reviewed? Yes  Do you have support at home? Yes    Functional Questionnaire: (I = Independent and D = Dependent) ADLs: I  Bathing/Dressing- I  Meal Prep- I  Eating- I  Maintaining continence- I  Transferring/Ambulation- I  Managing Meds- I  Follow up appointments reviewed:   PCP Hospital f/u appt confirmed? No  He did confirm his PCP is at [facility]. Called to schedule but they are needing Discharge summary faxed then patient will be contact for an appt.    [facility] f/u appt confirmed? Yes  Scheduled to see Cecilie Kicks, NP on 01/29/2020 @ 10:15am.  Are transportation arrangements needed? No   If their condition worsens, is the pt aware to call PCP or go to the Emergency Dept.? Yes  Was the patient provided with contact information for the PCP's office or ED? Yes  Was to pt encouraged to call back with questions or concerns? Yes

## 2020-01-29 ENCOUNTER — Ambulatory Visit: Payer: Medicaid Other | Admitting: Cardiology

## 2020-06-11 ENCOUNTER — Telehealth: Payer: Self-pay | Admitting: *Deleted

## 2020-06-11 NOTE — Telephone Encounter (Signed)
Spoke with patient regarding the New Patient consult requested by the [facility] in [facility] Wednesday 07/16/20 at 11:40 am with Dr. Gardiner Rhyme.  Will mail information to patient and he voiced his understanding.

## 2020-06-11 NOTE — Telephone Encounter (Signed)
Left message for patient to call and schedule cardiology consult for cardiomyopathy  per [facility] referral received 06/11/20

## 2020-06-11 NOTE — Telephone Encounter (Signed)
PT is returning a phone he received.Please advise

## 2020-06-16 ENCOUNTER — Telehealth: Payer: Self-pay

## 2020-06-16 NOTE — Telephone Encounter (Signed)
Notes faxed to [facility] 

## 2020-07-16 ENCOUNTER — Ambulatory Visit: Payer: No Typology Code available for payment source | Admitting: Cardiology

## 2020-08-29 ENCOUNTER — Encounter (HOSPITAL_COMMUNITY): Payer: Self-pay | Admitting: Emergency Medicine

## 2020-08-29 ENCOUNTER — Other Ambulatory Visit: Payer: Self-pay

## 2020-08-29 ENCOUNTER — Emergency Department (HOSPITAL_COMMUNITY)
Admission: EM | Admit: 2020-08-29 | Discharge: 2020-08-30 | Disposition: A | Discharge status: Home / Self Care | Payer: No Typology Code available for payment source | Attending: Emergency Medicine | Admitting: Emergency Medicine

## 2020-08-29 DIAGNOSIS — I2510 Atherosclerotic heart disease of native coronary artery without angina pectoris: Secondary | ICD-10-CM | POA: Insufficient documentation

## 2020-08-29 DIAGNOSIS — Z794 Long term (current) use of insulin: Secondary | ICD-10-CM | POA: Insufficient documentation

## 2020-08-29 DIAGNOSIS — I10 Essential (primary) hypertension: Secondary | ICD-10-CM | POA: Diagnosis not present

## 2020-08-29 DIAGNOSIS — R000 Tachycardia, unspecified: Secondary | ICD-10-CM

## 2020-08-29 DIAGNOSIS — M545 Low back pain, unspecified: Secondary | ICD-10-CM | POA: Insufficient documentation

## 2020-08-29 DIAGNOSIS — M1990 Unspecified osteoarthritis, unspecified site: Secondary | ICD-10-CM | POA: Diagnosis not present

## 2020-08-29 DIAGNOSIS — M79604 Pain in right leg: Secondary | ICD-10-CM | POA: Diagnosis present

## 2020-08-29 DIAGNOSIS — Z85528 Personal history of other malignant neoplasm of kidney: Secondary | ICD-10-CM | POA: Insufficient documentation

## 2020-08-29 DIAGNOSIS — Z7984 Long term (current) use of oral hypoglycemic drugs: Secondary | ICD-10-CM | POA: Insufficient documentation

## 2020-08-29 DIAGNOSIS — F17210 Nicotine dependence, cigarettes, uncomplicated: Secondary | ICD-10-CM | POA: Insufficient documentation

## 2020-08-29 DIAGNOSIS — E119 Type 2 diabetes mellitus without complications: Secondary | ICD-10-CM | POA: Diagnosis not present

## 2020-08-29 DIAGNOSIS — Z20822 Contact with and (suspected) exposure to covid-19: Secondary | ICD-10-CM | POA: Insufficient documentation

## 2020-08-29 DIAGNOSIS — Z79899 Other long term (current) drug therapy: Secondary | ICD-10-CM | POA: Insufficient documentation

## 2020-08-29 NOTE — ED Triage Notes (Signed)
Patient reports chronic pain at lower back , bilateral legs and posterior neck worse this week , denies recent fall or injury.

## 2020-08-30 LAB — RAPID URINE DRUG SCREEN, HOSP PERFORMED
Amphetamines: NOT DETECTED
Barbiturates: NOT DETECTED
Benzodiazepines: NOT DETECTED
Cocaine: NOT DETECTED
Opiates: NOT DETECTED
Tetrahydrocannabinol: NOT DETECTED

## 2020-08-30 LAB — CBC WITH DIFFERENTIAL/PLATELET
Abs Immature Granulocytes: 0.01 10*3/uL (ref 0.00–0.07)
Basophils Absolute: 0 10*3/uL (ref 0.0–0.1)
Basophils Relative: 1 %
Eosinophils Absolute: 0.1 10*3/uL (ref 0.0–0.5)
Eosinophils Relative: 2 %
HCT: 41.3 % (ref 39.0–52.0)
Hemoglobin: 13.4 g/dL (ref 13.0–17.0)
Immature Granulocytes: 0 %
Lymphocytes Relative: 29 %
Lymphs Abs: 1.6 10*3/uL (ref 0.7–4.0)
MCH: 27.8 pg (ref 26.0–34.0)
MCHC: 32.4 g/dL (ref 30.0–36.0)
MCV: 85.7 fL (ref 80.0–100.0)
Monocytes Absolute: 0.6 10*3/uL (ref 0.1–1.0)
Monocytes Relative: 10 %
Neutro Abs: 3.1 10*3/uL (ref 1.7–7.7)
Neutrophils Relative %: 58 %
Platelets: 197 10*3/uL (ref 150–400)
RBC: 4.82 MIL/uL (ref 4.22–5.81)
RDW: 14.8 % (ref 11.5–15.5)
WBC: 5.4 10*3/uL (ref 4.0–10.5)
nRBC: 0 % (ref 0.0–0.2)

## 2020-08-30 LAB — BASIC METABOLIC PANEL
Anion gap: 9 (ref 5–15)
BUN: 15 mg/dL (ref 8–23)
CO2: 25 mmol/L (ref 22–32)
Calcium: 9.2 mg/dL (ref 8.9–10.3)
Chloride: 104 mmol/L (ref 98–111)
Creatinine, Ser: 1.09 mg/dL (ref 0.61–1.24)
GFR, Estimated: >60 mL/min (ref >60)
Glucose, Bld: 245 mg/dL — ABNORMAL HIGH (ref 70–99)
Potassium: 3.8 mmol/L (ref 3.5–5.1)
Sodium: 138 mmol/L (ref 135–145)

## 2020-08-30 LAB — URINALYSIS, ROUTINE W REFLEX MICROSCOPIC
Bacteria, UA: NONE SEEN
Bilirubin Urine: NEGATIVE
Glucose, UA: >=500 mg/dL — AB
Hgb urine dipstick: NEGATIVE
Ketones, ur: NEGATIVE mg/dL
Leukocytes,Ua: NEGATIVE
Nitrite: NEGATIVE
Protein, ur: NEGATIVE mg/dL
Specific Gravity, Urine: 1.024 (ref 1.005–1.030)
pH: 5 (ref 5.0–8.0)

## 2020-08-30 LAB — LACTIC ACID, PLASMA: Lactic Acid, Venous: 1 mmol/L (ref 0.5–1.9)

## 2020-08-30 LAB — RESP PANEL BY RT-PCR (FLU A&B, COVID) ARPGX2
Influenza A by PCR: NEGATIVE
Influenza B by PCR: NEGATIVE
SARS Coronavirus 2 by RT PCR: NEGATIVE

## 2020-08-30 MED ORDER — MELOXICAM 7.5 MG PO TABS: 7.5000 mg | ORAL_TABLET | Freq: Every day | ORAL | 0 refills | Status: DC

## 2020-08-30 MED ORDER — SODIUM CHLORIDE 0.9 % IV BOLUS
500.0000 mL | Freq: Once | INTRAVENOUS | Status: AC
  Administered 2020-08-30: 500 mL via INTRAVENOUS

## 2020-08-30 MED ORDER — KETOROLAC TROMETHAMINE 30 MG/ML IJ SOLN
15.0000 mg | Freq: Once | INTRAMUSCULAR | Status: AC
  Administered 2020-08-30: 15 mg via INTRAVENOUS
  Filled 2020-08-30: qty 1

## 2020-08-30 NOTE — Discharge Instructions (Addendum)
Schedule follow-up with your doctor to be rechecked for your joint pain.  If you develop fever, redness of the joint, chest pain, shortness of breath return to the ER immediately.

## 2020-08-30 NOTE — ED Provider Notes (Signed)
[facility] EMERGENCY DEPARTMENT Provider Note   CSN: [id number] Arrival date & time: 08/29/20  2208     History Chief Complaint  Patient presents with   Back/Legs pain     [name] is a 65 y.o. male.  Patient presents to the emergency department for evaluation of back and leg pain.  Patient reports that has been going on for "a minute".  Patient reports that he has a "joint disease".  Patient reports generalized aches all over.  He reports that his back and legs are currently painful with movement.  He denies any injury.      Past Medical History:  Diagnosis Date   Anxiety    Arthritis    Cancer ([place])    Cataract    Depression    Diabetes mellitus    GERD (gastroesophageal reflux disease)    H/O suicide attempt 1997 and 2011   jumped out of moving vehicle and attempted to jump into moving traffic   High cholesterol    Hypertension    Neuropathic pain    Osteoporosis     Patient Active Problem List   Diagnosis Date Noted   Pleural effusion due to congestive heart failure ([place]) 01/11/2020   Aortic atherosclerosis ([place]) 01/11/2020   Coronary artery disease 01/11/2020   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   Orthostatic hypotension 01/10/2020   HFrEF (heart failure with reduced ejection fraction) ([place]) 01/06/2020   Tobacco use disorder 01/06/2020   Obesity 01/06/2020   Bilateral lower extremity edema 01/04/2020   Shortness of breath 01/04/2020   MDD (major depressive disorder), recurrent severe, without psychosis ([place]) 10/09/2016   Diabetes type 2, controlled ([place]) 01/01/2015   Erectile dysfunction 01/01/2015   Healthcare maintenance 01/01/2015   Bipolar 1 disorder, depressed ([place]) 07/23/2013   Bipolar disorder ([place]) 04/03/2013   MDD (major depressive disorder), recurrent, severe, with psychosis ([place]) 11/04/2012   Suicidal ideation 11/04/2012   Post traumatic stress disorder (PTSD) 11/04/2012   CARCINOMA, RENAL CELL 06/05/2008   HLD  (hyperlipidemia) 06/05/2008   Essential hypertension 06/05/2008   CHEST PAIN 06/05/2008    Past Surgical History:  Procedure Laterality Date   COLONOSCOPY     NEPHRECTOMY     due to cancer       Family History  Problem Relation Age of Onset   Alcohol abuse Sister    Alcohol abuse Brother    Colon cancer Neg Hx    Esophageal cancer Neg Hx    Pancreatic cancer Neg Hx    Rectal cancer Neg Hx    Stomach cancer Neg Hx     Social History   Tobacco Use   Smoking status: Every Day    Packs/day: 0.50    Years: 20.00    Pack years: 10.00    Types: Cigarettes   Smokeless tobacco: Never  Vaping Use   Vaping Use: Never used  Substance Use Topics   Alcohol use: No    Alcohol/week: 3.0 standard drinks    Types: 3 Cans of beer per week    Comment: Pt has been clean for 8 months   Drug use: No    Home Medications Prior to Admission medications   Medication Sig Start Date End Date Taking? Authorizing Provider  meloxicam (MOBIC) 7.5 MG tablet Take 1 tablet (7.5 mg total) by mouth daily. 08/30/20  Yes Arnitra Sokoloski, Gwenyth Allegra, MD  ARIPiprazole (ABILIFY) 10 MG tablet Take 10 mg by mouth daily. Takes one-half tablet in the morning    [provider]  ARIPiprazole (ABILIFY) 10 MG tablet Take 1 tablet (10 mg total) by mouth daily. 01/25/20   [name], MD  atorvastatin (LIPITOR) 40 MG tablet Take 1 tablet (40 mg total) by mouth daily. Patient taking differently: Take 80 mg by mouth daily.  07/27/13   Niel Hummer, NP  DULoxetine (CYMBALTA) 20 MG capsule Take 20 mg by mouth daily.    [provider]  DULoxetine (CYMBALTA) 20 MG capsule Take 1 capsule (20 mg total) by mouth daily. 01/25/20   [name], MD  furosemide (LASIX) 20 MG tablet Take 1 tablet (20 mg total) by mouth 2 (two) times daily. 01/25/20   [name], MD  gabapentin (NEURONTIN) 600 MG tablet Take 600 mg by mouth daily as needed (right knee pain).    [provider]  insulin glargine  (LANTUS) 100 UNIT/ML injection Inject 0.4 mLs (40 Units total) into the skin at bedtime. 01/25/20   [name], MD  lamoTRIgine (LAMICTAL) 200 MG tablet Take 200 mg by mouth 2 (two) times daily. One-half tablet twice daily    [provider]  metFORMIN (GLUCOPHAGE) 1000 MG tablet TAKE ONE TABLET BY MOUTH TWICE DAILY WITH MEAL Patient not taking: Reported on 01/04/2020 03/21/15   Veatrice Bourbon, MD  metoprolol succinate (TOPROL-XL) 25 MG 24 hr tablet Take 0.5 tablets (12.5 mg total) by mouth daily. 01/26/20   [name], MD  Multiple Vitamin (MULTIVITAMIN WITH MINERALS) TABS tablet Take 1 tablet by mouth daily.    [provider]  nicotine polacrilex (COMMIT) 2 MG lozenge Take 2 mg by mouth every 2 (two) hours as needed for smoking cessation.    [provider]    Allergies    Morphine and related  Review of Systems   Review of Systems  Musculoskeletal:  Positive for arthralgias and back pain.  All other systems reviewed and are negative.  Physical Exam Updated Vital Signs BP (!) 130/93   Pulse (!) 104   Temp 98 F (36.7 C) (Oral)   Resp 18   Ht 5\' 10"  (1.778 m)   Wt 105 kg   SpO2 94%   BMI 33.21 kg/m   Physical Exam Vitals and nursing note reviewed.  Constitutional:      General: He is not in acute distress.    Appearance: Normal appearance. He is well-developed.  HENT:     Head: Normocephalic and atraumatic.     Right Ear: Hearing normal.     Left Ear: Hearing normal.     Nose: Nose normal.  Eyes:     Conjunctiva/sclera: Conjunctivae normal.     Pupils: Pupils are equal, round, and reactive to light.  Cardiovascular:     Rate and Rhythm: Regular rhythm.     Pulses:          Dorsalis pedis pulses are 1+ on the right side and 1+ on the left side.     Heart sounds: S1 normal and S2 normal. No murmur heard.   No friction rub. No gallop.  Pulmonary:     Effort: Pulmonary effort is normal. No respiratory distress.     Breath sounds:  Normal breath sounds.  Chest:     Chest wall: No tenderness.  Abdominal:     General: Bowel sounds are normal.     Palpations: Abdomen is soft.     Tenderness: There is no abdominal tenderness. There is no guarding or rebound. Negative signs include Murphy's sign and McBurney's sign.  Hernia: No hernia is present.  Musculoskeletal:     Cervical back: Normal range of motion and neck supple.     Lumbar back: Tenderness present. Negative right straight leg raise test and negative left straight leg raise test.     Right hip: No deformity or tenderness. Decreased range of motion.     Left hip: No deformity or tenderness. Decreased range of motion.     Right knee: Bony tenderness present. No swelling, deformity, effusion or erythema. Decreased range of motion.     Left knee: Bony tenderness present. No swelling, deformity, effusion or erythema. Decreased range of motion.     Right foot: No swelling or deformity. Normal pulse.     Left foot: No swelling or deformity. Normal pulse.  Skin:    General: Skin is warm and dry.     Findings: No rash.  Neurological:     Mental Status: He is alert and oriented to person, place, and time.     GCS: GCS eye subscore is 4. GCS verbal subscore is 5. GCS motor subscore is 6.     Cranial Nerves: No cranial nerve deficit.     Sensory: No sensory deficit.     Coordination: Coordination normal.     Deep Tendon Reflexes:     Reflex Scores:      Patellar reflexes are 1+ on the right side and 1+ on the left side. Psychiatric:        Speech: Speech normal.        Behavior: Behavior normal.        Thought Content: Thought content normal.    ED Results / Procedures / Treatments   Labs (all labs ordered are listed, but only abnormal results are displayed) Labs Reviewed  BASIC METABOLIC PANEL - Abnormal; Notable for the following components:      Result Value   Glucose, Bld 245 (*)    All other components within normal limits  RESP PANEL BY RT-PCR (FLU  A&B, COVID) ARPGX2  CBC WITH DIFFERENTIAL/PLATELET  LACTIC ACID, PLASMA  URINALYSIS, ROUTINE W REFLEX MICROSCOPIC  RAPID URINE DRUG SCREEN, HOSP PERFORMED    EKG EKG Interpretation  Date/Time:  Saturday August 30 2020 00:52:36 EDT Ventricular Rate:  96 PR Interval:  188 QRS Duration: 97 QT Interval:  360 QTC Calculation: 455 R Axis:   83 Text Interpretation: Sinus rhythm Biatrial enlargement Borderline right axis deviation Left ventricular hypertrophy nsc Confirmed by Orpah Greek [phone]) on 08/30/2020 1:19:54 AM  Radiology No results found.  Procedures Procedures   Medications Ordered in ED Medications  sodium chloride 0.9 % bolus 500 mL (0 mLs Intravenous Stopped 08/30/20 0233)  ketorolac (TORADOL) 30 MG/ML injection 15 mg (15 mg Intravenous Given 08/30/20 0422)  sodium chloride 0.9 % bolus 500 mL (500 mLs Intravenous New Bag/Given 08/30/20 0422)    ED Course  I have reviewed the triage vital signs and the nursing notes.  Pertinent labs & imaging results that were available during my care of the patient were reviewed by me and considered in my medical decision making (see chart for details).    MDM Rules/Calculators/A&P                          Patient presents to the emergency department for evaluation of generalized aches and pains.  Patient reports a history of arthritis and has chronic pain.  He denies any recent injury.  Patient complaining of pain in the  lower back and both of his legs.  He has some pain with range of motion of the lumbar spine as well as hips, knees and feet.  Examination of the joints, however, does not reveal any erythema, warmth, swelling or concern for infection.  Patient has normal overlying skin, no sign of cellulitis or skin or soft tissue infection.  Patient has palpable pulses and normal reflexes, normal strength and sensation.  No foot drop, saddle anesthesia or signs of cauda equina syndrome.  Patient was noted to be slightly  tachycardic at arrival.  Etiology is unclear.  Patient is afebrile.  Lab work was checked.  He has a normal white blood cell count, negative lactic acid.  He is breathing comfortably.  No shortness of breath.  No hypoxia.  No tachypnea.  Doubt PE.  Patient was administered low-dose IV Toradol and his pain has resolved.  He wants to be discharged.  I discussed with him that his heart rate is somewhat elevated and I was still looking for reasons for that, however he states that he feels fine and would like to be discharged.  As he appears well at this time, it is reasonable to discharge and he was given return precautions.  Final Clinical Impression(s) / ED Diagnoses Final diagnoses:  Arthritis  Sinus tachycardia    Rx / DC Orders ED Discharge Orders          Ordered    meloxicam (MOBIC) 7.5 MG tablet  Daily        08/30/20 0458             Orpah Greek, MD 08/30/20 [phone]

## 2020-08-30 NOTE — ED Notes (Signed)
Pt refused PIV and fluids and only wants ED techs to help him while in ED

## 2020-09-04 ENCOUNTER — Ambulatory Visit: Payer: No Typology Code available for payment source | Admitting: Cardiology

## 2020-10-07 ENCOUNTER — Encounter: Payer: Self-pay | Admitting: Cardiology

## 2020-10-07 ENCOUNTER — Ambulatory Visit (INDEPENDENT_AMBULATORY_CARE_PROVIDER_SITE_OTHER): Payer: No Typology Code available for payment source | Admitting: Cardiology

## 2020-10-07 ENCOUNTER — Other Ambulatory Visit: Payer: Self-pay

## 2020-10-07 VITALS — BP 140/82 | HR 105 | Ht 70.0 in | Wt 195.6 lb

## 2020-10-07 DIAGNOSIS — Z01818 Encounter for other preprocedural examination: Secondary | ICD-10-CM | POA: Diagnosis not present

## 2020-10-07 DIAGNOSIS — E785 Hyperlipidemia, unspecified: Secondary | ICD-10-CM | POA: Diagnosis not present

## 2020-10-07 DIAGNOSIS — I10 Essential (primary) hypertension: Secondary | ICD-10-CM

## 2020-10-07 DIAGNOSIS — I5042 Chronic combined systolic (congestive) and diastolic (congestive) heart failure: Secondary | ICD-10-CM

## 2020-10-07 DIAGNOSIS — Z01812 Encounter for preprocedural laboratory examination: Secondary | ICD-10-CM

## 2020-10-07 LAB — BASIC METABOLIC PANEL
BUN/Creatinine Ratio: 12 (ref 10–24)
BUN: 11 mg/dL (ref 8–27)
CO2: 24 mmol/L (ref 20–29)
Calcium: 9.7 mg/dL (ref 8.6–10.2)
Chloride: 102 mmol/L (ref 96–106)
Creatinine, Ser: 0.95 mg/dL (ref 0.76–1.27)
Glucose: 193 mg/dL — ABNORMAL HIGH (ref 65–99)
Potassium: 4.1 mmol/L (ref 3.5–5.2)
Sodium: 139 mmol/L (ref 134–144)
eGFR: 90 mL/min/{1.73_m2} (ref >59)

## 2020-10-07 LAB — CBC
Hematocrit: 44.3 % (ref 37.5–51.0)
Hemoglobin: 14.5 g/dL (ref 13.0–17.7)
MCH: 28 pg (ref 26.6–33.0)
MCHC: 32.7 g/dL (ref 31.5–35.7)
MCV: 86 fL (ref 79–97)
Platelets: 194 10*3/uL (ref 150–450)
RBC: 5.18 x10E6/uL (ref 4.14–5.80)
RDW: 14.6 % (ref 11.6–15.4)
WBC: 5 10*3/uL (ref 3.4–10.8)

## 2020-10-07 NOTE — Progress Notes (Signed)
Cardiology Office Note:    Date:  10/08/2020   ID:  [name], DOB 09/03/1955, MRN [id number]  PCP:  [facility]  Cardiologist:  Kevin Rouge, MD  Electrophysiologist:  None   Referring MD: Kevin Rossetti, MD   Chief Complaint  Patient presents with   Congestive Heart Failure    History of Present Illness:    [name] is a 65 y.o. male with a hx of chronic combined systolic and diastolic heart failure, [id number], hypertension, BPD, polysubstance abuse but none since 2013, renal cell carcinoma status post nephrectomy who is referred by Dr [name] for evaluation of heart failure.  He was admitted November 2021 with dyspnea and lower extremity edema, found to have new diagnosis heart failure.  Underwent thoracentesis with removal of 700 cc.  He was started on GDMT with Entresto and Coreg.  Developed orthostatic hypotension.  Underwent neuro work-up which was negative for stroke.  [name] was held in [place] was changed to metoprolol with improvement.  He was diuresed almost 8 L, felt to be at dry rate 183 pounds on discharge.  Ischemic evaluation was recommended as outpatient.  He has been following with cardiology in the [place].  Currently on Jardiance 12.5 mg daily, Lasix 20 mg daily as needed, Toprol-XL 25 mg daily twice daily, and Entresto 24-26 mg twice daily.  Reports he has been doing well.  Denies any chest pain, dyspnea, lightheadedness, syncope, or palpitations.  Reports occasional lower extremity edema.    Echocardiogram 01/05/2020 showed EF 25 to 30%, normal RV function, moderate mitral vegetation, moderate tricuspid regurgitation.    Wt Readings from Last 3 Encounters:  10/07/20 195 lb 9.6 oz (88.7 kg)  08/29/20 231 lb 7.7 oz (105 kg)  01/25/20 166 lb 14.2 oz (75.7 kg)      Past Medical History:  Diagnosis Date   Anxiety    Arthritis    Cancer ([place])    Cataract    Depression    Diabetes mellitus    GERD (gastroesophageal reflux disease)     H/O suicide attempt 1997 and 2011   jumped out of moving vehicle and attempted to jump into moving traffic   High cholesterol    Hypertension    Neuropathic pain    Osteoporosis     Past Surgical History:  Procedure Laterality Date   COLONOSCOPY     NEPHRECTOMY     due to cancer    Current Medications: Current Meds  Medication Sig   ARIPiprazole (ABILIFY) 10 MG tablet Take 5 mg by mouth daily.   atorvastatin (LIPITOR) 40 MG tablet Take 1 tablet (40 mg total) by mouth daily.   DULoxetine (CYMBALTA) 20 MG capsule Take 20 mg by mouth daily.   empagliflozin (JARDIANCE) 25 MG TABS tablet Take 12.5 mg by mouth daily.   furosemide (LASIX) 20 MG tablet Take 20 mg by mouth daily as needed.   gabapentin (NEURONTIN) 600 MG tablet Take 600 mg by mouth daily as needed (right knee pain).   insulin glargine (LANTUS) 100 UNIT/ML injection Inject 0.4 mLs (40 Units total) into the skin at bedtime.   lamoTRIgine (LAMICTAL) 200 MG tablet Take 200 mg by mouth 2 (two) times daily. One-half tablet twice daily   meloxicam (MOBIC) 7.5 MG tablet Take 1 tablet (7.5 mg total) by mouth daily.   metFORMIN (GLUCOPHAGE-XR) 500 MG 24 hr tablet Take 500 mg by mouth daily with breakfast. 1 in the AM 2 in the PM   metoprolol succinate (  TOPROL-XL) 25 MG 24 hr tablet Take 0.5 tablets (12.5 mg total) by mouth daily.   Multiple Vitamin (MULTIVITAMIN WITH MINERALS) TABS tablet Take 1 tablet by mouth daily.   nicotine polacrilex (COMMIT) 2 MG lozenge Take 2 mg by mouth every 2 (two) hours as needed for smoking cessation.   omeprazole (PRILOSEC) 20 MG capsule Take 20 mg by mouth daily.   rosuvastatin (CRESTOR) 40 MG tablet Take 40 mg by mouth daily.   sacubitril-valsartan (ENTRESTO) 49-51 MG Take 0.5 tablets by mouth 2 (two) times daily.   [DISCONTINUED] ARIPiprazole (ABILIFY) 10 MG tablet Take 1 tablet (10 mg total) by mouth daily.   [DISCONTINUED] DULoxetine (CYMBALTA) 20 MG capsule Take 1 capsule (20 mg total) by  mouth daily.   [DISCONTINUED] furosemide (LASIX) 20 MG tablet Take 1 tablet (20 mg total) by mouth 2 (two) times daily.   [DISCONTINUED] metFORMIN (GLUCOPHAGE) 1000 MG tablet TAKE ONE TABLET BY MOUTH TWICE DAILY WITH MEAL   [DISCONTINUED] metoprolol succinate (TOPROL-XL) 25 MG 24 hr tablet Take 25 mg by mouth 2 (two) times daily.     Allergies:   Morphine and related   Social History   Socioeconomic History   Marital status: Single    Spouse name: Not on file   Number of children: Not on file   Years of education: Not on file   Highest education level: Not on file  Occupational History   Not on file  Tobacco Use   Smoking status: Every Day    Packs/day: 0.50    Years: 20.00    Pack years: 10.00    Types: Cigarettes   Smokeless tobacco: Never  Vaping Use   Vaping Use: Never used  Substance and Sexual Activity   Alcohol use: No    Alcohol/week: 3.0 standard drinks    Types: 3 Cans of beer per week    Comment: Pt has been clean for 8 months   Drug use: No   Sexual activity: Yes    Birth control/protection: Condom  Other Topics Concern   Not on file  Social History Narrative   Not on file   Social Determinants of Health   Financial Resource Strain: Not on file  Food Insecurity: Not on file  Transportation Needs: Not on file  Physical Activity: Not on file  Stress: Not on file  Social Connections: Not on file     Family History: The patient's family history includes Alcohol abuse in his brother and sister. There is no history of Colon cancer, Esophageal cancer, Pancreatic cancer, Rectal cancer, or Stomach cancer.  ROS:   Please see the history of present illness.     All other systems reviewed and are negative.  EKGs/Labs/Other Studies Reviewed:    The following studies were reviewed today:   EKG:  EKG is  ordered today.  The ekg ordered today demonstrates sinus tachycardia, rate 105, LVH  Recent Labs: 01/04/2020: B Natriuretic Peptide 560.0 01/06/2020:  ALT 22 01/25/2020: Magnesium 1.4 10/07/2020: BUN 11; Creatinine, Ser 0.95; Hemoglobin 14.5; Platelets 194; Potassium 4.1; Sodium 139  Recent Lipid Panel    Component Value Date/Time   CHOL  05/04/2008 0535    127        ATP III CLASSIFICATION:  <200     mg/dL   Desirable  200-239  mg/dL   Borderline High  >=240    mg/dL   High          TRIG 90 05/04/2008 0535   HDL 27 (L)  05/04/2008 0535   CHOLHDL 4.7 05/04/2008 0535   VLDL 18 05/04/2008 0535   LDLCALC  05/04/2008 0535    82        Total Cholesterol/HDL:CHD Risk Coronary Heart Disease Risk Table                     Men   Women  1/2 Average Risk   3.4   3.3  Average Risk       5.0   4.4  2 X Average Risk   9.6   7.1  3 X Average Risk  23.4   11.0        Use the calculated Patient Ratio above and the CHD Risk Table to determine the patient's CHD Risk.        ATP III CLASSIFICATION (LDL):  <100     mg/dL   Optimal  100-129  mg/dL   Near or Above                    Optimal  130-159  mg/dL   Borderline  160-189  mg/dL   High  >190     mg/dL   Very High    Physical Exam:    VS:  BP 140/82   Pulse (!) 105   Ht '5\' 10"'$  (1.778 m)   Wt 195 lb 9.6 oz (88.7 kg)   SpO2 97%   BMI 28.07 kg/m     Wt Readings from Last 3 Encounters:  10/07/20 195 lb 9.6 oz (88.7 kg)  08/29/20 231 lb 7.7 oz (105 kg)  01/25/20 166 lb 14.2 oz (75.7 kg)     GEN:  Well nourished, well developed in no acute distress HEENT: Normal NECK: No JVD; No carotid bruits LYMPHATICS: No lymphadenopathy CARDIAC: RRR, no murmurs, rubs, gallops RESPIRATORY:  Clear to auscultation without rales, wheezing or rhonchi  ABDOMEN: Soft, non-tender, non-distended MUSCULOSKELETAL:  No edema; No deformity  SKIN: Warm and dry NEUROLOGIC:  Alert and oriented x 3 PSYCHIATRIC:  Normal affect   ASSESSMENT:    1. Chronic combined systolic and diastolic heart failure ([place])   2. Pre-procedure lab exam   3. Essential hypertension   4. Hyperlipidemia, unspecified  hyperlipidemia type    PLAN:    Chronic combined systolic and diastolic heart failure: Echocardiogram 01/05/2020 showed EF 25 to 30%, normal RV function, moderate mitral vegetation, moderate tricuspid regurgitation.  Has been following with cardiology at [facility].  He was referred here for cath. -Plan RHC/LHC to work-up cardiomyopathy.  Risks and benefits of cardiac catheterization have been discussed with the patient.  These include bleeding, infection, kidney damage, stroke, heart attack, death.  The patient understands these risks and is willing to proceed. -He has a history of nephrectomy but renal function has been normal.  Will check BMP, if elevated creatinine will need hydration prior to cath -Continue Jardiance 12.5 mg daily, Lasix 20 mg daily as needed, Toprol-XL 25 mg daily twice daily, and Entresto 24-26 mg twice daily.  He has appointment with [facility] pharmacist later this month for further medication titration and follow-up with his cardiologist at the [facility] next month.  Reports he plans to continue to follow with the [facility] for cardiology, was sent here for cath.  Hyperlipidemia: LDL 168.  Started on rosuvastatin 40 mg daily at the [place]  Hypertension: Continue Entresto, Toprol-XL   RTC as needed.  Plans to follow with cardiology at the [facility]  Medication Adjustments/Labs and Tests Ordered: Current  medicines are reviewed at length with the patient today.  Concerns regarding medicines are outlined above.  Orders Placed This Encounter  Procedures   Basic metabolic panel   CBC   EKG 12-Lead   No orders of the defined types were placed in this encounter.   Patient Instructions   [place] [place] [address] Dept: [phone] Loc: [place]  10/07/2020  You are scheduled for a Cardiac Catheterization on Thursday, August 25 with Dr. Harrell Gave End.  1.  Please arrive at the [facility] (Main Entrance A) at [facility]: [place], [place] [address] at 8:30 AM (This time is two hours before your procedure to ensure your preparation). Free valet parking service is available.   Special note: Every effort is made to have your procedure done on time. Please understand that emergencies sometimes delay scheduled procedures.  2. Diet: Do not eat solid foods after midnight.  The patient may have clear liquids until 5am upon the day of the procedure.  3. Labs: today in office  4. Medication instructions in preparation for your procedure:   Contrast Allergy: No  Take 1/2 of your normal dose of insulin the night before procedure NO insulin the morning of procedure  NO Jardiance AM of procedure    Hold furosemide (Lasix) day of procedure  Do not take Diabetes Med Glucophage (Metformin) on the day of the procedure and HOLD 48 HOURS AFTER THE PROCEDURE.  On the morning of your procedure, take your Aspirin and any morning medicines NOT listed above.  You may use sips of water.  5. Plan for one night stay--bring personal belongings. 6. Bring a current list of your medications and current insurance cards. 7. You MUST have a responsible person to drive you home. 8. Someone MUST be with you the first 24 hours after you arrive home or your discharge will be delayed. 9. Please wear clothes that are easy to get on and off and wear slip-on shoes.  Thank you for allowing [place] to care for you!   -- [place] [facility]   Follow up with Dr. Gardiner Rhyme as needed. [facility]   Signed, Donato Heinz, MD  10/08/2020 8:42 AM    [place]

## 2020-10-07 NOTE — Patient Instructions (Addendum)
  [address] [place] [address] Dept: [phone] Loc: [place]  10/07/2020  You are scheduled for a Cardiac Catheterization on Thursday, August 25 with Dr. Harrell Gave End.  1. Please arrive at the [facility] (Main Entrance A) at [facility]: [place], [place] [address] at 8:30 AM (This time is two hours before your procedure to ensure your preparation). Free valet parking service is available.   Special note: Every effort is made to have your procedure done on time. Please understand that emergencies sometimes delay scheduled procedures.  2. Diet: Do not eat solid foods after midnight.  The patient may have clear liquids until 5am upon the day of the procedure.  3. Labs: today in office  4. Medication instructions in preparation for your procedure:   Contrast Allergy: No  Take 1/2 of your normal dose of insulin the night before procedure NO insulin the morning of procedure  NO Jardiance AM of procedure    Hold furosemide (Lasix) day of procedure  Do not take Diabetes Med Glucophage (Metformin) on the day of the procedure and HOLD 48 HOURS AFTER THE PROCEDURE.  On the morning of your procedure, take your Aspirin and any morning medicines NOT listed above.  You may use sips of water.  5. Plan for one night stay--bring personal belongings. 6. Bring a current list of your medications and current insurance cards. 7. You MUST have a responsible person to drive you home. 8. Someone MUST be with you the first 24 hours after you arrive home or your discharge will be delayed. 9. Please wear clothes that are easy to get on and off and wear slip-on shoes.  Thank you for allowing [place] to care for you!   -- [place] [facility]   Follow up with Dr. Gardiner Rhyme as needed. [facility]

## 2020-10-07 NOTE — H&P (View-Only) (Signed)
Cardiology Office Note:    Date:  10/08/2020   ID:  [name], DOB 1956-02-11, MRN [id number]  PCP:  [facility]  Cardiologist:  Jenkins Rouge, MD  Electrophysiologist:  None   Referring MD: Dianna Rossetti, MD   Chief Complaint  Patient presents with   Congestive Heart Failure    History of Present Illness:    [name] is a 65 y.o. male with a hx of chronic combined systolic and diastolic heart failure, [id number], hypertension, BPD, polysubstance abuse but none since 2013, renal cell carcinoma status post nephrectomy who is referred by Dr Raynelle Dick for evaluation of heart failure.  He was admitted November 2021 with dyspnea and lower extremity edema, found to have new diagnosis heart failure.  Underwent thoracentesis with removal of 700 cc.  He was started on GDMT with Entresto and Coreg.  Developed orthostatic hypotension.  Underwent neuro work-up which was negative for stroke.  [name] was held in [place] was changed to metoprolol with improvement.  He was diuresed almost 8 L, felt to be at dry rate 183 pounds on discharge.  Ischemic evaluation was recommended as outpatient.  He has been following with cardiology in the [place].  Currently on Jardiance 12.5 mg daily, Lasix 20 mg daily as needed, Toprol-XL 25 mg daily twice daily, and Entresto 24-26 mg twice daily.  Reports he has been doing well.  Denies any chest pain, dyspnea, lightheadedness, syncope, or palpitations.  Reports occasional lower extremity edema.    Echocardiogram 01/05/2020 showed EF 25 to 30%, normal RV function, moderate mitral vegetation, moderate tricuspid regurgitation.    Wt Readings from Last 3 Encounters:  10/07/20 195 lb 9.6 oz (88.7 kg)  08/29/20 231 lb 7.7 oz (105 kg)  01/25/20 166 lb 14.2 oz (75.7 kg)      Past Medical History:  Diagnosis Date   Anxiety    Arthritis    Cancer ([place])    Cataract    Depression    Diabetes mellitus    GERD (gastroesophageal reflux disease)     H/O suicide attempt 1997 and 2011   jumped out of moving vehicle and attempted to jump into moving traffic   High cholesterol    Hypertension    Neuropathic pain    Osteoporosis     Past Surgical History:  Procedure Laterality Date   COLONOSCOPY     NEPHRECTOMY     due to cancer    Current Medications: Current Meds  Medication Sig   ARIPiprazole (ABILIFY) 10 MG tablet Take 5 mg by mouth daily.   atorvastatin (LIPITOR) 40 MG tablet Take 1 tablet (40 mg total) by mouth daily.   DULoxetine (CYMBALTA) 20 MG capsule Take 20 mg by mouth daily.   empagliflozin (JARDIANCE) 25 MG TABS tablet Take 12.5 mg by mouth daily.   furosemide (LASIX) 20 MG tablet Take 20 mg by mouth daily as needed.   gabapentin (NEURONTIN) 600 MG tablet Take 600 mg by mouth daily as needed (right knee pain).   insulin glargine (LANTUS) 100 UNIT/ML injection Inject 0.4 mLs (40 Units total) into the skin at bedtime.   lamoTRIgine (LAMICTAL) 200 MG tablet Take 200 mg by mouth 2 (two) times daily. One-half tablet twice daily   meloxicam (MOBIC) 7.5 MG tablet Take 1 tablet (7.5 mg total) by mouth daily.   metFORMIN (GLUCOPHAGE-XR) 500 MG 24 hr tablet Take 500 mg by mouth daily with breakfast. 1 in the AM 2 in the PM   metoprolol succinate (  TOPROL-XL) 25 MG 24 hr tablet Take 0.5 tablets (12.5 mg total) by mouth daily.   Multiple Vitamin (MULTIVITAMIN WITH MINERALS) TABS tablet Take 1 tablet by mouth daily.   nicotine polacrilex (COMMIT) 2 MG lozenge Take 2 mg by mouth every 2 (two) hours as needed for smoking cessation.   omeprazole (PRILOSEC) 20 MG capsule Take 20 mg by mouth daily.   rosuvastatin (CRESTOR) 40 MG tablet Take 40 mg by mouth daily.   sacubitril-valsartan (ENTRESTO) 49-51 MG Take 0.5 tablets by mouth 2 (two) times daily.   [DISCONTINUED] ARIPiprazole (ABILIFY) 10 MG tablet Take 1 tablet (10 mg total) by mouth daily.   [DISCONTINUED] DULoxetine (CYMBALTA) 20 MG capsule Take 1 capsule (20 mg total) by  mouth daily.   [DISCONTINUED] furosemide (LASIX) 20 MG tablet Take 1 tablet (20 mg total) by mouth 2 (two) times daily.   [DISCONTINUED] metFORMIN (GLUCOPHAGE) 1000 MG tablet TAKE ONE TABLET BY MOUTH TWICE DAILY WITH MEAL   [DISCONTINUED] metoprolol succinate (TOPROL-XL) 25 MG 24 hr tablet Take 25 mg by mouth 2 (two) times daily.     Allergies:   Morphine and related   Social History   Socioeconomic History   Marital status: Single    Spouse name: Not on file   Number of children: Not on file   Years of education: Not on file   Highest education level: Not on file  Occupational History   Not on file  Tobacco Use   Smoking status: Every Day    Packs/day: 0.50    Years: 20.00    Pack years: 10.00    Types: Cigarettes   Smokeless tobacco: Never  Vaping Use   Vaping Use: Never used  Substance and Sexual Activity   Alcohol use: No    Alcohol/week: 3.0 standard drinks    Types: 3 Cans of beer per week    Comment: Pt has been clean for 8 months   Drug use: No   Sexual activity: Yes    Birth control/protection: Condom  Other Topics Concern   Not on file  Social History Narrative   Not on file   Social Determinants of Health   Financial Resource Strain: Not on file  Food Insecurity: Not on file  Transportation Needs: Not on file  Physical Activity: Not on file  Stress: Not on file  Social Connections: Not on file     Family History: The patient's family history includes Alcohol abuse in his brother and sister. There is no history of Colon cancer, Esophageal cancer, Pancreatic cancer, Rectal cancer, or Stomach cancer.  ROS:   Please see the history of present illness.     All other systems reviewed and are negative.  EKGs/Labs/Other Studies Reviewed:    The following studies were reviewed today:   EKG:  EKG is  ordered today.  The ekg ordered today demonstrates sinus tachycardia, rate 105, LVH  Recent Labs: 01/04/2020: B Natriuretic Peptide 560.0 01/06/2020:  ALT 22 01/25/2020: Magnesium 1.4 10/07/2020: BUN 11; Creatinine, Ser 0.95; Hemoglobin 14.5; Platelets 194; Potassium 4.1; Sodium 139  Recent Lipid Panel    Component Value Date/Time   CHOL  05/04/2008 0535    127        ATP III CLASSIFICATION:  <200     mg/dL   Desirable  200-239  mg/dL   Borderline High  >=240    mg/dL   High          TRIG 90 05/04/2008 0535   HDL 27 (L)  05/04/2008 0535   CHOLHDL 4.7 05/04/2008 0535   VLDL 18 05/04/2008 0535   LDLCALC  05/04/2008 0535    82        Total Cholesterol/HDL:CHD Risk Coronary Heart Disease Risk Table                     Men   Women  1/2 Average Risk   3.4   3.3  Average Risk       5.0   4.4  2 X Average Risk   9.6   7.1  3 X Average Risk  23.4   11.0        Use the calculated Patient Ratio above and the CHD Risk Table to determine the patient's CHD Risk.        ATP III CLASSIFICATION (LDL):  <100     mg/dL   Optimal  100-129  mg/dL   Near or Above                    Optimal  130-159  mg/dL   Borderline  160-189  mg/dL   High  >190     mg/dL   Very High    Physical Exam:    VS:  BP 140/82   Pulse (!) 105   Ht '5\' 10"'$  (1.778 m)   Wt 195 lb 9.6 oz (88.7 kg)   SpO2 97%   BMI 28.07 kg/m     Wt Readings from Last 3 Encounters:  10/07/20 195 lb 9.6 oz (88.7 kg)  08/29/20 231 lb 7.7 oz (105 kg)  01/25/20 166 lb 14.2 oz (75.7 kg)     GEN:  Well nourished, well developed in no acute distress HEENT: Normal NECK: No JVD; No carotid bruits LYMPHATICS: No lymphadenopathy CARDIAC: RRR, no murmurs, rubs, gallops RESPIRATORY:  Clear to auscultation without rales, wheezing or rhonchi  ABDOMEN: Soft, non-tender, non-distended MUSCULOSKELETAL:  No edema; No deformity  SKIN: Warm and dry NEUROLOGIC:  Alert and oriented x 3 PSYCHIATRIC:  Normal affect   ASSESSMENT:    1. Chronic combined systolic and diastolic heart failure ([place])   2. Pre-procedure lab exam   3. Essential hypertension   4. Hyperlipidemia, unspecified  hyperlipidemia type    PLAN:    Chronic combined systolic and diastolic heart failure: Echocardiogram 01/05/2020 showed EF 25 to 30%, normal RV function, moderate mitral vegetation, moderate tricuspid regurgitation.  Has been following with cardiology at [facility].  He was referred here for cath. -Plan RHC/LHC to work-up cardiomyopathy.  Risks and benefits of cardiac catheterization have been discussed with the patient.  These include bleeding, infection, kidney damage, stroke, heart attack, death.  The patient understands these risks and is willing to proceed. -He has a history of nephrectomy but renal function has been normal.  Will check BMP, if elevated creatinine will need hydration prior to cath -Continue Jardiance 12.5 mg daily, Lasix 20 mg daily as needed, Toprol-XL 25 mg daily twice daily, and Entresto 24-26 mg twice daily.  He has appointment with [facility] pharmacist later this month for further medication titration and follow-up with his cardiologist at the [facility] next month.  Reports he plans to continue to follow with the [facility] for cardiology, was sent here for cath.  Hyperlipidemia: LDL 168.  Started on rosuvastatin 40 mg daily at the [place]  Hypertension: Continue Entresto, Toprol-XL   RTC as needed.  Plans to follow with cardiology at the [facility]  Medication Adjustments/Labs and Tests Ordered: Current  medicines are reviewed at length with the patient today.  Concerns regarding medicines are outlined above.  Orders Placed This Encounter  Procedures   Basic metabolic panel   CBC   EKG 12-Lead   No orders of the defined types were placed in this encounter.   Patient Instructions   [address] [address] Dept: [phone] Loc: [place]  10/07/2020  You are scheduled for a Cardiac Catheterization on Thursday, August 25 with Dr. Harrell Gave End.  1.  Please arrive at the [facility] (Main Entrance A) at [facility]: [place], [place] [address] at 8:30 AM (This time is two hours before your procedure to ensure your preparation). Free valet parking service is available.   Special note: Every effort is made to have your procedure done on time. Please understand that emergencies sometimes delay scheduled procedures.  2. Diet: Do not eat solid foods after midnight.  The patient may have clear liquids until 5am upon the day of the procedure.  3. Labs: today in office  4. Medication instructions in preparation for your procedure:   Contrast Allergy: No  Take 1/2 of your normal dose of insulin the night before procedure NO insulin the morning of procedure  NO Jardiance AM of procedure    Hold furosemide (Lasix) day of procedure  Do not take Diabetes Med Glucophage (Metformin) on the day of the procedure and HOLD 48 HOURS AFTER THE PROCEDURE.  On the morning of your procedure, take your Aspirin and any morning medicines NOT listed above.  You may use sips of water.  5. Plan for one night stay--bring personal belongings. 6. Bring a current list of your medications and current insurance cards. 7. You MUST have a responsible person to drive you home. 8. Someone MUST be with you the first 24 hours after you arrive home or your discharge will be delayed. 9. Please wear clothes that are easy to get on and off and wear slip-on shoes.  Thank you for allowing [place] to care for you!   -- [facility]   Follow up with Dr. Gardiner Rhyme as needed. [facility]   Signed, Donato Heinz, MD  10/08/2020 8:42 AM    [place]

## 2020-10-08 ENCOUNTER — Telehealth: Payer: Self-pay | Admitting: *Deleted

## 2020-10-08 NOTE — Telephone Encounter (Addendum)
Cardiac catheterization scheduled at [facility] for: Thursday October 09, 2020 10:30 AM [facility] Main Entrance A [facility]) at: 8:30 AM   No solid food after midnight prior to cath, clear liquids until 5 AM day of procedure.  Medication instructions: Hold: -Jardiance-AM of procedure -Metformin-day of procedure and 48 hours post procedure -Insulin-pt reports he takes prn -Lasix-pt reports he takes prn  Except hold medications morning medications can be taken pre-cath with sips of water including aspirin 81 mg.    Confirmed patient has responsible adult to drive home post procedure and be with patient first 24 hours after arriving home. *  Patients are allowed one visitor in the waiting room during the time they are at the hospital for their procedure. Both patient and visitor must wear a mask once they enter the hospital.   Patient reports does not currently have any symptoms concerning for COVID-19 and no household members with COVID-19 like illness.      Reviewed procedure/mask/visitor instructions with patient.                           *Patient reports his plan was to ride the bus to [facility] tomorrow for cardiac cath.                          Patient reports  has no one to drive him home or responsible adult to be with him the first 24 hours home if he is a same day discharge tomorrow after the cath.                           I have registered patient with [facility] transportation [place]).                            They will provide transportation to the hospital for cardiac cath tomorrow morning. They will also provide transportation when patient is discharged.                             Per [employer] [facility] transportation, patient will be asked to sign waiver when transportation is provided at discharge for patient.                             Patient is aware that he will be asked to sign waiver.                                   Patient reports that he was told at office  visit yesterday that he would be kept overnight tomorrow night since he would not have responsible adult to be with him if he is same day discharge.

## 2020-10-09 ENCOUNTER — Observation Stay (HOSPITAL_COMMUNITY)
Admission: RE | Admit: 2020-10-09 | Discharge: 2020-10-11 | Disposition: A | Discharge status: Home / Self Care | Payer: No Typology Code available for payment source | Attending: Internal Medicine | Admitting: Internal Medicine

## 2020-10-09 ENCOUNTER — Encounter (HOSPITAL_COMMUNITY): Payer: Self-pay | Admitting: Internal Medicine

## 2020-10-09 ENCOUNTER — Observation Stay (HOSPITAL_BASED_OUTPATIENT_CLINIC_OR_DEPARTMENT_OTHER): Payer: No Typology Code available for payment source

## 2020-10-09 ENCOUNTER — Other Ambulatory Visit: Payer: Self-pay

## 2020-10-09 ENCOUNTER — Ambulatory Visit (HOSPITAL_COMMUNITY)
Admission: RE | Disposition: A | Discharge status: Home / Self Care | Payer: Self-pay | Source: Home / Self Care | Attending: Internal Medicine

## 2020-10-09 DIAGNOSIS — Z79899 Other long term (current) drug therapy: Secondary | ICD-10-CM | POA: Insufficient documentation

## 2020-10-09 DIAGNOSIS — I5022 Chronic systolic (congestive) heart failure: Secondary | ICD-10-CM | POA: Diagnosis not present

## 2020-10-09 DIAGNOSIS — I25110 Atherosclerotic heart disease of native coronary artery with unstable angina pectoris: Principal | ICD-10-CM | POA: Insufficient documentation

## 2020-10-09 DIAGNOSIS — I10 Essential (primary) hypertension: Secondary | ICD-10-CM | POA: Diagnosis present

## 2020-10-09 DIAGNOSIS — I5020 Unspecified systolic (congestive) heart failure: Secondary | ICD-10-CM | POA: Diagnosis present

## 2020-10-09 DIAGNOSIS — I110 Hypertensive heart disease with heart failure: Secondary | ICD-10-CM | POA: Insufficient documentation

## 2020-10-09 DIAGNOSIS — R079 Chest pain, unspecified: Secondary | ICD-10-CM | POA: Diagnosis present

## 2020-10-09 DIAGNOSIS — Z794 Long term (current) use of insulin: Secondary | ICD-10-CM | POA: Diagnosis not present

## 2020-10-09 DIAGNOSIS — Z7984 Long term (current) use of oral hypoglycemic drugs: Secondary | ICD-10-CM | POA: Insufficient documentation

## 2020-10-09 DIAGNOSIS — I2510 Atherosclerotic heart disease of native coronary artery without angina pectoris: Secondary | ICD-10-CM | POA: Diagnosis not present

## 2020-10-09 DIAGNOSIS — Z85528 Personal history of other malignant neoplasm of kidney: Secondary | ICD-10-CM | POA: Insufficient documentation

## 2020-10-09 DIAGNOSIS — Z20822 Contact with and (suspected) exposure to covid-19: Secondary | ICD-10-CM | POA: Insufficient documentation

## 2020-10-09 DIAGNOSIS — E119 Type 2 diabetes mellitus without complications: Secondary | ICD-10-CM | POA: Diagnosis not present

## 2020-10-09 DIAGNOSIS — I509 Heart failure, unspecified: Secondary | ICD-10-CM | POA: Diagnosis present

## 2020-10-09 DIAGNOSIS — I5042 Chronic combined systolic (congestive) and diastolic (congestive) heart failure: Secondary | ICD-10-CM | POA: Insufficient documentation

## 2020-10-09 DIAGNOSIS — F17210 Nicotine dependence, cigarettes, uncomplicated: Secondary | ICD-10-CM | POA: Diagnosis not present

## 2020-10-09 DIAGNOSIS — Z955 Presence of coronary angioplasty implant and graft: Secondary | ICD-10-CM

## 2020-10-09 DIAGNOSIS — E1159 Type 2 diabetes mellitus with other circulatory complications: Secondary | ICD-10-CM

## 2020-10-09 DIAGNOSIS — E785 Hyperlipidemia, unspecified: Secondary | ICD-10-CM | POA: Diagnosis present

## 2020-10-09 HISTORY — PX: RIGHT/LEFT HEART CATH AND CORONARY ANGIOGRAPHY: CATH118266

## 2020-10-09 LAB — ECHOCARDIOGRAM COMPLETE
AR max vel: 1.78 cm2
AV Area VTI: 1.71 cm2
AV Area mean vel: 1.69 cm2
AV Mean grad: 3 mmHg
AV Peak grad: 5.7 mmHg
Ao pk vel: 1.19 m/s
Area-P 1/2: 4.8 cm2
Calc EF: 44.3 %
Height: 70 in
S' Lateral: 4.5 cm
Single Plane A2C EF: 48.1 %
Single Plane A4C EF: 35.3 %
Weight: 3120 oz

## 2020-10-09 LAB — POCT I-STAT EG7
Acid-Base Excess: 0 mmol/L (ref 0.0–2.0)
Bicarbonate: 25.4 mmol/L (ref 20.0–28.0)
Calcium, Ion: 1.26 mmol/L (ref 1.15–1.40)
HCT: 41 % (ref 39.0–52.0)
Hemoglobin: 13.9 g/dL (ref 13.0–17.0)
O2 Saturation: 70 %
Potassium: 3.8 mmol/L (ref 3.5–5.1)
Sodium: 143 mmol/L (ref 135–145)
TCO2: 27 mmol/L (ref 22–32)
pCO2, Ven: 42.8 mmHg — ABNORMAL LOW (ref 44.0–60.0)
pH, Ven: 7.381 (ref 7.250–7.430)
pO2, Ven: 37 mmHg (ref 32.0–45.0)

## 2020-10-09 LAB — GLUCOSE, CAPILLARY
Glucose-Capillary: 153 mg/dL — ABNORMAL HIGH (ref 70–99)
Glucose-Capillary: 127 mg/dL — ABNORMAL HIGH (ref 70–99)
Glucose-Capillary: 96 mg/dL (ref 70–99)
Glucose-Capillary: 179 mg/dL — ABNORMAL HIGH (ref 70–99)

## 2020-10-09 LAB — SARS CORONAVIRUS 2 (TAT 6-24 HRS): SARS Coronavirus 2: NEGATIVE

## 2020-10-09 SURGERY — RIGHT/LEFT HEART CATH AND CORONARY ANGIOGRAPHY
Anesthesia: LOCAL

## 2020-10-09 MED ORDER — VERAPAMIL HCL 2.5 MG/ML IV SOLN
INTRAVENOUS | Status: DC | PRN
  Administered 2020-10-09: 10 mL via INTRA_ARTERIAL

## 2020-10-09 MED ORDER — INSULIN GLARGINE-YFGN 100 UNIT/ML ~~LOC~~ SOLN
25.0000 [IU] | Freq: Every day | SUBCUTANEOUS | Status: DC
  Administered 2020-10-09 – 2020-10-10 (×2): 25 [IU] via SUBCUTANEOUS
  Filled 2020-10-09 (×3): qty 0.25

## 2020-10-09 MED ORDER — DIPHENHYDRAMINE HCL 50 MG/ML IJ SOLN
INTRAMUSCULAR | Status: AC
  Filled 2020-10-09: qty 1

## 2020-10-09 MED ORDER — ADULT MULTIVITAMIN W/MINERALS CH
1.0000 | ORAL_TABLET | Freq: Every day | ORAL | Status: DC
  Administered 2020-10-10 – 2020-10-11 (×2): 1 via ORAL
  Filled 2020-10-09 (×3): qty 1

## 2020-10-09 MED ORDER — ASPIRIN EC 81 MG PO TBEC
81.0000 mg | DELAYED_RELEASE_TABLET | Freq: Every day | ORAL | Status: DC
  Administered 2020-10-10: 81 mg via ORAL
  Filled 2020-10-09 (×2): qty 1

## 2020-10-09 MED ORDER — IOHEXOL 350 MG/ML SOLN
INTRAVENOUS | Status: DC | PRN
  Administered 2020-10-09: 5 mL

## 2020-10-09 MED ORDER — ACETAMINOPHEN 325 MG PO TABS: 650.0000 mg | ORAL_TABLET | ORAL | Status: DC | PRN

## 2020-10-09 MED ORDER — LAMOTRIGINE 100 MG PO TABS
100.0000 mg | ORAL_TABLET | Freq: Two times a day (BID) | ORAL | Status: DC
  Administered 2020-10-09 – 2020-10-11 (×5): 100 mg via ORAL
  Filled 2020-10-09 (×5): qty 1

## 2020-10-09 MED ORDER — HEPARIN SODIUM (PORCINE) 1000 UNIT/ML IJ SOLN
INTRAMUSCULAR | Status: AC
  Filled 2020-10-09: qty 1

## 2020-10-09 MED ORDER — VERAPAMIL HCL 2.5 MG/ML IV SOLN
INTRAVENOUS | Status: AC
  Filled 2020-10-09: qty 2

## 2020-10-09 MED ORDER — ASPIRIN 81 MG PO CHEW
81.0000 mg | CHEWABLE_TABLET | ORAL | Status: AC
  Administered 2020-10-10: 81 mg via ORAL
  Filled 2020-10-09: qty 1

## 2020-10-09 MED ORDER — POLYETHYLENE GLYCOL 3350 17 G PO PACK
17.0000 g | PACK | Freq: Every day | ORAL | Status: DC | PRN
  Administered 2020-10-09: 17 g via ORAL
  Filled 2020-10-09: qty 1

## 2020-10-09 MED ORDER — INSULIN ASPART 100 UNIT/ML IJ SOLN
0.0000 [IU] | Freq: Three times a day (TID) | INTRAMUSCULAR | Status: DC
  Administered 2020-10-10 – 2020-10-11 (×2): 2 [IU] via SUBCUTANEOUS

## 2020-10-09 MED ORDER — HEPARIN (PORCINE) IN NACL 1000-0.9 UT/500ML-% IV SOLN
INTRAVENOUS | Status: DC | PRN
  Administered 2020-10-09 (×2): 500 mL

## 2020-10-09 MED ORDER — SODIUM CHLORIDE 0.9% FLUSH: 3.0000 mL | INTRAVENOUS | Status: DC | PRN

## 2020-10-09 MED ORDER — SODIUM CHLORIDE 0.9% FLUSH: 3.0000 mL | Freq: Two times a day (BID) | INTRAVENOUS | Status: DC

## 2020-10-09 MED ORDER — ROSUVASTATIN CALCIUM 20 MG PO TABS
40.0000 mg | ORAL_TABLET | Freq: Every day | ORAL | Status: DC
  Administered 2020-10-09 – 2020-10-11 (×3): 40 mg via ORAL
  Filled 2020-10-09 (×3): qty 2

## 2020-10-09 MED ORDER — SODIUM CHLORIDE 0.9% FLUSH
3.0000 mL | Freq: Two times a day (BID) | INTRAVENOUS | Status: DC
  Administered 2020-10-09: 3 mL via INTRAVENOUS

## 2020-10-09 MED ORDER — ARIPIPRAZOLE 5 MG PO TABS
5.0000 mg | ORAL_TABLET | Freq: Every day | ORAL | Status: DC
  Administered 2020-10-09 – 2020-10-11 (×3): 5 mg via ORAL
  Filled 2020-10-09 (×3): qty 1

## 2020-10-09 MED ORDER — LIDOCAINE HCL (PF) 1 % IJ SOLN
INTRAMUSCULAR | Status: AC
  Filled 2020-10-09: qty 30

## 2020-10-09 MED ORDER — EMPAGLIFLOZIN 10 MG PO TABS
10.0000 mg | ORAL_TABLET | Freq: Every day | ORAL | Status: DC
  Administered 2020-10-11: 10 mg via ORAL
  Filled 2020-10-09 (×2): qty 1

## 2020-10-09 MED ORDER — LABETALOL HCL 5 MG/ML IV SOLN: 10.0000 mg | INTRAVENOUS | Status: AC | PRN

## 2020-10-09 MED ORDER — SODIUM CHLORIDE 0.9% FLUSH
3.0000 mL | Freq: Two times a day (BID) | INTRAVENOUS | Status: DC
  Administered 2020-10-09 – 2020-10-11 (×3): 3 mL via INTRAVENOUS

## 2020-10-09 MED ORDER — DULOXETINE HCL 20 MG PO CPEP
20.0000 mg | ORAL_CAPSULE | Freq: Every day | ORAL | Status: DC
  Administered 2020-10-09 – 2020-10-11 (×3): 20 mg via ORAL
  Filled 2020-10-09 (×3): qty 1

## 2020-10-09 MED ORDER — HEPARIN SODIUM (PORCINE) 1000 UNIT/ML IJ SOLN
INTRAMUSCULAR | Status: DC | PRN
  Administered 2020-10-09: 4500 [IU] via INTRAVENOUS

## 2020-10-09 MED ORDER — ONDANSETRON HCL 4 MG/2ML IJ SOLN: 4.0000 mg | Freq: Four times a day (QID) | INTRAMUSCULAR | Status: DC | PRN

## 2020-10-09 MED ORDER — SODIUM CHLORIDE 0.9 % IV SOLN: 250.0000 mL | INTRAVENOUS | Status: DC | PRN

## 2020-10-09 MED ORDER — HYDRALAZINE HCL 20 MG/ML IJ SOLN: 10.0000 mg | INTRAMUSCULAR | Status: AC | PRN

## 2020-10-09 MED ORDER — METOPROLOL SUCCINATE ER 25 MG PO TB24
12.5000 mg | ORAL_TABLET | Freq: Every day | ORAL | Status: DC
  Administered 2020-10-09 – 2020-10-11 (×3): 12.5 mg via ORAL
  Filled 2020-10-09 (×3): qty 1

## 2020-10-09 MED ORDER — PANTOPRAZOLE SODIUM 40 MG PO TBEC
40.0000 mg | DELAYED_RELEASE_TABLET | Freq: Every day | ORAL | Status: DC
Start: 2020-10-09 — End: 2020-10-11
  Administered 2020-10-09 – 2020-10-11 (×3): 40 mg via ORAL
  Filled 2020-10-09 (×3): qty 1

## 2020-10-09 MED ORDER — BISACODYL 10 MG RE SUPP: 10.0000 mg | Freq: Every day | RECTAL | Status: DC | PRN

## 2020-10-09 MED ORDER — SODIUM CHLORIDE 0.9 % IV SOLN
INTRAVENOUS | Status: DC
Start: 2020-10-10 — End: 2020-10-10

## 2020-10-09 MED ORDER — LIDOCAINE HCL (PF) 1 % IJ SOLN
INTRAMUSCULAR | Status: DC | PRN
  Administered 2020-10-09: 4 mL

## 2020-10-09 MED ORDER — GABAPENTIN 600 MG PO TABS
600.0000 mg | ORAL_TABLET | Freq: Every day | ORAL | Status: DC
  Administered 2020-10-10 – 2020-10-11 (×2): 600 mg via ORAL
  Filled 2020-10-09 (×3): qty 1

## 2020-10-09 MED ORDER — HEPARIN (PORCINE) IN NACL 1000-0.9 UT/500ML-% IV SOLN
INTRAVENOUS | Status: AC
  Filled 2020-10-09: qty 1000

## 2020-10-09 MED ORDER — ASPIRIN 81 MG PO CHEW: 81.0000 mg | CHEWABLE_TABLET | ORAL | Status: DC

## 2020-10-09 MED ORDER — TRAZODONE HCL 50 MG PO TABS: 50.0000 mg | ORAL_TABLET | Freq: Every evening | ORAL | Status: DC | PRN

## 2020-10-09 MED ORDER — SODIUM CHLORIDE 0.9 % IV SOLN: INTRAVENOUS | Status: DC

## 2020-10-09 SURGICAL SUPPLY — 11 items
CATH 5FR JL3.5 JR4 ANG PIG MP (CATHETERS) ×1 IMPLANT
CATH BALLN WEDGE 5F 110CM (CATHETERS) ×1 IMPLANT
DEVICE RAD COMP TR BAND LRG (VASCULAR PRODUCTS) ×1 IMPLANT
GLIDESHEATH SLEND SS 6F .021 (SHEATH) ×1 IMPLANT
GUIDEWIRE INQWIRE 1.5J.035X260 (WIRE) IMPLANT
INQWIRE 1.5J .035X260CM (WIRE) ×2
KIT HEART LEFT (KITS) ×2 IMPLANT
PACK CARDIAC CATHETERIZATION (CUSTOM PROCEDURE TRAY) ×2 IMPLANT
SHEATH GLIDE SLENDER 4/5FR (SHEATH) ×2 IMPLANT
TRANSDUCER W/STOPCOCK (MISCELLANEOUS) ×2 IMPLANT
TUBING CIL FLEX 10 FLL-RA (TUBING) ×2 IMPLANT

## 2020-10-09 NOTE — Interval H&P Note (Signed)
History and Physical Interval Note:  10/09/2020 10:26 AM  [name]  has presented today for surgery, with the diagnosis of HFrEF.  The various methods of treatment have been discussed with the patient and family. After consideration of risks, benefits and other options for treatment, the patient has consented to  Procedure(s): RIGHT/LEFT HEART CATH AND CORONARY ANGIOGRAPHY (N/A) as a surgical intervention.  The patient's history has been reviewed, patient examined, no change in status, stable for surgery.  I have reviewed the patient's chart and labs.  Questions were answered to the patient's satisfaction.    Cath Lab Visit (complete for each Cath Lab visit)  Clinical Evaluation Leading to the Procedure:   ACS: No.  Non-ACS:    Anginal/Heart Failure Classification: NYHA class III  Anti-ischemic medical therapy: Minimal Therapy (1 class of medications)  Non-Invasive Test Results: No non-invasive testing performed (LVEF 25-30% by echo in 12/2019 -> high risk)  Prior CABG: No previous CABG  Samuel Mcpeek

## 2020-10-09 NOTE — Progress Notes (Signed)
Progress Note  Patient Name: [name] Date of Encounter: 10/09/2020  [facility] Cardiologist: Jenkins Rouge, MD  Subjective   [name] is feeling well.  Dizziness that complicated cath this morning has not returned.  No chest pain or shortness of breath.  Inpatient Medications    Scheduled Meds:  ARIPiprazole  5 mg Oral Daily   [START ON 10/10/2020] aspirin EC  81 mg Oral Daily   DULoxetine  20 mg Oral Daily   [START ON 10/10/2020] empagliflozin  10 mg Oral Daily   [START ON 10/10/2020] gabapentin  600 mg Oral Daily   insulin aspart  0-15 Units Subcutaneous TID WC   insulin glargine-yfgn  25 Units Subcutaneous QHS   lamoTRIgine  100 mg Oral BID   metoprolol succinate  12.5 mg Oral Daily   [START ON 10/10/2020] multivitamin with minerals  1 tablet Oral Daily   pantoprazole  40 mg Oral Daily   rosuvastatin  40 mg Oral Daily   sodium chloride flush  3 mL Intravenous Q12H   sodium chloride flush  3 mL Intravenous Q12H   Continuous Infusions:  sodium chloride     PRN Meds: sodium chloride, acetaminophen, bisacodyl, ondansetron (ZOFRAN) IV, polyethylene glycol, sodium chloride flush, traZODone   Vital Signs    Vitals:   10/09/20 1320 10/09/20 1408 10/09/20 1505 10/09/20 1629  BP: 125/75 127/74 (!) 146/83 136/80  Pulse: 76 97 (!) 105 95  Resp: '18 18 18 20  '$ Temp:    98.2 F (36.8 C)  TempSrc:    Oral  SpO2: 99% 97% 99% 98%  Weight:      Height:        Intake/Output Summary (Last 24 hours) at 10/09/2020 1822 Last data filed at 10/09/2020 1600 Gross per 24 hour  Intake 469.5 ml  Output --  Net 469.5 ml   Last 3 Weights 10/09/2020 10/07/2020 08/29/2020  Weight (lbs) 195 lb 195 lb 9.6 oz 231 lb 7.7 oz  Weight (kg) 88.451 kg 88.724 kg 105 kg      Telemetry    NSR with PVC's. - Personally Reviewed  ECG   No new tracing  Physical Exam   GEN: No acute distress.   Neck: No JVD Cardiac: RRR, no murmurs, rubs, or gallops.  Respiratory: Clear to  auscultation bilaterally. GI: Soft, nontender, non-distended  MS: No edema; No deformity. Neuro:  Nonfocal  Psych: Normal affect   Labs    High Sensitivity Troponin:  No results for input(s): TROPONINIHS in the last 720 hours.    Chemistry Recent Labs  Lab 10/07/20 1042 10/09/20 1056  NA 139 143  K 4.1 3.8  CL 102  --   CO2 24  --   GLUCOSE 193*  --   BUN 11  --   CREATININE 0.95  --   CALCIUM 9.7  --      Hematology Recent Labs  Lab 10/07/20 1042 10/09/20 1056  WBC 5.0  --   RBC 5.18  --   HGB 14.5 13.9  HCT 44.3 41.0  MCV 86  --   MCH 28.0  --   MCHC 32.7  --   RDW 14.6  --   PLT 194  --     BNPNo results for input(s): BNP, PROBNP in the last 168 hours.   DDimer No results for input(s): DDIMER in the last 168 hours.   Radiology    CARDIAC CATHETERIZATION  Result Date: 10/09/2020   Prox RCA lesion is 100% stenosed.  There is no aortic valve stenosis. 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  Unable to evaluate the left coronary artery due to patient becoming extremely dizzy and extremely agitated.  He demanded that the procedure be stopped; it was also felt to be unsafe to move forward with further coronary angiography at this time. Mildly elevated left heart and pulmonary artery pressures. Normal right heart filling pressure. Low normal Fick cardiac output/index. Recommendations: Admit for overnight observation and plan for completion of coronary angiography and possible PCI tomorrow with assistance of anesthesia. Maintain net even to slightly negative fluid balance. Escalate goal-directed medical therapy as tolerated. Secondary prevention of coronary artery disease. Nelva Bush, MD [facility]  ECHOCARDIOGRAM COMPLETE  Result Date: 10/09/2020    ECHOCARDIOGRAM REPORT   Patient Name:   [name] Date of Exam: 10/09/2020 Medical Rec #:  [id number]        Height:       70.0 in Accession #:    [id number]       Weight:        195.0 lb Date of Birth:  [birth year]        BSA:          2.065 m Patient Age:    65 years         BP:           136/80 mmHg Patient Gender: M                HR:           97 bpm. Exam Location:  Inpatient Procedure: 2D Echo, Cardiac Doppler and Color Doppler Indications:    Congestive Heart Failure I50.9  History:        Patient has prior history of Echocardiogram examinations, most                 recent 01/05/2020. Risk Factors:Diabetes.  Sonographer:    Merrie Roof RDCS Referring Phys: [place]  1. Left ventricular ejection fraction, by estimation, is 25 to 30%. The left ventricle has severely decreased function. The left ventricle demonstrates global hypokinesis. The left ventricular internal cavity size was mildly dilated. Left ventricular diastolic parameters are consistent with Grade I diastolic dysfunction (impaired relaxation).  2. Right ventricular systolic function is normal. The right ventricular size is normal.  3. The mitral valve is normal in structure. Mild to moderate mitral valve regurgitation. No evidence of mitral stenosis.  4. The aortic valve is tricuspid. Aortic valve regurgitation is not visualized. No aortic stenosis is present.  5. The inferior vena cava is normal in size with greater than 50% respiratory variability, suggesting right atrial pressure of 3 mmHg. FINDINGS  Left Ventricle: Left ventricular ejection fraction, by estimation, is 25 to 30%. The left ventricle has severely decreased function. The left ventricle demonstrates global hypokinesis. The left ventricular internal cavity size was mildly dilated. There is no left ventricular hypertrophy. Left ventricular diastolic parameters are consistent with Grade I diastolic dysfunction (impaired relaxation). Right Ventricle: The right ventricular size is normal. Right ventricular systolic function is normal. Left Atrium: Left atrial size was normal in size. Right Atrium: Right atrial size was normal in  size. Pericardium: There is no evidence of pericardial effusion. Mitral Valve: The mitral valve is normal in structure. Mild to moderate mitral valve regurgitation. No evidence of mitral valve stenosis. Tricuspid Valve: The tricuspid valve is normal in structure. Tricuspid valve regurgitation is trivial. No evidence of tricuspid stenosis.  Aortic Valve: The aortic valve is tricuspid. Aortic valve regurgitation is not visualized. No aortic stenosis is present. Aortic valve mean gradient measures 3.0 mmHg. Aortic valve peak gradient measures 5.7 mmHg. Pulmonic Valve: The pulmonic valve was normal in structure. Pulmonic valve regurgitation is trivial. No evidence of pulmonic stenosis. Aorta: The aortic root is normal in size and structure. Venous: The inferior vena cava is normal in size with greater than 50% respiratory variability, suggesting right atrial pressure of 3 mmHg. IAS/Shunts: No atrial level shunt detected by color flow Doppler.  LEFT VENTRICLE PLAX 2D LVIDd:         5.50 cm      Diastology LVIDs:         4.50 cm      LV e' medial:    7.40 cm/s LV PW:         1.00 cm      LV E/e' medial:  12.3 LV IVS:        1.00 cm      LV e' lateral:   10.70 cm/s LVOT diam:     2.00 cm      LV E/e' lateral: 8.5 LV SV:         38 LV SV Index:   18 LVOT Area:     3.14 cm  LV Volumes (MOD) LV vol d, MOD A2C: 178.0 ml LV vol d, MOD A4C: 130.0 ml LV vol s, MOD A2C: 92.3 ml LV vol s, MOD A4C: 84.1 ml LV SV MOD A2C:     85.7 ml LV SV MOD A4C:     130.0 ml LV SV MOD BP:      71.6 ml RIGHT VENTRICLE RV Basal diam:  3.50 cm LEFT ATRIUM             Index       RIGHT ATRIUM           Index LA diam:        4.70 cm 2.28 cm/m  RA Area:     18.50 cm LA Vol (A2C):   66.4 ml 32.16 ml/m RA Volume:   48.10 ml  23.29 ml/m LA Vol (A4C):   63.5 ml 30.75 ml/m LA Biplane Vol: 65.0 ml 31.48 ml/m  AORTIC VALVE AV Area (Vmax):    1.78 cm AV Area (Vmean):   1.69 cm AV Area (VTI):     1.71 cm AV Vmax:           119.00 cm/s AV Vmean:           79.800 cm/s AV VTI:            0.220 m AV Peak Grad:      5.7 mmHg AV Mean Grad:      3.0 mmHg LVOT Vmax:         67.60 cm/s LVOT Vmean:        43.000 cm/s LVOT VTI:          0.120 m LVOT/AV VTI ratio: 0.55  AORTA Ao Root diam: 2.90 cm Ao Asc diam:  3.60 cm MITRAL VALVE MV Area (PHT): 4.80 cm     SHUNTS MV Decel Time: 158 msec     Systemic VTI:  0.12 m MV E velocity: 91.20 cm/s   Systemic Diam: 2.00 cm MV A velocity: 115.00 cm/s MV E/A ratio:  0.79 Kirk Ruths MD Electronically signed by Kirk Ruths MD Signature Date/Time: 10/09/2020/5:43:15 PM    Final     Cardiac Studies  See echo and cath above  Patient Profile     64 y.o. male with history of chronic HFrEF and polysubstance abuse in remission, admitted following acute dizziness during catheterization leading to premature termination of the procedure.  Assessment & Plan    Coronary artery disease: Only a single angiogram could be obtained today, as the patient became acutely dizzy and agitated after a single injection of the right coronary artery.  At the patient's request and for his own safety, the cath was aborted before the left coronary artery was interrogated.  RCA noted to be chronically occluded in the proximal segment.  Given his severely reduced LVEF, I am concerned that he also has severe LCA disease.  Due to his severe dizziness ultimately attributed to anxiety/panic as well as refusal of narcotics due to his prior polysubstance abuse, we have agreed to proceed with completion of diagnostic catheterization as well as possible PCI tomorrow with deep sedation provided by anesthesia.  Case tentatively scheduled at 2 PM with Dr. Irish Lack. Continue aspirin 81 mg daily and rosuvastatin. Clear liquid diet beginning at midnight, NPO after 9 AM tomorrow in anticipation of cath.  Shared Decision Making/Informed Consent The risks [stroke (1 in 1000), death (1 in 1000), kidney failure [usually temporary] (1 in 500), bleeding (1 in 200),  allergic reaction [possibly serious] (1 in 200)], benefits (diagnostic support and management of coronary artery disease) and alternatives of a cardiac catheterization were discussed in detail with [name] and he is willing to proceed.  Chronic HFrEF: Mildly elevated left heart and pulmonary artery pressures noted on RHC today with low normal CO/CI.  LVEF still 25-30% by echo. Continue metoprolol and empagliflozin. Maintain net even fluid balance. Ideally would like to add Entresto before discharge if renal function and blood pressure tolerate.  DM: Hold metformin. Continue empagliflozin. SSI and home dose of QHS Lantus.  Dizziness: Occurred within a few seconds of RCA angiography and resolved spontaneously within 10 minutes.  No focal neuro deficits observed.  Patient notes similar dizziness in the past with anxiety/panic attacks. Continue home doses of lamotrigine, Cymbalta, and Abilify.  For questions or updates, please contact [facility] [address] Please consult [email or web address] for contact info under      Signed, Nelva Bush, MD  10/09/2020, 6:22 PM

## 2020-10-09 NOTE — Progress Notes (Signed)
Dr. Saunders Revel at bedside, spoke with pt, VSS, no c/o pain or signs of distress, see flowsheet, PO food given, plan for heart cath with anesthesia on 10/10/2020, safety maintained

## 2020-10-09 NOTE — Progress Notes (Signed)
Patient transferred from cath lab at 1237hrs.  TR band in place to right wrist, level zero.  Reviewed post cath instructions with patient and oriented to unit.  Patient verbalized understanding.

## 2020-10-09 NOTE — Progress Notes (Signed)
  Echocardiogram 2D Echocardiogram has been performed.  [name] F 10/09/2020, 5:27 PM

## 2020-10-10 ENCOUNTER — Observation Stay (HOSPITAL_COMMUNITY): Payer: No Typology Code available for payment source | Admitting: Anesthesiology

## 2020-10-10 ENCOUNTER — Encounter (HOSPITAL_COMMUNITY)
Admission: RE | Disposition: A | Discharge status: Home / Self Care | Payer: Self-pay | Source: Home / Self Care | Attending: Internal Medicine

## 2020-10-10 ENCOUNTER — Encounter (HOSPITAL_COMMUNITY): Payer: Self-pay | Admitting: Internal Medicine

## 2020-10-10 DIAGNOSIS — I2510 Atherosclerotic heart disease of native coronary artery without angina pectoris: Secondary | ICD-10-CM | POA: Diagnosis not present

## 2020-10-10 DIAGNOSIS — Z7984 Long term (current) use of oral hypoglycemic drugs: Secondary | ICD-10-CM | POA: Diagnosis not present

## 2020-10-10 DIAGNOSIS — Z20822 Contact with and (suspected) exposure to covid-19: Secondary | ICD-10-CM | POA: Diagnosis not present

## 2020-10-10 DIAGNOSIS — I25110 Atherosclerotic heart disease of native coronary artery with unstable angina pectoris: Secondary | ICD-10-CM | POA: Diagnosis not present

## 2020-10-10 DIAGNOSIS — E119 Type 2 diabetes mellitus without complications: Secondary | ICD-10-CM | POA: Diagnosis not present

## 2020-10-10 DIAGNOSIS — I5020 Unspecified systolic (congestive) heart failure: Secondary | ICD-10-CM | POA: Diagnosis not present

## 2020-10-10 HISTORY — PX: CORONARY STENT INTERVENTION: CATH118234

## 2020-10-10 HISTORY — PX: LEFT HEART CATH AND CORONARY ANGIOGRAPHY: CATH118249

## 2020-10-10 LAB — GLUCOSE, CAPILLARY
Glucose-Capillary: 140 mg/dL — ABNORMAL HIGH (ref 70–99)
Glucose-Capillary: 75 mg/dL (ref 70–99)
Glucose-Capillary: 78 mg/dL (ref 70–99)
Glucose-Capillary: 79 mg/dL (ref 70–99)
Glucose-Capillary: 250 mg/dL — ABNORMAL HIGH (ref 70–99)

## 2020-10-10 LAB — HEPATIC FUNCTION PANEL
ALT: 19 U/L (ref 0–44)
AST: 21 U/L (ref 15–41)
Albumin: 3.3 g/dL — ABNORMAL LOW (ref 3.5–5.0)
Alkaline Phosphatase: 52 U/L (ref 38–126)
Bilirubin, Direct: 0.1 mg/dL (ref 0.0–0.2)
Indirect Bilirubin: 0.7 mg/dL (ref 0.3–0.9)
Total Bilirubin: 0.8 mg/dL (ref 0.3–1.2)
Total Protein: 6 g/dL — ABNORMAL LOW (ref 6.5–8.1)

## 2020-10-10 LAB — BASIC METABOLIC PANEL
Anion gap: 7 (ref 5–15)
BUN: 13 mg/dL (ref 8–23)
CO2: 24 mmol/L (ref 22–32)
Calcium: 9.2 mg/dL (ref 8.9–10.3)
Chloride: 105 mmol/L (ref 98–111)
Creatinine, Ser: 0.93 mg/dL (ref 0.61–1.24)
GFR, Estimated: >60 mL/min (ref >60)
Glucose, Bld: 132 mg/dL — ABNORMAL HIGH (ref 70–99)
Potassium: 4.1 mmol/L (ref 3.5–5.1)
Sodium: 136 mmol/L (ref 135–145)

## 2020-10-10 LAB — POCT ACTIVATED CLOTTING TIME
Activated Clotting Time: 202 seconds
Activated Clotting Time: 457 seconds

## 2020-10-10 LAB — POCT I-STAT 7, (LYTES, BLD GAS, ICA,H+H)
Acid-base deficit: 1 mmol/L (ref 0.0–2.0)
Bicarbonate: 23.2 mmol/L (ref 20.0–28.0)
Calcium, Ion: 1.23 mmol/L (ref 1.15–1.40)
HCT: 41 % (ref 39.0–52.0)
Hemoglobin: 13.9 g/dL (ref 13.0–17.0)
O2 Saturation: 98 %
Potassium: 3.8 mmol/L (ref 3.5–5.1)
Sodium: 142 mmol/L (ref 135–145)
TCO2: 24 mmol/L (ref 22–32)
pCO2 arterial: 35.2 mmHg (ref 32.0–48.0)
pH, Arterial: 7.428 (ref 7.350–7.450)
pO2, Arterial: 100 mmHg (ref 83.0–108.0)

## 2020-10-10 LAB — HEMOGLOBIN A1C
Hgb A1c MFr Bld: 8.2 % — ABNORMAL HIGH (ref 4.8–5.6)
Mean Plasma Glucose: 188.64 mg/dL

## 2020-10-10 LAB — CBC
HCT: 43 % (ref 39.0–52.0)
Hemoglobin: 14.6 g/dL (ref 13.0–17.0)
MCH: 28.6 pg (ref 26.0–34.0)
MCHC: 34 g/dL (ref 30.0–36.0)
MCV: 84.3 fL (ref 80.0–100.0)
Platelets: 173 10*3/uL (ref 150–400)
RBC: 5.1 MIL/uL (ref 4.22–5.81)
RDW: 14.3 % (ref 11.5–15.5)
WBC: 5.2 10*3/uL (ref 4.0–10.5)
nRBC: 0 % (ref 0.0–0.2)

## 2020-10-10 LAB — SURGICAL PCR SCREEN
MRSA, PCR: NEGATIVE
Staphylococcus aureus: NEGATIVE

## 2020-10-10 LAB — LIPID PANEL
Cholesterol: 135 mg/dL (ref 0–200)
HDL: 34 mg/dL — ABNORMAL LOW (ref >40)
LDL Cholesterol: 82 mg/dL (ref 0–99)
Total CHOL/HDL Ratio: 4 RATIO
Triglycerides: 95 mg/dL (ref <150)
VLDL: 19 mg/dL (ref 0–40)

## 2020-10-10 SURGERY — LEFT HEART CATH AND CORONARY ANGIOGRAPHY
Anesthesia: Monitor Anesthesia Care

## 2020-10-10 MED ORDER — HEPARIN SODIUM (PORCINE) 1000 UNIT/ML IJ SOLN
INTRAMUSCULAR | Status: DC | PRN
  Administered 2020-10-10: 6000 [IU] via INTRAVENOUS
  Administered 2020-10-10: 5000 [IU] via INTRAVENOUS

## 2020-10-10 MED ORDER — MIDAZOLAM HCL 5 MG/5ML IJ SOLN
INTRAMUSCULAR | Status: DC | PRN
  Administered 2020-10-10: 2 mg via INTRAVENOUS

## 2020-10-10 MED ORDER — HEPARIN (PORCINE) IN NACL 1000-0.9 UT/500ML-% IV SOLN
INTRAVENOUS | Status: AC
  Filled 2020-10-10: qty 500

## 2020-10-10 MED ORDER — ACETAMINOPHEN 325 MG PO TABS
650.0000 mg | ORAL_TABLET | ORAL | Status: DC | PRN
  Administered 2020-10-10: 650 mg via ORAL
  Filled 2020-10-10: qty 2

## 2020-10-10 MED ORDER — NITROGLYCERIN 1 MG/10 ML FOR IR/CATH LAB
INTRA_ARTERIAL | Status: DC | PRN
  Administered 2020-10-10 (×3): 200 ug via INTRACORONARY

## 2020-10-10 MED ORDER — PHENYLEPHRINE 40 MCG/ML (10ML) SYRINGE FOR IV PUSH (FOR BLOOD PRESSURE SUPPORT)
PREFILLED_SYRINGE | INTRAVENOUS | Status: DC | PRN
  Administered 2020-10-10: 80 ug via INTRAVENOUS
  Administered 2020-10-10: 120 ug via INTRAVENOUS
  Administered 2020-10-10: 80 ug via INTRAVENOUS

## 2020-10-10 MED ORDER — VERAPAMIL HCL 2.5 MG/ML IV SOLN
INTRAVENOUS | Status: AC
  Filled 2020-10-10: qty 2

## 2020-10-10 MED ORDER — HYDRALAZINE HCL 20 MG/ML IJ SOLN: 10.0000 mg | INTRAMUSCULAR | Status: AC | PRN

## 2020-10-10 MED ORDER — CANGRELOR BOLUS VIA INFUSION
INTRAVENOUS | Status: DC | PRN
  Administered 2020-10-10: 2655 ug via INTRAVENOUS

## 2020-10-10 MED ORDER — CHLORHEXIDINE GLUCONATE 0.12 % MT SOLN
OROMUCOSAL | Status: AC
  Administered 2020-10-10: 15 mL
  Filled 2020-10-10: qty 15

## 2020-10-10 MED ORDER — SODIUM CHLORIDE 0.9 % IV SOLN
INTRAVENOUS | Status: AC | PRN
  Administered 2020-10-10: 4 ug/kg/min via INTRAVENOUS

## 2020-10-10 MED ORDER — TICAGRELOR 90 MG PO TABS
90.0000 mg | ORAL_TABLET | Freq: Two times a day (BID) | ORAL | Status: DC
  Administered 2020-10-10 – 2020-10-11 (×2): 90 mg via ORAL
  Filled 2020-10-10 (×2): qty 1

## 2020-10-10 MED ORDER — MIDAZOLAM HCL 2 MG/2ML IJ SOLN
INTRAMUSCULAR | Status: AC
  Filled 2020-10-10: qty 2

## 2020-10-10 MED ORDER — SODIUM CHLORIDE 0.9% FLUSH: 3.0000 mL | INTRAVENOUS | Status: DC | PRN

## 2020-10-10 MED ORDER — SODIUM CHLORIDE 0.9 % IV SOLN: INTRAVENOUS | Status: AC

## 2020-10-10 MED ORDER — PROPOFOL 500 MG/50ML IV EMUL
INTRAVENOUS | Status: DC | PRN
  Administered 2020-10-10: 50 ug/kg/min via INTRAVENOUS

## 2020-10-10 MED ORDER — ASPIRIN 81 MG PO CHEW
81.0000 mg | CHEWABLE_TABLET | Freq: Every day | ORAL | Status: DC
  Administered 2020-10-11: 81 mg via ORAL
  Filled 2020-10-10: qty 1

## 2020-10-10 MED ORDER — HEPARIN (PORCINE) IN NACL 1000-0.9 UT/500ML-% IV SOLN
INTRAVENOUS | Status: DC | PRN
  Administered 2020-10-10 (×2): 500 mL

## 2020-10-10 MED ORDER — PROPOFOL 10 MG/ML IV BOLUS
INTRAVENOUS | Status: DC | PRN
  Administered 2020-10-10: 20 mg via INTRAVENOUS

## 2020-10-10 MED ORDER — TICAGRELOR 90 MG PO TABS
180.0000 mg | ORAL_TABLET | Freq: Once | ORAL | Status: AC
  Administered 2020-10-10: 180 mg via ORAL

## 2020-10-10 MED ORDER — ONDANSETRON HCL 4 MG/2ML IJ SOLN: 4.0000 mg | Freq: Four times a day (QID) | INTRAMUSCULAR | Status: DC | PRN

## 2020-10-10 MED ORDER — IOHEXOL 350 MG/ML SOLN
INTRAVENOUS | Status: DC | PRN
  Administered 2020-10-10: 110 mL

## 2020-10-10 MED ORDER — LIDOCAINE HCL (PF) 1 % IJ SOLN
INTRAMUSCULAR | Status: DC | PRN
  Administered 2020-10-10: 2 mL via INTRADERMAL

## 2020-10-10 MED ORDER — VERAPAMIL HCL 2.5 MG/ML IV SOLN
INTRAVENOUS | Status: DC | PRN
  Administered 2020-10-10: 10 mL via INTRA_ARTERIAL

## 2020-10-10 MED ORDER — TICAGRELOR 90 MG PO TABS
ORAL_TABLET | ORAL | Status: AC
  Filled 2020-10-10: qty 2

## 2020-10-10 MED ORDER — CANGRELOR TETRASODIUM 50 MG IV SOLR
INTRAVENOUS | Status: AC
  Filled 2020-10-10: qty 50

## 2020-10-10 MED ORDER — ONDANSETRON HCL 4 MG/2ML IJ SOLN
INTRAMUSCULAR | Status: DC | PRN
Start: 2020-10-10 — End: 2020-10-10
  Administered 2020-10-10: 4 mg via INTRAVENOUS

## 2020-10-10 MED ORDER — LIDOCAINE HCL 1 % IJ SOLN
INTRAMUSCULAR | Status: AC
  Filled 2020-10-10: qty 20

## 2020-10-10 MED ORDER — SODIUM CHLORIDE 0.9% FLUSH
3.0000 mL | Freq: Two times a day (BID) | INTRAVENOUS | Status: DC
  Administered 2020-10-10 – 2020-10-11 (×2): 3 mL via INTRAVENOUS

## 2020-10-10 MED ORDER — LABETALOL HCL 5 MG/ML IV SOLN: 10.0000 mg | INTRAVENOUS | Status: AC | PRN

## 2020-10-10 MED ORDER — SODIUM CHLORIDE 0.9 % IV SOLN: 250.0000 mL | INTRAVENOUS | Status: DC | PRN

## 2020-10-10 MED ORDER — NITROGLYCERIN 1 MG/10 ML FOR IR/CATH LAB
INTRA_ARTERIAL | Status: AC
  Filled 2020-10-10: qty 10

## 2020-10-10 MED ORDER — LACTATED RINGERS IV SOLN: INTRAVENOUS | Status: DC

## 2020-10-10 MED ORDER — FENTANYL CITRATE (PF) 100 MCG/2ML IJ SOLN
INTRAMUSCULAR | Status: AC
  Filled 2020-10-10: qty 2

## 2020-10-10 MED FILL — Heparin Sodium (Porcine) Inj 1000 Unit/ML: INTRAMUSCULAR | Qty: 10 | Status: AC

## 2020-10-10 SURGICAL SUPPLY — 20 items
BALLN SAPPHIRE 2.5X12 (BALLOONS) ×2
BALLN SAPPHIRE ~~LOC~~ 3.25X15 (BALLOONS) ×1 IMPLANT
BALLOON SAPPHIRE 2.5X12 (BALLOONS) IMPLANT
CATH 5FR JL3.5 JR4 ANG PIG MP (CATHETERS) ×1 IMPLANT
CATH INFINITI 5FR JL4 (CATHETERS) ×1 IMPLANT
CATH LAUNCHER 6FR EBU3.5 (CATHETERS) ×1 IMPLANT
DEVICE RAD COMP TR BAND LRG (VASCULAR PRODUCTS) ×2 IMPLANT
GLIDESHEATH SLEND SS 6F .021 (SHEATH) ×1 IMPLANT
GUIDEWIRE INQWIRE 1.5J.035X260 (WIRE) IMPLANT
INQWIRE 1.5J .035X260CM (WIRE) ×2
KIT ENCORE 26 ADVANTAGE (KITS) ×2 IMPLANT
KIT HEART LEFT (KITS) ×2 IMPLANT
KIT HEMO VALVE WATCHDOG (MISCELLANEOUS) ×1 IMPLANT
PACK CARDIAC CATHETERIZATION (CUSTOM PROCEDURE TRAY) ×2 IMPLANT
STENT ONYX FRONTIER 2.75X12 (Permanent Stent) ×1 IMPLANT
STENT ONYX FRONTIER 3.0X26 (Permanent Stent) ×1 IMPLANT
SYR MEDRAD MARK 7 150ML (SYRINGE) ×2 IMPLANT
TRANSDUCER W/STOPCOCK (MISCELLANEOUS) ×2 IMPLANT
TUBING CIL FLEX 10 FLL-RA (TUBING) ×2 IMPLANT
WIRE ASAHI PROWATER 180CM (WIRE) ×1 IMPLANT

## 2020-10-10 NOTE — Care Management (Signed)
10-10-20 1647 Case Manager notified the [facility] that the patient is hospitalized under Observation status. [facility] approval number is [id number].

## 2020-10-10 NOTE — Anesthesia Preprocedure Evaluation (Signed)
Anesthesia Evaluation  Patient identified by MRN, date of birth, ID band Patient awake    Reviewed: Allergy & Precautions, NPO status , Patient's Chart, lab work & pertinent test results  History of Anesthesia Complications Negative for: history of anesthetic complications  Airway Mallampati: III  TM Distance: >3 FB Neck ROM: Full    Dental  (+) Dental Advisory Given, Teeth Intact,    Pulmonary shortness of breath, neg COPD, neg recent URI, Current Smoker and Patient abstained from smoking.,    breath sounds clear to auscultation       Cardiovascular hypertension, Pt. on medications + CAD   Rhythm:Regular  1. Left ventricular ejection fraction, by estimation, is 25 to 30%. The  left ventricle has severely decreased function. The left ventricle  demonstrates global hypokinesis. The left ventricular internal cavity size  was mildly dilated. Left ventricular  diastolic parameters are consistent with Grade I diastolic dysfunction  (impaired relaxation).  2. Right ventricular systolic function is normal. The right ventricular  size is normal.  3. The mitral valve is normal in structure. Mild to moderate mitral valve  regurgitation. No evidence of mitral stenosis.  4. The aortic valve is tricuspid. Aortic valve regurgitation is not  visualized. No aortic stenosis is present.  5. The inferior vena cava is normal in size with greater than 50%  respiratory variability, suggesting right atrial pressure of 3 mmHg.       Neuro/Psych PSYCHIATRIC DISORDERS Anxiety Depression Bipolar Disorder negative neurological ROS     GI/Hepatic Neg liver ROS, GERD  Medicated and Controlled,  Endo/Other  diabetes, Poorly ControlledLab Results      Component                Value               Date                      HGBA1C                   8.2 (H)             10/10/2020             Renal/GU Lab Results      Component                Value                Date                      CREATININE               0.93                10/10/2020                Musculoskeletal  (+) Arthritis ,   Abdominal   Peds  Hematology Lab Results      Component                Value               Date                      WBC                      5.2  10/10/2020                HGB                      14.6                10/10/2020                HCT                      43.0                10/10/2020                MCV                      84.3                10/10/2020                PLT                      173                 10/10/2020              Anesthesia Other Findings   Reproductive/Obstetrics                             Anesthesia Physical Anesthesia Plan  ASA: 3  Anesthesia Plan: MAC   Post-op Pain Management:    Induction: Intravenous  PONV Risk Score and Plan: 0 and Propofol infusion and Treatment may vary due to age or medical condition  Airway Management Planned: Nasal Cannula  Additional Equipment: None  Intra-op Plan:   Post-operative Plan:   Informed Consent: I have reviewed the patients History and Physical, chart, labs and discussed the procedure including the risks, benefits and alternatives for the proposed anesthesia with the patient or authorized representative who has indicated his/her understanding and acceptance.     Dental advisory given  Plan Discussed with: CRNA and Anesthesiologist  Anesthesia Plan Comments:         Anesthesia Quick Evaluation

## 2020-10-10 NOTE — Anesthesia Procedure Notes (Signed)
Arterial Line Insertion Start/End8/26/2022 12:16 PM, 10/10/2020 12:16 PM Performed by: CRNA  Patient location: O[facility] procedure area. Preanesthetic checklist: patient identified, IV checked, site marked, risks and benefits discussed, surgical consent, monitors and equipment checked, pre-op evaluation, timeout performed and anesthesia consent Lidocaine 1% used for infiltration Left, radial was placed Catheter size: 20 G Hand hygiene performed  and maximum sterile barriers used  Allen's test indicative of satisfactory collateral circulation Attempts: 1 Procedure performed without using ultrasound guided technique. Following insertion, dressing applied and Biopatch. Post procedure assessment: normal  Patient tolerated the procedure well with no immediate complications.

## 2020-10-10 NOTE — Progress Notes (Addendum)
Progress Note  Patient Name: [name] Date of Encounter: 10/10/2020  [facility] Cardiologist: Jenkins Rouge, MD  Subjective   No sx, wonders when cath will be. No SOB, lying flat comfortably, not on O2  Inpatient Medications    Scheduled Meds:  ARIPiprazole  5 mg Oral Daily   aspirin EC  81 mg Oral Daily   DULoxetine  20 mg Oral Daily   empagliflozin  10 mg Oral Daily   gabapentin  600 mg Oral Daily   insulin aspart  0-15 Units Subcutaneous TID WC   insulin glargine-yfgn  25 Units Subcutaneous QHS   lamoTRIgine  100 mg Oral BID   metoprolol succinate  12.5 mg Oral Daily   multivitamin with minerals  1 tablet Oral Daily   pantoprazole  40 mg Oral Daily   rosuvastatin  40 mg Oral Daily   sodium chloride flush  3 mL Intravenous Q12H   sodium chloride flush  3 mL Intravenous Q12H   Continuous Infusions:  sodium chloride     sodium chloride     sodium chloride 10 mL/hr at 10/10/20 0524   PRN Meds: sodium chloride, sodium chloride, acetaminophen, bisacodyl, ondansetron (ZOFRAN) IV, polyethylene glycol, sodium chloride flush, traZODone   Vital Signs    Vitals:   10/09/20 2010 10/10/20 0009 10/10/20 0448 10/10/20 0732  BP: 133/67 130/76 132/78 126/68  Pulse: 99 93 95 87  Resp: '18 18 18 16  '$ Temp: 98.4 F (36.9 C) 98.5 F (36.9 C) 98.3 F (36.8 C) 98.3 F (36.8 C)  TempSrc: Oral Oral Oral Oral  SpO2: 98% 94% 100%   Weight:      Height:        Intake/Output Summary (Last 24 hours) at 10/10/2020 0828 Last data filed at 10/10/2020 0524 Gross per 24 hour  Intake 711.62 ml  Output --  Net 711.62 ml   Last 3 Weights 10/09/2020 10/07/2020 08/29/2020  Weight (lbs) 195 lb 195 lb 9.6 oz 231 lb 7.7 oz  Weight (kg) 88.451 kg 88.724 kg 105 kg  Some encounter information is confidential and restricted. Go to Review Flowsheets activity to see all data.      Telemetry    SR, occ PVCs- Personally Reviewed  ECG   No new tracing  Physical Exam   General: Well  developed, well nourished, male in no acute distress Head: Eyes PERRLA, Head normocephalic and atraumatic Lungs: slightly decreased BS bases bilaterally to auscultation. Heart: HRRR S1 S2, without rub or gallop. No murmur. 4/4 extremity pulses are 2+ & equal. No JVD. Abdomen: Bowel sounds are present, abdomen soft and non-tender without masses or  hernias noted. Msk: Normal strength and tone for age. Extremities: No clubbing, cyanosis or edema. R radial cath site w/out ecchymosis or hematoma   Skin:  No rashes or lesions noted. Neuro: Alert and oriented X 3. Psych:  Good affect, responds appropriately  Labs    High Sensitivity Troponin:  No results for input(s): TROPONINIHS in the last 720 hours.    Chemistry Recent Labs  Lab 10/07/20 1042 10/09/20 1056 10/09/20 1059 10/10/20 0158  NA 139 143 142 136  K 4.1 3.8 3.8 4.1  CL 102  --   --  105  CO2 24  --   --  24  GLUCOSE 193*  --   --  132*  BUN 11  --   --  13  CREATININE 0.95  --   --  0.93  CALCIUM 9.7  --   --  9.2  GFRNONAA  --   --   --  >60  ANIONGAP  --   --   --  7    Lab Results  Component Value Date   ALT 22 01/06/2020   AST 23 01/06/2020   ALKPHOS 79 01/06/2020   BILITOT 1.5 (H) 01/06/2020    Hematology Recent Labs  Lab 10/07/20 1042 10/09/20 1056 10/09/20 1059 10/10/20 0158  WBC 5.0  --   --  5.2  RBC 5.18  --   --  5.10  HGB 14.5 13.9 13.9 14.6  HCT 44.3 41.0 41.0 43.0  MCV 86  --   --  84.3  MCH 28.0  --   --  28.6  MCHC 32.7  --   --  34.0  RDW 14.6  --   --  14.3  PLT 194  --   --  173   Lab Results  Component Value Date   CHOL 135 10/10/2020   HDL 34 (L) 10/10/2020   LDLCALC 82 10/10/2020   TRIG 95 10/10/2020   CHOLHDL 4.0 10/10/2020   Lab Results  Component Value Date   HGBA1C 8.2 (H) 10/10/2020   Lab Results  Component Value Date   TSH 1.484 10/11/2016   Drugs of Abuse     Component Value Date/Time   LABOPIA NONE DETECTED 08/30/2020 0431   COCAINSCRNUR NONE DETECTED  08/30/2020 0431   LABBENZ NONE DETECTED 08/30/2020 0431   AMPHETMU NONE DETECTED 08/30/2020 0431   THCU NONE DETECTED 08/30/2020 0431   LABBARB NONE DETECTED 08/30/2020 0431     BNPNo results for input(s): BNP, PROBNP in the last 168 hours.   DDimer No results for input(s): DDIMER in the last 168 hours.   Radiology    CARDIAC CATHETERIZATION  Result Date: 10/09/2020   Prox RCA lesion is 100% stenosed.   There is no aortic valve stenosis. 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  Unable to evaluate the left coronary artery due to patient becoming extremely dizzy and extremely agitated.  He demanded that the procedure be stopped; it was also felt to be unsafe to move forward with further coronary angiography at this time. Mildly elevated left heart and pulmonary artery pressures. Normal right heart filling pressure. Low normal Fick cardiac output/index. Recommendations: Admit for overnight observation and plan for completion of coronary angiography and possible PCI tomorrow with assistance of anesthesia. Maintain net even to slightly negative fluid balance. Escalate goal-directed medical therapy as tolerated. Secondary prevention of coronary artery disease. Nelva Bush, MD [facility]  ECHOCARDIOGRAM COMPLETE  Result Date: 10/09/2020    ECHOCARDIOGRAM REPORT   Patient Name:   [name] Date of Exam: 10/09/2020 Medical Rec #:  [id number]        Height:       70.0 in Accession #:    [id number]       Weight:       195.0 lb Date of Birth:  February 14, 1956        BSA:          2.065 m Patient Age:    65 years         BP:           136/80 mmHg Patient Gender: M                HR:           97 bpm. Exam Location:  Inpatient Procedure: 2D Echo, Cardiac Doppler and Color  Doppler Indications:    Congestive Heart Failure I50.9  History:        Patient has prior history of Echocardiogram examinations, most                 recent 01/05/2020. Risk Factors:Diabetes.   Sonographer:    Merrie Roof RDCS Referring Phys: [place]  1. Left ventricular ejection fraction, by estimation, is 25 to 30%. The left ventricle has severely decreased function. The left ventricle demonstrates global hypokinesis. The left ventricular internal cavity size was mildly dilated. Left ventricular diastolic parameters are consistent with Grade I diastolic dysfunction (impaired relaxation).  2. Right ventricular systolic function is normal. The right ventricular size is normal.  3. The mitral valve is normal in structure. Mild to moderate mitral valve regurgitation. No evidence of mitral stenosis.  4. The aortic valve is tricuspid. Aortic valve regurgitation is not visualized. No aortic stenosis is present.  5. The inferior vena cava is normal in size with greater than 50% respiratory variability, suggesting right atrial pressure of 3 mmHg. FINDINGS  Left Ventricle: Left ventricular ejection fraction, by estimation, is 25 to 30%. The left ventricle has severely decreased function. The left ventricle demonstrates global hypokinesis. The left ventricular internal cavity size was mildly dilated. There is no left ventricular hypertrophy. Left ventricular diastolic parameters are consistent with Grade I diastolic dysfunction (impaired relaxation). Right Ventricle: The right ventricular size is normal. Right ventricular systolic function is normal. Left Atrium: Left atrial size was normal in size. Right Atrium: Right atrial size was normal in size. Pericardium: There is no evidence of pericardial effusion. Mitral Valve: The mitral valve is normal in structure. Mild to moderate mitral valve regurgitation. No evidence of mitral valve stenosis. Tricuspid Valve: The tricuspid valve is normal in structure. Tricuspid valve regurgitation is trivial. No evidence of tricuspid stenosis. Aortic Valve: The aortic valve is tricuspid. Aortic valve regurgitation is not visualized. No aortic stenosis is  present. Aortic valve mean gradient measures 3.0 mmHg. Aortic valve peak gradient measures 5.7 mmHg. Pulmonic Valve: The pulmonic valve was normal in structure. Pulmonic valve regurgitation is trivial. No evidence of pulmonic stenosis. Aorta: The aortic root is normal in size and structure. Venous: The inferior vena cava is normal in size with greater than 50% respiratory variability, suggesting right atrial pressure of 3 mmHg. IAS/Shunts: No atrial level shunt detected by color flow Doppler.  LEFT VENTRICLE PLAX 2D LVIDd:         5.50 cm      Diastology LVIDs:         4.50 cm      LV e' medial:    7.40 cm/s LV PW:         1.00 cm      LV E/e' medial:  12.3 LV IVS:        1.00 cm      LV e' lateral:   10.70 cm/s LVOT diam:     2.00 cm      LV E/e' lateral: 8.5 LV SV:         38 LV SV Index:   18 LVOT Area:     3.14 cm  LV Volumes (MOD) LV vol d, MOD A2C: 178.0 ml LV vol d, MOD A4C: 130.0 ml LV vol s, MOD A2C: 92.3 ml LV vol s, MOD A4C: 84.1 ml LV SV MOD A2C:     85.7 ml LV SV MOD A4C:     130.0 ml LV SV MOD  BP:      71.6 ml RIGHT VENTRICLE RV Basal diam:  3.50 cm LEFT ATRIUM             Index       RIGHT ATRIUM           Index LA diam:        4.70 cm 2.28 cm/m  RA Area:     18.50 cm LA Vol (A2C):   66.4 ml 32.16 ml/m RA Volume:   48.10 ml  23.29 ml/m LA Vol (A4C):   63.5 ml 30.75 ml/m LA Biplane Vol: 65.0 ml 31.48 ml/m  AORTIC VALVE AV Area (Vmax):    1.78 cm AV Area (Vmean):   1.69 cm AV Area (VTI):     1.71 cm AV Vmax:           119.00 cm/s AV Vmean:          79.800 cm/s AV VTI:            0.220 m AV Peak Grad:      5.7 mmHg AV Mean Grad:      3.0 mmHg LVOT Vmax:         67.60 cm/s LVOT Vmean:        43.000 cm/s LVOT VTI:          0.120 m LVOT/AV VTI ratio: 0.55  AORTA Ao Root diam: 2.90 cm Ao Asc diam:  3.60 cm MITRAL VALVE MV Area (PHT): 4.80 cm     SHUNTS MV Decel Time: 158 msec     Systemic VTI:  0.12 m MV E velocity: 91.20 cm/s   Systemic Diam: 2.00 cm MV A velocity: 115.00 cm/s MV E/A ratio:  0.79  Kirk Ruths MD Electronically signed by Kirk Ruths MD Signature Date/Time: 10/09/2020/5:43:15 PM    Final     Cardiac Studies   ECHO: 10/09/2020  1. Left ventricular ejection fraction, by estimation, is 25 to 30%. The  left ventricle has severely decreased function. The left ventricle  demonstrates global hypokinesis. The left ventricular internal cavity size was mildly dilated. Left ventricular diastolic parameters are consistent with Grade I diastolic dysfunction (impaired relaxation).   2. Right ventricular systolic function is normal. The right ventricular  size is normal.   3. The mitral valve is normal in structure. Mild to moderate mitral valve regurgitation. No evidence of mitral stenosis.   4. The aortic valve is tricuspid. Aortic valve regurgitation is not  visualized. No aortic stenosis is present.   5. The inferior vena cava is normal in size with greater than 50%  respiratory variability, suggesting right atrial pressure of 3 mmHg.   CARDIAC CATH:10/09/2020   Prox RCA lesion is 100% stenosed.   There is no aortic valve stenosis. 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  Unable to evaluate the left coronary artery due to patient becoming extremely dizzy and extremely agitated.  He demanded that the procedure be stopped; it was also felt to be unsafe to move forward with further coronary angiography at this time.  Mildly elevated left heart and pulmonary artery pressures. Normal right heart filling pressure. Low normal Fick cardiac output/index.   Recommendations: Admit for overnight observation and plan for completion of coronary angiography and possible PCI  tomorrow with assistance of anesthesia. Maintain net even to slightly negative fluid  balance. Escalate goal-directed medical therapy as tolerated. Secondary prevention of coronary artery disease.   Patient Profile     65 y.o. male with history of chronic HFrEF and polysubstance abuse in remission, admitted following acute dizziness during catheterization leading to premature termination of the procedure.  Assessment & Plan    Coronary artery disease: - only RCA visualized yesterday, 100% - pt w/ hx polysubs abuse, pt refused narcotics, but had panic attack during procedure, cath could not be completed - for repeat cath today w/ anesthesia - not currently on heparin - w/ decreased EF but s/p nephrectomy and repeat procedure needed, gently hydrate at 50 cc/hr  - continue ASA and statin - check LFTs  Chronic HFrEF: - PAS 42 at R heart cath w/ low-nl CO/CI - EF 20-25% by echo - volume ok right now - not currently on diuretic - I/O incomplete, need daily wts - if renal function stable after cath >> Entresto  DM: - home rx on hold, on SSI - A1c 8.2 is improved from 9.9  Dizziness: - happened after first dye injection, but was associated w/ anxiety  - based on prev hx, likely 2nd panic attack - continue home rx of Abilify, Cymbalta, Lamictal, plus trazodone and gabapentin  For questions or updates, please contact [facility] Please consult [email or web address] for contact info under      Signed, Rosaria Ferries, PA-C  10/10/2020, 8:28 AM    Personally seen and examined. Agree with APP above with the following comments: Briefly 65 yo M with a history of CAD and HFrEF who presented for repeat LHC with anestheia.  LCX territory PCI with tiacagrelor started.  Based on patient preference, and socioeconomic factors; Plavix 300 mg PO and 75 my transition may be most appropriate. Will add back some GDMT 10/11/20.  Possible DC.  Rudean Haskell, MD [id number], [place] [id number] [phone]  4:55 PM

## 2020-10-10 NOTE — Transfer of Care (Signed)
Immediate Anesthesia Transfer of Care Note  Patient: [name]  Procedure(s) Performed: LEFT HEART CATH AND CORONARY ANGIOGRAPHY CORONARY STENT INTERVENTION  Patient Location: Cath Lab  Anesthesia Type:MAC  Level of Consciousness: awake, oriented and drowsy  Airway & Oxygen Therapy: Patient Spontanous Breathing and Patient connected to face mask oxygen  Post-op Assessment: Report given to RN and Post -op Vital signs reviewed and stable  Post vital signs: Reviewed and stable  Last Vitals:  Vitals Value Taken Time  BP 100/60 10/10/20 1542  Temp    Pulse 76 10/10/20 1546  Resp 24 10/10/20 1546  SpO2 98 % 10/10/20 1546  Vitals shown include unvalidated device data.  Last Pain:  Vitals:   10/10/20 1536  TempSrc:   PainSc: 0-No pain      Patients Stated Pain Goal: 5 (58/09/98 3382)  Complications: No notable events documented.

## 2020-10-10 NOTE — Interval H&P Note (Signed)
Cath Lab Visit (complete for each Cath Lab visit)  Clinical Evaluation Leading to the Procedure:   ACS: Yes.    Non-ACS:    Anginal Classification: CCS IV  Anti-ischemic medical therapy: Minimal Therapy (1 class of medications)  Non-Invasive Test Results: No non-invasive testing performed  Prior CABG: No previous CABG      History and Physical Interval Note:  10/10/2020 1:46 PM  [name]  has presented today for surgery, with the diagnosis of chest  pain.  The various methods of treatment have been discussed with the patient and family. After consideration of risks, benefits and other options for treatment, the patient has consented to  Procedure(s): LEFT HEART CATH AND CORONARY ANGIOGRAPHY (N/A) as a surgical intervention.  The patient's history has been reviewed, patient examined, no change in status, stable for surgery.  I have reviewed the patient's chart and labs.  Questions were answered to the patient's satisfaction.     Larae Grooms

## 2020-10-10 NOTE — Anesthesia Procedure Notes (Signed)
Procedure Name: MAC Date/Time: 10/10/2020 2:00 PM Performed by: Inda Coke, CRNA Pre-anesthesia Checklist: Patient identified, Emergency Drugs available, Suction available, Timeout performed and Patient being monitored Patient Re-evaluated:Patient Re-evaluated prior to induction Oxygen Delivery Method: Simple face mask Induction Type: IV induction Dental Injury: Teeth and Oropharynx as per pre-operative assessment

## 2020-10-10 NOTE — Anesthesia Postprocedure Evaluation (Signed)
Anesthesia Post Note  Patient: [name]  Procedure(s) Performed: LEFT HEART CATH AND CORONARY ANGIOGRAPHY CORONARY STENT INTERVENTION     Patient location during evaluation: PACU Anesthesia Type: MAC Level of consciousness: awake and alert Pain management: pain level controlled Vital Signs Assessment: post-procedure vital signs reviewed and stable Respiratory status: spontaneous breathing and respiratory function stable Cardiovascular status: stable Postop Assessment: no apparent nausea or vomiting Anesthetic complications: no   No notable events documented.  Last Vitals:  Vitals:   10/10/20 1608 10/10/20 1615  BP: 99/65 (!) 103/59  Pulse:  (!) 58  Resp: 20 (!) 0  Temp:    SpO2: 98% 99%    Last Pain:  Vitals:   10/10/20 1536  TempSrc:   PainSc: 0-No pain                 Najmah Carradine DANIEL

## 2020-10-11 DIAGNOSIS — Z955 Presence of coronary angioplasty implant and graft: Secondary | ICD-10-CM

## 2020-10-11 DIAGNOSIS — E119 Type 2 diabetes mellitus without complications: Secondary | ICD-10-CM | POA: Diagnosis not present

## 2020-10-11 DIAGNOSIS — I25110 Atherosclerotic heart disease of native coronary artery with unstable angina pectoris: Secondary | ICD-10-CM | POA: Diagnosis not present

## 2020-10-11 DIAGNOSIS — Z7984 Long term (current) use of oral hypoglycemic drugs: Secondary | ICD-10-CM | POA: Diagnosis not present

## 2020-10-11 DIAGNOSIS — I5020 Unspecified systolic (congestive) heart failure: Secondary | ICD-10-CM | POA: Diagnosis not present

## 2020-10-11 DIAGNOSIS — Z20822 Contact with and (suspected) exposure to covid-19: Secondary | ICD-10-CM | POA: Diagnosis not present

## 2020-10-11 HISTORY — DX: Presence of coronary angioplasty implant and graft: Z95.5

## 2020-10-11 LAB — BASIC METABOLIC PANEL
Anion gap: 10 (ref 5–15)
BUN: 14 mg/dL (ref 8–23)
CO2: 21 mmol/L — ABNORMAL LOW (ref 22–32)
Calcium: 9.2 mg/dL (ref 8.9–10.3)
Chloride: 105 mmol/L (ref 98–111)
Creatinine, Ser: 1.06 mg/dL (ref 0.61–1.24)
GFR, Estimated: >60 mL/min (ref >60)
Glucose, Bld: 224 mg/dL — ABNORMAL HIGH (ref 70–99)
Potassium: 4 mmol/L (ref 3.5–5.1)
Sodium: 136 mmol/L (ref 135–145)

## 2020-10-11 LAB — CBC
HCT: 41.9 % (ref 39.0–52.0)
Hemoglobin: 14 g/dL (ref 13.0–17.0)
MCH: 28.2 pg (ref 26.0–34.0)
MCHC: 33.4 g/dL (ref 30.0–36.0)
MCV: 84.5 fL (ref 80.0–100.0)
Platelets: 172 10*3/uL (ref 150–400)
RBC: 4.96 MIL/uL (ref 4.22–5.81)
RDW: 14.3 % (ref 11.5–15.5)
WBC: 4.9 10*3/uL (ref 4.0–10.5)
nRBC: 0 % (ref 0.0–0.2)

## 2020-10-11 LAB — GLUCOSE, CAPILLARY: Glucose-Capillary: 127 mg/dL — ABNORMAL HIGH (ref 70–99)

## 2020-10-11 MED ORDER — METFORMIN HCL ER 500 MG PO TB24: 500.0000 mg | ORAL_TABLET | ORAL | Status: DC

## 2020-10-11 MED ORDER — CLOPIDOGREL BISULFATE 75 MG PO TABS: 75.0000 mg | ORAL_TABLET | Freq: Every day | ORAL | Status: DC

## 2020-10-11 MED ORDER — METOPROLOL SUCCINATE ER 25 MG PO TB24: 25.0000 mg | ORAL_TABLET | Freq: Every day | ORAL | Status: DC

## 2020-10-11 MED ORDER — CLOPIDOGREL BISULFATE 75 MG PO TABS
300.0000 mg | ORAL_TABLET | Freq: Once | ORAL | Status: AC
  Administered 2020-10-11: 300 mg via ORAL
  Filled 2020-10-11: qty 4

## 2020-10-11 MED ORDER — LOSARTAN POTASSIUM 25 MG PO TABS
12.5000 mg | ORAL_TABLET | Freq: Every day | ORAL | Status: DC
  Administered 2020-10-11: 12.5 mg via ORAL
  Filled 2020-10-11: qty 1

## 2020-10-11 MED ORDER — ACETAMINOPHEN 325 MG PO TABS: 650.0000 mg | ORAL_TABLET | ORAL | Status: DC | PRN

## 2020-10-11 MED ORDER — METOPROLOL SUCCINATE ER 25 MG PO TB24: 25.0000 mg | ORAL_TABLET | Freq: Every day | ORAL | 11 refills | Status: DC

## 2020-10-11 MED ORDER — PANTOPRAZOLE SODIUM 40 MG PO TBEC: 40.0000 mg | DELAYED_RELEASE_TABLET | Freq: Every day | ORAL | 1 refills | Status: DC

## 2020-10-11 MED ORDER — CLOPIDOGREL BISULFATE 75 MG PO TABS: 75.0000 mg | ORAL_TABLET | Freq: Every day | ORAL | 11 refills | Status: DC

## 2020-10-11 NOTE — Discharge Summary (Addendum)
Discharge Summary    Patient ID: [name] MRN: [id number]; DOB: 12-13-1955  Admit date: 10/09/2020 Discharge date: 10/11/2020  PCP:  [place] Cardiologist:  Carney Bern, MD   [facility] Providers Cardiologist:  Jenkins Rouge, MD        Discharge Diagnoses    Principal Problem:   Coronary artery disease Active Problems:   S/P coronary artery stent placement (DES to LCx)   HFrEF (heart failure with reduced ejection fraction) ([place])   HLD (hyperlipidemia)   Essential hypertension   Diabetes type 2, controlled ([place])    Diagnostic Studies/Procedures    LEFT HEART CATH AND CORONARY ANGIOGRAPHY, LEFT HEART CATH AND CORONARY ANGIOGRAPHY 10/10/2020 Narrative   Prox RCA lesion is 100% stenosed.  Left to right collaterals.   Mid LAD lesion is 50% stenosed.   1st Mrg lesion is 75% stenosed.   Mid Cx lesion is 90% stenosed.  .   Mid Cx to Dist Cx lesion is 75% stenosed.   A drug-eluting stent was successfully placed using a STENT ONYX FRONTIER 3.0X26, postdilated to 3.25 mm.   Post intervention, there is a 0% residual stenosis.   Mid Cx to Dist Cx lesion is 75% stenosed   A drug-eluting stent was successfully placed using a STENT ONYX FRONTIER 2.75X12, overlapping the distal edge of the 26 mm stent.   Post intervention, there is a 0% residual stenosis.   LV end diastolic pressure is normal.   There is no aortic valve stenosis.  Will continue Cangrelor IV for an hour after Brilinta is administered.  Could switch to clopidogrel in AM with 300 mg loading dose followed by 75 mg daily.  Brilinta used in cath lab due to its compatibility with Cangrelor.  Advanced atherosclerosis.  Circumflex feeds collaterals to RCA so hopefully, this will help overall LV dysfunction.  Continue aggressive medical therapy for LV dysfunction.    ECHOCARDIOGRAM 10/09/2020  1. Left ventricular ejection fraction, by estimation, is 25 to 30%. The  left ventricle  has severely decreased function. The left ventricle  demonstrates global hypokinesis. The left ventricular internal cavity size  was mildly dilated. Left ventricular  diastolic parameters are consistent with Grade I diastolic dysfunction  (impaired relaxation).   2. Right ventricular systolic function is normal. The right ventricular  size is normal.   3. The mitral valve is normal in structure. Mild to moderate mitral valve  regurgitation. No evidence of mitral stenosis.   4. The aortic valve is tricuspid. Aortic valve regurgitation is not  visualized. No aortic stenosis is present.   5. The inferior vena cava is normal in size with greater than 50%  respiratory variability, suggesting right atrial pressure of 3 mmHg.    RIGHT/LEFT HEART CATH AND CORONARY ANGIOGRAPHY 10/09/2020 Narrative   Prox RCA lesion is 100% stenosed.   There is no aortic valve stenosis.  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  Unable to evaluate the left coronary artery due to patient becoming extremely dizzy and extremely agitated.  He demanded that the procedure be stopped; it was also felt to be unsafe to move forward with further coronary angiography at this time. Mildly elevated left heart and pulmonary artery pressures. Normal right heart filling pressure. Low normal Fick cardiac output/index.  Recommendations: Admit for overnight observation and plan for completion of coronary angiography and possible PCI tomorrow with assistance of anesthesia. Maintain net even to slightly negative fluid balance. Escalate goal-directed medical therapy as tolerated. Secondary  prevention of coronary artery disease.  Nelva Bush, MD [facility]  _____________   History of Present Illness     [name] is a 65 y.o. male with (HFrEF) heart failure with reduced ejection fraction, diabetes mellitus, hypertension, borderline personality disorder, prior polysubstance abuse  (quit in 2013), tobacco smoker, renal cell CA s/p nephrectomy.  He was recently evaluated by Dr. Gardiner Rhyme for congestive heart failure.  He had been admitted in 12/2019 with new onset CHF and required thoracentesis for management of a pleural effusion.  EF was 25-30 on echocardiogram.  GDMT was limited by low BP.  He was to have an ischemic evaluation as an OP.  Post DC, he has followed with the [facility].  He was referred to our service to arrange cardiac catheterization.    Hospital Course     Consultants: none    He presented to [facility] on 10/09/20 for cardiac catheterization.  The procedure demonstrated a chronically occluded RCA.  The L system could not be evaluated due to acute dizziness and agitation.  The procedure was aborted and he was admitted for observation.  An echocardiogram demonstrated continued severe reduction in LVF with EF 25-30.  Therefore, there was concern for significant L system disease as well.  Pt noted prior hx of dizziness with panic attacks.  Through shared decision making, it was decided to proceed with repeat cardiac catheterization the next day with the assistance of deep sedation provided by anesthesia.   Cardiac catheterization and PCI was performed by Dr. Irish Lack with MAC provided by anesthesia.  Cardiac catheterization demonstrated mod LAD and OM disease and severe LCx disease.  LCx provided collateral to the distal RCA.  Pt underwent PCI with DES x 2 to the LCx.  He was given Ticagrelor in the lab post PCI but due to pt preference, it was decided to transition to Clopidogrel.   There were concerns regarding adherence to medications.  It was felt that it is safest to not make too many medication adjustments.  Of note, his LDL is above goal and this can be addressed with his primary cardiologist at the [facility].  Also, further titration of GDMT can be accomplished with his primary cardiologist at the [facility] (MRA, ARB vs ARNI, etc).  His HR was in the 80-90s.   Therefore, his dose of Metoprolol was increased to 25 mg once daily.  He was given a loading dose of Clopidogrel this morning and will remain on Clopidogrel 75 mg once daily.    He was evaluated by Dr. Gasper Sells this morning.  He is doing well without chest pain and is felt to be stable and ready for DC to home.    He will f/u with Cardiology at the [facility] and see [facility] as needed.     Did the patient have an acute coronary syndrome (MI, NSTEMI, STEMI, etc) this admission?:  No                               Did the patient have a percutaneous coronary intervention (stent / angioplasty)?:  Yes.     Cath/PCI Registry Performance & Quality Measures: Aspirin prescribed? - Yes ADP Receptor Inhibitor (Plavix/Clopidogrel, Brilinta/Ticagrelor or Effient/Prasugrel) prescribed (includes medically managed patients)? - Yes High Intensity Statin (Lipitor 40-64m or Crestor 20-47m prescribed? - Yes For EF <40%, was ACEI/ARB prescribed? - No - Reason:  Hx of low BP; will attempt to start at f/u  For EF <40%, Aldosterone Antagonist (Spironolactone or Eplerenone) prescribed? - No - Reason:  hx of low BP; will attempt to start at f/u Cardiac Rehab Phase II ordered? - Yes      _____________  Discharge Vitals Blood pressure (!) 143/84, pulse 99, temperature 98.2 F (36.8 C), temperature source Oral, resp. rate 18, height _0  (1.778 m), weight 88.5 kg, SpO2 97 %.  Filed Weights   10/09/20 0934 10/10/20 1059  Weight: 88.5 kg 88.5 kg    Labs & Radiologic Studies    CBC Recent Labs    10/10/20 0158 10/11/20 0342  WBC 5.2 4.9  HGB 14.6 14.0  HCT 43.0 41.9  MCV 84.3 84.5  PLT 173 539   Basic Metabolic Panel Recent Labs    10/10/20 0158 10/11/20 0342  NA 136 136  K 4.1 4.0  CL 105 105  CO2 24 21*  GLUCOSE 132* 224*  BUN 13 14  CREATININE 0.93 1.06  CALCIUM 9.2 9.2   Liver Function Tests Recent Labs    10/10/20 0158  AST 21  ALT 19  ALKPHOS 52  BILITOT  0.8  PROT 6.0*  ALBUMIN 3.3*   Hemoglobin A1C Recent Labs    10/10/20 0158  HGBA1C 8.2*   Fasting Lipid Panel Recent Labs    10/10/20 0158  CHOL 135  HDL 34*  LDLCALC 82  TRIG 95  CHOLHDL 4.0   _____________   Disposition   Pt is being discharged home today in good condition.  Follow-up Plans & Appointments     Follow-up Information     [facility], MD. Schedule an appointment as soon as possible for a visit in 2 week(s).   Why: Please call to make an appt with Dr. Raynelle Dick for 2 weeks from now for follow up. Contact information: [facility] [address] [place], [place] [id number] [phone]               Discharge Instructions     (HEART FAILURE PATIENTS) Call MD:  Anytime you have any of the following symptoms: 1) 3 pound weight gain in 24 hours or 5 pounds in 1 week 2) shortness of breath, with or without a dry hacking cough 3) swelling in the hands, feet or stomach 4) if you have to sleep on extra pillows at night in order to breathe.   Complete by: As directed    AMB Referral to Cardiac Rehabilitation - Phase II   Complete by: As directed    Diagnosis: Coronary Stents   After initial evaluation and assessments completed: Virtual Based Care may be provided alone or in conjunction with Phase 2 Cardiac Rehab based on patient barriers.: Yes   Amb Referral to Cardiac Rehabilitation   Complete by: As directed    EF 25-30% Patient is [facility]   Diagnosis:  Heart Failure (see criteria below if ordering Phase II) Coronary Stents     Heart Failure Type: Chronic Systolic   After initial evaluation and assessments completed: Virtual Based Care may be provided alone or in conjunction with Phase 2 Cardiac Rehab based on patient barriers.: Yes   Diet - low sodium heart healthy   Complete by: As directed    Diet Carb Modified   Complete by: As directed    Discharge wound care:   Complete by: As directed    Call for any  swelling, bleeding, bruising or fever.   Driving Restrictions   Complete by: As directed    No driving  for 2 days   Increase activity slowly   Complete by: As directed    Lifting restrictions   Complete by: As directed    No lifting over 5 lbs for 1 week   Sexual Activity Restrictions   Complete by: As directed    None for 1 week       Discharge Medications   Allergies as of 10/11/2020       Reactions   Morphine And Related Other (See Comments)   Pt unable to take narcotics.         Medication List     STOP taking these medications    atorvastatin 40 MG tablet Commonly known as: LIPITOR   ibuprofen 800 MG tablet Commonly known as: ADVIL   meloxicam 7.5 MG tablet Commonly known as: Mobic   omeprazole 20 MG capsule Commonly known as: PRILOSEC Replaced by: pantoprazole 40 MG tablet       TAKE these medications    acetaminophen 325 MG tablet Commonly known as: TYLENOL Take 2 tablets (650 mg total) by mouth every 4 (four) hours as needed for headache or mild pain.   ARIPiprazole 10 MG tablet Commonly known as: ABILIFY Take 5 mg by mouth daily.   aspirin EC 81 MG tablet Take 81 mg by mouth daily. Swallow whole.   clopidogrel 75 MG tablet Commonly known as: PLAVIX Take 1 tablet (75 mg total) by mouth daily. Start taking on: October 12, 2020   DULoxetine 20 MG capsule Commonly known as: CYMBALTA Take 20 mg by mouth daily.   empagliflozin 25 MG Tabs tablet Commonly known as: JARDIANCE Take 12.5 mg by mouth daily.   furosemide 20 MG tablet Commonly known as: LASIX Take 20 mg by mouth daily as needed for fluid.   gabapentin 600 MG tablet Commonly known as: NEURONTIN Take 600 mg by mouth daily.   insulin glargine 100 UNIT/ML injection Commonly known as: LANTUS Inject 0.4 mLs (40 Units total) into the skin at bedtime. What changed: how much to take   lamoTRIgine 200 MG tablet Commonly known as: LAMICTAL Take 100 mg by mouth 2 (two) times  daily.   lidocaine 5 % Commonly known as: LIDODERM Place 1 patch onto the skin daily. Remove & Discard patch within 12 hours or as directed by MD apply 1 patch on each knee   metFORMIN 500 MG 24 hr tablet Commonly known as: GLUCOPHAGE-XR Take 1-2 tablets (500-1,000 mg total) by mouth See admin instructions. Take 1 tablet by mouth every morning, and take 2 tablets every evening Start taking on: October 13, 2020 What changed: These instructions start on October 13, 2020. If you are unsure what to do until then, ask your doctor or other care provider.   metoprolol succinate 25 MG 24 hr tablet Commonly known as: TOPROL-XL Take 1 tablet (25 mg total) by mouth daily. Start taking on: October 12, 2020 What changed: how much to take   multivitamin with minerals Tabs tablet Take 1 tablet by mouth daily.   nicotine polacrilex 2 MG lozenge Commonly known as: COMMIT Take 2 mg by mouth every 2 (two) hours as needed for smoking cessation.   pantoprazole 40 MG tablet Commonly known as: PROTONIX Take 1 tablet (40 mg total) by mouth daily. Start taking on: October 12, 2020 Replaces: omeprazole 20 MG capsule   rosuvastatin 40 MG tablet Commonly known as: CRESTOR Take 40 mg by mouth daily.   traZODone 50 MG tablet Commonly known as: DESYREL Take 50 mg by  mouth at bedtime as needed for sleep.               Discharge Care Instructions  (From admission, onward)           Start     Ordered   10/11/20 0000  Discharge wound care:       Comments: Call for any swelling, bleeding, bruising or fever.   10/11/20 1031               Outstanding Labs/Studies   None   Duration of Discharge Encounter   Greater than 30 minutes including physician time.  Signed, Richardson Dopp, PA-C 10/11/2020, 10:46 AM  Personally seen and examined. Agree with APP above. See concomitant progress note for more details.

## 2020-10-11 NOTE — Progress Notes (Signed)
Progress Note  Patient Name: [name] Date of Encounter: 10/11/2020  Primary Cardiologist: Jenkins Rouge, MD   Subjective   No events post cath. Patient feels great- he is ready to go.  He wants to leave by noon.  Inpatient Medications    Scheduled Meds:  ARIPiprazole  5 mg Oral Daily   aspirin  81 mg Oral Daily   [START ON 10/12/2020] clopidogrel  75 mg Oral Daily   DULoxetine  20 mg Oral Daily   empagliflozin  10 mg Oral Daily   gabapentin  600 mg Oral Daily   insulin aspart  0-15 Units Subcutaneous TID WC   insulin glargine-yfgn  25 Units Subcutaneous QHS   lamoTRIgine  100 mg Oral BID   metoprolol succinate  12.5 mg Oral Daily   multivitamin with minerals  1 tablet Oral Daily   pantoprazole  40 mg Oral Daily   rosuvastatin  40 mg Oral Daily   sodium chloride flush  3 mL Intravenous Q12H   sodium chloride flush  3 mL Intravenous Q12H   Continuous Infusions:  sodium chloride     sodium chloride     PRN Meds: sodium chloride, sodium chloride, acetaminophen, bisacodyl, ondansetron (ZOFRAN) IV, polyethylene glycol, sodium chloride flush, traZODone   Vital Signs    Vitals:   10/10/20 2311 10/11/20 0338 10/11/20 0741 10/11/20 0806  BP: 126/69 (!) 141/88 136/83 (!) 143/84  Pulse: 91 87 91 99  Resp: '19 18 18   '$ Temp: 98 F (36.7 C) 98.1 F (36.7 C) 98.2 F (36.8 C)   TempSrc: Oral Oral Oral   SpO2: 97% 99% 97%   Weight:      Height:        Intake/Output Summary (Last 24 hours) at 10/11/2020 0901 Last data filed at 10/10/2020 1903 Gross per 24 hour  Intake 1322.52 ml  Output 20 ml  Net 1302.52 ml   Filed Weights   10/09/20 0934 10/10/20 1059  Weight: 88.5 kg 88.5 kg    Telemetry    SR PACs and PVCs - Personally Reviewed  ECG    SR 82 LHV - Personally Reviewed  Physical Exam   GEN: No acute distress.   Neck: No JVD Cardiac: RRR, no murmurs, rubs, or gallops.  R radial site c/d/i Respiratory: Clear to auscultation bilaterally. GI: Soft,  nontender, non-distended  MS: No edema; No deformity. Neuro:  Nonfocal  Psych: Normal affect   Labs    Chemistry Recent Labs  Lab 10/07/20 1042 10/09/20 1056 10/09/20 1059 10/10/20 0158 10/11/20 0342  NA 139   < > 142 136 136  K 4.1   < > 3.8 4.1 4.0  CL 102  --   --  105 105  CO2 24  --   --  24 21*  GLUCOSE 193*  --   --  132* 224*  BUN 11  --   --  13 14  CREATININE 0.95  --   --  0.93 1.06  CALCIUM 9.7  --   --  9.2 9.2  PROT  --   --   --  6.0*  --   ALBUMIN  --   --   --  3.3*  --   AST  --   --   --  21  --   ALT  --   --   --  19  --   ALKPHOS  --   --   --  52  --   BILITOT  --   --   --  0.8  --   GFRNONAA  --   --   --  >60 >60  ANIONGAP  --   --   --  7 10   < > = values in this interval not displayed.     Hematology Recent Labs  Lab 10/07/20 1042 10/09/20 1056 10/09/20 1059 10/10/20 0158 10/11/20 0342  WBC 5.0  --   --  5.2 4.9  RBC 5.18  --   --  5.10 4.96  HGB 14.5   < > 13.9 14.6 14.0  HCT 44.3   < > 41.0 43.0 41.9  MCV 86  --   --  84.3 84.5  MCH 28.0  --   --  28.6 28.2  MCHC 32.7  --   --  34.0 33.4  RDW 14.6  --   --  14.3 14.3  PLT 194  --   --  173 172   < > = values in this interval not displayed.    Cardiac EnzymesNo results for input(s): TROPONINI in the last 168 hours. No results for input(s): TROPIPOC in the last 168 hours.   BNPNo results for input(s): BNP, PROBNP in the last 168 hours.   DDimer No results for input(s): DDIMER in the last 168 hours.   Radiology    CARDIAC CATHETERIZATION  Result Date: 10/10/2020   Prox RCA lesion is 100% stenosed.  Left to right collaterals.   Mid LAD lesion is 50% stenosed.   1st Mrg lesion is 75% stenosed.   Mid Cx lesion is 90% stenosed.  .   Mid Cx to Dist Cx lesion is 75% stenosed.   A drug-eluting stent was successfully placed using a STENT ONYX FRONTIER 3.0X26, postdilated to 3.25 mm.   Post intervention, there is a 0% residual stenosis.   Mid Cx to Dist Cx lesion is 75% stenosed   A  drug-eluting stent was successfully placed using a STENT ONYX FRONTIER 2.75X12, overlapping the distal edge of the 26 mm stent.   Post intervention, there is a 0% residual stenosis.   LV end diastolic pressure is normal.   There is no aortic valve stenosis. Will continue Cangrelor IV for an hour after Brilinta is administered. Could switch to clopidogrel in AM with 300 mg loading dose followed by 75 mg daily.  Brilinta used in cath lab due to its compatibility with Cangrelor. Advanced atherosclerosis.  Circumflex feeds collaterals to RCA so hopefully, this will help overall LV dysfunction.  Continue aggressive medical therapy for LV dysfunction.    CARDIAC CATHETERIZATION  Result Date: 10/09/2020   Prox RCA lesion is 100% stenosed.   There is no aortic valve stenosis. 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  Unable to evaluate the left coronary artery due to patient becoming extremely dizzy and extremely agitated.  He demanded that the procedure be stopped; it was also felt to be unsafe to move forward with further coronary angiography at this time. Mildly elevated left heart and pulmonary artery pressures. Normal right heart filling pressure. Low normal Fick cardiac output/index. Recommendations: Admit for overnight observation and plan for completion of coronary angiography and possible PCI tomorrow with assistance of anesthesia. Maintain net even to slightly negative fluid balance. Escalate goal-directed medical therapy as tolerated. Secondary prevention of coronary artery disease. Nelva Bush, MD [facility]  ECHOCARDIOGRAM COMPLETE  Result Date: 10/09/2020    ECHOCARDIOGRAM REPORT   Patient Name:   [name] Date of Exam: 10/09/2020 Medical Rec #:  [id number]        Height:       70.0 in Accession #:    [id number]       Weight:       195.0 lb Date of Birth:  1955-10-09        BSA:          2.065 m Patient Age:    65 years         BP:           136/80 mmHg  Patient Gender: M                HR:           97 bpm. Exam Location:  Inpatient Procedure: 2D Echo, Cardiac Doppler and Color Doppler Indications:    Congestive Heart Failure I50.9  History:        Patient has prior history of Echocardiogram examinations, most                 recent 01/05/2020. Risk Factors:Diabetes.  Sonographer:    Merrie Roof RDCS Referring Phys: [place]  1. Left ventricular ejection fraction, by estimation, is 25 to 30%. The left ventricle has severely decreased function. The left ventricle demonstrates global hypokinesis. The left ventricular internal cavity size was mildly dilated. Left ventricular diastolic parameters are consistent with Grade I diastolic dysfunction (impaired relaxation).  2. Right ventricular systolic function is normal. The right ventricular size is normal.  3. The mitral valve is normal in structure. Mild to moderate mitral valve regurgitation. No evidence of mitral stenosis.  4. The aortic valve is tricuspid. Aortic valve regurgitation is not visualized. No aortic stenosis is present.  5. The inferior vena cava is normal in size with greater than 50% respiratory variability, suggesting right atrial pressure of 3 mmHg. FINDINGS  Left Ventricle: Left ventricular ejection fraction, by estimation, is 25 to 30%. The left ventricle has severely decreased function. The left ventricle demonstrates global hypokinesis. The left ventricular internal cavity size was mildly dilated. There is no left ventricular hypertrophy. Left ventricular diastolic parameters are consistent with Grade I diastolic dysfunction (impaired relaxation). Right Ventricle: The right ventricular size is normal. Right ventricular systolic function is normal. Left Atrium: Left atrial size was normal in size. Right Atrium: Right atrial size was normal in size. Pericardium: There is no evidence of pericardial effusion. Mitral Valve: The mitral valve is normal in structure. Mild to  moderate mitral valve regurgitation. No evidence of mitral valve stenosis. Tricuspid Valve: The tricuspid valve is normal in structure. Tricuspid valve regurgitation is trivial. No evidence of tricuspid stenosis. Aortic Valve: The aortic valve is tricuspid. Aortic valve regurgitation is not visualized. No aortic stenosis is present. Aortic valve mean gradient measures 3.0 mmHg. Aortic valve peak gradient measures 5.7 mmHg. Pulmonic Valve: The pulmonic valve was normal in structure. Pulmonic valve regurgitation is trivial. No evidence of pulmonic stenosis. Aorta: The aortic root is normal in size and structure. Venous: The inferior vena cava is normal in size with greater than 50% respiratory variability, suggesting right atrial pressure of 3 mmHg. IAS/Shunts: No atrial level shunt detected by color flow Doppler.  LEFT VENTRICLE PLAX 2D LVIDd:         5.50 cm      Diastology LVIDs:         4.50 cm      LV e' medial:    7.40 cm/s LV PW:  1.00 cm      LV E/e' medial:  12.3 LV IVS:        1.00 cm      LV e' lateral:   10.70 cm/s LVOT diam:     2.00 cm      LV E/e' lateral: 8.5 LV SV:         38 LV SV Index:   18 LVOT Area:     3.14 cm  LV Volumes (MOD) LV vol d, MOD A2C: 178.0 ml LV vol d, MOD A4C: 130.0 ml LV vol s, MOD A2C: 92.3 ml LV vol s, MOD A4C: 84.1 ml LV SV MOD A2C:     85.7 ml LV SV MOD A4C:     130.0 ml LV SV MOD BP:      71.6 ml RIGHT VENTRICLE RV Basal diam:  3.50 cm LEFT ATRIUM             Index       RIGHT ATRIUM           Index LA diam:        4.70 cm 2.28 cm/m  RA Area:     18.50 cm LA Vol (A2C):   66.4 ml 32.16 ml/m RA Volume:   48.10 ml  23.29 ml/m LA Vol (A4C):   63.5 ml 30.75 ml/m LA Biplane Vol: 65.0 ml 31.48 ml/m  AORTIC VALVE AV Area (Vmax):    1.78 cm AV Area (Vmean):   1.69 cm AV Area (VTI):     1.71 cm AV Vmax:           119.00 cm/s AV Vmean:          79.800 cm/s AV VTI:            0.220 m AV Peak Grad:      5.7 mmHg AV Mean Grad:      3.0 mmHg LVOT Vmax:         67.60 cm/s  LVOT Vmean:        43.000 cm/s LVOT VTI:          0.120 m LVOT/AV VTI ratio: 0.55  AORTA Ao Root diam: 2.90 cm Ao Asc diam:  3.60 cm MITRAL VALVE MV Area (PHT): 4.80 cm     SHUNTS MV Decel Time: 158 msec     Systemic VTI:  0.12 m MV E velocity: 91.20 cm/s   Systemic Diam: 2.00 cm MV A velocity: 115.00 cm/s MV E/A ratio:  0.79 Kirk Ruths MD Electronically signed by Kirk Ruths MD Signature Date/Time: 10/09/2020/5:43:15 PM    Final     Cardiac Studies   Anesthesia/LHC    Prox RCA lesion is 100% stenosed.  Left to right collaterals.   Mid LAD lesion is 50% stenosed.   1st Mrg lesion is 75% stenosed.   Mid Cx lesion is 90% stenosed.  .   Mid Cx to Dist Cx lesion is 75% stenosed.   A drug-eluting stent was successfully placed using a STENT ONYX FRONTIER 3.0X26, postdilated to 3.25 mm.   Post intervention, there is a 0% residual stenosis.   Mid Cx to Dist Cx lesion is 75% stenosed   A drug-eluting stent was successfully placed using a STENT ONYX FRONTIER 2.75X12, overlapping the distal edge of the 26 mm stent.   Post intervention, there is a 0% residual stenosis.   LV end diastolic pressure is normal.   There is no aortic valve stenosis.   Will continue Cangrelor IV for  an hour after Brilinta is administered.    Could switch to clopidogrel in AM with 300 mg loading dose followed by 75 mg daily.  Brilinta used in cath lab due to its compatibility with Cangrelor.    Advanced atherosclerosis.  Circumflex feeds collaterals to RCA so hopefully, this will help overall LV dysfunction.  Continue aggressive medical therapy for LV dysfunction.    Patient Profile     65 y.o. male with history of chronic HFrEF and polysubstance abuse in remission, admitted following acute dizziness during catheterization leading to premature termination of the procedure.  Presented 8/26 for anesthesia LHC with LCx PCI  Assessment & Plan    CAD - ASA and plavix per Cath note (compliance concerns), loading today  with plavix and 75 mg PO Daily  - continue statin  Chronic HFrEF - NYHA class I, Stage C, euvolemic, etiology from polysubstance abuse vs CAD on ddx - received 110 cc contrast; conservative uptitation of GDMT during this admission - increasing home metoprolol to 25 mg PO Daily - continue jardiance 10 mg PO Daily - outpatient MRA initiation and ARB initiation; I do have worries long term about compliance - has prn lasix for fluid overload  Pain - no change in his chronic medication; outpatient transition off of meloxicam advised  For questions or updates, please contact [facility] Please consult [email or web address] for contact info under Cardiology/STEMI.      Signed, Werner Lean, MD  10/11/2020, 9:01 AM

## 2020-10-11 NOTE — Progress Notes (Signed)
CARDIAC REHAB PHASE I   Patient refused ambulation stating he has been walking around in his room. Patient to be discharged home today. Connected to [place]. Cardiac Rehab discharge education completed including risk factors for CAD, exercise guidelines, CHF, restrictions, heart healthy, low sodium, dm diet, smoking cessation, and activity progression. Referral placed to [facility] for phase 2 cardiac rehab however authorization will have to be obtained from the [place]. Patient known to ambulate for transportation. He is encouraged to follow activity progression guidelines when he can and understands the risk of overexertion in his acute phase of recovery.   Ladelle Teodoro Minus Breeding RN, BSN

## 2020-10-11 NOTE — Discharge Instructions (Addendum)
DO NOT miss a dose of Plavix (Clopidogrel).  Make sure you pick up the prescription from the pharmacy.  DO NOT take Prilosec (Omeprazole).  It can affect how Plavix works.  I sent a prescription to your pharmacy for Protonix (Pantoprazole) to take its place.  If you would rather, you can get Pepcid (Famotidine) 20 mg twice daily over the counter instead of Protonix.  DO NOT take Metformin until Monday October 13, 2020.  I have sent prescriptions for Plavix, Protonix and Metoprolol to [facility] on [address] and [address].  Please pick them up today.  You can ask your provider at the [place] to change these prescriptions over to the [facility] at your follow up.  Ibuprofen and Meloxicam can be problematic when you have coronary artery disease and a stent in your heart.  It is best to try to avoid or limit how you take them.  Please try to take Tylenol instead.  If your pain is severe, you can take the ibuprofen or meloxicam as needed.  Please discuss this further with your cardiologist at the [facility] when you see her for follow up.

## 2020-10-12 ENCOUNTER — Encounter (HOSPITAL_COMMUNITY): Payer: Self-pay | Admitting: Interventional Cardiology

## 2020-10-13 ENCOUNTER — Telehealth: Payer: Self-pay | Admitting: *Deleted

## 2020-10-13 LAB — GLUCOSE, CAPILLARY: Glucose-Capillary: 76 mg/dL (ref 70–99)

## 2020-10-13 NOTE — Telephone Encounter (Signed)
Transition Care Management Follow-up Telephone Call Date of discharge and from where: 10/11/2020 - [facility]  How have you been since you were released from the hospital? "I am alright" Any questions or concerns? No  Items Reviewed: Did the pt receive and understand the discharge instructions provided? Yes  Medications obtained and verified? Yes  Other? No  Any new allergies since your discharge? No  Dietary orders reviewed? No Do you have support at home? Yes   Functional Questionnaire: (I = Independent and D = Dependent) ADLs: I  Bathing/Dressing- I  Meal Prep- I  Eating- I  Maintaining continence- I  Transferring/Ambulation- I  Managing Meds- I  Follow up appointments reviewed:  PCP Hospital f/u appt confirmed? No   Specialist Hospital f/u appt confirmed? No   Are transportation arrangements needed? No  If their condition worsens, is the pt aware to call PCP or go to the Emergency Dept.? Yes Was the patient provided with contact information for the PCP's office or ED? Yes Was to pt encouraged to call back with questions or concerns? Yes

## 2020-10-15 ENCOUNTER — Telehealth (HOSPITAL_COMMUNITY): Payer: Self-pay

## 2020-10-15 ENCOUNTER — Encounter (HOSPITAL_COMMUNITY): Payer: Self-pay

## 2020-10-15 NOTE — Telephone Encounter (Signed)
Called pt to see if he was interested in the cardiac rehab program. Pt stated that he is interested but he has to get knee replacements in both knees. Pt stated that he will reach back out to [place] later on. Pt [facility] auth will be expired by the time pt is ready to start. Will mail pt a cardiac rehab brochure and closed his referral until he is ready.

## 2020-10-15 NOTE — Telephone Encounter (Signed)
Called the [place] and asked for a new referral for cardiac rehab, I advised the lady that it was an authorization already in the pt chart that we can use but the dates were out of range from when the pt will start the program. The lady stated that the authorization was good until Feb 2023. I advised her I didn't see that in my records and if she could fax that paperwork to [place] I gave her the fax number and will wait for the new auth dates to come in.

## 2020-10-21 ENCOUNTER — Encounter (HOSPITAL_COMMUNITY): Payer: Self-pay | Admitting: Emergency Medicine

## 2020-10-21 ENCOUNTER — Other Ambulatory Visit: Payer: Self-pay

## 2020-10-21 ENCOUNTER — Emergency Department (HOSPITAL_COMMUNITY)
Admission: EM | Admit: 2020-10-21 | Discharge: 2020-10-21 | Disposition: A | Discharge status: Home / Self Care | Payer: No Typology Code available for payment source | Attending: Student | Admitting: Student

## 2020-10-21 DIAGNOSIS — I2510 Atherosclerotic heart disease of native coronary artery without angina pectoris: Secondary | ICD-10-CM | POA: Insufficient documentation

## 2020-10-21 DIAGNOSIS — Z859 Personal history of malignant neoplasm, unspecified: Secondary | ICD-10-CM | POA: Insufficient documentation

## 2020-10-21 DIAGNOSIS — Z7902 Long term (current) use of antithrombotics/antiplatelets: Secondary | ICD-10-CM | POA: Insufficient documentation

## 2020-10-21 DIAGNOSIS — Z7984 Long term (current) use of oral hypoglycemic drugs: Secondary | ICD-10-CM | POA: Insufficient documentation

## 2020-10-21 DIAGNOSIS — Z79899 Other long term (current) drug therapy: Secondary | ICD-10-CM | POA: Diagnosis not present

## 2020-10-21 DIAGNOSIS — Z7982 Long term (current) use of aspirin: Secondary | ICD-10-CM | POA: Diagnosis not present

## 2020-10-21 DIAGNOSIS — E1169 Type 2 diabetes mellitus with other specified complication: Secondary | ICD-10-CM | POA: Diagnosis not present

## 2020-10-21 DIAGNOSIS — W260XXA Contact with knife, initial encounter: Secondary | ICD-10-CM | POA: Diagnosis not present

## 2020-10-21 DIAGNOSIS — F17210 Nicotine dependence, cigarettes, uncomplicated: Secondary | ICD-10-CM | POA: Diagnosis not present

## 2020-10-21 DIAGNOSIS — Z23 Encounter for immunization: Secondary | ICD-10-CM | POA: Diagnosis not present

## 2020-10-21 DIAGNOSIS — I119 Hypertensive heart disease without heart failure: Secondary | ICD-10-CM | POA: Insufficient documentation

## 2020-10-21 DIAGNOSIS — S6992XA Unspecified injury of left wrist, hand and finger(s), initial encounter: Secondary | ICD-10-CM | POA: Diagnosis present

## 2020-10-21 DIAGNOSIS — S61012A Laceration without foreign body of left thumb without damage to nail, initial encounter: Secondary | ICD-10-CM

## 2020-10-21 MED ORDER — LIDOCAINE HCL (PF) 1 % IJ SOLN
30.0000 mL | Freq: Once | INTRAMUSCULAR | Status: AC
  Administered 2020-10-21: 30 mL
  Filled 2020-10-21: qty 30

## 2020-10-21 MED ORDER — BACITRACIN ZINC 500 UNIT/GM EX OINT: TOPICAL_OINTMENT | Freq: Two times a day (BID) | CUTANEOUS | Status: DC

## 2020-10-21 MED ORDER — TETANUS-DIPHTH-ACELL PERTUSSIS 5-2.5-18.5 LF-MCG/0.5 IM SUSY
0.5000 mL | PREFILLED_SYRINGE | Freq: Once | INTRAMUSCULAR | Status: AC
  Administered 2020-10-21: 0.5 mL via INTRAMUSCULAR
  Filled 2020-10-21: qty 0.5

## 2020-10-21 MED ORDER — LIDOCAINE-EPINEPHRINE-TETRACAINE (LET) TOPICAL GEL
3.0000 mL | Freq: Once | TOPICAL | Status: AC
  Administered 2020-10-21: 3 mL via TOPICAL
  Filled 2020-10-21: qty 3

## 2020-10-21 NOTE — Discharge Instructions (Addendum)
Please return in 10 days for suture removal.  As we discussed please monitor for signs of infection including, swelling, erythema, new purulent drainage.

## 2020-10-21 NOTE — ED Triage Notes (Signed)
Patient BIB [facility] after accidentally cutting his left thumb with a knife this morning while preparing breakfast. Hemorrhage controlled. History of diabetes.

## 2020-10-21 NOTE — ED Provider Notes (Signed)
Was not [facility] EMERGENCY DEPARTMENT Provider Note   CSN: [id number] Arrival date & time: 10/21/20  0710     History Chief Complaint  Patient presents with  . Finger Injury    [name] is a 65 y.o. male with past medical history significant for diabetes presents after cutting his thumb with a knife this morning while preparing breakfast.  Patient reports the knife was clean at the time, and he had just bought it so it was extremely sharp.  Reports there was a lot of bleeding initially, however this was controlled in the ambulance with pressure and bandaging.  Patient reports minimal numbness around the cut, with no numbness or tingling distal or proximal to the injury.  Patient does not know when his last tetanus shot was.  HPI     Past Medical History:  Diagnosis Date  . Anxiety   . Arthritis   . Cancer ([name])   . Cataract   . Depression   . Diabetes mellitus   . GERD (gastroesophageal reflux disease)   . H/O suicide attempt 1997 and 2011   jumped out of moving vehicle and attempted to jump into moving traffic  . High cholesterol   . Hypertension   . Neuropathic pain   . Osteoporosis     Patient Active Problem List   Diagnosis Date Noted  . S/P coronary artery stent placement (DES to LCx) 10/11/2020  . Pleural effusion due to congestive heart failure ([place]) 01/11/2020  . Aortic atherosclerosis ([place]) 01/11/2020  . Coronary artery disease 01/11/2020  . 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  . Orthostatic hypotension 01/10/2020  . HFrEF (heart failure with reduced ejection fraction) ([place]) 01/06/2020  . Tobacco use disorder 01/06/2020  . Obesity 01/06/2020  . Bilateral lower extremity edema 01/04/2020  . Shortness of breath 01/04/2020  . MDD (major depressive disorder), recurrent severe, without psychosis ([place]) 10/09/2016  . Diabetes type 2, controlled ([place]) 01/01/2015  . Erectile dysfunction 01/01/2015  . Healthcare maintenance 01/01/2015  . Bipolar 1  disorder, depressed ([place]) 07/23/2013  . Bipolar disorder ([place]) 04/03/2013  . MDD (major depressive disorder), recurrent, severe, with psychosis ([place]) 11/04/2012  . Suicidal ideation 11/04/2012  . Post traumatic stress disorder (PTSD) 11/04/2012  . CARCINOMA, RENAL CELL 06/05/2008  . HLD (hyperlipidemia) 06/05/2008  . Essential hypertension 06/05/2008  . CHEST PAIN 06/05/2008    Past Surgical History:  Procedure Laterality Date  . COLONOSCOPY    . CORONARY STENT INTERVENTION N/A 10/10/2020   Procedure: CORONARY STENT INTERVENTION;  Surgeon: Jettie Booze, MD;  Location: [facility];  Service: Cardiovascular;  Laterality: N/A;  . LEFT HEART CATH AND CORONARY ANGIOGRAPHY N/A 10/10/2020   Procedure: LEFT HEART CATH AND CORONARY ANGIOGRAPHY;  Surgeon: Jettie Booze, MD;  Location: [facility];  Service: Cardiovascular;  Laterality: N/A;  . NEPHRECTOMY     due to cancer  . RIGHT/LEFT HEART CATH AND CORONARY ANGIOGRAPHY N/A 10/09/2020   Procedure: RIGHT/LEFT HEART CATH AND CORONARY ANGIOGRAPHY;  Surgeon: Nelva Bush, MD;  Location: [facility];  Service: Cardiovascular;  Laterality: N/A;       Family History  Problem Relation Age of Onset  . Alcohol abuse Sister   . Alcohol abuse Brother   . Colon cancer Neg Hx   . Esophageal cancer Neg Hx   . Pancreatic cancer Neg Hx   . Rectal cancer Neg Hx   . Stomach cancer Neg Hx     Social History  Tobacco Use  . Smoking status: Every Day    Packs/day: 0.50    Years: 20.00    Pack years: 10.00    Types: Cigarettes  . Smokeless tobacco: Never  Vaping Use  . Vaping Use: Never used  Substance Use Topics  . Alcohol use: No    Alcohol/week: 3.0 standard drinks    Types: 3 Cans of beer per week    Comment: Pt has been clean for 8 months  . Drug use: No    Home Medications Prior to Admission medications   Medication Sig Start Date End Date Taking? Authorizing Provider  acetaminophen (TYLENOL)  325 MG tablet Take 2 tablets (650 mg total) by mouth every 4 (four) hours as needed for headache or mild pain. 10/11/20   Richardson Dopp T, PA-C  ARIPiprazole (ABILIFY) 10 MG tablet Take 5 mg by mouth daily.    [provider]  aspirin EC 81 MG tablet Take 81 mg by mouth daily. Swallow whole.    [provider]  clopidogrel (PLAVIX) 75 MG tablet Take 1 tablet (75 mg total) by mouth daily. 10/12/20 10/12/21  Richardson Dopp T, PA-C  DULoxetine (CYMBALTA) 20 MG capsule Take 20 mg by mouth daily.    [provider]  empagliflozin (JARDIANCE) 25 MG TABS tablet Take 12.5 mg by mouth daily.    [provider]  furosemide (LASIX) 20 MG tablet Take 20 mg by mouth daily as needed for fluid.    [provider]  gabapentin (NEURONTIN) 600 MG tablet Take 600 mg by mouth daily.    [provider]  insulin glargine (LANTUS) 100 UNIT/ML injection Inject 0.4 mLs (40 Units total) into the skin at bedtime. Patient taking differently: Inject 25 Units into the skin at bedtime. 01/25/20   [name], MD  lamoTRIgine (LAMICTAL) 200 MG tablet Take 100 mg by mouth 2 (two) times daily.    [provider]  lidocaine (LIDODERM) 5 % Place 1 patch onto the skin daily. Remove & Discard patch within 12 hours or as directed by MD apply 1 patch on each knee    [provider]  metFORMIN (GLUCOPHAGE-XR) 500 MG 24 hr tablet Take 1-2 tablets (500-1,000 mg total) by mouth See admin instructions. Take 1 tablet by mouth every morning, and take 2 tablets every evening 10/13/20   Richardson Dopp T, PA-C  metoprolol succinate (TOPROL-XL) 25 MG 24 hr tablet Take 1 tablet (25 mg total) by mouth daily. 10/12/20 10/12/21  Richardson Dopp T, PA-C  Multiple Vitamin (MULTIVITAMIN WITH MINERALS) TABS tablet Take 1 tablet by mouth daily.    [provider]  nicotine polacrilex (COMMIT) 2 MG lozenge Take 2 mg by mouth every 2 (two) hours as needed for smoking cessation.     [provider]  pantoprazole (PROTONIX) 40 MG tablet Take 1 tablet (40 mg total) by mouth daily. 10/12/20 12/11/20  Richardson Dopp T, PA-C  rosuvastatin (CRESTOR) 40 MG tablet Take 40 mg by mouth daily.    [provider]  traZODone (DESYREL) 50 MG tablet Take 50 mg by mouth at bedtime as needed for sleep.    [provider]    Allergies    Morphine and related  Review of Systems   Review of Systems  Skin:  Positive for wound.  All other systems reviewed and are negative.  Physical Exam Updated Vital Signs BP 115/71 (BP Location: Left Arm)   Pulse 100   Temp 98.6 F (37 C) (Oral)  Resp 16   SpO2 100%   Physical Exam Vitals and nursing note reviewed.  Constitutional:      General: He is not in acute distress.    Appearance: Normal appearance.  HENT:     Head: Normocephalic and atraumatic.  Eyes:     General:        Right eye: No discharge.        Left eye: No discharge.  Cardiovascular:     Rate and Rhythm: Normal rate and regular rhythm.     Comments: 2+ radial and ulnar pulses in the left wrist Pulmonary:     Effort: Pulmonary effort is normal. No respiratory distress.  Musculoskeletal:        General: No deformity.     Comments: 5 out of 5 strength for flexion and extension of distal first phalanx of the left hand.  Skin:    General: Skin is warm and dry.     Capillary Refill: Capillary refill takes less than 2 seconds. Distal to injury    Comments: Approximately 2 to 3 cm linear laceration on the lateral aspect of the palmar surface of the thumb on the left hand.  Entire depth of wound visualized, no visualization of flexor tendon, or bone.  Neurological:     Mental Status: He is alert and oriented to person, place, and time.  Psychiatric:        Mood and Affect: Mood normal.        Behavior: Behavior normal.    ED Results / Procedures / Treatments   Labs (all labs ordered are listed, but only abnormal results are displayed) Labs  Reviewed - No data to display  EKG None  Radiology No results found.  Procedures .Marland KitchenLaceration Repair  Date/Time: 10/21/2020 9:41 AM Performed by: Anselmo Pickler, PA-C Authorized by: Anselmo Pickler, PA-C   Consent:    Consent obtained:  Verbal   Consent given by:  Patient   Risks, benefits, and alternatives were discussed: yes     Risks discussed:  Infection, pain and poor wound healing   Alternatives discussed:  No treatment Universal protocol:    Procedure explained and questions answered to patient or proxy's satisfaction: yes     Patient identity confirmed:  Verbally with patient Anesthesia:    Anesthesia method:  Nerve block and topical application   Topical anesthetic:  LET   Block location:  Ring block   Block needle gauge:  25 G   Block anesthetic:  Lidocaine 1% w/o epi   Block injection procedure:  Anatomic landmarks identified, introduced needle and negative aspiration for blood   Block outcome:  Anesthesia achieved Laceration details:    Location:  Finger   Finger location:  L thumb   Length (cm):  2.5   Depth (mm):  2 Exploration:    Hemostasis achieved with:  Direct pressure Treatment:    Area cleansed with:  Saline   Amount of cleaning:  Standard   Irrigation solution:  Sterile saline Skin repair:    Repair method:  Sutures   Suture size:  4-0   Suture material:  Nylon   Suture technique:  Simple interrupted   Number of sutures:  5 Approximation:    Approximation:  Close Repair type:    Repair type:  Simple Post-procedure details:    Dressing:  Non-adherent dressing   Procedure completion:  Tolerated Comments:     Initially felt that digital nerve block was not achieved, and so let was ordered  for topical anesthetic.  Let was removed prior to 30 minutes, because patient felt that nerve block had kicked in.   Medications Ordered in ED Medications  bacitracin ointment (has no administration in time range)  lidocaine (PF) (XYLOCAINE)  1 % injection 30 mL (30 mLs Infiltration Given by Other 10/21/20 0833)  Tdap (BOOSTRIX) injection 0.5 mL (0.5 mLs Intramuscular Given 10/21/20 0834)  lidocaine-EPINEPHrine-tetracaine (LET) topical gel (3 mLs Topical Given 10/21/20 0914)    ED Course  I have reviewed the triage vital signs and the nursing notes.  Pertinent labs & imaging results that were available during my care of the patient were reviewed by me and considered in my medical decision making (see chart for details).    MDM Rules/Calculators/A&P                         Full wound visualized and repaired as discussed in the procedure note above.  Left thumb is completely neurovascularly intact, sensation intact distal to the injury, 5 out of 5 strength for flexion extension of the distal phalanx.  Tendon and bone not visualized in the extent of the wound.  Wound was thoroughly cleaned, and given return precautions prior to discharge. Final Clinical Impression(s) / ED Diagnoses Final diagnoses:  Laceration of left thumb without foreign body without damage to nail, initial encounter    Rx / DC Orders ED Discharge Orders     None        Anselmo Pickler, PA-C 10/21/20 0944    Teressa Lower, MD 10/21/20 1552

## 2020-10-21 NOTE — ED Notes (Signed)
Patient stepped outside

## 2020-10-22 ENCOUNTER — Telehealth: Payer: Self-pay

## 2020-10-22 NOTE — Telephone Encounter (Signed)
Transition Care Management Unsuccessful Follow-up Telephone Call  Date of discharge and from where:  10/21/2020 from [facility]  Attempts:  1st Attempt  Reason for unsuccessful TCM follow-up call:  Left voice message

## 2020-10-23 NOTE — Telephone Encounter (Signed)
Transition Care Management Follow-up Telephone Call Date of discharge and from where: 10/21/2020 - [name] How have you been since you were released from the hospital? "My thumb is sore but it's doing better" Any questions or concerns? No  Items Reviewed: Did the pt receive and understand the discharge instructions provided? Yes  Medications obtained and verified?  N/A Other? No  Any new allergies since your discharge? No Dietary orders reviewed? No Do you have support at home? Yes    Functional Questionnaire: (I = Independent and D = Dependent) ADLs: I  Bathing/Dressing- I  Meal Prep- I  Eating- I  Maintaining continence- I  Transferring/Ambulation- I  Managing Meds- I  Follow up appointments reviewed:  PCP Hospital f/u appt confirmed? No   Specialist Hospital f/u appt confirmed? No   Are transportation arrangements needed? No  If their condition worsens, is the pt aware to call PCP or go to the Emergency Dept.? Yes Was the patient provided with contact information for the PCP's office or ED? Yes Was to pt encouraged to call back with questions or concerns? Yes

## 2020-10-24 ENCOUNTER — Telehealth: Payer: Self-pay | Admitting: Cardiology

## 2020-10-24 NOTE — Telephone Encounter (Signed)
Sent over via fax provided.

## 2020-10-24 NOTE — Telephone Encounter (Signed)
   Kim with [facility] requesting office notes and echo result fax to their office fax# [phone], she did not provide phone #

## 2020-11-19 ENCOUNTER — Telehealth: Payer: Self-pay

## 2020-11-19 NOTE — Telephone Encounter (Signed)
..   Medicaid Managed Care   Unsuccessful Outreach Note  11/19/2020 Name: [name] MRN: [id number] DOB: 1955/02/18  Referred by: [facility], [facility] Reason for referral : High Risk Managed Medicaid (I called the patient today to get him scheduled for a phone visit with the [facility] Team. I left my name and number on his VM.)   An unsuccessful telephone outreach was attempted today. The patient was referred to the case management team for assistance with care management and care coordination.   Follow Up Plan: The care management team will reach out to the patient again over the next 7-14 days.   [place]

## 2020-11-24 ENCOUNTER — Telehealth: Payer: Self-pay

## 2020-11-24 NOTE — Telephone Encounter (Signed)
..   Medicaid Managed Care   Unsuccessful Outreach Note  11/24/2020 Name: [name] MRN: [id number] DOB: Sep 24, 1955  Referred by: [facility] Reason for referral : High Risk Managed Medicaid (I called the patient today to get him scheduled for a phone visit with the [place]. I left my name and number on his VM.)   An unsuccessful telephone outreach was attempted today. The patient was referred to the case management team for assistance with care management and care coordination.   Follow Up Plan: The care management team will reach out to the patient again over the next 7-14 days.   [place]

## 2020-12-05 ENCOUNTER — Emergency Department (HOSPITAL_COMMUNITY)
Admission: EM | Admit: 2020-12-05 | Discharge: 2020-12-08 | Disposition: A | Discharge status: Other Acute Inpatient Hospital | Payer: No Typology Code available for payment source | Attending: Emergency Medicine | Admitting: Emergency Medicine

## 2020-12-05 ENCOUNTER — Encounter (HOSPITAL_COMMUNITY): Payer: Self-pay | Admitting: Emergency Medicine

## 2020-12-05 DIAGNOSIS — Z794 Long term (current) use of insulin: Secondary | ICD-10-CM | POA: Insufficient documentation

## 2020-12-05 DIAGNOSIS — Y900 Blood alcohol level of less than 20 mg/100 ml: Secondary | ICD-10-CM | POA: Insufficient documentation

## 2020-12-05 DIAGNOSIS — Z7984 Long term (current) use of oral hypoglycemic drugs: Secondary | ICD-10-CM | POA: Insufficient documentation

## 2020-12-05 DIAGNOSIS — I509 Heart failure, unspecified: Secondary | ICD-10-CM | POA: Diagnosis not present

## 2020-12-05 DIAGNOSIS — R45850 Homicidal ideations: Secondary | ICD-10-CM

## 2020-12-05 DIAGNOSIS — E785 Hyperlipidemia, unspecified: Secondary | ICD-10-CM | POA: Insufficient documentation

## 2020-12-05 DIAGNOSIS — Z7982 Long term (current) use of aspirin: Secondary | ICD-10-CM | POA: Diagnosis not present

## 2020-12-05 DIAGNOSIS — E1169 Type 2 diabetes mellitus with other specified complication: Secondary | ICD-10-CM | POA: Diagnosis not present

## 2020-12-05 DIAGNOSIS — Z7902 Long term (current) use of antithrombotics/antiplatelets: Secondary | ICD-10-CM | POA: Diagnosis not present

## 2020-12-05 DIAGNOSIS — F17210 Nicotine dependence, cigarettes, uncomplicated: Secondary | ICD-10-CM | POA: Diagnosis not present

## 2020-12-05 DIAGNOSIS — Z79899 Other long term (current) drug therapy: Secondary | ICD-10-CM | POA: Insufficient documentation

## 2020-12-05 DIAGNOSIS — R45851 Suicidal ideations: Secondary | ICD-10-CM | POA: Diagnosis not present

## 2020-12-05 DIAGNOSIS — F332 Major depressive disorder, recurrent severe without psychotic features: Secondary | ICD-10-CM | POA: Insufficient documentation

## 2020-12-05 DIAGNOSIS — I110 Hypertensive heart disease with heart failure: Secondary | ICD-10-CM | POA: Diagnosis not present

## 2020-12-05 DIAGNOSIS — Z046 Encounter for general psychiatric examination, requested by authority: Secondary | ICD-10-CM | POA: Diagnosis present

## 2020-12-05 DIAGNOSIS — Z85828 Personal history of other malignant neoplasm of skin: Secondary | ICD-10-CM | POA: Diagnosis not present

## 2020-12-05 DIAGNOSIS — F1410 Cocaine abuse, uncomplicated: Secondary | ICD-10-CM | POA: Diagnosis present

## 2020-12-05 DIAGNOSIS — I2510 Atherosclerotic heart disease of native coronary artery without angina pectoris: Secondary | ICD-10-CM | POA: Diagnosis not present

## 2020-12-05 DIAGNOSIS — Z20822 Contact with and (suspected) exposure to covid-19: Secondary | ICD-10-CM | POA: Insufficient documentation

## 2020-12-05 DIAGNOSIS — F4310 Post-traumatic stress disorder, unspecified: Secondary | ICD-10-CM | POA: Diagnosis present

## 2020-12-05 LAB — CBG MONITORING, ED: Glucose-Capillary: 191 mg/dL — ABNORMAL HIGH (ref 70–99)

## 2020-12-05 LAB — CBC WITH DIFFERENTIAL/PLATELET
Abs Immature Granulocytes: 0.01 10*3/uL (ref 0.00–0.07)
Basophils Absolute: 0 10*3/uL (ref 0.0–0.1)
Basophils Relative: 0 %
Eosinophils Absolute: 0.1 10*3/uL (ref 0.0–0.5)
Eosinophils Relative: 1 %
HCT: 44.3 % (ref 39.0–52.0)
Hemoglobin: 14.8 g/dL (ref 13.0–17.0)
Immature Granulocytes: 0 %
Lymphocytes Relative: 21 %
Lymphs Abs: 1 10*3/uL (ref 0.7–4.0)
MCH: 28.6 pg (ref 26.0–34.0)
MCHC: 33.4 g/dL (ref 30.0–36.0)
MCV: 85.7 fL (ref 80.0–100.0)
Monocytes Absolute: 0.3 10*3/uL (ref 0.1–1.0)
Monocytes Relative: 7 %
Neutro Abs: 3.3 10*3/uL (ref 1.7–7.7)
Neutrophils Relative %: 71 %
Platelets: 212 10*3/uL (ref 150–400)
RBC: 5.17 MIL/uL (ref 4.22–5.81)
RDW: 13.9 % (ref 11.5–15.5)
WBC: 4.6 10*3/uL (ref 4.0–10.5)
nRBC: 0 % (ref 0.0–0.2)

## 2020-12-05 LAB — RAPID URINE DRUG SCREEN, HOSP PERFORMED
Amphetamines: NOT DETECTED
Barbiturates: NOT DETECTED
Benzodiazepines: NOT DETECTED
Cocaine: POSITIVE — AB
Opiates: NOT DETECTED
Tetrahydrocannabinol: NOT DETECTED

## 2020-12-05 LAB — RESP PANEL BY RT-PCR (FLU A&B, COVID) ARPGX2
Influenza A by PCR: NEGATIVE
Influenza B by PCR: NEGATIVE
SARS Coronavirus 2 by RT PCR: NEGATIVE

## 2020-12-05 LAB — SALICYLATE LEVEL: Salicylate Lvl: <7 mg/dL — ABNORMAL LOW (ref 7.0–30.0)

## 2020-12-05 LAB — ETHANOL: Alcohol, Ethyl (B): <10 mg/dL (ref <10)

## 2020-12-05 LAB — COMPREHENSIVE METABOLIC PANEL
ALT: 17 U/L (ref 0–44)
AST: 18 U/L (ref 15–41)
Albumin: 3.7 g/dL (ref 3.5–5.0)
Alkaline Phosphatase: 50 U/L (ref 38–126)
Anion gap: 6 (ref 5–15)
BUN: 11 mg/dL (ref 8–23)
CO2: 24 mmol/L (ref 22–32)
Calcium: 9 mg/dL (ref 8.9–10.3)
Chloride: 105 mmol/L (ref 98–111)
Creatinine, Ser: 1.01 mg/dL (ref 0.61–1.24)
GFR, Estimated: >60 mL/min (ref >60)
Glucose, Bld: 222 mg/dL — ABNORMAL HIGH (ref 70–99)
Potassium: 3.9 mmol/L (ref 3.5–5.1)
Sodium: 135 mmol/L (ref 135–145)
Total Bilirubin: 0.7 mg/dL (ref 0.3–1.2)
Total Protein: 6.9 g/dL (ref 6.5–8.1)

## 2020-12-05 LAB — ACETAMINOPHEN LEVEL: Acetaminophen (Tylenol), Serum: <10 ug/mL — ABNORMAL LOW (ref 10–30)

## 2020-12-05 MED ORDER — TRAZODONE HCL 50 MG PO TABS
50.0000 mg | ORAL_TABLET | Freq: Every evening | ORAL | Status: DC | PRN
  Administered 2020-12-06 (×2): 50 mg via ORAL
  Filled 2020-12-05 (×2): qty 1

## 2020-12-05 MED ORDER — METFORMIN HCL ER 500 MG PO TB24
1000.0000 mg | ORAL_TABLET | Freq: Every day | ORAL | Status: DC
  Administered 2020-12-06 – 2020-12-08 (×4): 1000 mg via ORAL
  Filled 2020-12-05 (×3): qty 2

## 2020-12-05 MED ORDER — LAMOTRIGINE 100 MG PO TABS
100.0000 mg | ORAL_TABLET | Freq: Two times a day (BID) | ORAL | Status: DC
  Administered 2020-12-06 – 2020-12-08 (×6): 100 mg via ORAL
  Filled 2020-12-05 (×6): qty 1

## 2020-12-05 MED ORDER — PANTOPRAZOLE SODIUM 40 MG PO TBEC
40.0000 mg | DELAYED_RELEASE_TABLET | Freq: Every day | ORAL | Status: DC
  Administered 2020-12-06 – 2020-12-08 (×4): 40 mg via ORAL
  Filled 2020-12-05 (×4): qty 1

## 2020-12-05 MED ORDER — LORAZEPAM 1 MG PO TABS
0.0000 mg | ORAL_TABLET | Freq: Four times a day (QID) | ORAL | Status: AC
  Administered 2020-12-06: 2 mg via ORAL
  Filled 2020-12-05: qty 2

## 2020-12-05 MED ORDER — LORAZEPAM 2 MG/ML IJ SOLN: 0.0000 mg | Freq: Four times a day (QID) | INTRAMUSCULAR | Status: AC

## 2020-12-05 MED ORDER — LORAZEPAM 1 MG PO TABS
0.0000 mg | ORAL_TABLET | Freq: Two times a day (BID) | ORAL | Status: DC
  Administered 2020-12-08: 1 mg via ORAL
  Filled 2020-12-05: qty 1

## 2020-12-05 MED ORDER — ASPIRIN EC 81 MG PO TBEC
81.0000 mg | DELAYED_RELEASE_TABLET | Freq: Every day | ORAL | Status: DC
  Administered 2020-12-06 – 2020-12-08 (×3): 81 mg via ORAL
  Filled 2020-12-05 (×3): qty 1

## 2020-12-05 MED ORDER — FUROSEMIDE 20 MG PO TABS
20.0000 mg | ORAL_TABLET | Freq: Every day | ORAL | Status: DC | PRN
  Filled 2020-12-05: qty 1

## 2020-12-05 MED ORDER — DULOXETINE HCL 20 MG PO CPEP
20.0000 mg | ORAL_CAPSULE | Freq: Every day | ORAL | Status: DC
  Administered 2020-12-06 – 2020-12-08 (×4): 20 mg via ORAL
  Filled 2020-12-05 (×4): qty 1

## 2020-12-05 MED ORDER — LORAZEPAM 2 MG/ML IJ SOLN: 0.0000 mg | Freq: Two times a day (BID) | INTRAMUSCULAR | Status: DC

## 2020-12-05 MED ORDER — THIAMINE HCL 100 MG/ML IJ SOLN: 100.0000 mg | Freq: Every day | INTRAMUSCULAR | Status: DC

## 2020-12-05 MED ORDER — METOPROLOL SUCCINATE ER 50 MG PO TB24
25.0000 mg | ORAL_TABLET | Freq: Every day | ORAL | Status: DC
  Administered 2020-12-06 – 2020-12-08 (×4): 25 mg via ORAL
  Filled 2020-12-05 (×4): qty 1

## 2020-12-05 MED ORDER — METFORMIN HCL ER 500 MG PO TB24
500.0000 mg | ORAL_TABLET | Freq: Every day | ORAL | Status: DC
  Administered 2020-12-06 – 2020-12-08 (×3): 500 mg via ORAL
  Filled 2020-12-05 (×5): qty 1

## 2020-12-05 MED ORDER — CLOPIDOGREL BISULFATE 75 MG PO TABS
75.0000 mg | ORAL_TABLET | Freq: Every day | ORAL | Status: DC
  Administered 2020-12-06 – 2020-12-08 (×4): 75 mg via ORAL
  Filled 2020-12-05 (×4): qty 1

## 2020-12-05 MED ORDER — THIAMINE HCL 100 MG PO TABS
100.0000 mg | ORAL_TABLET | Freq: Every day | ORAL | Status: DC
  Administered 2020-12-06 – 2020-12-08 (×3): 100 mg via ORAL
  Filled 2020-12-05 (×3): qty 1

## 2020-12-05 MED ORDER — GABAPENTIN 300 MG PO CAPS
600.0000 mg | ORAL_CAPSULE | Freq: Every evening | ORAL | Status: DC | PRN
  Administered 2020-12-06: 600 mg via ORAL
  Filled 2020-12-05: qty 2

## 2020-12-05 NOTE — ED Notes (Signed)
Pt ambulatory without assistance.  

## 2020-12-05 NOTE — ED Notes (Addendum)
UTO Phlebotomy blood collection. Pt expressed "You don't know what you are doing, you are not doing it right, get somebody else." And removed needle.
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
## 2020-12-05 NOTE — ED Triage Notes (Signed)
Pt arriving with [employer] for suicidal ideations. Pt reported to police he wanted to come to [facility] and get help because he was having a lot of thoughts of harming himself. Pt calm and cooperative at this time.

## 2020-12-05 NOTE — ED Provider Notes (Signed)
[facility] DEPT Provider Note   CSN: [id number] Arrival date & time: 12/05/20  1117     History Chief Complaint  Patient presents with   Suicidal    [name] is a 65 y.o. male with a past medical history significant for PTSD, renal cell carcinoma, hyperlipidemia, hypertension, depression, bipolar 1 disorder, diabetes, and CAD who presents to the ED due to suicidal ideations.  Patient states he has plans to shoot himself.  Patient admits to having a gun however, will not tell me the location of the gun.  He endorses a previous suicide attempt by jumping in moving traffic.  Patient denies HI and auditory/visual hallucinations.  Patient notes he recently relapsed and has been using crack cocaine.  Admits to drinking a sixpack of beer daily.  No physical complaints.  No treatment prior to arrival.  No aggravating or alleviating factors.  Patient is here voluntarily.  History obtained from patient and past medical records. No interpreter used during encounter.       Past Medical History:  Diagnosis Date   Anxiety    Arthritis    Cancer ([place])    Cataract    Depression    Diabetes mellitus    GERD (gastroesophageal reflux disease)    H/O suicide attempt 1997 and 2011   jumped out of moving vehicle and attempted to jump into moving traffic   High cholesterol    Hypertension    Neuropathic pain    Osteoporosis     Patient Active Problem List   Diagnosis Date Noted   S/P coronary artery stent placement (DES to LCx) 10/11/2020   Pleural effusion due to congestive heart failure ([place]) 01/11/2020   Aortic atherosclerosis ([place]) 01/11/2020   Coronary artery disease 01/11/2020   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   Orthostatic hypotension 01/10/2020   HFrEF (heart failure with reduced ejection fraction) ([place]) 01/06/2020   Tobacco use disorder 01/06/2020   Obesity 01/06/2020   Bilateral lower extremity edema 01/04/2020   Shortness of breath 01/04/2020    MDD (major depressive disorder), recurrent severe, without psychosis ([place]) 10/09/2016   Diabetes type 2, controlled ([place]) 01/01/2015   Erectile dysfunction 01/01/2015   Healthcare maintenance 01/01/2015   Bipolar 1 disorder, depressed ([place]) 07/23/2013   Bipolar disorder ([place]) 04/03/2013   MDD (major depressive disorder), recurrent, severe, with psychosis ([place]) 11/04/2012   Suicidal ideation 11/04/2012   Post traumatic stress disorder (PTSD) 11/04/2012   CARCINOMA, RENAL CELL 06/05/2008   HLD (hyperlipidemia) 06/05/2008   Essential hypertension 06/05/2008   CHEST PAIN 06/05/2008    Past Surgical History:  Procedure Laterality Date   COLONOSCOPY     CORONARY STENT INTERVENTION N/A 10/10/2020   Procedure: CORONARY STENT INTERVENTION;  Surgeon: Jettie Booze, MD;  Location: [facility];  Service: Cardiovascular;  Laterality: N/A;   LEFT HEART CATH AND CORONARY ANGIOGRAPHY N/A 10/10/2020   Procedure: LEFT HEART CATH AND CORONARY ANGIOGRAPHY;  Surgeon: Jettie Booze, MD;  Location: [facility];  Service: Cardiovascular;  Laterality: N/A;   NEPHRECTOMY     due to cancer   RIGHT/LEFT HEART CATH AND CORONARY ANGIOGRAPHY N/A 10/09/2020   Procedure: RIGHT/LEFT HEART CATH AND CORONARY ANGIOGRAPHY;  Surgeon: Nelva Bush, MD;  Location: [facility];  Service: Cardiovascular;  Laterality: N/A;       Family History  Problem Relation Age of Onset   Alcohol abuse Sister    Alcohol abuse Brother    Colon cancer Neg Hx  Esophageal cancer Neg Hx    Pancreatic cancer Neg Hx    Rectal cancer Neg Hx    Stomach cancer Neg Hx     Social History   Tobacco Use   Smoking status: Every Day    Packs/day: 0.50    Years: 20.00    Pack years: 10.00    Types: Cigarettes   Smokeless tobacco: Never  Vaping Use   Vaping Use: Never used  Substance Use Topics   Alcohol use: No    Alcohol/week: 3.0 standard drinks    Types: 3 Cans of beer per week    Comment: Pt  has been clean for 8 months   Drug use: No    Home Medications Prior to Admission medications   Medication Sig Start Date End Date Taking? Authorizing Provider  acetaminophen (TYLENOL) 325 MG tablet Take 2 tablets (650 mg total) by mouth every 4 (four) hours as needed for headache or mild pain. 10/11/20   Richardson Dopp T, PA-C  ARIPiprazole (ABILIFY) 10 MG tablet Take 5 mg by mouth daily.    [provider]  aspirin EC 81 MG tablet Take 81 mg by mouth daily. Swallow whole.    [provider]  clopidogrel (PLAVIX) 75 MG tablet Take 1 tablet (75 mg total) by mouth daily. 10/12/20 10/12/21  Richardson Dopp T, PA-C  DULoxetine (CYMBALTA) 20 MG capsule Take 20 mg by mouth daily.    [provider]  empagliflozin (JARDIANCE) 25 MG TABS tablet Take 12.5 mg by mouth daily.    [provider]  furosemide (LASIX) 20 MG tablet Take 20 mg by mouth daily as needed for fluid.    [provider]  gabapentin (NEURONTIN) 600 MG tablet Take 600 mg by mouth daily.    [provider]  insulin glargine (LANTUS) 100 UNIT/ML injection Inject 0.4 mLs (40 Units total) into the skin at bedtime. Patient taking differently: Inject 25 Units into the skin at bedtime. 01/25/20   [name], MD  lamoTRIgine (LAMICTAL) 200 MG tablet Take 100 mg by mouth 2 (two) times daily.    [provider]  lidocaine (LIDODERM) 5 % Place 1 patch onto the skin daily. Remove & Discard patch within 12 hours or as directed by MD apply 1 patch on each knee    [provider]  metFORMIN (GLUCOPHAGE-XR) 500 MG 24 hr tablet Take 1-2 tablets (500-1,000 mg total) by mouth See admin instructions. Take 1 tablet by mouth every morning, and take 2 tablets every evening 10/13/20   Richardson Dopp T, PA-C  metoprolol succinate (TOPROL-XL) 25 MG 24 hr tablet Take 1 tablet (25 mg total) by mouth daily. 10/12/20 10/12/21  Richardson Dopp T, PA-C  Multiple Vitamin (MULTIVITAMIN WITH MINERALS) TABS  tablet Take 1 tablet by mouth daily.    [provider]  nicotine polacrilex (COMMIT) 2 MG lozenge Take 2 mg by mouth every 2 (two) hours as needed for smoking cessation.    [provider]  pantoprazole (PROTONIX) 40 MG tablet Take 1 tablet (40 mg total) by mouth daily. 10/12/20 12/11/20  Richardson Dopp T, PA-C  rosuvastatin (CRESTOR) 40 MG tablet Take 40 mg by mouth daily.    [provider]  traZODone (DESYREL) 50 MG tablet Take 50 mg by mouth at bedtime as needed for sleep.    [provider]    Allergies    Morphine and related  Review of Systems   Review of Systems  Constitutional:  Negative for fever.  Respiratory:  Negative for shortness of breath.   Cardiovascular:  Negative for chest pain.  Gastrointestinal:  Negative for abdominal pain.  Psychiatric/Behavioral:  Positive for suicidal ideas.   All other systems reviewed and are negative.  Physical Exam Updated Vital Signs BP 107/73   Pulse 80   Temp 97.6 F (36.4 C) (Oral)   Resp 18   SpO2 100%   Physical Exam Vitals and nursing note reviewed.  Constitutional:      General: He is not in acute distress.    Appearance: He is not ill-appearing.  HENT:     Head: Normocephalic.  Eyes:     Pupils: Pupils are equal, round, and reactive to light.  Cardiovascular:     Rate and Rhythm: Normal rate and regular rhythm.     Pulses: Normal pulses.     Heart sounds: Normal heart sounds. No murmur heard.   No friction rub. No gallop.  Pulmonary:     Effort: Pulmonary effort is normal.     Breath sounds: Normal breath sounds.  Abdominal:     General: Abdomen is flat. There is no distension.     Palpations: Abdomen is soft.     Tenderness: There is no abdominal tenderness. There is no guarding or rebound.  Musculoskeletal:        General: Normal range of motion.     Cervical back: Neck supple.  Skin:    General: Skin is warm and dry.  Neurological:     General: No focal deficit present.      Mental Status: He is alert.  Psychiatric:        Mood and Affect: Mood normal.        Behavior: Behavior normal.        Thought Content: Thought content includes suicidal ideation. Thought content does not include homicidal ideation. Thought content includes suicidal plan.    ED Results / Procedures / Treatments   Labs (all labs ordered are listed, but only abnormal results are displayed) Labs Reviewed  COMPREHENSIVE METABOLIC PANEL - Abnormal; Notable for the following components:      Result Value   Glucose, Bld 222 (*)    All other components within normal limits  RAPID URINE DRUG SCREEN, HOSP PERFORMED - Abnormal; Notable for the following components:   Cocaine POSITIVE (*)    All other components within normal limits  ACETAMINOPHEN LEVEL - Abnormal; Notable for the following components:   Acetaminophen (Tylenol), Serum <10 (*)    All other components within normal limits  SALICYLATE LEVEL - Abnormal; Notable for the following components:   Salicylate Lvl <8.4 (*)    All other components within normal limits  RESP PANEL BY RT-PCR (FLU A&B, COVID) ARPGX2  ETHANOL  CBC WITH DIFFERENTIAL/PLATELET    EKG None  Radiology No results found.  Procedures Procedures   Medications Ordered in ED Medications  LORazepam (ATIVAN) injection 0-4 mg (has no administration in time range)    Or  LORazepam (ATIVAN) tablet 0-4 mg (has no administration in time range)  LORazepam (ATIVAN) injection 0-4 mg (has no administration in time range)    Or  LORazepam (ATIVAN) tablet 0-4 mg (has no administration in time range)  thiamine tablet 100 mg (has no administration in time range)    Or  thiamine (B-1) injection 100 mg (has no administration in time range)    ED Course  I have reviewed the triage vital signs and the nursing notes.  Pertinent labs & imaging results  that were available during my care of the patient were reviewed by me and considered in my medical decision making  (see chart for details).    MDM Rules/Calculators/A&P                          65 year old male presents to the ED voluntarily due to suicidal ideations.  Patient states he has a plan to shoot himself in the head with a gun.  Patient admits to having a gun however, will not disclose location.  Upon arrival, vitals all within normal limits.  Patient in no acute distress.  Benign physical exam.  Medical clearance labs ordered.  CBC unremarkable.  No leukocytosis and normal hemoglobin.  Ethanol, salicylate, acetaminophen level within normal limits.  CMP significant for hyperglycemia 222.  No anion gap.  Normal renal function.  No major electrolyte derangements.  Patient has been medically cleared for TTS evaluation. CIWA protocol in place given patient drinks alcohol daily.   The patient has been placed in psychiatric observation due to the need to provide a safe environment for the patient while obtaining psychiatric consultation and evaluation, as well as ongoing medical and medication management to treat the patient's condition.  The patient has not been placed under full IVC at this time.  Final Clinical Impression(s) / ED Diagnoses Final diagnoses:  Suicidal ideation    Rx / DC Orders ED Discharge Orders     None        Karie Kirks 12/05/20 1510    Luna Fuse, MD 12/05/20 [phone]

## 2020-12-06 ENCOUNTER — Other Ambulatory Visit: Payer: Self-pay

## 2020-12-06 DIAGNOSIS — R45850 Homicidal ideations: Secondary | ICD-10-CM

## 2020-12-06 DIAGNOSIS — F1410 Cocaine abuse, uncomplicated: Secondary | ICD-10-CM | POA: Diagnosis present

## 2020-12-06 DIAGNOSIS — R45851 Suicidal ideations: Secondary | ICD-10-CM | POA: Diagnosis not present

## 2020-12-06 NOTE — ED Provider Notes (Signed)
Emergency Medicine Observation Re-evaluation Note  [name] is a 65 y.o. male, seen on rounds today.  Pt initially presented to the ED for complaints of Suicidal Currently, the patient is resting comfortably.  Physical Exam  BP 135/85 (BP Location: Right Arm)   Pulse 82   Temp 98.4 F (36.9 C) (Oral)   Resp 18   SpO2 99%  Physical Exam General: Nontoxic. Cardiac: Normal heart rate Lungs: Normal respiratory rate Psych: Not responding to internal stimuli  ED Course / MDM  EKG:   I have reviewed the labs performed to date as well as medications administered while in observation.  Recent changes in the last 24 hours include remaining cooperative.  Plan  Current plan is for psychiatric admission.  [name] is under involuntary commitment.     Daleen Bo, MD 12/07/20 [phone]

## 2020-12-06 NOTE — Consult Note (Signed)
[facility] Psych ED Progress Note  12/06/2020 11:14 AM [name]  MRN:  [id number]   Method of visit?: Face to Face   Subjective:   "If I go home, I'm going to kill her and then myself".   Patient laying in bed. Described situation of "people" being at his home and in his neighborhood who are after him to rob him where he plans on killing them and himself.   Patient endorsing homicidal and suicidal ideations; he denies any auditory or visual hallucinations. He does not appear to be actively psychotic and does not appear to be responding to any external/internal stimuli at this time.   Principal Problem: Suicidal ideation Diagnosis:  Principal Problem:   Suicidal ideation Active Problems:   Post traumatic stress disorder (PTSD)   Cocaine use disorder ([place])   Homicidal ideation  Total Time spent with patient: 20 minutes  Past Psychiatric History: PTSD, MDD, suicidal ideation, bipolar disorder,   Past Medical History:  Past Medical History:  Diagnosis Date   Anxiety    Arthritis    Cancer ([place])    Cataract    Depression    Diabetes mellitus    GERD (gastroesophageal reflux disease)    H/O suicide attempt 1997 and 2011   jumped out of moving vehicle and attempted to jump into moving traffic   High cholesterol    Hypertension    Neuropathic pain    Osteoporosis     Past Surgical History:  Procedure Laterality Date   COLONOSCOPY     CORONARY STENT INTERVENTION N/A 10/10/2020   Procedure: CORONARY STENT INTERVENTION;  Surgeon: Jettie Booze, MD;  Location: [place] [facility];  Service: Cardiovascular;  Laterality: N/A;   LEFT HEART CATH AND CORONARY ANGIOGRAPHY N/A 10/10/2020   Procedure: LEFT HEART CATH AND CORONARY ANGIOGRAPHY;  Surgeon: Jettie Booze, MD;  Location: [place] [facility];  Service: Cardiovascular;  Laterality: N/A;   NEPHRECTOMY     due to cancer   RIGHT/LEFT HEART CATH AND CORONARY ANGIOGRAPHY N/A 10/09/2020   Procedure: RIGHT/LEFT HEART  CATH AND CORONARY ANGIOGRAPHY;  Surgeon: Nelva Bush, MD;  Location: [place] [facility];  Service: Cardiovascular;  Laterality: N/A;   Family History:  Family History  Problem Relation Age of Onset   Alcohol abuse Sister    Alcohol abuse Brother    Colon cancer Neg Hx    Esophageal cancer Neg Hx    Pancreatic cancer Neg Hx    Rectal cancer Neg Hx    Stomach cancer Neg Hx    Family Psychiatric  History: not noted Social History:  Social History   Substance and Sexual Activity  Alcohol Use No   Alcohol/week: 3.0 standard drinks   Types: 3 Cans of beer per week   Comment: Pt has been clean for 8 months     Social History   Substance and Sexual Activity  Drug Use No    Social History   Socioeconomic History   Marital status: Single    Spouse name: Not on file   Number of children: Not on file   Years of education: Not on file   Highest education level: Not on file  Occupational History   Not on file  Tobacco Use   Smoking status: Every Day    Packs/day: 0.50    Years: 20.00    Pack years: 10.00    Types: Cigarettes   Smokeless tobacco: Never  Vaping Use   Vaping Use: Never used  Substance  and Sexual Activity   Alcohol use: No    Alcohol/week: 3.0 standard drinks    Types: 3 Cans of beer per week    Comment: Pt has been clean for 8 months   Drug use: No   Sexual activity: Yes    Birth control/protection: Condom  Other Topics Concern   Not on file  Social History Narrative   Lives alone.  Has sister in area.   Social Determinants of Health   Financial Resource Strain: Not on file  Food Insecurity: Not on file  Transportation Needs: Not on file  Physical Activity: Not on file  Stress: Not on file  Social Connections: Not on file    Sleep: Fair  Appetite:  Fair  Current Medications: Current Facility-Administered Medications  Medication Dose Route Frequency Provider Last Rate Last Admin   aspirin EC tablet 81 mg  81 mg Oral Daily Loni Beckwith, PA-C   81 mg at 12/06/20 7353   clopidogrel (PLAVIX) tablet 75 mg  75 mg Oral Daily Loni Beckwith, PA-C   75 mg at 12/06/20 2992   DULoxetine (CYMBALTA) DR capsule 20 mg  20 mg Oral Daily Loni Beckwith, PA-C   20 mg at 12/06/20 4268   furosemide (LASIX) tablet 20 mg  20 mg Oral Daily PRN Loni Beckwith, PA-C       gabapentin (NEURONTIN) capsule 600 mg  600 mg Oral QHS PRN Loni Beckwith, PA-C   600 mg at 12/06/20 0011   lamoTRIgine (LAMICTAL) tablet 100 mg  100 mg Oral BID Loni Beckwith, PA-C   100 mg at 12/06/20 0957   LORazepam (ATIVAN) injection 0-4 mg  0-4 mg Intravenous Q6H Aberman, Caroline C, PA-C       Or   LORazepam (ATIVAN) tablet 0-4 mg  0-4 mg Oral Q6H Aberman, Caroline C, PA-C   2 mg at 12/06/20 0011   [START ON 12/07/2020] LORazepam (ATIVAN) injection 0-4 mg  0-4 mg Intravenous Q12H Suzy Bouchard, PA-C       Or   [START ON 12/07/2020] LORazepam (ATIVAN) tablet 0-4 mg  0-4 mg Oral Q12H Aberman, Caroline C, PA-C       metFORMIN (GLUCOPHAGE-XR) 24 hr tablet 1,000 mg  1,000 mg Oral Q supper Loni Beckwith, PA-C   1,000 mg at 12/06/20 0009   metFORMIN (GLUCOPHAGE-XR) 24 hr tablet 500 mg  500 mg Oral Q breakfast Hong, Joshua S, MD   500 mg at 12/06/20 0957   metoprolol succinate (TOPROL-XL) 24 hr tablet 25 mg  25 mg Oral Daily Loni Beckwith, PA-C   25 mg at 12/06/20 3419   pantoprazole (PROTONIX) EC tablet 40 mg  40 mg Oral Daily Loni Beckwith, PA-C   40 mg at 12/06/20 0957   thiamine tablet 100 mg  100 mg Oral Daily Suzy Bouchard, PA-C   100 mg at 12/06/20 6222   Or   thiamine (B-1) injection 100 mg  100 mg Intravenous Daily Suzy Bouchard, PA-C       traZODone (DESYREL) tablet 50 mg  50 mg Oral QHS PRN Loni Beckwith, PA-C   50 mg at 12/06/20 0011   Current Outpatient Medications  Medication Sig Dispense Refill   acetaminophen (TYLENOL) 325 MG tablet Take 2 tablets (650 mg total) by mouth every 4  (four) hours as needed for headache or mild pain.     ARIPiprazole (ABILIFY) 10 MG tablet Take 5 mg by mouth daily.  aspirin EC 81 MG tablet Take 81 mg by mouth daily. Swallow whole.     clopidogrel (PLAVIX) 75 MG tablet Take 1 tablet (75 mg total) by mouth daily. 30 tablet 11   DULoxetine (CYMBALTA) 20 MG capsule Take 20 mg by mouth daily.     empagliflozin (JARDIANCE) 25 MG TABS tablet Take 12.5 mg by mouth daily.     furosemide (LASIX) 20 MG tablet Take 20 mg by mouth daily as needed for fluid.     gabapentin (NEURONTIN) 600 MG tablet Take 600 mg by mouth at bedtime as needed (knee pain).     lamoTRIgine (LAMICTAL) 200 MG tablet Take 100 mg by mouth 2 (two) times daily.     lidocaine (LIDODERM) 5 % Place 1 patch onto the skin daily. Remove & Discard patch within 12 hours or as directed by MD apply 1 patch on each knee     metFORMIN (GLUCOPHAGE-XR) 500 MG 24 hr tablet Take 1-2 tablets (500-1,000 mg total) by mouth See admin instructions. Take 1 tablet by mouth every morning, and take 2 tablets every evening     metoprolol succinate (TOPROL-XL) 25 MG 24 hr tablet Take 1 tablet (25 mg total) by mouth daily. 30 tablet 11   Multiple Vitamin (MULTIVITAMIN WITH MINERALS) TABS tablet Take 1 tablet by mouth daily.     nicotine polacrilex (COMMIT) 2 MG lozenge Take 2 mg by mouth every 2 (two) hours as needed for smoking cessation.     omeprazole (PRILOSEC) 20 MG capsule Take 20 mg by mouth daily.     pantoprazole (PROTONIX) 40 MG tablet Take 1 tablet (40 mg total) by mouth daily. 30 tablet 1   rosuvastatin (CRESTOR) 40 MG tablet Take 40 mg by mouth daily.     traZODone (DESYREL) 50 MG tablet Take 50 mg by mouth at bedtime as needed for sleep.     insulin glargine (LANTUS) 100 UNIT/ML injection Inject 0.4 mLs (40 Units total) into the skin at bedtime. (Patient taking differently: Inject 25 Units into the skin at bedtime.) 10 mL 11   sacubitril-valsartan (ENTRESTO) 49-51 MG Take 0.5 tablets by mouth 2  (two) times daily.      Lab Results:  Results for orders placed or performed during the hospital encounter of 12/05/20 (from the past 48 hour(s))  Comprehensive metabolic panel     Status: Abnormal   Collection Time: 12/05/20 11:50 AM  Result Value Ref Range   Sodium 135 135 - 145 mmol/L   Potassium 3.9 3.5 - 5.1 mmol/L   Chloride 105 98 - 111 mmol/L   CO2 24 22 - 32 mmol/L   Glucose, Bld 222 (H) 70 - 99 mg/dL    Comment: Glucose reference range applies only to samples taken after fasting for at least 8 hours.   BUN 11 8 - 23 mg/dL   Creatinine, Ser 1.01 0.61 - 1.24 mg/dL   Calcium 9.0 8.9 - 10.3 mg/dL   Total Protein 6.9 6.5 - 8.1 g/dL   Albumin 3.7 3.5 - 5.0 g/dL   AST 18 15 - 41 U/L   ALT 17 0 - 44 U/L   Alkaline Phosphatase 50 38 - 126 U/L   Total Bilirubin 0.7 0.3 - 1.2 mg/dL   GFR, Estimated >60 >60 mL/min    Comment: (NOTE) Calculated using the CKD-EPI Creatinine Equation (2021)    Anion gap 6 5 - 15    Comment: Performed at [facility], [place] [address] [place], [place] [address]  Ethanol     Status: None   Collection Time: 12/05/20 11:50 AM  Result Value Ref Range   Alcohol, Ethyl (B) <10 <10 mg/dL    Comment: (NOTE) Lowest detectable limit for serum alcohol is 10 mg/dL.  For medical purposes only. Performed at [facility], [place] [address], [place], [place] [address]   CBC with Diff     Status: None   Collection Time: 12/05/20 11:50 AM  Result Value Ref Range   WBC 4.6 4.0 - 10.5 K/uL   RBC 5.17 4.22 - 5.81 MIL/uL   Hemoglobin 14.8 13.0 - 17.0 g/dL   HCT 44.3 39.0 - 52.0 %   MCV 85.7 80.0 - 100.0 fL   MCH 28.6 26.0 - 34.0 pg   MCHC 33.4 30.0 - 36.0 g/dL   RDW 13.9 11.5 - 15.5 %   Platelets 212 150 - 400 K/uL   nRBC 0.0 0.0 - 0.2 %   Neutrophils Relative % 71 %   Neutro Abs 3.3 1.7 - 7.7 K/uL   Lymphocytes Relative 21 %   Lymphs Abs 1.0 0.7 - 4.0 K/uL   Monocytes Relative 7 %   Monocytes Absolute 0.3 0.1 - 1.0  K/uL   Eosinophils Relative 1 %   Eosinophils Absolute 0.1 0.0 - 0.5 K/uL   Basophils Relative 0 %   Basophils Absolute 0.0 0.0 - 0.1 K/uL   Immature Granulocytes 0 %   Abs Immature Granulocytes 0.01 0.00 - 0.07 K/uL    Comment: Performed at [facility], [place] [address], [place], [place] [address]  Acetaminophen level     Status: Abnormal   Collection Time: 12/05/20 11:50 AM  Result Value Ref Range   Acetaminophen (Tylenol), Serum <10 (L) 10 - 30 ug/mL    Comment: (NOTE) Therapeutic concentrations vary significantly. A range of 10-30 ug/mL  may be an effective concentration for many patients. However, some  are best treated at concentrations outside of this range. Acetaminophen concentrations >150 ug/mL at 4 hours after ingestion  and >50 ug/mL at 12 hours after ingestion are often associated with  toxic reactions.  Performed at [facility], [place] [address] [place], [place] [address]   Salicylate level     Status: Abnormal   Collection Time: 12/05/20 11:50 AM  Result Value Ref Range   Salicylate Lvl <3.7 (L) 7.0 - 30.0 mg/dL    Comment: Performed at [facility], [place] [address] [place],  [address]  Resp Panel by RT-PCR (Flu A&B, Covid) Nasopharyngeal Swab     Status: None   Collection Time: 12/05/20 12:47 PM   Specimen: Nasopharyngeal Swab; Nasopharyngeal(NP) swabs in vial transport medium  Result Value Ref Range   SARS Coronavirus 2 by RT PCR NEGATIVE NEGATIVE    Comment: (NOTE) SARS-CoV-2 target nucleic acids are NOT DETECTED.  The SARS-CoV-2 RNA is generally detectable in upper respiratory specimens during the acute phase of infection. The lowest concentration of SARS-CoV-2 viral copies this assay can detect is 138 copies/mL. A negative result does not preclude SARS-Cov-2 infection and should not be used as the sole basis for treatment or other patient management decisions. A negative result may occur with   improper specimen collection/handling, submission of specimen other than nasopharyngeal swab, presence of viral mutation(s) within the areas targeted by this assay, and inadequate number of viral copies(<138 copies/mL). A negative result must be combined with clinical observations, patient history, and epidemiological information. The expected result is Negative.  Fact  Sheet for Patients:  EntrepreneurPulse.com.au  Fact Sheet for Healthcare Providers:  IncredibleEmployment.be  This test is no t yet approved or cleared by the [place] FDA and  has been authorized for detection and/or diagnosis of SARS-CoV-2 by FDA under an Emergency Use Authorization (EUA). This EUA will remain  in effect (meaning this test can be used) for the duration of the COVID-19 declaration under Section 564(b)(1) of the Act, 21 U.S.C.section 360bbb-3(b)(1), unless the authorization is terminated  or revoked sooner.       Influenza A by PCR NEGATIVE NEGATIVE   Influenza B by PCR NEGATIVE NEGATIVE    Comment: (NOTE) The Xpert Xpress SARS-CoV-2/FLU/RSV plus assay is intended as an aid in the diagnosis of influenza from Nasopharyngeal swab specimens and should not be used as a sole basis for treatment. Nasal washings and aspirates are unacceptable for Xpert Xpress SARS-CoV-2/FLU/RSV testing.  Fact Sheet for Patients: EntrepreneurPulse.com.au  Fact Sheet for Healthcare Providers: IncredibleEmployment.be  This test is not yet approved or cleared by the [place] FDA and has been authorized for detection and/or diagnosis of SARS-CoV-2 by FDA under an Emergency Use Authorization (EUA). This EUA will remain in effect (meaning this test can be used) for the duration of the COVID-19 declaration under Section 564(b)(1) of the Act, 21 U.S.C. section 360bbb-3(b)(1), unless the authorization is terminated or revoked.  Performed at  [facility], [place] [address], [place], [place] [address]   Urine rapid drug screen (hosp performed)     Status: Abnormal   Collection Time: 12/05/20 12:48 PM  Result Value Ref Range   Opiates NONE DETECTED NONE DETECTED   Cocaine POSITIVE (A) NONE DETECTED   Benzodiazepines NONE DETECTED NONE DETECTED   Amphetamines NONE DETECTED NONE DETECTED   Tetrahydrocannabinol NONE DETECTED NONE DETECTED   Barbiturates NONE DETECTED NONE DETECTED    Comment: (NOTE) DRUG SCREEN FOR MEDICAL PURPOSES ONLY.  IF CONFIRMATION IS NEEDED FOR ANY PURPOSE, NOTIFY LAB WITHIN 5 DAYS.  LOWEST DETECTABLE LIMITS FOR URINE DRUG SCREEN Drug Class                     Cutoff (ng/mL) Amphetamine and metabolites    1000 Barbiturate and metabolites    200 Benzodiazepine                 671 Tricyclics and metabolites     300 Opiates and metabolites        300 Cocaine and metabolites        300 THC                            50 Performed at [facility], [place] [address], [place], [place] [address]   CBG monitoring, ED     Status: Abnormal   Collection Time: 12/05/20 11:55 PM  Result Value Ref Range   Glucose-Capillary 191 (H) 70 - 99 mg/dL    Comment: Glucose reference range applies only to samples taken after fasting for at least 8 hours.    Blood Alcohol level:  Lab Results  Component Value Date   ETH <10 12/05/2020   ETH <5 10/08/2016    Physical Findings: AIMS:  , ,  ,  ,    CIWA:  CIWA-Ar Total: 0 COWS:     Musculoskeletal: Strength & Muscle Tone: within normal limits Gait & Station: normal Patient leans: N/A  Psychiatric Specialty Exam:  Presentation  General Appearance:  Casual  Eye Contact:Fair  Speech:Clear and Coherent  Speech Volume:Normal  Handedness:No data recorded  Mood and Affect  Mood:Dysphoric; Hopeless  Affect:Depressed   Thought Process  Thought Processes:Goal Directed  Descriptions of  Associations:Intact  Orientation:Full (Time, Place and Person)  Thought Content:Illogical; Obsessions  History of Schizophrenia/Schizoaffective disorder:No  Duration of Psychotic Symptoms:No data recorded Hallucinations:Hallucinations: None  Ideas of Reference:None  Suicidal Thoughts:Suicidal Thoughts: Yes, Active SI Active Intent and/or Plan: With Means to Carry Out; With Access to Means; With Intent; With Plan  Homicidal Thoughts:Homicidal Thoughts: Yes, Active HI Active Intent and/or Plan: With Intent; With Plan; With Means to [place]; With Access to [place]; Recent Fair; Remote Fair  Judgment:Fair  Insight:Fair   Executive Functions  Concentration:Fair  Attention Span:Fair  [place]   Psychomotor Activity  Psychomotor Activity:Psychomotor Activity: Normal   Assets  Assets:Housing; Financial Resources/Insurance   Sleep  Sleep:Sleep: Good    Physical Exam: Physical Exam Vitals and nursing note reviewed.  HENT:     Head: Normocephalic.     Nose: Nose normal.     Mouth/Throat:     Mouth: Mucous membranes are moist.     Pharynx: Oropharynx is clear.  Eyes:     Pupils: Pupils are equal, round, and reactive to light.  Cardiovascular:     Rate and Rhythm: Normal rate.     Pulses: Normal pulses.  Pulmonary:     Effort: Pulmonary effort is normal.  Musculoskeletal:        General: Normal range of motion.     Cervical back: Normal range of motion.  Skin:    General: Skin is warm and dry.  Neurological:     General: No focal deficit present.     Mental Status: He is alert and oriented to person, place, and time. Mental status is at baseline.  Psychiatric:        Attention and Perception: Attention and perception normal.        Mood and Affect: Mood is depressed. Affect is blunt and angry.        Speech: Speech normal.        Behavior: Behavior is cooperative.         Thought Content: Thought content includes homicidal and suicidal ideation. Thought content includes homicidal and suicidal plan.        Cognition and Memory: Cognition and memory normal.        Judgment: Judgment normal.   Review of Systems  Psychiatric/Behavioral:  Positive for depression, substance abuse and suicidal ideas.   All other systems reviewed and are negative. Blood pressure 135/85, pulse 82, temperature 98.4 F (36.9 C), temperature source Oral, resp. rate 18, SpO2 99 %. There is no height or weight on file to calculate BMI.  Treatment Plan Summary: Daily contact with patient to assess and evaluate symptoms and progress in treatment, Medication management, and Plan seek inpatient psychiatric hospitalization for further observation, stabilization, and treatment. Patient currently under review via [place].   Inda Merlin, NP 12/06/2020, 11:14 AM

## 2020-12-06 NOTE — Progress Notes (Signed)
Per Suzzanne Cloud, via secure chat patient meets criteria for inpatient treatment. CSW contacted the [facility] to inquire about beds, it was reported that the [facility] currently has available beds. CSW assisted with completion of [facility] referral form and faxed referral for review at the [facility].   TTS will continue to seek bed placement.   Glennie Isle, MSW, [place], LCAS-A Phone: [phone] Disposition/TOC

## 2020-12-06 NOTE — ED Notes (Signed)
Pt has been pleasant, calm, and cooperative since arriving in room 27.

## 2020-12-06 NOTE — ED Notes (Signed)
Pt  alert x 4 , calm and cooperative.States that he is looking forward  to being d/c to the [place] when a bed is available, took all of his evening meds at this time. I will continue to monitor.

## 2020-12-07 NOTE — ED Provider Notes (Signed)
Emergency Medicine Observation Re-evaluation Note  [name] is a 65 y.o. male, seen on rounds today.  Pt initially presented to the ED for complaints of suicidal thoughts. Currently calm, alert, nad. [place] team is recommending inpatient psych tx.   Physical Exam  BP (!) 119/57 (BP Location: Left Arm)   Pulse 85   Temp 98.7 F (37.1 C) (Oral)   Resp 14   SpO2 98%  Physical Exam General: calm, nad Cardiac: regular rate Lungs: breathing comfortably Psych: alert, calm, cooperative. Depressed mood.   ED Course / MDM   I have reviewed the labs performed to date as well as medications administered while in observation.  Recent changes in the last 24 hours include ED observation and BH reassessment.   Plan  [place] team is reassessing and continuing to manage [name] meds - currently recommending and attempting to locate inpatient psych treatment.       Kevin Saver, MD 12/07/20 1151

## 2020-12-07 NOTE — ED Notes (Signed)
Pt alert and oriented. Pt calm and cooperative. Pt appropriate with staff. Pt endorsed suicidal thoughts with a plan, use a gun. Pt endorsed homicidal thought towards person that is threatening him.   Medication complete this shift.  Pt able to complete ADLs

## 2020-12-08 ENCOUNTER — Inpatient Hospital Stay (HOSPITAL_COMMUNITY)
Admission: AD | Admit: 2020-12-08 | Discharge: 2020-12-16 | DRG: 885 | Disposition: A | Discharge status: Home / Self Care | Payer: Medicaid Other | Source: Intra-hospital | Attending: Psychiatry | Admitting: Psychiatry

## 2020-12-08 DIAGNOSIS — F1510 Other stimulant abuse, uncomplicated: Secondary | ICD-10-CM | POA: Diagnosis present

## 2020-12-08 DIAGNOSIS — Z9151 Personal history of suicidal behavior: Secondary | ICD-10-CM | POA: Diagnosis not present

## 2020-12-08 DIAGNOSIS — I5020 Unspecified systolic (congestive) heart failure: Secondary | ICD-10-CM | POA: Diagnosis present

## 2020-12-08 DIAGNOSIS — R45851 Suicidal ideations: Secondary | ICD-10-CM | POA: Diagnosis present

## 2020-12-08 DIAGNOSIS — F332 Major depressive disorder, recurrent severe without psychotic features: Secondary | ICD-10-CM | POA: Diagnosis not present

## 2020-12-08 DIAGNOSIS — Z7982 Long term (current) use of aspirin: Secondary | ICD-10-CM

## 2020-12-08 DIAGNOSIS — F1010 Alcohol abuse, uncomplicated: Secondary | ICD-10-CM | POA: Diagnosis present

## 2020-12-08 DIAGNOSIS — Z818 Family history of other mental and behavioral disorders: Secondary | ICD-10-CM

## 2020-12-08 DIAGNOSIS — F3130 Bipolar disorder, current episode depressed, mild or moderate severity, unspecified: Principal | ICD-10-CM

## 2020-12-08 DIAGNOSIS — Z955 Presence of coronary angioplasty implant and graft: Secondary | ICD-10-CM

## 2020-12-08 DIAGNOSIS — Z7902 Long term (current) use of antithrombotics/antiplatelets: Secondary | ICD-10-CM | POA: Diagnosis not present

## 2020-12-08 DIAGNOSIS — R0602 Shortness of breath: Secondary | ICD-10-CM | POA: Diagnosis not present

## 2020-12-08 DIAGNOSIS — R059 Cough, unspecified: Secondary | ICD-10-CM | POA: Diagnosis not present

## 2020-12-08 DIAGNOSIS — F17210 Nicotine dependence, cigarettes, uncomplicated: Secondary | ICD-10-CM | POA: Diagnosis present

## 2020-12-08 DIAGNOSIS — E119 Type 2 diabetes mellitus without complications: Secondary | ICD-10-CM | POA: Diagnosis present

## 2020-12-08 DIAGNOSIS — F314 Bipolar disorder, current episode depressed, severe, without psychotic features: Secondary | ICD-10-CM

## 2020-12-08 DIAGNOSIS — E785 Hyperlipidemia, unspecified: Secondary | ICD-10-CM | POA: Diagnosis present

## 2020-12-08 DIAGNOSIS — M170 Bilateral primary osteoarthritis of knee: Secondary | ICD-10-CM | POA: Diagnosis present

## 2020-12-08 DIAGNOSIS — I2510 Atherosclerotic heart disease of native coronary artery without angina pectoris: Secondary | ICD-10-CM | POA: Diagnosis present

## 2020-12-08 DIAGNOSIS — Z20822 Contact with and (suspected) exposure to covid-19: Secondary | ICD-10-CM | POA: Diagnosis present

## 2020-12-08 DIAGNOSIS — F319 Bipolar disorder, unspecified: Secondary | ICD-10-CM | POA: Diagnosis not present

## 2020-12-08 DIAGNOSIS — Z720 Tobacco use: Secondary | ICD-10-CM | POA: Diagnosis not present

## 2020-12-08 DIAGNOSIS — F10232 Alcohol dependence with withdrawal with perceptual disturbance: Secondary | ICD-10-CM | POA: Diagnosis present

## 2020-12-08 DIAGNOSIS — F329 Major depressive disorder, single episode, unspecified: Secondary | ICD-10-CM | POA: Diagnosis present

## 2020-12-08 DIAGNOSIS — I509 Heart failure, unspecified: Secondary | ICD-10-CM | POA: Diagnosis present

## 2020-12-08 DIAGNOSIS — F1410 Cocaine abuse, uncomplicated: Secondary | ICD-10-CM

## 2020-12-08 DIAGNOSIS — F17200 Nicotine dependence, unspecified, uncomplicated: Secondary | ICD-10-CM | POA: Diagnosis present

## 2020-12-08 LAB — RESP PANEL BY RT-PCR (FLU A&B, COVID) ARPGX2
Influenza A by PCR: NEGATIVE
Influenza B by PCR: NEGATIVE
SARS Coronavirus 2 by RT PCR: NEGATIVE

## 2020-12-08 LAB — CBG MONITORING, ED: Glucose-Capillary: 107 mg/dL — ABNORMAL HIGH (ref 70–99)

## 2020-12-08 NOTE — BH Assessment (Addendum)
[place] Assessment Progress Note   Per Sheran Fava, NP, this pt requires psychiatric hospitalization at this time.  [place], RN, [facility] has assigned pt to [facility] Rm 405-2 to the service of Dr Caswell Corwin.  [facility] will not, however, be ready to receive pt until next shift.  AC will call to coordinate.  Pt has signed Voluntary Admission and Consent for Treatment, as well as Consent to Release Information to the [facility], and a notification call has been placed.  Signed forms have been faxed to [facility].  EDP [name], MD and pt's nurse, Tilda Franco, have been notified, and Tilda Franco agrees to send original paperwork along with pt via [facility], and to call report to [phone].  At 08:59 this Probation officer spoke to April at the [facility].  She reports that her facility is currently at capacity, and that the [place], [place] and [place] are all currently on mental health diversion.  Jalene Mullet, [place] Coordinator [phone]

## 2020-12-08 NOTE — ED Notes (Signed)
Patient calm and cooperative this shift.

## 2020-12-08 NOTE — ED Provider Notes (Signed)
Emergency Medicine Observation Re-evaluation Note  [name] is a 65 y.o. male, seen on rounds today.  Pt initially presented to the ED for complaints of Suicidal Currently, the patient is nothjing.  Physical Exam  BP (!) 145/86 (BP Location: Left Arm)   Pulse 87   Temp 98.1 F (36.7 C) (Oral)   Resp 18   SpO2 98%  Physical Exam General: awake Cardiac: regular Lungs: clear Psych: calm  ED Course / MDM  EKG:   I have reviewed the labs performed to date as well as medications administered while in observation.  Recent changes in the last 24 hours include nothing.  Plan  Current plan is for inpt.      Lennice Sites, DO 12/08/20 [phone]

## 2020-12-08 NOTE — ED Notes (Signed)
Report given to [facility]. Patient going by safe transport.
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
## 2020-12-09 ENCOUNTER — Encounter (HOSPITAL_COMMUNITY): Payer: Self-pay | Admitting: Psychiatry

## 2020-12-09 ENCOUNTER — Other Ambulatory Visit: Payer: Self-pay

## 2020-12-09 DIAGNOSIS — F1010 Alcohol abuse, uncomplicated: Secondary | ICD-10-CM | POA: Diagnosis present

## 2020-12-09 DIAGNOSIS — F3130 Bipolar disorder, current episode depressed, mild or moderate severity, unspecified: Secondary | ICD-10-CM

## 2020-12-09 DIAGNOSIS — F10232 Alcohol dependence with withdrawal with perceptual disturbance: Secondary | ICD-10-CM | POA: Diagnosis present

## 2020-12-09 DIAGNOSIS — F329 Major depressive disorder, single episode, unspecified: Secondary | ICD-10-CM | POA: Diagnosis present

## 2020-12-09 LAB — GLUCOSE, CAPILLARY
Glucose-Capillary: 172 mg/dL — ABNORMAL HIGH (ref 70–99)
Glucose-Capillary: 191 mg/dL — ABNORMAL HIGH (ref 70–99)

## 2020-12-09 MED ORDER — ACETAMINOPHEN 325 MG PO TABS: 650.0000 mg | ORAL_TABLET | Freq: Four times a day (QID) | ORAL | Status: DC | PRN

## 2020-12-09 MED ORDER — ONDANSETRON 4 MG PO TBDP: 4.0000 mg | ORAL_TABLET | Freq: Four times a day (QID) | ORAL | Status: AC | PRN

## 2020-12-09 MED ORDER — MAGNESIUM HYDROXIDE 400 MG/5ML PO SUSP: 30.0000 mL | Freq: Every day | ORAL | Status: DC | PRN

## 2020-12-09 MED ORDER — INSULIN ASPART 100 UNIT/ML IJ SOLN
0.0000 [IU] | Freq: Three times a day (TID) | INTRAMUSCULAR | Status: DC
  Administered 2020-12-15: 2 [IU] via SUBCUTANEOUS

## 2020-12-09 MED ORDER — METOPROLOL SUCCINATE ER 25 MG PO TB24
25.0000 mg | ORAL_TABLET | Freq: Every day | ORAL | Status: DC
Start: 2020-12-09 — End: 2020-12-16
  Administered 2020-12-09 – 2020-12-16 (×8): 25 mg via ORAL
  Filled 2020-12-09 (×10): qty 1

## 2020-12-09 MED ORDER — ROSUVASTATIN CALCIUM 20 MG PO TABS
40.0000 mg | ORAL_TABLET | Freq: Every day | ORAL | Status: DC
  Administered 2020-12-10 – 2020-12-16 (×7): 40 mg via ORAL
  Filled 2020-12-09 (×9): qty 2

## 2020-12-09 MED ORDER — ADULT MULTIVITAMIN W/MINERALS CH
1.0000 | ORAL_TABLET | Freq: Every day | ORAL | Status: DC
  Filled 2020-12-09 (×3): qty 1

## 2020-12-09 MED ORDER — METFORMIN HCL ER 500 MG PO TB24
500.0000 mg | ORAL_TABLET | Freq: Every day | ORAL | Status: DC
Start: 2020-12-09 — End: 2020-12-16
  Administered 2020-12-09 – 2020-12-16 (×8): 500 mg via ORAL
  Filled 2020-12-09 (×9): qty 1

## 2020-12-09 MED ORDER — NICOTINE 14 MG/24HR TD PT24
14.0000 mg | MEDICATED_PATCH | Freq: Every day | TRANSDERMAL | Status: DC
  Filled 2020-12-09 (×2): qty 1

## 2020-12-09 MED ORDER — ALUM & MAG HYDROXIDE-SIMETH 200-200-20 MG/5ML PO SUSP: 30.0000 mL | ORAL | Status: DC | PRN

## 2020-12-09 MED ORDER — LOPERAMIDE HCL 2 MG PO CAPS
2.0000 mg | ORAL_CAPSULE | ORAL | Status: AC | PRN
Start: 2020-12-09 — End: 2020-12-12

## 2020-12-09 MED ORDER — FUROSEMIDE 20 MG PO TABS: 20.0000 mg | ORAL_TABLET | Freq: Every day | ORAL | Status: DC | PRN

## 2020-12-09 MED ORDER — ASPIRIN EC 81 MG PO TBEC
81.0000 mg | DELAYED_RELEASE_TABLET | Freq: Every day | ORAL | Status: DC
  Administered 2020-12-09 – 2020-12-16 (×8): 81 mg via ORAL
  Filled 2020-12-09 (×9): qty 1

## 2020-12-09 MED ORDER — ENSURE ENLIVE PO LIQD
237.0000 mL | Freq: Two times a day (BID) | ORAL | Status: DC
  Filled 2020-12-09 (×3): qty 237

## 2020-12-09 MED ORDER — NICOTINE POLACRILEX 2 MG MT GUM: 2.0000 mg | CHEWING_GUM | OROMUCOSAL | Status: DC | PRN

## 2020-12-09 MED ORDER — DULOXETINE HCL 20 MG PO CPEP
20.0000 mg | ORAL_CAPSULE | Freq: Every day | ORAL | Status: DC
  Administered 2020-12-09 – 2020-12-16 (×8): 20 mg via ORAL
  Filled 2020-12-09 (×9): qty 1

## 2020-12-09 MED ORDER — LAMOTRIGINE 25 MG PO TABS
25.0000 mg | ORAL_TABLET | Freq: Every day | ORAL | Status: DC
  Administered 2020-12-10 – 2020-12-13 (×4): 25 mg via ORAL
  Filled 2020-12-09 (×7): qty 1

## 2020-12-09 MED ORDER — LORAZEPAM 1 MG PO TABS: 1.0000 mg | ORAL_TABLET | Freq: Four times a day (QID) | ORAL | Status: AC | PRN

## 2020-12-09 MED ORDER — PANTOPRAZOLE SODIUM 40 MG PO TBEC
40.0000 mg | DELAYED_RELEASE_TABLET | Freq: Every day | ORAL | Status: DC
Start: 2020-12-09 — End: 2020-12-16
  Administered 2020-12-09 – 2020-12-16 (×8): 40 mg via ORAL
  Filled 2020-12-09 (×9): qty 1

## 2020-12-09 MED ORDER — LAMOTRIGINE 100 MG PO TABS
100.0000 mg | ORAL_TABLET | Freq: Two times a day (BID) | ORAL | Status: DC
  Administered 2020-12-09: 100 mg via ORAL
  Filled 2020-12-09 (×5): qty 1

## 2020-12-09 MED ORDER — HYDROXYZINE HCL 25 MG PO TABS: 25.0000 mg | ORAL_TABLET | Freq: Four times a day (QID) | ORAL | Status: AC | PRN

## 2020-12-09 MED ORDER — THIAMINE HCL 100 MG PO TABS
100.0000 mg | ORAL_TABLET | Freq: Every day | ORAL | Status: DC
  Administered 2020-12-10 – 2020-12-16 (×7): 100 mg via ORAL
  Filled 2020-12-09 (×8): qty 1

## 2020-12-09 MED ORDER — GLUCERNA SHAKE PO LIQD
237.0000 mL | Freq: Three times a day (TID) | ORAL | Status: DC
  Administered 2020-12-12 – 2020-12-15 (×7): 237 mL via ORAL
  Filled 2020-12-09 (×25): qty 237

## 2020-12-09 MED ORDER — METFORMIN HCL ER 500 MG PO TB24
1000.0000 mg | ORAL_TABLET | Freq: Every day | ORAL | Status: DC
  Administered 2020-12-09 – 2020-12-15 (×7): 1000 mg via ORAL
  Filled 2020-12-09 (×8): qty 2

## 2020-12-09 MED ORDER — GABAPENTIN 300 MG PO CAPS: 600.0000 mg | ORAL_CAPSULE | Freq: Every day | ORAL | Status: DC | PRN

## 2020-12-09 MED ORDER — TRAZODONE HCL 50 MG PO TABS
50.0000 mg | ORAL_TABLET | Freq: Every evening | ORAL | Status: DC | PRN
  Administered 2020-12-09 – 2020-12-15 (×6): 50 mg via ORAL
  Filled 2020-12-09 (×7): qty 1

## 2020-12-09 MED ORDER — ADULT MULTIVITAMIN W/MINERALS CH
1.0000 | ORAL_TABLET | Freq: Every day | ORAL | Status: DC
  Administered 2020-12-09 – 2020-12-16 (×8): 1 via ORAL
  Filled 2020-12-09 (×9): qty 1

## 2020-12-09 MED ORDER — THIAMINE HCL 100 MG PO TABS
100.0000 mg | ORAL_TABLET | Freq: Every day | ORAL | Status: DC
Start: 2020-12-09 — End: 2020-12-09
  Administered 2020-12-09: 100 mg via ORAL
  Filled 2020-12-09 (×3): qty 1

## 2020-12-09 MED ORDER — CLOPIDOGREL BISULFATE 75 MG PO TABS
75.0000 mg | ORAL_TABLET | Freq: Every day | ORAL | Status: DC
Start: 2020-12-09 — End: 2020-12-16
  Administered 2020-12-09 – 2020-12-16 (×8): 75 mg via ORAL
  Filled 2020-12-09 (×9): qty 1

## 2020-12-09 NOTE — Progress Notes (Signed)
Patient is minimal and guarded stated he still feel the same he felt yesterday when he was admitted. Rated anxiety 5/10 and depression 6/10, Refused any prn anxiety medicine. Requested for trazodone for sleep. He has not been attending groups he is isolating in his room. Refused his EKG this evening stated he will do it in the morning. Support and encouragement provided as needed. Endorses Passive SI and verbally contracted for safety.

## 2020-12-09 NOTE — Progress Notes (Signed)
NUTRITION ASSESSMENT RD working remotely.  Pt identified as at risk on the Malnutrition Screen Tool  INTERVENTION: - continue Glucerna Shake TID, each supplement provides 220 kcal and 10 grams of protein. - will order 1 tablet multivitamin with minerals/day. [facility] staff to continue to encourage PO intakes of meals, supplements, and snacks.    NUTRITION DIAGNOSIS: Unintentional weight loss related to sub-optimal intake as evidenced by pt report.   Goal: Pt to meet >/= 90% of their estimated nutrition needs.  Monitor:  PO intake  Assessment:  Patient admitted due to worsening depression, SI, and HI toward people from whom he buys cocaine.   Weight yesterday was 162 lb and weight on 10/07/20 was 195 lb. This indicates 33 lb weight loss (17% body weight) in the past 2 months.  Patient reported using ~$50 of cocaine/day and drinking 3-4 beers/day.   He is noted to have PMH which includes DM. Unsure of glycemic control PTA.   65 y.o. male  Height: Ht Readings from Last 1 Encounters:  12/08/20 5\' 10"  (1.778 m)    Weight: Wt Readings from Last 1 Encounters:  12/08/20 73.5 kg    Weight Hx: Wt Readings from Last 10 Encounters:  12/08/20 73.5 kg  10/10/20 88.5 kg  10/07/20 88.7 kg  08/29/20 105 kg  01/25/20 75.7 kg  12/30/16 96.2 kg  12/16/16 96.5 kg  10/09/16 100.7 kg  05/07/16 97.1 kg  01/01/15 98 kg    BMI:  Body mass index is 23.24 kg/m. Pt meets criteria for normal weight based on current BMI.  Estimated Nutritional Needs: Kcal: 25-30 kcal/kg Protein: > 1 gram protein/kg Fluid: 1 ml/kcal  Diet Order:  Diet Order             Diet Carb Modified Fluid consistency: Thin; Room service appropriate? Yes  Diet effective now                  Pt is also offered choice of unit snacks mid-morning and mid-afternoon.  Pt is eating as desired.   Lab results and medications reviewed.      Jarome Matin, MS, RD, LDN, CNSC Inpatient Clinical  Dietitian RD pager # available in [place]  After hours/weekend pager # available in [facility]
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
## 2020-12-09 NOTE — Progress Notes (Signed)
Psychoeducational Group Note  Date:  12/09/2020 Time:  2227  Group Topic/Focus:  Wrap-Up Group:   The focus of this group is to help patients review their daily goal of treatment and discuss progress on daily workbooks.  Participation Level: Did Not Attend  Participation Quality:  Not Applicable  Affect:  Not Applicable  Cognitive:  Not Applicable  Insight:  Not Applicable  Engagement in Group: Not Applicable  Additional Comments:  Patient did not come to group this evening.   Archie Balboa S 12/09/2020, 10:27 PM

## 2020-12-09 NOTE — Progress Notes (Signed)
   12/09/20 1600  Psych Admission Type (Psych Patients Only)  Admission Status Voluntary  Psychosocial Assessment  Patient Complaints Substance abuse  Eye Contact Fair  Facial Expression Flat  Affect Depressed  Speech Logical/coherent;Soft  Interaction Assertive  Motor Activity Slow  Appearance/Hygiene Poor hygiene  Behavior Characteristics Cooperative  Mood Depressed  Thought Process  Coherency WDL  Content Blaming others  Delusions WDL  Hallucination None reported or observed  Judgment Poor  Confusion None  Danger to Self  Current suicidal ideation? Passive  Self-Injurious Behavior No self-injurious ideation or behavior indicators observed or expressed   Agreement Not to Harm Self Yes (Verbally)  Description of Agreement Verbal  Danger to Others  Danger to Others None reported or observed

## 2020-12-09 NOTE — BHH Group Notes (Signed)
Patient did not attend the Psycho-Ed group. 

## 2020-12-09 NOTE — Tx Team (Signed)
Initial Treatment Plan 12/09/2020 1:43 AM [name] SLP:[id number]    PATIENT STRESSORS: Financial difficulties   Substance abuse     PATIENT STRENGTHS: Ability for insight  General fund of knowledge  Motivation for treatment/growth    PATIENT IDENTIFIED PROBLEMS: " I buy $100 of cocaine every other day"  "I want to kill myself or the People who want to kill me"  " I have a gun hidden somewhere"  "I cannot return to my house"  Depression             DISCHARGE CRITERIA:  Ability to meet basic life and health needs Improved stabilization in mood, thinking, and/or behavior Motivation to continue treatment in a less acute level of care Need for constant or close observation no longer present Reduction of life-threatening or endangering symptoms to within safe limits Verbal commitment to aftercare and medication compliance  PRELIMINARY DISCHARGE PLAN: Outpatient therapy Placement in alternative living arrangements  PATIENT/FAMILY INVOLVEMENT: This treatment plan has been presented to and reviewed with the patient, [name].  The patient and family have been given the opportunity to ask questions and make suggestions.  Wylie Hail, RN 12/09/2020, 1:43 AM

## 2020-12-09 NOTE — Group Note (Signed)
Group Topic: Goal Setting  Group Date: 12/09/2020 Start Time: 0830 End Time: 0900 Facilitators: Loney Loh, NT  Department: [facility] INPATIENT ADULT 400B  Number of Participants: 11  Group Focus: goals/reality orientation  Name: [name] Date of Birth: 09/20/1955  MR: [id number]    Level of Participation: Pt did attend goals group.
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
## 2020-12-10 ENCOUNTER — Encounter (HOSPITAL_COMMUNITY): Payer: Self-pay

## 2020-12-10 DIAGNOSIS — F3130 Bipolar disorder, current episode depressed, mild or moderate severity, unspecified: Secondary | ICD-10-CM | POA: Diagnosis not present

## 2020-12-10 LAB — HEMOGLOBIN A1C
Hgb A1c MFr Bld: 6.8 % — ABNORMAL HIGH (ref 4.8–5.6)
Mean Plasma Glucose: 148.46 mg/dL

## 2020-12-10 LAB — GLUCOSE, CAPILLARY
Glucose-Capillary: 118 mg/dL — ABNORMAL HIGH (ref 70–99)
Glucose-Capillary: 136 mg/dL — ABNORMAL HIGH (ref 70–99)
Glucose-Capillary: 125 mg/dL — ABNORMAL HIGH (ref 70–99)

## 2020-12-10 LAB — TSH: TSH: 0.977 u[IU]/mL (ref 0.350–4.500)

## 2020-12-10 LAB — LIPID PANEL
Cholesterol: 177 mg/dL (ref 0–200)
HDL: 50 mg/dL (ref >40)
LDL Cholesterol: 102 mg/dL — ABNORMAL HIGH (ref 0–99)
Total CHOL/HDL Ratio: 3.5 RATIO
Triglycerides: 124 mg/dL (ref <150)
VLDL: 25 mg/dL (ref 0–40)

## 2020-12-10 MED ORDER — NICOTINE 14 MG/24HR TD PT24: 14.0000 mg | MEDICATED_PATCH | Freq: Every day | TRANSDERMAL | Status: DC

## 2020-12-10 MED ORDER — DM-GUAIFENESIN ER 30-600 MG PO TB12
1.0000 | ORAL_TABLET | Freq: Two times a day (BID) | ORAL | Status: DC | PRN
  Administered 2020-12-10: 1 via ORAL
  Filled 2020-12-10: qty 1

## 2020-12-10 MED ORDER — GABAPENTIN 100 MG PO CAPS
100.0000 mg | ORAL_CAPSULE | Freq: Three times a day (TID) | ORAL | Status: DC
  Administered 2020-12-10 – 2020-12-11 (×2): 100 mg via ORAL
  Filled 2020-12-10 (×5): qty 1

## 2020-12-10 MED ORDER — NICOTINE 14 MG/24HR TD PT24: 14.0000 mg | MEDICATED_PATCH | Freq: Every day | TRANSDERMAL | Status: DC | PRN

## 2020-12-10 MED ORDER — ARIPIPRAZOLE 5 MG PO TABS
5.0000 mg | ORAL_TABLET | Freq: Every day | ORAL | Status: DC
  Administered 2020-12-10 – 2020-12-16 (×7): 5 mg via ORAL
  Filled 2020-12-10 (×8): qty 1

## 2020-12-10 MED ORDER — BENZONATATE 100 MG PO CAPS: 100.0000 mg | ORAL_CAPSULE | Freq: Three times a day (TID) | ORAL | Status: DC | PRN

## 2020-12-10 NOTE — Group Note (Signed)
LCSW Group Therapy Note   Group Date: 12/10/2020 Start Time: 1300 End Time: 1330   Type of Therapy and Topic:  Group Therapy:   Therapy Type: Group Therapy  Participation Level:  Active   Patients received a worksheet with an outline of 2 gingerbread men with a separation in the middle of the page. One sign designated what the pt sees about themselves and the other is what others see. Pts were asked to introduce themselves and share something they like about themself. Pts were then asked to draw, write or color how they view themselves as well as how they are viewed by others. CSW led discussion about the feelings and words associated with each side.   Patient Summary:   During introductions pt shared their name and stated they liked his positive attitude about themselves. Pt was appropriate and participated in group discussion.     Mliss Fritz, LCSWA 12/10/2020  1:37 PM

## 2020-12-10 NOTE — Progress Notes (Addendum)
[facility] Progress Note   12/10/2020 12:26 PM [name]  MRN:  [id number]   Subjective: [name] reports, "I'm feeling okay. I still have feelings about hurting other people before they hurt me. But I think about it, and I don't want to live the rest of my life in jail for murder."   Daily notes: [name] was seen in the presence of Dr. Nelda Marseille and chart was reviewed. The chart findings were discussed with the treatment team. He presents alert & oriented x 4. He is visible on unit and attending group sessions today. He said he did not want to participate yesterday, but he feels more comfortable today and is "enjoying being around nice people". He said he is glad to be around people who do not want to borrow or take money from him. The group session challenged him to think more positively.  He said he still has feelings about hurting himself that are passive today but denies current intent or plan and can contract for safety on the unit. He denies current HI intent or plan. When questioned in more detail about the firearm he had access to prior to admission, he changes his initial story.  He now says his close friend let him borrow the gun, but he talked to the friend on the phone before coming to the hospital,told him where the gun was located, and he says his friend retrieved it. The patient said he has a few good people around him who can help him make sound decisions and he realizes he does not want to hurt others.  The patient was given information about [facility] in [place] for potential residential rehab. He is hesitant to consider moving so far away, but he also recognizes how important it will be for him to relocate. He said he is sure the woman is still out to get him and is riding around looking for him currently. He said before coming to the hospital, there were times when he had to hide behind buildings and had to duck into stores to avoid her. He said he does not want to live  that way anymore and "wants a peaceful life". He denies current cravings for drugs or alcohol and is open to residential rehab referrals after discharge.He initially was concerned when we discussed that this hospitalization is a short-term stay facility, and he was encouraged to think about residential rehab options as a transition after discharge.    He is taking and tolerating his current medications.  We discussed that according to his pharmacy records he was previously on Abilify 5mg  daily and Neurontin PRN for pain. He was willing to restart Abilify for help with mood stabilization given his bipolar diagnosis, especially while on an antidepressant to avoid potential mood escalation. We discussed use of Neurontin 100mg  tid titrating up as tolerated to help with anxiety and pain and he agrees with this plan. He has been sleeping "pretty well". He is also eating well, but he believes he enjoys the food too much. We discussed making healthy food choices given his DM history and he is willing to start SSI as needed during admission for any elevated glucose readings.  He states that prior to admission could not afford Entresto, but it appears this  was discontinued according to his hospital notes. He was written for jardiance given his CHF history but states he could not afford it and never got it filled. He denies side effects of medications he is currently taking. Today,  he has a new cough and said he feels a "cold in his chest", so he requested Mucinex. Yesterday afternoon, he was experiencing abdominal pain that was so severe that he had to get back in bed, but he said he had a bowel movement and the pain has resolved.  He denied AVH, ideas of reference, first rank symptoms, or current paranoia on the unit. He does not appear to be responding to any internal stimuli. He is in agreement to continue current plan of care as already in progress.    Principal Problem: Bipolar d/o current episode depressed    Diagnosis: Active Problems: Bipolar d/o current episode depressed DM type II CHF Tobacco use d/o CAD s/p stent Cocaine abuse Alcohol abuse Hyperlipidemia   Total Time Spent in Direct Patient Care:  I personally spent 30 minutes on the unit in direct patient care. The direct patient care time included face-to-face time with the patient, reviewing the patient's chart, communicating with other professionals, and coordinating care. Greater than 50% of this time was spent in counseling or coordinating care with the patient regarding goals of hospitalization, psycho-education, and discharge planning needs.  Past Psychiatric History: Patient was told he had bipolar disorder in 1991 but at one point was told he could have PTSD or schizophrenia. He has had psychiatric hospitalizations at [facility] but does not recall dates of treatments. He currently receives psychiatric care through the [facility]. Patient says he started receiving care at the [facility] in the 1990s. He started in [place], [place], but has also received care in [place], other cities in [place], and various cities in [place] and [place]. According to EHR he previously reported suicide attempts via jumping into traffic and jumping from a moving vehicle. He has been on various medications in the past including Zoloft, Prozac, and Haldol. Per records, he has also previously been on Neurontin, Abilify, Lamictal, and Cymbalta.  Past Medical History:        Past Medical History:  Diagnosis Date   Anxiety     Arthritis     Cancer ([place])     Cataract     Depression     Diabetes mellitus     GERD (gastroesophageal reflux disease)     H/O suicide attempt 1997 and 2011    jumped out of moving vehicle and attempted to jump into moving traffic   High cholesterol     Hypertension     Neuropathic pain     Osteoporosis     CAD s/p stent CHF s/p paracentesis        Past Surgical History:  Procedure Laterality Date   COLONOSCOPY        CORONARY STENT INTERVENTION N/A 10/10/2020    Procedure: CORONARY STENT INTERVENTION;  Surgeon: Jettie Booze, MD;  Location: [facility];  Service: Cardiovascular;  Laterality: N/A;   LEFT HEART CATH AND CORONARY ANGIOGRAPHY N/A 10/10/2020    Procedure: LEFT HEART CATH AND CORONARY ANGIOGRAPHY;  Surgeon: Jettie Booze, MD;  Location: [facility];  Service: Cardiovascular;  Laterality: N/A;   NEPHRECTOMY        due to cancer   RIGHT/LEFT HEART CATH AND CORONARY ANGIOGRAPHY N/A 10/09/2020    Procedure: RIGHT/LEFT HEART CATH AND CORONARY ANGIOGRAPHY;  Surgeon: Nelva Bush, MD;  Location: [facility];  Service: Cardiovascular;  Laterality: N/A;    Family History:  Patient says his mother and sister have had "bad leg swelling" due to fluid buildup. He said  they ate a lot of soul food together.        Family History  Problem Relation Age of Onset   Alcohol abuse Sister     Alcohol abuse Brother     Colon cancer Neg Hx     Esophageal cancer Neg Hx     Pancreatic cancer Neg Hx     Rectal cancer Neg Hx     Stomach cancer Neg Hx      Family Psychiatric History: Maternal aunt had schizophrenia and committed suicide. Patient has a younger sister that had mental health problems and has been addicted to pills and cocaine.   Tobacco Screening:  Patient has smoked 1/2 pack per day since he was 65 years old. 23.5 pack-year smoking history.   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 Noncombat service - After serving, he was homeless for 30 years. 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 He denies legal charges.    Marital status: Single, never married Are you sexually active?: No What is your sexual orientation?: Straight Has your sexual activity been affected by drugs, alcohol, medication, or emotional stress?: No Does patient have children?: No   Allergies:       Allergies  Allergen Reactions   Morphine And Related Other (See Comments)      Pt unable to take narcotics.   Patient is not actually allergic to morphine and narcotics. He prefers to avoid them when possible due to his substance use history.   Medications:  Scheduled Meds:  aspirin EC  81 mg Oral Daily   clopidogrel  75 mg Oral Daily   DULoxetine  20 mg Oral Daily   feeding supplement (GLUCERNA SHAKE)  237 mL Oral TID BM   insulin aspart  0-9 Units Subcutaneous TID WC   lamoTRIgine  25 mg Oral Daily   metFORMIN  1,000 mg Oral Q supper   metFORMIN  500 mg Oral Q breakfast   metoprolol succinate  25 mg Oral Daily   multivitamin with minerals  1 tablet Oral Daily   pantoprazole  40 mg Oral Daily   rosuvastatin  40 mg Oral Daily   thiamine  100 mg Oral Daily   Continuous Infusions: None PRN Meds:.acetaminophen, alum & mag hydroxide-simeth, hydrOXYzine, loperamide, LORazepam, magnesium hydroxide, nicotine polacrilex, ondansetron, traZODone    Physical Findings:   Presentation  General Appearance: Appropriate for Environment; Casual; Fairly Groomed   Eye Contact:Good   Speech:Normal Rate; Clear and Coherent   Speech Volume:Normal   Handedness:Right   Mood and Affect  Mood:-- ("I'm feeling okay.") - appears mildly anxious when discussing treatment planning and discharge options   Affect:polite, mildly anxious appearing   Thought Process  Thought Processes:Coherent; Goal Directed; Linear - less circumstantial today   Descriptions of Associations:Intact   Orientation:Full (Time, Place and Person)   Thought Content:Patient believes a male  friend is still searching for him. Unsure if this is true or delusional; denies AVH, current paranoia, ideas of  reference, or first rank symptoms - is not grossly responding to internal/external stimuli on exam   History of Schizophrenia/Schizoaffective disorder:No, patient says he was misdiagnosed previously.   Duration of Psychotic Symptoms: Greater than 2 weeks.   Hallucinations: None   Ideas of Reference: None   Suicidal Thoughts: Endorsed. Patient said he still has feelings about hurting himself and believes it will take a long time for him to stop having these feelings. Has no active plan and can contract for safety on the unit.   Homicidal Thoughts: Denied HI intent or plan today   Sensorium  Memory:Immediate Good; Recent Good; Remote Fair   Judgment:Fair   Insight:Fair   Executive Functions  Concentration:Good   Attention Span:Good   [place]   Psychomotor Activity:Normal   Assets:Desire for Improvement; Armed forces logistics/support/administrative officer; Leisure Time; Resilience; Vocational/Educational; A Few Friends Who Give Sound Advice  Sleep: Patient said he sleeps pretty well.   Physical Exam Vitals reviewed.  Constitutional:      General: He is not in acute distress.    Appearance: Normal appearance.  HENT:     Head: Normocephalic and atraumatic.  Pulmonary:     Effort: Pulmonary effort is normal. No respiratory distress.  Skin:    General: Skin is dry.  Neurological:     Mental Status: He is alert and oriented to person, place, and time.  Psychiatric:        Behavior: Behavior normal.     Review of Systems  Constitutional: Negative.   HENT: Negative.    Eyes: Negative.   Respiratory: Positive for cough, "cold in chest"  Cardiovascular: Negative.   Gastrointestinal: Negative.   Genitourinary: Negative.   Musculoskeletal: Positive for joint pain, bilateral knee pain due to chronic arthritis.   Skin: Negative.   Neurological:   Negative for dizziness, tingling, tremors, sensory change, speech change, focal weakness, seizures, loss of consciousness, weakness and headaches.  Endo/Heme/Allergies: Negative.   Psychiatric/Behavioral:  Positive for depression/anxiety and substance use (Hx of alcohol use disorder). Negative for hallucinations, memory loss, and active suicidal ideation. The patient does not have insomnia.    Vitals:   12/10/20 0600 12/10/20 0611  BP: 116/78 130/84  Pulse: (!) 115 (!) 105  Resp: 18 16  Temp:  98.8 F (37.1 C)  SpO2: 100%    Labs reviewed: Lipid panel WNL except LDL 102 Hemoglobin A1c 6.8 TSH WNL Glucose 136 at 1159 today  Treatment Plan Summary: Daily contact with patient to assess and evaluate symptoms and progress in treatment and Medication management.    Diagnoses:  Bipolar d/o MRE depressed severe (r/o with psychotic features) R/o substance induced mood d/o Stimulant use d/o - cocaine type Alcohol use d/o Tobacco use d/o DM type II CHF CAD s/p stent Hyperlipidemia Chronic knee pain bilaterally   PLAN: Safety and Monitoring:  -- Voluntary admission to inpatient psychiatric unit for safety, stabilization and treatment  -- Daily contact with patient to assess and evaluate symptoms and progress in treatment  -- Patient's case to be discussed in multi-disciplinary team meeting  -- Observation Level : q15 minute checks  -- Vital signs:  q12 hours  -- Precautions: suicide, elopement, and assault  2. Psychiatric Diagnoses and Treatment:   Bipolar d/o MRE depressed severe (r/o with psychotic features) (r/o substance induced mood d/o) - Continue Cymbalta 20mg  daily for depression watch for signs of mood escalation -- Restart Abilify 5mg  daily - (r/b/se/a to atypical antipsychotic reviewed including risk  of developing TD/EPS, worsening DM, worsening cholesterol, weight gain, EKG changes and he consents to medication trial)  -- Metabolic profile and EKG monitoring obtained  while on an atypical antipsychotic (Lipid Panel:WNL except for LDL 102;  HbgA1c: 6.8; QTc:49ms)   -- Continue Lamictal 25mg  daily retitrating to previous home dose over time given recent medication noncompliance  -- TSH 0.977 (monitoring given depressive symptoms)  -- Encouraged patient to participate in unit milieu and in scheduled group therapies   -- Short Term Goals: Ability to verbalize feelings will improve, Ability to disclose and discuss suicidal ideas, and Ability to demonstrate self-control will improve  -- Long Term Goals: Improvement in symptoms so as ready for discharge   Stimulant use d/o - cocaine type Alcohol use d/o -- Continue CIWA protocol with Ativan 1mg  for scores >10 with MVI and thiamine oral replacement (recent scores 0,1) -- Has PRNs available for symptomatic treatment for possible withdrawal -- Start Neurontin 100mg  tid titrating up for help with potential cravings, withdrawal and anxiety as well as chronic pain -- Encouraged residential rehab options after discharge which he will consider  -- Short Term Goals: Ability to identify triggers associated with substance abuse/mental health issues will improve  -- Long Term Goals: Improvement in symptoms so as ready for discharge   3. Medical Issues Being Addressed:   Tobacco Use Disorder  -- Nicotine patch 14mg /24 hours ordered  -- Smoking cessation encouraged   DM type II  -- Continue Metformin 1000mg  qhs and 500mg  qam  -- SSI coverage with meals and ACHS FSBS  -- Consult placed to diabetic coordinator for glucose management   CHF CAD s/p stent Hyperlipidemia -- Continue ASA 81mg  daily and Plavix 75mg  daily -- Continue Metoprolol XL 25mg  daily -- Continue Crestor 40mg  daily -- Consulting internal medicine about potential restart of Jardiance vs Entrestro or further recommendations for CHF management given his h/o medication noncompliance -- EKG 10/26 preliminary result: Abnormal. Normal sinus rhythm, evidence  of bilateral atrial enlargement, LVH, and possible old inferior infarct - patient currently asymptomatic   Cough  -- Start Tessalon perles PRN and Mucinex PRN  -- Albuterol MDI PRN   Chronic knee pain  -- restarting Neurontin 100mg  tid titrating up as tolerated for help with anxiety, cravings, and pain  -- Tylenol PRN   4. Discharge Planning:   -- Social work and case management to assist with discharge planning and identification of hospital follow-up needs prior to discharge  -- Estimated LOS: 5-7 days  -- Discharge Concerns: Need to establish a safety plan; Medication compliance and effectiveness  -- Discharge Goals: Return home with outpatient referrals for mental health follow-up including medication management/psychotherapy   Shea Evans, MS-3  Attestation for Student Documentation:   I certify that I saw and interviewed the patient together with the medical student and was present for the duration of the interview.  I reviewed the medical record.  I performed or reperformed the mental status examination of the patient as indicated.  I formulated the assessment and plan of treatment as documented above.Viann Fish, MD, [facility] P[facility]

## 2020-12-10 NOTE — Progress Notes (Signed)
Patient refused accucheck this HS stating it has been all day. Patient educated. Support and encouragement provided as needed.

## 2020-12-10 NOTE — Progress Notes (Signed)
   12/10/20 1200  Psych Admission Type (Psych Patients Only)  Admission Status Voluntary  Psychosocial Assessment  Patient Complaints Depression  Eye Contact Fair  Facial Expression Flat  Affect Depressed  Speech Logical/coherent;Soft  Interaction Assertive  Motor Activity Slow  Appearance/Hygiene Poor hygiene  Behavior Characteristics Cooperative  Mood Depressed  Thought Process  Coherency WDL  Content Blaming others  Delusions WDL  Hallucination None reported or observed  Judgment Poor  Confusion None  Danger to Self  Current suicidal ideation? Passive  Self-Injurious Behavior No self-injurious ideation or behavior indicators observed or expressed   Agreement Not to Harm Self Yes (Verbally)  Description of Agreement Verbal  Danger to Others  Danger to Others None reported or observed

## 2020-12-10 NOTE — Group Note (Signed)
Recreation Therapy Group Note   Group Topic:Stress Management  Group Date: 12/10/2020 Start Time: 0935 End Time: 0953 Facilitators: Victorino Sparrow, LRT/CTRS Location: [address] Dayroom   Goal Area(s) Addresses:  Patient will actively participate in stress management techniques presented during session.  Patient will successfully identify benefit of practicing stress management post d/c.    Group Description: Guided Imagery. LRT provided education, instruction, and demonstration on practice of visualization via guided imagery. Patient was asked to participate in the technique introduced during session. Patients were given suggestions of ways to access scripts post d/c and encouraged to explore Youtube and other apps available on smartphones, tablets, and computers.   Affect/Mood: N/A   Participation Level: Did not attend    Clinical Observations/Individualized Feedback: Pt did not attend group.    Plan: Continue to engage patient in RT group sessions 2-3x/week.   Victorino Sparrow, LRT/CTRS 12/10/2020 12:08 PM

## 2020-12-10 NOTE — Progress Notes (Signed)
Psychoeducational Group Note  Date:  12/10/2020 Time:  2224  Group Topic/Focus:  Wrap-Up Group:   The focus of this group is to help patients review their daily goal of treatment and discuss progress on daily workbooks.  Participation Level: Did Not Attend  Participation Quality:  Not Applicable  Affect:  Not Applicable  Cognitive:  Not Applicable  Insight:  Not Applicable  Engagement in Group: Not Applicable  Additional Comments:  The patient did not attend group this evening.   Archie Balboa S 12/10/2020, 10:24 PM

## 2020-12-10 NOTE — BH IP Treatment Plan (Signed)
Interdisciplinary Treatment and Diagnostic Plan Update  12/10/2020 Time of Session: 9:45am  Karina Lenderman MRN: [id number]  Principal Diagnosis: Bipolar affective disorder, current episode depressed ([place])  Secondary Diagnoses: Principal Problem:   Bipolar affective disorder, current episode depressed ([place]) Active Problems:   HLD (hyperlipidemia)   Diabetes type 2, controlled ([place])   HFrEF (heart failure with reduced ejection fraction) ([place])   Tobacco use disorder   Coronary artery disease   S/P coronary artery stent placement (DES to LCx)   Cocaine abuse ([place])   Alcohol abuse   Current Medications:  Current Facility-Administered Medications  Medication Dose Route Frequency Provider Last Rate Last Admin   acetaminophen (TYLENOL) tablet 650 mg  650 mg Oral Q6H PRN Niel Hummer, NP       alum & mag hydroxide-simeth (MAALOX/MYLANTA) 200-200-20 MG/5ML suspension 30 mL  30 mL Oral Q4H PRN Niel Hummer, NP       aspirin EC tablet 81 mg  81 mg Oral Daily Elmarie Shiley A, NP   81 mg at 12/10/20 6629   clopidogrel (PLAVIX) tablet 75 mg  75 mg Oral Daily Elmarie Shiley A, NP   75 mg at 12/10/20 4765   DULoxetine (CYMBALTA) DR capsule 20 mg  20 mg Oral Daily Elmarie Shiley A, NP   20 mg at 12/10/20 4650   feeding supplement (GLUCERNA SHAKE) (GLUCERNA SHAKE) liquid 237 mL  237 mL Oral TID BM Massengill, Ovid Curd, MD       hydrOXYzine (ATARAX/VISTARIL) tablet 25 mg  25 mg Oral Q6H PRN Nelda Marseille, Amy E, MD       insulin aspart (novoLOG) injection 0-9 Units  0-9 Units Subcutaneous TID WC Nelda Marseille, Amy E, MD       lamoTRIgine (LAMICTAL) tablet 25 mg  25 mg Oral Daily Nelda Marseille, Amy E, MD   25 mg at 12/10/20 3546   loperamide (IMODIUM) capsule 2-4 mg  2-4 mg Oral PRN Harlow Asa, MD       LORazepam (ATIVAN) tablet 1 mg  1 mg Oral Q6H PRN Nelda Marseille, Amy E, MD       magnesium hydroxide (MILK OF MAGNESIA) suspension 30 mL  30 mL Oral Daily PRN Niel Hummer, NP       metFORMIN (GLUCOPHAGE-XR) 24  hr tablet 1,000 mg  1,000 mg Oral Q supper Elmarie Shiley A, NP   1,000 mg at 12/09/20 1714   metFORMIN (GLUCOPHAGE-XR) 24 hr tablet 500 mg  500 mg Oral Q breakfast Elmarie Shiley A, NP   500 mg at 12/10/20 5681   metoprolol succinate (TOPROL-XL) 24 hr tablet 25 mg  25 mg Oral Daily Elmarie Shiley A, NP   25 mg at 12/10/20 2751   multivitamin with minerals tablet 1 tablet  1 tablet Oral Daily Harlow Asa, MD   1 tablet at 12/10/20 7001   nicotine polacrilex (NICORETTE) gum 2 mg  2 mg Oral PRN Massengill, Ovid Curd, MD       ondansetron (ZOFRAN-ODT) disintegrating tablet 4 mg  4 mg Oral Q6H PRN Nelda Marseille, Amy E, MD       pantoprazole (PROTONIX) EC tablet 40 mg  40 mg Oral Daily Elmarie Shiley A, NP   40 mg at 12/10/20 7494   rosuvastatin (CRESTOR) tablet 40 mg  40 mg Oral Daily Massengill, Ovid Curd, MD   40 mg at 12/10/20 0840   thiamine tablet 100 mg  100 mg Oral Daily Harlow Asa, MD   100 mg at 12/10/20 0839   traZODone ([place])  tablet 50 mg  50 mg Oral QHS PRN Niel Hummer, NP   50 mg at 12/09/20 2123   PTA Medications: Medications Prior to Admission  Medication Sig Dispense Refill Last Dose   acetaminophen (TYLENOL) 325 MG tablet Take 2 tablets (650 mg total) by mouth every 4 (four) hours as needed for headache or mild pain.      ARIPiprazole (ABILIFY) 10 MG tablet Take 5 mg by mouth daily.      aspirin EC 81 MG tablet Take 81 mg by mouth daily. Swallow whole.      clopidogrel (PLAVIX) 75 MG tablet Take 1 tablet (75 mg total) by mouth daily. 30 tablet 11    DULoxetine (CYMBALTA) 20 MG capsule Take 20 mg by mouth daily.      empagliflozin (JARDIANCE) 25 MG TABS tablet Take 12.5 mg by mouth daily.      furosemide (LASIX) 20 MG tablet Take 20 mg by mouth daily as needed for fluid.      gabapentin (NEURONTIN) 600 MG tablet Take 600 mg by mouth at bedtime as needed (knee pain).      insulin glargine (LANTUS) 100 UNIT/ML injection Inject 0.4 mLs (40 Units total) into the skin at bedtime. (Patient  taking differently: Inject 25 Units into the skin at bedtime.) 10 mL 11    lamoTRIgine (LAMICTAL) 200 MG tablet Take 100 mg by mouth 2 (two) times daily.      lidocaine (LIDODERM) 5 % Place 1 patch onto the skin daily. Remove & Discard patch within 12 hours or as directed by MD apply 1 patch on each knee      metFORMIN (GLUCOPHAGE-XR) 500 MG 24 hr tablet Take 1-2 tablets (500-1,000 mg total) by mouth See admin instructions. Take 1 tablet by mouth every morning, and take 2 tablets every evening      metoprolol succinate (TOPROL-XL) 25 MG 24 hr tablet Take 1 tablet (25 mg total) by mouth daily. 30 tablet 11    Multiple Vitamin (MULTIVITAMIN WITH MINERALS) TABS tablet Take 1 tablet by mouth daily.      nicotine polacrilex (COMMIT) 2 MG lozenge Take 2 mg by mouth every 2 (two) hours as needed for smoking cessation.      omeprazole (PRILOSEC) 20 MG capsule Take 20 mg by mouth daily.      pantoprazole (PROTONIX) 40 MG tablet Take 1 tablet (40 mg total) by mouth daily. 30 tablet 1    rosuvastatin (CRESTOR) 40 MG tablet Take 40 mg by mouth daily.      sacubitril-valsartan (ENTRESTO) 49-51 MG Take 0.5 tablets by mouth 2 (two) times daily.      traZODone (DESYREL) 50 MG tablet Take 50 mg by mouth at bedtime as needed for sleep.       Patient Stressors: Financial difficulties   Substance abuse    Patient Strengths: Ability for insight  General fund of knowledge  Motivation for treatment/growth   Treatment Modalities: Medication Management, Group therapy, Case management,  1 to 1 session with clinician, Psychoeducation, Recreational therapy.   Physician Treatment Plan for Primary Diagnosis: Bipolar affective disorder, current episode depressed ([place]) Long Term Goal(s):     Short Term Goals:    Medication Management: Evaluate patient's response, side effects, and tolerance of medication regimen.  Therapeutic Interventions: 1 to 1 sessions, Unit Group sessions and Medication  administration.  Evaluation of Outcomes: Not Met  Physician Treatment Plan for Secondary Diagnosis: Principal Problem:   Bipolar affective disorder, current episode depressed ([place]) Active  Problems:   HLD (hyperlipidemia)   Diabetes type 2, controlled ([facility])   HFrEF (heart failure with reduced ejection fraction) ([facility])   Tobacco use disorder   Coronary artery disease   S/P coronary artery stent placement (DES to LCx)   Cocaine abuse ([place])   Alcohol abuse  Long Term Goal(s):     Short Term Goals:       Medication Management: Evaluate patient's response, side effects, and tolerance of medication regimen.  Therapeutic Interventions: 1 to 1 sessions, Unit Group sessions and Medication administration.  Evaluation of Outcomes: Not Met   RN Treatment Plan for Primary Diagnosis: Bipolar affective disorder, current episode depressed ([place]) Long Term Goal(s): Knowledge of disease and therapeutic regimen to maintain health will improve  Short Term Goals: Ability to remain free from injury will improve, Ability to participate in decision making will improve, Ability to verbalize feelings will improve, Ability to disclose and discuss suicidal ideas, and Ability to identify and develop effective coping behaviors will improve  Medication Management: RN will administer medications as ordered by provider, will assess and evaluate patient's response and provide education to patient for prescribed medication. RN will report any adverse and/or side effects to prescribing provider.  Therapeutic Interventions: 1 on 1 counseling sessions, Psychoeducation, Medication administration, Evaluate responses to treatment, Monitor vital signs and CBGs as ordered, Perform/monitor CIWA, COWS, AIMS and Fall Risk screenings as ordered, Perform wound care treatments as ordered.  Evaluation of Outcomes: Not Met   LCSW Treatment Plan for Primary Diagnosis: Bipolar affective disorder, current episode depressed  ([place]) Long Term Goal(s): Safe transition to appropriate next level of care at discharge, Engage patient in therapeutic group addressing interpersonal concerns.  Short Term Goals: Engage patient in aftercare planning with referrals and resources, Increase social support, Increase emotional regulation, Facilitate acceptance of mental health diagnosis and concerns, Identify triggers associated with mental health/substance abuse issues, and Increase skills for wellness and recovery  Therapeutic Interventions: Assess for all discharge needs, 1 to 1 time with Social worker, Explore available resources and support systems, Assess for adequacy in community support network, Educate family and significant other(s) on suicide prevention, Complete Psychosocial Assessment, Interpersonal group therapy.  Evaluation of Outcomes: Not Met   Progress in Treatment: Attending groups: Yes. Participating in groups: Yes. Taking medication as prescribed: Yes. Toleration medication: Yes. Family/Significant other contact made: No, will contact:  [place]  Patient understands diagnosis: Yes. Discussing patient identified problems/goals with staff: Yes. Medical problems stabilized or resolved: Yes. Denies suicidal/homicidal ideation: Yes. Issues/concerns per patient self-inventory: No.   New problem(s) identified: No, Describe:  None   New Short Term/Long Term Goal(s): medication stabilization, elimination of SI thoughts, development of comprehensive mental wellness plan.   Patient Goals: "To think more positively and get into residential treatment"   Discharge Plan or Barriers: Patient recently admitted. CSW will continue to follow and assess for appropriate referrals and possible discharge planning.   Reason for Continuation of Hospitalization: Depression Medication stabilization Suicidal ideation  Estimated Length of Stay: 3 to 5 days    Scribe for Treatment Team: Darleen Crocker,  Latanya Presser 12/10/2020 3:08 PM

## 2020-12-10 NOTE — Progress Notes (Signed)
Inpatient Diabetes Program Recommendations  AACE/ADA: New Consensus Statement on Inpatient Glycemic Control (2015)  Target Ranges:  Prepandial:   less than 140 mg/dL      Peak postprandial:   less than 180 mg/dL (1-2 hours)      Critically ill patients:  140 - 180 mg/dL   Lab Results  Component Value Date   GLUCAP 136 (H) 12/10/2020   HGBA1C 6.8 (H) 12/10/2020    Review of Glycemic Control Results for [name], [name] (MRN [id number]) as of 12/10/2020 12:22  Ref. Range 12/08/2020 08:35 12/09/2020 17:13 12/09/2020 20:45 12/10/2020 05:32 12/10/2020 11:59  Glucose-Capillary Latest Ref Range: 70 - 99 mg/dL 107 (H) 172 (H) 191 (H) 118 (H) 136 (H)   Diabetes history: DM2 Outpatient Diabetes medications: Metformin 500 mg q am, 1 gm q pm Current orders for Inpatient glycemic control: Metformin 500 mg am, 1 gm pm, Novolog 0-9 units tid correction  Inpatient Diabetes Program Recommendations:   Received consult regarding diabetes management. A1c of 6.8 shows excellent glucose control and patient is currently off of insulin. Agree with current regimen.  Thank you, Nani Gasser. Tavaughn Silguero, RN, MSN, CDE  Diabetes Coordinator Inpatient Glycemic Control Team Team Pager [phone] (8am-5pm) 12/10/2020 12:23 PM

## 2020-12-11 DIAGNOSIS — F3130 Bipolar disorder, current episode depressed, mild or moderate severity, unspecified: Secondary | ICD-10-CM | POA: Diagnosis not present

## 2020-12-11 LAB — GLUCOSE, CAPILLARY
Glucose-Capillary: 96 mg/dL (ref 70–99)
Glucose-Capillary: 92 mg/dL (ref 70–99)
Glucose-Capillary: 107 mg/dL — ABNORMAL HIGH (ref 70–99)

## 2020-12-11 LAB — RESP PANEL BY RT-PCR (FLU A&B, COVID) ARPGX2
Influenza A by PCR: NEGATIVE
Influenza B by PCR: NEGATIVE
SARS Coronavirus 2 by RT PCR: NEGATIVE

## 2020-12-11 MED ORDER — ALBUTEROL SULFATE HFA 108 (90 BASE) MCG/ACT IN AERS
1.0000 | INHALATION_SPRAY | Freq: Four times a day (QID) | RESPIRATORY_TRACT | Status: DC | PRN
  Administered 2020-12-12 – 2020-12-13 (×3): 2 via RESPIRATORY_TRACT
  Filled 2020-12-11: qty 6.7

## 2020-12-11 MED ORDER — GABAPENTIN 100 MG PO CAPS
200.0000 mg | ORAL_CAPSULE | Freq: Three times a day (TID) | ORAL | Status: DC
  Administered 2020-12-11 – 2020-12-16 (×15): 200 mg via ORAL
  Filled 2020-12-11 (×18): qty 2

## 2020-12-11 NOTE — Progress Notes (Signed)
CSW called [facility] regarding patient ability to go rehab at location. Patient would not qualify due to taking insulin and having supplemental insurance.  Representative was able to provide the supplemental insurance for patient.   [facility] [phone] Member ID: [id number] Group #: [id number]     Riki Altes, LCSW, [place] Social Worker  [facility]

## 2020-12-11 NOTE — Progress Notes (Signed)
CSW sent referral to [facility] in [place] for residential program. CSW was informed that patient will need to get approved for services from [place] due to using [place] services.  CSW faxed [facility] Provider request for service application to patient [name] social worker: [name], [phone] ext [phone]. CSW informed that social worker is out of the office until 10/31.   CSW contacted social work [employer] and left messages to see if they received application for approval of services.  At this time, social work waiting for call back.    Kevin Vanderberg, LCSW, [place] Social Worker  [facility]

## 2020-12-11 NOTE — Progress Notes (Addendum)
[facility] Progress Note   12/10/2020 12:26 PM [name]  MRN:  [id number]   Subjective: [name] reports, "Not feeling too well this morning because I still have this cough. Mood is getting a little better. I am thinking positive and trying to be more assertive about the situation."   Daily notes: [name] was seen in the presence of Dr. Nelda Marseille and chart was reviewed. The chart findings were discussed with the treatment team. He presents alert & oriented x 4. He was in bed this morning. He said he was not feeling too well because of a cough, which started yesterday. He says he has a chest cold, has to cough every 30 minutes, and has experienced no relief after taking Mucinex and Tessalon perles. He says the cough has prevented him from going to group sessions. He said he has felt a little feverish and his throat feels scratchy, but he denied headache and difficulty breathing.  He says his mood is improving because he is choosing to think positive and "be more assertive" about his current situation. He said he is not having feelings about hurting himself today and he denies current HI intent or plan. He denied AVH, ideas of reference, first rank symptoms, or current paranoia on the unit. He continues to believe that the same male may be looking for him in the community over money disputes, but he plans to relocate after discharge to avoid her. He does not appear to be responding to any internal stimuli.   He wants to go to [facility] [place] for potential residential rehab. He denies current cravings for drugs or alcohol and denies cravings, shakes, and withdrawal symptoms. He is taking and tolerating his current medications. Abilify 5mg  was restarted yesterday. Metoprolol held earlier this morning because at 0545, pulse was elevated to 116 and BP 88/77. Repeat at 0845 had PR 121 and BP 93/68. Patient was in no acute distress and was encouraged to keep hydrating, so he drank Gatorade. He  says he has done better with eating since he arrived and now eats everything on his plates. He said he is sleeping well and tries to get up a reasonable time, but he said noise at night bothers him. He is also bothered by his chronic, bilateral knee pain, so Neurontin will be titrated up today. He is in agreement to continue current plan of care as already in progress.    Principal Problem: Bipolar d/o current episode depressed   Diagnosis: Active Problems: Bipolar d/o current episode depressed DM type II CHF Tobacco use d/o CAD s/p stent Cocaine abuse Alcohol abuse Hyperlipidemia   Total Time Spent in Direct Patient Care:  I personally spent 30 minutes on the unit in direct patient care. The direct patient care time included face-to-face time with the patient, reviewing the patient's chart, communicating with other professionals, and coordinating care. Greater than 50% of this time was spent in counseling or coordinating care with the patient regarding goals of hospitalization, psycho-education, and discharge planning needs.  Past Psychiatric History: Patient was told he had bipolar disorder in 1991 but at one point was told he could have PTSD or schizophrenia. He has had [facility] hospital[facility] at [facility] but does not recall dates of treatments. He currently receives psychiatric care through the [facility]. Patient says he started receiving care at the [facility] in the 1990s. He started in [place], [place], but has also received care in [place], other cities in [place], and various cities in [place]  and [place]. According to EHR he previously reported suicide attempts via jumping into traffic and jumping from a moving vehicle. He has been on various medications in the past including Zoloft, Prozac, and Haldol. Per records, he has also previously been on Neurontin, Abilify, Lamictal, and Cymbalta.  Past Medical History:        Past Medical History:  Diagnosis Date   Anxiety     Arthritis      Cancer ([place])     Cataract     Depression     Diabetes mellitus     GERD (gastroesophageal reflux disease)     H/O suicide attempt 1997 and 2011    jumped out of moving vehicle and attempted to jump into moving traffic   High cholesterol     Hypertension     Neuropathic pain     Osteoporosis     CAD s/p stent CHF s/p paracentesis        Past Surgical History:  Procedure Laterality Date   COLONOSCOPY       CORONARY STENT INTERVENTION N/A 10/10/2020    Procedure: CORONARY STENT INTERVENTION;  Surgeon: Jettie Booze, MD;  Location: [facility];  Service: Cardiovascular;  Laterality: N/A;   LEFT HEART CATH AND CORONARY ANGIOGRAPHY N/A 10/10/2020    Procedure: LEFT HEART CATH AND CORONARY ANGIOGRAPHY;  Surgeon: Jettie Booze, MD;  Location: [facility];  Service: Cardiovascular;  Laterality: N/A;   NEPHRECTOMY        due to cancer   RIGHT/LEFT HEART CATH AND CORONARY ANGIOGRAPHY N/A 10/09/2020    Procedure: RIGHT/LEFT HEART CATH AND CORONARY ANGIOGRAPHY;  Surgeon: Nelva Bush, MD;  Location: [facility];  Service: Cardiovascular;  Laterality: N/A;    Family History:  Patient says his mother and sister have had "bad leg swelling" due to fluid buildup. He said they ate a lot of soul food together.        Family History  Problem Relation Age of Onset   Alcohol abuse Sister     Alcohol abuse Brother     Colon cancer Neg Hx     Esophageal cancer Neg Hx     Pancreatic cancer Neg Hx     Rectal cancer Neg Hx     Stomach cancer Neg Hx      Family Psychiatric History: Maternal aunt had schizophrenia and committed suicide. Patient has a younger sister that had mental health problems and has been addicted to pills and cocaine.   Tobacco Screening:  Patient has smoked 1/2 pack per day since he was 65 years old. 23.5 pack-year smoking history.   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 Noncombat service - After serving, he was homeless for 30 years. 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 He denies legal charges.    Marital status: Single, never married Are you sexually active?: No What is your sexual orientation?: Straight Has your sexual activity been affected by drugs,  alcohol, medication, or emotional stress?: No Does patient have children?: No   Allergies:       Allergies  Allergen Reactions   Morphine And Related Other (See Comments)      Pt unable to take narcotics.   Patient is not actually allergic to morphine and narcotics. He prefers to avoid them when possible due to his substance use history.   Medications:  Scheduled Meds:  ARIPiprazole  5 mg Oral Daily   aspirin EC  81 mg Oral Daily   clopidogrel  75 mg Oral Daily   DULoxetine  20 mg Oral Daily   feeding supplement (GLUCERNA SHAKE)  237 mL Oral TID BM   gabapentin  200 mg Oral TID   insulin aspart  0-9 Units Subcutaneous TID WC   lamoTRIgine  25 mg Oral Daily   metFORMIN  1,000 mg Oral Q supper   metFORMIN  500 mg Oral Q breakfast   metoprolol succinate  25 mg Oral Daily   multivitamin with minerals  1 tablet Oral Daily   pantoprazole  40 mg Oral Daily   rosuvastatin  40 mg Oral Daily   thiamine  100 mg Oral Daily   Continuous Infusions: None PRN Meds:.acetaminophen, albuterol, alum & mag hydroxide-simeth, benzonatate, dextromethorphan-guaiFENesin, hydrOXYzine, loperamide, LORazepam, magnesium hydroxide, nicotine, nicotine polacrilex, ondansetron, traZODone    Physical Findings:   Presentation  General Appearance: Appropriate for  Environment; Casual; Fairly Groomed   Eye Contact:Good   Speech:Normal Rate; Clear and Coherent   Speech Volume:Normal   Handedness:Right   Mood and Affect  Mood:-- ("Getting better. I am thinking positive and trying to be assertive about the situation.") appears more euthymic  Affect: congruent, polite, calm   Thought Process  Thought Processes:Coherent; Goal Directed; Linear   Descriptions of Associations:Intact   Orientation:Full (Time, Place and Person)   Thought Content:Not grossly responding to internal/external stimuli on exam. Denies AVH, paranoia, ideas of reference, or first rank symptoms. Continues to believe a male is after him in the community over money which may not be a delusion but is not distressed by this presently   History of Schizophrenia/Schizoaffective disorder:No, patient says he was misdiagnosed previously.   Duration of Psychotic Symptoms: Greater than 2 weeks.   Hallucinations: None   Ideas of Reference: None   Suicidal Thoughts: Denied   Homicidal Thoughts: Denied   Sensorium  Memory:Immediate Good; Recent Good; Remote Fair   Judgment:Fair   Insight:Fair   Executive Functions  Concentration:Good   Attention Span:Good   [place]   Psychomotor Activity:Normal   Assets:Desire for Improvement; Armed forces logistics/support/administrative officer; Leisure Time; Resilience; Vocational/Educational; A Few Friends Who Give Sound Advice  Sleep: Patient said he sleeps well. Documented 6.5 hours overnight   Physical Exam Vitals reviewed.  Constitutional:      General: He is not in acute distress.    Appearance: Normal appearance.  HENT:     Head: Normocephalic and atraumatic.  Cardiovascular:     Rate and Rhythm: Normal rate.  Pulmonary:     Effort: Pulmonary effort is normal. No respiratory distress.     Breath sounds: No wheezing.  Skin:    General: Skin is dry.  Neurological:     Mental Status: He is alert  and oriented to person, place, and time.  Psychiatric:        Behavior: Behavior normal.   Review of Systems  Constitutional: Patient said he feels "feverish"  HENT: Negative.  Eyes: Negative.   Respiratory: Positive for cough, "cold in chest" and "scratchy throat"  Cardiovascular: Negative.   Gastrointestinal: Negative.   Genitourinary: Negative.   Musculoskeletal: Positive for joint pain, bilateral knee pain due to chronic arthritis.   Skin: Negative.   Neurological:  Negative for dizziness, tingling, tremors, sensory change, speech change, focal weakness, seizures, loss of consciousness, weakness and headaches.  Endo/Heme/Allergies: Negative.   Psychiatric/Behavioral:  Positive for depression/anxiety and substance use (Hx of alcohol use disorder). Negative for hallucinations, memory loss, and active suicidal ideation. The patient does not have insomnia.    Vitals:   12/11/20 0545 12/11/20 0846  BP: (!) 88/77 93/68  Pulse: (!) 116 (!) 121  Resp:    Temp: 99.8 F (37.7 C)   SpO2:     Labs reviewed: Glucose level in the 90s today  Treatment Plan Summary: Daily contact with patient to assess and evaluate symptoms and progress in treatment and Medication management.    Diagnoses:  Bipolar d/o MRE depressed severe (r/o with psychotic features) R/o substance induced mood d/o Stimulant use d/o - cocaine type Alcohol use d/o Tobacco use d/o DM type II CHF CAD s/p stent Hyperlipidemia Chronic knee pain bilaterally   PLAN: Safety and Monitoring:  -- Voluntary admission to inpatient psychiatric unit for safety, stabilization and treatment  -- Daily contact with patient to assess and evaluate symptoms and progress in treatment  -- Patient's case to be discussed in multi-disciplinary team meeting  -- Observation Level : q15 minute checks  -- Vital signs:  q12 hours  -- Precautions: suicide, elopement, and assault  2. Psychiatric Diagnoses and Treatment:   Bipolar d/o MRE  depressed severe (r/o with psychotic features) (r/o substance induced mood d/o) -- Continue Cymbalta 20mg  daily for depression, watch for signs of mood escalation -- Continue Abilify 5mg  daily for mood stabilization and potential delusions  -- Continue Lamictal 25mg  daily, retitrating to previous home dose over time given recent medication noncompliance  -- Encouraged patient to participate in unit milieu and in scheduled group therapies   -- Short Term Goals: Ability to verbalize feelings will improve, Ability to disclose and discuss suicidal ideas, and Ability to demonstrate self-control will improve  -- Long Term Goals: Improvement in symptoms so as ready for discharge   Stimulant use d/o - cocaine type Alcohol use d/o -- Continue CIWA protocol with Ativan 1mg  for scores >10 with MVI and thiamine oral replacement (recent scores 0,0) -- Has PRNs available for symptomatic treatment for possible withdrawal -- Continue Neurontin. Titrate up to 200mg  tid from 100mg  tid titrating up for help with potential cravings, withdrawal and anxiety as well as chronic pain -- Encouraged residential rehab options after discharge and he is working with SW on options and referrals  -- Short Term Goals: Ability to identify triggers associated with substance abuse/mental health issues will improve  -- Long Term Goals: Improvement in symptoms so as ready for discharge   3. Medical Issues Being Addressed:   Cough -- Chest x-ray -- Repeat Covid Test -- Add Albuterol MDI PRN -- Continue Tessalon perles PRN and Mucinex PRN  Tobacco Use Disorder  -- Nicotine patch 14mg /24 hours ordered  -- Smoking cessation encouraged   DM type II  -- Continue Metformin 1000mg  qhs and 500mg  qam  -- SSI coverage with meals and ACHS FSBS   CHF CAD s/p stent Hyperlipidemia -- Continue ASA 81mg  daily and Plavix 75mg  daily -- Continue Crestor 40mg  daily -- Metoprolol XL 25mg  daily held  this morning, restart after BP improves  and stabilizes -- Consulted medicine about potential restart of Jardiance vs. Entresto for CHF management recommendations and at this time will continue to hold both until patient can f/u with outpatient cardiology. -- EKG 10/26 preliminary result: Abnormal. Normal sinus rhythm, evidence of bilateral atrial enlargement, LVH, and possible old inferior infarct - patient currently asymptomatic   Chronic knee pain  -- Increase Neurontin 200mg  tid. Started 100mg  tid yesterday, but titrating up as tolerated for help with anxiety, cravings, and pain  -- Tylenol PRN   4. Discharge Planning:   -- Social work and case management to assist with discharge planning and identification of hospital follow-up needs prior to discharge  -- Estimated LOS: 5-7 days  -- Discharge Concerns: Need to establish a safety plan; Medication compliance and effectiveness  -- Discharge Goals: Return home with outpatient referrals for mental health follow-up including medication management/psychotherapy   Shea Evans, MS-3  Attestation for Student Documentation:   I personally was present and performed or re-performed the history, physical exam and medical decision-making activities of this service and have verified that the service and findings are accurately documented in the student's note.  Viann Fish, MD, [facility] Ponder

## 2020-12-11 NOTE — Progress Notes (Signed)
Psychoeducational Group Note  Date:  12/11/2020 Time:  2015  Group Topic/Focus:  Wrap up group  Participation Level: Did Not Attend  Participation Quality:  Not Applicable  Affect:  Not Applicable  Cognitive:  Not Applicable  Insight:  Not Applicable  Engagement in Group: Not Applicable  Additional Comments:  Did not attend.   Shellia Cleverly 12/11/2020, 9:34 PM

## 2020-12-11 NOTE — Consult Note (Signed)
Called by [facility] staff for rec Re: CHF regimen. Patient with recent LHC (8/22). Noted to have HFrEF. At discharge he was placed on  plavix, ASA, metoprolol, jardiance, lasix, crestor at discharge. Patient never filled jardiance. At some point between then and his admission to [facility], the patient was prescribed entresto. It is not clear who did that. The patient has not taken entresto. Recommend that patient get cardiology follow up in HF clinic with regular monitoring and adjustments of his medications. At this point, would hold jardiance and entresto until he is able to see cardiology.

## 2020-12-11 NOTE — Progress Notes (Signed)
   12/11/20 0545  Vital Signs  Temp 99.8 F (37.7 C)  Pulse Rate (!) 116  BP (!) 88/77  BP Location Left Arm  BP Method Automatic  Patient Position (if appropriate) Sitting  Patient encouraged to drink more fluid water pincher given to Patient. Patient is alert and oriented by 3 otherwise in no distress.

## 2020-12-11 NOTE — [facility] Group Notes (Signed)
Patient did not attend the relaxation group. 

## 2020-12-12 ENCOUNTER — Inpatient Hospital Stay (HOSPITAL_COMMUNITY): Payer: Medicaid Other

## 2020-12-12 DIAGNOSIS — F3130 Bipolar disorder, current episode depressed, mild or moderate severity, unspecified: Secondary | ICD-10-CM | POA: Diagnosis not present

## 2020-12-12 LAB — GLUCOSE, CAPILLARY
Glucose-Capillary: 114 mg/dL — ABNORMAL HIGH (ref 70–99)
Glucose-Capillary: 149 mg/dL — ABNORMAL HIGH (ref 70–99)
Glucose-Capillary: 138 mg/dL — ABNORMAL HIGH (ref 70–99)

## 2020-12-12 MED ORDER — LOPERAMIDE HCL 2 MG PO CAPS: 2.0000 mg | ORAL_CAPSULE | ORAL | Status: AC | PRN

## 2020-12-12 MED ORDER — ONDANSETRON 4 MG PO TBDP: 4.0000 mg | ORAL_TABLET | Freq: Four times a day (QID) | ORAL | Status: AC | PRN

## 2020-12-12 MED ORDER — DOXYCYCLINE HYCLATE 100 MG PO TABS
100.0000 mg | ORAL_TABLET | Freq: Two times a day (BID) | ORAL | Status: DC
  Administered 2020-12-12 – 2020-12-16 (×8): 100 mg via ORAL
  Filled 2020-12-12 (×11): qty 1

## 2020-12-12 MED ORDER — HYDROXYZINE HCL 25 MG PO TABS: 25.0000 mg | ORAL_TABLET | Freq: Four times a day (QID) | ORAL | Status: AC | PRN

## 2020-12-12 MED ORDER — LORAZEPAM 1 MG PO TABS: 1.0000 mg | ORAL_TABLET | Freq: Four times a day (QID) | ORAL | Status: DC | PRN

## 2020-12-12 NOTE — [facility] Group Notes (Signed)
The focus of this group is to help patients establish daily goals to achieve during treatment and discuss how the patient can incorporate goal setting into their daily lives to aide in recovery.  Pt attended morning goals group. He stated that he wanted to focus on making the right choices and not being afraid to ask for help.

## 2020-12-12 NOTE — Progress Notes (Addendum)
[facility] Progress Note   12/10/2020 12:26 PM [name]  MRN:  [id number]   Subjective: Nafis reports, "I still have cough. Mood is getting a little better. I am thinking positive and trying to be more assertive about the situation."   Daily notes: Patient seen and assessed with attending Dr. Nelda Marseille. The chart findings were discussed with the treatment team. He presents alert & oriented x 4. He was in bed this morning. He said he was not feeling too well because of a cough, which started 2 days ago. Patient states that he had not attempted the albuterol so he would attempt it this morning to see if it would provide him with some relief. Patient continues to refuse insulin but has begun taking his Glucerna Shake.  He says his mood is improving because he is choosing to think positive about his current situation. Patient denies present SI/HI/AVH, ideas of reference, first rank symptoms, or current paranoia on the unit. He continues to believe that the same male may be looking for him in the community over money disputes, but he plans to relocate after discharge to avoid her. He does not appear to be responding to any internal stimuli.   He wants to go to [facility] [place] for potential residential rehab. Discussed with patient that the application may not be completed by time he is discharged as CSW is working on getting his application processed through the [place]. Patient verbalized understanding and states he would be agreeable to staying in a homeless shelter until he is either accepted to [place] [facility] or [facility] where they would be able to facilitate a transfer to a residential treatment facility. Patient states he would not attempt to find the male that is after him and he would not disclose his location to her either after discharge. He denies current cravings for drugs or alcohol and denies cravings, shakes, and withdrawal symptoms. He is taking and  tolerating his current medications. Patient is in no acute distress and was encouraged to keep hydrating. He says he has done better with eating since he arrived and now eats everything on his plate. He said he is sleeping well and tries to get up a reasonable time, but he said noise at night bothers him. Patient is appreciative of medication management and management of patient's care.    Principal Problem: Bipolar d/o current episode depressed   Diagnosis: Active Problems: Bipolar d/o current episode depressed DM type II CHF Tobacco use d/o CAD s/p stent Cocaine abuse Alcohol abuse Hyperlipidemia   Total Time Spent in Direct Patient Care:  I personally spent 30 minutes on the unit in direct patient care. The direct patient care time included face-to-face time with the patient, reviewing the patient's chart, communicating with other professionals, and coordinating care. Greater than 50% of this time was spent in counseling or coordinating care with the patient regarding goals of hospitalization, psycho-education, and discharge planning needs.  Past Psychiatric History: Patient was told he had bipolar disorder in 1991 but at one point was told he could have PTSD or schizophrenia. He has had psychiatric hospitalizations at [place] [facility] but does not recall dates of treatments. He currently receives psychiatric care through the [place]. Patient says he started receiving care at the [place] in the 1990s. He started in [place], [place], but has also received care in [place], other cities in [place], and various cities in [place] and [place]. According to EHR he previously reported suicide  attempts via jumping into traffic and jumping from a moving vehicle. He has been on various medications in the past including Zoloft, Prozac, and Haldol. Per records, he has also previously been on Neurontin, Abilify, Lamictal, and Cymbalta.  Past Medical History:        Past Medical History:  Diagnosis Date   Anxiety      Arthritis     Cancer ([place])     Cataract     Depression     Diabetes mellitus     GERD (gastroesophageal reflux disease)     H/O suicide attempt 1997 and 2011    jumped out of moving vehicle and attempted to jump into moving traffic   High cholesterol     Hypertension     Neuropathic pain     Osteoporosis     CAD s/p stent CHF s/p paracentesis        Past Surgical History:  Procedure Laterality Date   COLONOSCOPY       CORONARY STENT INTERVENTION N/A 10/10/2020    Procedure: CORONARY STENT INTERVENTION;  Surgeon: Jettie Booze, MD;  Location: [facility];  Service: Cardiovascular;  Laterality: N/A;   LEFT HEART CATH AND CORONARY ANGIOGRAPHY N/A 10/10/2020    Procedure: LEFT HEART CATH AND CORONARY ANGIOGRAPHY;  Surgeon: Jettie Booze, MD;  Location: [facility];  Service: Cardiovascular;  Laterality: N/A;   NEPHRECTOMY        due to cancer   RIGHT/LEFT HEART CATH AND CORONARY ANGIOGRAPHY N/A 10/09/2020    Procedure: RIGHT/LEFT HEART CATH AND CORONARY ANGIOGRAPHY;  Surgeon: Nelva Bush, MD;  Location: [facility];  Service: Cardiovascular;  Laterality: N/A;    Family History:  Patient says his mother and sister have had "bad leg swelling" due to fluid buildup. He said they ate a lot of soul food together.        Family History  Problem Relation Age of Onset   Alcohol abuse Sister     Alcohol abuse Brother     Colon cancer Neg Hx     Esophageal cancer Neg Hx     Pancreatic cancer Neg Hx     Rectal cancer Neg Hx     Stomach cancer Neg Hx      Family Psychiatric History: Maternal aunt had schizophrenia and committed suicide. Patient has a younger sister that had mental health problems and has been addicted to pills and cocaine.   Tobacco Screening:  Patient has smoked 1/2 pack per day since he was 65 years old. 23.5 pack-year smoking history.   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 Noncombat service - After serving, he was homeless for 30 years. 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 He denies legal charges.    Marital status: Single, never married Are you sexually active?: No What is your sexual orientation?: Straight Has your sexual activity been affected by drugs, alcohol, medication, or emotional stress?: No Does patient have  children?: No   Allergies:       Allergies  Allergen Reactions   Morphine And Related Other (See Comments)      Pt unable to take narcotics.   Patient is not actually allergic to morphine and narcotics. He prefers to avoid them when possible due to his substance use history.   Medications:  Scheduled Meds:  ARIPiprazole  5 mg Oral Daily   aspirin EC  81 mg Oral Daily   clopidogrel  75 mg Oral Daily   DULoxetine  20 mg Oral Daily   feeding supplement (GLUCERNA SHAKE)  237 mL Oral TID BM   gabapentin  200 mg Oral TID   insulin aspart  0-9 Units Subcutaneous TID WC   lamoTRIgine  25 mg Oral Daily   metFORMIN  1,000 mg Oral Q supper   metFORMIN  500 mg Oral Q breakfast   metoprolol succinate  25 mg Oral Daily   multivitamin with minerals  1 tablet Oral Daily   pantoprazole  40 mg Oral Daily   rosuvastatin  40 mg Oral Daily   thiamine  100 mg Oral Daily   Continuous Infusions: None PRN Meds:.acetaminophen, albuterol, alum & mag hydroxide-simeth, benzonatate, dextromethorphan-guaiFENesin, magnesium hydroxide, nicotine, nicotine polacrilex, traZODone    Physical Findings:   Presentation  General Appearance: Appropriate for Environment; Casual; Fairly  Groomed   Eye Contact:Good   Speech:Normal Rate; Clear and Coherent   Speech Volume:Normal   Handedness:Right   Mood and Affect  Mood:-- described as improving - appears more euthymic  Affect: congruent, polite, calm   Thought Process  Thought Processes:Coherent; Goal Directed; Linear   Descriptions of Associations:Intact   Orientation:Full (Time, Place and Person)   Thought Content:Not grossly responding to internal/external stimuli on exam. Denies AVH, paranoia, ideas of reference, or first rank symptoms. Continues to believe a male is after him in the community over money which may not be a delusion but is not distressed by this presently   History of Schizophrenia/Schizoaffective disorder:No, patient says he was misdiagnosed previously.   Duration of Psychotic Symptoms: Greater than 2 weeks.   Hallucinations: None   Ideas of Reference: None   Suicidal Thoughts: Denied   Homicidal Thoughts: Denied   Sensorium  Memory:Immediate Good; Recent Good; Remote Fair   Judgment:Fair   Insight:Fair   Executive Functions  Concentration:Good   Attention Span:Good   [place]   Psychomotor Activity:Normal   Assets:Desire for Improvement; Armed forces logistics/support/administrative officer; Leisure Time; Resilience; Vocational/Educational; A Few Friends Who Give Sound Advice  Sleep: Patient said he sleeps well. Documented 6.5 hours overnight   Physical Exam Vitals reviewed.  Constitutional:      General: He is not in acute distress.    Appearance: Normal appearance.  HENT:     Head: Normocephalic and atraumatic.  Cardiovascular:     Rate and Rhythm: Normal rate.  Pulmonary:     Effort: Pulmonary effort is normal. No respiratory distress.     Breath sounds: Normal breath sounds. No wheezing.  Skin:    General: Skin is dry.  Neurological:     Mental Status: He is alert and oriented to person, place, and time.  Psychiatric:        Behavior:  Behavior normal.   Review of Systems  Denies CP, SOB but has cough and chest congestion. Denies N/V/D   Vitals:   12/12/20 0656 12/12/20 1500  BP: 126/82 137/77  Pulse: (!) 101 100  Resp: 18  18  Temp: 98.6 F (37 C) 100 F (37.8 C)  SpO2: 100%    Labs reviewed: Glucose level in the 90s today  Treatment Plan Summary: Daily contact with patient to assess and evaluate symptoms and progress in treatment and Medication management.    Diagnoses:  Bipolar d/o MRE depressed severe (r/o with psychotic features) R/o substance induced mood d/o Stimulant use d/o - cocaine type Alcohol use d/o Tobacco use d/o DM type II CHF CAD s/p stent Hyperlipidemia Chronic knee pain bilaterally   PLAN: Safety and Monitoring:  -- Voluntary admission to inpatient psychiatric unit for safety, stabilization and treatment  -- Daily contact with patient to assess and evaluate symptoms and progress in treatment  -- Patient's case to be discussed in multi-disciplinary team meeting  -- Observation Level : q15 minute checks  -- Vital signs:  q12 hours  -- Precautions: suicide, elopement, and assault  2. Psychiatric Diagnoses and Treatment:   Bipolar d/o MRE depressed severe (r/o with psychotic features) (r/o substance induced mood d/o) -- Continue Cymbalta 20mg  daily for depression, watch for signs of mood escalation -- Continue Abilify 5mg  daily for mood stabilization and potential delusions (Lipid panel WNL except for LDL 102; A1c 6.8, QTC 450ms)   -- Continue Lamictal 25mg  daily, retitrating to previous home dose over time given recent medication noncompliance  -- Encouraged patient to participate in unit milieu and in scheduled group therapies   -- Short Term Goals: Ability to verbalize feelings will improve, Ability to disclose and discuss suicidal ideas, and Ability to demonstrate self-control will improve  -- Long Term Goals: Improvement in symptoms so as ready for discharge   Stimulant use d/o  - cocaine type Alcohol use d/o -- Continue CIWA protocol with Ativan 1mg  for scores >10 with MVI and thiamine oral replacement (recent scores 0,0) -- Has PRNs available for symptomatic treatment for possible withdrawal -- Continue Neurontin 200mg  tid for help with potential cravings, withdrawal and anxiety as well as chronic pain -- Encouraged residential rehab options after discharge and he is working with SW on options and referrals  -- Short Term Goals: Ability to identify triggers associated with substance abuse/mental health issues will improve  -- Long Term Goals: Improvement in symptoms so as ready for discharge   3. Medical Issues Being Addressed:   Cough -- Chest x-ray (10/28): mild central bronchial thickening -- Low grade temp today of 100.0 - will start Doxycycline 100mg  bid for 7 days and monitor -- Repeat Covid Test negative -- Albuterol MDI PRN -- Continue Tessalon perles PRN and Mucinex PRN -- Sats 100% RA  Tobacco Use Disorder  -- Nicotine patch 14mg /24 hours ordered  -- Smoking cessation encouraged   DM type II  -- Continue Metformin 1000mg  qhs and 500mg  qam  -- SSI coverage with meals and ACHS FSBS (patient continues to refuse)   CHF CAD s/p stent Hyperlipidemia -- Continue ASA 81mg  daily and Plavix 75mg  daily -- Continue Crestor 40mg  daily -- Continue Metoprolol XL 25mg  daily  -- Consulted medicine about potential restart of Jardiance vs. Entresto for CHF management recommendations and at this time will continue to hold both until patient can f/u with outpatient cardiology. -- EKG 10/26 Normal sinus rhythm, evidence of bilateral atrial enlargement, LVH, nonspecific T wave abnormality, cannot r/o inferior infarct age undetermined - no acute changes since last tracing -  patient currently asymptomatic   Chronic knee pain  -- Continue Neurontin 200mg  tid.   -- Tylenol PRN   4.  Discharge Planning:   -- Social work and case management to assist with discharge  planning and identification of hospital follow-up needs prior to discharge  -- Estimated LOS: 5-7 days  -- Discharge Concerns: Need to establish a safety plan; Medication compliance and effectiveness  -- Discharge Goals: Return home with outpatient referrals for mental health follow-up including medication management/psychotherapy  France Ravens, MD PGY1 Psychiatry Resident

## 2020-12-12 NOTE — Progress Notes (Signed)
Pt stated he was doing better this evening.     12/12/20 2000  Psych Admission Type (Psych Patients Only)  Admission Status Voluntary  Psychosocial Assessment  Patient Complaints Depression  Eye Contact Fair  Facial Expression Flat  Affect Appropriate to circumstance  Speech Logical/coherent;Soft  Interaction Assertive  Motor Activity Slow  Appearance/Hygiene Poor hygiene;Body odor  Behavior Characteristics Cooperative  Mood Depressed  Aggressive Behavior  Effect No apparent injury  Thought Process  Coherency WDL  Content Blaming others  Delusions WDL  Perception WDL  Hallucination None reported or observed  Judgment Poor  Confusion None  Danger to Self  Current suicidal ideation? Passive  Self-Injurious Behavior No self-injurious ideation or behavior indicators observed or expressed   Agreement Not to Harm Self Yes (Verbally)  Description of Agreement Verbal  Danger to Others  Danger to Others None reported or observed

## 2020-12-12 NOTE — Group Note (Signed)
LCSW Group Therapy Note   Group Date: 12/12/2020 Start Time: 1300 End Time: 1400   Type of Therapy and Topic:  Group Therapy:   Participation Level:  Active  Topic:   Due to the acuity and complex discharge plans, group was not held. Patient was provided therapeutic worksheets and asked to meet with CSW as needed.  Darleen Crocker, LCSWA 12/12/2020  1:57 PM

## 2020-12-12 NOTE — Progress Notes (Signed)
CSW called [facility] supervisor to inquire about getting patient connected to residential services.  [facility] supervisor informed this social worker that usually the PCP puts in these referrals to make sure patient does not first qualify for [facility] services.  Supervisor agreed to look into options for patient and to call this CSW back to assist with coordinating treatment for patient.  CSW provided contact information for a return call.    Juni Glaab, LCSW, [place] Social Worker  [facility]

## 2020-12-12 NOTE — Progress Notes (Signed)
   12/12/20 0000  Psych Admission Type (Psych Patients Only)  Admission Status Voluntary  Psychosocial Assessment  Patient Complaints Depression  Eye Contact Fair  Facial Expression Flat  Affect Depressed  Speech Logical/coherent;Soft  Interaction Assertive  Motor Activity Slow  Appearance/Hygiene Poor hygiene  Behavior Characteristics Cooperative  Mood Depressed  Thought Process  Coherency WDL  Content Blaming others  Delusions WDL  Perception WDL  Hallucination None reported or observed  Judgment Poor  Confusion None  Danger to Self  Current suicidal ideation? Passive  Self-Injurious Behavior No self-injurious ideation or behavior indicators observed or expressed   Agreement Not to Harm Self Yes (Verbally)  Description of Agreement Verbal  Danger to Others  Danger to Others None reported or observed

## 2020-12-12 NOTE — Progress Notes (Signed)
Pt did not attend psycho-ed group. 

## 2020-12-13 ENCOUNTER — Encounter (HOSPITAL_COMMUNITY): Payer: Self-pay | Admitting: Psychiatry

## 2020-12-13 DIAGNOSIS — Z720 Tobacco use: Secondary | ICD-10-CM

## 2020-12-13 DIAGNOSIS — F1410 Cocaine abuse, uncomplicated: Secondary | ICD-10-CM

## 2020-12-13 DIAGNOSIS — F319 Bipolar disorder, unspecified: Secondary | ICD-10-CM

## 2020-12-13 DIAGNOSIS — F1010 Alcohol abuse, uncomplicated: Secondary | ICD-10-CM

## 2020-12-13 DIAGNOSIS — E785 Hyperlipidemia, unspecified: Secondary | ICD-10-CM

## 2020-12-13 DIAGNOSIS — I2510 Atherosclerotic heart disease of native coronary artery without angina pectoris: Secondary | ICD-10-CM

## 2020-12-13 DIAGNOSIS — I5020 Unspecified systolic (congestive) heart failure: Secondary | ICD-10-CM

## 2020-12-13 DIAGNOSIS — E119 Type 2 diabetes mellitus without complications: Secondary | ICD-10-CM

## 2020-12-13 LAB — GLUCOSE, CAPILLARY
Glucose-Capillary: 114 mg/dL — ABNORMAL HIGH (ref 70–99)
Glucose-Capillary: 107 mg/dL — ABNORMAL HIGH (ref 70–99)

## 2020-12-13 MED ORDER — LAMOTRIGINE 25 MG PO TABS
25.0000 mg | ORAL_TABLET | Freq: Every day | ORAL | Status: DC
  Administered 2020-12-14 – 2020-12-16 (×3): 25 mg via ORAL
  Filled 2020-12-13 (×4): qty 1

## 2020-12-13 MED ORDER — LAMOTRIGINE 25 MG PO TABS
50.0000 mg | ORAL_TABLET | Freq: Every day | ORAL | Status: DC
  Filled 2020-12-13: qty 2

## 2020-12-13 NOTE — Progress Notes (Addendum)
[facility] MD Progress Note  12/13/2020 3:24 PM [name]  MRN:  [id number]  Subjective:   [name] reported " it's going okay."  [name] was seen and evaluated face-to-face by this provider.  Patient observed sitting in dayroom interacting with peers.  He presents with a bright and pleasant affect.  Denying suicidal or homicidal ideations.  Denies auditory or visual hallucinations.  Patient reports he is hopeful to discharge to [place] to a long-term treatment facility through the [place]. States if he is unable to follow-up with the residential treatment facility in [place] that he has plans to stay locally and keep follow-up outpatient appointments with [facility] in [place].  Patient is currently prescribed Cymbalta 20 mg and Abilify 5 mg, Lamictal 25 mg. Patient reported taking and tolerating meds well.  Reports he has been attending daily group sessions with active and engaged participation.  Reports a good appetite.  States she is resting well throughout the night.  Rates his depression 3 out of 10 with 10 being the worst.  Denies urges or cravings for  cocaine.  " It about the people that I am around. I know what I have to do." Support, encouragement and reassurance was provided  Principal Problem: Bipolar affective disorder, current episode depressed ([place]) Diagnosis: Principal Problem:   Bipolar affective disorder, current episode depressed ([place]) Active Problems:   HLD (hyperlipidemia)   Diabetes type 2, controlled ([place])   HFrEF (heart failure with reduced ejection fraction) ([place])   Tobacco use disorder   Coronary artery disease   S/P coronary artery stent placement (DES to LCx)   Cocaine abuse ([place])   Alcohol abuse  Total Time spent with patient: 15 minutes  Past Psychiatric History:   Past Medical History:  Past Medical History:  Diagnosis Date   Anxiety    Arthritis    Cancer ([place])    Cataract    Depression    Diabetes mellitus    GERD (gastroesophageal reflux  disease)    H/O suicide attempt 1997 and 2011   jumped out of moving vehicle and attempted to jump into moving traffic   High cholesterol    Hypertension    Neuropathic pain    Osteoporosis     Past Surgical History:  Procedure Laterality Date   COLONOSCOPY     CORONARY STENT INTERVENTION N/A 10/10/2020   Procedure: CORONARY STENT INTERVENTION;  Surgeon: Jettie Booze, MD;  Location: [facility];  Service: Cardiovascular;  Laterality: N/A;   LEFT HEART CATH AND CORONARY ANGIOGRAPHY N/A 10/10/2020   Procedure: LEFT HEART CATH AND CORONARY ANGIOGRAPHY;  Surgeon: Jettie Booze, MD;  Location: [facility];  Service: Cardiovascular;  Laterality: N/A;   NEPHRECTOMY     due to cancer   RIGHT/LEFT HEART CATH AND CORONARY ANGIOGRAPHY N/A 10/09/2020   Procedure: RIGHT/LEFT HEART CATH AND CORONARY ANGIOGRAPHY;  Surgeon: Nelva Bush, MD;  Location: [facility];  Service: Cardiovascular;  Laterality: N/A;   Family History:  Family History  Problem Relation Age of Onset   Alcohol abuse Sister    Alcohol abuse Brother    Colon cancer Neg Hx    Esophageal cancer Neg Hx    Pancreatic cancer Neg Hx    Rectal cancer Neg Hx    Stomach cancer Neg Hx    Family Psychiatric  History:  Social History:  Social History   Substance and Sexual Activity  Alcohol Use Yes   Alcohol/week: 3.0 standard drinks   Types: 3  Cans of beer per week   Comment: Pt has been clean for 8 months     Social History   Substance and Sexual Activity  Drug Use Yes   Types: "Crack" cocaine, Cocaine    Social History   Socioeconomic History   Marital status: Single    Spouse name: Not on file   Number of children: Not on file   Years of education: Not on file   Highest education level: Not on file  Occupational History   Not on file  Tobacco Use   Smoking status: Every Day    Packs/day: 0.50    Years: 20.00    Pack years: 10.00    Types: Cigarettes   Smokeless tobacco:  Never  Vaping Use   Vaping Use: Never used  Substance and Sexual Activity   Alcohol use: Yes    Alcohol/week: 3.0 standard drinks    Types: 3 Cans of beer per week    Comment: Pt has been clean for 8 months   Drug use: Yes    Types: "Crack" cocaine, Cocaine   Sexual activity: Yes    Birth control/protection: Condom  Other Topics Concern   Not on file  Social History Narrative   Lives alone.  Has sister in area.   Social Determinants of Health   Financial Resource Strain: Not on file  Food Insecurity: Not on file  Transportation Needs: Not on file  Physical Activity: Not on file  Stress: Not on file  Social Connections: Not on file   Additional Social History:                         Sleep: Fair  Appetite:  Fair  Current Medications: Current Facility-Administered Medications  Medication Dose Route Frequency Provider Last Rate Last Admin   acetaminophen (TYLENOL) tablet 650 mg  650 mg Oral Q6H PRN Niel Hummer, NP       albuterol (VENTOLIN HFA) 108 (90 Base) MCG/ACT inhaler 1-2 puff  1-2 puff Inhalation Q6H PRN France Ravens, MD   2 puff at 12/13/20 0812   alum & mag hydroxide-simeth (MAALOX/MYLANTA) 200-200-20 MG/5ML suspension 30 mL  30 mL Oral Q4H PRN Niel Hummer, NP       ARIPiprazole (ABILIFY) tablet 5 mg  5 mg Oral Daily Nelda Marseille, Rosmarie Esquibel E, MD   5 mg at 12/13/20 0813   aspirin EC tablet 81 mg  81 mg Oral Daily Elmarie Shiley A, NP   81 mg at 12/13/20 0810   benzonatate (TESSALON) capsule 100 mg  100 mg Oral TID PRN Harlow Asa, MD       clopidogrel (PLAVIX) tablet 75 mg  75 mg Oral Daily Elmarie Shiley A, NP   75 mg at 12/13/20 0810   dextromethorphan-guaiFENesin (MUCINEX DM) 30-600 MG per 12 hr tablet 1 tablet  1 tablet Oral BID PRN Harlow Asa, MD   1 tablet at 12/10/20 1706   doxycycline (VIBRA-TABS) tablet 100 mg  100 mg Oral Q12H Bertrum Helmstetter E, MD   100 mg at 12/13/20 0816   DULoxetine (CYMBALTA) DR capsule 20 mg  20 mg Oral Daily Elmarie Shiley  A, NP   20 mg at 12/13/20 0810   feeding supplement (GLUCERNA SHAKE) (GLUCERNA SHAKE) liquid 237 mL  237 mL Oral TID BM Massengill, Ovid Curd, MD   237 mL at 12/12/20 1438   gabapentin (NEURONTIN) capsule 200 mg  200 mg Oral TID France Ravens, MD  200 mg at 12/13/20 1218   hydrOXYzine (ATARAX/VISTARIL) tablet 25 mg  25 mg Oral Q6H PRN Harlow Asa, MD       insulin aspart (novoLOG) injection 0-9 Units  0-9 Units Subcutaneous TID WC Nelda Marseille, Princesa Willig E, MD       lamoTRIgine (LAMICTAL) tablet 25 mg  25 mg Oral Daily Nelda Marseille, Eliza Grissinger E, MD   25 mg at 12/13/20 3267   loperamide (IMODIUM) capsule 2-4 mg  2-4 mg Oral PRN Harlow Asa, MD       LORazepam (ATIVAN) tablet 1 mg  1 mg Oral Q6H PRN Nelda Marseille, Deyvi Bonanno E, MD       magnesium hydroxide (MILK OF MAGNESIA) suspension 30 mL  30 mL Oral Daily PRN Niel Hummer, NP       metFORMIN (GLUCOPHAGE-XR) 24 hr tablet 1,000 mg  1,000 mg Oral Q supper Elmarie Shiley A, NP   1,000 mg at 12/12/20 1711   metFORMIN (GLUCOPHAGE-XR) 24 hr tablet 500 mg  500 mg Oral Q breakfast Elmarie Shiley A, NP   500 mg at 12/13/20 0809   metoprolol succinate (TOPROL-XL) 24 hr tablet 25 mg  25 mg Oral Daily Elmarie Shiley A, NP   25 mg at 12/13/20 0810   multivitamin with minerals tablet 1 tablet  1 tablet Oral Daily Harlow Asa, MD   1 tablet at 12/13/20 0810   nicotine (NICODERM CQ - dosed in mg/24 hours) patch 14 mg  14 mg Transdermal Daily PRN Harlow Asa, MD       nicotine polacrilex (NICORETTE) gum 2 mg  2 mg Oral PRN Massengill, Ovid Curd, MD       ondansetron (ZOFRAN-ODT) disintegrating tablet 4 mg  4 mg Oral Q6H PRN Harlow Asa, MD       pantoprazole (PROTONIX) EC tablet 40 mg  40 mg Oral Daily Elmarie Shiley A, NP   40 mg at 12/13/20 0810   rosuvastatin (CRESTOR) tablet 40 mg  40 mg Oral Daily Massengill, Ovid Curd, MD   40 mg at 12/13/20 0809   thiamine tablet 100 mg  100 mg Oral Daily Nelda Marseille, Shealee Yordy E, MD   100 mg at 12/13/20 0809   traZODone (DESYREL) tablet 50 mg  50 mg  Oral QHS PRN Niel Hummer, NP   50 mg at 12/12/20 2127    Lab Results:  Results for orders placed or performed during the hospital encounter of 12/08/20 (from the past 48 hour(s))  Glucose, capillary     Status: Abnormal   Collection Time: 12/11/20  4:46 PM  Result Value Ref Range   Glucose-Capillary 107 (H) 70 - 99 mg/dL    Comment: Glucose reference range applies only to samples taken after fasting for at least 8 hours.  Resp Panel by RT-PCR (Flu A&B, Covid) Nasopharyngeal Swab     Status: None   Collection Time: 12/11/20  6:13 PM   Specimen: Nasopharyngeal Swab; Nasopharyngeal(NP) swabs in vial transport medium  Result Value Ref Range   SARS Coronavirus 2 by RT PCR NEGATIVE NEGATIVE    Comment: (NOTE) SARS-CoV-2 target nucleic acids are NOT DETECTED.  The SARS-CoV-2 RNA is generally detectable in upper respiratory specimens during the acute phase of infection. The lowest concentration of SARS-CoV-2 viral copies this assay can detect is 138 copies/mL. A negative result does not preclude SARS-Cov-2 infection and should not be used as the sole basis for treatment or other patient management decisions. A negative result may occur with  improper specimen collection/handling,  submission of specimen other than nasopharyngeal swab, presence of viral mutation(s) within the areas targeted by this assay, and inadequate number of viral copies(<138 copies/mL). A negative result must be combined with clinical observations, patient history, and epidemiological information. The expected result is Negative.  Fact Sheet for Patients:  EntrepreneurPulse.com.au  Fact Sheet for Healthcare Providers:  IncredibleEmployment.be  This test is no t yet approved or cleared by the [place] FDA and  has been authorized for detection and/or diagnosis of SARS-CoV-2 by FDA under an Emergency Use Authorization (EUA). This EUA will remain  in effect (meaning this  test can be used) for the duration of the COVID-19 declaration under Section 564(b)(1) of the Act, 21 U.S.C.section 360bbb-3(b)(1), unless the authorization is terminated  or revoked sooner.       Influenza A by PCR NEGATIVE NEGATIVE   Influenza B by PCR NEGATIVE NEGATIVE    Comment: (NOTE) The Xpert Xpress SARS-CoV-2/FLU/RSV plus assay is intended as an aid in the diagnosis of influenza from Nasopharyngeal swab specimens and should not be used as a sole basis for treatment. Nasal washings and aspirates are unacceptable for Xpert Xpress SARS-CoV-2/FLU/RSV testing.  Fact Sheet for Patients: EntrepreneurPulse.com.au  Fact Sheet for Healthcare Providers: IncredibleEmployment.be  This test is not yet approved or cleared by the [place] FDA and has been authorized for detection and/or diagnosis of SARS-CoV-2 by FDA under an Emergency Use Authorization (EUA). This EUA will remain in effect (meaning this test can be used) for the duration of the COVID-19 declaration under Section 564(b)(1) of the Act, 21 U.S.C. section 360bbb-3(b)(1), unless the authorization is terminated or revoked.  Performed at [facility], [place] [address], [place], [place] [address]   Glucose, capillary     Status: Abnormal   Collection Time: 12/12/20  5:46 AM  Result Value Ref Range   Glucose-Capillary 114 (H) 70 - 99 mg/dL    Comment: Glucose reference range applies only to samples taken after fasting for at least 8 hours.   Comment 1 Notify RN   Glucose, capillary     Status: Abnormal   Collection Time: 12/12/20 11:54 AM  Result Value Ref Range   Glucose-Capillary 149 (H) 70 - 99 mg/dL    Comment: Glucose reference range applies only to samples taken after fasting for at least 8 hours.  Glucose, capillary     Status: Abnormal   Collection Time: 12/12/20  5:07 PM  Result Value Ref Range   Glucose-Capillary 138 (H) 70 - 99 mg/dL    Comment:  Glucose reference range applies only to samples taken after fasting for at least 8 hours.  Glucose, capillary     Status: Abnormal   Collection Time: 12/13/20  5:47 AM  Result Value Ref Range   Glucose-Capillary 114 (H) 70 - 99 mg/dL    Comment: Glucose reference range applies only to samples taken after fasting for at least 8 hours.  Glucose, capillary     Status: Abnormal   Collection Time: 12/13/20 12:17 PM  Result Value Ref Range   Glucose-Capillary 107 (H) 70 - 99 mg/dL    Comment: Glucose reference range applies only to samples taken after fasting for at least 8 hours.    Blood Alcohol level:  Lab Results  Component Value Date   [facility] <10 12/05/2020   ETH <5 39/04/90    Metabolic Disorder Labs: Lab Results  Component Value Date   HGBA1C 6.8 (H) 12/10/2020   MPG 148.46 12/10/2020   MPG  188.64 10/10/2020   No results found for: PROLACTIN Lab Results  Component Value Date   CHOL 177 12/10/2020   TRIG 124 12/10/2020   HDL 50 12/10/2020   CHOLHDL 3.5 12/10/2020   VLDL 25 12/10/2020   LDLCALC 102 (H) 12/10/2020   LDLCALC 82 10/10/2020    Physical Findings: AIMS:  , ,  ,  ,    CIWA:  CIWA-Ar Total: 0 COWS:     Musculoskeletal: Strength & Muscle Tone: within normal limits Gait & Station: normal Patient leans: N/A  Psychiatric Specialty Exam:  Presentation  General Appearance: Appropriate for Environment; Casual  Eye Contact:Fair  Speech:Clear and Coherent; Normal Rate  Speech Volume:Normal  Handedness:Right   Mood and Affect  Mood:described as improving- more euthymic appearing  Affect:Congruent   Thought Process  Thought Processes:Coherent; Linear  Descriptions of Associations:Intact  Orientation:Full (Time, Place and Person)  Thought Content:Logical  History of Schizophrenia/Schizoaffective disorder:No  Duration of Psychotic Symptoms:No data recorded Hallucinations:Denied Ideas of Reference:None  Suicidal Thoughts:Denied Homicidal  Thoughts:Denied  Sensorium  Memory:Immediate Good; Recent Good; Remote Fair  Judgment:Fair  Insight:Fair   Executive Functions  Concentration:Fair  Attention Span:Fair  [place]   Psychomotor Activity  Psychomotor Activity:Normal  Assets  Assets:Desire for Improvement; Armed forces logistics/support/administrative officer; Leisure Time; Resilience; Vocational/Educational   Sleep  Sleep:6.75  Physical Exam Vitals and nursing note reviewed.  Pulmonary:     Effort: Pulmonary effort is normal.  Neurological:     Mental Status: He is alert.  Psychiatric:        Mood and Affect: Mood normal.        Thought Content: Thought content normal.   Review of Systems  Respiratory:  Positive for cough.   Psychiatric/Behavioral:  Negative for suicidal ideas.   Blood pressure 100/69, pulse (!) 107, temperature 99.6 F (37.6 C), temperature source Oral, resp. rate 18, height 5\' 10"  (1.778 m), weight 73.5 kg, SpO2 98 %. Body mass index is 23.24 kg/m.   Treatment Plan Summary: Daily contact with patient to assess and evaluate symptoms and progress in treatment and Medication management   Bipolar d/o MRE depressed severe (r/o with psychotic features) (r/o substance induced mood d/o) -- Continue Cymbalta 20mg  daily for depression, watch for signs of mood escalation -- Continue Abilify 5mg  daily for mood stabilization and potential delusions (Lipid panel WNL except for LDL 102; A1c 6.8, QTC 419ms)              -- Continue Lamictal 25mg  daily, retitrating to previous home dose over time given recent medication noncompliance             -- Encouraged patient to participate in unit milieu and in scheduled group therapies                          Stimulant use d/o - cocaine type Alcohol use d/o -- Continue CIWA protocol with Ativan 1mg  for scores >10 with MVI and thiamine oral replacement (recent scores 0,0) -- Has PRNs available for symptomatic treatment for possible  withdrawal -- Continue Neurontin 200mg  tid for help with potential cravings, withdrawal and anxiety as well as chronic pain -- Encouraged residential rehab options after discharge and he is working with SW on options and referrals             Cough (r/o bronchitis) -- Chest x-ray (10/28): mild central bronchial thickening -- Temp 99.6 this morning - continue Doxycycline 100mg  bid for  7 days and monitor -- Repeat Covid Test negative -- Albuterol MDI PRN -- Continue Tessalon perles PRN and Mucinex PRN -- Sats 98-99% RA   Tobacco Use Disorder             -- Nicotine patch 14mg /24 hours ordered             -- Smoking cessation encouraged               DM type II             -- Continue Metformin 1000mg  qhs and 500mg  qam             -- SSI coverage with meals and ACHS FSBS (patient continues to refuse)               CHF CAD s/p stent Hyperlipidemia -- Continue ASA 81mg  daily and Plavix 75mg  daily -- Continue Crestor 40mg  daily -- Continue Metoprolol XL 25mg  daily  -- Consulted medicine about potential restart of Jardiance vs. Entresto for CHF management recommendations and at this time will continue to hold both until patient can f/u with outpatient cardiology. -- EKG 10/26 Normal sinus rhythm, evidence of bilateral atrial enlargement, LVH, nonspecific T wave abnormality, cannot r/o inferior infarct age undetermined - no acute changes since last tracing -  patient currently asymptomatic               Chronic knee pain             -- Continue Neurontin 200mg  tid.              -- Tylenol PRN   CSW to continue with discharge disposition - patient was encouraged to participate with the therapeutic milieu    Derrill [place], NP 12/13/2020, 3:24 PM  I have reviewed the patient's chart and discussed the case with the APP. I agree with the assessment and plan of care as documented in the APP's note.  Viann Fish, MD, Alda Ponder

## 2020-12-13 NOTE — [facility] Group Notes (Signed)
Psychoeducational Group Note  Date: 12-13-20 Time:  1300  Group Topic/Focus:  Making Healthy Choices:   The focus of this group is to help patients identify negative/unhealthy choices they were using prior to admission and identify positive/healthier coping strategies to replace them upon discharge.In this group.   Participation Level:  Active  Participation Quality:  Appropriate  Affect:  Appropriate  Cognitive:  Oriented  Insight:  Improving  Engagement in Group:  Engaged  Additional Comments:. Pt alsways states that he gets a lot out of the groups.  Paulino Rily

## 2020-12-13 NOTE — Progress Notes (Signed)
DAR NOTE: Patient presents with a calm affect and mood.  Denies suicidal thoughts, auditory and visual hallucinations.  Described energy level as normal with good concentration.  Rates depression at 2, hopelessness at 2, and anxiety at 2.  Maintained on routine safety checks.  Medications given as prescribed.  Support and encouragement offered as needed.  Attended group and participated.  States goal for today is "thinking positive as usual."  Patient observed socializing with peers in the dayroom.  Offered no complaint.  Patient is safe on and off the unit.

## 2020-12-13 NOTE — [facility] Group Notes (Signed)
.  Psychoeducational Group Note    Date:12/13/2020 Time: 1300-1400    Purpose of Group: . The group focus' on teaching patients on how to identify their needs and how Life Skills:  A group where two lists are made. What people need and what are things that we do that are healthy. The lists are developed by the patients and it is explained that we often do the actions that are not healthy to get our list of needs met.  to develop the coping skills needed to get their needs met  Participation Level:  Active  Participation Quality:  Appropriate  Affect:  Appropriate  Cognitive:  Oriented  Insight:  Improving  Engagement in Group:  Engaged  Additional Comments: Participated fully in the group. Rates his energy at a 5/10  Bryson Dames A

## 2020-12-13 NOTE — Group Note (Signed)
LCSW Group Therapy Note  Group Date: 12/13/2020 Start Time: 1005 End Time: 1105   Type of Therapy and Topic:  Group Therapy: Anger Cues and Responses  Participation Level:  Active   Description of Group:   In this group, patients learned how to recognize the physical, cognitive, emotional, and behavioral responses they have to anger-provoking situations.  They identified a recent time they became angry and how they reacted.  They analyzed how their reaction was possibly beneficial and how it was possibly unhelpful.  The group discussed a variety of healthier coping skills that could help with such a situation in the future.  Focus was placed on how helpful it is to recognize the underlying emotions to our anger, because working on those can lead to a more permanent solution as well as our ability to focus on the important rather than the urgent.  Therapeutic Goals: Patients will remember their last incident of anger and how they felt emotionally and physically, what their thoughts were at the time, and how they behaved. Patients will identify how their behavior at that time worked for them, as well as how it worked against them. Patients will explore possible new behaviors to use in future anger situations. Patients will learn that anger itself is normal and cannot be eliminated, and that healthier reactions can assist with resolving conflict rather than worsening situations.  Summary of Patient Progress:  [name] was active during the group. He shared a recent occurrence wherein feeling threatened led to anger. He felt positive about his healthy coping skill used, because he talked to other people about what was happening in order to process the situation rather than becoming very angry with the people threatening him.  He demonstrated fair insight into the subject matter, was respectful of peers, and participated throughout the entire session.  He did have some side conversations and left the  room at one point for a length of time.  Therapeutic Modalities:   Cognitive Behavioral Therapy    [name], LCSWA 12/13/2020  12:07 PM

## 2020-12-13 NOTE — Progress Notes (Signed)
Pt stated he was doing better, pt visible on the unit interacting with peers and staff, pt requested Flu shot that was ordered    12/13/20 2300  Psych Admission Type (Psych Patients Only)  Admission Status Voluntary  Psychosocial Assessment  Patient Complaints Depression  Eye Contact Fair  Facial Expression Flat  Affect Appropriate to circumstance  Speech Logical/coherent;Soft  Interaction Assertive  Motor Activity Slow  Appearance/Hygiene Poor hygiene;Body odor  Behavior Characteristics Cooperative  Mood Pleasant  Aggressive Behavior  Effect No apparent injury  Thought Process  Coherency WDL  Content Blaming others  Delusions WDL  Perception WDL  Hallucination None reported or observed  Judgment Poor  Confusion None  Danger to Self  Current suicidal ideation? Denies  Self-Injurious Behavior No self-injurious ideation or behavior indicators observed or expressed   Agreement Not to Harm Self Yes (Verbally)  Description of Agreement Verbal  Danger to Others  Danger to Others None reported or observed

## 2020-12-13 NOTE — [facility] Group Notes (Signed)
.  Psychoeducational Group Note  Date: 12/11/2020 Time: 0900-1000    Goal Setting   Purpose of Group: This group helps to provide patients with the steps of setting a goal that is specific, measurable, attainable, realistic and time specific. A discussion on how we keep ourselves stuck with negative self talk.    Participation Level:  Active  Participation Quality:  Appropriate  Affect:  Appropriate  Cognitive:  Appropriate  Insight:  Improving  Engagement in Group:  Engaged  Additional Comments:  Rates his energy at a 7/10. States he is here for SI and HI. Was  directing his anger at others. States he is glad that he is here because he doesn't want to do anything that would land him in jail. Given the homework of writing 309 positives about himself  [name]

## 2020-12-13 NOTE — Progress Notes (Signed)
Adult Psychoeducational Group Note  Date:  12/13/2020 Time:  11:22 PM  Group Topic/Focus:  Wrap-Up Group:   The focus of this group is to help patients review their daily goal of treatment and discuss progress on daily workbooks.  Participation Level:  Active  Participation Quality:  Appropriate  Affect:  Appropriate  Cognitive:  Appropriate  Insight: Good  Engagement in Group:  Engaged  Modes of Intervention:  Discussion  Additional Comments:  Pt rates his day 7/10. Says is only negative is the back to back groups with no time for reflection. Pt says he wants to go to a 2-year program at a treatment facility in [place]. He said that his VA benefits may require a face-to-face interview to agree to payment for the facility so he may have to be discharged to the [facility] first. He knows he will use drugs and drink again if he doesn't get into a long term program and his goal is to be clean for life.   [name] A [name] 12/13/2020, 11:22 PM

## 2020-12-14 DIAGNOSIS — F3130 Bipolar disorder, current episode depressed, mild or moderate severity, unspecified: Secondary | ICD-10-CM | POA: Diagnosis not present

## 2020-12-14 LAB — GLUCOSE, CAPILLARY
Glucose-Capillary: 139 mg/dL — ABNORMAL HIGH (ref 70–99)
Glucose-Capillary: 105 mg/dL — ABNORMAL HIGH (ref 70–99)

## 2020-12-14 MED ORDER — INFLUENZA VAC SPLIT QUAD 0.5 ML IM SUSY
0.5000 mL | PREFILLED_SYRINGE | INTRAMUSCULAR | Status: AC
  Administered 2020-12-15: 0.5 mL via INTRAMUSCULAR
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
## 2020-12-14 NOTE — BHH Group Notes (Signed)
[facility] Group Notes:  (Nursing/MHT/Case Management/Adjunct)  Date:  12/14/2020  Time:  5:03 PM  Type of Therapy:  Music Therapy  Participation Level:  Active  Participation Quality:  Appropriate  Affect:  Appropriate  Cognitive:  Appropriate  Insight:  Appropriate  Engagement in Group:  Engaged  Modes of Intervention:  Activity  Summary of Progress/Problems: Patient stated being involved in the sing-a-long was rewarding, as he was bored.  Jerrye Beavers 12/14/2020, 5:03 PM
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
## 2020-12-14 NOTE — Progress Notes (Signed)
Pt stated he does not usually take insulin, but said he would take insulin if his CBG goes above 150

## 2020-12-14 NOTE — Group Note (Signed)
Group Topic: Goal Setting  Group Date: 12/14/2020 Start Time: 0900 End Time: 1000 Facilitators: Kathreen Cornfield  Department: [facility]  Number of Participants: 6  Group Focus: goals/reality orientation Treatment Modality:  Leisure Development Interventions utilized were support Purpose: orientation of unit and goal setting  Name: [name] Date of Birth: [birth year]  MR: [id number]    Level of Participation: moderate Quality of Participation: engaged Interactions with others: appropriate Mood/Affect: appropriate Triggers (if applicable): N/A Cognition:  Progress: Moderate Response:  Plan: patient will be encouraged to work on goals  Patients Problems:  Patient Active Problem List   Diagnosis Date Noted   Alcohol abuse 12/09/2020   Homicidal ideation 12/06/2020   Cocaine abuse ([place]) 12/06/2020   S/P coronary artery stent placement (DES to LCx) 10/11/2020   Pleural effusion due to congestive heart failure ([place]) 01/11/2020   Aortic atherosclerosis ([place]) 01/11/2020   Coronary artery disease 01/11/2020   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   Orthostatic hypotension 01/10/2020   HFrEF (heart failure with reduced ejection fraction) ([place]) 01/06/2020   Tobacco use disorder 01/06/2020   Obesity 01/06/2020   Bilateral lower extremity edema 01/04/2020   Shortness of breath 01/04/2020   MDD (major depressive disorder), recurrent severe, without psychosis ([place]) 10/09/2016   Diabetes type 2, controlled ([place]) 01/01/2015   Erectile dysfunction 01/01/2015   Healthcare maintenance 01/01/2015   Bipolar 1 disorder, depressed ([place]) 07/23/2013   Bipolar disorder ([place]) 04/03/2013   MDD (major depressive disorder), recurrent, severe, with psychosis ([place]) 11/04/2012   Suicidal ideation 11/04/2012   Post traumatic stress disorder (PTSD) 11/04/2012   CARCINOMA, RENAL CELL 06/05/2008   HLD (hyperlipidemia) 06/05/2008   Bipolar affective disorder, current episode  depressed ([place]) 06/05/2008   Essential hypertension 06/05/2008   CHEST PAIN 06/05/2008

## 2020-12-14 NOTE — BHH Group Notes (Signed)
participated and contributed to wrap up group

## 2020-12-15 ENCOUNTER — Encounter (HOSPITAL_COMMUNITY): Payer: Self-pay | Admitting: Psychiatry

## 2020-12-15 ENCOUNTER — Encounter (HOSPITAL_COMMUNITY): Payer: Self-pay

## 2020-12-15 DIAGNOSIS — F3130 Bipolar disorder, current episode depressed, mild or moderate severity, unspecified: Secondary | ICD-10-CM | POA: Diagnosis not present

## 2020-12-15 LAB — GLUCOSE, CAPILLARY: Glucose-Capillary: 121 mg/dL — ABNORMAL HIGH (ref 70–99)

## 2020-12-15 NOTE — Group Note (Signed)
LCSW Group Therapy Note   Group Date: 12/15/2020 Start Time: 1300 End Time: 1400   Type of Therapy and Topic:  Group Therapy:  Fear  Participation Level:  Active  Description of Group: In this process group, patients discussed things that make feel them feel nervous or scared.  Patients discussed what they think about when they feel this emotions.  Patients were given the opportunity to share openly and support each others.  The group discussed where they feel their fears in their body.  Patients were encouraged to identify new coping mechanisms to use when fears arise.  Therapeutic Goals Patient will verbalize fears and what thoughts occur when they feel this emotion. Patients will verbalize where on their body they feel fear. Patients will explore coping mechanisms they can use in the future when fear arises.   Summary of Patient Progress:  Pt attended group and stated that they are afraid of thinking about "what if things don't happen".  The Pt participated appropriately and shared openly during discussion.  The Pt accepted the worksheet that was provided and remained on topic.    Therapeutic Modalities Cognitive Behavioral Therapy Motivational Interviewing  Darleen Crocker, [place] 12/15/2020  1:55 PM

## 2020-12-15 NOTE — Progress Notes (Signed)
Adult Psychoeducational Group Note  Date:  12/15/2020 Time:  11:33 AM  Group Topic/Focus:  Goals Group:   The focus of this group is to help patients establish daily goals to achieve during treatment and discuss how the patient can incorporate goal setting into their daily lives to aide in recovery.  Participation Level:  Active  Participation Quality:  Appropriate  Affect:  Appropriate  Cognitive:  Appropriate  Insight: Appropriate  Engagement in Group:  Engaged  Modes of Intervention:  Discussion and Problem-solving  Additional Comments:  Pt state his goal for the day is to talk to doctor.  Tonia Brooms D 12/15/2020, 11:33 AM

## 2020-12-15 NOTE — BHH Group Notes (Signed)
[facility] Group Notes:  (Nursing/MHT/Case Management/Adjunct)  Date:  12/15/2020  Time:  10:42 PM  Type of Therapy:  Group Therapy  Participation Level:  Active  Participation Quality:  Appropriate  Affect:  Appropriate  Cognitive:  Appropriate  Insight:  Appropriate  Engagement in Group:  Developing/Improving  Modes of Intervention:  Education  Summary of Progress/Problems:The patient attended the [place] meeting and was appropriate.   Archie Balboa S 12/15/2020, 10:42 PM

## 2020-12-15 NOTE — Progress Notes (Signed)
Patient in bed at beginning of shift. Up for group and medication. Denies any SI, HI, AVH. Pleasant and cooperative. Medication compliant. Appropriate with staff and peers. No other concerns voiced   Encouragement and support provided. Safety checks maintained. Medications given as prescribed. Pt receptive and remains safe on unit with q 15 min checks.

## 2020-12-15 NOTE — Group Note (Signed)
Occupational Therapy Group Note  Group Topic:Feelings Management  Group Date: 12/15/2020 Start Time: 1400 End Time: 1445 Facilitators: Ponciano Ort, OT/L    Group Description: Group encouraged increased participation and engagement through discussion focused on STRENGTHS. Patients were encouraged to fill out a worksheet to structure discussion, identifying ten strengths that they currently have. Patients were then instructed to utilize various symbols to identify which strengths help them in their relationships, school/profession, and leisure participation. Discussion also focused on identifying one strength they do not currently have but would like. Discussion within the group followed with patients sharing their responses and highlighting their own personal strengths.   Therapeutic Goals: Identify strengths vs weaknesses Discuss and identify ways we can highlight our strengths  Participation Level: Active   Participation Quality: Independent   Behavior: Calm, Cooperative, and Interactive    Speech/Thought Process: Focused   Affect/Mood: Euthymic   Insight: Fair   Judgement: Fair   Individualization: [place] was active in their participation of group discussion/activity. Pt identified "acceptance and people" as something that gives him strength. Appeared receptive to education and engaging in activity for duration.   Modes of Intervention: Activity, Discussion, and Education  Patient Response to Interventions:  Attentive, Engaged, Interested , and Receptive   Plan: Continue to engage patient in OT groups 2 - 3x/week.  12/15/2020  Ponciano Ort, OT/L

## 2020-12-15 NOTE — Progress Notes (Addendum)
CSW called patient [facility] social worker, Ms. [name], regarding approval for community services.  CSW had to leave a message for a call back. CSW left a HIPPA compliant voicemail.   Addendum: Ms. [name] called this social worker back and left a message.  She directed social worker to call mental health social worker at [phone] ext: [phone].  CSW called and left a HIPPA compliant voicemail regarding patient and ability to get approved for residential treatment.   Ms. [name] also called back again and stated that the [facility] Provider, request for service needed to be sent to patient PCP: Dr. Dani Gobble, Ms. [name] provided CSW with patient primary care provider fax: [phone]. CSW faxed request to patient PCP.    [name], LCSW, [place] Social Worker  [facility]
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
## 2020-12-15 NOTE — Progress Notes (Addendum)
[facility] Progress Note   12/15/2020 11:14 AM [name]  MRN:  [id number]   Reason for Admission: [name] is a 65 y.o. male with a history of bipolar d/o, who was initially admitted for inpatient psychiatric hospitalization on 12/08/2020 for management of SI and HI in the context of cocaine and alcohol use. The patient is currently on Hospital Day 7.   Chart Review from last 24 hours:  The patient's chart was reviewed and nursing notes were reviewed. The patient's case was discussed in multidisciplinary team meeting. Per nursing [name] attended groups and had no acute behavioral or safety issues noted. Per [facility] [name] was compliant with scheduled medications and required Trazodone PRN X1 for sleep.  Information Obtained Today During Patient Interview: [name] was seen in the presence of Dr. Nelda Marseille and chart was reviewed. [name], "I feel a whole lot better. I have 0 anxiety and 0 depression. Y'all paved the way with the medications and have me on the right track." [name] presents alert & oriented x 4 and was in group this morning. [name] still has a cough, but [name] says the puffer [name] was given works well. [name] denies CP or SOB and voices no other physical comlaints. [name] [name] good sleep and appetite. [name] was told we are still waiting for social work to determine the exact plan for discharge depending or response from [place] about residential rehab referral. If the [facility] [place] is able to be coordinated, [name] will go there, but if not, [name] will go to [facility] with outpatient follow up at the [place] for [place] or residential rehab referral from the [place]. [name] is optimistic and is in agreement to continue current plan of care in progress. [name] denies SI, HI, AVH, paranoia or delusions. [name] [name] that his mood is iproved and stable and [name] denies medication side-effects.  Principal Problem: Bipolar d/o current episode depressed   Diagnosis: Active Problems: Bipolar d/o current episode  depressed DM type II CHF Tobacco use d/o CAD s/p stent Cocaine abuse Alcohol abuse Hyperlipidemia   Total Time Spent in Direct Patient Care:  I personally spent 25 minutes on the unit in direct patient care. The direct patient care time included face-to-face time with the patient, reviewing the patient's chart, communicating with other professionals, and coordinating care. Greater than 50% of this time was spent in counseling or coordinating care with the patient regarding goals of hospitalization, psycho-education, and discharge planning needs.  Past Psychiatric History: Patient was told [name] had bipolar disorder in 1991 but at one point was told [name] could have PTSD or schizophrenia. [name] has had psychiatric hospitalizations at [facility] but does not recall dates of treatments. [name] currently receives psychiatric care through the [place]. Patient says [name] started receiving care at the [place] in the 1990s. [name] started in [place], [place], but has also received care in [place], other cities in [place], and various cities in [place] and [place]. According to EHR [name] previously reported suicide attempts via jumping into traffic and jumping from a moving vehicle. [name] has been on various medications in the past including Zoloft, Prozac, and Haldol. Per records, [name] has also previously been on Neurontin, Abilify, Lamictal, and Cymbalta.  Past Medical History:        Past Medical History:  Diagnosis Date   Anxiety     Arthritis     Cancer ([place])     Cataract     Depression     Diabetes mellitus  GERD (gastroesophageal reflux disease)     H/O suicide attempt 1997 and 2011    jumped out of moving vehicle and attempted to jump into moving traffic   High cholesterol     Hypertension     Neuropathic pain     Osteoporosis     CAD s/p stent CHF s/p paracentesis        Past Surgical History:  Procedure Laterality Date   COLONOSCOPY       CORONARY STENT INTERVENTION N/A 10/10/2020    Procedure: CORONARY  STENT INTERVENTION;  Surgeon: Jettie Booze, MD;  Location: [place] [facility];  Service: Cardiovascular;  Laterality: N/A;   LEFT HEART CATH AND CORONARY ANGIOGRAPHY N/A 10/10/2020    Procedure: LEFT HEART CATH AND CORONARY ANGIOGRAPHY;  Surgeon: Jettie Booze, MD;  Location: [place] [facility];  Service: Cardiovascular;  Laterality: N/A;   NEPHRECTOMY        due to cancer   RIGHT/LEFT HEART CATH AND CORONARY ANGIOGRAPHY N/A 10/09/2020    Procedure: RIGHT/LEFT HEART CATH AND CORONARY ANGIOGRAPHY;  Surgeon: Nelva Bush, MD;  Location: [place] [facility];  Service: Cardiovascular;  Laterality: N/A;    Family History:  Patient says his mother and sister have had "bad leg swelling" due to fluid buildup. [name] said they ate a lot of soul food together.        Family History  Problem Relation Age of Onset   Alcohol abuse Sister     Alcohol abuse Brother     Colon cancer Neg Hx     Esophageal cancer Neg Hx     Pancreatic cancer Neg Hx     Rectal cancer Neg Hx     Stomach cancer Neg Hx      Family Psychiatric History: Maternal aunt had schizophrenia and committed suicide. Patient has a younger sister that had mental health problems and has been addicted to pills and cocaine.   Tobacco Screening:  Patient has smoked 1/2 pack per day since [name] was 65 years old. 23.5 pack-year smoking history.   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 Noncombat service - After serving, [name] was homeless for 30 years. While homeless, [name] worked Veterinary surgeon jobs. [name] denies legal charges.     Marital status: Single, never married Are you sexually active?: No What is your sexual orientation?: Straight Has your sexual activity been affected by drugs, alcohol, medication, or emotional stress?: No Does patient have children?: No   Allergies:       Allergies  Allergen Reactions   Morphine And Related Other (See Comments)      Pt unable to take narcotics.   Patient is not actually allergic to morphine and narcotics. [name] prefers to avoid them when possible due to his substance use history.   Medications:  Scheduled Meds:  ARIPiprazole  5 mg Oral Daily   aspirin EC  81 mg Oral Daily   clopidogrel  75 mg Oral Daily  doxycycline  100 mg Oral Q12H   DULoxetine  20 mg Oral Daily   feeding supplement (GLUCERNA SHAKE)  237 mL Oral TID BM   gabapentin  200 mg Oral TID   influenza vac split quadrivalent PF  0.5 mL Intramuscular Tomorrow-1000   insulin aspart  0-9 Units Subcutaneous TID WC   lamoTRIgine  25 mg Oral Daily   metFORMIN  1,000 mg Oral Q supper   metFORMIN  500 mg Oral Q breakfast   metoprolol succinate  25 mg Oral Daily   multivitamin with minerals  1 tablet Oral Daily   pantoprazole  40 mg Oral Daily   rosuvastatin  40 mg Oral Daily   thiamine  100 mg Oral Daily   Continuous Infusions: None PRN Meds:.acetaminophen, albuterol, alum & mag hydroxide-simeth, benzonatate, dextromethorphan-guaiFENesin, hydrOXYzine, loperamide, LORazepam, magnesium hydroxide, nicotine, nicotine polacrilex, ondansetron, traZODone    Physical Findings:   Presentation  General Appearance: Appropriate for Environment; Casual; Fairly Groomed   Eye Contact:Good   Speech:Normal Rate; Clear and Coherent   Speech Volume:Normal   Handedness:Right   Mood and Affect  Mood:-- ("I feel a whole lot better. I have 0 anxiety and 0 depression.") euthymic  Affect: congruent, polite, calm   Thought Process  Thought Processes:Coherent; Goal Directed; Linear   Descriptions of  Associations:Intact   Orientation:Full (Time, Place and Person)   Thought Content: Normal   History of Schizophrenia/Schizoaffective disorder:No, patient says [name] was misdiagnosed previously.   Duration of Psychotic Symptoms: Greater than 2 weeks.   Hallucinations: None   Ideas of Reference: None   Suicidal Thoughts: Denied   Homicidal Thoughts: Denied   Sensorium  Memory:Immediate Good; Recent Good; Remote Fair   Judgment:Fair   Insight:Fair   Executive Functions  Concentration:Good   Attention Span:Good   [place]   Psychomotor Activity:Normal   Assets:Desire for Improvement; Armed forces logistics/support/administrative officer; Leisure Time; Resilience; Vocational/Educational; A Few Friends Who Give Sound Advice  Sleep: Patient said [name] sleeps well.    Physical Exam Vitals reviewed.  Constitutional:      General: [name] is not in acute distress.    Appearance: Normal appearance.  HENT:     Head: Normocephalic and atraumatic.  Pulmonary:     Effort: Pulmonary effort is normal. No respiratory distress.  Skin:    General: Skin is dry.  Neurological:     Mental Status: [name] is alert and oriented to person, place, and time.  Psychiatric:        Behavior: Behavior normal.   Review of Systems: Denies CP, SOB but has residual cough; denies N/V/D  Vitals:   12/15/20 0611 12/15/20 0613  BP: 126/83 129/79  Pulse: (!) 104 (!) 114  Resp:    Temp: 97.6 F (36.4 C)   SpO2: 98%     Treatment Plan Summary: Daily contact with patient to assess and evaluate symptoms and progress in treatment and Medication management.    Diagnoses:  Bipolar d/o MRE depressed severe without psychotic features R/o substance induced mood d/o Stimulant use d/o - cocaine type Alcohol use d/o Tobacco use d/o DM type II CHF CAD s/p stent Hyperlipidemia Chronic knee pain bilaterally   PLAN: Safety and Monitoring:             -- Voluntary admission to inpatient  psychiatric unit for safety, stabilization and treatment             -- Daily contact with patient to assess and evaluate symptoms  and progress in treatment             -- Patient's case to be discussed in multi-disciplinary team meeting             -- Observation Level : q15 minute checks             -- Vital signs:  q12 hours             -- Precautions: suicide, elopement, and assault   2. Psychiatric Diagnoses and Treatment:              Bipolar d/o MRE depressed severe without psychotic features (r/o substance induced mood d/o) -- Continue Cymbalta 20mg  daily for depression, watch for signs of mood escalation -- Continue Abilify 5mg  daily for mood stabilization (Lipid panel WNL except for LDL 102; A1c 6.8, QTC 427ms)              -- Continue Lamictal 25mg  daily, retitrating to previous home dose over time given recent medication noncompliance             -- Encouraged patient to participate in unit milieu and in scheduled group therapies              -- Short Term Goals: Ability to verbalize feelings will improve, Ability to disclose and discuss suicidal ideas, and Ability to demonstrate self-control will improve             -- Long Term Goals: Improvement in symptoms so as ready for discharge               Stimulant use d/o - cocaine type Alcohol use d/o -- CIWA discontinued based on scores; continue po MVI and thiamine replacement -- Continue Neurontin 200mg  tid for help with potential cravings, withdrawal and anxiety as well as chronic pain -- Encouraged residential rehab options after discharge and [name] is working with SW on options and referrals             -- Short Term Goals: Ability to identify triggers associated with substance abuse/mental health issues will improve             -- Long Term Goals: Improvement in symptoms so as ready for discharge              3. Medical Issues Being Addressed:              Cough -- Chest x-ray (10/28): mild central bronchial  thickening --Continue Doxycycline 100mg  bid for 7 days and monitor (start date 10/28) -- Repeat Covid Test negative -- Albuterol MDI PRN -- Continue Tessalon perles PRN and Mucinex PRN -- Sats 98% RA and afebrile today   Tobacco Use Disorder             -- Nicotine patch 14mg /24 hours ordered             -- Smoking cessation encouraged               DM type II             -- Continue Metformin 1000mg  qhs and 500mg  qam             -- Patient refusing SSI - continue daily FSBS                CHF CAD s/p stent Hyperlipidemia -- Continue ASA 81mg  daily and Plavix 75mg  daily -- Continue Crestor 40mg  daily -- Continue Metoprolol XL 25mg  daily  -- Consulted medicine  about potential restart of Jardiance vs. Entresto for CHF management recommendations and at this time will continue to hold both until patient can f/u with outpatient cardiology. -- EKG 10/26 Normal sinus rhythm, evidence of bilateral atrial enlargement, LVH, nonspecific T wave abnormality, cannot r/o inferior infarct age undetermined - no acute changes since last tracing -  patient currently asymptomatic               Chronic knee pain             -- Continue Neurontin 200mg  tid.              -- Tylenol PRN    4. Discharge Planning:              -- Social work and case management to assist with discharge planning and identification of hospital follow-up needs prior to discharge             -- Discharge Concerns: Need to establish a safety plan; Medication compliance and effectiveness             -- Discharge Goals: Return home with outpatient referrals for mental health follow-up including medication management/psychotherapy   Shea Evans, MS-3  Attestation for Student Documentation:   I certify that I saw and interviewed the patient together with the medical student and was present for the duration of the interview.  I reviewed the medical record.  I performed or reperformed the mental status examination of the patient  as indicated.  I formulated the assessment and plan of treatment as documented above. Viann Fish, MD, [facility]

## 2020-12-15 NOTE — BH IP Treatment Plan (Signed)
Interdisciplinary Treatment and Diagnostic Plan Update  12/15/2020 Time of Session: 9:55am Ganesh Deeg MRN: [id number]  Principal Diagnosis: Bipolar affective disorder, current episode depressed ([place])  Secondary Diagnoses: Principal Problem:   Bipolar affective disorder, current episode depressed ([place]) Active Problems:   HLD (hyperlipidemia)   Diabetes type 2, controlled ([place])   HFrEF (heart failure with reduced ejection fraction) ([place])   Tobacco use disorder   Coronary artery disease   S/P coronary artery stent placement (DES to LCx)   Cocaine abuse ([place])   Alcohol abuse   Current Medications:  Current Facility-Administered Medications  Medication Dose Route Frequency Provider Last Rate Last Admin   acetaminophen (TYLENOL) tablet 650 mg  650 mg Oral Q6H PRN Niel Hummer, NP       albuterol (VENTOLIN HFA) 108 (90 Base) MCG/ACT inhaler 1-2 puff  1-2 puff Inhalation Q6H PRN France Ravens, MD   2 puff at 12/13/20 1642   alum & mag hydroxide-simeth (MAALOX/MYLANTA) 200-200-20 MG/5ML suspension 30 mL  30 mL Oral Q4H PRN Niel Hummer, NP       ARIPiprazole (ABILIFY) tablet 5 mg  5 mg Oral Daily Nelda Marseille, Amy E, MD   5 mg at 12/15/20 0802   aspirin EC tablet 81 mg  81 mg Oral Daily Elmarie Shiley A, NP   81 mg at 12/15/20 0802   benzonatate (TESSALON) capsule 100 mg  100 mg Oral TID PRN Harlow Asa, MD       clopidogrel (PLAVIX) tablet 75 mg  75 mg Oral Daily Elmarie Shiley A, NP   75 mg at 12/15/20 0801   dextromethorphan-guaiFENesin (MUCINEX DM) 30-600 MG per 12 hr tablet 1 tablet  1 tablet Oral BID PRN Harlow Asa, MD   1 tablet at 12/10/20 1706   doxycycline (VIBRA-TABS) tablet 100 mg  100 mg Oral Q12H Nelda Marseille, Amy E, MD   100 mg at 12/15/20 0802   DULoxetine (CYMBALTA) DR capsule 20 mg  20 mg Oral Daily Elmarie Shiley A, NP   20 mg at 12/15/20 0803   feeding supplement (GLUCERNA SHAKE) (GLUCERNA SHAKE) liquid 237 mL  237 mL Oral TID BM Massengill, Ovid Curd, MD   237 mL at  12/14/20 2020   gabapentin (NEURONTIN) capsule 200 mg  200 mg Oral TID France Ravens, MD   200 mg at 12/15/20 0801   hydrOXYzine (ATARAX/VISTARIL) tablet 25 mg  25 mg Oral Q6H PRN Harlow Asa, MD       influenza vac split quadrivalent PF (FLUARIX) injection 0.5 mL  0.5 mL Intramuscular Tomorrow-1000 [place], Ene A, NP       insulin aspart (novoLOG) injection 0-9 Units  0-9 Units Subcutaneous TID WC Harlow Asa, MD   2 Units at 12/15/20 0640   lamoTRIgine (LAMICTAL) tablet 25 mg  25 mg Oral Daily Nelda Marseille, Amy E, MD   25 mg at 12/15/20 0802   loperamide (IMODIUM) capsule 2-4 mg  2-4 mg Oral PRN Harlow Asa, MD       LORazepam (ATIVAN) tablet 1 mg  1 mg Oral Q6H PRN Nelda Marseille, Amy E, MD       magnesium hydroxide (MILK OF MAGNESIA) suspension 30 mL  30 mL Oral Daily PRN Niel Hummer, NP       metFORMIN (GLUCOPHAGE-XR) 24 hr tablet 1,000 mg  1,000 mg Oral Q supper Elmarie Shiley A, NP   1,000 mg at 12/14/20 1712   metFORMIN (GLUCOPHAGE-XR) 24 hr tablet 500 mg  500 mg Oral Q  breakfast Elmarie Shiley A, NP   500 mg at 12/15/20 0814   metoprolol succinate (TOPROL-XL) 24 hr tablet 25 mg  25 mg Oral Daily Elmarie Shiley A, NP   25 mg at 12/15/20 4818   multivitamin with minerals tablet 1 tablet  1 tablet Oral Daily Harlow Asa, MD   1 tablet at 12/15/20 5631   nicotine (NICODERM CQ - dosed in mg/24 hours) patch 14 mg  14 mg Transdermal Daily PRN Harlow Asa, MD       nicotine polacrilex (NICORETTE) gum 2 mg  2 mg Oral PRN Massengill, Ovid Curd, MD       ondansetron (ZOFRAN-ODT) disintegrating tablet 4 mg  4 mg Oral Q6H PRN Harlow Asa, MD       pantoprazole (PROTONIX) EC tablet 40 mg  40 mg Oral Daily Elmarie Shiley A, NP   40 mg at 12/15/20 0801   rosuvastatin (CRESTOR) tablet 40 mg  40 mg Oral Daily Massengill, Ovid Curd, MD   40 mg at 12/15/20 0801   thiamine tablet 100 mg  100 mg Oral Daily Harlow Asa, MD   100 mg at 12/15/20 4970   traZODone (DESYREL) tablet 50 mg  50 mg Oral QHS  PRN Niel Hummer, NP   50 mg at 12/14/20 2100   PTA Medications: Medications Prior to Admission  Medication Sig Dispense Refill Last Dose   acetaminophen (TYLENOL) 325 MG tablet Take 2 tablets (650 mg total) by mouth every 4 (four) hours as needed for headache or mild pain.      ARIPiprazole (ABILIFY) 10 MG tablet Take 5 mg by mouth daily.      aspirin EC 81 MG tablet Take 81 mg by mouth daily. Swallow whole.      clopidogrel (PLAVIX) 75 MG tablet Take 1 tablet (75 mg total) by mouth daily. 30 tablet 11    DULoxetine (CYMBALTA) 20 MG capsule Take 20 mg by mouth daily.      empagliflozin (JARDIANCE) 25 MG TABS tablet Take 12.5 mg by mouth daily.      furosemide (LASIX) 20 MG tablet Take 20 mg by mouth daily as needed for fluid.      gabapentin (NEURONTIN) 600 MG tablet Take 600 mg by mouth at bedtime as needed (knee pain).      insulin glargine (LANTUS) 100 UNIT/ML injection Inject 0.4 mLs (40 Units total) into the skin at bedtime. (Patient taking differently: Inject 25 Units into the skin at bedtime.) 10 mL 11    lamoTRIgine (LAMICTAL) 200 MG tablet Take 100 mg by mouth 2 (two) times daily.      lidocaine (LIDODERM) 5 % Place 1 patch onto the skin daily. Remove & Discard patch within 12 hours or as directed by MD apply 1 patch on each knee      metFORMIN (GLUCOPHAGE-XR) 500 MG 24 hr tablet Take 1-2 tablets (500-1,000 mg total) by mouth See admin instructions. Take 1 tablet by mouth every morning, and take 2 tablets every evening      metoprolol succinate (TOPROL-XL) 25 MG 24 hr tablet Take 1 tablet (25 mg total) by mouth daily. 30 tablet 11    Multiple Vitamin (MULTIVITAMIN WITH MINERALS) TABS tablet Take 1 tablet by mouth daily.      nicotine polacrilex (COMMIT) 2 MG lozenge Take 2 mg by mouth every 2 (two) hours as needed for smoking cessation.      omeprazole (PRILOSEC) 20 MG capsule Take 20 mg by mouth daily.  pantoprazole (PROTONIX) 40 MG tablet Take 1 tablet (40 mg total) by mouth  daily. 30 tablet 1    rosuvastatin (CRESTOR) 40 MG tablet Take 40 mg by mouth daily.      sacubitril-valsartan (ENTRESTO) 49-51 MG Take 0.5 tablets by mouth 2 (two) times daily.      traZODone (DESYREL) 50 MG tablet Take 50 mg by mouth at bedtime as needed for sleep.       Patient Stressors: Financial difficulties   Substance abuse    Patient Strengths: Ability for insight  General fund of knowledge  Motivation for treatment/growth   Treatment Modalities: Medication Management, Group therapy, Case management,  1 to 1 session with clinician, Psychoeducation, Recreational therapy.   Physician Treatment Plan for Primary Diagnosis: Bipolar affective disorder, current episode depressed ([place]) Long Term Goal(s): Improvement in symptoms so as ready for discharge   Short Term Goals: Ability to identify triggers associated with substance abuse/mental health issues will improve Ability to verbalize feelings will improve Ability to disclose and discuss suicidal ideas Ability to demonstrate self-control will improve  Medication Management: Evaluate patient's response, side effects, and tolerance of medication regimen.  Therapeutic Interventions: 1 to 1 sessions, Unit Group sessions and Medication administration.  Evaluation of Outcomes: Progressing  Physician Treatment Plan for Secondary Diagnosis: Principal Problem:   Bipolar affective disorder, current episode depressed ([place]) Active Problems:   HLD (hyperlipidemia)   Diabetes type 2, controlled ([place])   HFrEF (heart failure with reduced ejection fraction) ([facility])   Tobacco use disorder   Coronary artery disease   S/P coronary artery stent placement (DES to LCx)   Cocaine abuse ([place])   Alcohol abuse  Long Term Goal(s): Improvement in symptoms so as ready for discharge   Short Term Goals: Ability to identify triggers associated with substance abuse/mental health issues will improve Ability to verbalize feelings will improve Ability to  disclose and discuss suicidal ideas Ability to demonstrate self-control will improve     Medication Management: Evaluate patient's response, side effects, and tolerance of medication regimen.  Therapeutic Interventions: 1 to 1 sessions, Unit Group sessions and Medication administration.  Evaluation of Outcomes: Progressing   RN Treatment Plan for Primary Diagnosis: Bipolar affective disorder, current episode depressed ([place]) Long Term Goal(s): Knowledge of disease and therapeutic regimen to maintain health will improve  Short Term Goals: Ability to remain free from injury will improve, Ability to verbalize frustration and anger appropriately will improve, Ability to demonstrate self-control, Ability to identify and develop effective coping behaviors will improve, and Compliance with prescribed medications will improve  Medication Management: RN will administer medications as ordered by provider, will assess and evaluate patient's response and provide education to patient for prescribed medication. RN will report any adverse and/or side effects to prescribing provider.  Therapeutic Interventions: 1 on 1 counseling sessions, Psychoeducation, Medication administration, Evaluate responses to treatment, Monitor vital signs and CBGs as ordered, Perform/monitor CIWA, COWS, AIMS and Fall Risk screenings as ordered, Perform wound care treatments as ordered.  Evaluation of Outcomes: Progressing   LCSW Treatment Plan for Primary Diagnosis: Bipolar affective disorder, current episode depressed ([place]) Long Term Goal(s): Safe transition to appropriate next level of care at discharge, Engage patient in therapeutic group addressing interpersonal concerns.  Short Term Goals: Engage patient in aftercare planning with referrals and resources, Increase social support, Identify triggers associated with mental health/substance abuse issues, and Increase skills for wellness and recovery  Therapeutic Interventions:  Assess for all discharge needs, 1 to 1 time with Social  worker, Explore available resources and support systems, Assess for adequacy in community support network, Educate family and significant other(s) on suicide prevention, Complete Psychosocial Assessment, Interpersonal group therapy.  Evaluation of Outcomes: Progressing   Progress in Treatment: Attending groups: Yes. Participating in groups: Yes. Taking medication as prescribed: Yes. Toleration medication: Yes. Family/Significant other contact made: No, will contact:  [place]  Patient understands diagnosis: Yes. Discussing patient identified problems/goals with staff: Yes. Medical problems stabilized or resolved: Yes. Denies suicidal/homicidal ideation: Yes. Issues/concerns per patient self-inventory: No.     New problem(s) identified: No, Describe:  None    New Short Term/Long Term Goal(s): medication stabilization, elimination of SI thoughts, development of comprehensive mental wellness plan.    Patient Goals: "To think more positively and get into residential treatment"    Discharge Plan or Barriers: Patient is to discharge with residential referral for future substance use treatment    Reason for Continuation of Hospitalization: Depression Medication stabilization  Estimated Length of Stay: 3 to 5 days    Scribe for Treatment Team: Vassie Moselle, LCSW 12/15/2020 10:59 AM

## 2020-12-15 NOTE — Progress Notes (Signed)
Pt visible on the unit, pt happy he's D/C tomorrow, pt given PRN Trazodone with HS medication    12/15/20 2000  Psych Admission Type (Psych Patients Only)  Admission Status Voluntary  Psychosocial Assessment  Patient Complaints None  Eye Contact Fair  Facial Expression Animated  Affect Appropriate to circumstance  Speech Logical/coherent;Soft  Interaction Assertive  Motor Activity Slow  Appearance/Hygiene Poor hygiene;Body odor  Behavior Characteristics Cooperative  Mood Pleasant  Aggressive Behavior  Effect No apparent injury  Thought Process  Coherency WDL  Content Blaming others  Delusions WDL  Perception WDL  Hallucination None reported or observed  Judgment Limited  Confusion None  Danger to Self  Current suicidal ideation? Denies  Self-Injurious Behavior No self-injurious ideation or behavior indicators observed or expressed   Agreement Not to Harm Self Yes (Verbally)  Description of Agreement Verbal  Danger to Others  Danger to Others None reported or observed

## 2020-12-15 NOTE — Progress Notes (Signed)
CSW Progress Notes:   Events affecting the discharge plan: [place] coordination to get into residential treatment 2.  Interventions by CSW: Scheduled follow up PCP appointment Scheduled follow up med management and therapy appointments Provided contact information for [facility] to follow up  Referrals sent to [facility]  Shelter resources provided            3. Emotional response of the patient/family to the plan of care: - Patient has denied consents to speak with social supports     [employer], [place]

## 2020-12-15 NOTE — Progress Notes (Signed)
Pt denies SI, HI, AVH and pain when assessed. Visible in dayroom for groups and in hall interacting well with peers on and off unit. Affect is bright, he's animated and expressed his desire for discharge. Per pt "I feel alright, I'm ready to go now. I can handle stuff on the outside now with since I have my medication". Tolerates meals and medications well without discomfort.  Support and reassurance offered to pt. Q 15 minutes safety checks remains effective without incident. All medications given with verbal education and effects monitored.  Pt is denies concerns at this time.

## 2020-12-15 NOTE — BHH Suicide Risk Assessment (Signed)
[place] INPATIENT:  Family/Significant Other Suicide Prevention Education  Suicide Prevention Education:  Patient Refusal for Family/Significant Other Suicide Prevention Education: The patient [name] has refused to provide written consent for family/significant other to be provided Family/Significant Other Suicide Prevention Education during admission and/or prior to discharge.  Physician notified.  Tianna Baus E Wardell Pokorski 12/15/2020, 1:58 PM

## 2020-12-16 DIAGNOSIS — K219 Gastro-esophageal reflux disease without esophagitis: Secondary | ICD-10-CM | POA: Insufficient documentation

## 2020-12-16 DIAGNOSIS — Z765 Malingerer [conscious simulation]: Secondary | ICD-10-CM | POA: Insufficient documentation

## 2020-12-16 DIAGNOSIS — J309 Allergic rhinitis, unspecified: Secondary | ICD-10-CM | POA: Insufficient documentation

## 2020-12-16 DIAGNOSIS — F1920 Other psychoactive substance dependence, uncomplicated: Secondary | ICD-10-CM | POA: Insufficient documentation

## 2020-12-16 DIAGNOSIS — R918 Other nonspecific abnormal finding of lung field: Secondary | ICD-10-CM

## 2020-12-16 DIAGNOSIS — IMO0001 Reserved for inherently not codable concepts without codable children: Secondary | ICD-10-CM

## 2020-12-16 DIAGNOSIS — M129 Arthropathy, unspecified: Secondary | ICD-10-CM | POA: Insufficient documentation

## 2020-12-16 DIAGNOSIS — F1994 Other psychoactive substance use, unspecified with psychoactive substance-induced mood disorder: Secondary | ICD-10-CM | POA: Insufficient documentation

## 2020-12-16 DIAGNOSIS — L603 Nail dystrophy: Secondary | ICD-10-CM | POA: Insufficient documentation

## 2020-12-16 DIAGNOSIS — B353 Tinea pedis: Secondary | ICD-10-CM | POA: Insufficient documentation

## 2020-12-16 DIAGNOSIS — G4700 Insomnia, unspecified: Secondary | ICD-10-CM | POA: Insufficient documentation

## 2020-12-16 DIAGNOSIS — N289 Disorder of kidney and ureter, unspecified: Secondary | ICD-10-CM | POA: Insufficient documentation

## 2020-12-16 DIAGNOSIS — R972 Elevated prostate specific antigen [PSA]: Secondary | ICD-10-CM | POA: Insufficient documentation

## 2020-12-16 DIAGNOSIS — Z5900 Homelessness unspecified: Secondary | ICD-10-CM | POA: Insufficient documentation

## 2020-12-16 DIAGNOSIS — R197 Diarrhea, unspecified: Secondary | ICD-10-CM | POA: Insufficient documentation

## 2020-12-16 DIAGNOSIS — H04129 Dry eye syndrome of unspecified lacrimal gland: Secondary | ICD-10-CM | POA: Insufficient documentation

## 2020-12-16 DIAGNOSIS — R42 Dizziness and giddiness: Secondary | ICD-10-CM | POA: Insufficient documentation

## 2020-12-16 HISTORY — DX: Disorder of kidney and ureter, unspecified: N28.9

## 2020-12-16 HISTORY — DX: Elevated prostate specific antigen (PSA): R97.20

## 2020-12-16 HISTORY — DX: Other nonspecific abnormal finding of lung field: R91.8

## 2020-12-16 HISTORY — DX: Reserved for inherently not codable concepts without codable children: IMO0001

## 2020-12-16 HISTORY — DX: Malingerer (conscious simulation): Z76.5

## 2020-12-16 LAB — GLUCOSE, CAPILLARY
Glucose-Capillary: 172 mg/dL — ABNORMAL HIGH (ref 70–99)
Glucose-Capillary: 182 mg/dL — ABNORMAL HIGH (ref 70–99)

## 2020-12-16 MED ORDER — METFORMIN HCL ER (OSM) 1000 MG PO TB24: 1000.0000 mg | ORAL_TABLET | Freq: Every day | ORAL | 0 refills | Status: DC

## 2020-12-16 MED ORDER — GABAPENTIN 100 MG PO CAPS: 200.0000 mg | ORAL_CAPSULE | Freq: Three times a day (TID) | ORAL | 0 refills | Status: DC

## 2020-12-16 MED ORDER — METFORMIN HCL ER 500 MG PO TB24: 500.0000 mg | ORAL_TABLET | Freq: Every day | ORAL | 0 refills | Status: DC

## 2020-12-16 MED ORDER — DULOXETINE HCL 20 MG PO CPEP: 20.0000 mg | ORAL_CAPSULE | Freq: Every day | ORAL | 0 refills | Status: DC

## 2020-12-16 MED ORDER — ALBUTEROL SULFATE HFA 108 (90 BASE) MCG/ACT IN AERS: 1.0000 | INHALATION_SPRAY | Freq: Four times a day (QID) | RESPIRATORY_TRACT | 0 refills | Status: AC | PRN

## 2020-12-16 MED ORDER — CLOPIDOGREL BISULFATE 75 MG PO TABS: 75.0000 mg | ORAL_TABLET | Freq: Every day | ORAL | 0 refills | Status: DC

## 2020-12-16 MED ORDER — LAMOTRIGINE 25 MG PO TABS: 25.0000 mg | ORAL_TABLET | Freq: Every day | ORAL | 0 refills | Status: DC

## 2020-12-16 MED ORDER — METOPROLOL SUCCINATE ER 25 MG PO TB24: 25.0000 mg | ORAL_TABLET | Freq: Every day | ORAL | 0 refills | Status: DC

## 2020-12-16 MED ORDER — NICOTINE POLACRILEX 2 MG MT GUM: 2.0000 mg | CHEWING_GUM | OROMUCOSAL | 0 refills | Status: DC | PRN

## 2020-12-16 NOTE — BHH Group Notes (Signed)
Spiritual care group on grief and loss facilitated by chaplain Janne Napoleon, [facility]   Group Goal:   Support / Education around grief and loss   Members engage in facilitated group support and psycho-social education.   Group Description:   Following introductions and group rules, group members engaged in facilitated group dialog and support around topic of loss, with particular support around experiences of loss in their lives. Group Identified types of loss (relationships / self / things) and identified patterns, circumstances, and changes that precipitate losses. Reflected on thoughts / feelings around loss, normalized grief responses, and recognized variety in grief experience. Group noted Worden's four tasks of grief in discussion.   Group drew on Adlerian / Rogerian, narrative, MI,   Patient Progress: Prior to the beginning of group, [name] initially asked to meet after group one-on-one.  He participated very actively and engaged in the conversation.  He shared about not knowing how to grieve and shared about his grief over years of his life that he lost to addiction.  After group, he said that he had gotten what he needed and did not need to meet individually anymore.  [address], [place] Pager, [phone]

## 2020-12-16 NOTE — Progress Notes (Signed)
  [facility] Adult Case Management Discharge Plan :  Will you be returning to the same living situation after discharge:  Yes,  shelter At discharge, do you have transportation home?: No. Safe transport and part bus tickets to get to appointments Do you have the ability to pay for your medications: Yes,  insurance  Release of information consent forms completed and in the chart;  Patient's signature needed at discharge.  Patient to Follow up at:  Follow-up Information     [facility] Follow up on 12/25/2020.   Why: You have a hospital follow up appointment for therapy and medication management services on 12/25/20 at 1:00 pm.  This appointment will be Virtual telehealth; the provider will call you at that time. Contact information: [place] [address] [phone]         [facility]   Why: You have an appointment with your primary care on 11/9 at 11:30am with Dr. Waldon Merl. A referral for community services at [facility] has been sent to PCP, please inquire at this appointment for approval to residential mental health/substance use treatment. Contact information: [place] [address] [phone]         Josue Hector, MD .   Specialty: Cardiology Contact information: [phone] [address] [address] [phone]         [facility]. Call.   Why: A referral has been placed on your behalf.  They are waiting on approval from the [place].  Please follow up for admit to their residential program. Contact information: [place] [address] [phone]                Next level of care provider has access to [place] and Suicide Prevention discussed: Yes,  with patient     Has patient been referred to the Quitline?: Patient refused referral, patient states that he would like to only work  with the gum, when CSW informed patient that the quitline can help provide gum, patient reports that his insurance and the [place] already provide gum.   Patient has been referred for addiction treatment: Yes, residential treatment to follow up with   [place], LCSW 12/16/2020, 9:36 AM

## 2020-12-16 NOTE — Progress Notes (Signed)
   12/16/20 0900  Psych Admission Type (Psych Patients Only)  Admission Status Voluntary  Psychosocial Assessment  Patient Complaints None  Eye Contact Fair  Facial Expression Animated  Affect Appropriate to circumstance  Speech Logical/coherent;Soft  Interaction Assertive  Motor Activity Slow  Appearance/Hygiene Unremarkable  Behavior Characteristics Cooperative;Appropriate to situation  Mood Pleasant  Aggressive Behavior  Effect No apparent injury  Thought Process  Coherency WDL  Content WDL  Delusions WDL  Perception WDL  Hallucination None reported or observed  Judgment Limited  Confusion None  Danger to Self  Current suicidal ideation? Denies  Self-Injurious Behavior No self-injurious ideation or behavior indicators observed or expressed   Agreement Not to Harm Self Yes (Verbally)  Description of Agreement Verbal  Danger to Others  Danger to Others None reported or observed

## 2020-12-16 NOTE — BHH Suicide Risk Assessment (Signed)
[facility] Discharge Suicide Risk Assessment   Principal Problem: Bipolar affective disorder, current episode depressed ([place]) Discharge Diagnoses: Principal Problem:   Bipolar affective disorder, current episode depressed ([place]) Active Problems:   HLD (hyperlipidemia)   Diabetes type 2, controlled ([place])   HFrEF (heart failure with reduced ejection fraction) (HCC)   Tobacco use disorder   Coronary artery disease   S/P coronary artery stent placement (DES to LCx)   Cocaine abuse ([place])   Alcohol abuse   Total Time spent with patient: 15 minutes  Musculoskeletal: Strength & Muscle Tone: within normal limits Gait & Station: normal Patient leans: N/A  Psychiatric Specialty Exam  Presentation  General Appearance: Appropriate for Environment; Casual  Eye Contact:Fair  Speech:Clear and Coherent; Normal Rate  Speech Volume:Normal  Handedness:Right   Mood and Affect  Mood:Depressed; Dysphoric; Hopeless; Worthless  Duration of Depression Symptoms: Less than two weeks  Affect:Congruent   Thought Process  Thought Processes:Coherent; Linear  Descriptions of Associations:Intact  Orientation:Full (Time, Place and Person)  Thought Content:Logical  History of Schizophrenia/Schizoaffective disorder:No  Duration of Psychotic Symptoms:No data recorded Hallucinations:No data recorded Ideas of Reference:None  Suicidal Thoughts:No data recorded Homicidal Thoughts:No data recorded  Sensorium  Memory:Immediate Good; Recent Good; Remote Fair  Judgment:Poor  Insight:Poor   Executive Functions  Concentration:Fair  Attention Span:Fair  [place]   Psychomotor Activity  Psychomotor Activity:No data recorded  Assets  Assets:Desire for Improvement; Communication Skills; Leisure Time; Resilience; Vocational/Educational   Sleep  Sleep:No data recorded  Physical Exam: Physical Exam Vitals and nursing note reviewed.   Constitutional:      Appearance: Normal appearance.  HENT:     Head: Normocephalic and atraumatic.     Nose: Nose normal.  Eyes:     Extraocular Movements: Extraocular movements intact.  Pulmonary:     Effort: Pulmonary effort is normal.  Musculoskeletal:     Cervical back: Normal range of motion.  Neurological:     General: No focal deficit present.     Mental Status: He is alert and oriented to person, place, and time.  Psychiatric:        Attention and Perception: Attention and perception normal.        Mood and Affect: Mood normal.        Speech: Speech normal.        Behavior: Behavior is cooperative.        Cognition and Memory: Cognition normal.        Judgment: Judgment normal.   ROS Blood pressure 129/81, pulse (!) 106, temperature 97.6 F (36.4 C), temperature source Oral, resp. rate 18, height 5\' 10"  (1.778 m), weight 73.5 kg, SpO2 98 %. Body mass index is 23.24 kg/m.  Mental Status Per Nursing Assessment::   On Admission:  Suicidal ideation indicated by patient  Demographic Factors:  Male, Low socioeconomic status, and Unemployed  Loss Factors: Decrease in vocational status and Financial problems/change in socioeconomic status  Historical Factors: Prior suicide attempts and Family history of mental illness or substance abuse  Risk Reduction Factors:   Positive coping skills or problem solving skills  Continued Clinical Symptoms:  Bipolar Disorder:   Mixed State Alcohol/Substance Abuse/Dependencies  Cognitive Features That Contribute To Risk:  Polarized thinking    Suicide Risk:  Minimal: No identifiable suicidal ideation.  Patients presenting with no risk factors but with morbid ruminations; may be classified as minimal risk based on the severity of the depressive symptoms   Follow-up Information  [facility] Follow up on 12/25/2020.   Why: You have a hospital follow up appointment for therapy and medication management services on  12/25/20 at 1:00 pm.  This appointment will be Virtual telehealth; the provider will call you at that time. Contact information: [place] [address] [phone]         [facility]   Why: You have an appointment with your primary care on 11/9 at 11:30am with Dr. Waldon Merl. A referral for community services at [facility] has been sent to PCP, please inquire at this appointment for approval to residential mental health/substance use treatment. Contact information: [place] [address] [phone]         Josue Hector, MD .   Specialty: Cardiology Contact information: [phone] [address] [phone]         [facility]. Call.   Why: A referral has been placed on your behalf.  They are waiting on approval from the [place].  Please follow up for admit to their residential program. Contact information: [place] [address] [phone]                Plan Of Care/Follow-up recommendations:  Activity:  ad lib Diet:  cardiac diet Prescriptions for new medications provided for the patient to bridge to follow up appointment. The patient was informed that refills for these prescriptions are generally not provided, and patient is encouraged to attend all follow up appointments to address medication refills and adjustments.   Today's discharge was reviewed with treatment team, and the team is in agreement that the patient is ready for discharge. The patient is was of the discharge plan for today and has been given opportunity to ask questions. At time of discharge, the patient does not vocalize any acute harm to self or others, is goal directed, able to advocate for self and organizational baseline.   At discharge, the patient is instructed to:  Take all medications as prescribed. Report any adverse effects and or  reactions from the medicines to her outpatient provider promptly.  Do not engage in alcohol and/or illegal drug use while on prescription medicines.  In the event of worsening symptoms, patient is instructed to call the crisis hotline, 911 and or go to the nearest ED for appropriate evaluation and treatment of symptoms.  Follow-up with primary care provider for further care of medical issues, concerns and or health care needs.   Maida Sale, MD 12/16/2020, 9:47 AM

## 2020-12-16 NOTE — Progress Notes (Signed)
Pt discharged to lobby.[facility] present to pick up patient. Pt was stable and appreciative at that time. All papers and prescriptions were given and valuables returned. Verbal understanding expressed. Denies SI/HI and A/VH. Pt given opportunity to express concerns and ask questions.

## 2020-12-16 NOTE — Progress Notes (Signed)
Pt did not attend group because he said he's being discharged this morning.

## 2020-12-24 ENCOUNTER — Telehealth: Payer: Self-pay

## 2020-12-24 NOTE — Telephone Encounter (Signed)
..   Medicaid Managed Care   Unsuccessful Outreach Note  12/24/2020 Name: [name] MRN: [id number] DOB: 1955-06-26  Referred by: [facility] Reason for referral : High Risk Managed Medicaid (I called the patient today to offer him the services of the [facility]. When I introduced myself they hung up. )   Third unsuccessful telephone outreach was attempted today. The patient was referred to the case management team for assistance with care management and care coordination. The patient's primary care provider has been notified of our unsuccessful attempts to make or maintain contact with the patient. The care management team is pleased to engage with this patient at any time in the future should he/she be interested in assistance from the care management team.   Follow Up Plan: We have been unable to make contact with the patient for follow up. The care management team is available to follow up with the patient after provider conversation with the patient regarding recommendation for care management engagement and subsequent re-referral to the care management team.   [place], [place]

## 2021-01-04 ENCOUNTER — Other Ambulatory Visit: Payer: Self-pay

## 2021-01-04 ENCOUNTER — Emergency Department (HOSPITAL_COMMUNITY)
Admission: EM | Admit: 2021-01-04 | Discharge: 2021-01-05 | Disposition: A | Discharge status: Other Acute Inpatient Hospital | Payer: No Typology Code available for payment source | Attending: Emergency Medicine | Admitting: Emergency Medicine

## 2021-01-04 ENCOUNTER — Encounter (HOSPITAL_COMMUNITY): Payer: Self-pay | Admitting: Emergency Medicine

## 2021-01-04 DIAGNOSIS — I10 Essential (primary) hypertension: Secondary | ICD-10-CM | POA: Diagnosis not present

## 2021-01-04 DIAGNOSIS — F10232 Alcohol dependence with withdrawal with perceptual disturbance: Secondary | ICD-10-CM | POA: Diagnosis present

## 2021-01-04 DIAGNOSIS — Y900 Blood alcohol level of less than 20 mg/100 ml: Secondary | ICD-10-CM | POA: Insufficient documentation

## 2021-01-04 DIAGNOSIS — R45851 Suicidal ideations: Secondary | ICD-10-CM | POA: Insufficient documentation

## 2021-01-04 DIAGNOSIS — F1010 Alcohol abuse, uncomplicated: Secondary | ICD-10-CM | POA: Diagnosis present

## 2021-01-04 DIAGNOSIS — Z7982 Long term (current) use of aspirin: Secondary | ICD-10-CM | POA: Diagnosis not present

## 2021-01-04 DIAGNOSIS — E119 Type 2 diabetes mellitus without complications: Secondary | ICD-10-CM | POA: Insufficient documentation

## 2021-01-04 DIAGNOSIS — I2510 Atherosclerotic heart disease of native coronary artery without angina pectoris: Secondary | ICD-10-CM | POA: Diagnosis not present

## 2021-01-04 DIAGNOSIS — Z20822 Contact with and (suspected) exposure to covid-19: Secondary | ICD-10-CM | POA: Insufficient documentation

## 2021-01-04 DIAGNOSIS — Z046 Encounter for general psychiatric examination, requested by authority: Secondary | ICD-10-CM | POA: Diagnosis not present

## 2021-01-04 DIAGNOSIS — F333 Major depressive disorder, recurrent, severe with psychotic symptoms: Secondary | ICD-10-CM

## 2021-01-04 DIAGNOSIS — F32A Depression, unspecified: Secondary | ICD-10-CM | POA: Diagnosis present

## 2021-01-04 DIAGNOSIS — F332 Major depressive disorder, recurrent severe without psychotic features: Secondary | ICD-10-CM | POA: Diagnosis not present

## 2021-01-04 DIAGNOSIS — Z85828 Personal history of other malignant neoplasm of skin: Secondary | ICD-10-CM | POA: Diagnosis not present

## 2021-01-04 DIAGNOSIS — F17210 Nicotine dependence, cigarettes, uncomplicated: Secondary | ICD-10-CM | POA: Insufficient documentation

## 2021-01-04 DIAGNOSIS — F319 Bipolar disorder, unspecified: Secondary | ICD-10-CM | POA: Diagnosis present

## 2021-01-04 DIAGNOSIS — F1410 Cocaine abuse, uncomplicated: Secondary | ICD-10-CM | POA: Diagnosis present

## 2021-01-04 LAB — COMPREHENSIVE METABOLIC PANEL
ALT: 23 U/L (ref 0–44)
AST: 25 U/L (ref 15–41)
Albumin: 3.7 g/dL (ref 3.5–5.0)
Alkaline Phosphatase: 64 U/L (ref 38–126)
Anion gap: 5 (ref 5–15)
BUN: 13 mg/dL (ref 8–23)
CO2: 25 mmol/L (ref 22–32)
Calcium: 8.9 mg/dL (ref 8.9–10.3)
Chloride: 104 mmol/L (ref 98–111)
Creatinine, Ser: 0.95 mg/dL (ref 0.61–1.24)
GFR, Estimated: >60 mL/min (ref >60)
Glucose, Bld: 145 mg/dL — ABNORMAL HIGH (ref 70–99)
Potassium: 3.7 mmol/L (ref 3.5–5.1)
Sodium: 134 mmol/L — ABNORMAL LOW (ref 135–145)
Total Bilirubin: 0.5 mg/dL (ref 0.3–1.2)
Total Protein: 6.7 g/dL (ref 6.5–8.1)

## 2021-01-04 LAB — CBG MONITORING, ED: Glucose-Capillary: 150 mg/dL — ABNORMAL HIGH (ref 70–99)

## 2021-01-04 LAB — CBC
HCT: 42.3 % (ref 39.0–52.0)
Hemoglobin: 14 g/dL (ref 13.0–17.0)
MCH: 28.6 pg (ref 26.0–34.0)
MCHC: 33.1 g/dL (ref 30.0–36.0)
MCV: 86.3 fL (ref 80.0–100.0)
Platelets: 204 10*3/uL (ref 150–400)
RBC: 4.9 MIL/uL (ref 4.22–5.81)
RDW: 14.4 % (ref 11.5–15.5)
WBC: 4.6 10*3/uL (ref 4.0–10.5)
nRBC: 0 % (ref 0.0–0.2)

## 2021-01-04 LAB — RESP PANEL BY RT-PCR (FLU A&B, COVID) ARPGX2
Influenza A by PCR: NEGATIVE
Influenza B by PCR: NEGATIVE
SARS Coronavirus 2 by RT PCR: NEGATIVE

## 2021-01-04 LAB — ACETAMINOPHEN LEVEL: Acetaminophen (Tylenol), Serum: <10 ug/mL — ABNORMAL LOW (ref 10–30)

## 2021-01-04 LAB — ETHANOL: Alcohol, Ethyl (B): <10 mg/dL (ref <10)

## 2021-01-04 LAB — RAPID URINE DRUG SCREEN, HOSP PERFORMED
Amphetamines: NOT DETECTED
Barbiturates: NOT DETECTED
Benzodiazepines: NOT DETECTED
Cocaine: POSITIVE — AB
Opiates: NOT DETECTED
Tetrahydrocannabinol: POSITIVE — AB

## 2021-01-04 LAB — SALICYLATE LEVEL: Salicylate Lvl: <7 mg/dL — ABNORMAL LOW (ref 7.0–30.0)

## 2021-01-04 MED ORDER — LAMOTRIGINE 25 MG PO TABS
25.0000 mg | ORAL_TABLET | Freq: Every day | ORAL | Status: DC
  Administered 2021-01-04 – 2021-01-05 (×2): 25 mg via ORAL
  Filled 2021-01-04 (×2): qty 1

## 2021-01-04 MED ORDER — TRAZODONE HCL 50 MG PO TABS: 50.0000 mg | ORAL_TABLET | Freq: Every evening | ORAL | Status: DC | PRN

## 2021-01-04 MED ORDER — ASPIRIN EC 81 MG PO TBEC
81.0000 mg | DELAYED_RELEASE_TABLET | Freq: Every day | ORAL | Status: DC
  Administered 2021-01-04 – 2021-01-05 (×2): 81 mg via ORAL
  Filled 2021-01-04 (×2): qty 1

## 2021-01-04 MED ORDER — PANTOPRAZOLE SODIUM 40 MG PO TBEC
40.0000 mg | DELAYED_RELEASE_TABLET | Freq: Every day | ORAL | Status: DC
  Administered 2021-01-04 – 2021-01-05 (×2): 40 mg via ORAL
  Filled 2021-01-04 (×2): qty 1

## 2021-01-04 MED ORDER — ROSUVASTATIN CALCIUM 20 MG PO TABS
40.0000 mg | ORAL_TABLET | Freq: Every day | ORAL | Status: DC
  Administered 2021-01-04 – 2021-01-05 (×2): 40 mg via ORAL
  Filled 2021-01-04 (×2): qty 2

## 2021-01-04 MED ORDER — METFORMIN HCL ER 500 MG PO TB24
1000.0000 mg | ORAL_TABLET | Freq: Every day | ORAL | Status: DC
  Administered 2021-01-04 – 2021-01-05 (×2): 1000 mg via ORAL
  Filled 2021-01-04 (×3): qty 2

## 2021-01-04 MED ORDER — DULOXETINE HCL 20 MG PO CPEP
20.0000 mg | ORAL_CAPSULE | Freq: Every day | ORAL | Status: DC
  Administered 2021-01-05: 20 mg via ORAL
  Filled 2021-01-04: qty 1

## 2021-01-04 MED ORDER — METOPROLOL SUCCINATE ER 25 MG PO TB24
25.0000 mg | ORAL_TABLET | Freq: Every day | ORAL | Status: DC
  Administered 2021-01-04 – 2021-01-05 (×2): 25 mg via ORAL
  Filled 2021-01-04 (×2): qty 1

## 2021-01-04 MED ORDER — ALBUTEROL SULFATE HFA 108 (90 BASE) MCG/ACT IN AERS: 1.0000 | INHALATION_SPRAY | Freq: Four times a day (QID) | RESPIRATORY_TRACT | Status: DC | PRN

## 2021-01-04 MED ORDER — DOCUSATE SODIUM 100 MG PO CAPS
100.0000 mg | ORAL_CAPSULE | Freq: Once | ORAL | Status: AC
  Administered 2021-01-04: 100 mg via ORAL
  Filled 2021-01-04: qty 1

## 2021-01-04 MED ORDER — ARIPIPRAZOLE 10 MG PO TABS
5.0000 mg | ORAL_TABLET | Freq: Every day | ORAL | Status: DC
  Administered 2021-01-05: 5 mg via ORAL
  Filled 2021-01-04: qty 1

## 2021-01-04 MED ORDER — GABAPENTIN 100 MG PO CAPS
200.0000 mg | ORAL_CAPSULE | Freq: Three times a day (TID) | ORAL | Status: DC
  Administered 2021-01-04 – 2021-01-05 (×4): 200 mg via ORAL
  Filled 2021-01-04 (×4): qty 2

## 2021-01-04 MED ORDER — CLOPIDOGREL BISULFATE 75 MG PO TABS
75.0000 mg | ORAL_TABLET | Freq: Every day | ORAL | Status: DC
  Administered 2021-01-04 – 2021-01-05 (×2): 75 mg via ORAL
  Filled 2021-01-04 (×2): qty 1

## 2021-01-04 NOTE — ED Triage Notes (Signed)
Pt states he is suicidal with a plan to "shoot myself in the head".  States he was seen at [facility] for same a few weeks ago.  Also reports constipation since yesterday.

## 2021-01-04 NOTE — ED Provider Notes (Signed)
[facility] EMERGENCY DEPARTMENT Provider Note   CSN: [id number] Arrival date & time: 01/04/21  0700     History Chief Complaint  Patient presents with   Suicidal    Nelson Shellhammer is a 65 y.o. male.  HPI  65 year old male presents today complaining of suicidal stating that he would like to shoot himself in the head Review of psychiatry discharge on 11 1 reveals patient was admitted with major depressive disorder.  He was discharged with "good mood".  He was to follow-up at [facility].  He was discharged on aripiprazole 5 mg daily, duloxetine 20 mg daily, gabapentin, lamotrigine and trazodone. Past Medical History:  Diagnosis Date   Anxiety    Arthritis    Cancer ([place])    Cataract    Depression    Diabetes mellitus    GERD (gastroesophageal reflux disease)    H/O suicide attempt 1997 and 2011   jumped out of moving vehicle and attempted to jump into moving traffic   High cholesterol    Hypertension    Neuropathic pain    Osteoporosis     Patient Active Problem List   Diagnosis Date Noted   Allergic rhinitis 12/16/2020   Tinea pedis 12/16/2020   Tear film insufficiency 12/16/2020   Person feigning illness 12/16/2020   Arthropathy 12/16/2020   Multiple nodules of lung 12/16/2020   Dystrophia unguium 12/16/2020   Legal circumstance 12/16/2020   Insomnia 12/16/2020   Homelessness 12/16/2020   Esophageal reflux 12/16/2020   Drug-induced mood disorder ([place]) 12/16/2020   Elevated prostate specific antigen (PSA) 12/16/2020   Dizziness and giddiness 12/16/2020   Disorder of kidney and ureter 12/16/2020   Diarrhea 12/16/2020   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, abuse ([place]) 12/16/2020   Alcohol abuse 12/09/2020   Homicidal ideation 12/06/2020   Cocaine abuse ([place]) 12/06/2020   S/P coronary artery stent placement (DES to LCx) 10/11/2020   Pleural effusion due to congestive heart failure ([place]) 01/11/2020   Aortic  atherosclerosis ([place]) 01/11/2020   Coronary artery disease 01/11/2020   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   Orthostatic hypotension 01/10/2020   Heart failure, unspecified ([place]) 01/06/2020   Tobacco use disorder 01/06/2020   Obesity 01/06/2020   Bilateral lower extremity edema 01/04/2020   Shortness of breath 01/04/2020   MDD (major depressive disorder), recurrent severe, without psychosis ([place]) 10/09/2016   Diabetes type 2, controlled ([place]) 01/01/2015   Erectile dysfunction 01/01/2015   Healthcare maintenance 01/01/2015   Bipolar 1 disorder, depressed ([place]) 07/23/2013   Bipolar disorder ([place]) 04/03/2013   MDD (major depressive disorder), recurrent, severe, with psychosis ([place]) 11/04/2012   Suicidal ideation 11/04/2012   Post traumatic stress disorder (PTSD) 11/04/2012   CARCINOMA, RENAL CELL 06/05/2008   HLD (hyperlipidemia) 06/05/2008   Bipolar affective disorder, current episode depressed ([place]) 06/05/2008   Essential hypertension 06/05/2008   CHEST PAIN 06/05/2008    Past Surgical History:  Procedure Laterality Date   COLONOSCOPY     CORONARY STENT INTERVENTION N/A 10/10/2020   Procedure: CORONARY STENT INTERVENTION;  Surgeon: Jettie Booze, MD;  Location: [place] [facility];  Service: Cardiovascular;  Laterality: N/A;   LEFT HEART CATH AND CORONARY ANGIOGRAPHY N/A 10/10/2020   Procedure: LEFT HEART CATH AND CORONARY ANGIOGRAPHY;  Surgeon: Jettie Booze, MD;  Location: [place] [facility] [facility];  Service: Cardiovascular;  Laterality: N/A;   NEPHRECTOMY     due to cancer   RIGHT/LEFT HEART CATH AND CORONARY ANGIOGRAPHY N/A 10/09/2020  Procedure: RIGHT/LEFT HEART CATH AND CORONARY ANGIOGRAPHY;  Surgeon: Nelva Bush, MD;  Location: [facility];  Service: Cardiovascular;  Laterality: N/A;       Family History  Problem Relation Age of Onset   Alcohol abuse Sister    Alcohol abuse Brother    Colon cancer Neg Hx    Esophageal cancer Neg Hx    Pancreatic cancer Neg Hx     Rectal cancer Neg Hx    Stomach cancer Neg Hx     Social History   Tobacco Use   Smoking status: Every Day    Packs/day: 0.50    Years: 20.00    Pack years: 10.00    Types: Cigarettes   Smokeless tobacco: Never  Vaping Use   Vaping Use: Never used  Substance Use Topics   Alcohol use: Yes    Alcohol/week: 3.0 standard drinks    Types: 3 Cans of beer per week    Comment: Pt has been clean for 8 months   Drug use: Yes    Types: "Crack" cocaine, Cocaine    Home Medications Prior to Admission medications   Medication Sig Start Date End Date Taking? Authorizing Provider  albuterol (VENTOLIN HFA) 108 (90 Base) MCG/ACT inhaler Inhale 1-2 puffs into the lungs every 6 (six) hours as needed for wheezing or shortness of breath. 12/16/20 01/15/21  Maida Sale, MD  ARIPiprazole (ABILIFY) 10 MG tablet Take 5 mg by mouth daily.    [provider]  aspirin EC 81 MG tablet Take 81 mg by mouth daily. Swallow whole.    [provider]  clopidogrel (PLAVIX) 75 MG tablet Take 1 tablet (75 mg total) by mouth daily. 12/16/20 01/15/21  Maida Sale, MD  DULoxetine (CYMBALTA) 20 MG capsule Take 1 capsule (20 mg total) by mouth daily. 12/16/20 01/15/21  Maida Sale, MD  gabapentin (NEURONTIN) 100 MG capsule Take 2 capsules (200 mg total) by mouth 3 (three) times daily. 12/16/20 01/15/21  Maida Sale, MD  lamoTRIgine (LAMICTAL) 25 MG tablet Take 1 tablet (25 mg total) by mouth daily. 12/16/20 01/15/21  Maida Sale, MD  metFORMIN (FORTAMET) 1000 MG (OSM) 24 hr tablet Take 1 tablet (1,000 mg total) by mouth daily with supper. 12/16/20 01/15/21  Maida Sale, MD  metFORMIN (GLUCOPHAGE-XR) 500 MG 24 hr tablet Take 1 tablet (500 mg total) by mouth daily with breakfast. 12/16/20 01/15/21  Maida Sale, MD  metoprolol succinate (TOPROL-XL) 25 MG 24 hr tablet Take 1 tablet (25 mg total) by mouth daily. 12/16/20 01/15/21  Maida Sale,  MD  Multiple Vitamin (MULTIVITAMIN WITH MINERALS) TABS tablet Take 1 tablet by mouth daily.    [provider]  nicotine polacrilex (NICORETTE) 2 MG gum Take 1 each (2 mg total) by mouth as needed for smoking cessation. 12/16/20   Maida Sale, MD  pantoprazole (PROTONIX) 40 MG tablet Take 1 tablet (40 mg total) by mouth daily. 10/12/20 12/11/20  Richardson Dopp T, PA-C  rosuvastatin (CRESTOR) 40 MG tablet Take 40 mg by mouth daily.    [provider]  traZODone (DESYREL) 50 MG tablet Take 50 mg by mouth at bedtime as needed for sleep.    [provider]    Allergies    Morphine and related  Review of Systems   Review of Systems  Physical Exam Updated Vital Signs BP (!) 161/86 (BP Location: Right Arm)   Pulse 86   Temp 97.7 F (36.5 C) (  Oral)   Resp 18   SpO2 100%   Physical Exam Vitals and nursing note reviewed.  Constitutional:      Appearance: He is well-developed.  HENT:     Head: Normocephalic and atraumatic.     Right Ear: External ear normal.     Left Ear: External ear normal.     Nose: Nose normal.  Eyes:     Extraocular Movements: Extraocular movements intact.  Neck:     Trachea: No tracheal deviation.  Pulmonary:     Effort: Pulmonary effort is normal.  Musculoskeletal:        General: Normal range of motion.  Skin:    General: Skin is warm and dry.  Neurological:     Mental Status: He is alert and oriented to person, place, and time.  Psychiatric:        Attention and Perception: Attention normal.        Mood and Affect: Mood is depressed.        Speech: Speech normal.        Behavior: Behavior normal.        Cognition and Memory: Cognition normal.        Judgment: Judgment normal.    ED Results / Procedures / Treatments   Labs (all labs ordered are listed, but only abnormal results are displayed) Labs Reviewed  COMPREHENSIVE METABOLIC PANEL - Abnormal; Notable for the following components:      Result Value    Sodium 134 (*)    Glucose, Bld 145 (*)    All other components within normal limits  SALICYLATE LEVEL - Abnormal; Notable for the following components:   Salicylate Lvl <7.4 (*)    All other components within normal limits  ACETAMINOPHEN LEVEL - Abnormal; Notable for the following components:   Acetaminophen (Tylenol), Serum <10 (*)    All other components within normal limits  ETHANOL  CBC  RAPID URINE DRUG SCREEN, HOSP PERFORMED    EKG None  Radiology No results found.  Procedures Procedures   Medications Ordered in ED Medications - No data to display  ED Course  I have reviewed the triage vital signs and the nursing notes.  Pertinent labs & imaging results that were available during my care of the patient were reviewed by me and considered in my medical decision making (see chart for details).    MDM Rules/Calculators/A&P                         Patient presents with complaints of pression and suicidality with recent history of same.  He has a plan to shoot himself.  IVC papers are completed.  Patient appears medically cleared for psychiatric evaluation. Final Clinical Impression(s) / ED Diagnoses Final diagnoses:  Suicidal ideation    Rx / DC Orders ED Discharge Orders     None        Pattricia Boss, MD 01/04/21 1231

## 2021-01-04 NOTE — ED Notes (Signed)
Pt changed into paper scrubs and wanded by security.  All belongings bagged and labeled (2 bags) and placed in cabinet #14.

## 2021-01-04 NOTE — ED Notes (Signed)
Called for triage.  Pt in bathroom.

## 2021-01-04 NOTE — ED Notes (Signed)
Pt still in bathroom

## 2021-01-04 NOTE — ED Notes (Signed)
No answer for triage.
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
## 2021-01-04 NOTE — ED Notes (Signed)
Pt given sandwich bag and ginger ale.  

## 2021-01-05 ENCOUNTER — Ambulatory Visit (HOSPITAL_COMMUNITY)
Admission: EM | Admit: 2021-01-05 | Discharge: 2021-01-06 | Disposition: A | Discharge status: Other Acute Inpatient Hospital | Payer: Medicaid Other | Attending: Nurse Practitioner | Admitting: Nurse Practitioner

## 2021-01-05 DIAGNOSIS — F314 Bipolar disorder, current episode depressed, severe, without psychotic features: Secondary | ICD-10-CM

## 2021-01-05 DIAGNOSIS — F1420 Cocaine dependence, uncomplicated: Secondary | ICD-10-CM | POA: Diagnosis not present

## 2021-01-05 DIAGNOSIS — F1220 Cannabis dependence, uncomplicated: Secondary | ICD-10-CM

## 2021-01-05 DIAGNOSIS — R45851 Suicidal ideations: Secondary | ICD-10-CM

## 2021-01-05 DIAGNOSIS — F1994 Other psychoactive substance use, unspecified with psychoactive substance-induced mood disorder: Secondary | ICD-10-CM | POA: Diagnosis present

## 2021-01-05 DIAGNOSIS — F4310 Post-traumatic stress disorder, unspecified: Secondary | ICD-10-CM

## 2021-01-05 MED ORDER — LAMOTRIGINE 25 MG PO TABS
25.0000 mg | ORAL_TABLET | Freq: Every day | ORAL | Status: DC
  Administered 2021-01-06: 25 mg via ORAL
  Filled 2021-01-05: qty 1

## 2021-01-05 MED ORDER — ACETAMINOPHEN 325 MG PO TABS: 650.0000 mg | ORAL_TABLET | Freq: Four times a day (QID) | ORAL | Status: DC | PRN

## 2021-01-05 MED ORDER — ALBUTEROL SULFATE HFA 108 (90 BASE) MCG/ACT IN AERS: 1.0000 | INHALATION_SPRAY | Freq: Four times a day (QID) | RESPIRATORY_TRACT | Status: DC | PRN

## 2021-01-05 MED ORDER — GABAPENTIN 100 MG PO CAPS
200.0000 mg | ORAL_CAPSULE | Freq: Three times a day (TID) | ORAL | Status: DC
  Administered 2021-01-06 (×3): 200 mg via ORAL
  Filled 2021-01-05 (×3): qty 2

## 2021-01-05 MED ORDER — ASPIRIN EC 81 MG PO TBEC
81.0000 mg | DELAYED_RELEASE_TABLET | Freq: Every day | ORAL | Status: DC
  Administered 2021-01-06: 81 mg via ORAL
  Filled 2021-01-05: qty 1

## 2021-01-05 MED ORDER — PANTOPRAZOLE SODIUM 40 MG PO TBEC
40.0000 mg | DELAYED_RELEASE_TABLET | Freq: Every day | ORAL | Status: DC
  Administered 2021-01-06: 40 mg via ORAL
  Filled 2021-01-05: qty 1

## 2021-01-05 MED ORDER — ROSUVASTATIN CALCIUM 20 MG PO TABS
40.0000 mg | ORAL_TABLET | Freq: Every day | ORAL | Status: DC
  Administered 2021-01-06: 40 mg via ORAL
  Filled 2021-01-05: qty 2

## 2021-01-05 MED ORDER — ALUM & MAG HYDROXIDE-SIMETH 200-200-20 MG/5ML PO SUSP: 30.0000 mL | ORAL | Status: DC | PRN

## 2021-01-05 MED ORDER — DULOXETINE HCL 20 MG PO CPEP
20.0000 mg | ORAL_CAPSULE | Freq: Every day | ORAL | Status: DC
  Administered 2021-01-06: 20 mg via ORAL
  Filled 2021-01-05: qty 1

## 2021-01-05 MED ORDER — CLOPIDOGREL BISULFATE 75 MG PO TABS
75.0000 mg | ORAL_TABLET | Freq: Every day | ORAL | Status: DC
  Administered 2021-01-06: 75 mg via ORAL
  Filled 2021-01-05: qty 1

## 2021-01-05 MED ORDER — METFORMIN HCL ER 500 MG PO TB24: 1000.0000 mg | ORAL_TABLET | Freq: Every day | ORAL | Status: DC

## 2021-01-05 MED ORDER — MAGNESIUM HYDROXIDE 400 MG/5ML PO SUSP: 30.0000 mL | Freq: Every day | ORAL | Status: DC | PRN

## 2021-01-05 MED ORDER — POLYETHYLENE GLYCOL 3350 17 G PO PACK
17.0000 g | PACK | Freq: Once | ORAL | Status: AC
  Administered 2021-01-05: 17 g via ORAL
  Filled 2021-01-05: qty 1

## 2021-01-05 MED ORDER — METFORMIN HCL ER 500 MG PO TB24
500.0000 mg | ORAL_TABLET | Freq: Every day | ORAL | Status: DC
  Administered 2021-01-06: 500 mg via ORAL
  Filled 2021-01-05: qty 1

## 2021-01-05 MED ORDER — METOPROLOL SUCCINATE ER 25 MG PO TB24
25.0000 mg | ORAL_TABLET | Freq: Every day | ORAL | Status: DC
  Administered 2021-01-06: 25 mg via ORAL
  Filled 2021-01-05: qty 1

## 2021-01-05 MED ORDER — ARIPIPRAZOLE 5 MG PO TABS
5.0000 mg | ORAL_TABLET | Freq: Every day | ORAL | Status: DC
  Administered 2021-01-06: 5 mg via ORAL
  Filled 2021-01-05: qty 1

## 2021-01-05 MED ORDER — TRAZODONE HCL 50 MG PO TABS: 50.0000 mg | ORAL_TABLET | Freq: Every evening | ORAL | Status: DC | PRN

## 2021-01-05 NOTE — ED Notes (Signed)
Breakfast tray at bedside 

## 2021-01-05 NOTE — ED Notes (Signed)
Patient in with TTS assessment. Continues to endorse SI and multiple chronic issues. Sitter at doorway while patient has privacy

## 2021-01-05 NOTE — ED Notes (Signed)
Patient resting quietly on bed in hallways.  Respirations even and unlabored.

## 2021-01-05 NOTE — ED Notes (Signed)
Pt currently sleeping

## 2021-01-05 NOTE — ED Notes (Signed)
Called [facility] to transport patient to [facility]

## 2021-01-05 NOTE — ED Provider Notes (Signed)
Emergency Medicine Observation Re-evaluation Note  [name] is a 65 y.o. male, seen on rounds today.  Pt initially presented to the ED for complaints of Suicidal (Pt stated that he hs suicidal with plan to shoot self.  He said he feels that someone is trying to get him.) Currently, the patient is calm sitting up in bed.  Physical Exam  BP 131/85 (BP Location: Right Arm)   Pulse 71   Temp 98.7 F (37.1 C) (Oral)   Resp 15   SpO2 98%  Physical Exam General: No acute distress Cardiac: Well perfused Lungs: Nonlabored Psych: Cooperative  ED Course / MDM  EKG:   I have reviewed the labs performed to date as well as medications administered while in observation.  Recent changes in the last 24 hours include TTS evaluation.  Plan  Current plan is for inpatient psychiatric hospitalization.  [name] is not under involuntary commitment.     Kevin Rasmussen, MD 01/05/21 [phone]

## 2021-01-05 NOTE — ED Provider Notes (Signed)
Behavioral Health Admission H&P [facility] & OBS)  Date: 01/05/21 Patient Name: [name] MRN: [id number] Chief Complaint: No chief complaint on file.     Diagnoses:  Final diagnoses:  Bipolar affective disorder, depressed, severe ([place])  Cocaine use disorder, severe, dependence (HCC)    HPI: [name] is a 65 year old male with a past medical history of major depressive disorder, chronic arthritis of both knees, diabetes, hypertension, hyperlipidemia, CHF,renal cell carcinoma, and coronary artery disease s/p placement of two stents in August 2022 who presented to [facility] with SI. Patient was recommended for inpatient treatment by Delrae Sawyers, NP. Unclear why the patient was transferred to [facility].  On evaluation at [facility], the patient is alert and oriented x 4, pleasant, and cooperative. Speech is clear and coherent. Mood is depressed and affect is congruent with mood. Thought process is coherent and thought content is logical. Denies auditory and visual hallucinations. No indication that patient is responding to internal stimuli. No evidence of delusional thought content. Continues to express SI with plan to shoot himself. States that he does not have a gun but has access to an illegal gun. Denies homicidal ideations. Reports daily use of crack/cocaine. Las use 2 days ago. Reports occasional use of alcohol and marijuana. Denies use of other illicit substances.    Patient was inpatient at [facility] 12/08/2020-12/16/2020. He was discharged on abilify 5 mg daily, duloxetine 20 mg daily, gabapentin 200 mg TID, lamictal 25 mg daily, and trasodone 50 mg QHS prn.  Telepsych Consult 01/05/2021: [name] is a 65 y.o. male patient admitted with suicidal ideations with a plan to shoot himself with a firearm. His urine drug screen was positive for cocaine.  Today he states "I'm still going to hurt myself if you send me home."   Patient seen via telepsych by this provider; chart reviewed and  consulted with Dr. Dwyane Dee on 01/05/21.  On evaluation [name] reports  he's depressed and cannot find a reason to live.  Endorses hopelessness, despair and "recreational drug use"  His UDS is positive for cocaine, however patient states this does not alter his mood or cause exacerbation of his mental illness.  He's known to our facility for similar presentation, last inpatient with [facility] was 4 years ago.  He was restarted on his home psychotropic medications, tolerated well no side effects.  Sleep and appetite are good. He's pretty consistent with his SI with plan, cannot contract for safety today.      PHQ 2-9:  [place] ED from 01/04/2021 in [place]  Thoughts that you would be better off dead, or of hurting yourself in some way More than half the days  PHQ-9 Total Score 12       [place] ED from 01/04/2021 in [place] Admission (Discharged) from 12/08/2020 in [place] [facility] ED from 12/05/2020 in [place] DEPT  C-SSRS RISK CATEGORY High Risk High Risk Error: Q7 should not be populated when Q6 is No        Total Time spent with patient: 20 minutes  Musculoskeletal  Strength & Muscle Tone: within normal limits Gait & Station: normal Patient leans: N/A  Psychiatric Specialty Exam  Presentation General Appearance: Appropriate for Environment; Fairly Groomed  Eye Contact:Good  Speech:Clear and Coherent; Normal Rate  Speech Volume:Normal  Handedness:Right   Mood and Affect  Mood:Depressed  Affect:Congruent; Depressed   Thought Process  Thought Processes:Coherent  Descriptions of  Associations:Intact  Orientation:Full (Time, Place and Person)  Thought Content:Logical  Diagnosis of Schizophrenia or Schizoaffective disorder in past: No   Hallucinations:Hallucinations: None  Ideas of Reference:None  Suicidal  Thoughts:Suicidal Thoughts: Yes, Active SI Active Intent and/or Plan: With Intent; With Plan; With Means to Carry Out  Homicidal Thoughts:Homicidal Thoughts: Yes, Passive HI Passive Intent and/or Plan: Without Intent; Without Plan   Sensorium  Memory:Immediate Good; Recent Good; Remote Good  Judgment:Impaired  Insight:Lacking   Executive Functions  Concentration:Good  Attention Span:Good  [place]  Language:Good   Psychomotor Activity  Psychomotor Activity:Psychomotor Activity: Normal   Assets  Assets:Communication Skills; Desire for Improvement   Sleep  Sleep:Sleep: Good Number of Hours of Sleep: 8   Nutritional Assessment (For OBS and FBC admissions only) Has the patient had a weight loss or gain of 10 pounds or more in the last 3 months?: No Has the patient had a decrease in food intake/or appetite?: No Does the patient have dental problems?: No Does the patient have eating habits or behaviors that may be indicators of an eating disorder including binging or inducing vomiting?: No Has the patient recently lost weight without trying?: 0 Has the patient been eating poorly because of a decreased appetite?: 0 Malnutrition Screening Tool Score: 0   Physical Exam Constitutional:      General: He is not in acute distress.    Appearance: He is not ill-appearing, toxic-appearing or diaphoretic.  HENT:     Head: Normocephalic.     Right Ear: External ear normal.     Left Ear: External ear normal.  Eyes:     Pupils: Pupils are equal, round, and reactive to light.  Cardiovascular:     Rate and Rhythm: Normal rate.  Pulmonary:     Effort: Pulmonary effort is normal. No respiratory distress.  Musculoskeletal:        General: Normal range of motion.  Skin:    General: Skin is warm and dry.  Neurological:     Mental Status: He is alert and oriented to person, place, and time.  Psychiatric:        Mood and Affect: Mood is depressed.         Speech: Speech normal.        Behavior: Behavior is cooperative.        Thought Content: Thought content is not paranoid or delusional. Thought content includes homicidal and suicidal ideation. Thought content includes suicidal plan. Thought content does not include homicidal plan.   Review of Systems  Constitutional:  Negative for chills, diaphoresis, fever, malaise/fatigue and weight loss.  HENT:  Negative for congestion.   Respiratory:  Negative for cough and shortness of breath.   Cardiovascular:  Negative for chest pain and palpitations.  Gastrointestinal:  Negative for diarrhea, nausea and vomiting.  Neurological:  Negative for dizziness and seizures.  Psychiatric/Behavioral:  Positive for depression, substance abuse and suicidal ideas. Negative for hallucinations and memory loss. The patient is nervous/anxious and has insomnia.   All other systems reviewed and are negative.  Blood pressure 134/75, pulse 76, temperature 98.5 F (36.9 C), resp. rate 18, SpO2 100 %. There is no height or weight on file to calculate BMI.  Past Psychiatric History: Patient was told he had bipolar disorder in 1991 but at one point was told he could have PTSD or schizophrenia. He was on SSDI for bipolar diagnosis until SSDI converted to regular social security as he aged. He has had psychiatric hospitalizations at  [facility] but does not recall dates of treatments. He currently receives psychiatric care through the [facility]. Patient says he started receiving care at the [facility] in the 1990s. He started in [place], [place], but has also received care in [place], other cities in [place], and various cities in [place] and [place]. He was going to commit suicide in 2005-2006 at which time he planned to jump of a bridge because he was struggling with finances, but a Engineer, structural found him and the attempt was prevented. According to EHR he previously reported suicide attempts via jumping into traffic and jumping from  a moving vehicle. He has been on various medications in the past including Zoloft, Prozac, and Haldol. Per records, he has also previously been on Neurontin, Abilify, Lamictal, and Cymbalta.    Is the patient at risk to self? Yes  Has the patient been a risk to self in the past 6 months? Yes .    Has the patient been a risk to self within the distant past? Yes   Is the patient a risk to others? No   Has the patient been a risk to others in the past 6 months? No   Has the patient been a risk to others within the distant past? No   Past Medical History:  Past Medical History:  Diagnosis Date   Anxiety    Arthritis    Cancer ([place])    Cataract    Depression    Diabetes mellitus    GERD (gastroesophageal reflux disease)    H/O suicide attempt 1997 and 2011   jumped out of moving vehicle and attempted to jump into moving traffic   High cholesterol    Hypertension    Neuropathic pain    Osteoporosis     Past Surgical History:  Procedure Laterality Date   COLONOSCOPY     CORONARY STENT INTERVENTION N/A 10/10/2020   Procedure: CORONARY STENT INTERVENTION;  Surgeon: Jettie Booze, MD;  Location: [facility];  Service: Cardiovascular;  Laterality: N/A;   LEFT HEART CATH AND CORONARY ANGIOGRAPHY N/A 10/10/2020   Procedure: LEFT HEART CATH AND CORONARY ANGIOGRAPHY;  Surgeon: Jettie Booze, MD;  Location: [facility];  Service: Cardiovascular;  Laterality: N/A;   NEPHRECTOMY     due to cancer   RIGHT/LEFT HEART CATH AND CORONARY ANGIOGRAPHY N/A 10/09/2020   Procedure: RIGHT/LEFT HEART CATH AND CORONARY ANGIOGRAPHY;  Surgeon: Nelva Bush, MD;  Location: [facility];  Service: Cardiovascular;  Laterality: N/A;    Family History:  Family History  Problem Relation Age of Onset   Alcohol abuse Sister    Alcohol abuse Brother    Colon cancer Neg Hx    Esophageal cancer Neg Hx    Pancreatic cancer Neg Hx    Rectal cancer Neg Hx    Stomach cancer Neg  Hx     Social History:  Social History   Socioeconomic History   Marital status: Single    Spouse name: Not on file   Number of children: Not on file   Years of education: Not on file   Highest education level: Not on file  Occupational History   Not on file  Tobacco Use   Smoking status: Every Day    Packs/day: 0.50    Years: 20.00    Pack years: 10.00    Types: Cigarettes   Smokeless tobacco: Never  Vaping Use   Vaping Use: Never used  Substance and Sexual Activity  Alcohol use: Yes    Alcohol/week: 3.0 standard drinks    Types: 3 Cans of beer per week    Comment: Pt has been clean for 8 months   Drug use: Yes    Types: "Crack" cocaine, Cocaine   Sexual activity: Yes    Birth control/protection: Condom  Other Topics Concern   Not on file  Social History Narrative   Lives alone.  Has sister in area.   Social Determinants of Health   Financial Resource Strain: Not on file  Food Insecurity: Not on file  Transportation Needs: Not on file  Physical Activity: Not on file  Stress: Not on file  Social Connections: Not on file  Intimate Partner Violence: Not on file    SDOH:  SDOH Screenings   Alcohol Screen: Low Risk    Last Alcohol Screening Score (AUDIT): 5  Depression (PHQ2-9): Medium Risk   PHQ-2 Score: 12  Financial Resource Strain: Not on file  Food Insecurity: Not on file  Housing: Not on file  Physical Activity: Not on file  Social Connections: Not on file  Stress: Not on file  Tobacco Use: High Risk   Smoking Tobacco Use: Every Day   Smokeless Tobacco Use: Never   Passive Exposure: Not on file  Transportation Needs: Not on file    Last Labs:  Admission on 01/04/2021, Discharged on 01/05/2021  Component Date Value Ref Range Status   Sodium 01/04/2021 134 (L)  135 - 145 mmol/L Final   Potassium 01/04/2021 3.7  3.5 - 5.1 mmol/L Final   Chloride 01/04/2021 104  98 - 111 mmol/L Final   CO2 01/04/2021 25  22 - 32 mmol/L Final   Glucose, Bld  01/04/2021 145 (H)  70 - 99 mg/dL Final   Glucose reference range applies only to samples taken after fasting for at least 8 hours.   BUN 01/04/2021 13  8 - 23 mg/dL Final   Creatinine, Ser 01/04/2021 0.95  0.61 - 1.24 mg/dL Final   Calcium 01/04/2021 8.9  8.9 - 10.3 mg/dL Final   Total Protein 01/04/2021 6.7  6.5 - 8.1 g/dL Final   Albumin 01/04/2021 3.7  3.5 - 5.0 g/dL Final   AST 01/04/2021 25  15 - 41 U/L Final   ALT 01/04/2021 23  0 - 44 U/L Final   Alkaline Phosphatase 01/04/2021 64  38 - 126 U/L Final   Total Bilirubin 01/04/2021 0.5  0.3 - 1.2 mg/dL Final   GFR, Estimated 01/04/2021 >60  >60 mL/min Final   Comment: (NOTE) Calculated using the CKD-EPI Creatinine Equation (2021)    Anion gap 01/04/2021 5  5 - 15 Final   Performed at [facility] [address], [place], [place] [address]   Alcohol, Ethyl (B) 01/04/2021 <10  <10 mg/dL Final   Comment: (NOTE) Lowest detectable limit for serum alcohol is 10 mg/dL.  For medical purposes only. Performed at [facility], [place] [address] [place], [place] [address]    Salicylate Lvl 97/58/8325 <7.0 (L)  7.0 - 30.0 mg/dL Final   Performed at [facility] [address] [place], [place] [address]   Acetaminophen (Tylenol), Serum 01/04/2021 <10 (L)  10 - 30 ug/mL Final   Comment: (NOTE) Therapeutic concentrations vary significantly. A range of 10-30 ug/mL  may be an effective concentration for many patients. However, some  are best treated at concentrations outside of this range. Acetaminophen concentrations >150 ug/mL at 4 hours after ingestion  and >  50 ug/mL at 12 hours after ingestion are often associated with  toxic reactions.  Performed at [facility], [place] [address] [place], [place] [address]    WBC 01/04/2021 4.6  4.0 - 10.5 K/uL Final   RBC 01/04/2021 4.90  4.22 - 5.81 MIL/uL Final   Hemoglobin 01/04/2021 14.0  13.0 - 17.0 g/dL Final   HCT 01/04/2021 42.3  39.0 - 52.0 % Final   MCV 01/04/2021  86.3  80.0 - 100.0 fL Final   MCH 01/04/2021 28.6  26.0 - 34.0 pg Final   MCHC 01/04/2021 33.1  30.0 - 36.0 g/dL Final   RDW 01/04/2021 14.4  11.5 - 15.5 % Final   Platelets 01/04/2021 204  150 - 400 K/uL Final   nRBC 01/04/2021 0.0  0.0 - 0.2 % Final   Performed at [facility] [address], [place], [place] [address]   Opiates 01/04/2021 NONE DETECTED  NONE DETECTED Final   Cocaine 01/04/2021 POSITIVE (A)  NONE DETECTED Final   Benzodiazepines 01/04/2021 NONE DETECTED  NONE DETECTED Final   Amphetamines 01/04/2021 NONE DETECTED  NONE DETECTED Final   Tetrahydrocannabinol 01/04/2021 POSITIVE (A)  NONE DETECTED Final   Barbiturates 01/04/2021 NONE DETECTED  NONE DETECTED Final   Comment: (NOTE) DRUG SCREEN FOR MEDICAL PURPOSES ONLY.  IF CONFIRMATION IS NEEDED FOR ANY PURPOSE, NOTIFY LAB WITHIN 5 DAYS.  LOWEST DETECTABLE LIMITS FOR URINE DRUG SCREEN Drug Class                     Cutoff (ng/mL) Amphetamine and metabolites    1000 Barbiturate and metabolites    200 Benzodiazepine                 762 Tricyclics and metabolites     300 Opiates and metabolites        300 Cocaine and metabolites        300 THC                            50 Performed at [facility], [place] [address], [place], [place] [address]    SARS Coronavirus 2 by RT PCR 01/04/2021 NEGATIVE  NEGATIVE Final   Comment: (NOTE) SARS-CoV-2 target nucleic acids are NOT DETECTED.  The SARS-CoV-2 RNA is generally detectable in upper respiratory specimens during the acute phase of infection. The lowest concentration of SARS-CoV-2 viral copies this assay can detect is 138 copies/mL. A negative result does not preclude SARS-Cov-2 infection and should not be used as the sole basis for treatment or other patient management decisions. A negative result may occur with  improper specimen collection/handling, submission of specimen other than nasopharyngeal swab, presence of viral mutation(s) within the areas  targeted by this assay, and inadequate number of viral copies(<138 copies/mL). A negative result must be combined with clinical observations, patient history, and epidemiological information. The expected result is Negative.  Fact Sheet for Patients:  EntrepreneurPulse.com.au  Fact Sheet for Healthcare Providers:  IncredibleEmployment.be  This test is no                          t yet approved or cleared by the [place] FDA and  has been authorized for detection and/or diagnosis of SARS-CoV-2 by FDA under an Emergency Use Authorization (EUA). This EUA will remain  in effect (meaning this test can be used) for the duration of  the COVID-19 declaration under Section 564(b)(1) of the Act, 21 U.S.C.section 360bbb-3(b)(1), unless the authorization is terminated  or revoked sooner.       Influenza A by PCR 01/04/2021 NEGATIVE  NEGATIVE Final   Influenza B by PCR 01/04/2021 NEGATIVE  NEGATIVE Final   Comment: (NOTE) The Xpert Xpress SARS-CoV-2/FLU/RSV plus assay is intended as an aid in the diagnosis of influenza from Nasopharyngeal swab specimens and should not be used as a sole basis for treatment. Nasal washings and aspirates are unacceptable for Xpert Xpress SARS-CoV-2/FLU/RSV testing.  Fact Sheet for Patients: EntrepreneurPulse.com.au  Fact Sheet for Healthcare Providers: IncredibleEmployment.be  This test is not yet approved or cleared by the [place] FDA and has been authorized for detection and/or diagnosis of SARS-CoV-2 by FDA under an Emergency Use Authorization (EUA). This EUA will remain in effect (meaning this test can be used) for the duration of the COVID-19 declaration under Section 564(b)(1) of the Act, 21 U.S.C. section 360bbb-3(b)(1), unless the authorization is terminated or revoked.  Performed at [facility], [place] [address], [place], [place] [address]     Glucose-Capillary 01/04/2021 150 (H)  70 - 99 mg/dL Final   Glucose reference range applies only to samples taken after fasting for at least 8 hours.  Admission on 12/08/2020, Discharged on 12/16/2020  Component Date Value Ref Range Status   Glucose-Capillary 12/09/2020 172 (H)  70 - 99 mg/dL Final   Glucose reference range applies only to samples taken after fasting for at least 8 hours.   TSH 12/10/2020 0.977  0.350 - 4.500 uIU/mL Final   Comment: Performed by a 3rd Generation assay with a functional sensitivity of <=0.01 uIU/mL. Performed at [facility], [place] [address] [place], [place] [address]    Hgb A1c MFr Bld 12/10/2020 6.8 (H)  4.8 - 5.6 % Final   Comment: (NOTE) Pre diabetes:          5.7%-6.4%  Diabetes:              >6.4%  Glycemic control for   <7.0% adults with diabetes    Mean Plasma Glucose 12/10/2020 148.46  mg/dL Final   Performed at [facility], [place] [address] [place], [place] [address]   Cholesterol 12/10/2020 177  0 - 200 mg/dL Final   Triglycerides 12/10/2020 124  <150 mg/dL Final   HDL 12/10/2020 50  >40 mg/dL Final   Total CHOL/HDL Ratio 12/10/2020 3.5  RATIO Final   VLDL 12/10/2020 25  0 - 40 mg/dL Final   LDL Cholesterol 12/10/2020 102 (H)  0 - 99 mg/dL Final   Comment:        Total Cholesterol/HDL:CHD Risk Coronary Heart Disease Risk Table                     Men   Women  1/2 Average Risk   3.4   3.3  Average Risk       5.0   4.4  2 X Average Risk   9.6   7.1  3 X Average Risk  23.4   11.0        Use the calculated Patient Ratio above and the CHD Risk Table to determine the patient's CHD Risk.        ATP III CLASSIFICATION (LDL):  <100     mg/dL   Optimal  100-129  mg/dL   Near or Above  Optimal  130-159  mg/dL   Borderline  160-189  mg/dL   High  >190     mg/dL   Very High Performed at [facility] [address] [place], [place] [address]    Glucose-Capillary 12/09/2020  191 (H)  70 - 99 mg/dL Final   Glucose reference range applies only to samples taken after fasting for at least 8 hours.   Glucose-Capillary 12/10/2020 118 (H)  70 - 99 mg/dL Final   Glucose reference range applies only to samples taken after fasting for at least 8 hours.   Glucose-Capillary 12/10/2020 136 (H)  70 - 99 mg/dL Final   Glucose reference range applies only to samples taken after fasting for at least 8 hours.   Glucose-Capillary 12/10/2020 125 (H)  70 - 99 mg/dL Final   Glucose reference range applies only to samples taken after fasting for at least 8 hours.   Glucose-Capillary 12/11/2020 96  70 - 99 mg/dL Final   Glucose reference range applies only to samples taken after fasting for at least 8 hours.   Glucose-Capillary 12/11/2020 92  70 - 99 mg/dL Final   Glucose reference range applies only to samples taken after fasting for at least 8 hours.   SARS Coronavirus 2 by RT PCR 12/11/2020 NEGATIVE  NEGATIVE Final   Comment: (NOTE) SARS-CoV-2 target nucleic acids are NOT DETECTED.  The SARS-CoV-2 RNA is generally detectable in upper respiratory specimens during the acute phase of infection. The lowest concentration of SARS-CoV-2 viral copies this assay can detect is 138 copies/mL. A negative result does not preclude SARS-Cov-2 infection and should not be used as the sole basis for treatment or other patient management decisions. A negative result may occur with  improper specimen collection/handling, submission of specimen other than nasopharyngeal swab, presence of viral mutation(s) within the areas targeted by this assay, and inadequate number of viral copies(<138 copies/mL). A negative result must be combined with clinical observations, patient history, and epidemiological information. The expected result is Negative.  Fact Sheet for Patients:  EntrepreneurPulse.com.au  Fact Sheet for Healthcare Providers:   IncredibleEmployment.be  This test is no                          t yet approved or cleared by the [place] FDA and  has been authorized for detection and/or diagnosis of SARS-CoV-2 by FDA under an Emergency Use Authorization (EUA). This EUA will remain  in effect (meaning this test can be used) for the duration of the COVID-19 declaration under Section 564(b)(1) of the Act, 21 U.S.C.section 360bbb-3(b)(1), unless the authorization is terminated  or revoked sooner.       Influenza A by PCR 12/11/2020 NEGATIVE  NEGATIVE Final   Influenza B by PCR 12/11/2020 NEGATIVE  NEGATIVE Final   Comment: (NOTE) The Xpert Xpress SARS-CoV-2/FLU/RSV plus assay is intended as an aid in the diagnosis of influenza from Nasopharyngeal swab specimens and should not be used as a sole basis for treatment. Nasal washings and aspirates are unacceptable for Xpert Xpress SARS-CoV-2/FLU/RSV testing.  Fact Sheet for Patients: EntrepreneurPulse.com.au  Fact Sheet for Healthcare Providers: IncredibleEmployment.be  This test is not yet approved or cleared by the [place] FDA and has been authorized for detection and/or diagnosis of SARS-CoV-2 by FDA under an Emergency Use Authorization (EUA). This EUA will remain in effect (meaning this test can be used) for the duration of the COVID-19 declaration under Section 564(b)(1) of the  Act, 21 U.S.C. section 360bbb-3(b)(1), unless the authorization is terminated or revoked.  Performed at [facility], [place] [address], [place], [place] [address]    Glucose-Capillary 12/11/2020 107 (H)  70 - 99 mg/dL Final   Glucose reference range applies only to samples taken after fasting for at least 8 hours.   Glucose-Capillary 12/12/2020 114 (H)  70 - 99 mg/dL Final   Glucose reference range applies only to samples taken after fasting for at least 8 hours.   Comment 1 12/12/2020 Notify RN    Final   Glucose-Capillary 12/12/2020 149 (H)  70 - 99 mg/dL Final   Glucose reference range applies only to samples taken after fasting for at least 8 hours.   Glucose-Capillary 12/12/2020 138 (H)  70 - 99 mg/dL Final   Glucose reference range applies only to samples taken after fasting for at least 8 hours.   Glucose-Capillary 12/13/2020 114 (H)  70 - 99 mg/dL Final   Glucose reference range applies only to samples taken after fasting for at least 8 hours.   Glucose-Capillary 12/13/2020 107 (H)  70 - 99 mg/dL Final   Glucose reference range applies only to samples taken after fasting for at least 8 hours.   Glucose-Capillary 12/14/2020 139 (H)  70 - 99 mg/dL Final   Glucose reference range applies only to samples taken after fasting for at least 8 hours.   Glucose-Capillary 12/14/2020 105 (H)  70 - 99 mg/dL Final   Glucose reference range applies only to samples taken after fasting for at least 8 hours.   Glucose-Capillary 12/15/2020 121 (H)  70 - 99 mg/dL Final   Glucose reference range applies only to samples taken after fasting for at least 8 hours.   Glucose-Capillary 12/16/2020 182 (H)  70 - 99 mg/dL Final   Glucose reference range applies only to samples taken after fasting for at least 8 hours.   Comment 1 12/16/2020 Notify RN   Final   Glucose-Capillary 12/15/2020 172 (H)  70 - 99 mg/dL Final   Glucose reference range applies only to samples taken after fasting for at least 8 hours.   Comment 1 12/15/2020 Notify RN   Final  Admission on 12/05/2020, Discharged on 12/08/2020  Component Date Value Ref Range Status   SARS Coronavirus 2 by RT PCR 12/05/2020 NEGATIVE  NEGATIVE Final   Comment: (NOTE) SARS-CoV-2 target nucleic acids are NOT DETECTED.  The SARS-CoV-2 RNA is generally detectable in upper respiratory specimens during the acute phase of infection. The lowest concentration of SARS-CoV-2 viral copies this assay can detect is 138 copies/mL. A negative result does not  preclude SARS-Cov-2 infection and should not be used as the sole basis for treatment or other patient management decisions. A negative result may occur with  improper specimen collection/handling, submission of specimen other than nasopharyngeal swab, presence of viral mutation(s) within the areas targeted by this assay, and inadequate number of viral copies(<138 copies/mL). A negative result must be combined with clinical observations, patient history, and epidemiological information. The expected result is Negative.  Fact Sheet for Patients:  EntrepreneurPulse.com.au  Fact Sheet for Healthcare Providers:  IncredibleEmployment.be  This test is no                          t yet approved or cleared by the [place] FDA and  has been authorized for detection and/or diagnosis of SARS-CoV-2 by FDA under an Emergency Use Authorization (EUA). This  EUA will remain  in effect (meaning this test can be used) for the duration of the COVID-19 declaration under Section 564(b)(1) of the Act, 21 U.S.C.section 360bbb-3(b)(1), unless the authorization is terminated  or revoked sooner.       Influenza A by PCR 12/05/2020 NEGATIVE  NEGATIVE Final   Influenza B by PCR 12/05/2020 NEGATIVE  NEGATIVE Final   Comment: (NOTE) The Xpert Xpress SARS-CoV-2/FLU/RSV plus assay is intended as an aid in the diagnosis of influenza from Nasopharyngeal swab specimens and should not be used as a sole basis for treatment. Nasal washings and aspirates are unacceptable for Xpert Xpress SARS-CoV-2/FLU/RSV testing.  Fact Sheet for Patients: EntrepreneurPulse.com.au  Fact Sheet for Healthcare Providers: IncredibleEmployment.be  This test is not yet approved or cleared by the [place] FDA and has been authorized for detection and/or diagnosis of SARS-CoV-2 by FDA under an Emergency Use Authorization (EUA). This EUA will remain in  effect (meaning this test can be used) for the duration of the COVID-19 declaration under Section 564(b)(1) of the Act, 21 U.S.C. section 360bbb-3(b)(1), unless the authorization is terminated or revoked.  Performed at [facility], [place] [address], [name], [place] [address]    Sodium 12/05/2020 135  135 - 145 mmol/L Final   Potassium 12/05/2020 3.9  3.5 - 5.1 mmol/L Final   Chloride 12/05/2020 105  98 - 111 mmol/L Final   CO2 12/05/2020 24  22 - 32 mmol/L Final   Glucose, Bld 12/05/2020 222 (H)  70 - 99 mg/dL Final   Glucose reference range applies only to samples taken after fasting for at least 8 hours.   BUN 12/05/2020 11  8 - 23 mg/dL Final   Creatinine, Ser 12/05/2020 1.01  0.61 - 1.24 mg/dL Final   Calcium 12/05/2020 9.0  8.9 - 10.3 mg/dL Final   Total Protein 12/05/2020 6.9  6.5 - 8.1 g/dL Final   Albumin 12/05/2020 3.7  3.5 - 5.0 g/dL Final   AST 12/05/2020 18  15 - 41 U/L Final   ALT 12/05/2020 17  0 - 44 U/L Final   Alkaline Phosphatase 12/05/2020 50  38 - 126 U/L Final   Total Bilirubin 12/05/2020 0.7  0.3 - 1.2 mg/dL Final   GFR, Estimated 12/05/2020 >60  >60 mL/min Final   Comment: (NOTE) Calculated using the CKD-EPI Creatinine Equation (2021)    Anion gap 12/05/2020 6  5 - 15 Final   Performed at [facility], [place] [address] [place], [place] [address]   Alcohol, Ethyl (B) 12/05/2020 <10  <10 mg/dL Final   Comment: (NOTE) Lowest detectable limit for serum alcohol is 10 mg/dL.  For medical purposes only. Performed at [facility], [place] [address], [place],  [address]    Opiates 12/05/2020 NONE DETECTED  NONE DETECTED Final   Cocaine 12/05/2020 POSITIVE (A)  NONE DETECTED Final   Benzodiazepines 12/05/2020 NONE DETECTED  NONE DETECTED Final   Amphetamines 12/05/2020 NONE DETECTED  NONE DETECTED Final   Tetrahydrocannabinol 12/05/2020 NONE DETECTED  NONE DETECTED Final   Barbiturates 12/05/2020 NONE  DETECTED  NONE DETECTED Final   Comment: (NOTE) DRUG SCREEN FOR MEDICAL PURPOSES ONLY.  IF CONFIRMATION IS NEEDED FOR ANY PURPOSE, NOTIFY LAB WITHIN 5 DAYS.  LOWEST DETECTABLE LIMITS FOR URINE DRUG SCREEN Drug Class                     Cutoff (ng/mL) Amphetamine and metabolites    1000 Barbiturate and metabolites  200 Benzodiazepine                 161 Tricyclics and metabolites     300 Opiates and metabolites        300 Cocaine and metabolites        300 THC                            50 Performed at [facility], [place] [address] [place], [place] [address]    WBC 12/05/2020 4.6  4.0 - 10.5 K/uL Final   RBC 12/05/2020 5.17  4.22 - 5.81 MIL/uL Final   Hemoglobin 12/05/2020 14.8  13.0 - 17.0 g/dL Final   HCT 12/05/2020 44.3  39.0 - 52.0 % Final   MCV 12/05/2020 85.7  80.0 - 100.0 fL Final   MCH 12/05/2020 28.6  26.0 - 34.0 pg Final   MCHC 12/05/2020 33.4  30.0 - 36.0 g/dL Final   RDW 12/05/2020 13.9  11.5 - 15.5 % Final   Platelets 12/05/2020 212  150 - 400 K/uL Final   nRBC 12/05/2020 0.0  0.0 - 0.2 % Final   Neutrophils Relative % 12/05/2020 71  % Final   Neutro Abs 12/05/2020 3.3  1.7 - 7.7 K/uL Final   Lymphocytes Relative 12/05/2020 21  % Final   Lymphs Abs 12/05/2020 1.0  0.7 - 4.0 K/uL Final   Monocytes Relative 12/05/2020 7  % Final   Monocytes Absolute 12/05/2020 0.3  0.1 - 1.0 K/uL Final   Eosinophils Relative 12/05/2020 1  % Final   Eosinophils Absolute 12/05/2020 0.1  0.0 - 0.5 K/uL Final   Basophils Relative 12/05/2020 0  % Final   Basophils Absolute 12/05/2020 0.0  0.0 - 0.1 K/uL Final   Immature Granulocytes 12/05/2020 0  % Final   Abs Immature Granulocytes 12/05/2020 0.01  0.00 - 0.07 K/uL Final   Performed at [facility], [place] [address], [place], [place] [address]   Acetaminophen (Tylenol), Serum 12/05/2020 <10 (L)  10 - 30 ug/mL Final   Comment: (NOTE) Therapeutic concentrations vary significantly. A range of 10-30  ug/mL  may be an effective concentration for many patients. However, some  are best treated at concentrations outside of this range. Acetaminophen concentrations >150 ug/mL at 4 hours after ingestion  and >50 ug/mL at 12 hours after ingestion are often associated with  toxic reactions.  Performed at [facility], [place] [address], [place], [place] [address]    Salicylate Lvl 78/29/5621 <7.0 (L)  7.0 - 30.0 mg/dL Final   Performed at [place] [address] [place], [place] [address]   Glucose-Capillary 12/05/2020 191 (H)  70 - 99 mg/dL Final   Glucose reference range applies only to samples taken after fasting for at least 8 hours.   Glucose-Capillary 12/08/2020 107 (H)  70 - 99 mg/dL Final   Glucose reference range applies only to samples taken after fasting for at least 8 hours.   SARS Coronavirus 2 by RT PCR 12/08/2020 NEGATIVE  NEGATIVE Final   Comment: (NOTE) SARS-CoV-2 target nucleic acids are NOT DETECTED.  The SARS-CoV-2 RNA is generally detectable in upper respiratory specimens during the acute phase of infection. The lowest concentration of SARS-CoV-2 viral copies this assay can detect is 138 copies/mL. A negative result does not preclude SARS-Cov-2 infection and should not be used as the sole basis for treatment or other patient management decisions. A  negative result may occur with  improper specimen collection/handling, submission of specimen other than nasopharyngeal swab, presence of viral mutation(s) within the areas targeted by this assay, and inadequate number of viral copies(<138 copies/mL). A negative result must be combined with clinical observations, patient history, and epidemiological information. The expected result is Negative.  Fact Sheet for Patients:  EntrepreneurPulse.com.au  Fact Sheet for Healthcare Providers:  IncredibleEmployment.be  This test is no                           t yet approved or cleared by the [place] FDA and  has been authorized for detection and/or diagnosis of SARS-CoV-2 by FDA under an Emergency Use Authorization (EUA). This EUA will remain  in effect (meaning this test can be used) for the duration of the COVID-19 declaration under Section 564(b)(1) of the Act, 21 U.S.C.section 360bbb-3(b)(1), unless the authorization is terminated  or revoked sooner.       Influenza A by PCR 12/08/2020 NEGATIVE  NEGATIVE Final   Influenza B by PCR 12/08/2020 NEGATIVE  NEGATIVE Final   Comment: (NOTE) The Xpert Xpress SARS-CoV-2/FLU/RSV plus assay is intended as an aid in the diagnosis of influenza from Nasopharyngeal swab specimens and should not be used as a sole basis for treatment. Nasal washings and aspirates are unacceptable for Xpert Xpress SARS-CoV-2/FLU/RSV testing.  Fact Sheet for Patients: EntrepreneurPulse.com.au  Fact Sheet for Healthcare Providers: IncredibleEmployment.be  This test is not yet approved or cleared by the [place] FDA and has been authorized for detection and/or diagnosis of SARS-CoV-2 by FDA under an Emergency Use Authorization (EUA). This EUA will remain in effect (meaning this test can be used) for the duration of the COVID-19 declaration under Section 564(b)(1) of the Act, 21 U.S.C. section 360bbb-3(b)(1), unless the authorization is terminated or revoked.  Performed at [facility], [place] [address] [place], [place] [address]   Admission on 10/09/2020, Discharged on 10/11/2020  Component Date Value Ref Range Status   Glucose-Capillary 10/09/2020 153 (H)  70 - 99 mg/dL Final   Glucose reference range applies only to samples taken after fasting for at least 8 hours.   pH, Ven 10/09/2020 7.381  7.250 - 7.430 Final   pCO2, Ven 10/09/2020 42.8 (L)  44.0 - 60.0 mmHg Final   pO2, Ven 10/09/2020 37.0  32.0 - 45.0 mmHg Final   Bicarbonate 10/09/2020  25.4  20.0 - 28.0 mmol/L Final   TCO2 10/09/2020 27  22 - 32 mmol/L Final   O2 Saturation 10/09/2020 70.0  % Final   Acid-Base Excess 10/09/2020 0.0  0.0 - 2.0 mmol/L Final   Sodium 10/09/2020 143  135 - 145 mmol/L Final   Potassium 10/09/2020 3.8  3.5 - 5.1 mmol/L Final   Calcium, Ion 10/09/2020 1.26  1.15 - 1.40 mmol/L Final   HCT 10/09/2020 41.0  39.0 - 52.0 % Final   Hemoglobin 10/09/2020 13.9  13.0 - 17.0 g/dL Final   Sample type 10/09/2020 MIXED VENOUS SAMPLE   Final   Glucose-Capillary 10/09/2020 127 (H)  70 - 99 mg/dL Final   Glucose reference range applies only to samples taken after fasting for at least 8 hours.   SARS Coronavirus 2 10/09/2020 NEGATIVE  NEGATIVE Final   Comment: (NOTE) SARS-CoV-2 target nucleic acids are NOT DETECTED.  The SARS-CoV-2 RNA is generally detectable in upper and lower respiratory specimens during the acute phase of infection. Negative results do not preclude SARS-CoV-2 infection,  do not rule out co-infections with other pathogens, and should not be used as the sole basis for treatment or other patient management decisions. Negative results must be combined with clinical observations, patient history, and epidemiological information. The expected result is Negative.  Fact Sheet for Patients: SugarRoll.be  Fact Sheet for Healthcare Providers: https://www.woods-mathews.com/  This test is not yet approved or cleared by the [place] FDA and  has been authorized for detection and/or diagnosis of SARS-CoV-2 by FDA under an Emergency Use Authorization (EUA). This EUA will remain  in effect (meaning this test can be used) for the duration of the COVID-19 declaration under Se                          ction 564(b)(1) of the Act, 21 U.S.C. section 360bbb-3(b)(1), unless the authorization is terminated or revoked sooner.  Performed at [facility], [place] [address], [place], [place] [address]     Weight 10/09/2020 3,120  oz Final   Height 10/09/2020 70  in Final   BP 10/09/2020 136/80  mmHg Final   Single Plane A2C EF 10/09/2020 48.1  % Final   Single Plane A4C EF 10/09/2020 35.3  % Final   Calc EF 10/09/2020 44.3  % Final   S' Lateral 10/09/2020 4.50  cm Final   AR max vel 10/09/2020 1.78  cm2 Final   AV Area VTI 10/09/2020 1.71  cm2 Final   AV Mean grad 10/09/2020 3.0  mmHg Final   AV Peak grad 10/09/2020 5.7  mmHg Final   Ao pk vel 10/09/2020 1.19  m/s Final   Area-P 1/2 10/09/2020 4.80  cm2 Final   AV Area mean vel 10/09/2020 1.69  cm2 Final   Glucose-Capillary 10/09/2020 96  70 - 99 mg/dL Final   Glucose reference range applies only to samples taken after fasting for at least 8 hours.   Sodium 10/10/2020 136  135 - 145 mmol/L Final   Potassium 10/10/2020 4.1  3.5 - 5.1 mmol/L Final   Chloride 10/10/2020 105  98 - 111 mmol/L Final   CO2 10/10/2020 24  22 - 32 mmol/L Final   Glucose, Bld 10/10/2020 132 (H)  70 - 99 mg/dL Final   Glucose reference range applies only to samples taken after fasting for at least 8 hours.   BUN 10/10/2020 13  8 - 23 mg/dL Final   Creatinine, Ser 10/10/2020 0.93  0.61 - 1.24 mg/dL Final   Calcium 10/10/2020 9.2  8.9 - 10.3 mg/dL Final   GFR, Estimated 10/10/2020 >60  >60 mL/min Final   Comment: (NOTE) Calculated using the CKD-EPI Creatinine Equation (2021)    Anion gap 10/10/2020 7  5 - 15 Final   Performed at [place] [address], [place], [place] [address]   WBC 10/10/2020 5.2  4.0 - 10.5 K/uL Final   RBC 10/10/2020 5.10  4.22 - 5.81 MIL/uL Final   Hemoglobin 10/10/2020 14.6  13.0 - 17.0 g/dL Final   HCT 10/10/2020 43.0  39.0 - 52.0 % Final   MCV 10/10/2020 84.3  80.0 - 100.0 fL Final   MCH 10/10/2020 28.6  26.0 - 34.0 pg Final   MCHC 10/10/2020 34.0  30.0 - 36.0 g/dL Final   RDW 10/10/2020 14.3  11.5 - 15.5 % Final   Platelets 10/10/2020 173  150 - 400 K/uL Final   nRBC 10/10/2020 0.0  0.0 - 0.2 % Final   Performed at  [facility], [place] [address] [place], [place] [address]   Hgb A1c MFr Bld 10/10/2020 8.2 (H)  4.8 - 5.6 % Final   Comment: (NOTE) Pre diabetes:          5.7%-6.4%  Diabetes:              >6.4%  Glycemic control for   <7.0% adults with diabetes    Mean Plasma Glucose 10/10/2020 188.64  mg/dL Final   Performed at [facility], [place] [address] [place], [place] [address]   Cholesterol 10/10/2020 135  0 - 200 mg/dL Final   Triglycerides 10/10/2020 95  <150 mg/dL Final   HDL 10/10/2020 34 (L)  >40 mg/dL Final   Total CHOL/HDL Ratio 10/10/2020 4.0  RATIO Final   VLDL 10/10/2020 19  0 - 40 mg/dL Final   LDL Cholesterol 10/10/2020 82  0 - 99 mg/dL Final   Comment:        Total Cholesterol/HDL:CHD Risk Coronary Heart Disease Risk Table                     Men   Women  1/2 Average Risk   3.4   3.3  Average Risk       5.0   4.4  2 X Average Risk   9.6   7.1  3 X Average Risk  23.4   11.0        Use the calculated Patient Ratio above and the CHD Risk Table to determine the patient's CHD Risk.        ATP III CLASSIFICATION (LDL):  <100     mg/dL   Optimal  100-129  mg/dL   Near or Above                    Optimal  130-159  mg/dL   Borderline  160-189  mg/dL   High  >190     mg/dL   Very High Performed at [place] [address], [place], [place] [address]    Glucose-Capillary 10/09/2020 179 (H)  70 - 99 mg/dL Final   Glucose reference range applies only to samples taken after fasting for at least 8 hours.   pH, Arterial 10/09/2020 7.428  7.350 - 7.450 Final   pCO2 arterial 10/09/2020 35.2  32.0 - 48.0 mmHg Final   pO2, Arterial 10/09/2020 100  83.0 - 108.0 mmHg Final   Bicarbonate 10/09/2020 23.2  20.0 - 28.0 mmol/L Final   TCO2 10/09/2020 24  22 - 32 mmol/L Final   O2 Saturation 10/09/2020 98.0  % Final   Acid-base deficit 10/09/2020 1.0  0.0 - 2.0 mmol/L Final   Sodium 10/09/2020 142  135 - 145 mmol/L Final   Potassium 10/09/2020 3.8  3.5 - 5.1 mmol/L  Final   Calcium, Ion 10/09/2020 1.23  1.15 - 1.40 mmol/L Final   HCT 10/09/2020 41.0  39.0 - 52.0 % Final   Hemoglobin 10/09/2020 13.9  13.0 - 17.0 g/dL Final   Sample type 10/09/2020 ARTERIAL   Final   Glucose-Capillary 10/10/2020 140 (H)  70 - 99 mg/dL Final   Glucose reference range applies only to samples taken after fasting for at least 8 hours.   Total Protein 10/10/2020 6.0 (L)  6.5 - 8.1 g/dL Final   Albumin 10/10/2020 3.3 (L)  3.5 - 5.0 g/dL Final   AST 10/10/2020 21  15 - 41 U/L Final   ALT 10/10/2020 19  0 - 44 U/L Final   Alkaline Phosphatase 10/10/2020 52  38 - 126 U/L Final   Total Bilirubin 10/10/2020 0.8  0.3 - 1.2 mg/dL Final   Bilirubin, Direct 10/10/2020 0.1  0.0 - 0.2 mg/dL Final   Indirect Bilirubin 10/10/2020 0.7  0.3 - 0.9 mg/dL Final   Performed at [facility], [address], [place], [place] [address]   MRSA, PCR 10/10/2020 NEGATIVE  NEGATIVE Final   Staphylococcus aureus 10/10/2020 NEGATIVE  NEGATIVE Final   Comment: (NOTE) The Xpert SA Assay (FDA approved for NASAL specimens in patients 19 years of age and older), is one component of a comprehensive surveillance program. It is not intended to diagnose infection nor to guide or monitor treatment. Performed at [facility], [address] [place], [place] [address]    Glucose-Capillary 10/10/2020 75  70 - 99 mg/dL Final   Glucose reference range applies only to samples taken after fasting for at least 8 hours.   Glucose-Capillary 10/10/2020 78  70 - 99 mg/dL Final   Glucose reference range applies only to samples taken after fasting for at least 8 hours.   Sodium 10/11/2020 136  135 - 145 mmol/L Final   Potassium 10/11/2020 4.0  3.5 - 5.1 mmol/L Final   Chloride 10/11/2020 105  98 - 111 mmol/L Final   CO2 10/11/2020 21 (L)  22 - 32 mmol/L Final   Glucose, Bld 10/11/2020 224 (H)  70 - 99 mg/dL Final   Glucose reference range applies only to samples taken after fasting for at least 8 hours.    BUN 10/11/2020 14  8 - 23 mg/dL Final   Creatinine, Ser 10/11/2020 1.06  0.61 - 1.24 mg/dL Final   Calcium 84/54/3123 9.2  8.9 - 10.3 mg/dL Final   GFR, Estimated 10/11/2020 >60  >60 mL/min Final   Comment: (NOTE) Calculated using the CKD-EPI Creatinine Equation (2021)    Anion gap 10/11/2020 10  5 - 15 Final   Performed at [facility], [address], [place], [place] [address]   WBC 10/11/2020 4.9  4.0 - 10.5 K/uL Final   RBC 10/11/2020 4.96  4.22 - 5.81 MIL/uL Final   Hemoglobin 10/11/2020 14.0  13.0 - 17.0 g/dL Final   HCT 51/63/1189 41.9  39.0 - 52.0 % Final   MCV 10/11/2020 84.5  80.0 - 100.0 fL Final   MCH 10/11/2020 28.2  26.0 - 34.0 pg Final   MCHC 10/11/2020 33.4  30.0 - 36.0 g/dL Final   RDW 19/35/6090 14.3  11.5 - 15.5 % Final   Platelets 10/11/2020 172  150 - 400 K/uL Final   nRBC 10/11/2020 0.0  0.0 - 0.2 % Final   Performed at [facility], [address], [place], [place] [address]   Glucose-Capillary 10/10/2020 79  70 - 99 mg/dL Final   Glucose reference range applies only to samples taken after fasting for at least 8 hours.   Activated Clotting Time 10/10/2020 202  seconds Final   Activated Clotting Time 10/10/2020 457  seconds Final   Glucose-Capillary 10/10/2020 250 (H)  70 - 99 mg/dL Final   Glucose reference range applies only to samples taken after fasting for at least 8 hours.   Glucose-Capillary 10/11/2020 127 (H)  70 - 99 mg/dL Final   Glucose reference range applies only to samples taken after fasting for at least 8 hours.   Glucose-Capillary 10/10/2020 76  70 - 99 mg/dL Final  Glucose reference range applies only to samples taken after fasting for at least 8 hours.  Office Visit on 10/07/2020  Component Date Value Ref Range Status   Glucose 10/07/2020 193 (H)  65 - 99 mg/dL Final   BUN 10/07/2020 11  8 - 27 mg/dL Final   Creatinine, Ser 10/07/2020 0.95  0.76 - 1.27 mg/dL Final   eGFR 10/07/2020 90  >59 mL/min/1.73 Final   BUN/Creatinine  Ratio 10/07/2020 12  10 - 24 Final   Sodium 10/07/2020 139  134 - 144 mmol/L Final   Potassium 10/07/2020 4.1  3.5 - 5.2 mmol/L Final   Chloride 10/07/2020 102  96 - 106 mmol/L Final   CO2 10/07/2020 24  20 - 29 mmol/L Final   Calcium 10/07/2020 9.7  8.6 - 10.2 mg/dL Final   WBC 10/07/2020 5.0  3.4 - 10.8 x10E3/uL Final   RBC 10/07/2020 5.18  4.14 - 5.80 x10E6/uL Final   Hemoglobin 10/07/2020 14.5  13.0 - 17.7 g/dL Final   Hematocrit 10/07/2020 44.3  37.5 - 51.0 % Final   MCV 10/07/2020 86  79 - 97 fL Final   MCH 10/07/2020 28.0  26.6 - 33.0 pg Final   MCHC 10/07/2020 32.7  31.5 - 35.7 g/dL Final   RDW 10/07/2020 14.6  11.6 - 15.4 % Final   Platelets 10/07/2020 194  150 - 450 x10E3/uL Final  Admission on 08/29/2020, Discharged on 08/30/2020  Component Date Value Ref Range Status   WBC 08/30/2020 5.4  4.0 - 10.5 K/uL Final   RBC 08/30/2020 4.82  4.22 - 5.81 MIL/uL Final   Hemoglobin 08/30/2020 13.4  13.0 - 17.0 g/dL Final   HCT 08/30/2020 41.3  39.0 - 52.0 % Final   MCV 08/30/2020 85.7  80.0 - 100.0 fL Final   MCH 08/30/2020 27.8  26.0 - 34.0 pg Final   MCHC 08/30/2020 32.4  30.0 - 36.0 g/dL Final   RDW 08/30/2020 14.8  11.5 - 15.5 % Final   Platelets 08/30/2020 197  150 - 400 K/uL Final   nRBC 08/30/2020 0.0  0.0 - 0.2 % Final   Neutrophils Relative % 08/30/2020 58  % Final   Neutro Abs 08/30/2020 3.1  1.7 - 7.7 K/uL Final   Lymphocytes Relative 08/30/2020 29  % Final   Lymphs Abs 08/30/2020 1.6  0.7 - 4.0 K/uL Final   Monocytes Relative 08/30/2020 10  % Final   Monocytes Absolute 08/30/2020 0.6  0.1 - 1.0 K/uL Final   Eosinophils Relative 08/30/2020 2  % Final   Eosinophils Absolute 08/30/2020 0.1  0.0 - 0.5 K/uL Final   Basophils Relative 08/30/2020 1  % Final   Basophils Absolute 08/30/2020 0.0  0.0 - 0.1 K/uL Final   Immature Granulocytes 08/30/2020 0  % Final   Abs Immature Granulocytes 08/30/2020 0.01  0.00 - 0.07 K/uL Final   Performed at [facility], [place] [employer]  [address], [place], [place] [address]   Sodium 08/30/2020 138  135 - 145 mmol/L Final   Potassium 08/30/2020 3.8  3.5 - 5.1 mmol/L Final   Chloride 08/30/2020 104  98 - 111 mmol/L Final   CO2 08/30/2020 25  22 - 32 mmol/L Final   Glucose, Bld 08/30/2020 245 (H)  70 - 99 mg/dL Final   Glucose reference range applies only to samples taken after fasting for at least 8 hours.   BUN 08/30/2020 15  8 - 23 mg/dL Final   Creatinine, Ser 08/30/2020 1.09  0.61 - 1.24  mg/dL Final   Calcium 08/30/2020 9.2  8.9 - 10.3 mg/dL Final   GFR, Estimated 08/30/2020 >60  >60 mL/min Final   Comment: (NOTE) Calculated using the CKD-EPI Creatinine Equation (2021)    Anion gap 08/30/2020 9  5 - 15 Final   Performed at [place] [address] [place], [place] [address]   SARS Coronavirus 2 by RT PCR 08/30/2020 NEGATIVE  NEGATIVE Final   Comment: (NOTE) SARS-CoV-2 target nucleic acids are NOT DETECTED.  The SARS-CoV-2 RNA is generally detectable in upper respiratory specimens during the acute phase of infection. The lowest concentration of SARS-CoV-2 viral copies this assay can detect is 138 copies/mL. A negative result does not preclude SARS-Cov-2 infection and should not be used as the sole basis for treatment or other patient management decisions. A negative result may occur with  improper specimen collection/handling, submission of specimen other than nasopharyngeal swab, presence of viral mutation(s) within the areas targeted by this assay, and inadequate number of viral copies(<138 copies/mL). A negative result must be combined with clinical observations, patient history, and epidemiological information. The expected result is Negative.  Fact Sheet for Patients:  EntrepreneurPulse.com.au  Fact Sheet for Healthcare Providers:  IncredibleEmployment.be  This test is no                          t yet approved or cleared by the [place] FDA and  has been  authorized for detection and/or diagnosis of SARS-CoV-2 by FDA under an Emergency Use Authorization (EUA). This EUA will remain  in effect (meaning this test can be used) for the duration of the COVID-19 declaration under Section 564(b)(1) of the Act, 21 U.S.C.section 360bbb-3(b)(1), unless the authorization is terminated  or revoked sooner.       Influenza A by PCR 08/30/2020 NEGATIVE  NEGATIVE Final   Influenza B by PCR 08/30/2020 NEGATIVE  NEGATIVE Final   Comment: (NOTE) The Xpert Xpress SARS-CoV-2/FLU/RSV plus assay is intended as an aid in the diagnosis of influenza from Nasopharyngeal swab specimens and should not be used as a sole basis for treatment. Nasal washings and aspirates are unacceptable for Xpert Xpress SARS-CoV-2/FLU/RSV testing.  Fact Sheet for Patients: EntrepreneurPulse.com.au  Fact Sheet for Healthcare Providers: IncredibleEmployment.be  This test is not yet approved or cleared by the [place] FDA and has been authorized for detection and/or diagnosis of SARS-CoV-2 by FDA under an Emergency Use Authorization (EUA). This EUA will remain in effect (meaning this test can be used) for the duration of the COVID-19 declaration under Section 564(b)(1) of the Act, 21 U.S.C. section 360bbb-3(b)(1), unless the authorization is terminated or revoked.  Performed at [facility], [place] [address], [place], [place] [address]    Lactic Acid, Venous 08/30/2020 1.0  0.5 - 1.9 mmol/L Final   Performed at [facility] [address] [place], [place] [address]   Color, Urine 08/30/2020 YELLOW  YELLOW Final   APPearance 08/30/2020 CLEAR  CLEAR Final   Specific Gravity, Urine 08/30/2020 1.024  1.005 - 1.030 Final   pH 08/30/2020 5.0  5.0 - 8.0 Final   Glucose, UA 08/30/2020 >=500 (A)  NEGATIVE mg/dL Final   Hgb urine dipstick 08/30/2020 NEGATIVE  NEGATIVE Final   Bilirubin Urine 08/30/2020 NEGATIVE  NEGATIVE Final    Ketones, ur 08/30/2020 NEGATIVE  NEGATIVE mg/dL Final   Protein, ur 08/30/2020 NEGATIVE  NEGATIVE mg/dL Final   Nitrite 08/30/2020 NEGATIVE  NEGATIVE  Final   Leukocytes,Ua 08/30/2020 NEGATIVE  NEGATIVE Final   WBC, UA 08/30/2020 0-5  0 - 5 WBC/hpf Final   Bacteria, UA 08/30/2020 NONE SEEN  NONE SEEN Final   Performed at [facility], [place] [address] [place], [place] [address]   Opiates 08/30/2020 NONE DETECTED  NONE DETECTED Final   Cocaine 08/30/2020 NONE DETECTED  NONE DETECTED Final   Benzodiazepines 08/30/2020 NONE DETECTED  NONE DETECTED Final   Amphetamines 08/30/2020 NONE DETECTED  NONE DETECTED Final   Tetrahydrocannabinol 08/30/2020 NONE DETECTED  NONE DETECTED Final   Barbiturates 08/30/2020 NONE DETECTED  NONE DETECTED Final   Comment: (NOTE) DRUG SCREEN FOR MEDICAL PURPOSES ONLY.  IF CONFIRMATION IS NEEDED FOR ANY PURPOSE, NOTIFY LAB WITHIN 5 DAYS.  LOWEST DETECTABLE LIMITS FOR URINE DRUG SCREEN Drug Class                     Cutoff (ng/mL) Amphetamine and metabolites    1000 Barbiturate and metabolites    200 Benzodiazepine                 166 Tricyclics and metabolites     300 Opiates and metabolites        300 Cocaine and metabolites        300 THC                            50 Performed at [facility], [place] [address] [place], [place] [address]     Allergies: Patient has no active allergies.  PTA Medications:  Current Outpatient Medications  Medication Instructions   albuterol (VENTOLIN HFA) 108 (90 Base) MCG/ACT inhaler 1-2 puffs, Inhalation, Every 6 hours PRN   ARIPiprazole (ABILIFY) 5 mg, Oral, Daily   aspirin EC 81 mg, Oral, Daily, Swallow whole.   clopidogrel (PLAVIX) 75 mg, Oral, Daily   diclofenac Sodium (VOLTAREN) 2 g, Topical, 4 times daily PRN   DULoxetine (CYMBALTA) 20 mg, Oral, Daily   empagliflozin (JARDIANCE) 12.5 mg, Oral, Daily   gabapentin (NEURONTIN) 200 mg, Oral, 3 times daily   lamoTRIgine (LAMICTAL) 25 mg, Oral, Daily    metformin (FORTAMET) 1,000 mg, Oral, Daily with supper   metFORMIN (GLUCOPHAGE-XR) 500 mg, Oral, Daily with breakfast   metoprolol succinate (TOPROL-XL) 25 mg, Oral, Daily   Multiple Vitamin (MULTIVITAMIN WITH MINERALS) TABS tablet 1 tablet, Oral, Daily PRN   nicotine polacrilex (NICORETTE) 2 mg, Oral, As needed   omeprazole (PRILOSEC) 20 mg, Oral, Daily before breakfast   pantoprazole (PROTONIX) 40 mg, Oral, Daily   polyethylene glycol powder (GLYCOLAX/MIRALAX) 17 g, Oral, Daily PRN   rosuvastatin (CRESTOR) 40 mg, Daily   traZODone (DESYREL) 50 mg, Oral, At bedtime PRN      Medical Decision Making  Patient has been recommended for inpatient treatment by Delrae Sawyers, NP  [facility]   Patient was medically cleared in the ED.  Continue home medications: Scheduled Meds:  [START ON 01/06/2021] ARIPiprazole  5 mg Oral Daily   [START ON 01/06/2021] aspirin EC  81 mg Oral Daily   [START ON 01/06/2021] clopidogrel  75 mg Oral Daily   [START ON 01/06/2021] DULoxetine  20 mg Oral Daily   [START ON 01/06/2021] gabapentin  200 mg Oral TID   [START ON 01/06/2021] lamoTRIgine  25 mg Oral Daily   [START ON 01/06/2021] metformin  1,000 mg Oral Q supper   [START  ON 01/06/2021] metFORMIN  500 mg Oral Q breakfast   [START ON 01/06/2021] metoprolol succinate  25 mg Oral Daily   [START ON 01/06/2021] pantoprazole  40 mg Oral Daily   [START ON 01/06/2021] rosuvastatin  40 mg Oral Daily   PRN Meds:.acetaminophen, albuterol, alum & mag hydroxide-simeth, magnesium hydroxide, traZODone       Recommendations  Based on my evaluation the patient does not appear to have an emergency medical condition.  Rozetta Nunnery, NP 01/05/21  11:14 PM

## 2021-01-05 NOTE — ED Notes (Signed)
Breakfast orders placed 

## 2021-01-05 NOTE — ED Notes (Signed)
Report given to [place], Therapist, sports.  All questions answered.

## 2021-01-05 NOTE — Progress Notes (Signed)
CSW followed up with [facility] Transfer Coordinator April at [facility]T [id number]. Per April, [place] is currently on mental health capacity. April states that [place], [place], [place], [place], and [place] are all on [facility] diversion. CSW was unable to get in contact with the [facility]. Patient is transferring to the [facility] for observation.    [name], MSW, LCSW-A  3:26 PM 01/05/2021

## 2021-01-05 NOTE — ED Notes (Signed)
Patient's two belonging bag returned and given to [employer].  Belongings to be transported with patient.

## 2021-01-05 NOTE — Consult Note (Signed)
Telepsych Consultation   Reason for Consult:  Psychiatric Reassessment for SI Referring Physician:  Dr. Pattricia Boss Location of Patient:    [facility] Location of Provider: Other: Virtual home office  Patient Identification: [name] MRN:  [id number] Principal Diagnosis: Severe episode of recurrent major depressive disorder, without psychotic features ([place]) Diagnosis:  Principal Problem:   Severe episode of recurrent major depressive disorder, without psychotic features ([place]) Active Problems:   Bipolar disorder ([place])   Cocaine abuse ([name])   Alcohol abuse   Total Time spent with patient: 30 minutes  Subjective:   [name] is a 65 y.o. male patient admitted with suicidal ideations with a plan to shoot himself with a firearm. His urine drug screen was positive for cocaine.  Today he states "I'm still going to hurt myself if you send me home."  Patient seen via telepsych by this provider; chart reviewed and consulted with Dr. Dwyane Dee on 01/05/21.  On evaluation [name] reports  he's depressed and cannot find a reason to live.  Endorses hopelessness, despair and "recreational drug use"  His UDS is positive for cocaine, however patient states this does not alter his mood or cause exacerbation of his mental illness.  He's known to our facility for similar presentation, [facility] inpatient with [facility] was 4 years ago.  He was restarted on his home psychotropic medications, tolerated well no side effects.  Sleep and appetite are good. He's pretty consistent with his SI with plan, cannot contract for safety today.    HPI:  Per MD Admission Assessment 01/04/2021: Chief Complaint  Patient presents with   Suicidal      [name] is a 65 y.o. male.   HPI   65 year old male presents today complaining of suicidal stating that he would like to shoot himself in the head Review of psychiatry discharge on 11 1 reveals patient was admitted with major depressive disorder.  He  was discharged with "good mood".  He was to follow-up at [facility].  He was discharged on aripiprazole 5 mg daily, duloxetine 20 mg daily, gabapentin, lamotrigine and trazodone.  Past Psychiatric History: as outlined below  Risk to Self:  yes Risk to Others:  no Prior Inpatient Therapy: yes  Prior Outpatient Therapy:  yes  Past Medical History:  Past Medical History:  Diagnosis Date   Anxiety    Arthritis    Cancer ([place])    Cataract    Depression    Diabetes mellitus    GERD (gastroesophageal reflux disease)    H/O suicide attempt 1997 and 2011   jumped out of moving vehicle and attempted to jump into moving traffic   High cholesterol    Hypertension    Neuropathic pain    Osteoporosis     Past Surgical History:  Procedure Laterality Date   COLONOSCOPY     CORONARY STENT INTERVENTION N/A 10/10/2020   Procedure: CORONARY STENT INTERVENTION;  Surgeon: Jettie Booze, MD;  Location: [facility];  Service: Cardiovascular;  Laterality: N/A;   LEFT HEART CATH AND CORONARY ANGIOGRAPHY N/A 10/10/2020   Procedure: LEFT HEART CATH AND CORONARY ANGIOGRAPHY;  Surgeon: Jettie Booze, MD;  Location: [facility];  Service: Cardiovascular;  Laterality: N/A;   NEPHRECTOMY     due to cancer   RIGHT/LEFT HEART CATH AND CORONARY ANGIOGRAPHY N/A 10/09/2020   Procedure: RIGHT/LEFT HEART CATH AND CORONARY ANGIOGRAPHY;  Surgeon: Nelva Bush, MD;  Location: [facility];  Service: Cardiovascular;  Laterality: N/A;   Family History:  Family History  Problem Relation Age of Onset   Alcohol abuse Sister    Alcohol abuse Brother    Colon cancer Neg Hx    Esophageal cancer Neg Hx    Pancreatic cancer Neg Hx    Rectal cancer Neg Hx    Stomach cancer Neg Hx    Family Psychiatric  History: unknown Social History:  Social History   Substance and Sexual Activity  Alcohol Use Yes   Alcohol/week: 3.0 standard drinks   Types: 3 Cans of beer per week    Comment: Pt has been clean for 8 months     Social History   Substance and Sexual Activity  Drug Use Yes   Types: "Crack" cocaine, Cocaine    Social History   Socioeconomic History   Marital status: Single    Spouse name: Not on file   Number of children: Not on file   Years of education: Not on file   Highest education level: Not on file  Occupational History   Not on file  Tobacco Use   Smoking status: Every Day    Packs/day: 0.50    Years: 20.00    Pack years: 10.00    Types: Cigarettes   Smokeless tobacco: Never  Vaping Use   Vaping Use: Never used  Substance and Sexual Activity   Alcohol use: Yes    Alcohol/week: 3.0 standard drinks    Types: 3 Cans of beer per week    Comment: Pt has been clean for 8 months   Drug use: Yes    Types: "Crack" cocaine, Cocaine   Sexual activity: Yes    Birth control/protection: Condom  Other Topics Concern   Not on file  Social History Narrative   Lives alone.  Has sister in area.   Social Determinants of Health   Financial Resource Strain: Not on file  Food Insecurity: Not on file  Transportation Needs: Not on file  Physical Activity: Not on file  Stress: Not on file  Social Connections: Not on file   Additional Social History:    Allergies:   Allergies  Allergen Reactions   Morphine And Related Other (See Comments)    Pt unable to take narcotics.     Labs:  Results for orders placed or performed during the hospital encounter of 01/04/21 (from the past 48 hour(s))  Rapid urine drug screen (hospital performed)     Status: Abnormal   Collection Time: 01/04/21  7:46 AM  Result Value Ref Range   Opiates NONE DETECTED NONE DETECTED   Cocaine POSITIVE (A) NONE DETECTED   Benzodiazepines NONE DETECTED NONE DETECTED   Amphetamines NONE DETECTED NONE DETECTED   Tetrahydrocannabinol POSITIVE (A) NONE DETECTED   Barbiturates NONE DETECTED NONE DETECTED    Comment: (NOTE) DRUG SCREEN FOR MEDICAL PURPOSES ONLY.  IF  CONFIRMATION IS NEEDED FOR ANY PURPOSE, NOTIFY LAB WITHIN 5 DAYS.  LOWEST DETECTABLE LIMITS FOR URINE DRUG SCREEN Drug Class                     Cutoff (ng/mL) Amphetamine and metabolites    1000 Barbiturate and metabolites    200 Benzodiazepine                 350 Tricyclics and metabolites     300 Opiates and metabolites        300 Cocaine and metabolites        300 THC  50 Performed at [facility], [place] [address], [place], [place] [address]   Comprehensive metabolic panel     Status: Abnormal   Collection Time: 01/04/21  7:48 AM  Result Value Ref Range   Sodium 134 (L) 135 - 145 mmol/L   Potassium 3.7 3.5 - 5.1 mmol/L   Chloride 104 98 - 111 mmol/L   CO2 25 22 - 32 mmol/L   Glucose, Bld 145 (H) 70 - 99 mg/dL    Comment: Glucose reference range applies only to samples taken after fasting for at least 8 hours.   BUN 13 8 - 23 mg/dL   Creatinine, Ser 0.95 0.61 - 1.24 mg/dL   Calcium 8.9 8.9 - 10.3 mg/dL   Total Protein 6.7 6.5 - 8.1 g/dL   Albumin 3.7 3.5 - 5.0 g/dL   AST 25 15 - 41 U/L   ALT 23 0 - 44 U/L   Alkaline Phosphatase 64 38 - 126 U/L   Total Bilirubin 0.5 0.3 - 1.2 mg/dL   GFR, Estimated >60 >60 mL/min    Comment: (NOTE) Calculated using the CKD-EPI Creatinine Equation (2021)    Anion gap 5 5 - 15    Comment: Performed at [facility] [address] [place], [place] [address]  Ethanol     Status: None   Collection Time: 01/04/21  7:48 AM  Result Value Ref Range   Alcohol, Ethyl (B) <10 <10 mg/dL    Comment: (NOTE) Lowest detectable limit for serum alcohol is 10 mg/dL.  For medical purposes only. Performed at [facility], [place] [address], [place], [place] [address]   Salicylate level     Status: Abnormal   Collection Time: 01/04/21  7:48 AM  Result Value Ref Range   Salicylate Lvl <4.7 (L) 7.0 - 30.0 mg/dL    Comment: Performed at [facility] [address] [place], [place] [address]   Acetaminophen level     Status: Abnormal   Collection Time: 01/04/21  7:48 AM  Result Value Ref Range   Acetaminophen (Tylenol), Serum <10 (L) 10 - 30 ug/mL    Comment: (NOTE) Therapeutic concentrations vary significantly. A range of 10-30 ug/mL  may be an effective concentration for many patients. However, some  are best treated at concentrations outside of this range. Acetaminophen concentrations >150 ug/mL at 4 hours after ingestion  and >50 ug/mL at 12 hours after ingestion are often associated with  toxic reactions.  Performed at [facility], [place] [address], [place], [place] [address]   cbc     Status: None   Collection Time: 01/04/21  7:48 AM  Result Value Ref Range   WBC 4.6 4.0 - 10.5 K/uL   RBC 4.90 4.22 - 5.81 MIL/uL   Hemoglobin 14.0 13.0 - 17.0 g/dL   HCT 42.3 39.0 - 52.0 %   MCV 86.3 80.0 - 100.0 fL   MCH 28.6 26.0 - 34.0 pg   MCHC 33.1 30.0 - 36.0 g/dL   RDW 14.4 11.5 - 15.5 %   Platelets 204 150 - 400 K/uL   nRBC 0.0 0.0 - 0.2 %    Comment: Performed at [facility], [place] [address] [place], [place] [address]  Resp Panel by RT-PCR (Flu A&B, Covid) Nasopharyngeal Swab     Status: None   Collection Time: 01/04/21 12:33 PM   Specimen: Nasopharyngeal Swab; Nasopharyngeal(NP) swabs in vial transport medium  Result Value Ref Range  SARS Coronavirus 2 by RT PCR NEGATIVE NEGATIVE    Comment: (NOTE) SARS-CoV-2 target nucleic acids are NOT DETECTED.  The SARS-CoV-2 RNA is generally detectable in upper respiratory specimens during the acute phase of infection. The lowest concentration of SARS-CoV-2 viral copies this assay can detect is 138 copies/mL. A negative result does not preclude SARS-Cov-2 infection and should not be used as the sole basis for treatment or other patient management decisions. A negative result may occur with  improper specimen collection/handling, submission of specimen other than nasopharyngeal swab, presence of viral mutation(s)  within the areas targeted by this assay, and inadequate number of viral copies(<138 copies/mL). A negative result must be combined with clinical observations, patient history, and epidemiological information. The expected result is Negative.  Fact Sheet for Patients:  EntrepreneurPulse.com.au  Fact Sheet for Healthcare Providers:  IncredibleEmployment.be  This test is no t yet approved or cleared by the [place] FDA and  has been authorized for detection and/or diagnosis of SARS-CoV-2 by FDA under an Emergency Use Authorization (EUA). This EUA will remain  in effect (meaning this test can be used) for the duration of the COVID-19 declaration under Section 564(b)(1) of the Act, 21 U.S.C.section 360bbb-3(b)(1), unless the authorization is terminated  or revoked sooner.       Influenza A by PCR NEGATIVE NEGATIVE   Influenza B by PCR NEGATIVE NEGATIVE    Comment: (NOTE) The Xpert Xpress SARS-CoV-2/FLU/RSV plus assay is intended as an aid in the diagnosis of influenza from Nasopharyngeal swab specimens and should not be used as a sole basis for treatment. Nasal washings and aspirates are unacceptable for Xpert Xpress SARS-CoV-2/FLU/RSV testing.  Fact Sheet for Patients: EntrepreneurPulse.com.au  Fact Sheet for Healthcare Providers: IncredibleEmployment.be  This test is not yet approved or cleared by the [place] FDA and has been authorized for detection and/or diagnosis of SARS-CoV-2 by FDA under an Emergency Use Authorization (EUA). This EUA will remain in effect (meaning this test can be used) for the duration of the COVID-19 declaration under Section 564(b)(1) of the Act, 21 U.S.C. section 360bbb-3(b)(1), unless the authorization is terminated or revoked.  Performed at [facility], [place] [address], [place], [place] [address]   CBG monitoring, ED     Status: Abnormal   Collection Time:  01/04/21  9:22 PM  Result Value Ref Range   Glucose-Capillary 150 (H) 70 - 99 mg/dL    Comment: Glucose reference range applies only to samples taken after fasting for at least 8 hours.    Medications:  Current Facility-Administered Medications  Medication Dose Route Frequency Provider Last Rate Last Admin   albuterol (VENTOLIN HFA) 108 (90 Base) MCG/ACT inhaler 1-2 puff  1-2 puff Inhalation Q6H PRN Pattricia Boss, MD       ARIPiprazole (ABILIFY) tablet 5 mg  5 mg Oral Daily Pattricia Boss, MD   5 mg at 01/05/21 4010   aspirin EC tablet 81 mg  81 mg Oral Daily Pattricia Boss, MD   81 mg at 01/05/21 0951   clopidogrel (PLAVIX) tablet 75 mg  75 mg Oral Daily Pattricia Boss, MD   75 mg at 01/05/21 0951   DULoxetine (CYMBALTA) DR capsule 20 mg  20 mg Oral Daily Pattricia Boss, MD   20 mg at 01/05/21 0951   gabapentin (NEURONTIN) capsule 200 mg  200 mg Oral TID Pattricia Boss, MD   200 mg at 01/05/21 0951   lamoTRIgine (LAMICTAL) tablet 25 mg  25 mg Oral Daily Pattricia Boss,  MD   25 mg at 01/05/21 0951   metFORMIN (GLUCOPHAGE-XR) 24 hr tablet 1,000 mg  1,000 mg Oral Q supper Pattricia Boss, MD   1,000 mg at 01/04/21 1607   metoprolol succinate (TOPROL-XL) 24 hr tablet 25 mg  25 mg Oral Daily Pattricia Boss, MD   25 mg at 01/05/21 7673   pantoprazole (PROTONIX) EC tablet 40 mg  40 mg Oral Daily Pattricia Boss, MD   40 mg at 01/05/21 4193   rosuvastatin (CRESTOR) tablet 40 mg  40 mg Oral Daily Pattricia Boss, MD   40 mg at 01/05/21 0953   traZODone (DESYREL) tablet 50 mg  50 mg Oral QHS PRN Pattricia Boss, MD       Current Outpatient Medications  Medication Sig Dispense Refill   albuterol (VENTOLIN HFA) 108 (90 Base) MCG/ACT inhaler Inhale 1-2 puffs into the lungs every 6 (six) hours as needed for wheezing or shortness of breath. 1 each 0   ARIPiprazole (ABILIFY) 10 MG tablet Take 5 mg by mouth daily.     aspirin EC 81 MG tablet Take 81 mg by mouth daily. Swallow whole.     clopidogrel (PLAVIX) 75 MG tablet  Take 1 tablet (75 mg total) by mouth daily. 30 tablet 0   DULoxetine (CYMBALTA) 20 MG capsule Take 1 capsule (20 mg total) by mouth daily. 30 capsule 0   gabapentin (NEURONTIN) 100 MG capsule Take 2 capsules (200 mg total) by mouth 3 (three) times daily. 180 capsule 0   lamoTRIgine (LAMICTAL) 25 MG tablet Take 1 tablet (25 mg total) by mouth daily. 30 tablet 0   metFORMIN (FORTAMET) 1000 MG (OSM) 24 hr tablet Take 1 tablet (1,000 mg total) by mouth daily with supper. 30 tablet 0   metFORMIN (GLUCOPHAGE-XR) 500 MG 24 hr tablet Take 1 tablet (500 mg total) by mouth daily with breakfast. 30 tablet 0   metoprolol succinate (TOPROL-XL) 25 MG 24 hr tablet Take 1 tablet (25 mg total) by mouth daily. 30 tablet 0   Multiple Vitamin (MULTIVITAMIN WITH MINERALS) TABS tablet Take 1 tablet by mouth daily.     nicotine polacrilex (NICORETTE) 2 MG gum Take 1 each (2 mg total) by mouth as needed for smoking cessation. 100 tablet 0   pantoprazole (PROTONIX) 40 MG tablet Take 1 tablet (40 mg total) by mouth daily. 30 tablet 1   rosuvastatin (CRESTOR) 40 MG tablet Take 40 mg by mouth daily.     traZODone (DESYREL) 50 MG tablet Take 50 mg by mouth at bedtime as needed for sleep.      Musculoskeletal: Strength & Muscle Tone: within normal limits Gait & Station:  slow but steady gait, cites chronic pain Patient leans: N/A      Psychiatric Specialty Exam:  Presentation  General Appearance: Fairly Groomed; Appropriate for Environment  Eye Contact:Good  Speech:Clear and Coherent; Normal Rate  Speech Volume:Normal  Handedness:Right   Mood and Affect  Mood:Depressed; Dysphoric  Affect:Congruent; Depressed   Thought Process  Thought Processes:Coherent  Descriptions of Associations:Intact  Orientation:Full (Time, Place and Person)  Thought Content:Illogical  History of Schizophrenia/Schizoaffective disorder:No  Duration of Psychotic Symptoms:N/A  Hallucinations:Hallucinations:  None  Ideas of Reference:None  Suicidal Thoughts:SI Active Intent and/or Plan: With Intent; With Plan; With Access to Means  Homicidal Thoughts:Homicidal Thoughts: No   Sensorium  Memory:Immediate Good; Recent Good; Remote Good  Judgment:Impaired  Insight:Lacking   Executive Functions  Concentration:Good  Attention Span:Good  [place]   Psychomotor  Activity  Psychomotor Activity:Psychomotor Activity: Normal   Assets  Assets:Communication Skills   Sleep  Sleep:Sleep: Good Number of Hours of Sleep: 8    Physical Exam: Physical Exam Constitutional:      Appearance: Normal appearance.  Musculoskeletal:     Cervical back: Normal range of motion.     Comments: C/o chronic lower extremity pain  Neurological:     General: No focal deficit present.     Mental Status: He is alert and oriented to person, place, and time.  Psychiatric:        Attention and Perception: Attention normal.        Mood and Affect: Mood is anxious and depressed.        Speech: Speech normal.        Behavior: Behavior is cooperative.        Thought Content: Thought content includes suicidal ideation. Thought content includes suicidal plan.        Cognition and Memory: Cognition and memory normal.        Judgment: Judgment is impulsive and inappropriate.   ROS Blood pressure 126/61, pulse 70, temperature 97.8 F (36.6 C), temperature source Oral, resp. rate 16, SpO2 100 %. There is no height or weight on file to calculate BMI.  Treatment Plan Summary: Patient with MDD and polysubstance abuse, currently with active SI with plan to shoot himself if released today.  Sales promotion account executive for safety. Recommend inpatient psych at this time.  Since he is a veteran we will seek placement at the [place] first.  However if no beds available recommend [facility] review for acceptance.  His psych meds have already been restarted.  Covid and flu are both negative.    Disposition: Recommend psychiatric Inpatient admission when medically cleared.  This service was provided via telemedicine using a 2-way, interactive audio and video technology.  Names of all persons participating in this telemedicine service and their role in this encounter. Name: [name]  Role: Patient  Name: Merlyn Lot Role: [place], NP 01/05/2021 11:24 AM

## 2021-01-05 NOTE — ED Notes (Signed)
Patient requesting "Miralax" after returning from restroom.  States "I know there was a stool softener, but I need something else."

## 2021-01-05 NOTE — ED Notes (Signed)
All paperwork and IVC papers given to [employer].  Patient transported to [facility] at this time

## 2021-01-05 NOTE — ED Notes (Signed)
Patient woke from sleep and sat straight up in bed.  Reports "I had a bad dream."  Patient calm and cooperative.  No complaints voiced at this time.  Ambulatory to bathroom

## 2021-01-06 ENCOUNTER — Inpatient Hospital Stay (HOSPITAL_COMMUNITY)
Admission: AD | Admit: 2021-01-06 | Discharge: 2021-01-12 | DRG: 885 | Disposition: A | Discharge status: Home / Self Care | Payer: Medicaid Other | Source: Intra-hospital | Attending: Emergency Medicine | Admitting: Emergency Medicine

## 2021-01-06 ENCOUNTER — Other Ambulatory Visit: Payer: Self-pay

## 2021-01-06 ENCOUNTER — Encounter (HOSPITAL_COMMUNITY): Payer: Self-pay | Admitting: Registered Nurse

## 2021-01-06 ENCOUNTER — Other Ambulatory Visit: Payer: Self-pay | Admitting: Registered Nurse

## 2021-01-06 ENCOUNTER — Encounter (HOSPITAL_COMMUNITY): Payer: Self-pay | Admitting: Emergency Medicine

## 2021-01-06 ENCOUNTER — Telehealth (HOSPITAL_COMMUNITY): Payer: Self-pay

## 2021-01-06 DIAGNOSIS — Z91199 Patient's noncompliance with other medical treatment and regimen due to unspecified reason: Secondary | ICD-10-CM | POA: Diagnosis not present

## 2021-01-06 DIAGNOSIS — E119 Type 2 diabetes mellitus without complications: Secondary | ICD-10-CM | POA: Diagnosis present

## 2021-01-06 DIAGNOSIS — F1420 Cocaine dependence, uncomplicated: Secondary | ICD-10-CM | POA: Diagnosis not present

## 2021-01-06 DIAGNOSIS — K30 Functional dyspepsia: Secondary | ICD-10-CM | POA: Diagnosis present

## 2021-01-06 DIAGNOSIS — R45851 Suicidal ideations: Secondary | ICD-10-CM | POA: Diagnosis not present

## 2021-01-06 DIAGNOSIS — G4700 Insomnia, unspecified: Secondary | ICD-10-CM | POA: Diagnosis present

## 2021-01-06 DIAGNOSIS — F419 Anxiety disorder, unspecified: Secondary | ICD-10-CM | POA: Diagnosis present

## 2021-01-06 DIAGNOSIS — I10 Essential (primary) hypertension: Secondary | ICD-10-CM | POA: Diagnosis present

## 2021-01-06 DIAGNOSIS — I2510 Atherosclerotic heart disease of native coronary artery without angina pectoris: Secondary | ICD-10-CM | POA: Diagnosis present

## 2021-01-06 DIAGNOSIS — E7800 Pure hypercholesterolemia, unspecified: Secondary | ICD-10-CM | POA: Diagnosis present

## 2021-01-06 DIAGNOSIS — F314 Bipolar disorder, current episode depressed, severe, without psychotic features: Secondary | ICD-10-CM | POA: Diagnosis not present

## 2021-01-06 DIAGNOSIS — Z7984 Long term (current) use of oral hypoglycemic drugs: Secondary | ICD-10-CM

## 2021-01-06 DIAGNOSIS — F4310 Post-traumatic stress disorder, unspecified: Secondary | ICD-10-CM | POA: Diagnosis not present

## 2021-01-06 DIAGNOSIS — E785 Hyperlipidemia, unspecified: Secondary | ICD-10-CM | POA: Diagnosis present

## 2021-01-06 DIAGNOSIS — F17210 Nicotine dependence, cigarettes, uncomplicated: Secondary | ICD-10-CM | POA: Diagnosis present

## 2021-01-06 DIAGNOSIS — F1410 Cocaine abuse, uncomplicated: Secondary | ICD-10-CM | POA: Diagnosis present

## 2021-01-06 DIAGNOSIS — K219 Gastro-esophageal reflux disease without esophagitis: Secondary | ICD-10-CM | POA: Diagnosis present

## 2021-01-06 DIAGNOSIS — F1994 Other psychoactive substance use, unspecified with psychoactive substance-induced mood disorder: Secondary | ICD-10-CM | POA: Diagnosis not present

## 2021-01-06 DIAGNOSIS — Z79899 Other long term (current) drug therapy: Secondary | ICD-10-CM

## 2021-01-06 DIAGNOSIS — Z7982 Long term (current) use of aspirin: Secondary | ICD-10-CM | POA: Diagnosis not present

## 2021-01-06 DIAGNOSIS — F1220 Cannabis dependence, uncomplicated: Secondary | ICD-10-CM | POA: Diagnosis present

## 2021-01-06 HISTORY — DX: Cocaine dependence, uncomplicated: F14.20

## 2021-01-06 LAB — GLUCOSE, CAPILLARY: Glucose-Capillary: 123 mg/dL — ABNORMAL HIGH (ref 70–99)

## 2021-01-06 LAB — POC SARS CORONAVIRUS 2 AG: SARSCOV2ONAVIRUS 2 AG: NEGATIVE

## 2021-01-06 MED ORDER — HYDROXYZINE HCL 25 MG PO TABS
25.0000 mg | ORAL_TABLET | Freq: Three times a day (TID) | ORAL | Status: DC | PRN
  Administered 2021-01-06 – 2021-01-11 (×5): 25 mg via ORAL
  Filled 2021-01-06 (×6): qty 1

## 2021-01-06 MED ORDER — TRAZODONE HCL 50 MG PO TABS
50.0000 mg | ORAL_TABLET | Freq: Every evening | ORAL | Status: DC | PRN
  Administered 2021-01-06 – 2021-01-11 (×5): 50 mg via ORAL
  Filled 2021-01-06 (×6): qty 1

## 2021-01-06 MED ORDER — ACETAMINOPHEN 325 MG PO TABS: 650.0000 mg | ORAL_TABLET | Freq: Four times a day (QID) | ORAL | Status: DC | PRN

## 2021-01-06 MED ORDER — ARIPIPRAZOLE 5 MG PO TABS
5.0000 mg | ORAL_TABLET | Freq: Every day | ORAL | Status: DC
  Administered 2021-01-07: 5 mg via ORAL
  Filled 2021-01-06 (×3): qty 1

## 2021-01-06 MED ORDER — ALUM & MAG HYDROXIDE-SIMETH 200-200-20 MG/5ML PO SUSP: 30.0000 mL | ORAL | Status: DC | PRN

## 2021-01-06 MED ORDER — ADULT MULTIVITAMIN W/MINERALS CH: 1.0000 | ORAL_TABLET | Freq: Every day | ORAL | Status: DC | PRN

## 2021-01-06 MED ORDER — LAMOTRIGINE 25 MG PO TABS
25.0000 mg | ORAL_TABLET | Freq: Every day | ORAL | Status: DC
  Administered 2021-01-07: 25 mg via ORAL
  Filled 2021-01-06 (×3): qty 1

## 2021-01-06 MED ORDER — NICOTINE POLACRILEX 2 MG MT GUM
2.0000 mg | CHEWING_GUM | OROMUCOSAL | Status: DC | PRN
  Administered 2021-01-10 – 2021-01-12 (×4): 2 mg via ORAL
  Filled 2021-01-06: qty 1

## 2021-01-06 MED ORDER — MAGNESIUM HYDROXIDE 400 MG/5ML PO SUSP: 30.0000 mL | Freq: Every day | ORAL | Status: DC | PRN

## 2021-01-06 MED ORDER — ASPIRIN EC 81 MG PO TBEC
81.0000 mg | DELAYED_RELEASE_TABLET | Freq: Every day | ORAL | Status: DC
  Administered 2021-01-07 – 2021-01-12 (×6): 81 mg via ORAL
  Filled 2021-01-06 (×8): qty 1

## 2021-01-06 MED ORDER — DULOXETINE HCL 20 MG PO CPEP
20.0000 mg | ORAL_CAPSULE | Freq: Every day | ORAL | Status: DC
  Administered 2021-01-07: 20 mg via ORAL
  Filled 2021-01-06 (×3): qty 1

## 2021-01-06 MED ORDER — METFORMIN HCL ER 500 MG PO TB24
1000.0000 mg | ORAL_TABLET | Freq: Every day | ORAL | Status: DC
  Administered 2021-01-07 – 2021-01-11 (×5): 1000 mg via ORAL
  Filled 2021-01-06 (×7): qty 2

## 2021-01-06 MED ORDER — CLOPIDOGREL BISULFATE 75 MG PO TABS
75.0000 mg | ORAL_TABLET | Freq: Every day | ORAL | Status: DC
  Administered 2021-01-07 – 2021-01-12 (×6): 75 mg via ORAL
  Filled 2021-01-06 (×8): qty 1

## 2021-01-06 MED ORDER — ROSUVASTATIN CALCIUM 40 MG PO TABS
40.0000 mg | ORAL_TABLET | Freq: Every day | ORAL | Status: DC
  Administered 2021-01-07 – 2021-01-12 (×6): 40 mg via ORAL
  Filled 2021-01-06 (×8): qty 1

## 2021-01-06 MED ORDER — GABAPENTIN 100 MG PO CAPS
200.0000 mg | ORAL_CAPSULE | Freq: Three times a day (TID) | ORAL | Status: DC
  Administered 2021-01-06 – 2021-01-07 (×3): 200 mg via ORAL
  Filled 2021-01-06 (×9): qty 2

## 2021-01-06 MED ORDER — METFORMIN HCL ER 500 MG PO TB24
500.0000 mg | ORAL_TABLET | Freq: Every day | ORAL | Status: DC
  Administered 2021-01-07 – 2021-01-12 (×6): 500 mg via ORAL
  Filled 2021-01-06 (×8): qty 1

## 2021-01-06 MED ORDER — ALBUTEROL SULFATE HFA 108 (90 BASE) MCG/ACT IN AERS: 1.0000 | INHALATION_SPRAY | Freq: Four times a day (QID) | RESPIRATORY_TRACT | Status: DC | PRN

## 2021-01-06 MED ORDER — METOPROLOL SUCCINATE ER 25 MG PO TB24
25.0000 mg | ORAL_TABLET | Freq: Every day | ORAL | Status: DC
  Administered 2021-01-07 – 2021-01-12 (×6): 25 mg via ORAL
  Filled 2021-01-06 (×8): qty 1

## 2021-01-06 MED ORDER — PANTOPRAZOLE SODIUM 40 MG PO TBEC
40.0000 mg | DELAYED_RELEASE_TABLET | Freq: Every day | ORAL | Status: DC
  Administered 2021-01-07 – 2021-01-12 (×6): 40 mg via ORAL
  Filled 2021-01-06 (×8): qty 1

## 2021-01-06 NOTE — Progress Notes (Signed)
Pt accepted to [facility]     Patient meets inpatient criteria per Merlyn Lot, NP   The attending provider will be Viann Fish, MD   Call report to [phone]    Florina Ou, RN @ [facility] notified.     Pt scheduled  to arrive at [place]. Patient's bed is currently available but pending negative COVID test results. AC will follow-up.    [name], MSW, LCSW-A  2:08 PM 01/06/2021

## 2021-01-06 NOTE — ED Notes (Signed)
Report called to [facility] and given to [place], [place]. [facility] called.

## 2021-01-06 NOTE — ED Notes (Signed)
Patient asleep in bed this morning without distress or complaint.  Will monitor and provide safe environment
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
## 2021-01-06 NOTE — Discharge Instructions (Addendum)
Please begin to call and apply for the following residential substance use treatment programs:   [facility]  [address], [place], [place] [address] [phone]  [facility]  [address], [place] [address] [place], [place] [address] [phone]  [facility] ([place])  [address], [place], [place] [address] [phone]  [facility] ([place]) [address] [place], [place] [address] [phone]  [facility] [place], [place], [place] [address] [phone]  [facility]  [place], [place], [place] [address] [phone]

## 2021-01-06 NOTE — ED Notes (Signed)
Pt has left unit to be taken to [facility] via safe transport.  Safety maintained

## 2021-01-06 NOTE — [facility] Group Notes (Signed)
[facility] Group Notes:  (Nursing/MHT/Case Management/Adjunct)  Date:  01/06/2021  Time:  8:42 PM  Type of Therapy:  Psychoeducational Skills  Participation Level:  Minimal  Participation Quality:  Attentive  Affect:  Appropriate  Cognitive:  Appropriate  Insight:  Appropriate  Engagement in Group:  Engaged  Modes of Intervention:  Education  Summary of Progress/Problems: The patient rated his day as a 5 out of 10 since his day was initially confusing while the staff determined where they would send him. His goal for tomorrow is to think positive and be "more assertive".  Archie Balboa S 01/06/2021, 8:42 PM

## 2021-01-06 NOTE — Tx Team (Signed)
Initial Treatment Plan 01/06/2021 5:55 PM [name] QWQ:[id number]    PATIENT STRESSORS: SI   PATIENT STRENGTHS: Ability for insight  Active sense of humor  Physical Health    PATIENT IDENTIFIED PROBLEMS: SI                     DISCHARGE CRITERIA:  Ability to meet basic life and health needs  PRELIMINARY DISCHARGE PLAN: Attend aftercare/continuing care group  PATIENT/FAMILY INVOLVEMENT: This treatment plan has been presented to and reviewed with the patient, [name]. The patient has given the opportunity to ask questions and make suggestions.  Roxanna Mew, RN 01/06/2021, 5:55 PM

## 2021-01-06 NOTE — ED Notes (Signed)
Pt is in the bed sleeping. Respirations are even and unlabored. No acute distress noted. Will continue to monitor for safety. 
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
## 2021-01-06 NOTE — BH Assessment (Signed)
Care Management - Follow Up [facility] Discharges   Patient has been placed in an inpatient psychiatric hospital ([facility]) on 01-06-21.

## 2021-01-06 NOTE — ED Provider Notes (Signed)
Behavioral Health Progress Note  Date and Time: 01/06/2021 10:32 AM Name: [name] MRN:  [id number]  Subjective:  "If I say I ain't suicidal ya'll will just kick me out and not help me find rehab.  I need help I got a drug problem.  If I can't get help ain't no use continuing."   [name] is a 65 year old male with a past medical history of major depressive disorder, chronic arthritis of both knees, diabetes, hypertension, hyperlipidemia, CHF, renal cell carcinoma, and coronary artery disease status/post placement of two stents in August 2022.  Patient admitted to [facility] continuous assessment unit sent from [facility] ED where he initially presented on 01/04/21 patient with complaints of suicidal ideation and plan to shoot himself in head at that time he was recommended overnight observation for safety and stabilization and restarting home medications.  Psychiatric reassessment 01/05/21 by Merlyn Lot, NP at which patient continued to endorse suicidal ideation related to his drug use and homelessness cause despair and hopelessness.  Patient then recommended to [facility] continuous assessment unit related to being well know to facility for similar presentations and patient unable to contract for safety.    [name] seen face to face for psychiatric reassessment by this provider, consulted with Dr. Ernie Hew; and chart reviewed on 01/06/21.  On evaluation [name] reports he continues to feel depressed and because of his drug use and unable to get into a rehab program.  Patient states his suicidal ideation is more related to wanting to get help to get off of drugs. "If I say I ain't suicidal ya'll will just kick me out and not help me find rehab."  Stating if he is unable to get treatment for drug use there is no need to  continue.   During evaluation [name] is sitting upright in chair in no acute distress.  He is alert, oriented x 4, calm, cooperative and attentive.   His mood is anxious and dysphoric with congruent affect.  He has normal speech, and behavior.  Objectively there is no evidence of psychosis/mania or delusional thinking.  Patient is able to converse coherently, goal directed thoughts, no distractibility, or pre-occupation.  He also denies homicidal ideation, psychosis, and paranoia; However he continues to endorse passive suicidal ideation that is conditional of receiving treatment for his substance use disorder and unable to get treatment doesn't feel there is a need to continue living.  Patient answered question appropriately.      Diagnosis:  Final diagnoses:  Bipolar affective disorder, depressed, severe ([place])  Cocaine use disorder, severe, dependence ([place])  Substance induced mood disorder ([place])  Post traumatic stress disorder (PTSD)  Cannabis use disorder, severe, in controlled environment, dependence ([name])    Total Time spent with patient: 30 minutes  Past Psychiatric History: See below Past Medical History:  Past Medical History:  Diagnosis Date   Anxiety    Arthritis    Cancer ([place])    Cataract    Depression    Diabetes mellitus    GERD (gastroesophageal reflux disease)    H/O suicide attempt 1997 and 2011   jumped out of moving vehicle and attempted to jump into moving traffic   High cholesterol    Hypertension    Neuropathic pain    Osteoporosis     Past Surgical History:  Procedure Laterality Date   COLONOSCOPY     CORONARY STENT INTERVENTION N/A 10/10/2020   Procedure: CORONARY STENT INTERVENTION;  Surgeon: Jettie Booze, MD;  Location: [place] [facility];  Service: Cardiovascular;  Laterality: N/A;   LEFT HEART CATH AND CORONARY ANGIOGRAPHY N/A 10/10/2020   Procedure: LEFT HEART CATH AND CORONARY ANGIOGRAPHY;  Surgeon: Jettie Booze, MD;  Location: [place] [facility];  Service: Cardiovascular;  Laterality: N/A;   NEPHRECTOMY     due to cancer   RIGHT/LEFT HEART CATH AND CORONARY ANGIOGRAPHY N/A  10/09/2020   Procedure: RIGHT/LEFT HEART CATH AND CORONARY ANGIOGRAPHY;  Surgeon: Nelva Bush, MD;  Location: [place] [facility];  Service: Cardiovascular;  Laterality: N/A;   Family History:  Family History  Problem Relation Age of Onset   Alcohol abuse Sister    Alcohol abuse Brother    Colon cancer Neg Hx    Esophageal cancer Neg Hx    Pancreatic cancer Neg Hx    Rectal cancer Neg Hx    Stomach cancer Neg Hx    Family Psychiatric  History: see above Social History:  Social History   Substance and Sexual Activity  Alcohol Use Yes   Alcohol/week: 3.0 standard drinks   Types: 3 Cans of beer per week   Comment: Pt has been clean for 8 months     Social History   Substance and Sexual Activity  Drug Use Yes   Types: "Crack" cocaine, Cocaine    Social History   Socioeconomic History   Marital status: Single    Spouse name: Not on file   Number of children: Not on file   Years of education: Not on file   Highest education level: Not on file  Occupational History   Not on file  Tobacco Use   Smoking status: Every Day    Packs/day: 0.50    Years: 20.00    Pack years: 10.00    Types: Cigarettes   Smokeless tobacco: Never  Vaping Use   Vaping Use: Never used  Substance and Sexual Activity   Alcohol use: Yes    Alcohol/week: 3.0 standard drinks    Types: 3 Cans of beer per week    Comment: Pt has been clean for 8 months   Drug use: Yes    Types: "Crack" cocaine, Cocaine   Sexual activity: Yes    Birth control/protection: Condom  Other Topics Concern   Not on file  Social History Narrative   Lives alone.  Has sister in area.   Social Determinants of Health   Financial Resource Strain: Not on file  Food Insecurity: Not on file  Transportation Needs: Not on file  Physical Activity: Not on file  Stress: Not on file  Social Connections: Not on file   SDOH:  SDOH Screenings   Alcohol Screen: Low Risk    Last Alcohol Screening Score (AUDIT): 5   Depression (PHQ2-9): Medium Risk   PHQ-2 Score: 12  Financial Resource Strain: Not on file  Food Insecurity: Not on file  Housing: Not on file  Physical Activity: Not on file  Social Connections: Not on file  Stress: Not on file  Tobacco Use: High Risk   Smoking Tobacco Use: Every Day   Smokeless Tobacco Use: Never   Passive Exposure: Not on file  Transportation Needs: Not on file   Additional Social History:      Sleep: Good  Appetite:  Good  Current Medications:  Current Facility-Administered Medications  Medication Dose Route Frequency Provider Last Rate Last Admin   acetaminophen (TYLENOL) tablet 650 mg  650 mg Oral Q6H PRN Rozetta Nunnery, NP  albuterol (VENTOLIN HFA) 108 (90 Base) MCG/ACT inhaler 1-2 puff  1-2 puff Inhalation Q6H PRN Rozetta Nunnery, NP       alum & mag hydroxide-simeth (MAALOX/MYLANTA) 200-200-20 MG/5ML suspension 30 mL  30 mL Oral Q4H PRN [place] Romp A, NP       ARIPiprazole (ABILIFY) tablet 5 mg  5 mg Oral Daily [place] Romp A, NP   5 mg at 01/06/21 6568   aspirin EC tablet 81 mg  81 mg Oral Daily [place] Romp A, NP   81 mg at 01/06/21 1275   clopidogrel (PLAVIX) tablet 75 mg  75 mg Oral Daily [place] Romp A, NP   75 mg at 01/06/21 1700   DULoxetine (CYMBALTA) DR capsule 20 mg  20 mg Oral Daily [place] Romp A, NP   20 mg at 01/06/21 1749   gabapentin (NEURONTIN) capsule 200 mg  200 mg Oral TID [place] Romp A, NP   200 mg at 01/06/21 4496   lamoTRIgine (LAMICTAL) tablet 25 mg  25 mg Oral Daily [place] Romp A, NP   25 mg at 01/06/21 7591   magnesium hydroxide (MILK OF MAGNESIA) suspension 30 mL  30 mL Oral Daily PRN Rozetta Nunnery, NP       metFORMIN (GLUCOPHAGE-XR) 24 hr tablet 1,000 mg  1,000 mg Oral Q supper [place] Romp A, NP       metFORMIN (GLUCOPHAGE-XR) 24 hr tablet 500 mg  500 mg Oral Q breakfast [place] Romp A, NP   500 mg at 01/06/21 0831   metoprolol succinate (TOPROL-XL) 24 hr tablet 25 mg  25 mg Oral Daily [place] Romp A, NP   25 mg at  01/06/21 0928   pantoprazole (PROTONIX) EC tablet 40 mg  40 mg Oral Daily [place] Romp A, NP   40 mg at 01/06/21 0929   rosuvastatin (CRESTOR) tablet 40 mg  40 mg Oral Daily [place] Romp A, NP   40 mg at 01/06/21 6384   traZODone (DESYREL) tablet 50 mg  50 mg Oral QHS PRN Rozetta Nunnery, NP       Current Outpatient Medications  Medication Sig Dispense Refill   albuterol (VENTOLIN HFA) 108 (90 Base) MCG/ACT inhaler Inhale 1-2 puffs into the lungs every 6 (six) hours as needed for wheezing or shortness of breath. 1 each 0   ARIPiprazole (ABILIFY) 10 MG tablet Take 5 mg by mouth daily.     aspirin EC 81 MG tablet Take 81 mg by mouth daily. Swallow whole.     clopidogrel (PLAVIX) 75 MG tablet Take 1 tablet (75 mg total) by mouth daily. 30 tablet 0   diclofenac Sodium (VOLTAREN) 1 % GEL Apply 2 g topically 4 (four) times daily as needed (to affected sites- for pain).     DULoxetine (CYMBALTA) 20 MG capsule Take 1 capsule (20 mg total) by mouth daily. 30 capsule 0   empagliflozin (JARDIANCE) 25 MG TABS tablet Take 12.5 mg by mouth daily.     gabapentin (NEURONTIN) 100 MG capsule Take 2 capsules (200 mg total) by mouth 3 (three) times daily. (Patient taking differently: Take 200 mg by mouth 3 (three) times daily as needed (AS DIRECTED).) 180 capsule 0   lamoTRIgine (LAMICTAL) 25 MG tablet Take 1 tablet (25 mg total) by mouth daily. 30 tablet 0   metFORMIN (FORTAMET) 1000 MG (OSM) 24 hr tablet Take 1 tablet (1,000 mg total) by mouth daily with supper. (Patient not taking: Reported on 01/05/2021) 30 tablet 0   metFORMIN (GLUCOPHAGE-XR)  500 MG 24 hr tablet Take 1 tablet (500 mg total) by mouth daily with breakfast. (Patient taking differently: Take 500 mg by mouth 2 (two) times a week.) 30 tablet 0   metoprolol succinate (TOPROL-XL) 25 MG 24 hr tablet Take 1 tablet (25 mg total) by mouth daily. 30 tablet 0   Multiple Vitamin (MULTIVITAMIN WITH MINERALS) TABS tablet Take 1 tablet by mouth daily as needed  (for supplementation).     nicotine polacrilex (NICORETTE) 2 MG gum Take 1 each (2 mg total) by mouth as needed for smoking cessation. 100 tablet 0   omeprazole (PRILOSEC) 20 MG capsule Take 20 mg by mouth daily before breakfast.     pantoprazole (PROTONIX) 40 MG tablet Take 1 tablet (40 mg total) by mouth daily. (Patient not taking: Reported on 01/05/2021) 30 tablet 1   polyethylene glycol powder (GLYCOLAX/MIRALAX) 17 GM/SCOOP powder Take 17 g by mouth daily as needed for mild constipation (MIX AND DRINK AS DIRECTED).     rosuvastatin (CRESTOR) 40 MG tablet Take 40 mg by mouth daily. (Patient not taking: Reported on 01/05/2021)     traZODone (DESYREL) 50 MG tablet Take 50 mg by mouth at bedtime as needed for sleep.      Labs  Lab Results:  Admission on 01/05/2021  Component Date Value Ref Range Status   Glucose-Capillary 01/06/2021 123 (H)  70 - 99 mg/dL Final   Glucose reference range applies only to samples taken after fasting for at least 8 hours.  Admission on 01/04/2021, Discharged on 01/05/2021  Component Date Value Ref Range Status   Sodium 01/04/2021 134 (L)  135 - 145 mmol/L Final   Potassium 01/04/2021 3.7  3.5 - 5.1 mmol/L Final   Chloride 01/04/2021 104  98 - 111 mmol/L Final   CO2 01/04/2021 25  22 - 32 mmol/L Final   Glucose, Bld 01/04/2021 145 (H)  70 - 99 mg/dL Final   Glucose reference range applies only to samples taken after fasting for at least 8 hours.   BUN 01/04/2021 13  8 - 23 mg/dL Final   Creatinine, Ser 01/04/2021 0.95  0.61 - 1.24 mg/dL Final   Calcium 01/04/2021 8.9  8.9 - 10.3 mg/dL Final   Total Protein 01/04/2021 6.7  6.5 - 8.1 g/dL Final   Albumin 01/04/2021 3.7  3.5 - 5.0 g/dL Final   AST 01/04/2021 25  15 - 41 U/L Final   ALT 01/04/2021 23  0 - 44 U/L Final   Alkaline Phosphatase 01/04/2021 64  38 - 126 U/L Final   Total Bilirubin 01/04/2021 0.5  0.3 - 1.2 mg/dL Final   GFR, Estimated 01/04/2021 >60  >60 mL/min Final   Comment: (NOTE) Calculated  using the CKD-EPI Creatinine Equation (2021)    Anion gap 01/04/2021 5  5 - 15 Final   Performed at [facility] [address] [place], [place] [address]   Alcohol, Ethyl (B) 01/04/2021 <10  <10 mg/dL Final   Comment: (NOTE) Lowest detectable limit for serum alcohol is 10 mg/dL.  For medical purposes only. Performed at [facility], [place] [address] [place] [address], [place], [place] [address]    Salicylate Lvl 67/01/4579 <7.0 (L)  7.0 - 30.0 mg/dL Final   Performed at [facility] [address], [place], [place] [address]   Acetaminophen (Tylenol), Serum 01/04/2021 <10 (L)  10 - 30 ug/mL Final   Comment: (NOTE) Therapeutic concentrations vary significantly. A range of 10-30 ug/mL  may be an  effective concentration for many patients. However, some  are best treated at concentrations outside of this range. Acetaminophen concentrations >150 ug/mL at 4 hours after ingestion  and >50 ug/mL at 12 hours after ingestion are often associated with  toxic reactions.  Performed at [facility], [place] [address] [place], [place] [address]    WBC 01/04/2021 4.6  4.0 - 10.5 K/uL Final   RBC 01/04/2021 4.90  4.22 - 5.81 MIL/uL Final   Hemoglobin 01/04/2021 14.0  13.0 - 17.0 g/dL Final   HCT 01/04/2021 42.3  39.0 - 52.0 % Final   MCV 01/04/2021 86.3  80.0 - 100.0 fL Final   MCH 01/04/2021 28.6  26.0 - 34.0 pg Final   MCHC 01/04/2021 33.1  30.0 - 36.0 g/dL Final   RDW 01/04/2021 14.4  11.5 - 15.5 % Final   Platelets 01/04/2021 204  150 - 400 K/uL Final   nRBC 01/04/2021 0.0  0.0 - 0.2 % Final   Performed at [facility] [address] [place], [place] [address]   Opiates 01/04/2021 NONE DETECTED  NONE DETECTED Final   Cocaine 01/04/2021 POSITIVE (A)  NONE DETECTED Final   Benzodiazepines 01/04/2021 NONE DETECTED  NONE DETECTED Final   Amphetamines 01/04/2021 NONE DETECTED  NONE DETECTED Final   Tetrahydrocannabinol 01/04/2021 POSITIVE (A)  NONE DETECTED Final   Barbiturates  01/04/2021 NONE DETECTED  NONE DETECTED Final   Comment: (NOTE) DRUG SCREEN FOR MEDICAL PURPOSES ONLY.  IF CONFIRMATION IS NEEDED FOR ANY PURPOSE, NOTIFY LAB WITHIN 5 DAYS.  LOWEST DETECTABLE LIMITS FOR URINE DRUG SCREEN Drug Class                     Cutoff (ng/mL) Amphetamine and metabolites    1000 Barbiturate and metabolites    200 Benzodiazepine                 761 Tricyclics and metabolites     300 Opiates and metabolites        300 Cocaine and metabolites        300 THC                            50 Performed at [facility], [place] [address] [place], [place] [address]    SARS Coronavirus 2 by RT PCR 01/04/2021 NEGATIVE  NEGATIVE Final   Comment: (NOTE) SARS-CoV-2 target nucleic acids are NOT DETECTED.  The SARS-CoV-2 RNA is generally detectable in upper respiratory specimens during the acute phase of infection. The lowest concentration of SARS-CoV-2 viral copies this assay can detect is 138 copies/mL. A negative result does not preclude SARS-Cov-2 infection and should not be used as the sole basis for treatment or other patient management decisions. A negative result may occur with  improper specimen collection/handling, submission of specimen other than nasopharyngeal swab, presence of viral mutation(s) within the areas targeted by this assay, and inadequate number of viral copies(<138 copies/mL). A negative result must be combined with clinical observations, patient history, and epidemiological information. The expected result is Negative.  Fact Sheet for Patients:  EntrepreneurPulse.com.au  Fact Sheet for Healthcare Providers:  IncredibleEmployment.be  This test is no                          t yet approved or cleared by the [place] FDA and  has been authorized for detection and/or diagnosis  of SARS-CoV-2 by FDA under an Emergency Use Authorization (EUA). This EUA will remain  in effect (meaning this test can be  used) for the duration of the COVID-19 declaration under Section 564(b)(1) of the Act, 21 U.S.C.section 360bbb-3(b)(1), unless the authorization is terminated  or revoked sooner.       Influenza A by PCR 01/04/2021 NEGATIVE  NEGATIVE Final   Influenza B by PCR 01/04/2021 NEGATIVE  NEGATIVE Final   Comment: (NOTE) The Xpert Xpress SARS-CoV-2/FLU/RSV plus assay is intended as an aid in the diagnosis of influenza from Nasopharyngeal swab specimens and should not be used as a sole basis for treatment. Nasal washings and aspirates are unacceptable for Xpert Xpress SARS-CoV-2/FLU/RSV testing.  Fact Sheet for Patients: EntrepreneurPulse.com.au  Fact Sheet for Healthcare Providers: IncredibleEmployment.be  This test is not yet approved or cleared by the [place] FDA and has been authorized for detection and/or diagnosis of SARS-CoV-2 by FDA under an Emergency Use Authorization (EUA). This EUA will remain in effect (meaning this test can be used) for the duration of the COVID-19 declaration under Section 564(b)(1) of the Act, 21 U.S.C. section 360bbb-3(b)(1), unless the authorization is terminated or revoked.  Performed at [facility], [place] [address], [place], [place] [address]    Glucose-Capillary 01/04/2021 150 (H)  70 - 99 mg/dL Final   Glucose reference range applies only to samples taken after fasting for at least 8 hours.  Admission on 12/08/2020, Discharged on 12/16/2020  Component Date Value Ref Range Status   Glucose-Capillary 12/09/2020 172 (H)  70 - 99 mg/dL Final   Glucose reference range applies only to samples taken after fasting for at least 8 hours.   TSH 12/10/2020 0.977  0.350 - 4.500 uIU/mL Final   Comment: Performed by a 3rd Generation assay with a functional sensitivity of <=0.01 uIU/mL. Performed at [facility], [place] [address] [place], [place] [address]    Hgb A1c MFr Bld 12/10/2020 6.8 (H)   4.8 - 5.6 % Final   Comment: (NOTE) Pre diabetes:          5.7%-6.4%  Diabetes:              >6.4%  Glycemic control for   <7.0% adults with diabetes    Mean Plasma Glucose 12/10/2020 148.46  mg/dL Final   Performed at [facility], [place] [address] [place], [place] [address]   Cholesterol 12/10/2020 177  0 - 200 mg/dL Final   Triglycerides 12/10/2020 124  <150 mg/dL Final   HDL 12/10/2020 50  >40 mg/dL Final   Total CHOL/HDL Ratio 12/10/2020 3.5  RATIO Final   VLDL 12/10/2020 25  0 - 40 mg/dL Final   LDL Cholesterol 12/10/2020 102 (H)  0 - 99 mg/dL Final   Comment:        Total Cholesterol/HDL:CHD Risk Coronary Heart Disease Risk Table                     Men   Women  1/2 Average Risk   3.4   3.3  Average Risk       5.0   4.4  2 X Average Risk   9.6   7.1  3 X Average Risk  23.4   11.0        Use the calculated Patient Ratio above and the CHD Risk Table to determine the patient's CHD Risk.        ATP III CLASSIFICATION (LDL):  <100  mg/dL   Optimal  100-129  mg/dL   Near or Above                    Optimal  130-159  mg/dL   Borderline  160-189  mg/dL   High  >190     mg/dL   Very High Performed at [facility] [address], [place], [place] [address]    Glucose-Capillary 12/09/2020 191 (H)  70 - 99 mg/dL Final   Glucose reference range applies only to samples taken after fasting for at least 8 hours.   Glucose-Capillary 12/10/2020 118 (H)  70 - 99 mg/dL Final   Glucose reference range applies only to samples taken after fasting for at least 8 hours.   Glucose-Capillary 12/10/2020 136 (H)  70 - 99 mg/dL Final   Glucose reference range applies only to samples taken after fasting for at least 8 hours.   Glucose-Capillary 12/10/2020 125 (H)  70 - 99 mg/dL Final   Glucose reference range applies only to samples taken after fasting for at least 8 hours.   Glucose-Capillary 12/11/2020 96  70 - 99 mg/dL Final   Glucose reference range applies  only to samples taken after fasting for at least 8 hours.   Glucose-Capillary 12/11/2020 92  70 - 99 mg/dL Final   Glucose reference range applies only to samples taken after fasting for at least 8 hours.   SARS Coronavirus 2 by RT PCR 12/11/2020 NEGATIVE  NEGATIVE Final   Comment: (NOTE) SARS-CoV-2 target nucleic acids are NOT DETECTED.  The SARS-CoV-2 RNA is generally detectable in upper respiratory specimens during the acute phase of infection. The lowest concentration of SARS-CoV-2 viral copies this assay can detect is 138 copies/mL. A negative result does not preclude SARS-Cov-2 infection and should not be used as the sole basis for treatment or other patient management decisions. A negative result may occur with  improper specimen collection/handling, submission of specimen other than nasopharyngeal swab, presence of viral mutation(s) within the areas targeted by this assay, and inadequate number of viral copies(<138 copies/mL). A negative result must be combined with clinical observations, patient history, and epidemiological information. The expected result is Negative.  Fact Sheet for Patients:  EntrepreneurPulse.com.au  Fact Sheet for Healthcare Providers:  IncredibleEmployment.be  This test is no                          t yet approved or cleared by the [place] FDA and  has been authorized for detection and/or diagnosis of SARS-CoV-2 by FDA under an Emergency Use Authorization (EUA). This EUA will remain  in effect (meaning this test can be used) for the duration of the COVID-19 declaration under Section 564(b)(1) of the Act, 21 U.S.C.section 360bbb-3(b)(1), unless the authorization is terminated  or revoked sooner.       Influenza A by PCR 12/11/2020 NEGATIVE  NEGATIVE Final   Influenza B by PCR 12/11/2020 NEGATIVE  NEGATIVE Final   Comment: (NOTE) The Xpert Xpress SARS-CoV-2/FLU/RSV plus assay is intended as an aid in the  diagnosis of influenza from Nasopharyngeal swab specimens and should not be used as a sole basis for treatment. Nasal washings and aspirates are unacceptable for Xpert Xpress SARS-CoV-2/FLU/RSV testing.  Fact Sheet for Patients: EntrepreneurPulse.com.au  Fact Sheet for Healthcare Providers: IncredibleEmployment.be  This test is not yet approved or cleared by the [place] FDA and has been authorized for detection and/or diagnosis of SARS-CoV-2  by FDA under an Emergency Use Authorization (EUA). This EUA will remain in effect (meaning this test can be used) for the duration of the COVID-19 declaration under Section 564(b)(1) of the Act, 21 U.S.C. section 360bbb-3(b)(1), unless the authorization is terminated or revoked.  Performed at [facility], [place] [address], [place], [place] [address]    Glucose-Capillary 12/11/2020 107 (H)  70 - 99 mg/dL Final   Glucose reference range applies only to samples taken after fasting for at least 8 hours.   Glucose-Capillary 12/12/2020 114 (H)  70 - 99 mg/dL Final   Glucose reference range applies only to samples taken after fasting for at least 8 hours.   Comment 1 12/12/2020 Notify RN   Final   Glucose-Capillary 12/12/2020 149 (H)  70 - 99 mg/dL Final   Glucose reference range applies only to samples taken after fasting for at least 8 hours.   Glucose-Capillary 12/12/2020 138 (H)  70 - 99 mg/dL Final   Glucose reference range applies only to samples taken after fasting for at least 8 hours.   Glucose-Capillary 12/13/2020 114 (H)  70 - 99 mg/dL Final   Glucose reference range applies only to samples taken after fasting for at least 8 hours.   Glucose-Capillary 12/13/2020 107 (H)  70 - 99 mg/dL Final   Glucose reference range applies only to samples taken after fasting for at least 8 hours.   Glucose-Capillary 12/14/2020 139 (H)  70 - 99 mg/dL Final   Glucose reference range applies only to  samples taken after fasting for at least 8 hours.   Glucose-Capillary 12/14/2020 105 (H)  70 - 99 mg/dL Final   Glucose reference range applies only to samples taken after fasting for at least 8 hours.   Glucose-Capillary 12/15/2020 121 (H)  70 - 99 mg/dL Final   Glucose reference range applies only to samples taken after fasting for at least 8 hours.   Glucose-Capillary 12/16/2020 182 (H)  70 - 99 mg/dL Final   Glucose reference range applies only to samples taken after fasting for at least 8 hours.   Comment 1 12/16/2020 Notify RN   Final   Glucose-Capillary 12/15/2020 172 (H)  70 - 99 mg/dL Final   Glucose reference range applies only to samples taken after fasting for at least 8 hours.   Comment 1 12/15/2020 Notify RN   Final  Admission on 12/05/2020, Discharged on 12/08/2020  Component Date Value Ref Range Status   SARS Coronavirus 2 by RT PCR 12/05/2020 NEGATIVE  NEGATIVE Final   Comment: (NOTE) SARS-CoV-2 target nucleic acids are NOT DETECTED.  The SARS-CoV-2 RNA is generally detectable in upper respiratory specimens during the acute phase of infection. The lowest concentration of SARS-CoV-2 viral copies this assay can detect is 138 copies/mL. A negative result does not preclude SARS-Cov-2 infection and should not be used as the sole basis for treatment or other patient management decisions. A negative result may occur with  improper specimen collection/handling, submission of specimen other than nasopharyngeal swab, presence of viral mutation(s) within the areas targeted by this assay, and inadequate number of viral copies(<138 copies/mL). A negative result must be combined with clinical observations, patient history, and epidemiological information. The expected result is Negative.  Fact Sheet for Patients:  EntrepreneurPulse.com.au  Fact Sheet for Healthcare Providers:  IncredibleEmployment.be  This test is no  t yet approved or cleared by the [place] and  has been authorized for detection and/or diagnosis of SARS-CoV-2 by FDA under an Emergency Use Authorization (EUA). This EUA will remain  in effect (meaning this test can be used) for the duration of the COVID-19 declaration under Section 564(b)(1) of the Act, 21 U.S.C.section 360bbb-3(b)(1), unless the authorization is terminated  or revoked sooner.       Influenza A by PCR 12/05/2020 NEGATIVE  NEGATIVE Final   Influenza B by PCR 12/05/2020 NEGATIVE  NEGATIVE Final   Comment: (NOTE) The Xpert Xpress SARS-CoV-2/FLU/RSV plus assay is intended as an aid in the diagnosis of influenza from Nasopharyngeal swab specimens and should not be used as a sole basis for treatment. Nasal washings and aspirates are unacceptable for Xpert Xpress SARS-CoV-2/FLU/RSV testing.  Fact Sheet for Patients: EntrepreneurPulse.com.au  Fact Sheet for Healthcare Providers: IncredibleEmployment.be  This test is not yet approved or cleared by the [place] FDA and has been authorized for detection and/or diagnosis of SARS-CoV-2 by FDA under an Emergency Use Authorization (EUA). This EUA will remain in effect (meaning this test can be used) for the duration of the COVID-19 declaration under Section 564(b)(1) of the Act, 21 U.S.C. section 360bbb-3(b)(1), unless the authorization is terminated or revoked.  Performed at [facility], [place] [address], [place], [place] [address]    Sodium 12/05/2020 135  135 - 145 mmol/L Final   Potassium 12/05/2020 3.9  3.5 - 5.1 mmol/L Final   Chloride 12/05/2020 105  98 - 111 mmol/L Final   CO2 12/05/2020 24  22 - 32 mmol/L Final   Glucose, Bld 12/05/2020 222 (H)  70 - 99 mg/dL Final   Glucose reference range applies only to samples taken after fasting for at least 8 hours.   BUN 12/05/2020 11  8 - 23 mg/dL Final   Creatinine, Ser 12/05/2020 1.01  0.61 - 1.24  mg/dL Final   Calcium 12/05/2020 9.0  8.9 - 10.3 mg/dL Final   Total Protein 12/05/2020 6.9  6.5 - 8.1 g/dL Final   Albumin 12/05/2020 3.7  3.5 - 5.0 g/dL Final   AST 12/05/2020 18  15 - 41 U/L Final   ALT 12/05/2020 17  0 - 44 U/L Final   Alkaline Phosphatase 12/05/2020 50  38 - 126 U/L Final   Total Bilirubin 12/05/2020 0.7  0.3 - 1.2 mg/dL Final   GFR, Estimated 12/05/2020 >60  >60 mL/min Final   Comment: (NOTE) Calculated using the CKD-EPI Creatinine Equation (2021)    Anion gap 12/05/2020 6  5 - 15 Final   Performed at [facility], [place] [address], [place], [place] [address]   Alcohol, Ethyl (B) 12/05/2020 <10  <10 mg/dL Final   Comment: (NOTE) Lowest detectable limit for serum alcohol is 10 mg/dL.  For medical purposes only. Performed at [facility], [place] [address] [place], [place] [address]    Opiates 12/05/2020 NONE DETECTED  NONE DETECTED Final   Cocaine 12/05/2020 POSITIVE (A)  NONE DETECTED Final   Benzodiazepines 12/05/2020 NONE DETECTED  NONE DETECTED Final   Amphetamines 12/05/2020 NONE DETECTED  NONE DETECTED Final   Tetrahydrocannabinol 12/05/2020 NONE DETECTED  NONE DETECTED Final   Barbiturates 12/05/2020 NONE DETECTED  NONE DETECTED Final   Comment: (NOTE) DRUG SCREEN FOR MEDICAL PURPOSES ONLY.  IF CONFIRMATION IS NEEDED FOR ANY PURPOSE, NOTIFY LAB WITHIN 5 DAYS.  LOWEST DETECTABLE LIMITS FOR URINE DRUG SCREEN Drug Class  Cutoff (ng/mL) Amphetamine and metabolites    1000 Barbiturate and metabolites    200 Benzodiazepine                 253 Tricyclics and metabolites     300 Opiates and metabolites        300 Cocaine and metabolites        300 THC                            50 Performed at [facility], [place] [address], [place], [place] [address]    WBC 12/05/2020 4.6  4.0 - 10.5 K/uL Final   RBC 12/05/2020 5.17  4.22 - 5.81 MIL/uL Final   Hemoglobin 12/05/2020 14.8  13.0 -  17.0 g/dL Final   HCT 12/05/2020 44.3  39.0 - 52.0 % Final   MCV 12/05/2020 85.7  80.0 - 100.0 fL Final   MCH 12/05/2020 28.6  26.0 - 34.0 pg Final   MCHC 12/05/2020 33.4  30.0 - 36.0 g/dL Final   RDW 12/05/2020 13.9  11.5 - 15.5 % Final   Platelets 12/05/2020 212  150 - 400 K/uL Final   nRBC 12/05/2020 0.0  0.0 - 0.2 % Final   Neutrophils Relative % 12/05/2020 71  % Final   Neutro Abs 12/05/2020 3.3  1.7 - 7.7 K/uL Final   Lymphocytes Relative 12/05/2020 21  % Final   Lymphs Abs 12/05/2020 1.0  0.7 - 4.0 K/uL Final   Monocytes Relative 12/05/2020 7  % Final   Monocytes Absolute 12/05/2020 0.3  0.1 - 1.0 K/uL Final   Eosinophils Relative 12/05/2020 1  % Final   Eosinophils Absolute 12/05/2020 0.1  0.0 - 0.5 K/uL Final   Basophils Relative 12/05/2020 0  % Final   Basophils Absolute 12/05/2020 0.0  0.0 - 0.1 K/uL Final   Immature Granulocytes 12/05/2020 0  % Final   Abs Immature Granulocytes 12/05/2020 0.01  0.00 - 0.07 K/uL Final   Performed at [facility], [place] [address], [place], [place] [address]   Acetaminophen (Tylenol), Serum 12/05/2020 <10 (L)  10 - 30 ug/mL Final   Comment: (NOTE) Therapeutic concentrations vary significantly. A range of 10-30 ug/mL  may be an effective concentration for many patients. However, some  are best treated at concentrations outside of this range. Acetaminophen concentrations >150 ug/mL at 4 hours after ingestion  and >50 ug/mL at 12 hours after ingestion are often associated with  toxic reactions.  Performed at [facility], [place] [address] [place], [place] [address]    Salicylate Lvl 87/56/4332 <7.0 (L)  7.0 - 30.0 mg/dL Final   Performed at [place] [address], [place],  [address]   Glucose-Capillary 12/05/2020 191 (H)  70 - 99 mg/dL Final   Glucose reference range applies only to samples taken after fasting for at least 8 hours.   Glucose-Capillary 12/08/2020 107 (H)   70 - 99 mg/dL Final   Glucose reference range applies only to samples taken after fasting for at least 8 hours.   SARS Coronavirus 2 by RT PCR 12/08/2020 NEGATIVE  NEGATIVE Final   Comment: (NOTE) SARS-CoV-2 target nucleic acids are NOT DETECTED.  The SARS-CoV-2 RNA is generally detectable in upper respiratory specimens during the acute phase of infection. The lowest concentration of SARS-CoV-2 viral copies this assay can detect is 138 copies/mL. A negative result does not preclude SARS-Cov-2 infection and  should not be used as the sole basis for treatment or other patient management decisions. A negative result may occur with  improper specimen collection/handling, submission of specimen other than nasopharyngeal swab, presence of viral mutation(s) within the areas targeted by this assay, and inadequate number of viral copies(<138 copies/mL). A negative result must be combined with clinical observations, patient history, and epidemiological information. The expected result is Negative.  Fact Sheet for Patients:  EntrepreneurPulse.com.au  Fact Sheet for Healthcare Providers:  IncredibleEmployment.be  This test is no                          t yet approved or cleared by the [place] FDA and  has been authorized for detection and/or diagnosis of SARS-CoV-2 by FDA under an Emergency Use Authorization (EUA). This EUA will remain  in effect (meaning this test can be used) for the duration of the COVID-19 declaration under Section 564(b)(1) of the Act, 21 U.S.C.section 360bbb-3(b)(1), unless the authorization is terminated  or revoked sooner.       Influenza A by PCR 12/08/2020 NEGATIVE  NEGATIVE Final   Influenza B by PCR 12/08/2020 NEGATIVE  NEGATIVE Final   Comment: (NOTE) The Xpert Xpress SARS-CoV-2/FLU/RSV plus assay is intended as an aid in the diagnosis of influenza from Nasopharyngeal swab specimens and should not be used as a sole  basis for treatment. Nasal washings and aspirates are unacceptable for Xpert Xpress SARS-CoV-2/FLU/RSV testing.  Fact Sheet for Patients: EntrepreneurPulse.com.au  Fact Sheet for Healthcare Providers: IncredibleEmployment.be  This test is not yet approved or cleared by the [place] FDA and has been authorized for detection and/or diagnosis of SARS-CoV-2 by FDA under an Emergency Use Authorization (EUA). This EUA will remain in effect (meaning this test can be used) for the duration of the COVID-19 declaration under Section 564(b)(1) of the Act, 21 U.S.C. section 360bbb-3(b)(1), unless the authorization is terminated or revoked.  Performed at [facility], [place] [address], [place], [place] [address]   Admission on 10/09/2020, Discharged on 10/11/2020  Component Date Value Ref Range Status   Glucose-Capillary 10/09/2020 153 (H)  70 - 99 mg/dL Final   Glucose reference range applies only to samples taken after fasting for at least 8 hours.   pH, Ven 10/09/2020 7.381  7.250 - 7.430 Final   pCO2, Ven 10/09/2020 42.8 (L)  44.0 - 60.0 mmHg Final   pO2, Ven 10/09/2020 37.0  32.0 - 45.0 mmHg Final   Bicarbonate 10/09/2020 25.4  20.0 - 28.0 mmol/L Final   TCO2 10/09/2020 27  22 - 32 mmol/L Final   O2 Saturation 10/09/2020 70.0  % Final   Acid-Base Excess 10/09/2020 0.0  0.0 - 2.0 mmol/L Final   Sodium 10/09/2020 143  135 - 145 mmol/L Final   Potassium 10/09/2020 3.8  3.5 - 5.1 mmol/L Final   Calcium, Ion 10/09/2020 1.26  1.15 - 1.40 mmol/L Final   HCT 10/09/2020 41.0  39.0 - 52.0 % Final   Hemoglobin 10/09/2020 13.9  13.0 - 17.0 g/dL Final   Sample type 10/09/2020 MIXED VENOUS SAMPLE   Final   Glucose-Capillary 10/09/2020 127 (H)  70 - 99 mg/dL Final   Glucose reference range applies only to samples taken after fasting for at least 8 hours.   SARS Coronavirus 2 10/09/2020 NEGATIVE  NEGATIVE Final   Comment: (NOTE) SARS-CoV-2  target nucleic acids are NOT DETECTED.  The SARS-CoV-2 RNA is generally detectable in upper and  lower respiratory specimens during the acute phase of infection. Negative results do not preclude SARS-CoV-2 infection, do not rule out co-infections with other pathogens, and should not be used as the sole basis for treatment or other patient management decisions. Negative results must be combined with clinical observations, patient history, and epidemiological information. The expected result is Negative.  Fact Sheet for Patients: SugarRoll.be  Fact Sheet for Healthcare Providers: https://www.woods-mathews.com/  This test is not yet approved or cleared by the [place] FDA and  has been authorized for detection and/or diagnosis of SARS-CoV-2 by FDA under an Emergency Use Authorization (EUA). This EUA will remain  in effect (meaning this test can be used) for the duration of the COVID-19 declaration under Se                          ction 564(b)(1) of the Act, 21 U.S.C. section 360bbb-3(b)(1), unless the authorization is terminated or revoked sooner.  Performed at [facility], [place] [address], [place], [place] [address]    Weight 10/09/2020 3,120  oz Final   Height 10/09/2020 70  in Final   BP 10/09/2020 136/80  mmHg Final   Single Plane A2C EF 10/09/2020 48.1  % Final   Single Plane A4C EF 10/09/2020 35.3  % Final   Calc EF 10/09/2020 44.3  % Final   S' Lateral 10/09/2020 4.50  cm Final   AR max vel 10/09/2020 1.78  cm2 Final   AV Area VTI 10/09/2020 1.71  cm2 Final   AV Mean grad 10/09/2020 3.0  mmHg Final   AV Peak grad 10/09/2020 5.7  mmHg Final   Ao pk vel 10/09/2020 1.19  m/s Final   Area-P 1/2 10/09/2020 4.80  cm2 Final   AV Area mean vel 10/09/2020 1.69  cm2 Final   Glucose-Capillary 10/09/2020 96  70 - 99 mg/dL Final   Glucose reference range applies only to samples taken after fasting for at least 8 hours.   Sodium  10/10/2020 136  135 - 145 mmol/L Final   Potassium 10/10/2020 4.1  3.5 - 5.1 mmol/L Final   Chloride 10/10/2020 105  98 - 111 mmol/L Final   CO2 10/10/2020 24  22 - 32 mmol/L Final   Glucose, Bld 10/10/2020 132 (H)  70 - 99 mg/dL Final   Glucose reference range applies only to samples taken after fasting for at least 8 hours.   BUN 10/10/2020 13  8 - 23 mg/dL Final   Creatinine, Ser 10/10/2020 0.93  0.61 - 1.24 mg/dL Final   Calcium 10/10/2020 9.2  8.9 - 10.3 mg/dL Final   GFR, Estimated 10/10/2020 >60  >60 mL/min Final   Comment: (NOTE) Calculated using the CKD-EPI Creatinine Equation (2021)    Anion gap 10/10/2020 7  5 - 15 Final   Performed at [place] [address] [place], [place] [address]   WBC 10/10/2020 5.2  4.0 - 10.5 K/uL Final   RBC 10/10/2020 5.10  4.22 - 5.81 MIL/uL Final   Hemoglobin 10/10/2020 14.6  13.0 - 17.0 g/dL Final   HCT 10/10/2020 43.0  39.0 - 52.0 % Final   MCV 10/10/2020 84.3  80.0 - 100.0 fL Final   MCH 10/10/2020 28.6  26.0 - 34.0 pg Final   MCHC 10/10/2020 34.0  30.0 - 36.0 g/dL Final   RDW 10/10/2020 14.3  11.5 - 15.5 % Final   Platelets 10/10/2020 173  150 - 400 K/uL  Final   nRBC 10/10/2020 0.0  0.0 - 0.2 % Final   Performed at [facility], [address], [place], [place] [address]   Hgb A1c MFr Bld 10/10/2020 8.2 (H)  4.8 - 5.6 % Final   Comment: (NOTE) Pre diabetes:          5.7%-6.4%  Diabetes:              >6.4%  Glycemic control for   <7.0% adults with diabetes    Mean Plasma Glucose 10/10/2020 188.64  mg/dL Final   Performed at [facility], [address] [place], [place] [address]   Cholesterol 10/10/2020 135  0 - 200 mg/dL Final   Triglycerides [birth year]1977 95  <150 mg/dL Final   HDL 71/54/2985 34 (L)  >40 mg/dL Final   Total CHOL/HDL Ratio 10/10/2020 4.0  RATIO Final   VLDL 10/10/2020 19  0 - 40 mg/dL Final   LDL Cholesterol 10/10/2020 82  0 - 99 mg/dL Final   Comment:        Total Cholesterol/HDL:CHD  Risk Coronary Heart Disease Risk Table                     Men   Women  1/2 Average Risk   3.4   3.3  Average Risk       5.0   4.4  2 X Average Risk   9.6   7.1  3 X Average Risk  23.4   11.0        Use the calculated Patient Ratio above and the CHD Risk Table to determine the patient's CHD Risk.        ATP III CLASSIFICATION (LDL):  <100     mg/dL   Optimal  903-418  mg/dL   Near or Above                    Optimal  130-159  mg/dL   Borderline  885-418  mg/dL   High  >258     mg/dL   Very High Performed at [facility], [address], [place], [place] [address]    Glucose-Capillary 10/09/2020 179 (H)  70 - 99 mg/dL Final   Glucose reference range applies only to samples taken after fasting for at least 8 hours.   pH, Arterial 10/09/2020 7.428  7.350 - 7.450 Final   pCO2 arterial 10/09/2020 35.2  32.0 - 48.0 mmHg Final   pO2, Arterial 10/09/2020 100  83.0 - 108.0 mmHg Final   Bicarbonate 10/09/2020 23.2  20.0 - 28.0 mmol/L Final   TCO2 10/09/2020 24  22 - 32 mmol/L Final   O2 Saturation 10/09/2020 98.0  % Final   Acid-base deficit 10/09/2020 1.0  0.0 - 2.0 mmol/L Final   Sodium 10/09/2020 142  135 - 145 mmol/L Final   Potassium 10/09/2020 3.8  3.5 - 5.1 mmol/L Final   Calcium, Ion 10/09/2020 1.23  1.15 - 1.40 mmol/L Final   HCT 10/09/2020 41.0  39.0 - 52.0 % Final   Hemoglobin 10/09/2020 13.9  13.0 - 17.0 g/dL Final   Sample type 15/78/7394 ARTERIAL   Final   Glucose-Capillary 10/10/2020 140 (H)  70 - 99 mg/dL Final   Glucose reference range applies only to samples taken after fasting for at least 8 hours.   Total Protein 10/10/2020 6.0 (L)  6.5 - 8.1 g/dL Final   Albumin 76/93/4625 3.3 (L)  3.5 - 5.0 g/dL Final  AST 10/10/2020 21  15 - 41 U/L Final   ALT 10/10/2020 19  0 - 44 U/L Final   Alkaline Phosphatase 10/10/2020 52  38 - 126 U/L Final   Total Bilirubin 10/10/2020 0.8  0.3 - 1.2 mg/dL Final   Bilirubin, Direct 10/10/2020 0.1  0.0 - 0.2 mg/dL Final    Indirect Bilirubin 10/10/2020 0.7  0.3 - 0.9 mg/dL Final   Performed at [place] [address], [place],  [address]   MRSA, PCR 10/10/2020 NEGATIVE  NEGATIVE Final   Staphylococcus aureus 10/10/2020 NEGATIVE  NEGATIVE Final   Comment: (NOTE) The Xpert SA Assay (FDA approved for NASAL specimens in patients 56 years of age and older), is one component of a comprehensive surveillance program. It is not intended to diagnose infection nor to guide or monitor treatment. Performed at [facility], [place] [address], [place], [place] [address]    Glucose-Capillary 10/10/2020 75  70 - 99 mg/dL Final   Glucose reference range applies only to samples taken after fasting for at least 8 hours.   Glucose-Capillary 10/10/2020 78  70 - 99 mg/dL Final   Glucose reference range applies only to samples taken after fasting for at least 8 hours.   Sodium 10/11/2020 136  135 - 145 mmol/L Final   Potassium 10/11/2020 4.0  3.5 - 5.1 mmol/L Final   Chloride 10/11/2020 105  98 - 111 mmol/L Final   CO2 10/11/2020 21 (L)  22 - 32 mmol/L Final   Glucose, Bld 10/11/2020 224 (H)  70 - 99 mg/dL Final   Glucose reference range applies only to samples taken after fasting for at least 8 hours.   BUN 10/11/2020 14  8 - 23 mg/dL Final   Creatinine, Ser 10/11/2020 1.06  0.61 - 1.24 mg/dL Final   Calcium 10/11/2020 9.2  8.9 - 10.3 mg/dL Final   GFR, Estimated 10/11/2020 >60  >60 mL/min Final   Comment: (NOTE) Calculated using the CKD-EPI Creatinine Equation (2021)    Anion gap 10/11/2020 10  5 - 15 Final   Performed at [facility], [place] [address] [place], [place] [address]   WBC 10/11/2020 4.9  4.0 - 10.5 K/uL Final   RBC 10/11/2020 4.96  4.22 - 5.81 MIL/uL Final   Hemoglobin 10/11/2020 14.0  13.0 - 17.0 g/dL Final   HCT 10/11/2020 41.9  39.0 - 52.0 % Final   MCV 10/11/2020 84.5  80.0 - 100.0 fL Final   MCH 10/11/2020 28.2  26.0 - 34.0 pg Final   MCHC 10/11/2020 33.4  30.0 - 36.0 g/dL  Final   RDW 10/11/2020 14.3  11.5 - 15.5 % Final   Platelets 10/11/2020 172  150 - 400 K/uL Final   nRBC 10/11/2020 0.0  0.0 - 0.2 % Final   Performed at [place] [address] [place], [place] [address]   Glucose-Capillary 10/10/2020 79  70 - 99 mg/dL Final   Glucose reference range applies only to samples taken after fasting for at least 8 hours.   Activated Clotting Time 10/10/2020 202  seconds Final   Activated Clotting Time 10/10/2020 457  seconds Final   Glucose-Capillary 10/10/2020 250 (H)  70 - 99 mg/dL Final   Glucose reference range applies only to samples taken after fasting for at least 8 hours.   Glucose-Capillary 10/11/2020 127 (H)  70 - 99 mg/dL Final   Glucose reference range applies only to samples taken after fasting for at  least 8 hours.   Glucose-Capillary 10/10/2020 76  70 - 99 mg/dL Final   Glucose reference range applies only to samples taken after fasting for at least 8 hours.  Office Visit on 10/07/2020  Component Date Value Ref Range Status   Glucose 10/07/2020 193 (H)  65 - 99 mg/dL Final   BUN 10/07/2020 11  8 - 27 mg/dL Final   Creatinine, Ser 10/07/2020 0.95  0.76 - 1.27 mg/dL Final   eGFR 10/07/2020 90  >59 mL/min/1.73 Final   BUN/Creatinine Ratio 10/07/2020 12  10 - 24 Final   Sodium 10/07/2020 139  134 - 144 mmol/L Final   Potassium 10/07/2020 4.1  3.5 - 5.2 mmol/L Final   Chloride 10/07/2020 102  96 - 106 mmol/L Final   CO2 10/07/2020 24  20 - 29 mmol/L Final   Calcium 10/07/2020 9.7  8.6 - 10.2 mg/dL Final   WBC 10/07/2020 5.0  3.4 - 10.8 x10E3/uL Final   RBC 10/07/2020 5.18  4.14 - 5.80 x10E6/uL Final   Hemoglobin 10/07/2020 14.5  13.0 - 17.7 g/dL Final   Hematocrit 10/07/2020 44.3  37.5 - 51.0 % Final   MCV 10/07/2020 86  79 - 97 fL Final   MCH 10/07/2020 28.0  26.6 - 33.0 pg Final   MCHC 10/07/2020 32.7  31.5 - 35.7 g/dL Final   RDW 10/07/2020 14.6  11.6 - 15.4 % Final   Platelets 10/07/2020 194  150 - 450 x10E3/uL Final   Admission on 08/29/2020, Discharged on 08/30/2020  Component Date Value Ref Range Status   WBC 08/30/2020 5.4  4.0 - 10.5 K/uL Final   RBC 08/30/2020 4.82  4.22 - 5.81 MIL/uL Final   Hemoglobin 08/30/2020 13.4  13.0 - 17.0 g/dL Final   HCT 08/30/2020 41.3  39.0 - 52.0 % Final   MCV 08/30/2020 85.7  80.0 - 100.0 fL Final   MCH 08/30/2020 27.8  26.0 - 34.0 pg Final   MCHC 08/30/2020 32.4  30.0 - 36.0 g/dL Final   RDW 08/30/2020 14.8  11.5 - 15.5 % Final   Platelets 08/30/2020 197  150 - 400 K/uL Final   nRBC 08/30/2020 0.0  0.0 - 0.2 % Final   Neutrophils Relative % 08/30/2020 58  % Final   Neutro Abs 08/30/2020 3.1  1.7 - 7.7 K/uL Final   Lymphocytes Relative 08/30/2020 29  % Final   Lymphs Abs 08/30/2020 1.6  0.7 - 4.0 K/uL Final   Monocytes Relative 08/30/2020 10  % Final   Monocytes Absolute 08/30/2020 0.6  0.1 - 1.0 K/uL Final   Eosinophils Relative 08/30/2020 2  % Final   Eosinophils Absolute 08/30/2020 0.1  0.0 - 0.5 K/uL Final   Basophils Relative 08/30/2020 1  % Final   Basophils Absolute 08/30/2020 0.0  0.0 - 0.1 K/uL Final   Immature Granulocytes 08/30/2020 0  % Final   Abs Immature Granulocytes 08/30/2020 0.01  0.00 - 0.07 K/uL Final   Performed at [facility], [place] [address] [place], [place] [address]   Sodium 08/30/2020 138  135 - 145 mmol/L Final   Potassium 08/30/2020 3.8  3.5 - 5.1 mmol/L Final   Chloride 08/30/2020 104  98 - 111 mmol/L Final   CO2 08/30/2020 25  22 - 32 mmol/L Final   Glucose, Bld 08/30/2020 245 (H)  70 - 99 mg/dL Final   Glucose reference range applies only to samples taken after fasting for at least 8 hours.   BUN 08/30/2020 15  8 - 23 mg/dL Final   Creatinine, Ser 08/30/2020 1.09  0.61 - 1.24 mg/dL Final   Calcium 80/32/1616 9.2  8.9 - 10.3 mg/dL Final   GFR, Estimated 08/30/2020 >60  >60 mL/min Final   Comment: (NOTE) Calculated using the CKD-EPI Creatinine Equation (2021)    Anion gap 08/30/2020 9  5 - 15 Final   Performed at  [facility], [address], [place], [place] [address]   SARS Coronavirus 2 by RT PCR 08/30/2020 NEGATIVE  NEGATIVE Final   Comment: (NOTE) SARS-CoV-2 target nucleic acids are NOT DETECTED.  The SARS-CoV-2 RNA is generally detectable in upper respiratory specimens during the acute phase of infection. The lowest concentration of SARS-CoV-2 viral copies this assay can detect is 138 copies/mL. A negative result does not preclude SARS-Cov-2 infection and should not be used as the sole basis for treatment or other patient management decisions. A negative result may occur with  improper specimen collection/handling, submission of specimen other than nasopharyngeal swab, presence of viral mutation(s) within the areas targeted by this assay, and inadequate number of viral copies(<138 copies/mL). A negative result must be combined with clinical observations, patient history, and epidemiological information. The expected result is Negative.  Fact Sheet for Patients:  BloggerCourse.com  Fact Sheet for Healthcare Providers:  SeriousBroker.it  This test is no                          t yet approved or cleared by the [place] FDA and  has been authorized for detection and/or diagnosis of SARS-CoV-2 by FDA under an Emergency Use Authorization (EUA). This EUA will remain  in effect (meaning this test can be used) for the duration of the COVID-19 declaration under Section 564(b)(1) of the Act, 21 U.S.C.section 360bbb-3(b)(1), unless the authorization is terminated  or revoked sooner.       Influenza A by PCR 08/30/2020 NEGATIVE  NEGATIVE Final   Influenza B by PCR 08/30/2020 NEGATIVE  NEGATIVE Final   Comment: (NOTE) The Xpert Xpress SARS-CoV-2/FLU/RSV plus assay is intended as an aid in the diagnosis of influenza from Nasopharyngeal swab specimens and should not be used as a sole basis for treatment. Nasal washings and aspirates  are unacceptable for Xpert Xpress SARS-CoV-2/FLU/RSV testing.  Fact Sheet for Patients: BloggerCourse.com  Fact Sheet for Healthcare Providers: SeriousBroker.it  This test is not yet approved or cleared by the [place] FDA and has been authorized for detection and/or diagnosis of SARS-CoV-2 by FDA under an Emergency Use Authorization (EUA). This EUA will remain in effect (meaning this test can be used) for the duration of the COVID-19 declaration under Section 564(b)(1) of the Act, 21 U.S.C. section 360bbb-3(b)(1), unless the authorization is terminated or revoked.  Performed at [facility], [address], [place], [place] [address]    Lactic Acid, Venous 08/30/2020 1.0  0.5 - 1.9 mmol/L Final   Performed at [facility], [address], [place], [place] [address]   Color, Urine 08/30/2020 YELLOW  YELLOW Final   APPearance 08/30/2020 CLEAR  CLEAR Final   Specific Gravity, Urine 08/30/2020 1.024  1.005 - 1.030 Final   pH 08/30/2020 5.0  5.0 - 8.0 Final   Glucose, UA 08/30/2020 >=500 (A)  NEGATIVE mg/dL Final   Hgb urine dipstick 08/30/2020 NEGATIVE  NEGATIVE Final   Bilirubin Urine 08/30/2020 NEGATIVE  NEGATIVE Final   Ketones, ur 08/30/2020 NEGATIVE  NEGATIVE mg/dL Final  Protein, ur 08/30/2020 NEGATIVE  NEGATIVE mg/dL Final   Nitrite 08/30/2020 NEGATIVE  NEGATIVE Final   Leukocytes,Ua 08/30/2020 NEGATIVE  NEGATIVE Final   WBC, UA 08/30/2020 0-5  0 - 5 WBC/hpf Final   Bacteria, UA 08/30/2020 NONE SEEN  NONE SEEN Final   Performed at [facility], [place] [address], [place], [place] [address]   Opiates 08/30/2020 NONE DETECTED  NONE DETECTED Final   Cocaine 08/30/2020 NONE DETECTED  NONE DETECTED Final   Benzodiazepines 08/30/2020 NONE DETECTED  NONE DETECTED Final   Amphetamines 08/30/2020 NONE DETECTED  NONE DETECTED Final   Tetrahydrocannabinol 08/30/2020 NONE DETECTED  NONE DETECTED Final    Barbiturates 08/30/2020 NONE DETECTED  NONE DETECTED Final   Comment: (NOTE) DRUG SCREEN FOR MEDICAL PURPOSES ONLY.  IF CONFIRMATION IS NEEDED FOR ANY PURPOSE, NOTIFY LAB WITHIN 5 DAYS.  LOWEST DETECTABLE LIMITS FOR URINE DRUG SCREEN Drug Class                     Cutoff (ng/mL) Amphetamine and metabolites    1000 Barbiturate and metabolites    200 Benzodiazepine                 604 Tricyclics and metabolites     300 Opiates and metabolites        300 Cocaine and metabolites        300 THC                            50 Performed at [facility], [name] [address] [place], [place] [address]     Blood Alcohol level:  Lab Results  Component Value Date   [facility] <10 01/04/2021   ETH <10 11/91/4782    Metabolic Disorder Labs: Lab Results  Component Value Date   HGBA1C 6.8 (H) 12/10/2020   MPG 148.46 12/10/2020   MPG 188.64 10/10/2020   No results found for: PROLACTIN Lab Results  Component Value Date   CHOL 177 12/10/2020   TRIG 124 12/10/2020   HDL 50 12/10/2020   CHOLHDL 3.5 12/10/2020   VLDL 25 12/10/2020   LDLCALC 102 (H) 12/10/2020   LDLCALC 82 10/10/2020    Therapeutic Lab Levels: No results found for: LITHIUM Lab Results  Component Value Date   VALPROATE <10.0 (L) 11/03/2012   VALPROATE 80.7 03/27/2012   No components found for:  CBMZ  Physical Findings   AIMS    Flowsheet Row Admission (Discharged) from 10/09/2016 in [facility]  AIMS Total Score 0      AUDIT    Flowsheet Row Admission (Discharged) from 12/08/2020 in [facility] Admission (Discharged) from 10/09/2016 in [facility] Admission (Discharged) from 07/23/2013 in [facility] Admission (Discharged) from 04/02/2013 in [facility] Admission (Discharged) from 11/03/2012 in [facility]  Alcohol  Use Disorder Identification Test Final Score (AUDIT) 5 0 4 0 2      PHQ2-9    Flowsheet Row ED from 01/04/2021 in [facility]  PHQ-2 Total Score 4  PHQ-9 Total Score 12      Flowsheet Row ED from 01/05/2021 in [facility] ED from 01/04/2021 in [facility] Admission (Discharged) from 12/08/2020 in [facility]  C-SSRS RISK CATEGORY High  Risk High Risk High Risk        Musculoskeletal  Strength & Muscle Tone: within normal limits Gait & Station: normal Patient leans: N/A  Psychiatric Specialty Exam  Presentation  General Appearance: Appropriate for Environment  Eye Contact:Good  Speech:Clear and Coherent; Normal Rate  Speech Volume:Normal  Handedness:Right   Mood and Affect  Mood:Anxious; Dysphoric  Affect:Congruent; Appropriate   Thought Process  Thought Processes:Coherent; Goal Directed  Descriptions of Associations:Intact  Orientation:Full (Time, Place and Person)  Thought Content:WDL  Diagnosis of Schizophrenia or Schizoaffective disorder in past: No    Hallucinations:Hallucinations: None  Ideas of Reference:None  Suicidal Thoughts:Suicidal Thoughts: Yes, Passive SI Active Intent and/or Plan: Without Intent SI Passive Intent and/or Plan: Without Intent; With Plan  Homicidal Thoughts:Homicidal Thoughts: No HI Passive Intent and/or Plan: Without Intent; Without Plan   Sensorium  Memory:Immediate Good; Recent Good; Remote Good  Judgment:Intact  Insight:Present   Executive Functions  Concentration:Good  Attention Span:Good  [place] of Knowledge:Good  Language:Good   Psychomotor Activity  Psychomotor Activity:Psychomotor Activity: Normal   Assets  Assets:Communication Skills; Desire for Improvement; Financial Resources/Insurance; Housing; Resilience; Social Support   Sleep   Sleep:Sleep: Good Number of Hours of Sleep: 8   Nutritional Assessment (For OBS and FBC admissions only) Has the patient had a weight loss or gain of 10 pounds or more in the last 3 months?: No Has the patient had a decrease in food intake/or appetite?: No Does the patient have dental problems?: No Does the patient have eating habits or behaviors that may be indicators of an eating disorder including binging or inducing vomiting?: No Has the patient recently lost weight without trying?: 0 Has the patient been eating poorly because of a decreased appetite?: 0 Malnutrition Screening Tool Score: 0    Physical Exam  Physical Exam Constitutional:      General: He is not in acute distress.    Appearance: He is not ill-appearing, toxic-appearing or diaphoretic.  HENT:     Right Ear: External ear normal.     Left Ear: External ear normal.  Cardiovascular:     Rate and Rhythm: Normal rate.  Pulmonary:     Effort: Pulmonary effort is normal. No respiratory distress.  Musculoskeletal:        General: Normal range of motion.  Skin:    General: Skin is warm and dry.  Neurological:     Mental Status: He is alert and oriented to person, place, and time.  Psychiatric:        Mood and Affect: Mood is anxious and depressed.        Speech: Speech normal.        Behavior: Behavior is cooperative.        Thought Content: Thought content is not paranoid or delusional. Thought content does not include homicidal (Denies) ideation. Suicidal: Reporting more passive suicidal ideation related to getting treatment for substance use disorder.Thought content does not include homicidal plan.        Cognition and Memory: Cognition and memory normal.        Judgment: Judgment is impulsive.   Review of Systems  Constitutional:  Negative for chills, diaphoresis, fever, malaise/fatigue and weight loss.  HENT:  Negative for congestion.   Respiratory:  Negative for cough and shortness of breath.    Cardiovascular:  Negative for chest pain and palpitations.  Gastrointestinal:  Negative for diarrhea, nausea and vomiting.  Neurological:  Negative for dizziness and seizures.  Psychiatric/Behavioral:  Positive for  depression and substance abuse. Negative for hallucinations (Denies) and memory loss. Suicidal ideas: Reporting passive suicidal thoughts.  Stating he needs to get help for his drug use; if not no point in continuing.The patient is nervous/anxious. Insomnia: Improving; slept well last night.  All other systems reviewed and are negative. Blood pressure 134/75, pulse 76, temperature 98.5 F (36.9 C), resp. rate 18, SpO2 100 %. There is no height or weight on file to calculate BMI.  Treatment Plan Summary: Daily contact with patient to assess and evaluate symptoms and progress in treatment, Medication management, and Plan Social work to continue working with patient for referral to rehabilitation facilities for substance disorder.  If unable to find available bed at rehab facility may need to admit to facility based crisis unit for safety and stabilization related to passive suicidal ideation, unable to contract for safety unless he is able to get into a facility based rehab facility  Earleen [place], NP 01/06/2021 10:32 AM

## 2021-01-07 ENCOUNTER — Encounter (HOSPITAL_COMMUNITY): Payer: Self-pay

## 2021-01-07 DIAGNOSIS — F314 Bipolar disorder, current episode depressed, severe, without psychotic features: Secondary | ICD-10-CM | POA: Diagnosis not present

## 2021-01-07 LAB — BASIC METABOLIC PANEL
Anion gap: 4 — ABNORMAL LOW (ref 5–15)
BUN: 15 mg/dL (ref 8–23)
CO2: 28 mmol/L (ref 22–32)
Calcium: 8.9 mg/dL (ref 8.9–10.3)
Chloride: 105 mmol/L (ref 98–111)
Creatinine, Ser: 0.87 mg/dL (ref 0.61–1.24)
GFR, Estimated: >60 mL/min (ref >60)
Glucose, Bld: 178 mg/dL — ABNORMAL HIGH (ref 70–99)
Potassium: 4.3 mmol/L (ref 3.5–5.1)
Sodium: 137 mmol/L (ref 135–145)

## 2021-01-07 LAB — GLUCOSE, CAPILLARY: Glucose-Capillary: 180 mg/dL — ABNORMAL HIGH (ref 70–99)

## 2021-01-07 MED ORDER — GABAPENTIN 300 MG PO CAPS
300.0000 mg | ORAL_CAPSULE | Freq: Three times a day (TID) | ORAL | Status: DC
  Administered 2021-01-07 – 2021-01-12 (×15): 300 mg via ORAL
  Filled 2021-01-07 (×22): qty 1

## 2021-01-07 MED ORDER — ARIPIPRAZOLE 10 MG PO TABS
10.0000 mg | ORAL_TABLET | Freq: Every day | ORAL | Status: DC
  Administered 2021-01-08 – 2021-01-12 (×5): 10 mg via ORAL
  Filled 2021-01-07 (×7): qty 1

## 2021-01-07 MED ORDER — DULOXETINE HCL 30 MG PO CPEP
30.0000 mg | ORAL_CAPSULE | Freq: Every day | ORAL | Status: DC
  Administered 2021-01-08 – 2021-01-12 (×5): 30 mg via ORAL
  Filled 2021-01-07 (×7): qty 1

## 2021-01-07 NOTE — BH IP Treatment Plan (Signed)
Interdisciplinary Treatment and Diagnostic Plan Update  01/07/2021 Time of Session: 9:00am  [name] MRN: [id number]  Principal Diagnosis: Bipolar affective disorder, depressed, severe ([place])  Secondary Diagnoses: Principal Problem:   Bipolar affective disorder, depressed, severe ([place]) Active Problems:   HLD (hyperlipidemia)   Essential hypertension   Diabetes type 2, controlled ([place])   Coronary artery disease   Cocaine abuse ([place])   Current Medications:  Current Facility-Administered Medications  Medication Dose Route Frequency Provider Last Rate Last Admin   acetaminophen (TYLENOL) tablet 650 mg  650 mg Oral Q6H PRN Lovena Le, Cody W, PA-C       albuterol (VENTOLIN HFA) 108 (90 Base) MCG/ACT inhaler 1-2 puff  1-2 puff Inhalation Q6H PRN Rankin, Shuvon B, NP       alum & mag hydroxide-simeth (MAALOX/MYLANTA) 200-200-20 MG/5ML suspension 30 mL  30 mL Oral Q4H PRN Lovena Le, Cody W, PA-C       [START ON 01/08/2021] ARIPiprazole (ABILIFY) tablet 10 mg  10 mg Oral Daily Lindell Spar I, NP       aspirin EC tablet 81 mg  81 mg Oral Daily Rankin, Shuvon B, NP   81 mg at 01/07/21 0900   clopidogrel (PLAVIX) tablet 75 mg  75 mg Oral Daily Rankin, Shuvon B, NP   75 mg at 01/07/21 0900   [START ON 01/08/2021] DULoxetine (CYMBALTA) DR capsule 30 mg  30 mg Oral Daily Nwoko, Herbert Pun I, NP       gabapentin (NEURONTIN) capsule 300 mg  300 mg Oral TID Lindell Spar I, NP       hydrOXYzine (ATARAX/VISTARIL) tablet 25 mg  25 mg Oral TID PRN [place]illa Sours, PA-C   25 mg at 01/06/21 2109   magnesium hydroxide (MILK OF MAGNESIA) suspension 30 mL  30 mL Oral Daily PRN Lovena Le, Cody W, PA-C       metFORMIN (GLUCOPHAGE-XR) 24 hr tablet 1,000 mg  1,000 mg Oral Q supper Rankin, Shuvon B, NP       metFORMIN (GLUCOPHAGE-XR) 24 hr tablet 500 mg  500 mg Oral Q breakfast Rankin, Shuvon B, NP   500 mg at 01/07/21 0900   metoprolol succinate (TOPROL-XL) 24 hr tablet 25 mg  25 mg Oral Daily Rankin, Shuvon B, NP   25 mg  at 01/07/21 0900   multivitamin with minerals tablet 1 tablet  1 tablet Oral Daily PRN Rankin, Shuvon B, NP       nicotine polacrilex (NICORETTE) gum 2 mg  2 mg Oral PRN Rankin, Shuvon B, NP       pantoprazole (PROTONIX) EC tablet 40 mg  40 mg Oral Daily Rankin, Shuvon B, NP   40 mg at 01/07/21 0900   rosuvastatin (CRESTOR) tablet 40 mg  40 mg Oral Daily Rankin, Shuvon B, NP   40 mg at 01/07/21 0900   traZODone (DESYREL) tablet 50 mg  50 mg Oral QHS PRN Rankin, Shuvon B, NP   50 mg at 01/06/21 2109   PTA Medications: Medications Prior to Admission  Medication Sig Dispense Refill Last Dose   albuterol (VENTOLIN HFA) 108 (90 Base) MCG/ACT inhaler Inhale 1-2 puffs into the lungs every 6 (six) hours as needed for wheezing or shortness of breath. 1 each 0    ARIPiprazole (ABILIFY) 10 MG tablet Take 5 mg by mouth daily.      aspirin EC 81 MG tablet Take 81 mg by mouth daily. Swallow whole.      clopidogrel (PLAVIX) 75 MG tablet Take 1 tablet (75  mg total) by mouth daily. 30 tablet 0    diclofenac Sodium (VOLTAREN) 1 % GEL Apply 2 g topically 4 (four) times daily as needed (to affected sites- for pain).      DULoxetine (CYMBALTA) 20 MG capsule Take 1 capsule (20 mg total) by mouth daily. 30 capsule 0    empagliflozin (JARDIANCE) 25 MG TABS tablet Take 12.5 mg by mouth daily.      gabapentin (NEURONTIN) 100 MG capsule Take 2 capsules (200 mg total) by mouth 3 (three) times daily. (Patient taking differently: Take 200 mg by mouth 3 (three) times daily as needed (AS DIRECTED).) 180 capsule 0    lamoTRIgine (LAMICTAL) 25 MG tablet Take 1 tablet (25 mg total) by mouth daily. 30 tablet 0    metFORMIN (FORTAMET) 1000 MG (OSM) 24 hr tablet Take 1 tablet (1,000 mg total) by mouth daily with supper. (Patient not taking: Reported on 01/05/2021) 30 tablet 0    metFORMIN (GLUCOPHAGE-XR) 500 MG 24 hr tablet Take 1 tablet (500 mg total) by mouth daily with breakfast. (Patient taking differently: Take 500 mg by mouth 2  (two) times a week.) 30 tablet 0    metoprolol succinate (TOPROL-XL) 25 MG 24 hr tablet Take 1 tablet (25 mg total) by mouth daily. 30 tablet 0    Multiple Vitamin (MULTIVITAMIN WITH MINERALS) TABS tablet Take 1 tablet by mouth daily as needed (for supplementation).      nicotine polacrilex (NICORETTE) 2 MG gum Take 1 each (2 mg total) by mouth as needed for smoking cessation. 100 tablet 0    omeprazole (PRILOSEC) 20 MG capsule Take 20 mg by mouth daily before breakfast.      pantoprazole (PROTONIX) 40 MG tablet Take 1 tablet (40 mg total) by mouth daily. (Patient not taking: Reported on 01/05/2021) 30 tablet 1    polyethylene glycol powder (GLYCOLAX/MIRALAX) 17 GM/SCOOP powder Take 17 g by mouth daily as needed for mild constipation (MIX AND DRINK AS DIRECTED).      rosuvastatin (CRESTOR) 40 MG tablet Take 40 mg by mouth daily. (Patient not taking: Reported on 01/05/2021)      traZODone (DESYREL) 50 MG tablet Take 50 mg by mouth at bedtime as needed for sleep.       Patient Stressors:    Patient Strengths: Ability for insight  Active sense of humor  Physical Health   Treatment Modalities: Medication Management, Group therapy, Case management,  1 to 1 session with clinician, Psychoeducation, Recreational therapy.   Physician Treatment Plan for Primary Diagnosis: Bipolar affective disorder, depressed, severe ([place]) Long Term Goal(s): Improvement in symptoms so as ready for discharge   Short Term Goals: Ability to identify and develop effective coping behaviors will improve Ability to maintain clinical measurements within normal limits will improve Ability to identify triggers associated with substance abuse/mental health issues will improve Ability to identify changes in lifestyle to reduce recurrence of condition will improve Ability to verbalize feelings will improve Ability to disclose and discuss suicidal ideas Ability to demonstrate self-control will improve  Medication Management:  Evaluate patient's response, side effects, and tolerance of medication regimen.  Therapeutic Interventions: 1 to 1 sessions, Unit Group sessions and Medication administration.  Evaluation of Outcomes: Not Met  Physician Treatment Plan for Secondary Diagnosis: Principal Problem:   Bipolar affective disorder, depressed, severe ([place]) Active Problems:   HLD (hyperlipidemia)   Essential hypertension   Diabetes type 2, controlled ([place])   Coronary artery disease   Cocaine abuse ([place])  Long Term  Goal(s): Improvement in symptoms so as ready for discharge   Short Term Goals: Ability to identify and develop effective coping behaviors will improve Ability to maintain clinical measurements within normal limits will improve Ability to identify triggers associated with substance abuse/mental health issues will improve Ability to identify changes in lifestyle to reduce recurrence of condition will improve Ability to verbalize feelings will improve Ability to disclose and discuss suicidal ideas Ability to demonstrate self-control will improve     Medication Management: Evaluate patient's response, side effects, and tolerance of medication regimen.  Therapeutic Interventions: 1 to 1 sessions, Unit Group sessions and Medication administration.  Evaluation of Outcomes: Not Met   RN Treatment Plan for Primary Diagnosis: Bipolar affective disorder, depressed, severe ([place]) Long Term Goal(s): Knowledge of disease and therapeutic regimen to maintain health will improve  Short Term Goals: Ability to remain free from injury will improve, Ability to participate in decision making will improve, Ability to verbalize feelings will improve, Ability to disclose and discuss suicidal ideas, and Ability to identify and develop effective coping behaviors will improve  Medication Management: RN will administer medications as ordered by provider, will assess and evaluate patient's response and provide education to  patient for prescribed medication. RN will report any adverse and/or side effects to prescribing provider.  Therapeutic Interventions: 1 on 1 counseling sessions, Psychoeducation, Medication administration, Evaluate responses to treatment, Monitor vital signs and CBGs as ordered, Perform/monitor CIWA, COWS, AIMS and Fall Risk screenings as ordered, Perform wound care treatments as ordered.  Evaluation of Outcomes: Not Met   LCSW Treatment Plan for Primary Diagnosis: Bipolar affective disorder, depressed, severe ([place]) Long Term Goal(s): Safe transition to appropriate next level of care at discharge, Engage patient in therapeutic group addressing interpersonal concerns.  Short Term Goals: Engage patient in aftercare planning with referrals and resources, Increase social support, Increase emotional regulation, Facilitate acceptance of mental health diagnosis and concerns, Identify triggers associated with mental health/substance abuse issues, and Increase skills for wellness and recovery  Therapeutic Interventions: Assess for all discharge needs, 1 to 1 time with Social worker, Explore available resources and support systems, Assess for adequacy in community support network, Educate family and significant other(s) on suicide prevention, Complete Psychosocial Assessment, Interpersonal group therapy.  Evaluation of Outcomes: Not Met   Progress in Treatment: Attending groups: Yes. Participating in groups: Yes. Taking medication as prescribed: Yes. Toleration medication: Yes. Family/Significant other contact made: No, will contact:  [place]  Patient understands diagnosis: Yes. Discussing patient identified problems/goals with staff: Yes. Medical problems stabilized or resolved: Yes. Denies suicidal/homicidal ideation: Yes. Issues/concerns per patient self-inventory: No.   New problem(s) identified: No, Describe:  None   New Short Term/Long Term Goal(s): medication stabilization,  elimination of SI thoughts, development of comprehensive mental wellness plan.   Patient Goals: " To think positive, be assertive, and be cooperative"   Discharge Plan or Barriers: Patient recently admitted. CSW will continue to follow and assess for appropriate referrals and possible discharge planning.   Reason for Continuation of Hospitalization: Depression Medication stabilization Suicidal ideation Withdrawal symptoms  Estimated Length of Stay: 3 to 5 days    Scribe for Treatment Team: Darleen Crocker, Latanya Presser 01/07/2021 1:57 PM

## 2021-01-07 NOTE — BHH Group Notes (Addendum)
[place] Group Notes:  (Nursing/MHT/Case Management/Adjunct)  Date:  01/07/2021  Time:  8:39 PM  Type of Therapy:   NA Group  Participation Level:  Minimal  Participation Quality:  Appropriate and Drowsy  Affect:  Appropriate  Cognitive:  Appropriate  Insight:  Appropriate, Good, and Improving  Engagement in Group:  Developing/Improving and Improving  Modes of Intervention:  Discussion  Summary of Progress/Problems: PT was in a good mood. Was very drowsy and kept dosing off but was appropriate.  Maxine Glenn 01/07/2021, 8:39 PM

## 2021-01-07 NOTE — BHH Suicide Risk Assessment (Signed)
[name] INPATIENT:  Family/Significant Other Suicide Prevention Education  Suicide Prevention Education:  Patient Refusal for Family/Significant Other Suicide Prevention Education: The patient [name] has refused to provide written consent for family/significant other to be provided Family/Significant Other Suicide Prevention Education during admission and/or prior to discharge.  Physician notified.  Mliss Fritz 01/07/2021, 10:54 AM

## 2021-01-07 NOTE — Plan of Care (Signed)
Nurse discussed anxiety, depression and coping skills with patient.  

## 2021-01-07 NOTE — Progress Notes (Signed)
D:  Patient's self inventory sheet, patient sleeps good, sleep medication helpful.  Good appetite, normal energy level, good concentration.  Rated depression 4, hopeless and anxiety 3.  Withdrawals, chilling, runny nose, irritability.  Denied SI.  Then checked "sometimes".  Denied physical problems.  Physical pain, pain #6 in past 24 hours, knees.  Pain medicine helpful.  Goal is think positive.  A:  Medications administered per MD orders.  Emotional support and encouragement given patient. R:  Patient denied SI and HI, contracts for safety.  Denied A/V hallucinations.  Safety maintained with 15 minute checks.

## 2021-01-07 NOTE — Group Note (Signed)
LCSW Group Therapy Note   Group Date: 01/07/2021 Start Time: 1300 End Time: 1400  Type of Therapy and Topic:  Group Therapy: Worry    Participation Level: Did Not Attend   Description of Group:   In this group, patients learned how to define worry. Patients were asked to identify a time they felt anxiety or something that triggers their anxiety. Patients engaged in a matching interactive activity matching bugs, with each match facts about worry was shared. Patients and CSW engaged in discussion surrounding each fact / definition shared. Patients were then asked to explore positive coping mechanisms for worry and discussed things like unrealistic worry, things out of our control and times that they worried about something but actually ended up enjoying it (I.e. Learning to ride a bike). Patients discussed several new ways to handle worry such as music, journaling, thinking about positive endings instead of negative, drawing, happy place, relaxation skills (deep breathing, progressive muscle relaxation and meditation) and engaging in a hobby. CSW asked patients to commit to trying a positive coping skill when faced with worry in the future.   Therapeutic Goals: Patients will recall a time they felt worried or identify something they worry about often. Patients will learn how to define worry.  Patients will learn that some worry cannot be erased, as some things are out of out control and at times our worries are not likely to happen.  Patients will be asked to do some self-reflection with their own worry (throughout the matching activity). Patients will be asked to practice impulse control with the matching game and imagery with the happy place during this group.   Patients were asked to share ways they can cope with anxiety.       Summary of Patient Progress:  Did Not Attend     Therapeutic Modalities:   Cognitive Behavioral Therapy Motivational Interviewing  Brief Therapy   Eliott Nine 01/07/2021  1:22 PM

## 2021-01-07 NOTE — Plan of Care (Signed)
  Problem: Coping: Goal: Ability to identify and develop effective coping behavior will improve Outcome: Not Progressing   Problem: Self-Concept: Goal: Level of anxiety will decrease Outcome: Not Progressing   Problem: Coping: Goal: Coping ability will improve Outcome: Not Progressing
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
## 2021-01-07 NOTE — BHH Group Notes (Signed)
Adult Psychoeducational Group Note  Date:  01/07/2021 Time:  4:35 PM  Group Topic/Focus:  Relaxation  Participation Level:  Did Not Attend   Dub Mikes 01/07/2021, 4:35 PM

## 2021-01-07 NOTE — Progress Notes (Signed)
     01/06/21 2050  Vital Signs  Pulse Rate 70  BP (!) 148/84  BP Location Right Arm  BP Method Automatic  Patient Position (if appropriate) Sitting     01/06/21 2052  Vital Signs  Pulse Rate 92  BP (!) 163/94  BP Location Right Arm  BP Method Automatic  Patient Position (if appropriate) Standing    BP is elevated.  Provider notified. Patient has complaints of high anxiety and requests medication for sleep. Administered PRN Vistaril and Trazodone per [facility] per pt request.  Provider aware.
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
## 2021-01-07 NOTE — Progress Notes (Signed)
   On assessment, patient presents with high anxiety and depression.  Pt states, "BP is high because I am withdrawing from alcohol and cocaine; I'm de-toxing."  Provider notified of elevated BP.  Administered PRN Vistaril for anxiety and PRN Trazodone for help with sleep, per [facility], per pt request.  Pt denies SI/HI, and verbally contracts for safety.  Pt denies AVH.  Patient remains safe on unit with Q 15 minute safety checks.    01/06/21 2109  Psych Admission Type (Psych Patients Only)  Admission Status Voluntary  Psychosocial Assessment  Patient Complaints Anxiety;Depression;Substance abuse  Eye Contact Fair  Facial Expression Sad  Affect Depressed;Sad  Speech Logical/coherent  Interaction Guarded  Motor Activity Slow  Appearance/Hygiene Unremarkable  Behavior Characteristics Cooperative;Anxious  Mood Pleasant;Depressed  Thought Process  Coherency WDL  Content WDL  Delusions WDL  Perception WDL  Hallucination None reported or observed  Judgment Impaired  Confusion WDL  Danger to Self  Current suicidal ideation? Denies  Self-Injurious Behavior No self-injurious ideation or behavior indicators observed or expressed   Agreement Not to Harm Self Yes  Description of Agreement Verbal contract for safety  Danger to Others  Danger to Others None reported or observed

## 2021-01-07 NOTE — BHH Counselor (Signed)
CSW informed pt that he needs to call his social worker at the [facility] and request a Mental health consult be placed for community care through [facility] in order to move onto the next step in the process for [employer].  Toney Reil, [place] Worker [employer]

## 2021-01-07 NOTE — H&P (Signed)
Psychiatric Admission Assessment Adult  Patient Identification: [name]  MRN:  [id number]  Date of Evaluation:  01/07/2021  Chief Complaint: Worsening suicidal ideations with plans to shoot himself in the head.  Principal Diagnosis: Bipolar affective disorder, depressed, severe ([place])  Diagnosis:  Principal Problem:   Bipolar affective disorder, depressed, severe ([place])  History of Present Illness: This is one of several psychiatric admissions in this [facility] for this 65 year old AA male with hx of mental health issues, cocaine/THC use disorders, multiple psychiatric admissions & noncompliant to his mental health treatment recommendations. [name] was a recent patient in this [facility] from October 24th thru11-01-22 for calling the police reporting suicidal ideations & thoughts of self harm. After treatment/mood stabilizations at the time, he was discharged with a follow-up recommendations at the [place] in [place], [place]. This time around, [name] is admitted to the [facility] from the [facility] hospital with complain of suicidal ideations with plans to shoot himself on the head. He does have hx of other chronic medical issues that includes, DM, HTN, GERD & s/p stent placement last August, 2022. After medical evaluation/clearance, he was brought to the [facility] & being recommended for mood stabilization treatments. During this evaluation, [name] was seen with the attending psychiatrist Dr. Nelda Marseille. He was a bit uncooperative & upset. His focus was on his money as he states he does not need anyone to help him manage his money as he is capable of doing as such himself. Patient received monthly SSI checks & other VA benefits. He is also homeless & spends his money on cocaine/other related drugs. The social worker has informed the patient that an attempt is being made to contact the [place] to assist in arranging for a payee to help patient manage his monthly checks after this hospitalization moving forward. This was  what got [name] wound up & upset. He finally got himself together to report a brief reasons why he came back to the hospital soon after getting discharged. He reports,   "I did not really take my medicines after I got discharged from this hospital 3 weeks ago. I relapsed on cocaine right away after I left here. I also did not [facility] with the [facility] as I was suppose to do. But, the [facility] did contact me soon after discharge telling me that they were setting up a residential treatment program for me to attend. But I never did hear from them again. I will make a deal with you guys, I will cooperate, take the medicines, go to the group sessions, then get discharged. I'm not feeling suicidal, homicidal, hearing voices or seeing things others are unable to hear or see. I'm not delusional or feeling paranoid. I'm okay. I do not think that I need a payee".  Associated Signs/Symptoms:  Depression Symptoms:  depressed mood, feelings of worthlessness/guilt, hopelessness,  Duration of Depression Symptoms: Greater than two weeks  (Hypo) Manic Symptoms:  Labiality of Mood,  Anxiety Symptoms:  Excessive Worry,  Psychotic Symptoms:   currently denies any hallucinations, delusional thouhing or paranoia.  PTSD Symptoms: NA  Total Time spent with patient: 1 hour  Past Psychiatric History: Bipolar affective disorder.  Is the patient at risk to self? No.  Has the patient been a risk to self in the past 6 months? Yes.    Has the patient been a risk to self within the distant past? Yes.    Is the patient a risk to others? No.  Has the patient been a risk to  others in the past 6 months? No.  Has the patient been a risk to others within the distant past? No.   Prior Inpatient Therapy: [facility] x multiple times. [facility]  Alcohol Screening: Denies any alcohol use.  Substance Abuse History in the last 12 months:  Yes.    Consequences of Substance Abuse: Discussed witg patient during  this admission evaluation. Medical Consequences:  Liver damage, Possible death by overdose Legal Consequences:  Arrests, jail time, Loss of driving privilege. Family Consequences:  Family discord, divorce and or separation.   Previous Psychotropic Medications: No   Psychological Evaluations: No   Past Medical History:  Past Medical History:  Diagnosis Date   Anxiety    Arthritis    Cancer ([place])    Cataract    Depression    Diabetes mellitus    GERD (gastroesophageal reflux disease)    H/O suicide attempt 1997 and 2011   jumped out of moving vehicle and attempted to jump into moving traffic   High cholesterol    Hypertension    Neuropathic pain    Osteoporosis     Past Surgical History:  Procedure Laterality Date   COLONOSCOPY     CORONARY STENT INTERVENTION N/A 10/10/2020   Procedure: CORONARY STENT INTERVENTION;  Surgeon: Jettie Booze, MD;  Location: [place] [facility];  Service: Cardiovascular;  Laterality: N/A;   LEFT HEART CATH AND CORONARY ANGIOGRAPHY N/A 10/10/2020   Procedure: LEFT HEART CATH AND CORONARY ANGIOGRAPHY;  Surgeon: Jettie Booze, MD;  Location: [place] [facility];  Service: Cardiovascular;  Laterality: N/A;   NEPHRECTOMY     due to cancer   RIGHT/LEFT HEART CATH AND CORONARY ANGIOGRAPHY N/A 10/09/2020   Procedure: RIGHT/LEFT HEART CATH AND CORONARY ANGIOGRAPHY;  Surgeon: Nelva Bush, MD;  Location: [place] [facility];  Service: Cardiovascular;  Laterality: N/A;   Family History:  Family History  Problem Relation Age of Onset   Alcohol abuse Sister    Alcohol abuse Brother    Colon cancer Neg Hx    Esophageal cancer Neg Hx    Pancreatic cancer Neg Hx    Rectal cancer Neg Hx    Stomach cancer Neg Hx    Family Psychiatric  History: Schizophrenia: Maternal aunt (completed suicide).                                                  Alcoholism: Sister.                                                   Opioid addiction: Sister.                                                                   Tobacco Screening: Smokes 1/2 pack cigarettes daily.  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 Noncombat service. Has been homeless for 30 years, but has  worked Horticulturist, commercial jobs. Social History   Substance and Sexual Activity  Alcohol Use Yes   Alcohol/week: 3.0 standard drinks   Types: 3 Cans of beer per week   Comment: Pt has been clean for 8 months     Social History   Substance and Sexual Activity  Drug Use Yes   Types: "Crack" cocaine, Cocaine    Additional Social History:  Allergies:  No Active Allergies  Lab Results:  Results for orders placed or performed during the hospital encounter of 01/06/21 (from the past 48 hour(s))  Glucose, capillary     Status: Abnormal   Collection Time: 01/07/21  6:36 AM  Result Value Ref Range   Glucose-Capillary 180 (H) 70 - 99 mg/dL    Comment: Glucose reference range applies only to samples taken after fasting for at least 8 hours.   Blood Alcohol level:  Lab Results  Component Value Date   ETH <10 01/04/2021   ETH <10 83/41/9622   Metabolic Disorder Labs:  Lab Results  Component Value Date   HGBA1C 6.8 (H) 12/10/2020   MPG 148.46 12/10/2020   MPG 188.64 10/10/2020   No results found for: PROLACTIN Lab Results  Component Value Date   CHOL 177 12/10/2020   TRIG 124 12/10/2020   HDL 50 12/10/2020   CHOLHDL 3.5 12/10/2020   VLDL 25 12/10/2020   LDLCALC 102 (H) 12/10/2020   LDLCALC 82 10/10/2020   Current Medications: Current Facility-Administered Medications  Medication Dose Route Frequency Provider Last Rate Last Admin   acetaminophen (TYLENOL) tablet 650 mg  650 mg Oral Q6H PRN Lovena Le, Cody W, PA-C       albuterol (VENTOLIN HFA) 108 (90 Base) MCG/ACT inhaler 1-2 puff  1-2 puff Inhalation Q6H PRN Rankin, Shuvon B, NP       alum & mag hydroxide-simeth  (MAALOX/MYLANTA) 200-200-20 MG/5ML suspension 30 mL  30 mL Oral Q4H PRN Lovena Le, Cody W, PA-C       ARIPiprazole (ABILIFY) tablet 5 mg  5 mg Oral Daily Rankin, Shuvon B, NP   5 mg at 01/07/21 0900   aspirin EC tablet 81 mg  81 mg Oral Daily Rankin, Shuvon B, NP   81 mg at 01/07/21 0900   clopidogrel (PLAVIX) tablet 75 mg  75 mg Oral Daily Rankin, Shuvon B, NP   75 mg at 01/07/21 0900   DULoxetine (CYMBALTA) DR capsule 20 mg  20 mg Oral Daily Rankin, Shuvon B, NP   20 mg at 01/07/21 0900   gabapentin (NEURONTIN) capsule 200 mg  200 mg Oral TID Rankin, Shuvon B, NP   200 mg at 01/07/21 1206   hydrOXYzine (ATARAX/VISTARIL) tablet 25 mg  25 mg Oral TID PRN Prescilla Sours, PA-C   25 mg at 01/06/21 2109   lamoTRIgine (LAMICTAL) tablet 25 mg  25 mg Oral Daily Rankin, Shuvon B, NP   25 mg at 01/07/21 0900   magnesium hydroxide (MILK OF MAGNESIA) suspension 30 mL  30 mL Oral Daily PRN Lovena Le, Cody W, PA-C       metFORMIN (GLUCOPHAGE-XR) 24 hr tablet 1,000 mg  1,000 mg Oral Q supper Rankin, Shuvon B, NP       metFORMIN (GLUCOPHAGE-XR) 24 hr tablet 500 mg  500 mg Oral Q breakfast Rankin, Shuvon B, NP   500 mg at 01/07/21 0900   metoprolol succinate (TOPROL-XL) 24 hr tablet 25 mg  25 mg Oral Daily Rankin, Shuvon B, NP   25 mg at 01/07/21 0900  multivitamin with minerals tablet 1 tablet  1 tablet Oral Daily PRN Rankin, Shuvon B, NP       nicotine polacrilex (NICORETTE) gum 2 mg  2 mg Oral PRN Rankin, Shuvon B, NP       pantoprazole (PROTONIX) EC tablet 40 mg  40 mg Oral Daily Rankin, Shuvon B, NP   40 mg at 01/07/21 0900   rosuvastatin (CRESTOR) tablet 40 mg  40 mg Oral Daily Rankin, Shuvon B, NP   40 mg at 01/07/21 0900   traZODone (DESYREL) tablet 50 mg  50 mg Oral QHS PRN Rankin, Shuvon B, NP   50 mg at 01/06/21 2109   PTA Medications: Medications Prior to Admission  Medication Sig Dispense Refill Last Dose   albuterol (VENTOLIN HFA) 108 (90 Base) MCG/ACT inhaler Inhale 1-2 puffs into the lungs every 6  (six) hours as needed for wheezing or shortness of breath. 1 each 0    ARIPiprazole (ABILIFY) 10 MG tablet Take 5 mg by mouth daily.      aspirin EC 81 MG tablet Take 81 mg by mouth daily. Swallow whole.      clopidogrel (PLAVIX) 75 MG tablet Take 1 tablet (75 mg total) by mouth daily. 30 tablet 0    diclofenac Sodium (VOLTAREN) 1 % GEL Apply 2 g topically 4 (four) times daily as needed (to affected sites- for pain).      DULoxetine (CYMBALTA) 20 MG capsule Take 1 capsule (20 mg total) by mouth daily. 30 capsule 0    empagliflozin (JARDIANCE) 25 MG TABS tablet Take 12.5 mg by mouth daily.      gabapentin (NEURONTIN) 100 MG capsule Take 2 capsules (200 mg total) by mouth 3 (three) times daily. (Patient taking differently: Take 200 mg by mouth 3 (three) times daily as needed (AS DIRECTED).) 180 capsule 0    lamoTRIgine (LAMICTAL) 25 MG tablet Take 1 tablet (25 mg total) by mouth daily. 30 tablet 0    metFORMIN (FORTAMET) 1000 MG (OSM) 24 hr tablet Take 1 tablet (1,000 mg total) by mouth daily with supper. (Patient not taking: Reported on 01/05/2021) 30 tablet 0    metFORMIN (GLUCOPHAGE-XR) 500 MG 24 hr tablet Take 1 tablet (500 mg total) by mouth daily with breakfast. (Patient taking differently: Take 500 mg by mouth 2 (two) times a week.) 30 tablet 0    metoprolol succinate (TOPROL-XL) 25 MG 24 hr tablet Take 1 tablet (25 mg total) by mouth daily. 30 tablet 0    Multiple Vitamin (MULTIVITAMIN WITH MINERALS) TABS tablet Take 1 tablet by mouth daily as needed (for supplementation).      nicotine polacrilex (NICORETTE) 2 MG gum Take 1 each (2 mg total) by mouth as needed for smoking cessation. 100 tablet 0    omeprazole (PRILOSEC) 20 MG capsule Take 20 mg by mouth daily before breakfast.      pantoprazole (PROTONIX) 40 MG tablet Take 1 tablet (40 mg total) by mouth daily. (Patient not taking: Reported on 01/05/2021) 30 tablet 1    polyethylene glycol powder (GLYCOLAX/MIRALAX) 17 GM/SCOOP powder Take 17 g  by mouth daily as needed for mild constipation (MIX AND DRINK AS DIRECTED).      rosuvastatin (CRESTOR) 40 MG tablet Take 40 mg by mouth daily. (Patient not taking: Reported on 01/05/2021)      traZODone (DESYREL) 50 MG tablet Take 50 mg by mouth at bedtime as needed for sleep.      Musculoskeletal: Strength & Muscle Tone: within normal limits  Gait & Station: normal Patient leans: N/A Psychiatric Specialty Exam: Physical Exam Vitals and nursing note reviewed.  HENT:     Head: Normocephalic.     Nose: Nose normal.     Mouth/Throat:     Pharynx: Oropharynx is clear.  Eyes:     Pupils: Pupils are equal, round, and reactive to light.  Cardiovascular:     Rate and Rhythm: Normal rate.     Pulses: Normal pulses.  Pulmonary:     Effort: Pulmonary effort is normal.  Genitourinary:    Comments: Deferred Musculoskeletal:        General: Normal range of motion.  Skin:    General: Skin is warm and dry.  Neurological:     General: No focal deficit present.     Mental Status: He is alert and oriented to person, place, and time.    Review of Systems  Constitutional:  Negative for chills, diaphoresis and fever.  HENT:  Negative for congestion and sore throat.   Eyes:  Negative for blurred vision.  Respiratory:  Negative for cough and shortness of breath.   Cardiovascular:  Negative for chest pain and palpitations.  Gastrointestinal:  Negative for abdominal pain, constipation, diarrhea, heartburn, nausea and vomiting.  Genitourinary:  Negative for dysuria.  Musculoskeletal:  Negative for joint pain and myalgias.  Skin: Negative.   Neurological:  Negative for dizziness, tingling, tremors, sensory change, speech change, focal weakness, seizures, loss of consciousness, weakness and headaches.  Hematological:        Allergies: NKDA  Psychiatric/Behavioral:  Positive for depression and substance abuse. Negative for hallucinations, memory loss and suicidal ideas (UDS (+) for cocaine/THC). The  patient is not nervous/anxious and does not have insomnia.     Psychiatric Specialty Exam:  Presentation  General Appearance: Appropriate for Environment; Casual  Eye Contact:Good  Speech:Clear and Coherent; Normal Rate  Speech Volume:Normal  Handedness:Right  Mood and Affect  Mood:Worthless; Depressed  Affect:Congruent; Flat  Thought Process  Thought Processes:Coherent; Goal Directed; Linear  Duration of Psychotic Symptoms: N/A  Past Diagnosis of Schizophrenia or Psychoactive disorder: No  Descriptions of Associations:Intact  Orientation:Full (Time, Place and Person)  Thought Content:Logical  Hallucinations:Hallucinations: None  Ideas of Reference:None  Suicidal Thoughts:Suicidal Thoughts: No SI Active Intent and/or Plan: Without Intent; Without Plan; Without Means to Carry Out; Without Access to Means SI Passive Intent and/or Plan: Without Intent; Without Means to Carry Out; Without Plan; Without Access to Means  Homicidal Thoughts:Homicidal Thoughts: No HI Passive Intent and/or Plan: Without Intent; Without Means to Carry Out; Without Plan; Without Access to Means  Sensorium  Memory:Immediate Good; Recent Good; Remote Good  Judgment:Fair  Insight:Lacking  Executive Functions  Concentration:Fair  Attention Span:Fair  [place]  Language:Good  Psychomotor Activity  Psychomotor Activity:Psychomotor Activity: Normal  Assets  Assets:Communication Skills; Desire for Improvement; Financial Resources/Insurance; Resilience; Social Support  Sleep  Sleep:Sleep: Fair Number of Hours of Sleep: 4.75  Physical Exam: Physical Exam Vitals and nursing note reviewed.  HENT:     Head: Normocephalic.     Nose: Nose normal.     Mouth/Throat:     Pharynx: Oropharynx is clear.  Eyes:     Pupils: Pupils are equal, round, and reactive to light.  Cardiovascular:     Rate and Rhythm: Normal rate.     Pulses: Normal pulses.   Pulmonary:     Effort: Pulmonary effort is normal.  Genitourinary:    Comments: Deferred Musculoskeletal:  General: Normal range of motion.  Skin:    General: Skin is warm and dry.  Neurological:     General: No focal deficit present.     Mental Status: He is alert and oriented to person, place, and time.   Review of Systems  Constitutional:  Negative for chills, diaphoresis and fever.  HENT:  Negative for congestion and sore throat.   Eyes:  Negative for blurred vision.  Respiratory:  Negative for cough and shortness of breath.   Cardiovascular:  Negative for chest pain and palpitations.  Gastrointestinal:  Negative for abdominal pain, constipation, diarrhea, heartburn, nausea and vomiting.  Genitourinary:  Negative for dysuria.  Musculoskeletal:  Negative for joint pain and myalgias.  Skin: Negative.   Neurological:  Negative for dizziness, tingling, tremors, sensory change, speech change, focal weakness, seizures, loss of consciousness, weakness and headaches.  Endo/Heme/Allergies:        Allergies: NKDA  Psychiatric/Behavioral:  Positive for depression and substance abuse. Negative for hallucinations, memory loss and suicidal ideas (UDS (+) for cocaine/THC). The patient is not nervous/anxious and does not have insomnia.   Blood pressure 130/89, pulse 89, temperature 97.8 F (36.6 C), temperature source Oral, resp. rate 16, height 5\' 9"  (1.753 m), weight 77.6 kg, SpO2 100 %. Body mass index is 25.25 kg/m.  Treatment Plan Summary: Daily contact with patient to assess and evaluate symptoms and progress in treatment and Medication management.  Treatment Plan/Recommendations: 1. Admit for crisis management and stabilization, estimated length of stay 3-5 days.   2. Medication management to reduce current symptoms to base line and improve the patient's overall level of functioning: See [facility] for plan of care.   Bipolar depression.  Continue Abilify 10 mg po daily. Continue  Duloxetine 30 mg po daily.  Anxiety. Continue Vistaril 25 mg po tid prn.  Insomnia. Continue Trazodone 50 mg po Q hs prn.  Other medical issues, continue:  Albuterol 1-2 puff Q 6 hrs prn for SOB. ASA 81 mg po daily for heart health. Plavix 75 mg po daily for CAD.  Gabapentin 300 mg po tid for agitation/neuropathic pain. Metformin XR 500 mg po daily for DM.  Metoprolol 25 mg po daily for HTN.  Nicorette gum 2 mg prn for smoking cessation. Protonix 40 mg po daily (am) for GERD.  Crestor 40 mg po daily for high cholesterol.  Other prn medications for other medical complaints. Acetaminophen 650 mg po Q 6 hrs prn for pain/fever. Mylanta 30 ml po Q 4 hrs prn for indigestion.  MOM 30 ml po Q daily for constipation.  3. Develop treatment plan to decrease risk of relapse upon discharge and the need for readmission.  4. Psycho-social education regarding relapse prevention and self care.  5. Health care follow up as needed for medical problems.  6. Review, reconcile, and reinstate any pertinent home medications for other health issues where appropriate. 7. Call for consults with hospitalist for any additional specialty patient care services as needed.   Observation Level/Precautions:  15 minute checks  Laboratory:   Per ED, current lab results reviewed.  Psychotherapy: Enrolled in the group milieu  Medications: See [facility]  Consultations: As needed.  Discharge Concerns: Group sessions  Estimated LOS: 2-4 days  Other: Admit to the [facility]-hall.    Physician Treatment Plan for Primary Diagnosis: Bipolar affective disorder, depressed, severe ([place])  Long Term Goal(s): Improvement in symptoms so as ready for discharge  Short Term Goals: Ability to identify changes in lifestyle to reduce recurrence of  condition will improve, Ability to verbalize feelings will improve, Ability to disclose and discuss suicidal ideas, and Ability to demonstrate self-control will improve  Physician Treatment Plan for  Secondary Diagnosis: Principal Problem:   Bipolar affective disorder, depressed, severe ([place])  Long Term Goal(s): Improvement in symptoms so as ready for discharge  Short Term Goals: Ability to identify and develop effective coping behaviors will improve, Ability to maintain clinical measurements within normal limits will improve, and Ability to identify triggers associated with substance abuse/mental health issues will improve  I certify that inpatient services furnished can reasonably be expected to improve the patient's condition.    Lindell Spar, NP, pmhnp, fnp-bc 11/23/202212:06 PM

## 2021-01-07 NOTE — [facility] Group Notes (Signed)
Adult Psychoeducational Group Note  Date:  01/07/2021 Time:  8:38 AM  Group Topic/Focus:  Goals Group:   The focus of this group is to help patients establish daily goals to achieve during treatment and discuss how the patient can incorporate goal setting into their daily lives to aide in recovery.  Participation Level:  Active  Participation Quality:  Appropriate  Affect:  Appropriate  Cognitive:  Appropriate  Insight: Appropriate  Engagement in Group:  Engaged  Modes of Intervention:  Discussion  Additional Comments:  Pt. Goal ; Think positive and be more assertive.   Dub Mikes 01/07/2021, 8:38 AM

## 2021-01-08 DIAGNOSIS — F314 Bipolar disorder, current episode depressed, severe, without psychotic features: Secondary | ICD-10-CM | POA: Diagnosis not present

## 2021-01-08 LAB — GLUCOSE, CAPILLARY: Glucose-Capillary: 131 mg/dL — ABNORMAL HIGH (ref 70–99)

## 2021-01-08 NOTE — Progress Notes (Signed)
[facility] MD Progress Note  01/08/2021 12:57 PM [name]  MRN:  [id number]  Subjective: [name] reports, "I'm doing great today. I don't have any symptoms of depression or anxiety. I'm not feeling suicidal or thinking about hurting anyone else. No voices or seeing things. I'm on a different part this time around. I'm interested in going to a long term substance abuse treatment after discharge. I do not need a payee, I can learn to manage my own money".  Daily notes 01-08-21: [name] is seen, chart reviewed. The chart findings discussed with the treatment team. He presents alert, oriented & aware of situation. He is visible on the unit, attending group sessions. He presents with a cheerful affect, good eye contact & verbally responsive. He says he is doing great today. He denies any symptoms of depression or anxiety. He denies any SIHI, auditory/visual hallucinations, delusional thoughts or paranoia. He does not appear to be responding to any internal stimuli. [name] says he is taking & tolerating his treatment regimen. He denies any side effects. He is expressing his desire to go to a long term substance abuse treatment program after discharge. He maintained that he does not need a payee to help him manage his money as he is capable of doing so. There are no behavioral problems reported by staff.  Reason for admission: 65 year old AA male with hx of mental health issues, cocaine/THC use disorders, multiple psychiatric admissions & noncompliant to his mental health treatment recommendations. [name] was a recent patient in this [facility] from October 24th thru11-01-22 for calling the police reporting suicidal ideations & thoughts of self harm. After treatment/mood stabilizations at the time, he was discharged with a follow-up recommendations at the [place] in [place], [place]. This time around, [name] is admitted to the [facility] from the [facility] hospital with complain of suicidal ideations with plans to shoot  himself on the head. He does have hx of other chronic medical issues that includes, DM, HTN, GERD & s/p stent placement last August, 2022.   Principal Problem: Bipolar affective disorder, depressed, severe ([place])  Diagnosis: Principal Problem:   Bipolar affective disorder, depressed, severe ([place]) Active Problems:   HLD (hyperlipidemia)   Essential hypertension   Diabetes type 2, controlled ([place])   Coronary artery disease   Cocaine abuse ([place])  Total Time spent with patient:  35 minutes  Past Psychiatric History: Bipolar disorder.                                              Cocaine use disorder.  Past Medical History:  Past Medical History:  Diagnosis Date   Anxiety    Arthritis    Cancer ([place])    Cataract    Depression    Diabetes mellitus    GERD (gastroesophageal reflux disease)    H/O suicide attempt 1997 and 2011   jumped out of moving vehicle and attempted to jump into moving traffic   High cholesterol    Hypertension    Neuropathic pain    Osteoporosis     Past Surgical History:  Procedure Laterality Date   COLONOSCOPY     CORONARY STENT INTERVENTION N/A 10/10/2020   Procedure: CORONARY STENT INTERVENTION;  Surgeon: Jettie Booze, MD;  Location: [place] [facility];  Service: Cardiovascular;  Laterality: N/A;   LEFT HEART CATH AND CORONARY ANGIOGRAPHY N/A 10/10/2020  Procedure: LEFT HEART CATH AND CORONARY ANGIOGRAPHY;  Surgeon: Jettie Booze, MD;  Location: [place] [facility];  Service: Cardiovascular;  Laterality: N/A;   NEPHRECTOMY     due to cancer   RIGHT/LEFT HEART CATH AND CORONARY ANGIOGRAPHY N/A 10/09/2020   Procedure: RIGHT/LEFT HEART CATH AND CORONARY ANGIOGRAPHY;  Surgeon: Nelva Bush, MD;  Location: [place] [facility];  Service: Cardiovascular;  Laterality: N/A;   Family History:  Family History  Problem Relation Age of Onset   Alcohol abuse Sister    Alcohol abuse Brother    Colon cancer Neg Hx    Esophageal cancer Neg Hx     Pancreatic cancer Neg Hx    Rectal cancer Neg Hx    Stomach cancer Neg Hx    Family Psychiatric  History: See H&P  Social History:  Social History   Substance and Sexual Activity  Alcohol Use Yes   Alcohol/week: 3.0 standard drinks   Types: 3 Cans of beer per week   Comment: Pt has been clean for 8 months     Social History   Substance and Sexual Activity  Drug Use Yes   Types: "Crack" cocaine, Cocaine    Social History   Socioeconomic History   Marital status: Single    Spouse name: Not on file   Number of children: Not on file   Years of education: Not on file   Highest education level: Not on file  Occupational History   Not on file  Tobacco Use   Smoking status: Every Day    Packs/day: 0.50    Years: 20.00    Pack years: 10.00    Types: Cigarettes   Smokeless tobacco: Never  Vaping Use   Vaping Use: Never used  Substance and Sexual Activity   Alcohol use: Yes    Alcohol/week: 3.0 standard drinks    Types: 3 Cans of beer per week    Comment: Pt has been clean for 8 months   Drug use: Yes    Types: "Crack" cocaine, Cocaine   Sexual activity: Yes    Birth control/protection: Condom  Other Topics Concern   Not on file  Social History Narrative   Lives alone.  Has sister in area.   Social Determinants of Health   Financial Resource Strain: Not on file  Food Insecurity: Not on file  Transportation Needs: Not on file  Physical Activity: Not on file  Stress: Not on file  Social Connections: Not on file   Additional Social History:   Sleep: Good  Appetite:  Good  Current Medications: Current Facility-Administered Medications  Medication Dose Route Frequency Provider Last Rate Last Admin   acetaminophen (TYLENOL) tablet 650 mg  650 mg Oral Q6H PRN Lovena Le, Cody W, PA-C       albuterol (VENTOLIN HFA) 108 (90 Base) MCG/ACT inhaler 1-2 puff  1-2 puff Inhalation Q6H PRN Rankin, Shuvon B, NP       alum & mag hydroxide-simeth (MAALOX/MYLANTA) 200-200-20  MG/5ML suspension 30 mL  30 mL Oral Q4H PRN Lovena Le, Cody W, PA-C       ARIPiprazole (ABILIFY) tablet 10 mg  10 mg Oral Daily Durelle Zepeda I, NP   10 mg at 01/08/21 0750   aspirin EC tablet 81 mg  81 mg Oral Daily Rankin, Shuvon B, NP   81 mg at 01/08/21 0751   clopidogrel (PLAVIX) tablet 75 mg  75 mg Oral Daily Rankin, Shuvon B, NP   75 mg at 01/08/21 [phone]  DULoxetine (CYMBALTA) DR capsule 30 mg  30 mg Oral Daily Lindell Spar I, NP   30 mg at 01/08/21 0347   gabapentin (NEURONTIN) capsule 300 mg  300 mg Oral TID Lindell Spar I, NP   300 mg at 01/08/21 4259   hydrOXYzine (ATARAX/VISTARIL) tablet 25 mg  25 mg Oral TID PRN Prescilla Sours, PA-C   25 mg at 01/07/21 2115   magnesium hydroxide (MILK OF MAGNESIA) suspension 30 mL  30 mL Oral Daily PRN [name] W, PA-C       metFORMIN (GLUCOPHAGE-XR) 24 hr tablet 1,000 mg  1,000 mg Oral Q supper Rankin, Shuvon B, NP   1,000 mg at 01/07/21 1648   metFORMIN (GLUCOPHAGE-XR) 24 hr tablet 500 mg  500 mg Oral Q breakfast Rankin, Shuvon B, NP   500 mg at 01/08/21 0749   metoprolol succinate (TOPROL-XL) 24 hr tablet 25 mg  25 mg Oral Daily Rankin, Shuvon B, NP   25 mg at 01/08/21 0751   multivitamin with minerals tablet 1 tablet  1 tablet Oral Daily PRN Rankin, Shuvon B, NP       nicotine polacrilex (NICORETTE) gum 2 mg  2 mg Oral PRN Rankin, Shuvon B, NP       pantoprazole (PROTONIX) EC tablet 40 mg  40 mg Oral Daily Rankin, Shuvon B, NP   40 mg at 01/08/21 0749   rosuvastatin (CRESTOR) tablet 40 mg  40 mg Oral Daily Rankin, Shuvon B, NP   40 mg at 01/08/21 0749   traZODone (DESYREL) tablet 50 mg  50 mg Oral QHS PRN Rankin, Shuvon B, NP   50 mg at 01/07/21 2115   Lab Results:  Results for orders placed or performed during the hospital encounter of 01/06/21 (from the past 48 hour(s))  Glucose, capillary     Status: Abnormal   Collection Time: 01/07/21  6:36 AM  Result Value Ref Range   Glucose-Capillary 180 (H) 70 - 99 mg/dL    Comment: Glucose reference  range applies only to samples taken after fasting for at least 8 hours.  Basic metabolic panel     Status: Abnormal   Collection Time: 01/07/21  6:29 PM  Result Value Ref Range   Sodium 137 135 - 145 mmol/L   Potassium 4.3 3.5 - 5.1 mmol/L   Chloride 105 98 - 111 mmol/L   CO2 28 22 - 32 mmol/L   Glucose, Bld 178 (H) 70 - 99 mg/dL    Comment: Glucose reference range applies only to samples taken after fasting for at least 8 hours.   BUN 15 8 - 23 mg/dL   Creatinine, Ser 0.87 0.61 - 1.24 mg/dL   Calcium 8.9 8.9 - 10.3 mg/dL   GFR, Estimated >60 >60 mL/min    Comment: (NOTE) Calculated using the CKD-EPI Creatinine Equation (2021)    Anion gap 4 (L) 5 - 15    Comment: Performed at [facility], [place] [address], [place], [place] [address]  Glucose, capillary     Status: Abnormal   Collection Time: 01/08/21  5:39 AM  Result Value Ref Range   Glucose-Capillary 131 (H) 70 - 99 mg/dL    Comment: Glucose reference range applies only to samples taken after fasting for at least 8 hours.   Comment 1 Notify RN    Blood Alcohol level:  Lab Results  Component Value Date   ETH <10 01/04/2021   ETH <10 56/43/3295   Metabolic Disorder Labs: Lab Results  Component Value Date   HGBA1C 6.8 (H) 12/10/2020   MPG 148.46 12/10/2020   MPG 188.64 10/10/2020   No results found for: PROLACTIN Lab Results  Component Value Date   CHOL 177 12/10/2020   TRIG 124 12/10/2020   HDL 50 12/10/2020   CHOLHDL 3.5 12/10/2020   VLDL 25 12/10/2020   LDLCALC 102 (H) 12/10/2020   LDLCALC 82 10/10/2020   Physical Findings: AIMS:  , ,  ,  ,    CIWA:    COWS:     Musculoskeletal: Strength & Muscle Tone: within normal limits Gait & Station: normal Patient leans: N/A  Psychiatric Specialty Exam:  Presentation  General Appearance: Appropriate for Environment; Casual; Fairly Groomed  Eye Contact:Good  Speech:Normal Rate  Speech Volume:Normal  Handedness:Right   Mood and Affect   Mood:Euthymic  Affect:Appropriate; Congruent  Thought Process  Thought Processes:Coherent; Goal Directed; Linear  Descriptions of Associations:Intact  Orientation:Full (Time, Place and Person)  Thought Content:Logical  History of Schizophrenia/Schizoaffective disorder:No  Duration of Psychotic Symptoms:N/A  Hallucinations:Hallucinations: None  Ideas of Reference:None  Suicidal Thoughts:Suicidal Thoughts: No SI Active Intent and/or Plan: Without Intent; Without Plan; Without Means to Carry Out; Without Access to Means SI Passive Intent and/or Plan: Without Intent; Without Means to Carry Out; Without Plan; Without Access to Means  Homicidal Thoughts:Homicidal Thoughts: No HI Passive Intent and/or Plan: Without Intent; Without Plan; Without Means to Carry Out; Without Access to Means  Sensorium  Memory:Immediate Good; Recent Good; Remote Good  Judgment:Fair  Insight:Fair   Executive Functions  Concentration:Good  Attention Span:Good  [place]  Language:Good   Psychomotor Activity  Psychomotor Activity:Psychomotor Activity: Normal  Assets  Assets:Communication Skills; Desire for Improvement; Financial Resources/Insurance; Resilience   Sleep  Sleep:Sleep: Good Number of Hours of Sleep: 6.75  Physical Exam: Physical Exam Vitals and nursing note reviewed.  HENT:     Mouth/Throat:     Pharynx: Oropharynx is clear.  Eyes:     Pupils: Pupils are equal, round, and reactive to light.  Cardiovascular:     Rate and Rhythm: Normal rate.     Pulses: Normal pulses.  Pulmonary:     Effort: Pulmonary effort is normal.  Genitourinary:    Comments: Deferred Musculoskeletal:        General: Normal range of motion.     Cervical back: Normal range of motion.  Skin:    General: Skin is warm and dry.  Neurological:     General: No focal deficit present.     Mental Status: He is alert and oriented to person, place, and time.    Review of Systems  Constitutional:  Negative for chills and fever.  HENT:  Negative for congestion and sore throat.   Eyes:  Negative for blurred vision.  Respiratory:  Negative for cough, shortness of breath and wheezing.   Cardiovascular:  Negative for chest pain and palpitations.  Gastrointestinal:  Negative for abdominal pain, constipation, diarrhea, heartburn, nausea and vomiting.  Genitourinary:  Negative for dysuria.  Musculoskeletal:  Negative for joint pain and myalgias.  Neurological:  Negative for dizziness, tingling, tremors, sensory change, speech change, focal weakness, seizures, loss of consciousness, weakness and headaches.  Endo/Heme/Allergies:        Allergies: NKDA  Psychiatric/Behavioral:  Positive for substance abuse (Hx. cocaine use disorder). Negative for depression, hallucinations, memory loss and suicidal ideas. The patient is not nervous/anxious and does not have insomnia.   Blood pressure 115/75, pulse (!) 111, temperature 97.7 F (36.5  C), resp. rate 16, height 5\' 9"  (1.753 m), weight 77.6 kg, SpO2 100 %. Body mass index is 25.25 kg/m.  Treatment Plan Summary: Daily contact with patient to assess and evaluate symptoms and progress in treatment and Medication management.   Continue inpatient hospitalization.  Will continue today 01/08/2021 plan as below except where it is noted.   2. Medication management to reduce current symptoms to base line and improve the patient's overall level of functioning: See [facility] for plan of care.    Bipolar depression.  Continue Abilify 10 mg po daily. Continue Duloxetine 30 mg po daily.   Anxiety. Continue Vistaril 25 mg po tid prn.   Insomnia. Continue Trazodone 50 mg po Q hs prn.   Other medical issues, continue:  Albuterol 1-2 puff Q 6 hrs prn for SOB. ASA 81 mg po daily for heart health. Plavix 75 mg po daily for CAD.  Gabapentin 300 mg po tid for agitation/neuropathic pain. Metformin XR 500 mg po daily for DM.   Metoprolol 25 mg po daily for HTN.  Nicorette gum 2 mg prn for smoking cessation. Protonix 40 mg po daily (am) for GERD.  Crestor 40 mg po daily for high cholesterol.   Other prn medications for other medical complaints. Acetaminophen 650 mg po Q 6 hrs prn for pain/fever. Mylanta 30 ml po Q 4 hrs prn for indigestion.  MOM 30 ml po Q daily for constipation.  Encourage group participation.  Discharge disposition plan is ongoing.  Lindell Spar, NP, pmhnp, fnp-bc. 01/08/2021, 12:57 PM

## 2021-01-08 NOTE — BHH Group Notes (Signed)
Pt participated in Psycho-Ed group this afternoon.

## 2021-01-08 NOTE — Progress Notes (Signed)
      01/07/21 2115  Psych Admission Type (Psych Patients Only)  Admission Status Voluntary  Psychosocial Assessment  Patient Complaints Anxiety;Depression;Hopelessness  Eye Contact Fair  Facial Expression Sad  Affect Depressed;Sad  Speech Logical/coherent  Interaction Guarded  Motor Activity Slow  Appearance/Hygiene Unremarkable  Behavior Characteristics Anxious  Mood Depressed;Anxious  Thought Process  Coherency WDL  Content WDL  Delusions WDL  Perception WDL  Hallucination None reported or observed  Judgment Impaired  Confusion WDL  Danger to Self  Current suicidal ideation? Denies  Self-Injurious Behavior No self-injurious ideation or behavior indicators observed or expressed   Agreement Not to Harm Self Yes  Description of Agreement Verbal contract for safety  Danger to Others  Danger to Others None reported or observed

## 2021-01-08 NOTE — Plan of Care (Signed)
Nurse discussed coping skills with patient.  

## 2021-01-08 NOTE — Plan of Care (Signed)
  Problem: Education: Goal: Knowledge of the prescribed therapeutic regimen will improve Outcome: Progressing   Problem: Self-Concept: Goal: Level of anxiety will decrease Outcome: Not Progressing Goal: Ability to modify response to factors that promote anxiety will improve Outcome: Not Progressing

## 2021-01-08 NOTE — BHH Group Notes (Signed)
PT participated in wrap up group and was ina good mood.He said her had a good day socializing with peers and going to groups.

## 2021-01-08 NOTE — Progress Notes (Addendum)
D:  Patient's self inventory sheet, patient sleeps good, no sleep medication.  Good appetite, normal energy level, good concentration.  Rated depression 3, hopeless and anxiety #2.  Withdrawals, cravings.  Denied SI.  Physical problems, pain, knees, worst pain #6 in past 24 hours.  Pain medication is helpful.  Goal is think positive.  Plans to be assertive.  Does have discharge plans. A:  Medications administered per MD orders.  Emotional support and encouragement given patient. R:  Safety maintained with 15 minute checks.  SI denied, contracts for safety.  HI denied. Denied A/V hallucinations.

## 2021-01-08 NOTE — Progress Notes (Signed)
   01/08/21 2210  Psych Admission Type (Psych Patients Only)  Admission Status Voluntary  Psychosocial Assessment  Patient Complaints Anxiety;Depression  Eye Contact Brief  Facial Expression Anxious  Affect Depressed  Speech Logical/coherent  Interaction Guarded  Motor Activity Slow  Appearance/Hygiene Unremarkable  Behavior Characteristics Anxious  Mood Anxious;Depressed  Thought Process  Coherency WDL  Content WDL  Delusions WDL  Perception WDL  Hallucination None reported or observed  Judgment Impaired  Confusion None  Danger to Self  Current suicidal ideation? Denies  Self-Injurious Behavior No self-injurious ideation or behavior indicators observed or expressed   Agreement Not to Harm Self Yes  Description of Agreement Verbal contract for safety  Danger to Others  Danger to Others None reported or observed

## 2021-01-08 NOTE — BHH Group Notes (Signed)
Pt participated in afternoon stretch, snack and sports discussion. Pt. Ran track when he was younger. Tobacco education given.

## 2021-01-08 NOTE — BHH Group Notes (Signed)
Pt. Participated in group. Pt. Stated what is thankful for and his favorite Thanksgiving dish is Honey Baked ham.

## 2021-01-09 DIAGNOSIS — F314 Bipolar disorder, current episode depressed, severe, without psychotic features: Secondary | ICD-10-CM | POA: Diagnosis not present

## 2021-01-09 LAB — GLUCOSE, CAPILLARY: Glucose-Capillary: 135 mg/dL — ABNORMAL HIGH (ref 70–99)

## 2021-01-09 NOTE — BHH Counselor (Signed)
CSW emailed a letter to the [place] requesting that pt be appointed a Engineer, water. Copy of letter can be found in communications. Letter was signed by NP Herbert Pun.   [name], [place] Worker [employer]

## 2021-01-09 NOTE — Group Note (Signed)
LCSW Group Therapy Note  Group Date: 01/09/2021 Start Time: 1300 End Time: 1400   Type of Therapy and Topic:  Group Therapy - Healthy vs Unhealthy Coping Skills  Participation Level:  Active   Description of Group The focus of this group was to determine what unhealthy coping techniques typically are used by group members and what healthy coping techniques would be helpful in coping with various problems. Patients were guided in becoming aware of the differences between healthy and unhealthy coping techniques. Patients were asked to identify 2-3 healthy coping skills they would like to learn to use more effectively.  Therapeutic Goals Patients learned that coping is what human beings do all day long to deal with various situations in their lives Patients defined and discussed healthy vs unhealthy coping techniques Patients identified their preferred coping techniques and identified whether these were healthy or unhealthy Patients determined 2-3 healthy coping skills they would like to become more familiar with and use more often. Patients provided support and ideas to each other    Therapeutic Modalities Cognitive Behavioral Therapy Motivational Interviewing  [name] 01/09/2021  1:39 PM

## 2021-01-09 NOTE — BHH Counselor (Signed)
Pt was given a folder of homeless resources that included shelters, [place] houses, food pantries, [employer] card, and crisis resources.    CSW again informed pt that he needs to call his social worker at the [facility] [place] and request a Mental health consult be placed for community care through [employer] in order to move onto the next step in the process for [employer].  CSW reminded pt that he cannot go to [facility] due to having VA insurance.  Toney Reil, [place] Worker [employer]

## 2021-01-09 NOTE — Progress Notes (Signed)
LCSW Note   01/09/2021    2:27 PM   Type of Contact and Topic:  SUD treatment options     CSW met with patient to discuss substance use treatment options. Patient is agreeable to start calling [facility]. Patient reports he would like to go to [facility] substance use outpatient clinic for group therapy. No further action. TOC department to arrange outpatient substance use treatment.     Signed:  Durenda Hurt, MSW, LCSWA, LCAS 01/09/2021 2:27 PM    `

## 2021-01-09 NOTE — BHH Group Notes (Signed)
Pt attended morning goals group as well as morning psycho-ed group. Pt had great participation today.

## 2021-01-09 NOTE — Progress Notes (Signed)
Patient did not attend the evening speaker AA meeting.  

## 2021-01-09 NOTE — Progress Notes (Signed)
Pt denies SI/HI/AVH and verbally agrees to approach staff if these become apparent or before harming themselves/others. Rates depression 0/10. Rates anxiety 0/10. Rates pain 0/10. Pt goal is to stay positive. Pt is needing to call the [place]. Pt has been seen socializing with other pts. Pt in a happy mood. Pt states " I am doing great." Scheduled medications administered to Pt, per MD orders. RN provided support and encouragement to Pt. Q15 min safety checks implemented and continued. Pt safe on the unit. RN will continue to monitor and intervene as needed.   01/09/21 0757  Psych Admission Type (Psych Patients Only)  Admission Status Voluntary  Psychosocial Assessment  Patient Complaints None  Eye Contact Fair  Facial Expression Other (Comment) (WDL)  Affect Appropriate to circumstance  Speech Logical/coherent  Interaction Assertive  Motor Activity Slow  Appearance/Hygiene Unremarkable  Behavior Characteristics Cooperative;Appropriate to situation;Calm  Mood Pleasant;Euthymic  Thought Process  Coherency WDL  Content WDL  Delusions None reported or observed  Perception WDL  Hallucination None reported or observed  Judgment Impaired  Confusion None  Danger to Self  Current suicidal ideation? Denies  Self-Injurious Behavior No self-injurious ideation or behavior indicators observed or expressed   Agreement Not to Harm Self Yes  Description of Agreement Verbal contract for safety  Danger to Others  Danger to Others None reported or observed

## 2021-01-09 NOTE — Progress Notes (Signed)
[facility] MD Progress Note  01/09/2021 11:57 AM [name]  MRN:  [id number]  Subjective: Saron reports, "I'm doing well. I feel good & ready to go. I got this shelter lists & I have been calling them to find available beds. I'm hoping to get into the [facility] because I could stay there till I can get on my feet again & find me an apartment. I have no complaints today".  Daily notes 01-09-21: Kong is seen in the office on the 300-hall, chart reviewed. The chart findings discussed with the treatment team. He presents alert, oriented & aware of situation. He is visible on the unit, attending group sessions. He presents with a cheerful affect, good eye contact & verbally responsive. He says he is doing well & feeling goody. He says he is ready for discharge & currently calling shelters within the [place] area for bed availability. He is hoping to get into the [facility] where he could stay & get himself together & then find an apartment. He currently denies any symptoms of depression or anxiety. He denies any SIHI, auditory/visual hallucinations, delusional thoughts or paranoia. He does not appear to be responding to any internal stimuli. Owen says he is taking & tolerating his treatment regimen. He denies any side effects. He is expressing his desire to go to a long term substance abuse treatment program after discharge or move into a shelter. He maintained that he does not need a payee to help him manage his money as he is capable of doing so. There are no behavioral problems reported by staff.  Reason for admission: 65 year old AA male with hx of mental health issues, cocaine/THC use disorders, multiple psychiatric admissions & noncompliant to his mental health treatment recommendations. Jettson was a recent patient in this [facility] from October 24th thru11-01-22 for calling the police reporting suicidal ideations & thoughts of self harm. After treatment/mood  stabilizations at the time, he was discharged with a follow-up recommendations at the [place] in [place], [place]. This time around, Bazil is admitted to the [facility] from the [facility] hospital with complain of suicidal ideations with plans to shoot himself on the head. He does have hx of other chronic medical issues that includes, DM, HTN, GERD & s/p stent placement last August, 2022.   Principal Problem: Bipolar affective disorder, depressed, severe ([place])  Diagnosis: Principal Problem:   Bipolar affective disorder, depressed, severe ([place]) Active Problems:   HLD (hyperlipidemia)   Essential hypertension   Diabetes type 2, controlled ([place])   Coronary artery disease   Cocaine abuse ([place])  Total Time spent with patient:  35 minutes  Past Psychiatric History: Bipolar disorder.                                              Cocaine use disorder.  Past Medical History:  Past Medical History:  Diagnosis Date   Anxiety    Arthritis    Cancer ([place])    Cataract    Depression    Diabetes mellitus    GERD (gastroesophageal reflux disease)    H/O suicide attempt 1997 and 2011   jumped out of moving vehicle and attempted to jump into moving traffic   High cholesterol    Hypertension    Neuropathic pain    Osteoporosis     Past Surgical History:  Procedure Laterality  Date   COLONOSCOPY     CORONARY STENT INTERVENTION N/A 10/10/2020   Procedure: CORONARY STENT INTERVENTION;  Surgeon: Jettie Booze, MD;  Location: [facility];  Service: Cardiovascular;  Laterality: N/A;   LEFT HEART CATH AND CORONARY ANGIOGRAPHY N/A 10/10/2020   Procedure: LEFT HEART CATH AND CORONARY ANGIOGRAPHY;  Surgeon: Jettie Booze, MD;  Location: [facility];  Service: Cardiovascular;  Laterality: N/A;   NEPHRECTOMY     due to cancer   RIGHT/LEFT HEART CATH AND CORONARY ANGIOGRAPHY N/A 10/09/2020   Procedure: RIGHT/LEFT HEART CATH AND CORONARY ANGIOGRAPHY;  Surgeon: Nelva Bush, MD;   Location: [facility];  Service: Cardiovascular;  Laterality: N/A;   Family History:  Family History  Problem Relation Age of Onset   Alcohol abuse Sister    Alcohol abuse Brother    Colon cancer Neg Hx    Esophageal cancer Neg Hx    Pancreatic cancer Neg Hx    Rectal cancer Neg Hx    Stomach cancer Neg Hx    Family Psychiatric  History: See H&P  Social History:  Social History   Substance and Sexual Activity  Alcohol Use Yes   Alcohol/week: 3.0 standard drinks   Types: 3 Cans of beer per week   Comment: Pt has been clean for 8 months     Social History   Substance and Sexual Activity  Drug Use Yes   Types: "Crack" cocaine, Cocaine    Social History   Socioeconomic History   Marital status: Single    Spouse name: Not on file   Number of children: Not on file   Years of education: Not on file   Highest education level: Not on file  Occupational History   Not on file  Tobacco Use   Smoking status: Every Day    Packs/day: 0.50    Years: 20.00    Pack years: 10.00    Types: Cigarettes   Smokeless tobacco: Never  Vaping Use   Vaping Use: Never used  Substance and Sexual Activity   Alcohol use: Yes    Alcohol/week: 3.0 standard drinks    Types: 3 Cans of beer per week    Comment: Pt has been clean for 8 months   Drug use: Yes    Types: "Crack" cocaine, Cocaine   Sexual activity: Yes    Birth control/protection: Condom  Other Topics Concern   Not on file  Social History Narrative   Lives alone.  Has sister in area.   Social Determinants of Health   Financial Resource Strain: Not on file  Food Insecurity: Not on file  Transportation Needs: Not on file  Physical Activity: Not on file  Stress: Not on file  Social Connections: Not on file   Additional Social History:   Sleep: Good  Appetite:  Good  Current Medications: Current Facility-Administered Medications  Medication Dose Route Frequency Provider Last Rate Last Admin   acetaminophen  (TYLENOL) tablet 650 mg  650 mg Oral Q6H PRN Lovena Le, Cody W, PA-C       albuterol (VENTOLIN HFA) 108 (90 Base) MCG/ACT inhaler 1-2 puff  1-2 puff Inhalation Q6H PRN Rankin, Shuvon B, NP       alum & mag hydroxide-simeth (MAALOX/MYLANTA) 200-200-20 MG/5ML suspension 30 mL  30 mL Oral Q4H PRN Lovena Le, Cody W, PA-C       ARIPiprazole (ABILIFY) tablet 10 mg  10 mg Oral Daily Irania Durell I, NP   10 mg at  01/09/21 0757   aspirin EC tablet 81 mg  81 mg Oral Daily Rankin, Shuvon B, NP   81 mg at 01/09/21 0757   clopidogrel (PLAVIX) tablet 75 mg  75 mg Oral Daily Rankin, Shuvon B, NP   75 mg at 01/09/21 0759   DULoxetine (CYMBALTA) DR capsule 30 mg  30 mg Oral Daily Lindell Spar I, NP   30 mg at 01/09/21 0757   gabapentin (NEURONTIN) capsule 300 mg  300 mg Oral TID Lindell Spar I, NP   300 mg at 01/09/21 0757   hydrOXYzine (ATARAX/VISTARIL) tablet 25 mg  25 mg Oral TID PRN Prescilla Sours, PA-C   25 mg at 01/07/21 2115   magnesium hydroxide (MILK OF MAGNESIA) suspension 30 mL  30 mL Oral Daily PRN [name] W, PA-C       metFORMIN (GLUCOPHAGE-XR) 24 hr tablet 1,000 mg  1,000 mg Oral Q supper Rankin, Shuvon B, NP   1,000 mg at 01/08/21 1720   metFORMIN (GLUCOPHAGE-XR) 24 hr tablet 500 mg  500 mg Oral Q breakfast Rankin, Shuvon B, NP   500 mg at 01/09/21 0757   metoprolol succinate (TOPROL-XL) 24 hr tablet 25 mg  25 mg Oral Daily Rankin, Shuvon B, NP   25 mg at 01/09/21 0757   multivitamin with minerals tablet 1 tablet  1 tablet Oral Daily PRN Rankin, Shuvon B, NP       nicotine polacrilex (NICORETTE) gum 2 mg  2 mg Oral PRN Rankin, Shuvon B, NP       pantoprazole (PROTONIX) EC tablet 40 mg  40 mg Oral Daily Rankin, Shuvon B, NP   40 mg at 01/09/21 0757   rosuvastatin (CRESTOR) tablet 40 mg  40 mg Oral Daily Rankin, Shuvon B, NP   40 mg at 01/09/21 0756   traZODone (DESYREL) tablet 50 mg  50 mg Oral QHS PRN Rankin, Shuvon B, NP   50 mg at 01/07/21 2115   Lab Results:  Results for orders placed or  performed during the hospital encounter of 01/06/21 (from the past 48 hour(s))  Basic metabolic panel     Status: Abnormal   Collection Time: 01/07/21  6:29 PM  Result Value Ref Range   Sodium 137 135 - 145 mmol/L   Potassium 4.3 3.5 - 5.1 mmol/L   Chloride 105 98 - 111 mmol/L   CO2 28 22 - 32 mmol/L   Glucose, Bld 178 (H) 70 - 99 mg/dL    Comment: Glucose reference range applies only to samples taken after fasting for at least 8 hours.   BUN 15 8 - 23 mg/dL   Creatinine, Ser 0.87 0.61 - 1.24 mg/dL   Calcium 8.9 8.9 - 10.3 mg/dL   GFR, Estimated >60 >60 mL/min    Comment: (NOTE) Calculated using the CKD-EPI Creatinine Equation (2021)    Anion gap 4 (L) 5 - 15    Comment: Performed at [facility], [place] [address] [place], [place] [address]  Glucose, capillary     Status: Abnormal   Collection Time: 01/08/21  5:39 AM  Result Value Ref Range   Glucose-Capillary 131 (H) 70 - 99 mg/dL    Comment: Glucose reference range applies only to samples taken after fasting for at least 8 hours.   Comment 1 Notify RN   Glucose, capillary     Status: Abnormal   Collection Time: 01/09/21  6:02 AM  Result Value Ref Range   Glucose-Capillary 135 (H) 70 - 99  mg/dL    Comment: Glucose reference range applies only to samples taken after fasting for at least 8 hours.   Blood Alcohol level:  Lab Results  Component Value Date   ETH <10 01/04/2021   ETH <10 74/09/1446   Metabolic Disorder Labs: Lab Results  Component Value Date   HGBA1C 6.8 (H) 12/10/2020   MPG 148.46 12/10/2020   MPG 188.64 10/10/2020   No results found for: PROLACTIN Lab Results  Component Value Date   CHOL 177 12/10/2020   TRIG 124 12/10/2020   HDL 50 12/10/2020   CHOLHDL 3.5 12/10/2020   VLDL 25 12/10/2020   LDLCALC 102 (H) 12/10/2020   LDLCALC 82 10/10/2020   Physical Findings: AIMS:  , ,  ,  ,    CIWA:    COWS:     Musculoskeletal: Strength & Muscle Tone: within normal limits Gait &  Station: normal Patient leans: N/A  Psychiatric Specialty Exam:  Presentation  General Appearance: Appropriate for Environment; Casual; Fairly Groomed  Eye Contact:Good  Speech:Normal Rate  Speech Volume:Normal  Handedness:Right   Mood and Affect  Mood:Euthymic  Affect:Appropriate; Congruent  Thought Process  Thought Processes:Coherent; Goal Directed; Linear  Descriptions of Associations:Intact  Orientation:Full (Time, Place and Person)  Thought Content:Logical  History of Schizophrenia/Schizoaffective disorder:No  Duration of Psychotic Symptoms:N/A  Hallucinations:Hallucinations: None  Ideas of Reference:None  Suicidal Thoughts:Suicidal Thoughts: No SI Active Intent and/or Plan: Without Intent; Without Plan; Without Means to Carry Out; Without Access to Means SI Passive Intent and/or Plan: Without Intent; Without Means to Carry Out; Without Plan; Without Access to Means  Homicidal Thoughts:Homicidal Thoughts: No HI Passive Intent and/or Plan: Without Intent; Without Plan; Without Means to Carry Out; Without Access to Means  Sensorium  Memory:Immediate Good; Recent Good; Remote Good  Judgment:Fair  Insight:Fair   Executive Functions  Concentration:Good  Attention Span:Good  [place]  Language:Good   Psychomotor Activity  Psychomotor Activity:Psychomotor Activity: Normal  Assets  Assets:Communication Skills; Desire for Improvement; Financial Resources/Insurance; Resilience   Sleep  Sleep:Sleep: Good Number of Hours of Sleep: 6.75  Physical Exam: Physical Exam Vitals and nursing note reviewed.  HENT:     Mouth/Throat:     Pharynx: Oropharynx is clear.  Eyes:     Pupils: Pupils are equal, round, and reactive to light.  Cardiovascular:     Rate and Rhythm: Normal rate.     Pulses: Normal pulses.  Pulmonary:     Effort: Pulmonary effort is normal.  Genitourinary:    Comments: Deferred Musculoskeletal:         General: Normal range of motion.     Cervical back: Normal range of motion.  Skin:    General: Skin is warm and dry.  Neurological:     General: No focal deficit present.     Mental Status: He is alert and oriented to person, place, and time.   Review of Systems  Constitutional:  Negative for chills and fever.  HENT:  Negative for congestion and sore throat.   Eyes:  Negative for blurred vision.  Respiratory:  Negative for cough, shortness of breath and wheezing.   Cardiovascular:  Negative for chest pain and palpitations.  Gastrointestinal:  Negative for abdominal pain, constipation, diarrhea, heartburn, nausea and vomiting.  Genitourinary:  Negative for dysuria.  Musculoskeletal:  Negative for joint pain and myalgias.  Neurological:  Negative for dizziness, tingling, tremors, sensory change, speech change, focal weakness, seizures, loss of consciousness, weakness and headaches.  Endo/Heme/Allergies:  Allergies: NKDA  Psychiatric/Behavioral:  Positive for substance abuse (Hx. cocaine use disorder). Negative for depression, hallucinations, memory loss and suicidal ideas. The patient is not nervous/anxious and does not have insomnia.   Blood pressure 140/88, pulse (!) 114, temperature 97.8 F (36.6 C), resp. rate 16, height 5\' 9"  (1.753 m), weight 77.6 kg, SpO2 99 %. Body mass index is 25.25 kg/m.  Treatment Plan Summary: Daily contact with patient to assess and evaluate symptoms and progress in treatment and Medication management.   Continue inpatient hospitalization.  Will continue today 01/09/2021 plan as below except where it is noted.   2. Medication management to reduce current symptoms to base line and improve the patient's overall level of functioning: See [facility] for plan of care.    Bipolar depression.  Continue Abilify 10 mg po daily. Continue Duloxetine 30 mg po daily.   Anxiety. Continue Vistaril 25 mg po tid prn.   Insomnia. Continue Trazodone 50 mg po Q  hs prn.   Other medical issues, continue:  Albuterol 1-2 puff Q 6 hrs prn for SOB. ASA 81 mg po daily for heart health. Plavix 75 mg po daily for CAD.  Gabapentin 300 mg po tid for agitation/neuropathic pain. Metformin XR 500 mg po daily for DM.  Metoprolol 25 mg po daily for HTN.  Nicorette gum 2 mg prn for smoking cessation. Protonix 40 mg po daily (am) for GERD.  Crestor 40 mg po daily for high cholesterol.   Other prn medications for other medical complaints. Acetaminophen 650 mg po Q 6 hrs prn for pain/fever. Mylanta 30 ml po Q 4 hrs prn for indigestion.  MOM 30 ml po Q daily for constipation.  Encourage group participation.  Discharge disposition plan is ongoing.  Lindell Spar, NP, pmhnp, fnp-bc. 01/09/2021, 11:57 AM Patient ID: [name], male   DOB: [birth year], 65 y.o.   MRN: [id number]

## 2021-01-09 NOTE — [facility] Group Notes (Signed)
Pt attended Relaxation Group this afternoon and had a great time listening to music and doing Karaoke.

## 2021-01-09 NOTE — [facility] Group Notes (Signed)
Pt attended Psycho-Ed group this afternoon.

## 2021-01-10 DIAGNOSIS — F314 Bipolar disorder, current episode depressed, severe, without psychotic features: Principal | ICD-10-CM

## 2021-01-10 DIAGNOSIS — R45851 Suicidal ideations: Secondary | ICD-10-CM

## 2021-01-10 LAB — GLUCOSE, CAPILLARY: Glucose-Capillary: 158 mg/dL — ABNORMAL HIGH (ref 70–99)

## 2021-01-10 MED ORDER — WHITE PETROLATUM EX OINT
TOPICAL_OINTMENT | CUTANEOUS | Status: AC
  Filled 2021-01-10: qty 5

## 2021-01-10 NOTE — Progress Notes (Signed)
[place] Group Notes:  (Nursing/MHT/Case Management/Adjunct)  Date:  01/10/2021  Time:  2015 Type of Therapy:   wrap up group  Participation Level:  Active  Participation Quality:  Appropriate, Attentive, Sharing, and Supportive  Affect:  Appropriate  Cognitive:  Alert  Insight:  Improving  Engagement in Group:  Engaged  Modes of Intervention:  Clarification, Education, and Support  Summary of Progress/Problems: Positive thinking and positive change were discussed.   Shellia Cleverly 01/10/2021, 8:46 PM

## 2021-01-10 NOTE — Progress Notes (Signed)
   01/09/21 2134  Psychosocial Assessment  Patient Complaints Insomnia  Eye Contact Fair  Facial Expression Animated  Affect Appropriate to circumstance  Speech Logical/coherent  Interaction Assertive  Motor Activity Slow  Appearance/Hygiene Unremarkable  Behavior Characteristics Appropriate to situation;Cooperative  Mood Pleasant  Thought Process  Coherency WDL  Content WDL  Delusions None reported or observed  Perception WDL  Hallucination None reported or observed  Judgment Impaired  Confusion None  Danger to Self  Current suicidal ideation? Denies  Danger to Others  Danger to Others None reported or observed

## 2021-01-10 NOTE — [facility] Group Notes (Signed)
.  Psychoeducational Group Note    Date:01/10/2021 Time: 1300-1400    Purpose of Group: . The group focus' on teaching patients on how to identify their needs and their Life Skills:  A group where two lists are made. What people need and what are things that we do that are unhealthy. The lists are developed by the patients and it is explained that we often do the actions that are not healthy to get our list of needs met.  Goal:: to develop the coping skills needed to get their needs met  Participation Level:  Active  Participation Quality:  Appropriate  Affect:  Appropriate  Cognitive:  Oriented  Insight:  Improving  Engagement in Group:  Engaged  Additional Comments:  Pt attended the group and participated fully. Showing good insight.  [name]

## 2021-01-10 NOTE — Progress Notes (Signed)
   01/10/21 2158  Psych Admission Type (Psych Patients Only)  Admission Status Voluntary  Psychosocial Assessment  Patient Complaints None  Eye Contact Fair  Facial Expression Animated  Affect Appropriate to circumstance  Speech Logical/coherent  Interaction Assertive  Motor Activity Slow  Appearance/Hygiene Unremarkable  Behavior Characteristics Appropriate to situation  Mood Pleasant  Thought Process  Coherency WDL  Content WDL  Delusions None reported or observed  Perception WDL  Hallucination None reported or observed  Judgment Impaired  Confusion None  Danger to Self  Current suicidal ideation? Denies  Self-Injurious Behavior No self-injurious ideation or behavior indicators observed or expressed   Agreement Not to Harm Self Yes  Description of Agreement Verbal contract for safety  Danger to Others  Danger to Others None reported or observed

## 2021-01-10 NOTE — Progress Notes (Signed)
[facility] MD Progress Note  01/10/2021 3:30 PM [name]  MRN:  [id number]  Reason for admission: 65 year old AA male with hx of mental health issues, cocaine/THC use disorders, multiple psychiatric admissions & noncompliant to his mental health treatment recommendations. [name] was a recent patient in this [facility] from October 24th thru11-01-22 for calling the police reporting suicidal ideations & thoughts of self harm. After treatment/mood stabilizations at the time, he was discharged with a follow-up recommendations at the [place] in [place], [place]. This time around, [name] is admitted to the [facility] from the [facility] hospital with complain of suicidal ideations with plans to shoot himself on the head. He does have hx of other chronic medical issues that includes, DM, HTN, GERD & s/p stent placement last August, 2022.   Recommendations made by Psychiatry team yesterday:  Continue Abilify 10 mg po daily. Continue Duloxetine 30 mg po daily. Continue Vistaril 25 mg po tid prn. Continue Trazodone 50 mg po Q hs prn.  Daily notes: For this assessment, patient is seen in the office on the [place]. Pt is awake, alert and oriented to person, place and situation. Eye contact is good, speech is clear and coherent, mood is euthymic and affect is congruent. Thought content is logical, pt denies SI/HI/AVH, and denies paranoia, and there is no evidence of any delusional thoughts. Concentration is good, and pt reports a good night's sleep, but as per flow sheets, sleep last night was fair at 5.75 hours. Pt reports that he is tolerating his medications well, denies any numbness, tingling, or any other medication related side effects. Pt denies being depressed, denies being anxious, denies any problems with bowel movements and reports a good appetite.  Pt reports that his only concerns that he has to find where to live prior to discharge, and states that he was homeless prior to being admitted to the hospital. Pt then  ruminates about being in a 50+ living environment prior to admission, and states all of the older women there including those who were 64 years old were interested in him. He states that when he got a girlfriend from somewhere else, he was told to leave, and that is how he became homeless. Pt reports that he intends to go to [facility] [facility] apartments" where he has a good rapport with the landlord, and hopefully get an apartment there. Social work has been notified of the need to work on possible safe discharge dispositions for patient, and to provide him with resources for post discharge.  Pt is tolerating medications well, and no medication changes are being done at this time.  Principal Problem: Bipolar affective disorder, depressed, severe ([place])  Diagnosis: Principal Problem:   Bipolar affective disorder, depressed, severe ([place]) Active Problems:   HLD (hyperlipidemia)   Essential hypertension   Diabetes type 2, controlled ([place])   Coronary artery disease   Cocaine abuse ([name])  Total Time spent with patient:  35 minutes  Past Psychiatric History: Bipolar disorder.                                              Cocaine use disorder.  Past Medical History:  Past Medical History:  Diagnosis Date   Anxiety    Arthritis    Cancer ([place])    Cataract    Depression    Diabetes mellitus    GERD (gastroesophageal  reflux disease)    H/O suicide attempt 1997 and 2011   jumped out of moving vehicle and attempted to jump into moving traffic   High cholesterol    Hypertension    Neuropathic pain    Osteoporosis     Past Surgical History:  Procedure Laterality Date   COLONOSCOPY     CORONARY STENT INTERVENTION N/A 10/10/2020   Procedure: CORONARY STENT INTERVENTION;  Surgeon: Jettie Booze, MD;  Location: [name];  Service: Cardiovascular;  Laterality: N/A;   LEFT HEART CATH AND CORONARY ANGIOGRAPHY N/A 10/10/2020   Procedure: LEFT HEART CATH AND CORONARY ANGIOGRAPHY;   Surgeon: Jettie Booze, MD;  Location: [place] [facility];  Service: Cardiovascular;  Laterality: N/A;   NEPHRECTOMY     due to cancer   RIGHT/LEFT HEART CATH AND CORONARY ANGIOGRAPHY N/A 10/09/2020   Procedure: RIGHT/LEFT HEART CATH AND CORONARY ANGIOGRAPHY;  Surgeon: Nelva Bush, MD;  Location: [place] CV LAB;  Service: Cardiovascular;  Laterality: N/A;   Family History:  Family History  Problem Relation Age of Onset   Alcohol abuse Sister    Alcohol abuse Brother    Colon cancer Neg Hx    Esophageal cancer Neg Hx    Pancreatic cancer Neg Hx    Rectal cancer Neg Hx    Stomach cancer Neg Hx    Family Psychiatric  History: See H&P  Social History:  Social History   Substance and Sexual Activity  Alcohol Use Yes   Alcohol/week: 3.0 standard drinks   Types: 3 Cans of beer per week   Comment: Pt has been clean for 8 months     Social History   Substance and Sexual Activity  Drug Use Yes   Types: "Crack" cocaine, Cocaine    Social History   Socioeconomic History   Marital status: Single    Spouse name: Not on file   Number of children: Not on file   Years of education: Not on file   Highest education level: Not on file  Occupational History   Not on file  Tobacco Use   Smoking status: Every Day    Packs/day: 0.50    Years: 20.00    Pack years: 10.00    Types: Cigarettes   Smokeless tobacco: Never  Vaping Use   Vaping Use: Never used  Substance and Sexual Activity   Alcohol use: Yes    Alcohol/week: 3.0 standard drinks    Types: 3 Cans of beer per week    Comment: Pt has been clean for 8 months   Drug use: Yes    Types: "Crack" cocaine, Cocaine   Sexual activity: Yes    Birth control/protection: Condom  Other Topics Concern   Not on file  Social History Narrative   Lives alone.  Has sister in area.   Social Determinants of Health   Financial Resource Strain: Not on file  Food Insecurity: Not on file  Transportation Needs: Not on file   Physical Activity: Not on file  Stress: Not on file  Social Connections: Not on file   Additional Social History:   Sleep: Good  Appetite:  Good  Current Medications: Current Facility-Administered Medications  Medication Dose Route Frequency Provider Last Rate Last Admin   acetaminophen (TYLENOL) tablet 650 mg  650 mg Oral Q6H PRN Lovena Le, Cody W, PA-C       albuterol (VENTOLIN HFA) 108 (90 Base) MCG/ACT inhaler 1-2 puff  1-2 puff Inhalation Q6H PRN Rankin, Shuvon B,  NP       alum & mag hydroxide-simeth (MAALOX/MYLANTA) 200-200-20 MG/5ML suspension 30 mL  30 mL Oral Q4H PRN Lovena Le, Cody W, PA-C       ARIPiprazole (ABILIFY) tablet 10 mg  10 mg Oral Daily Lindell Spar I, NP   10 mg at 01/10/21 0744   aspirin EC tablet 81 mg  81 mg Oral Daily Rankin, Shuvon B, NP   81 mg at 01/10/21 0744   clopidogrel (PLAVIX) tablet 75 mg  75 mg Oral Daily Rankin, Shuvon B, NP   75 mg at 01/10/21 0744   DULoxetine (CYMBALTA) DR capsule 30 mg  30 mg Oral Daily Lindell Spar I, NP   30 mg at 01/10/21 0744   gabapentin (NEURONTIN) capsule 300 mg  300 mg Oral TID Lindell Spar I, NP   300 mg at 01/10/21 1142   hydrOXYzine (ATARAX/VISTARIL) tablet 25 mg  25 mg Oral TID PRN Prescilla Sours, PA-C   25 mg at 01/09/21 2134   magnesium hydroxide (MILK OF MAGNESIA) suspension 30 mL  30 mL Oral Daily PRN Lovena Le, Cody W, PA-C       metFORMIN (GLUCOPHAGE-XR) 24 hr tablet 1,000 mg  1,000 mg Oral Q supper Rankin, Shuvon B, NP   1,000 mg at 01/09/21 1718   metFORMIN (GLUCOPHAGE-XR) 24 hr tablet 500 mg  500 mg Oral Q breakfast Rankin, Shuvon B, NP   500 mg at 01/10/21 0744   metoprolol succinate (TOPROL-XL) 24 hr tablet 25 mg  25 mg Oral Daily Rankin, Shuvon B, NP   25 mg at 01/10/21 0744   multivitamin with minerals tablet 1 tablet  1 tablet Oral Daily PRN Rankin, Shuvon B, NP       nicotine polacrilex (NICORETTE) gum 2 mg  2 mg Oral PRN Rankin, Shuvon B, NP   2 mg at 01/10/21 0753   pantoprazole (PROTONIX) EC tablet 40 mg   40 mg Oral Daily Rankin, Shuvon B, NP   40 mg at 01/10/21 0744   rosuvastatin (CRESTOR) tablet 40 mg  40 mg Oral Daily Rankin, Shuvon B, NP   40 mg at 01/10/21 0743   traZODone (DESYREL) tablet 50 mg  50 mg Oral QHS PRN Rankin, Shuvon B, NP   50 mg at 01/09/21 2134   Lab Results:  Results for orders placed or performed during the hospital encounter of 01/06/21 (from the past 48 hour(s))  Glucose, capillary     Status: Abnormal   Collection Time: 01/09/21  6:02 AM  Result Value Ref Range   Glucose-Capillary 135 (H) 70 - 99 mg/dL    Comment: Glucose reference range applies only to samples taken after fasting for at least 8 hours.  Glucose, capillary     Status: Abnormal   Collection Time: 01/10/21  6:26 AM  Result Value Ref Range   Glucose-Capillary 158 (H) 70 - 99 mg/dL    Comment: Glucose reference range applies only to samples taken after fasting for at least 8 hours.   Blood Alcohol level:  Lab Results  Component Value Date   ETH <10 01/04/2021   ETH <10 16/96/7893   Metabolic Disorder Labs: Lab Results  Component Value Date   HGBA1C 6.8 (H) 12/10/2020   MPG 148.46 12/10/2020   MPG 188.64 10/10/2020   No results found for: PROLACTIN Lab Results  Component Value Date   CHOL 177 12/10/2020   TRIG 124 12/10/2020   HDL 50 12/10/2020   CHOLHDL 3.5 12/10/2020   VLDL 25 12/10/2020  [place] 102 (H) 12/10/2020   [place] 82 10/10/2020   Physical Findings: AIMS: 0 Dental Status Current problems with teeth and/or dentures?: No Does patient usually wear dentures?: No  CIWA:  N/A COWS:  N/A   Musculoskeletal: Strength & Muscle Tone: within normal limits Gait & Station: normal Patient leans: N/A  Psychiatric Specialty Exam:  Presentation  General Appearance: Fairly Groomed  Eye Contact:Good  Speech:Clear and Coherent  Speech Volume:Normal  Handedness:Right   Mood and Affect  Mood:Euthymic  Affect:Appropriate  Thought Process  Thought  Processes:Coherent  Descriptions of Associations:Intact  Orientation:Full (Time, Place and Person)  Thought Content:Logical  History of Schizophrenia/Schizoaffective disorder:No  Duration of Psychotic Symptoms:N/A  Hallucinations:Hallucinations: None  Ideas of Reference:None  Suicidal Thoughts:Suicidal Thoughts: No  Homicidal Thoughts:Homicidal Thoughts: No  Sensorium  Memory:Immediate Good  Judgment:Fair  Insight:Fair   Executive Functions  Concentration:Good  Attention Span:Good  [place] of Knowledge:Good  Language:Good   Psychomotor Activity  Psychomotor Activity:Psychomotor Activity: Normal  Assets  Assets:Desire for Improvement; Financial Resources/Insurance   Sleep  Sleep:Sleep: Fair Number of Hours of Sleep: 5.75  Physical Exam: Physical Exam Vitals and nursing note reviewed.  HENT:     Mouth/Throat:     Pharynx: Oropharynx is clear.  Eyes:     Pupils: Pupils are equal, round, and reactive to light.  Cardiovascular:     Rate and Rhythm: Normal rate.     Pulses: Normal pulses.  Pulmonary:     Effort: Pulmonary effort is normal.  Genitourinary:    Comments: Deferred Musculoskeletal:        General: Normal range of motion.     Cervical back: Normal range of motion.  Skin:    General: Skin is warm and dry.  Neurological:     General: No focal deficit present.     Mental Status: He is alert and oriented to person, place, and time.   Review of Systems  Constitutional:  Negative for chills and fever.  HENT:  Negative for congestion and sore throat.   Eyes:  Negative for blurred vision.  Respiratory:  Negative for cough, shortness of breath and wheezing.   Cardiovascular:  Negative for chest pain and palpitations.  Gastrointestinal:  Negative for abdominal pain, constipation, diarrhea, heartburn, nausea and vomiting.  Genitourinary:  Negative for dysuria.  Musculoskeletal:  Negative for joint pain and myalgias.   Neurological:  Negative for dizziness, tingling, tremors, sensory change, speech change, focal weakness, seizures, loss of consciousness, weakness and headaches.  Endo/Heme/Allergies:        Allergies: NKDA  Psychiatric/Behavioral:  Positive for substance abuse (Hx. cocaine use disorder). Negative for depression, hallucinations, memory loss and suicidal ideas. The patient is not nervous/anxious and does not have insomnia.   Blood pressure 125/79, pulse (!) 111, temperature 98.3 F (36.8 C), temperature source Oral, resp. rate 20, height 5\' 9"  (1.753 m), weight 77.6 kg, SpO2 99 %. Body mass index is 25.25 kg/m.  Treatment Plan Summary: Daily contact with patient to assess and evaluate symptoms and progress in treatment and Medication management.   Continue inpatient hospitalization.  Will continue today 01/10/2021 plan as below except where it is noted.   2. Medication management to reduce current symptoms to base line and improve the patient's overall level of functioning: See [facility] for plan of care.    Bipolar depression.  Continue Abilify 10 mg po daily.  Continue Duloxetine 30 mg po daily.   Anxiety. Continue Vistaril 25 mg po tid prn.   Insomnia. Continue  Trazodone 50 mg po Q hs prn.   Other medical issues, continue:  Albuterol 1-2 puff Q 6 hrs prn for SOB. ASA 81 mg po daily for heart health. Plavix 75 mg po daily for CAD.  Gabapentin 300 mg po tid for agitation/neuropathic pain. Metformin XR 500 mg po daily for DM.  Metoprolol 25 mg po daily for HTN.  Nicorette gum 2 mg prn for smoking cessation. Protonix 40 mg po daily (am) for GERD.  Crestor 40 mg po daily for high cholesterol.   Other prn medications for other medical complaints. Acetaminophen 650 mg po Q 6 hrs prn for pain/fever. Mylanta 30 ml po Q 4 hrs prn for indigestion.  MOM 30 ml po Q daily for constipation. Encourage group participation.   Discharge Planning:              -- Social work and case  management to assist with discharge planning and identification of hospital follow-up needs prior to discharge             -- Estimated LOS: 5-7 days             -- Discharge Concerns: Need to establish a safety plan; Medication compliance and effectiveness             -- Discharge Goals: Return home with outpatient referrals for mental health follow-up including medication management and psychotherapy.   Nicholes Rough, NP, pmhnp-bc 01/10/2021, 3:30 PM

## 2021-01-10 NOTE — Group Note (Signed)
LCSW Group Therapy Note  01/10/2021    10:00-11:00am   Type of Therapy and Topic:  Group Therapy: Early Messages Received About Anger  Participation Level:  Active   Description of Group:   In this group, patients shared and discussed the early messages received in their lives about anger through parental or other adult modeling, teaching, repression, punishment, violence, and more.  Participants identified how those childhood lessons influence even now how they usually or often react when angered.  The group discussed that anger is a secondary emotion and what may be the underlying emotional themes that come out through anger outbursts or that are ignored through anger suppression.    Therapeutic Goals: Patients will identify one or more childhood message about anger that they received and how it was taught to them. Patients will discuss how these childhood experiences have influenced and continue to influence their own expression or repression of anger even today. Patients will explore possible primary emotions that tend to fuel their secondary emotion of anger. Patients will learn that anger itself is normal and cannot be eliminated, and that healthier coping skills can assist with resolving conflict rather than worsening situations.  Summary of Patient Progress:  The patient shared that his childhood lessons about anger were that parents fight and show aggression.  As a result, he developed a lot of resentment once his parents divorced and treated him differently.  The patient participated fully and demonstrated insight.  Therapeutic Modalities:   Cognitive Behavioral Therapy Motivation Interviewing   Baird Kay, [place] 01/10/2021  12:18 PM

## 2021-01-10 NOTE — [facility] Group Notes (Signed)
The focus of this group is to educate the patient on the purpose and policies of crisis stabilization and provide a format to answer questions about their admission.  The group details unit policies and expectations of patients while admitted.  Pt attended and participated in morning orientation group.

## 2021-01-10 NOTE — B[facility] Group Notes (Signed)
.  Psychoeducational Group Note  Date: 01/10/2021 Time: 0900-1000    Goal Setting   Purpose of Group: This group helps to provide patients with the steps of setting a goal that is specific, measurable, attainable, realistic and time specific. A discussion on how we keep ourselves stuck with negative self talk. Homework given for Patients to write 30 positive attributes about themselves.    Participation Level:  Did not attend   Paulino Rily

## 2021-01-10 NOTE — Progress Notes (Signed)
Pt denies SI/HI/AVH and verbally agrees to approach staff if these become apparent or before harming themselves/others. Rates depression 0/10. Rates anxiety 0/10. Rates pain 0/10. Pt stated his goal is to stay thinking positive. Pt stated that he was doing great and has no complaints. Scheduled medications administered to Pt, per MD orders. RN provided support and encouragement to Pt. Q15 min safety checks implemented and continued. Pt safe on the unit. RN will continue to monitor and intervene as needed.   01/10/21 0744  Psych Admission Type (Psych Patients Only)  Admission Status Voluntary  Psychosocial Assessment  Patient Complaints None  Eye Contact Fair  Facial Expression Animated  Affect Appropriate to circumstance  Speech Logical/coherent  Interaction Assertive  Motor Activity Slow  Appearance/Hygiene Unremarkable  Behavior Characteristics Cooperative;Calm;Appropriate to situation  Mood Pleasant;Euthymic  Aggressive Behavior  Effect No apparent injury  Thought Process  Coherency WDL  Content WDL  Delusions None reported or observed  Perception WDL  Hallucination None reported or observed  Judgment Impaired  Confusion None  Danger to Self  Current suicidal ideation? Denies  Danger to Others  Danger to Others None reported or observed

## 2021-01-11 LAB — GLUCOSE, CAPILLARY: Glucose-Capillary: 148 mg/dL — ABNORMAL HIGH (ref 70–99)

## 2021-01-11 NOTE — [facility] Group Notes (Signed)
Psychoeducational Group Note  Date:  01/04/2021 Time:  1300-1400   Group Topic/Focus: This is a continuation of the group from Saturday. Pt's have been asked to formulate a list of 30 positives about themselves. This list is to be read 2 times a day for 30 days, looking in a mirror. Changing patterns of negative self talk. Also discussed is the fact that there have been some people who hurt [place] in the past. We keep that memory alive within [place]. Ways to cope with this are discused   Participation Level:  Did not attend [name]

## 2021-01-11 NOTE — Group Note (Signed)
[place] LCSW Group Therapy Note  Date/Time:  01/11/2021 10:00-11:00AM  Type of Therapy and Topic:  Group Therapy:  Healthy and Unhealthy Supports plus being "My Own Hero"  Participation Level:  Active   Description of Group:    [employer], LCSWA, led group.  Patients in this group were invited to identify the differences between healthy and unhealthy supports and then to identify those people in their lives who fall into one category or the other, as well as why.  They were then introduced to the idea of adding more healthy supports and decreasing the unhealthy ones.  Patients discussed what additional healthy supports could be helpful in their recovery and wellness after discharge in order to maintain stability.   An emphasis was placed on using counselor, doctor, therapy groups, 12-step groups, and problem-specific support groups to expand supports.  The song "My Own Hero" was played to encourage full participation in their own recovery journey instead of expecting others to do all the work for them.  The song "I Know Where I've Been" was played as further encouragement that they are further in their journey than they were previously.  Therapeutic Goals:   1)  discuss importance of adding supports to stay well once out of the hospital  2)  compare healthy versus unhealthy supports and identify some examples of each  3)  generate ideas and descriptions of healthy supports that can be added  4)  offer mutual support about how to address unhealthy supports  5)  encourage active participation in and adherence to discharge plan    Summary of Patient Progress:  The patient stated that current healthy supports in his life are himself when he does the right thing which includes advocating for himself, while current unhealthy supports include his sister who tells him what to do and wants to control his money.  The patient expressed a willingness to add appropriate support(s) to help in his  recovery journey.   Therapeutic Modalities:   Motivational Interviewing Brief Solution-Focused Therapy  Selmer [place], LCSW

## 2021-01-11 NOTE — Progress Notes (Signed)
[facility] MD Progress Note  01/11/2021 1:57 PM [name]  MRN:  [id number]  Reason for admission: 65 year old AA male with hx of mental health issues, cocaine/THC use disorders, multiple psychiatric admissions & noncompliant to his mental health treatment recommendations. [name] was a recent patient in this [facility] from October 24th thru11-01-22 for calling the police reporting suicidal ideations & thoughts of self harm. After treatment/mood stabilizations at the time, he was discharged with a follow-up recommendations at the [place] in [place], [place]. This time around, [name] is admitted to the [facility] from the [facility] hospital with complain of suicidal ideations with plans to shoot himself on the head. He does have hx of other chronic medical issues that includes, DM, HTN, GERD & s/p stent placement last August, 2022.   Recommendations made by Psychiatry team yesterday:  Continue Abilify 10 mg po daily. Continue Duloxetine 30 mg po daily. Continue Vistaril 25 mg po tid prn. Continue Trazodone 50 mg po Q hs prn.  Daily notes: Patient is seen, chart reviewed and case discussed with the treatment team. Patient is awake, alert and oriented to person, place and situation. Eye contact is good, speech is clear and coherent, mood is euthymic and affect is congruent. Thought content is logical. Patient denies SI/HI/AVH, paranoia, and delusional thoughts. He reported  a great night's sleep, record reflects he slept 5.75 hours. Pt reports that he is tolerating his medications well and denies any side effects. He stated "I feel great, I just need to get to a place to live." Pt denies being depressed, denies being anxious. He reported a good appetite.  Pt reports that his only concerns that he has to find where to live prior to discharge, and states that he was homeless prior to being admitted to the hospital. Patient stated he has called the [facility] and may be able to go there. He also said he can  call another [facility] in [name] if the first one does not have a bed. He stated he will call the [name] too. Social work has been notified of the need to work on possible safe discharge dispositions for patient, and to provide him with resources for post discharge. He is calm and cooperative. He is attending group sessions and is visible on the unit. There are no medication changes today.   Principal Problem: Bipolar affective disorder, depressed, severe ([name])  Diagnosis: Principal Problem:   Bipolar affective disorder, depressed, severe ([name]) Active Problems:   HLD (hyperlipidemia)   Essential hypertension   Diabetes type 2, controlled ([name])   Coronary artery disease   Cocaine abuse ([name])  Total Time spent with patient:  25 minutes  Past Psychiatric History: Bipolar disorder.                                              Cocaine use disorder.  Past Medical History:  Past Medical History:  Diagnosis Date   Anxiety    Arthritis    Cancer ([place])    Cataract    Depression    Diabetes mellitus    GERD (gastroesophageal reflux disease)    H/O suicide attempt 1997 and 2011   jumped out of moving vehicle and attempted to jump into moving traffic   High cholesterol    Hypertension    Neuropathic pain    Osteoporosis  Past Surgical History:  Procedure Laterality Date   COLONOSCOPY     CORONARY STENT INTERVENTION N/A 10/10/2020   Procedure: CORONARY STENT INTERVENTION;  Surgeon: Jettie Booze, MD;  Location: [place];  Service: Cardiovascular;  Laterality: N/A;   LEFT HEART CATH AND CORONARY ANGIOGRAPHY N/A 10/10/2020   Procedure: LEFT HEART CATH AND CORONARY ANGIOGRAPHY;  Surgeon: Jettie Booze, MD;  Location: [place] [facility];  Service: Cardiovascular;  Laterality: N/A;   NEPHRECTOMY     due to cancer   RIGHT/LEFT HEART CATH AND CORONARY ANGIOGRAPHY N/A 10/09/2020   Procedure: RIGHT/LEFT HEART CATH AND CORONARY ANGIOGRAPHY;  Surgeon: Nelva Bush, MD;  Location: [name];  Service: Cardiovascular;  Laterality: N/A;   Family History:  Family History  Problem Relation Age of Onset   Alcohol abuse Sister    Alcohol abuse Brother    Colon cancer Neg Hx    Esophageal cancer Neg Hx    Pancreatic cancer Neg Hx    Rectal cancer Neg Hx    Stomach cancer Neg Hx    Family Psychiatric  History: See H&P  Social History:  Social History   Substance and Sexual Activity  Alcohol Use Yes   Alcohol/week: 3.0 standard drinks   Types: 3 Cans of beer per week   Comment: Pt has been clean for 8 months     Social History   Substance and Sexual Activity  Drug Use Yes   Types: "Crack" cocaine, Cocaine    Social History   Socioeconomic History   Marital status: Single    Spouse name: Not on file   Number of children: Not on file   Years of education: Not on file   Highest education level: Not on file  Occupational History   Not on file  Tobacco Use   Smoking status: Every Day    Packs/day: 0.50    Years: 20.00    Pack years: 10.00    Types: Cigarettes   Smokeless tobacco: Never  Vaping Use   Vaping Use: Never used  Substance and Sexual Activity   Alcohol use: Yes    Alcohol/week: 3.0 standard drinks    Types: 3 Cans of beer per week    Comment: Pt has been clean for 8 months   Drug use: Yes    Types: "Crack" cocaine, Cocaine   Sexual activity: Yes    Birth control/protection: Condom  Other Topics Concern   Not on file  Social History Narrative   Lives alone.  Has sister in area.   Social Determinants of Health   Financial Resource Strain: Not on file  Food Insecurity: Not on file  Transportation Needs: Not on file  Physical Activity: Not on file  Stress: Not on file  Social Connections: Not on file   Additional Social History:   Sleep: Good  Appetite:  Good  Current Medications: Current Facility-Administered Medications  Medication Dose Route Frequency Provider Last Rate Last Admin    acetaminophen (TYLENOL) tablet 650 mg  650 mg Oral Q6H PRN Lovena Le, Cody W, PA-C       albuterol (VENTOLIN HFA) 108 (90 Base) MCG/ACT inhaler 1-2 puff  1-2 puff Inhalation Q6H PRN Rankin, Shuvon B, NP       alum & mag hydroxide-simeth (MAALOX/MYLANTA) 200-200-20 MG/5ML suspension 30 mL  30 mL Oral Q4H PRN Lovena Le, Cody W, PA-C       ARIPiprazole (ABILIFY) tablet 10 mg  10 mg Oral Daily Lindell Spar I,  NP   10 mg at 01/11/21 0756   aspirin EC tablet 81 mg  81 mg Oral Daily Rankin, Shuvon B, NP   81 mg at 01/11/21 0756   clopidogrel (PLAVIX) tablet 75 mg  75 mg Oral Daily Rankin, Shuvon B, NP   75 mg at 01/11/21 0756   DULoxetine (CYMBALTA) DR capsule 30 mg  30 mg Oral Daily Lindell Spar I, NP   30 mg at 01/11/21 0756   gabapentin (NEURONTIN) capsule 300 mg  300 mg Oral TID Lindell Spar I, NP   300 mg at 01/11/21 1111   hydrOXYzine (ATARAX/VISTARIL) tablet 25 mg  25 mg Oral TID PRN Prescilla Sours, PA-C   25 mg at 01/10/21 2119   magnesium hydroxide (MILK OF MAGNESIA) suspension 30 mL  30 mL Oral Daily PRN Lovena Le, Cody W, PA-C       metFORMIN (GLUCOPHAGE-XR) 24 hr tablet 1,000 mg  1,000 mg Oral Q supper Rankin, Shuvon B, NP   1,000 mg at 01/10/21 1652   metFORMIN (GLUCOPHAGE-XR) 24 hr tablet 500 mg  500 mg Oral Q breakfast Rankin, Shuvon B, NP   500 mg at 01/11/21 0755   metoprolol succinate (TOPROL-XL) 24 hr tablet 25 mg  25 mg Oral Daily Rankin, Shuvon B, NP   25 mg at 01/11/21 0756   multivitamin with minerals tablet 1 tablet  1 tablet Oral Daily PRN Rankin, Shuvon B, NP       nicotine polacrilex (NICORETTE) gum 2 mg  2 mg Oral PRN Rankin, Shuvon B, NP   2 mg at 01/11/21 1112   pantoprazole (PROTONIX) EC tablet 40 mg  40 mg Oral Daily Rankin, Shuvon B, NP   40 mg at 01/11/21 0756   rosuvastatin (CRESTOR) tablet 40 mg  40 mg Oral Daily Rankin, Shuvon B, NP   40 mg at 01/11/21 0756   traZODone (DESYREL) tablet 50 mg  50 mg Oral QHS PRN Rankin, Shuvon B, NP   50 mg at 01/10/21 2118   Lab Results:   Results for orders placed or performed during the hospital encounter of 01/06/21 (from the past 48 hour(s))  Glucose, capillary     Status: Abnormal   Collection Time: 01/10/21  6:26 AM  Result Value Ref Range   Glucose-Capillary 158 (H) 70 - 99 mg/dL    Comment: Glucose reference range applies only to samples taken after fasting for at least 8 hours.  Glucose, capillary     Status: Abnormal   Collection Time: 01/11/21  5:49 AM  Result Value Ref Range   Glucose-Capillary 148 (H) 70 - 99 mg/dL    Comment: Glucose reference range applies only to samples taken after fasting for at least 8 hours.   Comment 1 Notify RN    Comment 2 Document in Chart    Blood Alcohol level:  Lab Results  Component Value Date   ETH <10 01/04/2021   ETH <10 53/61/4431   Metabolic Disorder Labs: Lab Results  Component Value Date   HGBA1C 6.8 (H) 12/10/2020   MPG 148.46 12/10/2020   MPG 188.64 10/10/2020   No results found for: PROLACTIN Lab Results  Component Value Date   CHOL 177 12/10/2020   TRIG 124 12/10/2020   HDL 50 12/10/2020   CHOLHDL 3.5 12/10/2020   VLDL 25 12/10/2020   LDLCALC 102 (H) 12/10/2020   LDLCALC 82 10/10/2020   Physical Findings: AIMS: 0 Dental Status Current problems with teeth and/or dentures?: No Does patient usually wear dentures?:  No  CIWA:  N/A COWS:  N/A   Musculoskeletal: Strength & Muscle Tone: within normal limits Gait & Station: normal Patient leans: N/A  Psychiatric Specialty Exam:  Presentation  General Appearance: Fairly Groomed; Appropriate for Environment  Eye Contact:Good  Speech:Clear and Coherent  Speech Volume:Normal  Handedness:Right   Mood and Affect  Mood:Euthymic  Affect:Appropriate  Thought Process  Thought Processes:Coherent  Descriptions of Associations:Intact  Orientation:Full (Time, Place and Person)  Thought Content:Logical  History of Schizophrenia/Schizoaffective disorder:No  Duration of Psychotic  Symptoms:N/A  Hallucinations:Hallucinations: None  Ideas of Reference:None  Suicidal Thoughts:Suicidal Thoughts: No  Homicidal Thoughts:Homicidal Thoughts: No  Sensorium  Memory:Immediate Good  Judgment:Fair  Insight:Fair   Executive Functions  Concentration:Good  Attention Span:Good  [place] of Knowledge:Good  Language:Good   Psychomotor Activity  Psychomotor Activity:Psychomotor Activity: Normal  Assets  Assets:Desire for Improvement; Financial Resources/Insurance   Sleep  Sleep:Sleep: Fair Number of Hours of Sleep: 5.25  Physical Exam: Physical Exam Vitals and nursing note reviewed.  HENT:     Mouth/Throat:     Pharynx: Oropharynx is clear.  Eyes:     Pupils: Pupils are equal, round, and reactive to light.  Cardiovascular:     Rate and Rhythm: Normal rate.     Pulses: Normal pulses.  Pulmonary:     Effort: Pulmonary effort is normal.  Genitourinary:    Comments: Deferred Musculoskeletal:        General: Normal range of motion.     Cervical back: Normal range of motion.  Skin:    General: Skin is warm and dry.  Neurological:     General: No focal deficit present.     Mental Status: He is alert and oriented to person, place, and time.   Review of Systems  Constitutional:  Negative for chills and fever.  HENT:  Negative for congestion and sore throat.   Eyes:  Negative for blurred vision.  Respiratory:  Negative for cough, shortness of breath and wheezing.   Cardiovascular:  Negative for chest pain and palpitations.  Gastrointestinal:  Negative for abdominal pain, constipation, diarrhea, heartburn, nausea and vomiting.  Genitourinary:  Negative for dysuria.  Musculoskeletal:  Negative for joint pain and myalgias.  Neurological:  Negative for dizziness, tingling, tremors, sensory change, speech change, focal weakness, seizures, loss of consciousness, weakness and headaches.  Endo/Heme/Allergies:        Allergies: NKDA   Psychiatric/Behavioral:  Positive for substance abuse (Hx. cocaine use disorder). Negative for depression, hallucinations, memory loss and suicidal ideas. The patient is not nervous/anxious and does not have insomnia.   Blood pressure (!) 141/78, pulse (!) 115, temperature 97.8 F (36.6 C), temperature source Oral, resp. rate 16, height 5\' 9"  (1.753 m), weight 77.6 kg, SpO2 98 %. Body mass index is 25.25 kg/m.  Treatment Plan Summary: Daily contact with patient to assess and evaluate symptoms and progress in treatment and Medication management.   Continue inpatient hospitalization.  Will continue today 01/11/2021 plan as below except where it is noted.   2. Medication management to reduce current symptoms to base line and improve the patient's overall level of functioning: See [facility] for plan of care.    Bipolar depression.  Continue Abilify 10 mg po daily.  Continue Duloxetine 30 mg po daily.   Anxiety. Continue Vistaril 25 mg po tid prn.   Insomnia. Continue Trazodone 50 mg po Q hs prn.   Other medical issues, continue:  Albuterol 1-2 puff Q 6 hrs prn for SOB. ASA 81  mg po daily for heart health. Plavix 75 mg po daily for CAD.  Gabapentin 300 mg po tid for agitation/neuropathic pain. Metformin XR 500 mg po daily for DM.  Metoprolol 25 mg po daily for HTN.  Nicorette gum 2 mg prn for smoking cessation. Protonix 40 mg po daily (am) for GERD.  Crestor 40 mg po daily for high cholesterol.   Other prn medications for other medical complaints. Acetaminophen 650 mg po Q 6 hrs prn for pain/fever. Mylanta 30 ml po Q 4 hrs prn for indigestion.  MOM 30 ml po Q daily for constipation. Encourage group participation.   Discharge Planning:              -- Social work and case management to assist with discharge planning and identification of hospital follow-up needs prior to discharge             -- Estimated LOS: 5-7 days             -- Discharge Concerns: Need to establish a safety  plan; Medication compliance and effectiveness             -- Discharge Goals: Return home with outpatient referrals for mental health follow-up including medication management and psychotherapy.   Ethelene Hal, NP 01/11/2021, 1:57 PM

## 2021-01-11 NOTE — BHH Counselor (Signed)
Clinical Social Work Note  Patient reports that he has been accepted to [facility]) for Monday 11/28.  Selmer Dominion, LCSW 01/11/2021, 3:21 PM

## 2021-01-11 NOTE — Progress Notes (Signed)
Pt denies SI/HI/AVH and verbally agrees to approach staff if these become apparent or before harming themselves/others. Rates depression 0/10. Rates anxiety 0/10. Rates pain 0/10. Pt called [facility] around 1500 and found out that he got a bed. Pt was very excited that he was able to get the bed. Pt is has been interacting well with others and has been in a good mood. Scheduled medications administered to Pt, per MD orders. RN provided support and encouragement to Pt. Q15 min safety checks implemented and continued. Pt safe on the unit. RN will continue to monitor and intervene as needed.   01/11/21 0756  Psych Admission Type (Psych Patients Only)  Admission Status Voluntary  Psychosocial Assessment  Patient Complaints None  Eye Contact Fair  Facial Expression Animated  Affect Appropriate to circumstance  Speech Logical/coherent  Interaction Assertive  Motor Activity Slow  Appearance/Hygiene Unremarkable  Behavior Characteristics Appropriate to situation;Calm;Cooperative  Mood Pleasant;Euthymic  Thought Process  Coherency WDL  Content WDL  Delusions None reported or observed  Perception WDL  Hallucination None reported or observed  Judgment Impaired  Confusion None  Danger to Self  Current suicidal ideation? Denies  Self-Injurious Behavior No self-injurious ideation or behavior indicators observed or expressed   Agreement Not to Harm Self Yes  Description of Agreement Verbal contract for safety  Danger to Others  Danger to Others None reported or observed

## 2021-01-11 NOTE — BHH Group Notes (Signed)
Adult Psychoeducational Group Not Date:  01/11/2021 Time:  0900-1045 Group Topic/Focus: PROGRESSIVE RELAXATION. A group where deep breathing is taught and tensing and relaxation muscle groups is used. Imagery is used as well.  Pts are asked to imagine 3 pillars that hold them up when they are not able to hold themselves up.  Participation Level:  Active  Participation Quality:  Appropriate  Affect:  Appropriate  Cognitive:  Oriented  Insight: Improving  Engagement in Group:  Engaged  Modes of Intervention:  Activity, Discussion, Education, and Support  Additional Comments:  Pt/ rates his energy at a 6/10. States what holds him up is God, his mother and his great up bringing.  Paulino Rily

## 2021-01-11 NOTE — Progress Notes (Signed)
   01/11/21 2234  Psych Admission Type (Psych Patients Only)  Admission Status Voluntary  Psychosocial Assessment  Patient Complaints None  Eye Contact Fair  Facial Expression Animated  Affect Appropriate to circumstance  Speech Logical/coherent  Interaction Assertive  Motor Activity Slow  Appearance/Hygiene Unremarkable  Behavior Characteristics Appropriate to situation  Mood Pleasant  Thought Process  Coherency WDL  Content WDL  Delusions None reported or observed  Perception WDL  Hallucination None reported or observed  Judgment Impaired  Confusion None  Danger to Self  Current suicidal ideation? Denies  Self-Injurious Behavior No self-injurious ideation or behavior indicators observed or expressed   Agreement Not to Harm Self Yes  Description of Agreement Verbal contract for safety  Danger to Others  Danger to Others None reported or observed

## 2021-01-11 NOTE — Progress Notes (Signed)
[facility] Notes:  (Nursing/MHT/Case Management/Adjunct)  Date:  01/11/2021  Time:  2015  Type of Therapy:   wrap up group  Participation Level:  Active  Participation Quality:  Appropriate, Attentive, Sharing, and Supportive  Affect:  Appropriate  Cognitive:  Alert  Insight:  Improving  Engagement in Group:  Engaged  Modes of Intervention:  Clarification, Socialization, and Support  Summary of Progress/Problems: Positive thinking and self-care were discussed.   Shellia Cleverly 01/11/2021, 8:58 PM

## 2021-01-12 ENCOUNTER — Encounter (HOSPITAL_COMMUNITY): Payer: Self-pay

## 2021-01-12 LAB — GLUCOSE, CAPILLARY: Glucose-Capillary: 126 mg/dL — ABNORMAL HIGH (ref 70–99)

## 2021-01-12 MED ORDER — HYDROXYZINE HCL 25 MG PO TABS: 25.0000 mg | ORAL_TABLET | Freq: Three times a day (TID) | ORAL | 0 refills | Status: DC | PRN

## 2021-01-12 MED ORDER — METOPROLOL SUCCINATE ER 25 MG PO TB24: 25.0000 mg | ORAL_TABLET | Freq: Every day | ORAL | 0 refills | Status: DC

## 2021-01-12 MED ORDER — ROSUVASTATIN CALCIUM 40 MG PO TABS: 40.0000 mg | ORAL_TABLET | Freq: Every day | ORAL | 0 refills | Status: DC

## 2021-01-12 MED ORDER — PANTOPRAZOLE SODIUM 40 MG PO TBEC: 40.0000 mg | DELAYED_RELEASE_TABLET | Freq: Every day | ORAL | 0 refills | Status: DC

## 2021-01-12 MED ORDER — ARIPIPRAZOLE 10 MG PO TABS: 10.0000 mg | ORAL_TABLET | Freq: Every day | ORAL | 0 refills | Status: DC

## 2021-01-12 MED ORDER — METFORMIN HCL ER (OSM) 1000 MG PO TB24: 1000.0000 mg | ORAL_TABLET | Freq: Every day | ORAL | 0 refills | Status: DC

## 2021-01-12 MED ORDER — CLOPIDOGREL BISULFATE 75 MG PO TABS: 75.0000 mg | ORAL_TABLET | Freq: Every day | ORAL | 0 refills | Status: DC

## 2021-01-12 MED ORDER — GABAPENTIN 300 MG PO CAPS: 300.0000 mg | ORAL_CAPSULE | Freq: Three times a day (TID) | ORAL | 0 refills | Status: DC

## 2021-01-12 MED ORDER — METFORMIN HCL ER 500 MG PO TB24: 500.0000 mg | ORAL_TABLET | Freq: Every day | ORAL | 0 refills | Status: DC

## 2021-01-12 MED ORDER — ASPIRIN 81 MG PO TBEC: 81.0000 mg | DELAYED_RELEASE_TABLET | Freq: Every day | ORAL | 0 refills | Status: DC

## 2021-01-12 MED ORDER — DULOXETINE HCL 30 MG PO CPEP: 30.0000 mg | ORAL_CAPSULE | Freq: Every day | ORAL | 0 refills | Status: DC
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
## 2021-01-12 NOTE — Group Note (Signed)
Recreation Therapy Group Note   Group Topic:Stress Management  Group Date: 01/12/2021 Start Time: 0935 End Time: 0955 Facilitators: Victorino Sparrow, LRT,CTRS Location: [address] Dayroom  Goal Area(s) Addresses:  Patient will identify members of their support system. Patient will acknowledge benefit of healthy supports in daily life. Patient will identify any negative relationships in their support system and discuss alternatives.  Patient will verbalize positive effect of healthy supports post d/c.   Group Description:  Meditation.  LRT played a meditation that focused on releasing anxiety through slow controlled breathing, being able to acknowledge thoughts and feelings without acting on them and releasing things beyond your control.     Affect/Mood: Appropriate   Participation Level: Active   Participation Quality: Independent   Behavior: Appropriate   Speech/Thought Process: Focused   Insight: Good   Judgement: Good   Modes of Intervention: Meditation, Nature Sounds   Patient Response to Interventions:  Attentive   Education Outcome:  Acknowledges education and In group clarification offered    Clinical Observations/Individualized Feedback: Pt attended and participated in activity.    Plan: Continue to engage patient in RT group sessions 2-3x/week.   Victorino Sparrow, LRT,CTRS 01/12/2021 1:02 PM

## 2021-01-12 NOTE — Progress Notes (Signed)
Discharge Note:  Patient discharged to hotel where she had been staying.  Denied SI and HI.  Denied A/V hallucination.  Suicide prevention information given and discussed with patient who stated she understood and had no questions.  Patient stated she received all her belongings, clothing, toiletries, misc items.

## 2021-01-12 NOTE — Plan of Care (Signed)
Nurse discussed anxiety, depression and coping skills with patient.  

## 2021-01-12 NOTE — [facility] Group Notes (Signed)
Spiritual care group on grief and loss facilitated by chaplain Janne Napoleon, [facility]   Group Goal:   Support / Education around grief and loss   Members engage in facilitated group support and psycho-social education.   Group Description:   Following introductions and group rules, group members engaged in facilitated group dialog and support around topic of loss, with particular support around experiences of loss in their lives. Group Identified types of loss (relationships / self / things) and identified patterns, circumstances, and changes that precipitate losses. Reflected on thoughts / feelings around loss, normalized grief responses, and recognized variety in grief experience. Group noted Worden's four tasks of grief in discussion.   Group drew on Adlerian / Rogerian, narrative, MI,   Patient Progress: [name] attended group and participated and engaged in conversation.  He was supportive of peers and had a good rapport with them.  He was discharged partway through group.  [place], [place] Pager, [phone] 9:54 PM

## 2021-01-12 NOTE — Discharge Summary (Signed)
Physician Discharge Summary Note  Patient:  [name] is an 65 y.o., male MRN:  [id number] DOB:  1955/03/10 Patient phone:  [phone] (home)  Patient address:   [place] [address],  Total Time spent with patient: 30 minutes  Date of Admission:  01/06/2021 Date of Discharge: 01/12/2021  Reason for Admission: Suicidal ideation with a plan to use a gun to shoot himself in the head.  Principal Problem: Bipolar affective disorder, depressed, severe ([place]) Discharge Diagnoses: Principal Problem:   Bipolar affective disorder, depressed, severe ([place]) Active Problems:   HLD (hyperlipidemia)   Essential hypertension   Diabetes type 2, controlled ([place])   Coronary artery disease   Cocaine abuse ([place])  Past Psychiatric History: Anxiety, MDD, Cocaine use disorder  Past Medical History:  Past Medical History:  Diagnosis Date   Anxiety    Arthritis    Cancer ([place])    Cataract    Depression    Diabetes mellitus    GERD (gastroesophageal reflux disease)    H/O suicide attempt 1997 and 2011   jumped out of moving vehicle and attempted to jump into moving traffic   High cholesterol    Hypertension    Neuropathic pain    Osteoporosis     Past Surgical History:  Procedure Laterality Date   COLONOSCOPY     CORONARY STENT INTERVENTION N/A 10/10/2020   Procedure: CORONARY STENT INTERVENTION;  Surgeon: Kevin Booze, MD;  Location: [facility];  Service: Cardiovascular;  Laterality: N/A;   LEFT HEART CATH AND CORONARY ANGIOGRAPHY N/A 10/10/2020   Procedure: LEFT HEART CATH AND CORONARY ANGIOGRAPHY;  Surgeon: Kevin Booze, MD;  Location: [facility];  Service: Cardiovascular;  Laterality: N/A;   NEPHRECTOMY     due to cancer   RIGHT/LEFT HEART CATH AND CORONARY ANGIOGRAPHY N/A 10/09/2020   Procedure: RIGHT/LEFT HEART CATH AND CORONARY ANGIOGRAPHY;  Surgeon: Kevin Bush, MD;  Location: [facility];  Service: Cardiovascular;   Laterality: N/A;   Family History:  Family History  Problem Relation Age of Onset   Alcohol abuse Sister    Alcohol abuse Brother    Colon cancer Neg Hx    Esophageal cancer Neg Hx    Pancreatic cancer Neg Hx    Rectal cancer Neg Hx    Stomach cancer Neg Hx    Family Psychiatric  History: Please see above Social History:  Social History   Substance and Sexual Activity  Alcohol Use Yes   Alcohol/week: 3.0 standard drinks   Types: 3 Cans of beer per week   Comment: Pt has been clean for 8 months     Social History   Substance and Sexual Activity  Drug Use Yes   Types: "Crack" cocaine, Cocaine    Social History   Socioeconomic History   Marital status: Single    Spouse name: Not on file   Number of children: Not on file   Years of education: Not on file   Highest education level: Not on file  Occupational History   Not on file  Tobacco Use   Smoking status: Every Day    Packs/day: 0.50    Years: 20.00    Pack years: 10.00    Types: Cigarettes   Smokeless tobacco: Never  Vaping Use   Vaping Use: Never used  Substance and Sexual Activity   Alcohol use: Yes    Alcohol/week: 3.0 standard drinks    Types: 3 Cans of beer per week  Comment: Pt has been clean for 8 months   Drug use: Yes    Types: "Crack" cocaine, Cocaine   Sexual activity: Yes    Birth control/protection: Condom  Other Topics Concern   Not on file  Social History Narrative   Lives alone.  Has sister in area.   Social Determinants of Health   Financial Resource Strain: Not on file  Food Insecurity: Not on file  Transportation Needs: Not on file  Physical Activity: Not on file  Stress: Not on file  Social Connections: Not on file    Hospital Course:  As per H & P on 01/07/2021: "This is one of several psychiatric admissions in this [facility] for this 65 year old AA male with hx of mental health issues, cocaine/THC use disorders, multiple psychiatric admissions & noncompliant to his mental  health treatment recommendations. [name] was a recent patient in this [facility] from October 24th thru11-01-22 for calling the police reporting suicidal ideations & thoughts of self harm. After treatment/mood stabilizations at the time, he was discharged with a follow-up recommendations at the [place] in [place], [place]. This time around, [name] is admitted to the [facility] from the [facility] hospital with complain of suicidal ideations with plans to shoot himself on the head. He does have hx of other chronic medical issues that includes, DM, HTN, GERD & s/p stent placement last August, 2022. After medical evaluation/clearance, he was brought to the [facility] & being recommended for mood stabilization treatments".  Prior to this discharge, [name] was seen and evaluated for mood stability. The current lab results and nurses' notes reviewed, both are stable. At this time, patient is both mentally and medically stable to be discharged to continue mental health care on an outpatient basis.   This is  one of multiple inpatient admissions to this [facility] as noted above.  After evaluation of his presenting symptoms, by his treatment team, [name] was recommended for mood stabilization treatment. He was treated and stabilized with medications as listed on his discharge medication list below. He was also enrolled in the group counseling sessions being held on this unit.  He participated and learned coping skills. All of his other medical issues were stable at the time of discharge.  He tolerated his treatment regimen without any side effects or adverse reactions.  [name]'s mood has stabilized as evidenced by his reports of improved mood, and absence of suicidal/homicidal ideations. He is now being discharged to continue mental health care on an outpatient basis. He currently denies any S/HI/AVH, delusional thoughts, or paranoia. Transportation to [facility] was coordinated by social work.   Physical Findings: AIMS: Facial and  Oral Movements Muscles of Facial Expression: None, normal Lips and Perioral Area: None, normal Jaw: None, normal Tongue: None, normal,Extremity Movements Upper (arms, wrists, hands, fingers): None, normal Lower (legs, knees, ankles, toes): None, normal, Trunk Movements Neck, shoulders, hips: None, normal, Overall Severity Severity of abnormal movements (highest score from questions above): None, normal Incapacitation due to abnormal movements: None, normal Patient's awareness of abnormal movements (rate only patient's report): No Awareness, Dental Status Current problems with teeth and/or dentures?: No Does patient usually wear dentures?: No  CIWA:  0 COWS:  N/A   Musculoskeletal: Strength & Muscle Tone: within normal limits Gait & Station: normal Patient leans: N/A  Psychiatric Specialty Exam:  Presentation  General Appearance: Appropriate for Environment; Fairly Groomed  Eye Contact:Good  Speech:Clear and Coherent  Speech Volume:Normal  Handedness:Right  Mood and Affect  Mood:Euthymic  Affect:Appropriate  Thought Process  Thought Processes:Coherent  Descriptions of Associations:Intact  Orientation:Full (Time, Place and Person)  Thought Content:Logical  History of Schizophrenia/Schizoaffective disorder:No  Duration of Psychotic Symptoms:N/A  Hallucinations:Hallucinations: None  Ideas of Reference:None  Suicidal Thoughts:Suicidal Thoughts: No  Homicidal Thoughts:Homicidal Thoughts: No  Sensorium  Memory:Immediate Good  Judgment:Good  Insight:Good  Executive Functions  Concentration:Good  Attention Span:Good  [place] of Knowledge:Good  Language:Good   Psychomotor Activity  Psychomotor Activity:Psychomotor Activity: Normal   Assets  Assets:Communication Skills; Financial Resources/Insurance; Desire for Improvement   Sleep  Sleep:Sleep: Fair  Physical Exam: Physical Exam Constitutional:      Appearance: Normal  appearance.  HENT:     Head: Normocephalic.     Nose: No congestion or rhinorrhea.     Mouth/Throat:     Mouth: Mucous membranes are moist.     Pharynx: No oropharyngeal exudate or posterior oropharyngeal erythema.  Eyes:     General: No scleral icterus.       Right eye: No discharge.        Left eye: No discharge.  Pulmonary:     Effort: No respiratory distress.     Breath sounds: No stridor. No wheezing or rhonchi.  Abdominal:     General: There is no distension.     Tenderness: There is no abdominal tenderness. There is no guarding.  Musculoskeletal:        General: No swelling or deformity.     Cervical back: No rigidity or tenderness.  Skin:    Coloration: Skin is not pale.  Neurological:     Mental Status: He is alert.     Cranial Nerves: No cranial nerve deficit.     Sensory: No sensory deficit.     Motor: No weakness.     Coordination: Coordination normal.     Gait: Gait normal.  Psychiatric:        Mood and Affect: Mood normal.        Behavior: Behavior normal.        Thought Content: Thought content normal.        Judgment: Judgment normal.   Review of Systems  Constitutional:  Negative for chills, fever and malaise/fatigue.  HENT: Negative.  Negative for congestion, ear pain, hearing loss, nosebleeds, sore throat and tinnitus.   Eyes: Negative.  Negative for blurred vision, double vision, pain, discharge and redness.  Respiratory: Negative.  Negative for cough, hemoptysis, sputum production, shortness of breath and wheezing.   Cardiovascular:  Negative for chest pain, palpitations, orthopnea and claudication.  Gastrointestinal: Negative.  Negative for abdominal pain, constipation, diarrhea, heartburn, nausea and vomiting.  Genitourinary: Negative.  Negative for dysuria, flank pain, frequency, hematuria and urgency.  Musculoskeletal:  Negative for back pain, falls, joint pain, myalgias and neck pain.  Skin:  Negative for itching and rash.  Neurological:   Negative for dizziness, tingling, tremors, sensory change, speech change, focal weakness, loss of consciousness, weakness and headaches.  Psychiatric/Behavioral:  Positive for depression (pt reported an improvement with current medications) and substance abuse (history of cocaine abuse, educated to not engage in cocaine or any other substance use while taking his medications). Negative for hallucinations, memory loss and suicidal ideas. The patient is not nervous/anxious and does not have insomnia.   Blood pressure (!) 147/76, pulse 71, temperature 97.7 F (36.5 C), temperature source Oral, resp. rate 16, height 5\' 9"  (1.753 m), weight 77.6 kg, SpO2 100 %. Body mass index is 25.25 kg/m.  Social History   Tobacco Use  Smoking Status Every Day   Packs/day: 0.50   Years: 20.00   Pack years: 10.00   Types: Cigarettes  Smokeless Tobacco Never   Tobacco Cessation:  A prescription for an FDA-approved tobacco cessation medication provided at discharge  Blood Alcohol level:  Lab Results  Component Value Date   [facility] <10 01/04/2021   ETH <10 46/27/0350   Metabolic Disorder Labs:  Lab Results  Component Value Date   HGBA1C 6.8 (H) 12/10/2020   MPG 148.46 12/10/2020   MPG 188.64 10/10/2020   No results found for: PROLACTIN Lab Results  Component Value Date   CHOL 177 12/10/2020   TRIG 124 12/10/2020   HDL 50 12/10/2020   CHOLHDL 3.5 12/10/2020   VLDL 25 12/10/2020   LDLCALC 102 (H) 12/10/2020   [place] 82 10/10/2020   See Psychiatric Specialty Exam and Suicide Risk Assessment completed by Attending Physician prior to discharge.  Discharge destination:  Other:  [place]   Is patient on multiple antipsychotic therapies at discharge:  No   Has Patient had three or more failed trials of antipsychotic monotherapy by history:  No  Recommended Plan for Multiple Antipsychotic Therapies: NA  Discharge Instructions     Diet - low sodium heart healthy   Complete by: As directed        Allergies as of 01/12/2021   No Active Allergies      Medication List     STOP taking these medications    diclofenac Sodium 1 % Gel Commonly known as: VOLTAREN   empagliflozin 25 MG Tabs tablet Commonly known as: JARDIANCE   lamoTRIgine 25 MG tablet Commonly known as: LAMICTAL   omeprazole 20 MG capsule Commonly known as: PRILOSEC   polyethylene glycol powder 17 GM/SCOOP powder Commonly known as: GLYCOLAX/MIRALAX       TAKE these medications      Indication  albuterol 108 (90 Base) MCG/ACT inhaler Commonly known as: VENTOLIN HFA Inhale 1-2 puffs into the lungs every 6 (six) hours as needed for wheezing or shortness of breath.  Indication: Asthma   ARIPiprazole 10 MG tablet Commonly known as: ABILIFY Take 1 tablet (10 mg total) by mouth daily. Bipolar disorder Start taking on: January 13, 2021 What changed:  how much to take additional instructions  Indication: MIXED BIPOLAR AFFECTIVE DISORDER   aspirin 81 MG EC tablet Take 1 tablet (81 mg total) by mouth daily. Blood vessel disease Start taking on: January 13, 2021 What changed: additional instructions  Indication: Disease involving Lipid Deposits in the Arteries   clopidogrel 75 MG tablet Commonly known as: PLAVIX Take 1 tablet (75 mg total) by mouth daily. Prevention of blood clots Start taking on: January 13, 2021 What changed: additional instructions  Indication: Disease involving a Thrombosis or an Embolism   DULoxetine 30 MG capsule Commonly known as: CYMBALTA Take 1 capsule (30 mg total) by mouth daily. Depression Start taking on: January 13, 2021 What changed:  medication strength how much to take additional instructions  Indication: Major Depressive Disorder   gabapentin 300 MG capsule Commonly known as: NEURONTIN Take 1 capsule (300 mg total) by mouth 3 (three) times daily. Neuropathy What changed:  medication strength how much to take additional instructions   Indication: Neuropathic Pain   hydrOXYzine 25 MG tablet Commonly known as: ATARAX/VISTARIL Take 1 tablet (25 mg total) by mouth 3 (three) times daily as needed for anxiety. anxiety  Indication: Feeling Anxious   metformin 1000 MG (OSM) 24 hr tablet Commonly known as:  FORTAMET Take 1 tablet (1,000 mg total) by mouth daily with supper. Diabetes What changed: additional instructions  Indication: Type 2 Diabetes   metFORMIN 500 MG 24 hr tablet Commonly known as: GLUCOPHAGE-XR Take 1 tablet (500 mg total) by mouth daily with breakfast. diabetes Start taking on: January 13, 2021 What changed: additional instructions  Indication: Type 2 Diabetes   metoprolol succinate 25 MG 24 hr tablet Commonly known as: TOPROL-XL Take 1 tablet (25 mg total) by mouth daily. Elevated blood pressure Start taking on: January 13, 2021 What changed: additional instructions  Indication: High Blood Pressure Disorder   multivitamin with minerals Tabs tablet Take 1 tablet by mouth daily as needed (for supplementation).  Indication: Nutritional Support   nicotine polacrilex 2 MG gum Commonly known as: NICORETTE Take 1 each (2 mg total) by mouth as needed for smoking cessation.  Indication: Nicotine Addiction, Nicotine replacement. Please buy over the counter   pantoprazole 40 MG tablet Commonly known as: PROTONIX Take 1 tablet (40 mg total) by mouth daily. Acid reflux disease What changed: additional instructions  Indication: Gastroesophageal Reflux Disease   rosuvastatin 40 MG tablet Commonly known as: CRESTOR Take 1 tablet (40 mg total) by mouth daily. Elevated cholesterol What changed: additional instructions  Indication: High Amount of Fats in the Blood   traZODone 50 MG tablet Commonly known as: DESYREL Take 50 mg by mouth at bedtime as needed for sleep.  Indication: Trouble Sleeping, for trouble sleeping        Follow-up Information     Clinic, [place]. Schedule an  appointment as soon as possible for a visit.   Why: Please return to this provider for therapy and medication management services. Contact information: [place] [address] [phone]                Follow-up recommendations: Prescriptions were sent to patient's preferred pharmacy at discharge. Patient was agreeable to his discharge plan.  Pt was given the opportunity to ask questions, and all were answered to his satisfaction.  Pt verbalized feeling comfortable with discharge denied any suicidal or homicidal thoughts prior to discharge. Patient was instructed prior to discharge to: Take all medications as prescribed by his mental healthcare provider. Report any adverse effects and or reactions from the medicines to his outpatient provider promptly. Patient was instructed & cautioned: To not engage in alcohol and or illegal drug use while on prescription medicines. He was educated that in the event of worsening symptoms, he is to call the mobile crisis hotline [phone], National Suicide prevention Lifeline 757-638-1210, 911 and/or go to the nearest ED for appropriate evaluation and treatment of symptoms. Pt was educated, and was agreeable to follow-up with his primary care provider for your other medical issues, concerns and or health care needs.    Signed: Nicholes Rough, NP 01/12/2021, 4:50 PM

## 2021-01-12 NOTE — Progress Notes (Signed)
D:  Patient's self inventory sheet, patient sleeps good, no sleep medication.  Good appetite, normal energy level, good concentration.  Denied depression, anxiety and hopeless.  Denied withdrawals.  Denied SI.  Then checked sometimes.  Physical problems, knees.  Pain medicine helpful.  Goal is think positive.  Be assertive.  Does have discharge plans. A:  Medications administered per MD orders.  Emotional support and encouragement given patient. R:  Denied SI and HI, contracts for safety.  Denied A/V hallucinations.  Safety maintained with 15 minute checks.

## 2021-01-12 NOTE — Progress Notes (Signed)
  [facility] Adult Case Management Discharge Plan :  Will you be returning to the same living situation after discharge:  No. [place] At discharge, do you have transportation home?: No. CSW will provide Safe Transport Do you have the ability to pay for your medications: Yes,  [facility] insurance  Release of information consent forms completed and sent to medical records;  Patient's signature obtained.  Patient to Follow up at:  Follow-up Information     Clinic, [place]. Schedule an appointment as soon as possible for a visit.   Why: Please return to this provider for therapy and medication management services. Contact information: [place] [address] [phone]                 Next level of care provider has access to [place] and Suicide Prevention discussed: Yes,  w/ pt     Has patient been referred to the Quitline?: Patient refused referral  Patient has been referred for addiction treatment: Pt. refused referral  Eliott Nine 01/12/2021, 9:51 AM

## 2021-01-12 NOTE — Progress Notes (Signed)
Adult Psychoeducational Group Note  Date:  01/12/2021 Time:  9:45 AM  Group Topic/Focus:  Goals Group:   The focus of this group is to help patients establish daily goals to achieve during treatment and discuss how the patient can incorporate goal setting into their daily lives to aide in recovery.  Participation Level:  Active  Participation Quality:  Appropriate  Affect:  Appropriate  Cognitive:  Appropriate  Insight: Appropriate  Engagement in Group:  Engaged  Modes of Intervention:  Discussion  Additional Comments:  Patient attended orientation/goal setting group. He said his goal for today is to be discharged today. He has already discussed his discharged plan.   Jennifer Payes W Whitt [place] 92/44/6286, 9:45 AM

## 2021-01-12 NOTE — Group Note (Signed)
Occupational Therapy Group Note  Group Topic:Feelings Management  Group Date: 01/12/2021 Start Time: 1400 End Time: 1440 Facilitators: Ponciano Ort, OT/L    Group Description:Group encouraged increased engagement and participation through discussion focused on Building Happiness. Patients were provided a handout and reviewed therapeutic strategies to build happiness including identifying gratitudes, random acts of kindness, exercise, meditation, positive journaling, and fostering relationships. Patients engaged in discussion and encouraged to reflect on each strategy and their experiences.  Therapeutic Goal(s): Identify strategies to build happiness. Identify and implement therapeutic strategies to improve overall mood. Practice and identify gratitudes, random acts of kindness, exercise, meditation, positive journaling, and fostering relationships     Participation Level: Did not attend   Plan: Continue to engage patient in OT groups 2 - 3x/week.  01/12/2021  Ponciano Ort, OT/L

## 2021-01-12 NOTE — BH IP Treatment Plan (Signed)
Interdisciplinary Treatment and Diagnostic Plan Update  01/12/2021  [name] MRN: [id number]  Principal Diagnosis: Bipolar affective disorder, depressed, severe ([place])  Secondary Diagnoses: Principal Problem:   Bipolar affective disorder, depressed, severe ([place]) Active Problems:   HLD (hyperlipidemia)   Essential hypertension   Diabetes type 2, controlled ([place])   Coronary artery disease   Cocaine abuse ([place])   Current Medications:  Current Facility-Administered Medications  Medication Dose Route Frequency Provider Last Rate Last Admin   acetaminophen (TYLENOL) tablet 650 mg  650 mg Oral Q6H PRN Lovena Le, Cody W, PA-C       albuterol (VENTOLIN HFA) 108 (90 Base) MCG/ACT inhaler 1-2 puff  1-2 puff Inhalation Q6H PRN Rankin, Shuvon B, NP       alum & mag hydroxide-simeth (MAALOX/MYLANTA) 200-200-20 MG/5ML suspension 30 mL  30 mL Oral Q4H PRN Lovena Le, Cody W, PA-C       ARIPiprazole (ABILIFY) tablet 10 mg  10 mg Oral Daily Lindell Spar I, NP   10 mg at 01/12/21 0743   aspirin EC tablet 81 mg  81 mg Oral Daily Rankin, Shuvon B, NP   81 mg at 01/12/21 0742   clopidogrel (PLAVIX) tablet 75 mg  75 mg Oral Daily Rankin, Shuvon B, NP   75 mg at 01/12/21 0743   DULoxetine (CYMBALTA) DR capsule 30 mg  30 mg Oral Daily Lindell Spar I, NP   30 mg at 01/12/21 0743   gabapentin (NEURONTIN) capsule 300 mg  300 mg Oral TID Lindell Spar I, NP   300 mg at 01/12/21 1134   hydrOXYzine (ATARAX/VISTARIL) tablet 25 mg  25 mg Oral TID PRN Prescilla Sours, PA-C   25 mg at 01/11/21 2106   magnesium hydroxide (MILK OF MAGNESIA) suspension 30 mL  30 mL Oral Daily PRN Lovena Le, Cody W, PA-C       metFORMIN (GLUCOPHAGE-XR) 24 hr tablet 1,000 mg  1,000 mg Oral Q supper Rankin, Shuvon B, NP   1,000 mg at 01/11/21 1656   metFORMIN (GLUCOPHAGE-XR) 24 hr tablet 500 mg  500 mg Oral Q breakfast Rankin, Shuvon B, NP   500 mg at 01/12/21 0744   metoprolol succinate (TOPROL-XL) 24 hr tablet 25 mg  25 mg Oral Daily Rankin,  Shuvon B, NP   25 mg at 01/12/21 0743   multivitamin with minerals tablet 1 tablet  1 tablet Oral Daily PRN Rankin, Shuvon B, NP       nicotine polacrilex (NICORETTE) gum 2 mg  2 mg Oral PRN Rankin, Shuvon B, NP   2 mg at 01/12/21 0747   pantoprazole (PROTONIX) EC tablet 40 mg  40 mg Oral Daily Rankin, Shuvon B, NP   40 mg at 01/12/21 0743   rosuvastatin (CRESTOR) tablet 40 mg  40 mg Oral Daily Rankin, Shuvon B, NP   40 mg at 01/12/21 0742   traZODone (DESYREL) tablet 50 mg  50 mg Oral QHS PRN Rankin, Shuvon B, NP   50 mg at 01/11/21 2106   PTA Medications: Medications Prior to Admission  Medication Sig Dispense Refill Last Dose   albuterol (VENTOLIN HFA) 108 (90 Base) MCG/ACT inhaler Inhale 1-2 puffs into the lungs every 6 (six) hours as needed for wheezing or shortness of breath. 1 each 0    ARIPiprazole (ABILIFY) 10 MG tablet Take 5 mg by mouth daily.      aspirin EC 81 MG tablet Take 81 mg by mouth daily. Swallow whole.      clopidogrel (PLAVIX) 75 MG  tablet Take 1 tablet (75 mg total) by mouth daily. 30 tablet 0    diclofenac Sodium (VOLTAREN) 1 % GEL Apply 2 g topically 4 (four) times daily as needed (to affected sites- for pain).      DULoxetine (CYMBALTA) 20 MG capsule Take 1 capsule (20 mg total) by mouth daily. 30 capsule 0    empagliflozin (JARDIANCE) 25 MG TABS tablet Take 12.5 mg by mouth daily.      gabapentin (NEURONTIN) 100 MG capsule Take 2 capsules (200 mg total) by mouth 3 (three) times daily. (Patient taking differently: Take 200 mg by mouth 3 (three) times daily as needed (AS DIRECTED).) 180 capsule 0    lamoTRIgine (LAMICTAL) 25 MG tablet Take 1 tablet (25 mg total) by mouth daily. 30 tablet 0    metFORMIN (FORTAMET) 1000 MG (OSM) 24 hr tablet Take 1 tablet (1,000 mg total) by mouth daily with supper. (Patient not taking: Reported on 01/05/2021) 30 tablet 0    metFORMIN (GLUCOPHAGE-XR) 500 MG 24 hr tablet Take 1 tablet (500 mg total) by mouth daily with breakfast. (Patient  taking differently: Take 500 mg by mouth 2 (two) times a week.) 30 tablet 0    metoprolol succinate (TOPROL-XL) 25 MG 24 hr tablet Take 1 tablet (25 mg total) by mouth daily. 30 tablet 0    Multiple Vitamin (MULTIVITAMIN WITH MINERALS) TABS tablet Take 1 tablet by mouth daily as needed (for supplementation).      nicotine polacrilex (NICORETTE) 2 MG gum Take 1 each (2 mg total) by mouth as needed for smoking cessation. 100 tablet 0    omeprazole (PRILOSEC) 20 MG capsule Take 20 mg by mouth daily before breakfast.      polyethylene glycol powder (GLYCOLAX/MIRALAX) 17 GM/SCOOP powder Take 17 g by mouth daily as needed for mild constipation (MIX AND DRINK AS DIRECTED).      traZODone (DESYREL) 50 MG tablet Take 50 mg by mouth at bedtime as needed for sleep.      [DISCONTINUED] pantoprazole (PROTONIX) 40 MG tablet Take 1 tablet (40 mg total) by mouth daily. (Patient not taking: Reported on 01/05/2021) 30 tablet 1    [DISCONTINUED] rosuvastatin (CRESTOR) 40 MG tablet Take 40 mg by mouth daily. (Patient not taking: Reported on 01/05/2021)       Patient Stressors:    Patient Strengths: Ability for insight  Active sense of humor  Physical Health   Treatment Modalities: Medication Management, Group therapy, Case management,  1 to 1 session with clinician, Psychoeducation, Recreational therapy.   Physician Treatment Plan for Primary Diagnosis: Bipolar affective disorder, depressed, severe ([place]) Long Term Goal(s): Improvement in symptoms so as ready for discharge   Short Term Goals: Ability to identify and develop effective coping behaviors will improve Ability to maintain clinical measurements within normal limits will improve Ability to identify triggers associated with substance abuse/mental health issues will improve Ability to identify changes in lifestyle to reduce recurrence of condition will improve Ability to verbalize feelings will improve Ability to disclose and discuss suicidal  ideas Ability to demonstrate self-control will improve  Medication Management: Evaluate patient's response, side effects, and tolerance of medication regimen.  Therapeutic Interventions: 1 to 1 sessions, Unit Group sessions and Medication administration.  Evaluation of Outcomes: Adequate for Discharge  Physician Treatment Plan for Secondary Diagnosis: Principal Problem:   Bipolar affective disorder, depressed, severe ([place]) Active Problems:   HLD (hyperlipidemia)   Essential hypertension   Diabetes type 2, controlled ([place])   Coronary artery disease  Cocaine abuse ([place])  Long Term Goal(s): Improvement in symptoms so as ready for discharge   Short Term Goals: Ability to identify and develop effective coping behaviors will improve Ability to maintain clinical measurements within normal limits will improve Ability to identify triggers associated with substance abuse/mental health issues will improve Ability to identify changes in lifestyle to reduce recurrence of condition will improve Ability to verbalize feelings will improve Ability to disclose and discuss suicidal ideas Ability to demonstrate self-control will improve     Medication Management: Evaluate patient's response, side effects, and tolerance of medication regimen.  Therapeutic Interventions: 1 to 1 sessions, Unit Group sessions and Medication administration.  Evaluation of Outcomes: Adequate for Discharge   RN Treatment Plan for Primary Diagnosis: Bipolar affective disorder, depressed, severe ([place]) Long Term Goal(s): Knowledge of disease and therapeutic regimen to maintain health will improve  Short Term Goals: Ability to participate in decision making will improve, Ability to verbalize feelings will improve, and Ability to identify and develop effective coping behaviors will improve  Medication Management: RN will administer medications as ordered by provider, will assess and evaluate patient's response and provide  education to patient for prescribed medication. RN will report any adverse and/or side effects to prescribing provider.  Therapeutic Interventions: 1 on 1 counseling sessions, Psychoeducation, Medication administration, Evaluate responses to treatment, Monitor vital signs and CBGs as ordered, Perform/monitor CIWA, COWS, AIMS and Fall Risk screenings as ordered, Perform wound care treatments as ordered.  Evaluation of Outcomes: Adequate for Discharge   LCSW Treatment Plan for Primary Diagnosis: Bipolar affective disorder, depressed, severe ([place]) Long Term Goal(s): Safe transition to appropriate next level of care at discharge, Engage patient in therapeutic group addressing interpersonal concerns.  Short Term Goals: Engage patient in aftercare planning with referrals and resources, Increase social support, and Increase ability to appropriately verbalize feelings  Therapeutic Interventions: Assess for all discharge needs, 1 to 1 time with Social worker, Explore available resources and support systems, Assess for adequacy in community support network, Educate family and significant other(s) on suicide prevention, Complete Psychosocial Assessment, Interpersonal group therapy.  Evaluation of Outcomes: Adequate for Discharge   Progress in Treatment: Attending groups: Yes. Participating in groups: Yes. Taking medication as prescribed: Yes. Toleration medication: Yes. Family/Significant other contact made: Yes, individual(s) contacted:  Pt declined consents Patient understands diagnosis: Yes. Discussing patient identified problems/goals with staff: Yes. Medical problems stabilized or resolved: Yes. Denies suicidal/homicidal ideation: Yes. Issues/concerns per patient self-inventory: No. Other: None  New problem(s) identified: No, Describe:  None  New Short Term/Long Term Goal(s): medication stabilization, elimination of SI thoughts, development of comprehensive mental wellness plan.   Patient  Goals:  "to think positive, be assertive and be cooperative."  Discharge Plan or Barriers: Pt will go to [place] and f/u with the [place] for services.   Reason for Continuation of Hospitalization: Medication stabilization  Estimated Length of Stay: 3-5 days   Scribe for Treatment Team: Eliott Nine 01/12/2021 11:42 AM

## 2021-01-25 ENCOUNTER — Emergency Department (HOSPITAL_COMMUNITY)
Admission: EM | Admit: 2021-01-25 | Discharge: 2021-01-28 | Disposition: A | Discharge status: Home / Self Care | Payer: No Typology Code available for payment source | Attending: Emergency Medicine | Admitting: Emergency Medicine

## 2021-01-25 ENCOUNTER — Other Ambulatory Visit: Payer: Self-pay

## 2021-01-25 ENCOUNTER — Encounter (HOSPITAL_COMMUNITY): Payer: Self-pay | Admitting: Emergency Medicine

## 2021-01-25 DIAGNOSIS — F333 Major depressive disorder, recurrent, severe with psychotic symptoms: Secondary | ICD-10-CM | POA: Diagnosis not present

## 2021-01-25 DIAGNOSIS — Z79899 Other long term (current) drug therapy: Secondary | ICD-10-CM | POA: Diagnosis not present

## 2021-01-25 DIAGNOSIS — R45851 Suicidal ideations: Secondary | ICD-10-CM | POA: Diagnosis not present

## 2021-01-25 DIAGNOSIS — Z7951 Long term (current) use of inhaled steroids: Secondary | ICD-10-CM | POA: Insufficient documentation

## 2021-01-25 DIAGNOSIS — R440 Auditory hallucinations: Secondary | ICD-10-CM | POA: Insufficient documentation

## 2021-01-25 DIAGNOSIS — I509 Heart failure, unspecified: Secondary | ICD-10-CM | POA: Diagnosis not present

## 2021-01-25 DIAGNOSIS — F1410 Cocaine abuse, uncomplicated: Secondary | ICD-10-CM | POA: Diagnosis not present

## 2021-01-25 DIAGNOSIS — Z20822 Contact with and (suspected) exposure to covid-19: Secondary | ICD-10-CM | POA: Insufficient documentation

## 2021-01-25 DIAGNOSIS — E119 Type 2 diabetes mellitus without complications: Secondary | ICD-10-CM | POA: Diagnosis not present

## 2021-01-25 DIAGNOSIS — Z7984 Long term (current) use of oral hypoglycemic drugs: Secondary | ICD-10-CM | POA: Diagnosis not present

## 2021-01-25 DIAGNOSIS — F17210 Nicotine dependence, cigarettes, uncomplicated: Secondary | ICD-10-CM | POA: Diagnosis not present

## 2021-01-25 DIAGNOSIS — Z85520 Personal history of malignant carcinoid tumor of kidney: Secondary | ICD-10-CM | POA: Insufficient documentation

## 2021-01-25 DIAGNOSIS — I2510 Atherosclerotic heart disease of native coronary artery without angina pectoris: Secondary | ICD-10-CM | POA: Insufficient documentation

## 2021-01-25 DIAGNOSIS — I119 Hypertensive heart disease without heart failure: Secondary | ICD-10-CM | POA: Insufficient documentation

## 2021-01-25 DIAGNOSIS — Z7902 Long term (current) use of antithrombotics/antiplatelets: Secondary | ICD-10-CM | POA: Diagnosis not present

## 2021-01-25 DIAGNOSIS — Z7982 Long term (current) use of aspirin: Secondary | ICD-10-CM | POA: Insufficient documentation

## 2021-01-25 LAB — CBC
HCT: 42.8 % (ref 39.0–52.0)
Hemoglobin: 13.7 g/dL (ref 13.0–17.0)
MCH: 28.4 pg (ref 26.0–34.0)
MCHC: 32 g/dL (ref 30.0–36.0)
MCV: 88.6 fL (ref 80.0–100.0)
Platelets: 173 10*3/uL (ref 150–400)
RBC: 4.83 MIL/uL (ref 4.22–5.81)
RDW: 14 % (ref 11.5–15.5)
WBC: 5.1 10*3/uL (ref 4.0–10.5)
nRBC: 0 % (ref 0.0–0.2)

## 2021-01-25 LAB — COMPREHENSIVE METABOLIC PANEL
ALT: 20 U/L (ref 0–44)
AST: 24 U/L (ref 15–41)
Albumin: 3.4 g/dL — ABNORMAL LOW (ref 3.5–5.0)
Alkaline Phosphatase: 58 U/L (ref 38–126)
Anion gap: 7 (ref 5–15)
BUN: 13 mg/dL (ref 8–23)
CO2: 27 mmol/L (ref 22–32)
Calcium: 8.8 mg/dL — ABNORMAL LOW (ref 8.9–10.3)
Chloride: 104 mmol/L (ref 98–111)
Creatinine, Ser: 0.91 mg/dL (ref 0.61–1.24)
GFR, Estimated: >60 mL/min (ref >60)
Glucose, Bld: 152 mg/dL — ABNORMAL HIGH (ref 70–99)
Potassium: 4.4 mmol/L (ref 3.5–5.1)
Sodium: 138 mmol/L (ref 135–145)
Total Bilirubin: 0.6 mg/dL (ref 0.3–1.2)
Total Protein: 6.2 g/dL — ABNORMAL LOW (ref 6.5–8.1)

## 2021-01-25 LAB — ETHANOL: Alcohol, Ethyl (B): <10 mg/dL (ref <10)

## 2021-01-25 LAB — ACETAMINOPHEN LEVEL: Acetaminophen (Tylenol), Serum: <10 ug/mL — ABNORMAL LOW (ref 10–30)

## 2021-01-25 LAB — RESP PANEL BY RT-PCR (FLU A&B, COVID) ARPGX2
Influenza A by PCR: NEGATIVE
Influenza B by PCR: NEGATIVE
SARS Coronavirus 2 by RT PCR: NEGATIVE

## 2021-01-25 LAB — SALICYLATE LEVEL: Salicylate Lvl: <7 mg/dL — ABNORMAL LOW (ref 7.0–30.0)

## 2021-01-25 LAB — CBG MONITORING, ED: Glucose-Capillary: 169 mg/dL — ABNORMAL HIGH (ref 70–99)

## 2021-01-25 MED ORDER — ALBUTEROL SULFATE (2.5 MG/3ML) 0.083% IN NEBU: 2.5000 mg | INHALATION_SOLUTION | Freq: Four times a day (QID) | RESPIRATORY_TRACT | Status: DC | PRN

## 2021-01-25 MED ORDER — METFORMIN HCL ER 500 MG PO TB24
1000.0000 mg | ORAL_TABLET | Freq: Every day | ORAL | Status: DC
  Administered 2021-01-26 – 2021-01-27 (×2): 1000 mg via ORAL
  Filled 2021-01-25 (×4): qty 2

## 2021-01-25 MED ORDER — ASPIRIN EC 81 MG PO TBEC
81.0000 mg | DELAYED_RELEASE_TABLET | Freq: Every day | ORAL | Status: DC
  Administered 2021-01-25 – 2021-01-28 (×4): 81 mg via ORAL
  Filled 2021-01-25 (×4): qty 1

## 2021-01-25 MED ORDER — ARIPIPRAZOLE 10 MG PO TABS
10.0000 mg | ORAL_TABLET | Freq: Every day | ORAL | Status: DC
  Administered 2021-01-26 – 2021-01-28 (×3): 10 mg via ORAL
  Filled 2021-01-25 (×3): qty 1

## 2021-01-25 MED ORDER — CLOPIDOGREL BISULFATE 75 MG PO TABS
75.0000 mg | ORAL_TABLET | Freq: Every day | ORAL | Status: DC
  Administered 2021-01-25 – 2021-01-28 (×4): 75 mg via ORAL
  Filled 2021-01-25 (×4): qty 1

## 2021-01-25 MED ORDER — METOPROLOL SUCCINATE ER 25 MG PO TB24
25.0000 mg | ORAL_TABLET | Freq: Every day | ORAL | Status: DC
  Administered 2021-01-25 – 2021-01-28 (×4): 25 mg via ORAL
  Filled 2021-01-25 (×4): qty 1

## 2021-01-25 MED ORDER — HYDROXYZINE HCL 25 MG PO TABS: 25.0000 mg | ORAL_TABLET | Freq: Three times a day (TID) | ORAL | Status: DC | PRN

## 2021-01-25 MED ORDER — DULOXETINE HCL 30 MG PO CPEP
30.0000 mg | ORAL_CAPSULE | Freq: Every day | ORAL | Status: DC
  Administered 2021-01-26 – 2021-01-28 (×3): 30 mg via ORAL
  Filled 2021-01-25 (×4): qty 1

## 2021-01-25 MED ORDER — ALBUTEROL SULFATE HFA 108 (90 BASE) MCG/ACT IN AERS: 1.0000 | INHALATION_SPRAY | Freq: Four times a day (QID) | RESPIRATORY_TRACT | Status: DC | PRN

## 2021-01-25 MED ORDER — METFORMIN HCL ER 500 MG PO TB24: 500.0000 mg | ORAL_TABLET | Freq: Every day | ORAL | Status: DC

## 2021-01-25 MED ORDER — GABAPENTIN 300 MG PO CAPS
300.0000 mg | ORAL_CAPSULE | Freq: Three times a day (TID) | ORAL | Status: DC
  Administered 2021-01-25 – 2021-01-28 (×8): 300 mg via ORAL
  Filled 2021-01-25 (×8): qty 1

## 2021-01-25 NOTE — Discharge Instructions (Addendum)
[facility] Monday - Friday 8am - 3pm          Sat & Sun 8am - 2pm [address] [place], [place] [address]   [phone]     [facility] offers among other critical resources: showers, laundry, barbershop, phone bank, mailroom, computer lab, medical clinic, gardens and a bike maintenance area.   AREA SHELTERS  [facility]  (Men & women) [address] [place] [phone]  [facility] (Men/women/families) [address] [place] [phone]   [facility] (Families with children) [address]  [place] [phone]   [facility] (Domestic Violence Shelter) [address]  [place] [phone]   [facility] (Children ages 7-17) [address]  [place] [phone]   [facility]   (Women & children) [address] [place] [phone]   [facility] (Women/substance abuse) [address]  [place] [phone]   [facility] (Men) [address]   [phone]   [facility] (Men) [address]  [place] [phone]  [facility] (Women) [address]  [place] [phone]   [facility] (Single women & women with children) [address]  [place] [phone]  [facility] (Men/women/families) [address]  [place] [phone]    [facility]   (Domestic Violence shelter) [address]  [place] [phone]   [facility] (Men & women) [address]  [place] [phone]  [facility] (Men) [address]  [place] [phone]   [facility] (Men) [address]  [place] [phone]   [facility] (Single women & families) [address]  [place] [phone]  [facility] (Men/women & families) [address]   [place] [phone]    If you are at risk of losing your housing (throughout [place]) call the Housing Hotline at (336)691-9521. You may also contact 2-1-1, a FREE service of the [employer] that provides information about many resources including housing. Dial 211, or visit online at www.nc211.org. [place]:  [facility], contact Steve Adams [phone] (men only)                          [facility] in [place]:  90 day homeless program for women and men;                             contact Rev. Chambers [phone]  [place]:  [facility]:  men/women/children [phone]  [place]:  Shelter in [place], Pastor Kivett [phone]                       [facility] in [place], Santiago Lopez [phone]   [place]:  [facility], 910-572-3749; (women and children)  [place]:  [facility], men/women/children; [phone]                           [facility] in [place], [phone]; substance abuse halfway house for men              [facility]; 4 bedroom house in [place] for homeless women, contact Elaine Owens [phone]               [facility]   in [place], [phone] (women and children)               [facility], for abused women and children, 24 hour crisis line, [phone], [place], halfway house for women, [place], [place]:  [facility], [phone]; open Mon-Thurs from LTE43 - March 15 when temp is below 32 degrees                                [facility]; [place], [phone]; cell [phone]; open 24/7  [facility]:  [facility] [place]:  transitional housing for women and children; Jeanie Cooks [phone]  [facility]   [address], [place]    Hours: Monday-Friday 9a to [employer] Table  [employer]. [place], [place]   Hours: Tuesday-Friday 10 am to 1pm   [facility] at [place], [place]   Hours: Tuesday 2-330 pm

## 2021-01-25 NOTE — ED Notes (Signed)
Pt refusing blood glucose check
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
## 2021-01-25 NOTE — ED Provider Notes (Addendum)
Emergency Medicine Provider Triage Evaluation Note  [name] , a 65 y.o. male  was evaluated in triage.  Pt complains of suicidal ideations.  He states that for 3 days he has had ongoing thoughts about shooting himself.  He denies personally owning a weapon however he states that he has friends that have weapons.  He states that he has asked them for weapons and they have denied him.  He states that he has a history of PTSD and bipolar however he has not been taking his medications.  He does not see a therapist.  He states that he is currently homeless and living at a friend's house.  Endorses food and water insecurity as well as difficulty obtaining medications.  Denies HI, AVH.  Review of Systems  Positive: See above Negative:   Physical Exam  BP 126/81   Pulse 85   Temp 97.6 F (36.4 C)   Resp 16   SpO2 100%  Gen:   Awake, no distress   Resp:  Normal effort  MSK:   Moves extremities without difficulty  Other:    Medical Decision Making  Medically screening exam initiated at 9:35 AM.  Appropriate orders placed.  [name] was informed that the remainder of the evaluation will be completed by another provider, this initial triage assessment does not replace that evaluation, and the importance of remaining in the ED until their evaluation is complete.  Placed TOC order for med assistance, homelessness, food insecurity.   [name], PA-C 01/25/21 0936    [name], PA-C 01/25/21 1111    [name], MD 01/26/21 0021

## 2021-01-25 NOTE — TOC Initial Note (Signed)
Transition of Care [facility]) - Initial/Assessment Note    Patient Details  Name: [name] MRN: [id number] Date of Birth: [birth year]  Transition of Care [facility]) CM/SW Contact:    Verdell Carmine, RN Phone Number: 01/25/2021, 9:49 AM  Clinical Narrative:                  Patient in for SI. Has been here before last month for the same. Will obtain TTS assessment. Patient has [employer], he is ineligible for [facility]. He needs to call the [place] to set up an appointment with PCP, he can get his medications shipped to friends residence, ( where he is currently staying).    Barriers to Discharge:  (mental health assessment)   Patient Goals and CMS Choice        Expected Discharge Plan and [place], Homeless resources placed on patient isntructions                                        Prior Living Arrangements/Services    Living with friend                   Activities of Daily Living      Permission Sought/Granted                  Emotional Assessment              Admission diagnosis:  z04.6 Patient Active Problem List   Diagnosis Date Noted   Cocaine use disorder, severe, dependence ([place]) 01/06/2021   Cannabis use disorder, severe, in controlled environment ([place]) 01/06/2021   Substance induced mood disorder ([place]) 01/06/2021   Allergic rhinitis 12/16/2020   Tinea pedis 12/16/2020   Tear film insufficiency 12/16/2020   Person feigning illness 12/16/2020   Arthropathy 12/16/2020   Multiple nodules of lung 12/16/2020   Dystrophia unguium 12/16/2020   Legal circumstance 12/16/2020   Insomnia 12/16/2020   Homelessness 12/16/2020   Esophageal reflux 12/16/2020   Drug-induced mood disorder ([place]) 12/16/2020   Elevated prostate specific antigen (PSA) 12/16/2020   Dizziness and giddiness 12/16/2020   Disorder of kidney and ureter 12/16/2020   Diarrhea 12/16/2020   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,  abuse ([place]) 12/16/2020   Alcohol abuse 12/09/2020   Homicidal ideation 12/06/2020   Cocaine abuse ([place]) 12/06/2020   S/P coronary artery stent placement (DES to LCx) 10/11/2020   Pleural effusion due to congestive heart failure ([place]) 01/11/2020   Aortic atherosclerosis ([place]) 01/11/2020   Coronary artery disease 01/11/2020   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   Orthostatic hypotension 01/10/2020   Heart failure, unspecified ([place]) 01/06/2020   Tobacco use disorder 01/06/2020   Obesity 01/06/2020   Bilateral lower extremity edema 01/04/2020   Shortness of breath 01/04/2020   Severe episode of recurrent major depressive disorder, without psychotic features ([place]) 10/09/2016   Diabetes type 2, controlled ([place]) 01/01/2015   Erectile dysfunction 01/01/2015   Healthcare maintenance 01/01/2015   Bipolar 1 disorder, depressed ([place]) 07/23/2013   Bipolar disorder ([place]) 04/03/2013   MDD (major depressive disorder), recurrent, severe, with psychosis ([employer]) 11/04/2012   Suicidal ideation 11/04/2012   Post traumatic stress disorder (PTSD) 11/04/2012   CARCINOMA, RENAL CELL 06/05/2008   HLD (hyperlipidemia) 06/05/2008   Bipolar affective disorder, depressed, severe ([place]) 06/05/2008   Essential hypertension 06/05/2008   CHEST  PAIN 06/05/2008   PCP:  Clinic, [place]:   [facility], [address] [place] [address] Phone: [phone] Fax: [place], [place] - [address] [place] [address] Phone: [phone] Fax: [phone]  [place], [place] [address] [place] [address] Phone: [phone] Fax: [phone]     Social Determinants of Health (SDOH) Interventions    Readmission Risk Interventions No flowsheet data found.

## 2021-01-25 NOTE — BHH Counselor (Signed)
10:06am - TTS attempted to see patient Santiago Glad, RN reported patient is still at Triage changing and going to h20 - however, once moved she will no longer be his nurse.   TTS will attempt to see patient later this date

## 2021-01-25 NOTE — ED Triage Notes (Signed)
Pt states he is suicidal  x 3 days with a plan to "shoot myself in the head."

## 2021-01-25 NOTE — BH Assessment (Signed)
Clinician message Valora Piccolo, RN: "Hey. It's Trey with TTS. Is the pt able to be seen also is the pt under IVC?"   Clinician awaiting response.    Vertell [name], [place], [facility], [facility] Triage Specialist [phone]

## 2021-01-25 NOTE — ED Notes (Signed)
Pt in yhellow on my arrival  waiting for tts.  N o tts was ever coompleted  moved back to the hallway where the pt started  tts machine moves back to purple.  Pt sleepoing since 1530 calm  siutter aT  THE BEDSIDE

## 2021-01-25 NOTE — ED Provider Notes (Signed)
[facility] EMERGENCY DEPARTMENT Provider Note   CSN: [id number] Arrival date & time: 01/25/21  0370     History Chief Complaint  Patient presents with   Suicidal    [name] is a 65 y.o. male with a past medical history of anxiety, depression and suicidal ideations presenting today with complaint of suicidal ideations.  Reports a plan to shoot himself with any gun he can find.  Says this has been going on off and on for many months.  Previously was on medications for this however reports that he only occasionally takes them.  Drinks about a 12 pack of beer a week.  Regularly smokes crack cocaine.  Homeless however has been staying with a friend lately.  Reports auditory hallucinations and the voices tell him to end his life.  Not homicidal.   Past Medical History:  Diagnosis Date   Anxiety    Arthritis    Cancer ([place])    Cataract    Depression    Diabetes mellitus    GERD (gastroesophageal reflux disease)    H/O suicide attempt 1997 and 2011   jumped out of moving vehicle and attempted to jump into moving traffic   High cholesterol    Hypertension    Neuropathic pain    Osteoporosis     Patient Active Problem List   Diagnosis Date Noted   Cocaine use disorder, severe, dependence ([place]) 01/06/2021   Cannabis use disorder, severe, in controlled environment ([place]) 01/06/2021   Substance induced mood disorder ([place]) 01/06/2021   Allergic rhinitis 12/16/2020   Tinea pedis 12/16/2020   Tear film insufficiency 12/16/2020   Person feigning illness 12/16/2020   Arthropathy 12/16/2020   Multiple nodules of lung 12/16/2020   Dystrophia unguium 12/16/2020   Legal circumstance 12/16/2020   Insomnia 12/16/2020   Homelessness 12/16/2020   Esophageal reflux 12/16/2020   Drug-induced mood disorder ([place]) 12/16/2020   Elevated prostate specific antigen (PSA) 12/16/2020   Dizziness and giddiness 12/16/2020   Disorder of kidney and ureter 12/16/2020    Diarrhea 12/16/2020   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, abuse ([place]) 12/16/2020   Alcohol abuse 12/09/2020   Homicidal ideation 12/06/2020   Cocaine abuse ([place]) 12/06/2020   S/P coronary artery stent placement (DES to LCx) 10/11/2020   Pleural effusion due to congestive heart failure ([place]) 01/11/2020   Aortic atherosclerosis ([place]) 01/11/2020   Coronary artery disease 01/11/2020   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   Orthostatic hypotension 01/10/2020   Heart failure, unspecified ([place]) 01/06/2020   Tobacco use disorder 01/06/2020   Obesity 01/06/2020   Bilateral lower extremity edema 01/04/2020   Shortness of breath 01/04/2020   Severe episode of recurrent major depressive disorder, without psychotic features ([place]) 10/09/2016   Diabetes type 2, controlled ([place]) 01/01/2015   Erectile dysfunction 01/01/2015   Healthcare maintenance 01/01/2015   Bipolar 1 disorder, depressed ([place]) 07/23/2013   Bipolar disorder ([place]) 04/03/2013   MDD (major depressive disorder), recurrent, severe, with psychosis ([place]) 11/04/2012   Suicidal ideation 11/04/2012   Post traumatic stress disorder (PTSD) 11/04/2012   CARCINOMA, RENAL CELL 06/05/2008   HLD (hyperlipidemia) 06/05/2008   Bipolar affective disorder, depressed, severe ([place]) 06/05/2008   Essential hypertension 06/05/2008   CHEST PAIN 06/05/2008    Past Surgical History:  Procedure Laterality Date   COLONOSCOPY     CORONARY STENT INTERVENTION N/A 10/10/2020   Procedure: CORONARY STENT INTERVENTION;  Surgeon: Jettie Booze, MD;  Location: [facility];  Service:  Cardiovascular;  Laterality: N/A;   LEFT HEART CATH AND CORONARY ANGIOGRAPHY N/A 10/10/2020   Procedure: LEFT HEART CATH AND CORONARY ANGIOGRAPHY;  Surgeon: Jettie Booze, MD;  Location: [facility];  Service: Cardiovascular;  Laterality: N/A;   NEPHRECTOMY     due to cancer   RIGHT/LEFT HEART CATH AND CORONARY ANGIOGRAPHY N/A 10/09/2020    Procedure: RIGHT/LEFT HEART CATH AND CORONARY ANGIOGRAPHY;  Surgeon: Nelva Bush, MD;  Location: [facility];  Service: Cardiovascular;  Laterality: N/A;       Family History  Problem Relation Age of Onset   Alcohol abuse Sister    Alcohol abuse Brother    Colon cancer Neg Hx    Esophageal cancer Neg Hx    Pancreatic cancer Neg Hx    Rectal cancer Neg Hx    Stomach cancer Neg Hx     Social History   Tobacco Use   Smoking status: Every Day    Packs/day: 0.50    Years: 20.00    Pack years: 10.00    Types: Cigarettes   Smokeless tobacco: Never  Vaping Use   Vaping Use: Never used  Substance Use Topics   Alcohol use: Yes    Alcohol/week: 3.0 standard drinks    Types: 3 Cans of beer per week    Comment: Pt has been clean for 8 months   Drug use: Yes    Types: "Crack" cocaine, Cocaine    Home Medications Prior to Admission medications   Medication Sig Start Date End Date Taking? Authorizing Provider  albuterol (VENTOLIN HFA) 108 (90 Base) MCG/ACT inhaler Inhale 1-2 puffs into the lungs every 6 (six) hours as needed for wheezing or shortness of breath. 12/16/20 01/15/21  Maida Sale, MD  ARIPiprazole (ABILIFY) 10 MG tablet Take 1 tablet (10 mg total) by mouth daily. Bipolar disorder 01/13/21   Nicholes Rough, NP  aspirin EC 81 MG EC tablet Take 1 tablet (81 mg total) by mouth daily. Blood vessel disease 01/13/21   Nicholes Rough, NP  clopidogrel (PLAVIX) 75 MG tablet Take 1 tablet (75 mg total) by mouth daily. Prevention of blood clots 01/13/21   Nicholes Rough, NP  DULoxetine (CYMBALTA) 30 MG capsule Take 1 capsule (30 mg total) by mouth daily. Depression 01/13/21   Nicholes Rough, NP  gabapentin (NEURONTIN) 300 MG capsule Take 1 capsule (300 mg total) by mouth 3 (three) times daily. Neuropathy 01/12/21   Nicholes Rough, NP  hydrOXYzine (ATARAX/VISTARIL) 25 MG tablet Take 1 tablet (25 mg total) by mouth 3 (three) times daily as needed for anxiety. anxiety  01/12/21   Nicholes Rough, NP  metFORMIN (FORTAMET) 1000 MG (OSM) 24 hr tablet Take 1 tablet (1,000 mg total) by mouth daily with supper. Diabetes 01/12/21   Nicholes Rough, NP  metFORMIN (GLUCOPHAGE-XR) 500 MG 24 hr tablet Take 1 tablet (500 mg total) by mouth daily with breakfast. diabetes 01/13/21   Nicholes Rough, NP  metoprolol succinate (TOPROL-XL) 25 MG 24 hr tablet Take 1 tablet (25 mg total) by mouth daily. Elevated blood pressure 01/13/21   Nicholes Rough, NP  Multiple Vitamin (MULTIVITAMIN WITH MINERALS) TABS tablet Take 1 tablet by mouth daily as needed (for supplementation).    [provider]  nicotine polacrilex (NICORETTE) 2 MG gum Take 1 each (2 mg total) by mouth as needed for smoking cessation. 12/16/20   Maida Sale, MD  pantoprazole (PROTONIX) 40 MG tablet Take 1 tablet (40 mg total) by mouth daily. Acid reflux  disease 01/12/21 03/13/21  Nicholes Rough, NP  rosuvastatin (CRESTOR) 40 MG tablet Take 1 tablet (40 mg total) by mouth daily. Elevated cholesterol 01/12/21   Nicholes Rough, NP  traZODone (DESYREL) 50 MG tablet Take 50 mg by mouth at bedtime as needed for sleep.    [provider]    Allergies    Patient has no active allergies.  Review of Systems   Review of Systems  Respiratory:  Negative for shortness of breath.   Cardiovascular:  Negative for chest pain.  Psychiatric/Behavioral:  Positive for hallucinations and suicidal ideas. The patient is nervous/anxious.   All other systems reviewed and are negative.  Physical Exam Updated Vital Signs BP 126/81   Pulse 85   Temp 97.6 F (36.4 C)   Resp 16   SpO2 100%   Physical Exam Vitals and nursing note reviewed.  Constitutional:      Appearance: Normal appearance.  HENT:     Head: Normocephalic and atraumatic.  Eyes:     General: No scleral icterus.    Conjunctiva/sclera: Conjunctivae normal.  Pulmonary:     Effort: Pulmonary effort is normal. No respiratory distress.  Skin:     Findings: No rash.  Neurological:     Mental Status: He is alert.  Psychiatric:        Mood and Affect: Mood normal.    ED Results / Procedures / Treatments   Labs (all labs ordered are listed, but only abnormal results are displayed) Labs Reviewed  COMPREHENSIVE METABOLIC PANEL - Abnormal; Notable for the following components:      Result Value   Glucose, Bld 152 (*)    Calcium 8.8 (*)    Total Protein 6.2 (*)    Albumin 3.4 (*)    All other components within normal limits  SALICYLATE LEVEL - Abnormal; Notable for the following components:   Salicylate Lvl <7.4 (*)    All other components within normal limits  ACETAMINOPHEN LEVEL - Abnormal; Notable for the following components:   Acetaminophen (Tylenol), Serum <10 (*)    All other components within normal limits  RESP PANEL BY RT-PCR (FLU A&B, COVID) ARPGX2  ETHANOL  CBC  RAPID URINE DRUG SCREEN, HOSP PERFORMED    EKG None  Radiology No results found.  Procedures Procedures   Medications Ordered in ED Medications - No data to display  ED Course  I have reviewed the triage vital signs and the nursing notes.  Pertinent labs & imaging results that were available during my care of the patient were reviewed by me and considered in my medical decision making (see chart for details).    MDM Rules/Calculators/A&P Patient fully evaluated by me.  He was resting comfortably on the stretcher and eating breakfast.  Cooperative and participatory in history taking.  Reported that his friend suggested he get help for his suicidal ideations.  Medically clear at this time and understands that he will speak with our social worker and psych team.  Disposition as recommended by them.  Final Clinical Impression(s) / ED Diagnoses Final diagnoses:  Suicidal ideation    Rx / DC Orders Medically clear, dispo by [facility]   Rhae Hammock, PA-C 01/25/21 1452    Blanchie Dessert, MD 01/25/21 1457

## 2021-01-26 LAB — RAPID URINE DRUG SCREEN, HOSP PERFORMED
Amphetamines: NOT DETECTED
Barbiturates: NOT DETECTED
Benzodiazepines: NOT DETECTED
Cocaine: POSITIVE — AB
Opiates: NOT DETECTED
Tetrahydrocannabinol: POSITIVE — AB

## 2021-01-26 NOTE — Progress Notes (Signed)
CSW spoke with [name] via phone and provided her with the patient's demographic information. Per April the Transfer Coordinator, the Pt has been added to their waitlist. April encouraged the CSW to follow up in the morning regarding the status and eligibility for acceptance.    [name], MSW, LCSW-A  2:29 PM 01/26/2021

## 2021-01-26 NOTE — ED Notes (Signed)
Alert, NAD, calm, interactive, given snack and drink and PO scheduled meds.

## 2021-01-26 NOTE — Progress Notes (Signed)
CSW contacted the [employer] [facility] and left a message with April the transfer coordinator at [phone] ext. [phone]. CSW is waiting to hear back.    [name], MSW, LCSW-A  2:19 PM 01/26/2021

## 2021-01-26 NOTE — Progress Notes (Signed)
CSW contacted [facility] to speak with the Transfer Coordinator but there was no answer. CSW left a voice message asking the them to return the call. CSW is waiting to hear back.    [name], MSW, LCSW-A  11:42 AM 01/26/2021

## 2021-01-26 NOTE — ED Notes (Signed)
Pt belongings found at nurses station

## 2021-01-26 NOTE — ED Notes (Signed)
Breakfast order placed ?

## 2021-01-26 NOTE — ED Notes (Signed)
Pt to purple with sitter to shower

## 2021-01-26 NOTE — ED Notes (Signed)
Pt asked for different meal to be ordered. Notified that we only order the house tray for patients in the emergency room unless needed a special diet. Pt given [place] sandwich and sprite to drink.

## 2021-01-26 NOTE — Progress Notes (Signed)
BHH/BMU LCSW Progress Note   01/26/2021    7:09 PM  Dorion Petillo   [phone]   Type of Contact and Topic:  Referral for inpatient psychiatric care   CSW contacted all [facility] facilities in [place] to determine bed availability. Currently no availability at [facility], [facility], or [place].  [facility] reports bed availability, CSW sent referral via hub. Morning CSW to follow up with [place] in the morning at [phone] ext. [phone]  Patient remains on waitlist at [facility].     Signed:  Durenda Hurt, MSW, LCSWA, LCAS 01/26/2021 7:09 PM

## 2021-01-26 NOTE — ED Notes (Signed)
Alert, NAD, calm, interactive, resting on stretcher. No sitter. SI. Voluntary. Waiting on placement at the [place]. Belongings in Purple (per previous note).

## 2021-01-26 NOTE — ED Notes (Signed)
Pt belongings placed on counter in purple locker room due to no lockers being open.

## 2021-01-27 NOTE — Progress Notes (Signed)
CSW spoke with April from [facility] who confirmed that they currently have bed availability and has agreed to review this Pt for potential acceptance. CSW is coordinating with ED staff to gather all needed information to complete the [facility] referral. CSW will continue to track and document progress.    [name], MSW, LCSW-A  1:46 PM 01/27/2021

## 2021-01-27 NOTE — Progress Notes (Signed)
CSW inquired about the process of IVCing by contacting ED providers and psychic NP. [facility] requires IVC paperwork to be attached to [facility] referral to be eligible for review. CSW will continue to track progress of IVC to attach it to the [facility] referral for review at [facility], [place] and [place].    [name], MSW, LCSW-A  1:33 PM 01/27/2021

## 2021-01-27 NOTE — ED Notes (Signed)
IVC paperwork and handed to nurse

## 2021-01-27 NOTE — ED Notes (Signed)
Pt offered night dose of gabapentin, states he wants to "wait a little bit", pt instructed to let this RN know when he wanted to take it before bed

## 2021-01-27 NOTE — Progress Notes (Signed)
At 7:40pm CSW notified via secure chat from [facility] that IVC paperwork was faxed. At 8:22pm CSW received IVC paperwork via fax. CSW then completed the [place] Transfer Information Sheet and reviewed the [place] Form [id number] to ensure all documentation was properly completed.  CSW faxed [place] referral to both day and after-hours fax [phone] and [phone]. First shift CSW will assist and follow-up with referral.     Benjaman Kindler, MSW, LCSWA 01/27/2021 10:59 PM

## 2021-01-27 NOTE — Progress Notes (Signed)
CSW spoke with [facility] who confirmed the following [facility] bed availability. Per April, [facility] is [facility] mental [facility] capacity, [facility] [place] has two available beds, [facility] is on mental health diversion, [facility] has three beds, [facility] [place] is on mental health diversion, [facility] has one bed available, and [facility] has no bed available. CSW will submit mental health referrals to facilities with bed availability and follow-up with [facility] regarding referral status.    [name], MSW, LCSW-A  11:49 AM 01/27/2021

## 2021-01-28 ENCOUNTER — Encounter (HOSPITAL_COMMUNITY): Payer: Self-pay

## 2021-01-28 NOTE — Progress Notes (Signed)
Pt accepted to [facility] (Patient to report to the 1st floor ED for a repeat COVID test)     Patient meets inpatient criteria per Quintella Reichert, NP  The attending provider will be Demetrios Isaacs, NP  Call report to [phone] Ext [phone] or [phone]  Skeet Simmer, RN @ [facility] ED notified.     Pt scheduled  to arrive at [place] 1600.    [name], MSW, LCSW-A  11:38 AM 01/28/2021

## 2021-01-28 NOTE — ED Provider Notes (Signed)
Emergency Medicine Observation Re-evaluation Note  [name] is a 65 y.o. male, seen on rounds today at 0800.  Pt initially presented to the ED for complaints of Suicidal Currently, the patient is resting comfortably.  Physical Exam  BP 130/86 (BP Location: Right Arm)    Pulse 82    Temp 98.4 F (36.9 C) (Oral)    Resp 18    SpO2 100%  Physical Exam General: NAD   ED Course / MDM  EKG:   I have reviewed the labs performed to date as well as medications administered while in observation.  Recent changes in the last 24 hours include no acute events reported.  Plan  Current plan is for psych placement. [name] is under involuntary commitment.      Valarie Merino, MD 01/28/21 [phone]

## 2021-01-29 ENCOUNTER — Telehealth: Payer: Self-pay

## 2021-01-29 NOTE — Telephone Encounter (Addendum)
Transition Care Management Unsuccessful Follow-up Telephone Call  Date of discharge and from where:  01/28/2021 from [facility]  Attempts:  1st Attempt  Reason for unsuccessful TCM follow-up call:  Missing or invalid number

## 2021-01-30 NOTE — Telephone Encounter (Signed)
Transition Care Management Unsuccessful Follow-up Telephone Call  Date of discharge and from where:  01/28/2021 from [facility]  Attempts:  2nd Attempt  Reason for unsuccessful TCM follow-up call:  Missing or invalid number

## 2021-02-02 NOTE — Telephone Encounter (Signed)
Transition Care Management Unsuccessful Follow-up Telephone Call  Date of discharge and from where:  01/28/2021 from [facility]  Attempts:  3rd Attempt  Reason for unsuccessful TCM follow-up call:  Unable to reach patient

## 2021-03-02 DIAGNOSIS — R45851 Suicidal ideations: Secondary | ICD-10-CM | POA: Diagnosis not present

## 2021-03-02 DIAGNOSIS — Z20822 Contact with and (suspected) exposure to covid-19: Secondary | ICD-10-CM | POA: Diagnosis not present

## 2021-03-02 DIAGNOSIS — F32A Depression, unspecified: Secondary | ICD-10-CM | POA: Diagnosis not present

## 2021-03-02 DIAGNOSIS — F1994 Other psychoactive substance use, unspecified with psychoactive substance-induced mood disorder: Secondary | ICD-10-CM | POA: Diagnosis not present

## 2021-03-03 DIAGNOSIS — F314 Bipolar disorder, current episode depressed, severe, without psychotic features: Secondary | ICD-10-CM | POA: Diagnosis not present

## 2021-03-04 DIAGNOSIS — F314 Bipolar disorder, current episode depressed, severe, without psychotic features: Secondary | ICD-10-CM | POA: Diagnosis not present

## 2021-03-05 DIAGNOSIS — F314 Bipolar disorder, current episode depressed, severe, without psychotic features: Secondary | ICD-10-CM | POA: Diagnosis not present

## 2021-03-06 ENCOUNTER — Encounter (HOSPITAL_COMMUNITY): Payer: Self-pay | Admitting: Emergency Medicine

## 2021-03-06 ENCOUNTER — Other Ambulatory Visit: Payer: Self-pay

## 2021-03-06 ENCOUNTER — Encounter (HOSPITAL_COMMUNITY): Payer: Self-pay

## 2021-03-06 ENCOUNTER — Ambulatory Visit (HOSPITAL_COMMUNITY)
Admission: EM | Admit: 2021-03-06 | Discharge: 2021-03-09 | Disposition: A | Discharge status: Other Acute Inpatient Hospital | Payer: Medicaid Other | Attending: Urology | Admitting: Urology

## 2021-03-06 ENCOUNTER — Emergency Department (HOSPITAL_COMMUNITY)
Admission: EM | Admit: 2021-03-06 | Discharge: 2021-03-06 | Disposition: A | Discharge status: Other Acute Inpatient Hospital | Payer: No Typology Code available for payment source | Attending: Emergency Medicine | Admitting: Emergency Medicine

## 2021-03-06 DIAGNOSIS — F1010 Alcohol abuse, uncomplicated: Secondary | ICD-10-CM | POA: Insufficient documentation

## 2021-03-06 DIAGNOSIS — R45851 Suicidal ideations: Secondary | ICD-10-CM | POA: Diagnosis not present

## 2021-03-06 DIAGNOSIS — Z7982 Long term (current) use of aspirin: Secondary | ICD-10-CM | POA: Insufficient documentation

## 2021-03-06 DIAGNOSIS — F339 Major depressive disorder, recurrent, unspecified: Secondary | ICD-10-CM | POA: Insufficient documentation

## 2021-03-06 DIAGNOSIS — Z7902 Long term (current) use of antithrombotics/antiplatelets: Secondary | ICD-10-CM | POA: Diagnosis not present

## 2021-03-06 DIAGNOSIS — Z20822 Contact with and (suspected) exposure to covid-19: Secondary | ICD-10-CM | POA: Insufficient documentation

## 2021-03-06 DIAGNOSIS — I10 Essential (primary) hypertension: Secondary | ICD-10-CM | POA: Diagnosis not present

## 2021-03-06 DIAGNOSIS — Z79899 Other long term (current) drug therapy: Secondary | ICD-10-CM | POA: Diagnosis not present

## 2021-03-06 DIAGNOSIS — F314 Bipolar disorder, current episode depressed, severe, without psychotic features: Secondary | ICD-10-CM | POA: Diagnosis not present

## 2021-03-06 DIAGNOSIS — F1410 Cocaine abuse, uncomplicated: Secondary | ICD-10-CM | POA: Insufficient documentation

## 2021-03-06 DIAGNOSIS — F1994 Other psychoactive substance use, unspecified with psychoactive substance-induced mood disorder: Secondary | ICD-10-CM | POA: Insufficient documentation

## 2021-03-06 DIAGNOSIS — F17200 Nicotine dependence, unspecified, uncomplicated: Secondary | ICD-10-CM | POA: Diagnosis not present

## 2021-03-06 LAB — ETHANOL: Alcohol, Ethyl (B): 38 mg/dL — ABNORMAL HIGH (ref <10)

## 2021-03-06 LAB — CBC WITH DIFFERENTIAL/PLATELET
Abs Immature Granulocytes: 0.03 10*3/uL (ref 0.00–0.07)
Basophils Absolute: 0 10*3/uL (ref 0.0–0.1)
Basophils Relative: 1 %
Eosinophils Absolute: 0.1 10*3/uL (ref 0.0–0.5)
Eosinophils Relative: 2 %
HCT: 41.7 % (ref 39.0–52.0)
Hemoglobin: 13.8 g/dL (ref 13.0–17.0)
Immature Granulocytes: 1 %
Lymphocytes Relative: 28 %
Lymphs Abs: 1.5 10*3/uL (ref 0.7–4.0)
MCH: 28.9 pg (ref 26.0–34.0)
MCHC: 33.1 g/dL (ref 30.0–36.0)
MCV: 87.4 fL (ref 80.0–100.0)
Monocytes Absolute: 0.5 10*3/uL (ref 0.1–1.0)
Monocytes Relative: 10 %
Neutro Abs: 3.1 10*3/uL (ref 1.7–7.7)
Neutrophils Relative %: 58 %
Platelets: 209 10*3/uL (ref 150–400)
RBC: 4.77 MIL/uL (ref 4.22–5.81)
RDW: 13.7 % (ref 11.5–15.5)
WBC: 5.2 10*3/uL (ref 4.0–10.5)
nRBC: 0 % (ref 0.0–0.2)

## 2021-03-06 LAB — RAPID URINE DRUG SCREEN, HOSP PERFORMED
Amphetamines: NOT DETECTED
Barbiturates: NOT DETECTED
Benzodiazepines: NOT DETECTED
Cocaine: POSITIVE — AB
Opiates: NOT DETECTED
Tetrahydrocannabinol: NOT DETECTED

## 2021-03-06 LAB — COMPREHENSIVE METABOLIC PANEL
ALT: 21 U/L (ref 0–44)
AST: 22 U/L (ref 15–41)
Albumin: 3.8 g/dL (ref 3.5–5.0)
Alkaline Phosphatase: 55 U/L (ref 38–126)
Anion gap: 11 (ref 5–15)
BUN: 19 mg/dL (ref 8–23)
CO2: 27 mmol/L (ref 22–32)
Calcium: 9.2 mg/dL (ref 8.9–10.3)
Chloride: 98 mmol/L (ref 98–111)
Creatinine, Ser: 1.01 mg/dL (ref 0.61–1.24)
GFR, Estimated: >60 mL/min (ref >60)
Glucose, Bld: 122 mg/dL — ABNORMAL HIGH (ref 70–99)
Potassium: 3.6 mmol/L (ref 3.5–5.1)
Sodium: 136 mmol/L (ref 135–145)
Total Bilirubin: 0.6 mg/dL (ref 0.3–1.2)
Total Protein: 6.8 g/dL (ref 6.5–8.1)

## 2021-03-06 LAB — ACETAMINOPHEN LEVEL: Acetaminophen (Tylenol), Serum: <10 ug/mL — ABNORMAL LOW (ref 10–30)

## 2021-03-06 LAB — RESP PANEL BY RT-PCR (FLU A&B, COVID) ARPGX2
Influenza A by PCR: NEGATIVE
Influenza B by PCR: NEGATIVE
SARS Coronavirus 2 by RT PCR: NEGATIVE

## 2021-03-06 LAB — SALICYLATE LEVEL: Salicylate Lvl: <7 mg/dL — ABNORMAL LOW (ref 7.0–30.0)

## 2021-03-06 MED ORDER — MAGNESIUM HYDROXIDE 400 MG/5ML PO SUSP: 30.0000 mL | Freq: Every day | ORAL | Status: DC | PRN

## 2021-03-06 MED ORDER — ALUM & MAG HYDROXIDE-SIMETH 200-200-20 MG/5ML PO SUSP: 30.0000 mL | ORAL | Status: DC | PRN

## 2021-03-06 MED ORDER — TRAZODONE HCL 50 MG PO TABS
50.0000 mg | ORAL_TABLET | Freq: Every evening | ORAL | Status: DC | PRN
  Administered 2021-03-07 – 2021-03-08 (×2): 50 mg via ORAL
  Filled 2021-03-06 (×2): qty 1

## 2021-03-06 MED ORDER — ACETAMINOPHEN 325 MG PO TABS: 650.0000 mg | ORAL_TABLET | Freq: Four times a day (QID) | ORAL | Status: DC | PRN

## 2021-03-06 NOTE — ED Triage Notes (Signed)
Pt presents to the ED from home with complaints of SI. Pt states he has been depressed and wants to shoot himself. Hx of alcohol abuse, bipolar.

## 2021-03-06 NOTE — ED Notes (Signed)
Pt offered breakfast tray, he wants to continue sleeping at this time
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
## 2021-03-06 NOTE — Discharge Instructions (Addendum)
Transfer to [place] for inpatient psychiatric admission Dr. Nelda Marseille accepting

## 2021-03-06 NOTE — ED Notes (Signed)
Sitter at the bedside.

## 2021-03-06 NOTE — ED Notes (Signed)
Patient is getting undress at this  time

## 2021-03-06 NOTE — ED Notes (Signed)
Staffing called for [facility] sitter

## 2021-03-06 NOTE — ED Notes (Signed)
Pt AAOX4, transfer from [facility], pt presents with suicidal ideations, plan to shoot self with gun, pt admits to hearing voices to kill self.  Pt interactive with staff members, calm & cooperative.  Pt contracts for safety at present.  Monitoring for safety.

## 2021-03-06 NOTE — ED Notes (Signed)
Report was called at shift change to [place], Therapist, sports at [place] has arrived for transport over to facility. All pt belongings given to driver

## 2021-03-06 NOTE — ED Notes (Signed)
Pt is eating dinner and is calm and compliant at this time

## 2021-03-06 NOTE — ED Notes (Signed)
Pt undressed and in scrubs. Pt wanded per protocol. Valuables placed in envelope and sent with Security. Inventory done and belongings placed in locker #

## 2021-03-06 NOTE — ED Provider Notes (Signed)
[facility] EMERGENCY DEPARTMENT Provider Note   CSN: [id number] Arrival date & time: 03/06/21  8182     History  Chief Complaint  Patient presents with   Suicidal    Rolen Conger is a 66 y.o. male.  The history is provided by the patient and medical records.   66 year old male with history of PTSD, alcohol abuse, bipolar disorder, drug-induced mood disorder, hypertension, hyperlipidemia, presenting to the ED with suicidal ideation.  States he has thoughts of shooting himself.  He does not have a firearm at home, however states "I know plenty of people that do".  He has not made any attempts at self-harm.  Does admit to smoking some crack cocaine last evening.  He denies any alcohol abuse.  Admits to some auditory hallucination but denies visual hallucinations.  No homicidal ideation.  Does have history of depression and is prescribed medication but has not been taking it.  Home Medications Prior to Admission medications   Medication Sig Start Date End Date Taking? Authorizing Provider  albuterol (VENTOLIN HFA) 108 (90 Base) MCG/ACT inhaler Inhale 1-2 puffs into the lungs every 6 (six) hours as needed for wheezing or shortness of breath. 12/16/20 01/25/21  Maida Sale, MD  ARIPiprazole (ABILIFY) 10 MG tablet Take 1 tablet (10 mg total) by mouth daily. Bipolar disorder 01/13/21   Nicholes Rough, NP  aspirin EC 81 MG EC tablet Take 1 tablet (81 mg total) by mouth daily. Blood vessel disease 01/13/21   Nicholes Rough, NP  clopidogrel (PLAVIX) 75 MG tablet Take 1 tablet (75 mg total) by mouth daily. Prevention of blood clots 01/13/21   Nicholes Rough, NP  DULoxetine (CYMBALTA) 30 MG capsule Take 1 capsule (30 mg total) by mouth daily. Depression 01/13/21   Nicholes Rough, NP  gabapentin (NEURONTIN) 300 MG capsule Take 1 capsule (300 mg total) by mouth 3 (three) times daily. Neuropathy 01/12/21   Nicholes Rough, NP  hydrOXYzine (ATARAX/VISTARIL) 25 MG tablet  Take 1 tablet (25 mg total) by mouth 3 (three) times daily as needed for anxiety. anxiety 01/12/21   Nicholes Rough, NP  metFORMIN (FORTAMET) 1000 MG (OSM) 24 hr tablet Take 1 tablet (1,000 mg total) by mouth daily with supper. Diabetes 01/12/21   Nicholes Rough, NP  metFORMIN (GLUCOPHAGE-XR) 500 MG 24 hr tablet Take 1 tablet (500 mg total) by mouth daily with breakfast. diabetes Patient not taking: Reported on 01/25/2021 01/13/21   Nicholes Rough, NP  metoprolol succinate (TOPROL-XL) 25 MG 24 hr tablet Take 1 tablet (25 mg total) by mouth daily. Elevated blood pressure 01/13/21   Nicholes Rough, NP  Multiple Vitamin (MULTIVITAMIN WITH MINERALS) TABS tablet Take 1 tablet by mouth daily as needed (for supplementation).    [provider]  nicotine polacrilex (NICORETTE) 2 MG gum Take 1 each (2 mg total) by mouth as needed for smoking cessation. 12/16/20   Maida Sale, MD  pantoprazole (PROTONIX) 40 MG tablet Take 1 tablet (40 mg total) by mouth daily. Acid reflux disease 01/12/21 03/13/21  Nicholes Rough, NP  rosuvastatin (CRESTOR) 40 MG tablet Take 1 tablet (40 mg total) by mouth daily. Elevated cholesterol 01/12/21   Nicholes Rough, NP  traZODone (DESYREL) 50 MG tablet Take 50 mg by mouth at bedtime as needed for sleep.    [provider]      Allergies    Patient has no active allergies.    Review of Systems   Review of Systems  Psychiatric/Behavioral:  Positive for suicidal  ideas.   All other systems reviewed and are negative.  Physical Exam Updated Vital Signs BP 126/72 (BP Location: Right Arm)    Pulse 78    Temp 98 F (36.7 C) (Oral)    Resp 17    SpO2 92%   Physical Exam Vitals and nursing note reviewed.  Constitutional:      Appearance: He is well-developed.  HENT:     Head: Normocephalic and atraumatic.  Eyes:     Conjunctiva/sclera: Conjunctivae normal.     Pupils: Pupils are equal, round, and reactive to light.  Cardiovascular:     Rate and  Rhythm: Normal rate and regular rhythm.     Heart sounds: Normal heart sounds.  Pulmonary:     Effort: Pulmonary effort is normal. No respiratory distress.     Breath sounds: Normal breath sounds. No rhonchi.  Abdominal:     General: Bowel sounds are normal.     Palpations: Abdomen is soft.     Tenderness: There is no abdominal tenderness. There is no rebound.  Musculoskeletal:        General: Normal range of motion.     Cervical back: Normal range of motion.  Skin:    General: Skin is warm and dry.  Neurological:     Mental Status: He is alert and oriented to person, place, and time.  Psychiatric:        Attention and Perception: He perceives auditory hallucinations. He does not perceive visual hallucinations.        Thought Content: Thought content includes suicidal ideation. Thought content does not include homicidal ideation. Thought content includes suicidal plan. Thought content does not include homicidal plan.     Comments: SI with plan to shoot himself Admits to auditory hallucinations, denies auditory  Denies HI    ED Results / Procedures / Treatments   Labs (all labs ordered are listed, but only abnormal results are displayed) Labs Reviewed  RAPID URINE DRUG SCREEN, HOSP PERFORMED - Abnormal; Notable for the following components:      Result Value   Cocaine POSITIVE (*)    All other components within normal limits  RESP PANEL BY RT-PCR (FLU A&B, COVID) ARPGX2  CBC WITH DIFFERENTIAL/PLATELET  COMPREHENSIVE METABOLIC PANEL  ETHANOL  SALICYLATE LEVEL  ACETAMINOPHEN LEVEL    EKG None  Radiology No results found.  Procedures Procedures    Medications Ordered in ED Medications - No data to display  ED Course/ Medical Decision Making/ A&P                           Medical Decision Making Amount and/or Complexity of Data Reviewed Labs: ordered.  66 y.o. M here with SI.  Plan to shoot himself, does not have firearms at home but states "I know plenty of  people that have them".  Admits to crack cocaine use last evening.  Denies HI/active hallucinations.  Medical screening labs sent and are pending.  Care will be signed out to oncoming team to follow-up on labs and medically clear.  Will get TTS consult.  Final Clinical Impression(s) / ED Diagnoses Final diagnoses:  Suicidal ideation    Rx / DC Orders ED Discharge Orders     None         Larene Pickett, PA-C 03/06/21 2229    Orpah Greek, MD 03/06/21 0730

## 2021-03-06 NOTE — ED Provider Notes (Signed)
°  Physical Exam  BP 126/72 (BP Location: Right Arm)    Pulse 78    Temp 98 F (36.7 C) (Oral)    Resp 17    Ht 5\' 9"  (1.753 m)    Wt 77 kg    SpO2 92%    BMI 25.07 kg/m   Physical Exam  Procedures  Procedures  ED Course / MDM    Medical Decision Making Amount and/or Complexity of Data Reviewed Labs: ordered.   Labs reviewed.  Interpreted.  Reassuring.  Patient is medically cleared       Davonna Belling, MD 03/06/21 [phone]

## 2021-03-06 NOTE — ED Notes (Signed)
Per [facility] provider, Darrol Angel, NP, patient accepted to the [facility] for overnight observation. [place] to assist with transfer.

## 2021-03-06 NOTE — ED Notes (Signed)
Breakfast tray order, pt is sleeping at this time

## 2021-03-06 NOTE — ED Provider Notes (Signed)
Behavioral Health Admission [facility]&P [facility] & OBS)  Date: 03/06/21 Patient Name: [name] MRN: [id number] Chief Complaint:  Chief Complaint  Patient presents with   Suicidal      Diagnoses:  Final diagnoses:  Bipolar affective disorder, depressed, severe ([place])  Suicidal ideation  Cocaine substance abuse ([place])  Alcohol abuse    HPI: [name] is a 66 year old male with psychiatric history of bipolar affective disorder, depression, alcohol abuse, cocaine abuse, and suicidal ideation.  [facility] presented to [facility] with complaint of suicidal ideation; patient was evaluated and recommended for continuous assessment here at [facility].  Patient was seen face-to-face upon arrival to [facility] and his chart was reviewed by this provider.  Patient is alert and oriented x4.  He is calm and cooperative.  He is speaking in a normal tone of voice at moderate rate.  Patient reported his mood as depressed, his affect is congruent with his stated mood.  Patient's thought process is coherent and relevant.  Patient reported that he is experiencing worsening depression and suicidal ideation with plans to shoot himself.  Patient reported that he has had suicidal ideation since July 2022.  He reported that his suicidal ideation has worsened over the past 2 days.  Patient states that he has access to firearms; " I know plenty of people that have guns."  Patient states that he has history of 2 suicidal attempts (1990 and early 2000).  Patient says he is experiencing depressive symptoms of isolation, crying spells, irritability, guilt, hopelessness, worthlessness, restlessness, poor appetite, and poor sleep.   Patient endorses passive homicidal ideation towards "somebody I know." He denies plan or intent.  He denies paranoia and visual hallucination.  Patient endorses auditory hallucination of " voices that say kill myself."  He endorses substance abuse.  Patient reports using $50 worth of crack cocaine daily.   Patient says he has been using crack cocaine since 1987.  Patient also endorses daily alcohol abuse; he reports consuming 6 packs of beer daily, last drink was on 03/05/21.  Patient reports that he has been drinking alcohol since age 29. Patient reports that he is interested in substance abuse treatment program.   PHQ 2-9:  [place] ED from 01/04/2021 in [place]  Thoughts that you would be better off dead, or of hurting yourself in some way More than half the days  PHQ-9 Total Score 12       [place] ED from 03/06/2021 in [facility] Most recent reading at 03/06/2021  9:58 PM ED from 03/06/2021 in [place] Most recent reading at 03/06/2021  6:40 PM ED from 01/25/2021 in [place] Most recent reading at 01/27/2021 10:18 AM  C-SSRS RISK CATEGORY High Risk High Risk High Risk        Total Time spent with patient: 15 minutes  Musculoskeletal  Strength & Muscle Tone: within normal limits Gait & Station: normal Patient leans: Right  Psychiatric Specialty Exam  Presentation General Appearance: Fairly Groomed  Eye Contact:Good  Speech:Clear and Coherent  Speech Volume:Normal  Handedness:Right   Mood and Affect  Mood:Depressed  Affect:Congruent   Thought Process  Thought Processes:Coherent  Descriptions of Associations:Intact  Orientation:Full (Time, Place and Person)  Thought Content:WDL  Diagnosis of Schizophrenia or Schizoaffective disorder in past: No  Duration of Psychotic Symptoms: Greater than six months  Hallucinations:Hallucinations: Auditory Description of Auditory Hallucinations: "Voices that says kill myself."  Ideas of Reference:None  Suicidal Thoughts:Suicidal Thoughts: Yes, Active SI Active Intent and/or Plan: With Plan  Homicidal Thoughts:Homicidal Thoughts: Yes, Passive HI Passive Intent  and/or Plan: Without Plan; Without Intent   Sensorium  Memory:Immediate Good; Remote Good; Recent Good  Judgment:Fair  Insight:Good   Executive Functions  Concentration:Good  Attention Span:Good  Recall:Good  Fund of Knowledge:Good  Language:Good   Psychomotor Activity  Psychomotor Activity:Psychomotor Activity: Normal   Assets  Assets:Communication Skills; Desire for Improvement; Physical Health   Sleep  Sleep:Sleep: Poor Number of Hours of Sleep: 5   Nutritional Assessment (For OBS and FBC admissions only) Has the patient had a weight loss or gain of 10 pounds or more in the last 3 months?: Yes Has the patient had a decrease in food intake/or appetite?: Yes Does the patient have dental problems?: No Does the patient have eating habits or behaviors that may be indicators of an eating disorder including binging or inducing vomiting?: No Has the patient recently lost weight without trying?: 1 Has the patient been eating poorly because of a decreased appetite?: 0 Malnutrition Screening Tool Score: 1    Physical Exam Vitals and nursing note reviewed.  Constitutional:      General: He is not in acute distress.    Appearance: He is well-developed.  HENT:     Head: Normocephalic and atraumatic.  Eyes:     Conjunctiva/sclera: Conjunctivae normal.  Cardiovascular:     Rate and Rhythm: Normal rate.  Pulmonary:     Effort: Pulmonary effort is normal. No respiratory distress.     Breath sounds: Normal breath sounds.  Abdominal:     Palpations: Abdomen is soft.     Tenderness: There is no abdominal tenderness.  Musculoskeletal:        General: No swelling. Normal range of motion.     Cervical back: Neck supple.  Skin:    General: Skin is warm and dry.     Capillary Refill: Capillary refill takes less than 2 seconds.  Neurological:     Mental Status: He is alert and oriented to person, place, and time.  Psychiatric:        Attention and Perception: He  perceives auditory hallucinations.        Mood and Affect: Mood normal.        Speech: Speech normal.        Behavior: Behavior normal. Behavior is cooperative.        Thought Content: Thought content includes homicidal and suicidal ideation. Thought content includes suicidal plan. Thought content does not include homicidal plan.        Cognition and Memory: Cognition normal.   Review of Systems  Constitutional: Negative.   HENT: Negative.    Eyes: Negative.   Respiratory: Negative.    Cardiovascular: Negative.   Gastrointestinal: Negative.   Genitourinary: Negative.   Musculoskeletal: Negative.   Skin: Negative.   Neurological: Negative.   Endo/Heme/Allergies: Negative.   Psychiatric/Behavioral:  Positive for depression, hallucinations, substance abuse and suicidal ideas. The patient is nervous/anxious.    Blood pressure (!) 141/69, pulse 87, temperature 98.8 F (37.1 C), temperature source Oral, resp. rate 18, SpO2 100 %. There is no height or weight on file to calculate BMI.  Past Psychiatric History: Bipolar affective disorder, crack cocaine abuse, alcohol abuse, depression, substance induced mood disorder  Is the patient at risk to self? Yes  Has the patient been a risk to self in the past 6 months? Yes .    Has the patient been a  risk to self within the distant past? Yes   Is the patient a risk to others? No   Has the patient been a risk to others in the past 6 months? No   Has the patient been a risk to others within the distant past? Yes   Past Medical History:  Past Medical History:  Diagnosis Date   Anxiety    Arthritis    Cancer ([place])    Cataract    Depression    Diabetes mellitus    GERD (gastroesophageal reflux disease)    H/O suicide attempt 1997 and 2011   jumped out of moving vehicle and attempted to jump into moving traffic   High cholesterol    Hypertension    Neuropathic pain    Osteoporosis     Past Surgical History:  Procedure Laterality Date    COLONOSCOPY     CORONARY STENT INTERVENTION N/A 10/10/2020   Procedure: CORONARY STENT INTERVENTION;  Surgeon: Jettie Booze, MD;  Location: [facility];  Service: Cardiovascular;  Laterality: N/A;   LEFT HEART CATH AND CORONARY ANGIOGRAPHY N/A 10/10/2020   Procedure: LEFT HEART CATH AND CORONARY ANGIOGRAPHY;  Surgeon: Jettie Booze, MD;  Location: [facility];  Service: Cardiovascular;  Laterality: N/A;   NEPHRECTOMY     due to cancer   RIGHT/LEFT HEART CATH AND CORONARY ANGIOGRAPHY N/A 10/09/2020   Procedure: RIGHT/LEFT HEART CATH AND CORONARY ANGIOGRAPHY;  Surgeon: Nelva Bush, MD;  Location: [facility];  Service: Cardiovascular;  Laterality: N/A;    Family History:  Family History  Problem Relation Age of Onset   Alcohol abuse Sister    Alcohol abuse Brother    Colon cancer Neg Hx    Esophageal cancer Neg Hx    Pancreatic cancer Neg Hx    Rectal cancer Neg Hx    Stomach cancer Neg Hx     Social History:  Social History   Socioeconomic History   Marital status: Single    Spouse name: Not on file   Number of children: Not on file   Years of education: Not on file   Highest education level: Not on file  Occupational History   Not on file  Tobacco Use   Smoking status: Every Day    Packs/day: 0.50    Years: 20.00    Pack years: 10.00    Types: Cigarettes   Smokeless tobacco: Never  Vaping Use   Vaping Use: Never used  Substance and Sexual Activity   Alcohol use: Yes    Alcohol/week: 3.0 standard drinks    Types: 3 Cans of beer per week    Comment: Pt has been clean for 8 months   Drug use: Yes    Types: "Crack" cocaine, Cocaine   Sexual activity: Yes    Birth control/protection: Condom  Other Topics Concern   Not on file  Social History Narrative   Lives alone.  Has sister in area.   Social Determinants of Health   Financial Resource Strain: Not on file  Food Insecurity: Not on file  Transportation Needs: Not on file   Physical Activity: Not on file  Stress: Not on file  Social Connections: Not on file  Intimate Partner Violence: Not on file    SDOH:  SDOH Screenings   Alcohol Screen: Medium Risk   Last Alcohol Screening Score (AUDIT): 13  Depression (PHQ2-9): Medium Risk   PHQ-2 Score: 12  Financial Resource Strain: Not on file  Food  Insecurity: Not on file  Housing: Not on file  Physical Activity: Not on file  Social Connections: Not on file  Stress: Not on file  Tobacco Use: High Risk   Smoking Tobacco Use: Every Day   Smokeless Tobacco Use: Never   Passive Exposure: Not on file  Transportation Needs: Not on file    Last Labs:  Admission on 03/06/2021, Discharged on 03/06/2021  Component Date Value Ref Range Status   WBC 03/06/2021 5.2  4.0 - 10.5 K/uL Final   RBC 03/06/2021 4.77  4.22 - 5.81 MIL/uL Final   Hemoglobin 03/06/2021 13.8  13.0 - 17.0 g/dL Final   HCT 03/06/2021 41.7  39.0 - 52.0 % Final   MCV 03/06/2021 87.4  80.0 - 100.0 fL Final   MCH 03/06/2021 28.9  26.0 - 34.0 pg Final   MCHC 03/06/2021 33.1  30.0 - 36.0 g/dL Final   RDW 03/06/2021 13.7  11.5 - 15.5 % Final   Platelets 03/06/2021 209  150 - 400 K/uL Final   nRBC 03/06/2021 0.0  0.0 - 0.2 % Final   Neutrophils Relative % 03/06/2021 58  % Final   Neutro Abs 03/06/2021 3.1  1.7 - 7.7 K/uL Final   Lymphocytes Relative 03/06/2021 28  % Final   Lymphs Abs 03/06/2021 1.5  0.7 - 4.0 K/uL Final   Monocytes Relative 03/06/2021 10  % Final   Monocytes Absolute 03/06/2021 0.5  0.1 - 1.0 K/uL Final   Eosinophils Relative 03/06/2021 2  % Final   Eosinophils Absolute 03/06/2021 0.1  0.0 - 0.5 K/uL Final   Basophils Relative 03/06/2021 1  % Final   Basophils Absolute 03/06/2021 0.0  0.0 - 0.1 K/uL Final   Immature Granulocytes 03/06/2021 1  % Final   Abs Immature Granulocytes 03/06/2021 0.03  0.00 - 0.07 K/uL Final   Performed at [facility], [place] [address] [place], [place] [address]   Sodium 03/06/2021 136  135 -  145 mmol/L Final   Potassium 03/06/2021 3.6  3.5 - 5.1 mmol/L Final   Chloride 03/06/2021 98  98 - 111 mmol/L Final   CO2 03/06/2021 27  22 - 32 mmol/L Final   Glucose, Bld 03/06/2021 122 (H)  70 - 99 mg/dL Final   Glucose reference range applies only to samples taken after fasting for at least 8 hours.   BUN 03/06/2021 19  8 - 23 mg/dL Final   Creatinine, Ser 03/06/2021 1.01  0.61 - 1.24 mg/dL Final   Calcium 03/06/2021 9.2  8.9 - 10.3 mg/dL Final   Total Protein 03/06/2021 6.8  6.5 - 8.1 g/dL Final   Albumin 03/06/2021 3.8  3.5 - 5.0 g/dL Final   AST 03/06/2021 22  15 - 41 U/L Final   ALT 03/06/2021 21  0 - 44 U/L Final   Alkaline Phosphatase 03/06/2021 55  38 - 126 U/L Final   Total Bilirubin 03/06/2021 0.6  0.3 - 1.2 mg/dL Final   GFR, Estimated 03/06/2021 >60  >60 mL/min Final   Comment: (NOTE) Calculated using the CKD-EPI Creatinine Equation (2021)    Anion gap 03/06/2021 11  5 - 15 Final   Performed at [facility] [address] [place], [place] [address]   Alcohol, Ethyl (B) 03/06/2021 38 (H)  <10 mg/dL Final   Comment: (NOTE) Lowest detectable limit for serum alcohol is 10 mg/dL.  For medical purposes only. Performed at [facility], [place] [address], [place], [place] [address]  Opiates 03/06/2021 NONE DETECTED  NONE DETECTED Final   Cocaine 03/06/2021 POSITIVE (A)  NONE DETECTED Final   Benzodiazepines 03/06/2021 NONE DETECTED  NONE DETECTED Final   Amphetamines 03/06/2021 NONE DETECTED  NONE DETECTED Final   Tetrahydrocannabinol 03/06/2021 NONE DETECTED  NONE DETECTED Final   Barbiturates 03/06/2021 NONE DETECTED  NONE DETECTED Final   Comment: (NOTE) DRUG SCREEN FOR MEDICAL PURPOSES ONLY.  IF CONFIRMATION IS NEEDED FOR ANY PURPOSE, NOTIFY LAB WITHIN 5 DAYS.  LOWEST DETECTABLE LIMITS FOR URINE DRUG SCREEN Drug Class                     Cutoff (ng/mL) Amphetamine and metabolites    1000 Barbiturate and metabolites    200 Benzodiazepine                  938 Tricyclics and metabolites     300 Opiates and metabolites        300 Cocaine and metabolites        300 THC                            50 Performed at [facility], [place] [address], [place], [place] [address]    Salicylate Lvl 12/09/8525 <7.0 (L)  7.0 - 30.0 mg/dL Final   Performed at [facility] [address], [place], [place] [address]   Acetaminophen (Tylenol), Serum 03/06/2021 <10 (L)  10 - 30 ug/mL Final   Comment: (NOTE) Therapeutic concentrations vary significantly. A range of 10-30 ug/mL  may be an effective concentration for many patients. However, some  are best treated at concentrations outside of this range. Acetaminophen concentrations >150 ug/mL at 4 hours after ingestion  and >50 ug/mL at 12 hours after ingestion are often associated with  toxic reactions.  Performed at [facility], [place] [address], [place], [place] [address]    SARS Coronavirus 2 by RT PCR 03/06/2021 NEGATIVE  NEGATIVE Final   Comment: (NOTE) SARS-CoV-2 target nucleic acids are NOT DETECTED.  The SARS-CoV-2 RNA is generally detectable in upper respiratory specimens during the acute phase of infection. The lowest concentration of SARS-CoV-2 viral copies this assay can detect is 138 copies/mL. A negative result does not preclude SARS-Cov-2 infection and should not be used as the sole basis for treatment or other patient management decisions. A negative result may occur with  improper specimen collection/handling, submission of specimen other than nasopharyngeal swab, presence of viral mutation(s) within the areas targeted by this assay, and inadequate number of viral copies(<138 copies/mL). A negative result must be combined with clinical observations, patient history, and epidemiological information. The expected result is Negative.  Fact Sheet for Patients:  EntrepreneurPulse.com.au  Fact Sheet for Healthcare Providers:   IncredibleEmployment.be  This test is no                          t yet approved or cleared by the [place] FDA and  has been authorized for detection and/or diagnosis of SARS-CoV-2 by FDA under an Emergency Use Authorization (EUA). This EUA will remain  in effect (meaning this test can be used) for the duration of the COVID-19 declaration under Section 564(b)(1) of the Act, 21 U.S.C.section 360bbb-3(b)(1), unless the authorization is terminated  or revoked sooner.       Influenza A by PCR 03/06/2021 NEGATIVE  NEGATIVE Final   Influenza B  by PCR 03/06/2021 NEGATIVE  NEGATIVE Final   Comment: (NOTE) The Xpert Xpress SARS-CoV-2/FLU/RSV plus assay is intended as an aid in the diagnosis of influenza from Nasopharyngeal swab specimens and should not be used as a sole basis for treatment. Nasal washings and aspirates are unacceptable for Xpert Xpress SARS-CoV-2/FLU/RSV testing.  Fact Sheet for Patients: EntrepreneurPulse.com.au  Fact Sheet for Healthcare Providers: IncredibleEmployment.be  This test is not yet approved or cleared by the [place] FDA and has been authorized for detection and/or diagnosis of SARS-CoV-2 by FDA under an Emergency Use Authorization (EUA). This EUA will remain in effect (meaning this test can be used) for the duration of the COVID-19 declaration under Section 564(b)(1) of the Act, 21 U.S.C. section 360bbb-3(b)(1), unless the authorization is terminated or revoked.  Performed at [facility], [place] [address] [place], [place] [address]   Admission on 01/25/2021, Discharged on 01/28/2021  Component Date Value Ref Range Status   Sodium 01/25/2021 138  135 - 145 mmol/L Final   Potassium 01/25/2021 4.4  3.5 - 5.1 mmol/L Final   Chloride 01/25/2021 104  98 - 111 mmol/L Final   CO2 01/25/2021 27  22 - 32 mmol/L Final   Glucose, Bld 01/25/2021 152 (H)  70 - 99 mg/dL Final   Glucose  reference range applies only to samples taken after fasting for at least 8 hours.   BUN 01/25/2021 13  8 - 23 mg/dL Final   Creatinine, Ser 01/25/2021 0.91  0.61 - 1.24 mg/dL Final   Calcium 01/25/2021 8.8 (L)  8.9 - 10.3 mg/dL Final   Total Protein 01/25/2021 6.2 (L)  6.5 - 8.1 g/dL Final   Albumin 01/25/2021 3.4 (L)  3.5 - 5.0 g/dL Final   AST 01/25/2021 24  15 - 41 U/L Final   ALT 01/25/2021 20  0 - 44 U/L Final   Alkaline Phosphatase 01/25/2021 58  38 - 126 U/L Final   Total Bilirubin 01/25/2021 0.6  0.3 - 1.2 mg/dL Final   GFR, Estimated 01/25/2021 >60  >60 mL/min Final   Comment: (NOTE) Calculated using the CKD-EPI Creatinine Equation (2021)    Anion gap 01/25/2021 7  5 - 15 Final   Performed at [facility] [address] [place], [place] [address]   Alcohol, Ethyl (B) 01/25/2021 <10  <10 mg/dL Final   Comment: (NOTE) Lowest detectable limit for serum alcohol is 10 mg/dL.  For medical purposes only. Performed at [facility], [place] [address] [place], [place] [address]    Salicylate Lvl 91/47/8295 <7.0 (L)  7.0 - 30.0 mg/dL Final   Performed at [facility] [address] [place], [place] [address]   Acetaminophen (Tylenol), Serum 01/25/2021 <10 (L)  10 - 30 ug/mL Final   Comment: (NOTE) Therapeutic concentrations vary significantly. A range of 10-30 ug/mL  may be an effective concentration for many patients. However, some  are best treated at concentrations outside of this range. Acetaminophen concentrations >150 ug/mL at 4 hours after ingestion  and >50 ug/mL at 12 hours after ingestion are often associated with  toxic reactions.  Performed at [facility], [place] [address], [place], [place] [address]    WBC 01/25/2021 5.1  4.0 - 10.5 K/uL Final   RBC 01/25/2021 4.83  4.22 - 5.81 MIL/uL Final   Hemoglobin 01/25/2021 13.7  13.0 - 17.0 g/dL Final   HCT 01/25/2021 42.8  39.0 - 52.0 % Final   MCV 01/25/2021 88.6  80.0 - 100.0 fL Final   MCH  01/25/2021 28.4  26.0 - 34.0 pg Final   MCHC 01/25/2021 32.0  30.0 - 36.0 g/dL Final   RDW 01/25/2021 14.0  11.5 - 15.5 % Final   Platelets 01/25/2021 173  150 - 400 K/uL Final   nRBC 01/25/2021 0.0  0.0 - 0.2 % Final   Performed at [facility], [place] [address], [place], [place] [address]   Opiates 01/25/2021 NONE DETECTED  NONE DETECTED Final   Cocaine 01/25/2021 POSITIVE (A)  NONE DETECTED Final   Benzodiazepines 01/25/2021 NONE DETECTED  NONE DETECTED Final   Amphetamines 01/25/2021 NONE DETECTED  NONE DETECTED Final   Tetrahydrocannabinol 01/25/2021 POSITIVE (A)  NONE DETECTED Final   Barbiturates 01/25/2021 NONE DETECTED  NONE DETECTED Final   Comment: (NOTE) DRUG SCREEN FOR MEDICAL PURPOSES ONLY.  IF CONFIRMATION IS NEEDED FOR ANY PURPOSE, NOTIFY LAB WITHIN 5 DAYS.  LOWEST DETECTABLE LIMITS FOR URINE DRUG SCREEN Drug Class                     Cutoff (ng/mL) Amphetamine and metabolites    1000 Barbiturate and metabolites    200 Benzodiazepine                 924 Tricyclics and metabolites     300 Opiates and metabolites        300 Cocaine and metabolites        300 THC                            50 Performed at [facility], [place] [address], [place], [place] [address]    SARS Coronavirus 2 by RT PCR 01/25/2021 NEGATIVE  NEGATIVE Final   Comment: (NOTE) SARS-CoV-2 target nucleic acids are NOT DETECTED.  The SARS-CoV-2 RNA is generally detectable in upper respiratory specimens during the acute phase of infection. The lowest concentration of SARS-CoV-2 viral copies this assay can detect is 138 copies/mL. A negative result does not preclude SARS-Cov-2 infection and should not be used as the sole basis for treatment or other patient management decisions. A negative result may occur with  improper specimen collection/handling, submission of specimen other than nasopharyngeal swab, presence of viral mutation(s) within the areas targeted by this assay, and  inadequate number of viral copies(<138 copies/mL). A negative result must be combined with clinical observations, patient history, and epidemiological information. The expected result is Negative.  Fact Sheet for Patients:  EntrepreneurPulse.com.au  Fact Sheet for Healthcare Providers:  IncredibleEmployment.be  This test is no                          t yet approved or cleared by the [place] FDA and  has been authorized for detection and/or diagnosis of SARS-CoV-2 by FDA under an Emergency Use Authorization (EUA). This EUA will remain  in effect (meaning this test can be used) for the duration of the COVID-19 declaration under Section 564(b)(1) of the Act, 21 U.S.C.section 360bbb-3(b)(1), unless the authorization is terminated  or revoked sooner.       Influenza A by PCR 01/25/2021 NEGATIVE  NEGATIVE Final   Influenza B by PCR 01/25/2021 NEGATIVE  NEGATIVE Final   Comment: (NOTE) The Xpert Xpress SARS-CoV-2/FLU/RSV plus assay is intended as an aid in the diagnosis of influenza from Nasopharyngeal swab specimens and should not be used as a sole  basis for treatment. Nasal washings and aspirates are unacceptable for Xpert Xpress SARS-CoV-2/FLU/RSV testing.  Fact Sheet for Patients: BloggerCourse.com  Fact Sheet for Healthcare Providers: SeriousBroker.it  This test is not yet approved or cleared by the [place] FDA and has been authorized for detection and/or diagnosis of SARS-CoV-2 by FDA under an Emergency Use Authorization (EUA). This EUA will remain in effect (meaning this test can be used) for the duration of the COVID-19 declaration under Section 564(b)(1) of the Act, 21 U.S.C. section 360bbb-3(b)(1), unless the authorization is terminated or revoked.  Performed at [facility], [address], [place], [place] [address]    Glucose-Capillary 01/25/2021 169 (H)  70 -  99 mg/dL Final   Glucose reference range applies only to samples taken after fasting for at least 8 hours.  Admission on 01/06/2021, Discharged on 01/12/2021  Component Date Value Ref Range Status   Glucose-Capillary 01/07/2021 180 (H)  70 - 99 mg/dL Final   Glucose reference range applies only to samples taken after fasting for at least 8 hours.   Sodium 01/07/2021 137  135 - 145 mmol/L Final   Potassium 01/07/2021 4.3  3.5 - 5.1 mmol/L Final   Chloride 01/07/2021 105  98 - 111 mmol/L Final   CO2 01/07/2021 28  22 - 32 mmol/L Final   Glucose, Bld 01/07/2021 178 (H)  70 - 99 mg/dL Final   Glucose reference range applies only to samples taken after fasting for at least 8 hours.   BUN 01/07/2021 15  8 - 23 mg/dL Final   Creatinine, Ser 01/07/2021 0.87  0.61 - 1.24 mg/dL Final   Calcium 44/82/4454 8.9  8.9 - 10.3 mg/dL Final   GFR, Estimated 01/07/2021 >60  >60 mL/min Final   Comment: (NOTE) Calculated using the CKD-EPI Creatinine Equation (2021)    Anion gap 01/07/2021 4 (L)  5 - 15 Final   Performed at [facility], [address], [place], [place] [address]   Glucose-Capillary 01/08/2021 131 (H)  70 - 99 mg/dL Final   Glucose reference range applies only to samples taken after fasting for at least 8 hours.   Comment 1 01/08/2021 Notify RN   Final   Glucose-Capillary 01/09/2021 135 (H)  70 - 99 mg/dL Final   Glucose reference range applies only to samples taken after fasting for at least 8 hours.   Glucose-Capillary 01/10/2021 158 (H)  70 - 99 mg/dL Final   Glucose reference range applies only to samples taken after fasting for at least 8 hours.   Glucose-Capillary 01/11/2021 148 (H)  70 - 99 mg/dL Final   Glucose reference range applies only to samples taken after fasting for at least 8 hours.   Comment 1 01/11/2021 Notify RN   Final   Comment 2 01/11/2021 Document in Chart   Final   Glucose-Capillary 01/12/2021 126 (H)  70 - 99 mg/dL Final   Glucose reference  range applies only to samples taken after fasting for at least 8 hours.   Comment 1 01/12/2021 Notify RN   Final   Comment 2 01/12/2021 Document in Chart   Final  Admission on 01/05/2021, Discharged on 01/06/2021  Component Date Value Ref Range Status   Glucose-Capillary 01/06/2021 123 (H)  70 - 99 mg/dL Final   Glucose reference range applies only to samples taken after fasting for at least 8 hours.   SARSCOV2ONAVIRUS 2 AG 01/06/2021 NEGATIVE  NEGATIVE Final   Comment: (NOTE) SARS-CoV-2 antigen NOT DETECTED.  Negative results are presumptive.  Negative results do not preclude SARS-CoV-2 infection and should not be used as the sole basis for treatment or other patient management decisions, including infection  control decisions, particularly in the presence of clinical signs and  symptoms consistent with COVID-19, or in those who have been in contact with the virus.  Negative results must be combined with clinical observations, patient history, and epidemiological information. The expected result is Negative.  Fact Sheet for Patients: HandmadeRecipes.com.cy  Fact Sheet for Healthcare Providers: FuneralLife.at  This test is not yet approved or cleared by the [place] FDA and  has been authorized for detection and/or diagnosis of SARS-CoV-2 by FDA under an Emergency Use Authorization (EUA).  This EUA will remain in effect (meaning this test can be used) for the duration of  the COV                          ID-19 declaration under Section 564(b)(1) of the Act, 21 U.S.C. section 360bbb-3(b)(1), unless the authorization is terminated or revoked sooner.    Admission on 01/04/2021, Discharged on 01/05/2021  Component Date Value Ref Range Status   Sodium 01/04/2021 134 (L)  135 - 145 mmol/L Final   Potassium 01/04/2021 3.7  3.5 - 5.1 mmol/L Final   Chloride 01/04/2021 104  98 - 111 mmol/L Final   CO2 01/04/2021 25  22 - 32 mmol/L  Final   Glucose, Bld 01/04/2021 145 (H)  70 - 99 mg/dL Final   Glucose reference range applies only to samples taken after fasting for at least 8 hours.   BUN 01/04/2021 13  8 - 23 mg/dL Final   Creatinine, Ser 01/04/2021 0.95  0.61 - 1.24 mg/dL Final   Calcium 01/04/2021 8.9  8.9 - 10.3 mg/dL Final   Total Protein 01/04/2021 6.7  6.5 - 8.1 g/dL Final   Albumin 01/04/2021 3.7  3.5 - 5.0 g/dL Final   AST 01/04/2021 25  15 - 41 U/L Final   ALT 01/04/2021 23  0 - 44 U/L Final   Alkaline Phosphatase 01/04/2021 64  38 - 126 U/L Final   Total Bilirubin 01/04/2021 0.5  0.3 - 1.2 mg/dL Final   GFR, Estimated 01/04/2021 >60  >60 mL/min Final   Comment: (NOTE) Calculated using the CKD-EPI Creatinine Equation (2021)    Anion gap 01/04/2021 5  5 - 15 Final   Performed at [facility] [address] [place], [place] [address]   Alcohol, Ethyl (B) 01/04/2021 <10  <10 mg/dL Final   Comment: (NOTE) Lowest detectable limit for serum alcohol is 10 mg/dL.  For medical purposes only. Performed at [facility], [place] [address], [place], [place] [address]    Salicylate Lvl 51/76/1607 <7.0 (L)  7.0 - 30.0 mg/dL Final   Performed at [facility] [address], [place], [place] [address]   Acetaminophen (Tylenol), Serum 01/04/2021 <10 (L)  10 - 30 ug/mL Final   Comment: (NOTE) Therapeutic concentrations vary significantly. A range of 10-30 ug/mL  may be an effective concentration for many patients. However, some  are best treated at concentrations outside of this range. Acetaminophen concentrations >150 ug/mL at 4 hours after ingestion  and >50 ug/mL at 12 hours after ingestion are often associated with  toxic reactions.  Performed at [facility], [place] [address] [place], [place] [address]    WBC 01/04/2021 4.6  4.0 - 10.5 K/uL Final  RBC 01/04/2021 4.90  4.22 - 5.81 MIL/uL Final   Hemoglobin 01/04/2021 14.0  13.0 - 17.0 g/dL Final   HCT 01/04/2021 42.3  39.0 - 52.0 %  Final   MCV 01/04/2021 86.3  80.0 - 100.0 fL Final   MCH 01/04/2021 28.6  26.0 - 34.0 pg Final   MCHC 01/04/2021 33.1  30.0 - 36.0 g/dL Final   RDW 01/04/2021 14.4  11.5 - 15.5 % Final   Platelets 01/04/2021 204  150 - 400 K/uL Final   nRBC 01/04/2021 0.0  0.0 - 0.2 % Final   Performed at [facility], [place] [address], [place], [place] [address]   Opiates 01/04/2021 NONE DETECTED  NONE DETECTED Final   Cocaine 01/04/2021 POSITIVE (A)  NONE DETECTED Final   Benzodiazepines 01/04/2021 NONE DETECTED  NONE DETECTED Final   Amphetamines 01/04/2021 NONE DETECTED  NONE DETECTED Final   Tetrahydrocannabinol 01/04/2021 POSITIVE (A)  NONE DETECTED Final   Barbiturates 01/04/2021 NONE DETECTED  NONE DETECTED Final   Comment: (NOTE) DRUG SCREEN FOR MEDICAL PURPOSES ONLY.  IF CONFIRMATION IS NEEDED FOR ANY PURPOSE, NOTIFY LAB WITHIN 5 DAYS.  LOWEST DETECTABLE LIMITS FOR URINE DRUG SCREEN Drug Class                     Cutoff (ng/mL) Amphetamine and metabolites    1000 Barbiturate and metabolites    200 Benzodiazepine                 953 Tricyclics and metabolites     300 Opiates and metabolites        300 Cocaine and metabolites        300 THC                            50 Performed at [facility], [place] [address], [place],  [address]    SARS Coronavirus 2 by RT PCR 01/04/2021 NEGATIVE  NEGATIVE Final   Comment: (NOTE) SARS-CoV-2 target nucleic acids are NOT DETECTED.  The SARS-CoV-2 RNA is generally detectable in upper respiratory specimens during the acute phase of infection. The lowest concentration of SARS-CoV-2 viral copies this assay can detect is 138 copies/mL. A negative result does not preclude SARS-Cov-2 infection and should not be used as the sole basis for treatment or other patient management decisions. A negative result may occur with  improper specimen collection/handling, submission of specimen other than nasopharyngeal swab, presence of viral  mutation(s) within the areas targeted by this assay, and inadequate number of viral copies(<138 copies/mL). A negative result must be combined with clinical observations, patient history, and epidemiological information. The expected result is Negative.  Fact Sheet for Patients:  EntrepreneurPulse.com.au  Fact Sheet for Healthcare Providers:  IncredibleEmployment.be  This test is no                          t yet approved or cleared by the [place] FDA and  has been authorized for detection and/or diagnosis of SARS-CoV-2 by FDA under an Emergency Use Authorization (EUA). This EUA will remain  in effect (meaning this test can be used) for the duration of the COVID-19 declaration under Section 564(b)(1) of the Act, 21 U.S.C.section 360bbb-3(b)(1), unless the authorization is terminated  or revoked sooner.       Influenza A by PCR 01/04/2021 NEGATIVE  NEGATIVE Final   Influenza B by PCR  01/04/2021 NEGATIVE  NEGATIVE Final   Comment: (NOTE) The Xpert Xpress SARS-CoV-2/FLU/RSV plus assay is intended as an aid in the diagnosis of influenza from Nasopharyngeal swab specimens and should not be used as a sole basis for treatment. Nasal washings and aspirates are unacceptable for Xpert Xpress SARS-CoV-2/FLU/RSV testing.  Fact Sheet for Patients: EntrepreneurPulse.com.au  Fact Sheet for Healthcare Providers: IncredibleEmployment.be  This test is not yet approved or cleared by the [place] FDA and has been authorized for detection and/or diagnosis of SARS-CoV-2 by FDA under an Emergency Use Authorization (EUA). This EUA will remain in effect (meaning this test can be used) for the duration of the COVID-19 declaration under Section 564(b)(1) of the Act, 21 U.S.C. section 360bbb-3(b)(1), unless the authorization is terminated or revoked.  Performed at [facility], [place] [address] [place],  [place] [address]    Glucose-Capillary 01/04/2021 150 (H)  70 - 99 mg/dL Final   Glucose reference range applies only to samples taken after fasting for at least 8 hours.  Admission on 12/08/2020, Discharged on 12/16/2020  Component Date Value Ref Range Status   Glucose-Capillary 12/09/2020 172 (H)  70 - 99 mg/dL Final   Glucose reference range applies only to samples taken after fasting for at least 8 hours.   TSH 12/10/2020 0.977  0.350 - 4.500 uIU/mL Final   Comment: Performed by a 3rd Generation assay with a functional sensitivity of <=0.01 uIU/mL. Performed at [facility], [place] [address] [place], [place] [address]    Hgb A1c MFr Bld 12/10/2020 6.8 (H)  4.8 - 5.6 % Final   Comment: (NOTE) Pre diabetes:          5.7%-6.4%  Diabetes:              >6.4%  Glycemic control for   <7.0% adults with diabetes    Mean Plasma Glucose 12/10/2020 148.46  mg/dL Final   Performed at [facility], [place] [address] [place],  [address]   Cholesterol 12/10/2020 177  0 - 200 mg/dL Final   Triglycerides 12/10/2020 124  <150 mg/dL Final   HDL 12/10/2020 50  >40 mg/dL Final   Total CHOL/HDL Ratio 12/10/2020 3.5  RATIO Final   VLDL 12/10/2020 25  0 - 40 mg/dL Final   LDL Cholesterol 12/10/2020 102 (H)  0 - 99 mg/dL Final   Comment:        Total Cholesterol/HDL:CHD Risk Coronary Heart Disease Risk Table                     Men   Women  1/2 Average Risk   3.4   3.3  Average Risk       5.0   4.4  2 X Average Risk   9.6   7.1  3 X Average Risk  23.4   11.0        Use the calculated Patient Ratio above and the CHD Risk Table to determine the patient's CHD Risk.        ATP III CLASSIFICATION (LDL):  <100     mg/dL   Optimal  100-129  mg/dL   Near or Above                    Optimal  130-159  mg/dL   Borderline  160-189  mg/dL   High  >190     mg/dL   Very High Performed at [facility]  [address], [place], [place] [address]     Glucose-Capillary 12/09/2020 191 (H)  70 - 99 mg/dL Final   Glucose reference range applies only to samples taken after fasting for at least 8 hours.   Glucose-Capillary 12/10/2020 118 (H)  70 - 99 mg/dL Final   Glucose reference range applies only to samples taken after fasting for at least 8 hours.   Glucose-Capillary 12/10/2020 136 (H)  70 - 99 mg/dL Final   Glucose reference range applies only to samples taken after fasting for at least 8 hours.   Glucose-Capillary 12/10/2020 125 (H)  70 - 99 mg/dL Final   Glucose reference range applies only to samples taken after fasting for at least 8 hours.   Glucose-Capillary 12/11/2020 96  70 - 99 mg/dL Final   Glucose reference range applies only to samples taken after fasting for at least 8 hours.   Glucose-Capillary 12/11/2020 92  70 - 99 mg/dL Final   Glucose reference range applies only to samples taken after fasting for at least 8 hours.   SARS Coronavirus 2 by RT PCR 12/11/2020 NEGATIVE  NEGATIVE Final   Comment: (NOTE) SARS-CoV-2 target nucleic acids are NOT DETECTED.  The SARS-CoV-2 RNA is generally detectable in upper respiratory specimens during the acute phase of infection. The lowest concentration of SARS-CoV-2 viral copies this assay can detect is 138 copies/mL. A negative result does not preclude SARS-Cov-2 infection and should not be used as the sole basis for treatment or other patient management decisions. A negative result may occur with  improper specimen collection/handling, submission of specimen other than nasopharyngeal swab, presence of viral mutation(s) within the areas targeted by this assay, and inadequate number of viral copies(<138 copies/mL). A negative result must be combined with clinical observations, patient history, and epidemiological information. The expected result is Negative.  Fact Sheet for Patients:  BloggerCourse.com  Fact Sheet for Healthcare Providers:   SeriousBroker.it  This test is no                          t yet approved or cleared by the [place] FDA and  has been authorized for detection and/or diagnosis of SARS-CoV-2 by FDA under an Emergency Use Authorization (EUA). This EUA will remain  in effect (meaning this test can be used) for the duration of the COVID-19 declaration under Section 564(b)(1) of the Act, 21 U.S.C.section 360bbb-3(b)(1), unless the authorization is terminated  or revoked sooner.       Influenza A by PCR 12/11/2020 NEGATIVE  NEGATIVE Final   Influenza B by PCR 12/11/2020 NEGATIVE  NEGATIVE Final   Comment: (NOTE) The Xpert Xpress SARS-CoV-2/FLU/RSV plus assay is intended as an aid in the diagnosis of influenza from Nasopharyngeal swab specimens and should not be used as a sole basis for treatment. Nasal washings and aspirates are unacceptable for Xpert Xpress SARS-CoV-2/FLU/RSV testing.  Fact Sheet for Patients: BloggerCourse.com  Fact Sheet for Healthcare Providers: SeriousBroker.it  This test is not yet approved or cleared by the [place] FDA and has been authorized for detection and/or diagnosis of SARS-CoV-2 by FDA under an Emergency Use Authorization (EUA). This EUA will remain in effect (meaning this test can be used) for the duration of the COVID-19 declaration under Section 564(b)(1) of the Act, 21 U.S.C. section 360bbb-3(b)(1), unless the authorization is terminated or revoked.  Performed at [facility], [address], [place], [place] [address]    Glucose-Capillary 12/11/2020 107 (H)  70 - 99 mg/dL Final   Glucose reference range applies only to samples taken after fasting for at least 8 hours.   Glucose-Capillary 12/12/2020 114 (H)  70 - 99 mg/dL Final   Glucose reference range applies only to samples taken after fasting for at least 8 hours.   Comment 1 12/12/2020 Notify RN    Final   Glucose-Capillary 12/12/2020 149 (H)  70 - 99 mg/dL Final   Glucose reference range applies only to samples taken after fasting for at least 8 hours.   Glucose-Capillary 12/12/2020 138 (H)  70 - 99 mg/dL Final   Glucose reference range applies only to samples taken after fasting for at least 8 hours.   Glucose-Capillary 12/13/2020 114 (H)  70 - 99 mg/dL Final   Glucose reference range applies only to samples taken after fasting for at least 8 hours.   Glucose-Capillary 12/13/2020 107 (H)  70 - 99 mg/dL Final   Glucose reference range applies only to samples taken after fasting for at least 8 hours.   Glucose-Capillary 12/14/2020 139 (H)  70 - 99 mg/dL Final   Glucose reference range applies only to samples taken after fasting for at least 8 hours.   Glucose-Capillary 12/14/2020 105 (H)  70 - 99 mg/dL Final   Glucose reference range applies only to samples taken after fasting for at least 8 hours.   Glucose-Capillary 12/15/2020 121 (H)  70 - 99 mg/dL Final   Glucose reference range applies only to samples taken after fasting for at least 8 hours.   Glucose-Capillary 12/16/2020 182 (H)  70 - 99 mg/dL Final   Glucose reference range applies only to samples taken after fasting for at least 8 hours.   Comment 1 12/16/2020 Notify RN   Final   Glucose-Capillary 12/15/2020 172 (H)  70 - 99 mg/dL Final   Glucose reference range applies only to samples taken after fasting for at least 8 hours.   Comment 1 12/15/2020 Notify RN   Final  Admission on 12/05/2020, Discharged on 12/08/2020  Component Date Value Ref Range Status   SARS Coronavirus 2 by RT PCR 12/05/2020 NEGATIVE  NEGATIVE Final   Comment: (NOTE) SARS-CoV-2 target nucleic acids are NOT DETECTED.  The SARS-CoV-2 RNA is generally detectable in upper respiratory specimens during the acute phase of infection. The lowest concentration of SARS-CoV-2 viral copies this assay can detect is 138 copies/mL. A negative result does not  preclude SARS-Cov-2 infection and should not be used as the sole basis for treatment or other patient management decisions. A negative result may occur with  improper specimen collection/handling, submission of specimen other than nasopharyngeal swab, presence of viral mutation(s) within the areas targeted by this assay, and inadequate number of viral copies(<138 copies/mL). A negative result must be combined with clinical observations, patient history, and epidemiological information. The expected result is Negative.  Fact Sheet for Patients:  EntrepreneurPulse.com.au  Fact Sheet for Healthcare Providers:  IncredibleEmployment.be  This test is no                          t yet approved or cleared by the [place] FDA and  has been authorized for detection and/or diagnosis of SARS-CoV-2 by FDA under an Emergency Use Authorization (EUA). This EUA will remain  in effect (meaning this test can be used) for the duration of the COVID-19 declaration under Section 564(b)(1) of the Act, 21 U.S.C.section 360bbb-3(b)(1), unless the authorization is terminated  or revoked sooner.       Influenza A by PCR 12/05/2020 NEGATIVE  NEGATIVE Final   Influenza B by PCR 12/05/2020 NEGATIVE  NEGATIVE Final   Comment: (NOTE) The Xpert Xpress SARS-CoV-2/FLU/RSV plus assay is intended as an aid in the diagnosis of influenza from Nasopharyngeal swab specimens and should not be used as a sole basis for treatment. Nasal washings and aspirates are unacceptable for Xpert Xpress SARS-CoV-2/FLU/RSV testing.  Fact Sheet for Patients: BloggerCourse.com  Fact Sheet for Healthcare Providers: SeriousBroker.it  This test is not yet approved or cleared by the [place] FDA and has been authorized for detection and/or diagnosis of SARS-CoV-2 by FDA under an Emergency Use Authorization (EUA). This EUA will remain in  effect (meaning this test can be used) for the duration of the COVID-19 declaration under Section 564(b)(1) of the Act, 21 U.S.C. section 360bbb-3(b)(1), unless the authorization is terminated or revoked.  Performed at [facility], [address] [place], [place] [address]    Sodium 12/05/2020 135  135 - 145 mmol/L Final   Potassium 12/05/2020 3.9  3.5 - 5.1 mmol/L Final   Chloride 12/05/2020 105  98 - 111 mmol/L Final   CO2 12/05/2020 24  22 - 32 mmol/L Final   Glucose, Bld 12/05/2020 222 (H)  70 - 99 mg/dL Final   Glucose reference range applies only to samples taken after fasting for at least 8 hours.   BUN 12/05/2020 11  8 - 23 mg/dL Final   Creatinine, Ser 12/05/2020 1.01  0.61 - 1.24 mg/dL Final   Calcium 69/67/8938 9.0  8.9 - 10.3 mg/dL Final   Total Protein 12/01/5100 6.9  6.5 - 8.1 g/dL Final   Albumin 58/52/7782 3.7  3.5 - 5.0 g/dL Final   AST 42/35/3614 18  15 - 41 U/L Final   ALT 12/05/2020 17  0 - 44 U/L Final   Alkaline Phosphatase 12/05/2020 50  38 - 126 U/L Final   Total Bilirubin 12/05/2020 0.7  0.3 - 1.2 mg/dL Final   GFR, Estimated 12/05/2020 >60  >60 mL/min Final   Comment: (NOTE) Calculated using the CKD-EPI Creatinine Equation (2021)    Anion gap 12/05/2020 6  5 - 15 Final   Performed at [facility], [address] [place], [place] [address]   Alcohol, Ethyl (B) 12/05/2020 <10  <10 mg/dL Final   Comment: (NOTE) Lowest detectable limit for serum alcohol is 10 mg/dL.  For medical purposes only. Performed at [facility], [address] [place], [place] [address]    Opiates 12/05/2020 NONE DETECTED  NONE DETECTED Final   Cocaine 12/05/2020 POSITIVE (A)  NONE DETECTED Final   Benzodiazepines 12/05/2020 NONE DETECTED  NONE DETECTED Final   Amphetamines 12/05/2020 NONE DETECTED  NONE DETECTED Final   Tetrahydrocannabinol 12/05/2020 NONE DETECTED  NONE DETECTED Final   Barbiturates 12/05/2020 NONE  DETECTED  NONE DETECTED Final   Comment: (NOTE) DRUG SCREEN FOR MEDICAL PURPOSES ONLY.  IF CONFIRMATION IS NEEDED FOR ANY PURPOSE, NOTIFY LAB WITHIN 5 DAYS.  LOWEST DETECTABLE LIMITS FOR URINE DRUG SCREEN Drug Class                     Cutoff (ng/mL) Amphetamine and metabolites    1000 Barbiturate and metabolites    200 Benzodiazepine                 200 Tricyclics and metabolites     300 Opiates and metabolites  300 Cocaine and metabolites        300 THC                            50 Performed at [facility], [place] [address] [place], [place] [address]    WBC 12/05/2020 4.6  4.0 - 10.5 K/uL Final   RBC 12/05/2020 5.17  4.22 - 5.81 MIL/uL Final   Hemoglobin 12/05/2020 14.8  13.0 - 17.0 g/dL Final   HCT 12/05/2020 44.3  39.0 - 52.0 % Final   MCV 12/05/2020 85.7  80.0 - 100.0 fL Final   MCH 12/05/2020 28.6  26.0 - 34.0 pg Final   MCHC 12/05/2020 33.4  30.0 - 36.0 g/dL Final   RDW 12/05/2020 13.9  11.5 - 15.5 % Final   Platelets 12/05/2020 212  150 - 400 K/uL Final   nRBC 12/05/2020 0.0  0.0 - 0.2 % Final   Neutrophils Relative % 12/05/2020 71  % Final   Neutro Abs 12/05/2020 3.3  1.7 - 7.7 K/uL Final   Lymphocytes Relative 12/05/2020 21  % Final   Lymphs Abs 12/05/2020 1.0  0.7 - 4.0 K/uL Final   Monocytes Relative 12/05/2020 7  % Final   Monocytes Absolute 12/05/2020 0.3  0.1 - 1.0 K/uL Final   Eosinophils Relative 12/05/2020 1  % Final   Eosinophils Absolute 12/05/2020 0.1  0.0 - 0.5 K/uL Final   Basophils Relative 12/05/2020 0  % Final   Basophils Absolute 12/05/2020 0.0  0.0 - 0.1 K/uL Final   Immature Granulocytes 12/05/2020 0  % Final   Abs Immature Granulocytes 12/05/2020 0.01  0.00 - 0.07 K/uL Final   Performed at [facility], [place] [address], [place], [place] [address]   Acetaminophen (Tylenol), Serum 12/05/2020 <10 (L)  10 - 30 ug/mL Final   Comment: (NOTE) Therapeutic concentrations vary significantly. A range of 10-30  ug/mL  may be an effective concentration for many patients. However, some  are best treated at concentrations outside of this range. Acetaminophen concentrations >150 ug/mL at 4 hours after ingestion  and >50 ug/mL at 12 hours after ingestion are often associated with  toxic reactions.  Performed at [facility], [place] [address] [place], [place] [address]    Salicylate Lvl 28/41/3244 <7.0 (L)  7.0 - 30.0 mg/dL Final   Performed at [facility] [address], [place], [place] [address]   Glucose-Capillary 12/05/2020 191 (H)  70 - 99 mg/dL Final   Glucose reference range applies only to samples taken after fasting for at least 8 hours.   Glucose-Capillary 12/08/2020 107 (H)  70 - 99 mg/dL Final   Glucose reference range applies only to samples taken after fasting for at least 8 hours.   SARS Coronavirus 2 by RT PCR 12/08/2020 NEGATIVE  NEGATIVE Final   Comment: (NOTE) SARS-CoV-2 target nucleic acids are NOT DETECTED.  The SARS-CoV-2 RNA is generally detectable in upper respiratory specimens during the acute phase of infection. The lowest concentration of SARS-CoV-2 viral copies this assay can detect is 138 copies/mL. A negative result does not preclude SARS-Cov-2 infection and should not be used as the sole basis for treatment or other patient management decisions. A negative result may occur with  improper specimen collection/handling, submission of specimen other than nasopharyngeal swab, presence of viral mutation(s) within the areas targeted by this assay, and inadequate number of viral copies(<138 copies/mL). A negative result  must be combined with clinical observations, patient history, and epidemiological information. The expected result is Negative.  Fact Sheet for Patients:  EntrepreneurPulse.com.au  Fact Sheet for Healthcare Providers:  IncredibleEmployment.be  This test is no                           t yet approved or cleared by the [place] FDA and  has been authorized for detection and/or diagnosis of SARS-CoV-2 by FDA under an Emergency Use Authorization (EUA). This EUA will remain  in effect (meaning this test can be used) for the duration of the COVID-19 declaration under Section 564(b)(1) of the Act, 21 U.S.C.section 360bbb-3(b)(1), unless the authorization is terminated  or revoked sooner.       Influenza A by PCR 12/08/2020 NEGATIVE  NEGATIVE Final   Influenza B by PCR 12/08/2020 NEGATIVE  NEGATIVE Final   Comment: (NOTE) The Xpert Xpress SARS-CoV-2/FLU/RSV plus assay is intended as an aid in the diagnosis of influenza from Nasopharyngeal swab specimens and should not be used as a sole basis for treatment. Nasal washings and aspirates are unacceptable for Xpert Xpress SARS-CoV-2/FLU/RSV testing.  Fact Sheet for Patients: EntrepreneurPulse.com.au  Fact Sheet for Healthcare Providers: IncredibleEmployment.be  This test is not yet approved or cleared by the [place] FDA and has been authorized for detection and/or diagnosis of SARS-CoV-2 by FDA under an Emergency Use Authorization (EUA). This EUA will remain in effect (meaning this test can be used) for the duration of the COVID-19 declaration under Section 564(b)(1) of the Act, 21 U.S.C. section 360bbb-3(b)(1), unless the authorization is terminated or revoked.  Performed at [facility], [place] [address], [place], [place] [address]   Admission on 10/09/2020, Discharged on 10/11/2020  Component Date Value Ref Range Status   Glucose-Capillary 10/09/2020 153 (H)  70 - 99 mg/dL Final   Glucose reference range applies only to samples taken after fasting for at least 8 hours.   pH, Ven 10/09/2020 7.381  7.250 - 7.430 Final   pCO2, Ven 10/09/2020 42.8 (L)  44.0 - 60.0 mmHg Final   pO2, Ven 10/09/2020 37.0  32.0 - 45.0 mmHg Final   Bicarbonate 10/09/2020  25.4  20.0 - 28.0 mmol/L Final   TCO2 10/09/2020 27  22 - 32 mmol/L Final   O2 Saturation 10/09/2020 70.0  % Final   Acid-Base Excess 10/09/2020 0.0  0.0 - 2.0 mmol/L Final   Sodium 10/09/2020 143  135 - 145 mmol/L Final   Potassium 10/09/2020 3.8  3.5 - 5.1 mmol/L Final   Calcium, Ion 10/09/2020 1.26  1.15 - 1.40 mmol/L Final   HCT 10/09/2020 41.0  39.0 - 52.0 % Final   Hemoglobin 10/09/2020 13.9  13.0 - 17.0 g/dL Final   Sample type 10/09/2020 MIXED VENOUS SAMPLE   Final   Glucose-Capillary 10/09/2020 127 (H)  70 - 99 mg/dL Final   Glucose reference range applies only to samples taken after fasting for at least 8 hours.   SARS Coronavirus 2 10/09/2020 NEGATIVE  NEGATIVE Final   Comment: (NOTE) SARS-CoV-2 target nucleic acids are NOT DETECTED.  The SARS-CoV-2 RNA is generally detectable in upper and lower respiratory specimens during the acute phase of infection. Negative results do not preclude SARS-CoV-2 infection, do not rule out co-infections with other pathogens, and should not be used as the sole basis for treatment or other patient management decisions. Negative results must be combined with clinical observations, patient history, and epidemiological information.  The expected result is Negative.  Fact Sheet for Patients: SugarRoll.be  Fact Sheet for Healthcare Providers: https://www.woods-mathews.com/  This test is not yet approved or cleared by the [place] FDA and  has been authorized for detection and/or diagnosis of SARS-CoV-2 by FDA under an Emergency Use Authorization (EUA). This EUA will remain  in effect (meaning this test can be used) for the duration of the COVID-19 declaration under Se                          ction 564(b)(1) of the Act, 21 U.S.C. section 360bbb-3(b)(1), unless the authorization is terminated or revoked sooner.  Performed at [facility], [place] [address], [place], [place] [address]     Weight 10/09/2020 3,120  oz Final   Height 10/09/2020 70  in Final   BP 10/09/2020 136/80  mmHg Final   Single Plane A2C EF 10/09/2020 48.1  % Final   Single Plane A4C EF 10/09/2020 35.3  % Final   Calc EF 10/09/2020 44.3  % Final   S' Lateral 10/09/2020 4.50  cm Final   AR max vel 10/09/2020 1.78  cm2 Final   AV Area VTI 10/09/2020 1.71  cm2 Final   AV Mean grad 10/09/2020 3.0  mmHg Final   AV Peak grad 10/09/2020 5.7  mmHg Final   Ao pk vel 10/09/2020 1.19  m/s Final   Area-P 1/2 10/09/2020 4.80  cm2 Final   AV Area mean vel 10/09/2020 1.69  cm2 Final   Glucose-Capillary 10/09/2020 96  70 - 99 mg/dL Final   Glucose reference range applies only to samples taken after fasting for at least 8 hours.   Sodium 10/10/2020 136  135 - 145 mmol/L Final   Potassium 10/10/2020 4.1  3.5 - 5.1 mmol/L Final   Chloride 10/10/2020 105  98 - 111 mmol/L Final   CO2 10/10/2020 24  22 - 32 mmol/L Final   Glucose, Bld 10/10/2020 132 (H)  70 - 99 mg/dL Final   Glucose reference range applies only to samples taken after fasting for at least 8 hours.   BUN 10/10/2020 13  8 - 23 mg/dL Final   Creatinine, Ser 10/10/2020 0.93  0.61 - 1.24 mg/dL Final   Calcium 10/10/2020 9.2  8.9 - 10.3 mg/dL Final   GFR, Estimated 10/10/2020 >60  >60 mL/min Final   Comment: (NOTE) Calculated using the CKD-EPI Creatinine Equation (2021)    Anion gap 10/10/2020 7  5 - 15 Final   Performed at [facility] [address] [place], [place] [address]   WBC 10/10/2020 5.2  4.0 - 10.5 K/uL Final   RBC 10/10/2020 5.10  4.22 - 5.81 MIL/uL Final   Hemoglobin 10/10/2020 14.6  13.0 - 17.0 g/dL Final   HCT 10/10/2020 43.0  39.0 - 52.0 % Final   MCV 10/10/2020 84.3  80.0 - 100.0 fL Final   MCH 10/10/2020 28.6  26.0 - 34.0 pg Final   MCHC 10/10/2020 34.0  30.0 - 36.0 g/dL Final   RDW 10/10/2020 14.3  11.5 - 15.5 % Final   Platelets 10/10/2020 173  150 - 400 K/uL Final   nRBC 10/10/2020 0.0  0.0 - 0.2 % Final   Performed at  [facility] [address] [place], [place] [address]   Hgb A1c MFr Bld 10/10/2020 8.2 (H)  4.8 - 5.6 % Final   Comment: (NOTE) Pre diabetes:  5.7%-6.4%  Diabetes:              >6.4%  Glycemic control for   <7.0% adults with diabetes    Mean Plasma Glucose 10/10/2020 188.64  mg/dL Final   Performed at [facility] [address], [place], [place] [address]   Cholesterol 10/10/2020 135  0 - 200 mg/dL Final   Triglycerides 10/10/2020 95  <150 mg/dL Final   HDL 10/10/2020 34 (L)  >40 mg/dL Final   Total CHOL/HDL Ratio 10/10/2020 4.0  RATIO Final   VLDL 10/10/2020 19  0 - 40 mg/dL Final   LDL Cholesterol 10/10/2020 82  0 - 99 mg/dL Final   Comment:        Total Cholesterol/HDL:CHD Risk Coronary Heart Disease Risk Table                     Men   Women  1/2 Average Risk   3.4   3.3  Average Risk       5.0   4.4  2 X Average Risk   9.6   7.1  3 X Average Risk  23.4   11.0        Use the calculated Patient Ratio above and the CHD Risk Table to determine the patient's CHD Risk.        ATP III CLASSIFICATION (LDL):  <100     mg/dL   Optimal  100-129  mg/dL   Near or Above                    Optimal  130-159  mg/dL   Borderline  160-189  mg/dL   High  >190     mg/dL   Very High Performed at [facility] [address], [place], [place] [address]    Glucose-Capillary 10/09/2020 179 (H)  70 - 99 mg/dL Final   Glucose reference range applies only to samples taken after fasting for at least 8 hours.   pH, Arterial 10/09/2020 7.428  7.350 - 7.450 Final   pCO2 arterial 10/09/2020 35.2  32.0 - 48.0 mmHg Final   pO2, Arterial 10/09/2020 100  83.0 - 108.0 mmHg Final   Bicarbonate 10/09/2020 23.2  20.0 - 28.0 mmol/L Final   TCO2 10/09/2020 24  22 - 32 mmol/L Final   O2 Saturation 10/09/2020 98.0  % Final   Acid-base deficit 10/09/2020 1.0  0.0 - 2.0 mmol/L Final   Sodium 10/09/2020 142  135 - 145 mmol/L Final   Potassium 10/09/2020 3.8  3.5 - 5.1 mmol/L  Final   Calcium, Ion 10/09/2020 1.23  1.15 - 1.40 mmol/L Final   HCT 10/09/2020 41.0  39.0 - 52.0 % Final   Hemoglobin 10/09/2020 13.9  13.0 - 17.0 g/dL Final   Sample type 10/09/2020 ARTERIAL   Final   Glucose-Capillary 10/10/2020 140 (H)  70 - 99 mg/dL Final   Glucose reference range applies only to samples taken after fasting for at least 8 hours.   Total Protein 10/10/2020 6.0 (L)  6.5 - 8.1 g/dL Final   Albumin 10/10/2020 3.3 (L)  3.5 - 5.0 g/dL Final   AST 10/10/2020 21  15 - 41 U/L Final   ALT 10/10/2020 19  0 - 44 U/L Final   Alkaline Phosphatase 10/10/2020 52  38 - 126 U/L Final   Total Bilirubin 10/10/2020 0.8  0.3 - 1.2 mg/dL Final   Bilirubin, Direct 10/10/2020 0.1  0.0 - 0.2 mg/dL Final  Indirect Bilirubin 10/10/2020 0.7  0.3 - 0.9 mg/dL Final   Performed at [facility] [address], [place], [place] [address]   MRSA, PCR 10/10/2020 NEGATIVE  NEGATIVE Final   Staphylococcus aureus 10/10/2020 NEGATIVE  NEGATIVE Final   Comment: (NOTE) The Xpert SA Assay (FDA approved for NASAL specimens in patients 24 years of age and older), is one component of a comprehensive surveillance program. It is not intended to diagnose infection nor to guide or monitor treatment. Performed at [facility], [place] [address] [place], [place] [address]    Glucose-Capillary 10/10/2020 75  70 - 99 mg/dL Final   Glucose reference range applies only to samples taken after fasting for at least 8 hours.   Glucose-Capillary 10/10/2020 78  70 - 99 mg/dL Final   Glucose reference range applies only to samples taken after fasting for at least 8 hours.   Sodium 10/11/2020 136  135 - 145 mmol/L Final   Potassium 10/11/2020 4.0  3.5 - 5.1 mmol/L Final   Chloride 10/11/2020 105  98 - 111 mmol/L Final   CO2 10/11/2020 21 (L)  22 - 32 mmol/L Final   Glucose, Bld 10/11/2020 224 (H)  70 - 99 mg/dL Final   Glucose reference range applies only to samples taken after fasting for at least 8 hours.    BUN 10/11/2020 14  8 - 23 mg/dL Final   Creatinine, Ser 10/11/2020 1.06  0.61 - 1.24 mg/dL Final   Calcium 10/11/2020 9.2  8.9 - 10.3 mg/dL Final   GFR, Estimated 10/11/2020 >60  >60 mL/min Final   Comment: (NOTE) Calculated using the CKD-EPI Creatinine Equation (2021)    Anion gap 10/11/2020 10  5 - 15 Final   Performed at [facility], [place] [address] [place], [place] [address]   WBC 10/11/2020 4.9  4.0 - 10.5 K/uL Final   RBC 10/11/2020 4.96  4.22 - 5.81 MIL/uL Final   Hemoglobin 10/11/2020 14.0  13.0 - 17.0 g/dL Final   HCT 10/11/2020 41.9  39.0 - 52.0 % Final   MCV 10/11/2020 84.5  80.0 - 100.0 fL Final   MCH 10/11/2020 28.2  26.0 - 34.0 pg Final   MCHC 10/11/2020 33.4  30.0 - 36.0 g/dL Final   RDW 10/11/2020 14.3  11.5 - 15.5 % Final   Platelets 10/11/2020 172  150 - 400 K/uL Final   nRBC 10/11/2020 0.0  0.0 - 0.2 % Final   Performed at [facility] [address] [place], [place] [address]   Glucose-Capillary 10/10/2020 79  70 - 99 mg/dL Final   Glucose reference range applies only to samples taken after fasting for at least 8 hours.   Activated Clotting Time 10/10/2020 202  seconds Final   Activated Clotting Time 10/10/2020 457  seconds Final   Glucose-Capillary 10/10/2020 250 (H)  70 - 99 mg/dL Final   Glucose reference range applies only to samples taken after fasting for at least 8 hours.   Glucose-Capillary 10/11/2020 127 (H)  70 - 99 mg/dL Final   Glucose reference range applies only to samples taken after fasting for at least 8 hours.   Glucose-Capillary 10/10/2020 76  70 - 99 mg/dL Final   Glucose reference range applies only to samples taken after fasting for at least 8 hours.  Office Visit on 10/07/2020  Component Date Value Ref Range Status   Glucose 10/07/2020 193 (H)  65 - 99 mg/dL Final   BUN 10/07/2020  11  8 - 27 mg/dL Final   Creatinine, Ser 10/07/2020 0.95  0.76 - 1.27 mg/dL Final   eGFR 10/07/2020 90  >59 mL/min/1.73 Final   BUN/Creatinine  Ratio 10/07/2020 12  10 - 24 Final   Sodium 10/07/2020 139  134 - 144 mmol/L Final   Potassium 10/07/2020 4.1  3.5 - 5.2 mmol/L Final   Chloride 10/07/2020 102  96 - 106 mmol/L Final   CO2 10/07/2020 24  20 - 29 mmol/L Final   Calcium 10/07/2020 9.7  8.6 - 10.2 mg/dL Final   WBC 10/07/2020 5.0  3.4 - 10.8 x10E3/uL Final   RBC 10/07/2020 5.18  4.14 - 5.80 x10E6/uL Final   Hemoglobin 10/07/2020 14.5  13.0 - 17.7 g/dL Final   Hematocrit 10/07/2020 44.3  37.5 - 51.0 % Final   MCV 10/07/2020 86  79 - 97 fL Final   MCH 10/07/2020 28.0  26.6 - 33.0 pg Final   MCHC 10/07/2020 32.7  31.5 - 35.7 g/dL Final   RDW 10/07/2020 14.6  11.6 - 15.4 % Final   Platelets 10/07/2020 194  150 - 450 x10E3/uL Final    Allergies: Chlorpromazine  PTA Medications: (Not in a hospital admission)   Medical Decision Making  Patient continued to express suicidal ideation; he will be admitted to [facility] for continuous assessment with follow-up by psychiatry on 03/07/21 during day shift. -Continue home medications -CIWA protocol -Reviewed labs -Monitor for safety    Recommendations  Based on my evaluation the patient does not appear to have an emergency medical condition.  Ophelia Shoulder, NP 03/06/21  10:28 PM

## 2021-03-06 NOTE — BH Assessment (Addendum)
@  1642, Clinician requested Myriam Jacobson, RN, to place the TTS machine inpatient's room for his initial TTS assessment. Nursing responded back immediately stating she would set the TTS machine up and notify me when patient is ready to be seen.

## 2021-03-07 DIAGNOSIS — F314 Bipolar disorder, current episode depressed, severe, without psychotic features: Secondary | ICD-10-CM

## 2021-03-07 MED ORDER — HYDROXYZINE HCL 25 MG PO TABS: 25.0000 mg | ORAL_TABLET | Freq: Three times a day (TID) | ORAL | Status: DC | PRN

## 2021-03-07 MED ORDER — GABAPENTIN 100 MG PO CAPS
100.0000 mg | ORAL_CAPSULE | Freq: Three times a day (TID) | ORAL | Status: DC
  Administered 2021-03-07 – 2021-03-09 (×7): 100 mg via ORAL
  Filled 2021-03-07 (×7): qty 1

## 2021-03-07 MED ORDER — METOPROLOL SUCCINATE ER 25 MG PO TB24
25.0000 mg | ORAL_TABLET | Freq: Every day | ORAL | Status: DC
  Administered 2021-03-07 – 2021-03-09 (×3): 25 mg via ORAL
  Filled 2021-03-07 (×3): qty 1

## 2021-03-07 MED ORDER — DULOXETINE HCL 30 MG PO CPEP
30.0000 mg | ORAL_CAPSULE | Freq: Every day | ORAL | Status: DC
  Administered 2021-03-07 – 2021-03-09 (×3): 30 mg via ORAL
  Filled 2021-03-07 (×3): qty 1

## 2021-03-07 MED ORDER — METFORMIN HCL ER 500 MG PO TB24
500.0000 mg | ORAL_TABLET | Freq: Every day | ORAL | Status: DC
  Administered 2021-03-08 – 2021-03-09 (×2): 500 mg via ORAL
  Filled 2021-03-07 (×2): qty 1

## 2021-03-07 MED ORDER — ARIPIPRAZOLE 5 MG PO TABS
5.0000 mg | ORAL_TABLET | Freq: Every day | ORAL | Status: DC
  Administered 2021-03-07 – 2021-03-09 (×3): 5 mg via ORAL
  Filled 2021-03-07 (×3): qty 1

## 2021-03-07 NOTE — ED Notes (Signed)
Pt resting on pull out bed in observation area. Snacks provided during the day per request of Pt. No distress observed. Safety maintained and will continue to monitor.

## 2021-03-07 NOTE — ED Provider Notes (Signed)
Behavioral Health Progress Note  Date and Time: 03/07/2021 8:55 AM Name: [name] MRN:  [id number]  Subjective:   [name], 66 y.o., male patient who initially presented to [facility] on 03/06/2021 and was transferred to [facility] for overnight continuous assessment and to be evaluated by psychiatry in the am.  Patient seen face to face by this provider, and consulted with Dr. Lovette Cliche ; and  chart reviewed on 03/07/21.   Per chart review patient has a history of Bipolar effective disorder, depression, poly substance abuse (cocaine and alcohol), PTSD and SI.  He can not recall the medications that he is prescribe other an "my psych and heart meds". Per chart review he is prescribed Plavix 75 mg daily, Abilify 10 mg daily, Cymbalta 30 mg daily, gabapentin 300 mg 3 times daily, Vistaril 25 mg 3 times daily as needed, metformin 24-hour tablet 500 mg daily, metoprolol succinate 25 mg 24-hour tablet daily.  He reports being homeless.  Patient has a history of inpatient psychiatric admissions his last admission was with [facility] 01/2021 and [facility] 12/2020.  During evaluation [name] is laying in his bed awake. He is alert/oriented x 4. He is  calm/cooperative. He makes good eye contact. Speech is clear, coherent, normal rate and tone.He endorses depression with feelings of helplessness, worthlessness, fatigue, and decreased focus.  He endorses decreased sleep and appetite.  He endorses SI with a plan to shoot himself.  Reports he has a gun that is buried in the woods that only he has access to.  He cannot contract for safety.  He endorses homicidal ideations towards "some people".  He does not elaborate.  He endorses auditory hallucinations of voices that say "go kill yourself".  He denies visual hallucinations.  He does not appear to be responding to internal/external stimuli.  He endorses some paranoia towards people.  He Denies delusional thought content.  He endorses  alcohol and cocaine and use.  His last use was over 48 hours ago.  Discussed inpatient psychiatric admission with patient.  Patient agrees.  Explained the inpatient process and that our social worker would contact the [place].  Patient states he would prefer not to be admitted to the [place].  He states, "I do not like their services".  Diagnosis:  Final diagnoses:  Bipolar affective disorder, depressed, severe ([place])  Suicidal ideation  Cocaine substance abuse ([place])  Alcohol abuse    Total Time spent with patient: 30 minutes  Past Psychiatric History: see h&p Past Medical History:  Past Medical History:  Diagnosis Date   Anxiety    Arthritis    Cancer ([place])    Cataract    Depression    Diabetes mellitus    GERD (gastroesophageal reflux disease)    H/O suicide attempt 1997 and 2011   jumped out of moving vehicle and attempted to jump into moving traffic   High cholesterol    Hypertension    Neuropathic pain    Osteoporosis     Past Surgical History:  Procedure Laterality Date   COLONOSCOPY     CORONARY STENT INTERVENTION N/A 10/10/2020   Procedure: CORONARY STENT INTERVENTION;  Surgeon: Jettie Booze, MD;  Location: [place] [facility];  Service: Cardiovascular;  Laterality: N/A;   LEFT HEART CATH AND CORONARY ANGIOGRAPHY N/A 10/10/2020   Procedure: LEFT HEART CATH AND CORONARY ANGIOGRAPHY;  Surgeon: Jettie Booze, MD;  Location: [place] [facility];  Service: Cardiovascular;  Laterality: N/A;   NEPHRECTOMY  due to cancer   RIGHT/LEFT HEART CATH AND CORONARY ANGIOGRAPHY N/A 10/09/2020   Procedure: RIGHT/LEFT HEART CATH AND CORONARY ANGIOGRAPHY;  Surgeon: Nelva Bush, MD;  Location: [place] [facility];  Service: Cardiovascular;  Laterality: N/A;   Family History:  Family History  Problem Relation Age of Onset   Alcohol abuse Sister    Alcohol abuse Brother    Colon cancer Neg Hx    Esophageal cancer Neg Hx    Pancreatic cancer Neg Hx    Rectal cancer Neg Hx     Stomach cancer Neg Hx    Family Psychiatric  History: See h&p Social History:  Social History   Substance and Sexual Activity  Alcohol Use Yes   Alcohol/week: 3.0 standard drinks   Types: 3 Cans of beer per week   Comment: Pt has been clean for 8 months     Social History   Substance and Sexual Activity  Drug Use Yes   Types: "Crack" cocaine, Cocaine    Social History   Socioeconomic History   Marital status: Single    Spouse name: Not on file   Number of children: Not on file   Years of education: Not on file   Highest education level: Not on file  Occupational History   Not on file  Tobacco Use   Smoking status: Every Day    Packs/day: 0.50    Years: 20.00    Pack years: 10.00    Types: Cigarettes   Smokeless tobacco: Never  Vaping Use   Vaping Use: Never used  Substance and Sexual Activity   Alcohol use: Yes    Alcohol/week: 3.0 standard drinks    Types: 3 Cans of beer per week    Comment: Pt has been clean for 8 months   Drug use: Yes    Types: "Crack" cocaine, Cocaine   Sexual activity: Yes    Birth control/protection: Condom  Other Topics Concern   Not on file  Social History Narrative   Lives alone.  Has sister in area.   Social Determinants of Health   Financial Resource Strain: Not on file  Food Insecurity: Not on file  Transportation Needs: Not on file  Physical Activity: Not on file  Stress: Not on file  Social Connections: Not on file   SDOH:  SDOH Screenings   Alcohol Screen: Medium Risk   Last Alcohol Screening Score (AUDIT): 13  Depression (PHQ2-9): Medium Risk   PHQ-2 Score: 12  Financial Resource Strain: Not on file  Food Insecurity: Not on file  Housing: Not on file  Physical Activity: Not on file  Social Connections: Not on file  Stress: Not on file  Tobacco Use: High Risk   Smoking Tobacco Use: Every Day   Smokeless Tobacco Use: Never   Passive Exposure: Not on file  Transportation Needs: Not on file   Additional  Social History:         Sleep: Fair  Appetite:  Fair  Current Medications:  Current Facility-Administered Medications  Medication Dose Route Frequency Provider Last Rate Last Admin   acetaminophen (TYLENOL) tablet 650 mg  650 mg Oral Q6H PRN Ajibola, Ene A, NP       alum & mag hydroxide-simeth (MAALOX/MYLANTA) 200-200-20 MG/5ML suspension 30 mL  30 mL Oral Q4H PRN Ajibola, Ene A, NP       magnesium hydroxide (MILK OF MAGNESIA) suspension 30 mL  30 mL Oral Daily PRN Ajibola, Ene A, NP  traZODone (DESYREL) tablet 50 mg  50 mg Oral QHS PRN Ajibola, Ene A, NP       Current Outpatient Medications  Medication Sig Dispense Refill   albuterol (VENTOLIN HFA) 108 (90 Base) MCG/ACT inhaler Inhale 1-2 puffs into the lungs every 6 (six) hours as needed for wheezing or shortness of breath. 1 each 0   ARIPiprazole (ABILIFY) 10 MG tablet Take 1 tablet (10 mg total) by mouth daily. Bipolar disorder 30 tablet 0   aspirin EC 81 MG EC tablet Take 1 tablet (81 mg total) by mouth daily. Blood vessel disease 30 tablet 0   clopidogrel (PLAVIX) 75 MG tablet Take 1 tablet (75 mg total) by mouth daily. Prevention of blood clots 30 tablet 0   DULoxetine (CYMBALTA) 30 MG capsule Take 1 capsule (30 mg total) by mouth daily. Depression 30 capsule 0   gabapentin (NEURONTIN) 300 MG capsule Take 1 capsule (300 mg total) by mouth 3 (three) times daily. Neuropathy 90 capsule 0   hydrOXYzine (ATARAX/VISTARIL) 25 MG tablet Take 1 tablet (25 mg total) by mouth 3 (three) times daily as needed for anxiety. anxiety (Patient taking differently: Take 25 mg by mouth 3 (three) times daily as needed for anxiety.) 30 tablet 0   metFORMIN (FORTAMET) 1000 MG (OSM) 24 hr tablet Take 1 tablet (1,000 mg total) by mouth daily with supper. Diabetes 30 tablet 0   metFORMIN (GLUCOPHAGE-XR) 500 MG 24 hr tablet Take 1 tablet (500 mg total) by mouth daily with breakfast. diabetes (Patient not taking: Reported on 01/25/2021) 30 tablet 0    metoprolol succinate (TOPROL-XL) 25 MG 24 hr tablet Take 1 tablet (25 mg total) by mouth daily. Elevated blood pressure 30 tablet 0   Multiple Vitamin (MULTIVITAMIN WITH MINERALS) TABS tablet Take 1 tablet by mouth daily as needed (for supplementation).     nicotine polacrilex (NICORETTE) 2 MG gum Take 1 each (2 mg total) by mouth as needed for smoking cessation. 100 tablet 0   pantoprazole (PROTONIX) 40 MG tablet Take 1 tablet (40 mg total) by mouth daily. Acid reflux disease 30 tablet 0   rosuvastatin (CRESTOR) 40 MG tablet Take 1 tablet (40 mg total) by mouth daily. Elevated cholesterol 30 tablet 0   traZODone (DESYREL) 50 MG tablet Take 50 mg by mouth at bedtime as needed for sleep.      Labs  Lab Results:  Admission on 03/06/2021, Discharged on 03/06/2021  Component Date Value Ref Range Status   WBC 03/06/2021 5.2  4.0 - 10.5 K/uL Final   RBC 03/06/2021 4.77  4.22 - 5.81 MIL/uL Final   Hemoglobin 03/06/2021 13.8  13.0 - 17.0 g/dL Final   HCT 03/06/2021 41.7  39.0 - 52.0 % Final   MCV 03/06/2021 87.4  80.0 - 100.0 fL Final   MCH 03/06/2021 28.9  26.0 - 34.0 pg Final   MCHC 03/06/2021 33.1  30.0 - 36.0 g/dL Final   RDW 03/06/2021 13.7  11.5 - 15.5 % Final   Platelets 03/06/2021 209  150 - 400 K/uL Final   nRBC 03/06/2021 0.0  0.0 - 0.2 % Final   Neutrophils Relative % 03/06/2021 58  % Final   Neutro Abs 03/06/2021 3.1  1.7 - 7.7 K/uL Final   Lymphocytes Relative 03/06/2021 28  % Final   Lymphs Abs 03/06/2021 1.5  0.7 - 4.0 K/uL Final   Monocytes Relative 03/06/2021 10  % Final   Monocytes Absolute 03/06/2021 0.5  0.1 - 1.0 K/uL Final   Eosinophils  Relative 03/06/2021 2  % Final   Eosinophils Absolute 03/06/2021 0.1  0.0 - 0.5 K/uL Final   Basophils Relative 03/06/2021 1  % Final   Basophils Absolute 03/06/2021 0.0  0.0 - 0.1 K/uL Final   Immature Granulocytes 03/06/2021 1  % Final   Abs Immature Granulocytes 03/06/2021 0.03  0.00 - 0.07 K/uL Final   Performed at [facility], [place] [address] [place], [place] [address]   Sodium 03/06/2021 136  135 - 145 mmol/L Final   Potassium 03/06/2021 3.6  3.5 - 5.1 mmol/L Final   Chloride 03/06/2021 98  98 - 111 mmol/L Final   CO2 03/06/2021 27  22 - 32 mmol/L Final   Glucose, Bld 03/06/2021 122 (H)  70 - 99 mg/dL Final   Glucose reference range applies only to samples taken after fasting for at least 8 hours.   BUN 03/06/2021 19  8 - 23 mg/dL Final   Creatinine, Ser 03/06/2021 1.01  0.61 - 1.24 mg/dL Final   Calcium 03/06/2021 9.2  8.9 - 10.3 mg/dL Final   Total Protein 03/06/2021 6.8  6.5 - 8.1 g/dL Final   Albumin 03/06/2021 3.8  3.5 - 5.0 g/dL Final   AST 03/06/2021 22  15 - 41 U/L Final   ALT 03/06/2021 21  0 - 44 U/L Final   Alkaline Phosphatase 03/06/2021 55  38 - 126 U/L Final   Total Bilirubin 03/06/2021 0.6  0.3 - 1.2 mg/dL Final   GFR, Estimated 03/06/2021 >60  >60 mL/min Final   Comment: (NOTE) Calculated using the CKD-EPI Creatinine Equation (2021)    Anion gap 03/06/2021 11  5 - 15 Final   Performed at [facility] [address] [place], [place] [address]   Alcohol, Ethyl (B) 03/06/2021 38 (H)  <10 mg/dL Final   Comment: (NOTE) Lowest detectable limit for serum alcohol is 10 mg/dL.  For medical purposes only. Performed at [facility], [place] [address], [place], [place] [address]    Opiates 03/06/2021 NONE DETECTED  NONE DETECTED Final   Cocaine 03/06/2021 POSITIVE (A)  NONE DETECTED Final   Benzodiazepines 03/06/2021 NONE DETECTED  NONE DETECTED Final   Amphetamines 03/06/2021 NONE DETECTED  NONE DETECTED Final   Tetrahydrocannabinol 03/06/2021 NONE DETECTED  NONE DETECTED Final   Barbiturates 03/06/2021 NONE DETECTED  NONE DETECTED Final   Comment: (NOTE) DRUG SCREEN FOR MEDICAL PURPOSES ONLY.  IF CONFIRMATION IS NEEDED FOR ANY PURPOSE, NOTIFY LAB WITHIN 5 DAYS.  LOWEST DETECTABLE LIMITS FOR URINE DRUG SCREEN Drug Class                     Cutoff (ng/mL) Amphetamine  and metabolites    1000 Barbiturate and metabolites    200 Benzodiazepine                 086 Tricyclics and metabolites     300 Opiates and metabolites        300 Cocaine and metabolites        300 THC                            50 Performed at [facility], [place] [address], [place], [place] [address]    Salicylate Lvl 09/32/6712 <7.0 (L)  7.0 - 30.0 mg/dL Final   Performed at [facility] [address], [place], [place] [address]   Acetaminophen (Tylenol), Serum  03/06/2021 <10 (L)  10 - 30 ug/mL Final   Comment: (NOTE) Therapeutic concentrations vary significantly. A range of 10-30 ug/mL  may be an effective concentration for many patients. However, some  are best treated at concentrations outside of this range. Acetaminophen concentrations >150 ug/mL at 4 hours after ingestion  and >50 ug/mL at 12 hours after ingestion are often associated with  toxic reactions.  Performed at [facility], [place] [address] [place], [place] [address]    SARS Coronavirus 2 by RT PCR 03/06/2021 NEGATIVE  NEGATIVE Final   Comment: (NOTE) SARS-CoV-2 target nucleic acids are NOT DETECTED.  The SARS-CoV-2 RNA is generally detectable in upper respiratory specimens during the acute phase of infection. The lowest concentration of SARS-CoV-2 viral copies this assay can detect is 138 copies/mL. A negative result does not preclude SARS-Cov-2 infection and should not be used as the sole basis for treatment or other patient management decisions. A negative result may occur with  improper specimen collection/handling, submission of specimen other than nasopharyngeal swab, presence of viral mutation(s) within the areas targeted by this assay, and inadequate number of viral copies(<138 copies/mL). A negative result must be combined with clinical observations, patient history, and epidemiological information. The expected result is Negative.  Fact Sheet for Patients:   EntrepreneurPulse.com.au  Fact Sheet for Healthcare Providers:  IncredibleEmployment.be  This test is no                          t yet approved or cleared by the [place] FDA and  has been authorized for detection and/or diagnosis of SARS-CoV-2 by FDA under an Emergency Use Authorization (EUA). This EUA will remain  in effect (meaning this test can be used) for the duration of the COVID-19 declaration under Section 564(b)(1) of the Act, 21 U.S.C.section 360bbb-3(b)(1), unless the authorization is terminated  or revoked sooner.       Influenza A by PCR 03/06/2021 NEGATIVE  NEGATIVE Final   Influenza B by PCR 03/06/2021 NEGATIVE  NEGATIVE Final   Comment: (NOTE) The Xpert Xpress SARS-CoV-2/FLU/RSV plus assay is intended as an aid in the diagnosis of influenza from Nasopharyngeal swab specimens and should not be used as a sole basis for treatment. Nasal washings and aspirates are unacceptable for Xpert Xpress SARS-CoV-2/FLU/RSV testing.  Fact Sheet for Patients: EntrepreneurPulse.com.au  Fact Sheet for Healthcare Providers: IncredibleEmployment.be  This test is not yet approved or cleared by the [place] FDA and has been authorized for detection and/or diagnosis of SARS-CoV-2 by FDA under an Emergency Use Authorization (EUA). This EUA will remain in effect (meaning this test can be used) for the duration of the COVID-19 declaration under Section 564(b)(1) of the Act, 21 U.S.C. section 360bbb-3(b)(1), unless the authorization is terminated or revoked.  Performed at [facility], [place] [address], [place], [place] [address]   Admission on 01/25/2021, Discharged on 01/28/2021  Component Date Value Ref Range Status   Sodium 01/25/2021 138  135 - 145 mmol/L Final   Potassium 01/25/2021 4.4  3.5 - 5.1 mmol/L Final   Chloride 01/25/2021 104  98 - 111 mmol/L Final   CO2 01/25/2021 27  22 - 32  mmol/L Final   Glucose, Bld 01/25/2021 152 (H)  70 - 99 mg/dL Final   Glucose reference range applies only to samples taken after fasting for at least 8 hours.   BUN 01/25/2021 13  8 - 23 mg/dL Final   Creatinine,  Ser 01/25/2021 0.91  0.61 - 1.24 mg/dL Final   Calcium 01/25/2021 8.8 (L)  8.9 - 10.3 mg/dL Final   Total Protein 01/25/2021 6.2 (L)  6.5 - 8.1 g/dL Final   Albumin 01/25/2021 3.4 (L)  3.5 - 5.0 g/dL Final   AST 01/25/2021 24  15 - 41 U/L Final   ALT 01/25/2021 20  0 - 44 U/L Final   Alkaline Phosphatase 01/25/2021 58  38 - 126 U/L Final   Total Bilirubin 01/25/2021 0.6  0.3 - 1.2 mg/dL Final   GFR, Estimated 01/25/2021 >60  >60 mL/min Final   Comment: (NOTE) Calculated using the CKD-EPI Creatinine Equation (2021)    Anion gap 01/25/2021 7  5 - 15 Final   Performed at [facility] [address], [place], [place] [address]   Alcohol, Ethyl (B) 01/25/2021 <10  <10 mg/dL Final   Comment: (NOTE) Lowest detectable limit for serum alcohol is 10 mg/dL.  For medical purposes only. Performed at [facility], [place] [address] [place], [place] [address]    Salicylate Lvl 61/53/7943 <7.0 (L)  7.0 - 30.0 mg/dL Final   Performed at [facility] [address] [place], [place] [address]   Acetaminophen (Tylenol), Serum 01/25/2021 <10 (L)  10 - 30 ug/mL Final   Comment: (NOTE) Therapeutic concentrations vary significantly. A range of 10-30 ug/mL  may be an effective concentration for many patients. However, some  are best treated at concentrations outside of this range. Acetaminophen concentrations >150 ug/mL at 4 hours after ingestion  and >50 ug/mL at 12 hours after ingestion are often associated with  toxic reactions.  Performed at [facility], [place] [address] [place], [place] [address]    WBC 01/25/2021 5.1  4.0 - 10.5 K/uL Final   RBC 01/25/2021 4.83  4.22 - 5.81 MIL/uL Final   Hemoglobin 01/25/2021 13.7  13.0 - 17.0 g/dL Final   HCT 01/25/2021  42.8  39.0 - 52.0 % Final   MCV 01/25/2021 88.6  80.0 - 100.0 fL Final   MCH 01/25/2021 28.4  26.0 - 34.0 pg Final   MCHC 01/25/2021 32.0  30.0 - 36.0 g/dL Final   RDW 01/25/2021 14.0  11.5 - 15.5 % Final   Platelets 01/25/2021 173  150 - 400 K/uL Final   nRBC 01/25/2021 0.0  0.0 - 0.2 % Final   Performed at [facility] [address] [place], [place] [address]   Opiates 01/25/2021 NONE DETECTED  NONE DETECTED Final   Cocaine 01/25/2021 POSITIVE (A)  NONE DETECTED Final   Benzodiazepines 01/25/2021 NONE DETECTED  NONE DETECTED Final   Amphetamines 01/25/2021 NONE DETECTED  NONE DETECTED Final   Tetrahydrocannabinol 01/25/2021 POSITIVE (A)  NONE DETECTED Final   Barbiturates 01/25/2021 NONE DETECTED  NONE DETECTED Final   Comment: (NOTE) DRUG SCREEN FOR MEDICAL PURPOSES ONLY.  IF CONFIRMATION IS NEEDED FOR ANY PURPOSE, NOTIFY LAB WITHIN 5 DAYS.  LOWEST DETECTABLE LIMITS FOR URINE DRUG SCREEN Drug Class                     Cutoff (ng/mL) Amphetamine and metabolites    1000 Barbiturate and metabolites    200 Benzodiazepine                 403 Tricyclics and metabolites     300 Opiates and metabolites        300 Cocaine and metabolites  300 THC                            50 Performed at [facility], [place] [address], [place], [place] [address]    SARS Coronavirus 2 by RT PCR 01/25/2021 NEGATIVE  NEGATIVE Final   Comment: (NOTE) SARS-CoV-2 target nucleic acids are NOT DETECTED.  The SARS-CoV-2 RNA is generally detectable in upper respiratory specimens during the acute phase of infection. The lowest concentration of SARS-CoV-2 viral copies this assay can detect is 138 copies/mL. A negative result does not preclude SARS-Cov-2 infection and should not be used as the sole basis for treatment or other patient management decisions. A negative result may occur with  improper specimen collection/handling, submission of specimen other than nasopharyngeal swab,  presence of viral mutation(s) within the areas targeted by this assay, and inadequate number of viral copies(<138 copies/mL). A negative result must be combined with clinical observations, patient history, and epidemiological information. The expected result is Negative.  Fact Sheet for Patients:  EntrepreneurPulse.com.au  Fact Sheet for Healthcare Providers:  IncredibleEmployment.be  This test is no                          t yet approved or cleared by the [place] FDA and  has been authorized for detection and/or diagnosis of SARS-CoV-2 by FDA under an Emergency Use Authorization (EUA). This EUA will remain  in effect (meaning this test can be used) for the duration of the COVID-19 declaration under Section 564(b)(1) of the Act, 21 U.S.C.section 360bbb-3(b)(1), unless the authorization is terminated  or revoked sooner.       Influenza A by PCR 01/25/2021 NEGATIVE  NEGATIVE Final   Influenza B by PCR 01/25/2021 NEGATIVE  NEGATIVE Final   Comment: (NOTE) The Xpert Xpress SARS-CoV-2/FLU/RSV plus assay is intended as an aid in the diagnosis of influenza from Nasopharyngeal swab specimens and should not be used as a sole basis for treatment. Nasal washings and aspirates are unacceptable for Xpert Xpress SARS-CoV-2/FLU/RSV testing.  Fact Sheet for Patients: EntrepreneurPulse.com.au  Fact Sheet for Healthcare Providers: IncredibleEmployment.be  This test is not yet approved or cleared by the [place] FDA and has been authorized for detection and/or diagnosis of SARS-CoV-2 by FDA under an Emergency Use Authorization (EUA). This EUA will remain in effect (meaning this test can be used) for the duration of the COVID-19 declaration under Section 564(b)(1) of the Act, 21 U.S.C. section 360bbb-3(b)(1), unless the authorization is terminated or revoked.  Performed at [facility], [place] [address] [place], [place] [address]    Glucose-Capillary 01/25/2021 169 (H)  70 - 99 mg/dL Final   Glucose reference range applies only to samples taken after fasting for at least 8 hours.  Admission on 01/06/2021, Discharged on 01/12/2021  Component Date Value Ref Range Status   Glucose-Capillary 01/07/2021 180 (H)  70 - 99 mg/dL Final   Glucose reference range applies only to samples taken after fasting for at least 8 hours.   Sodium 01/07/2021 137  135 - 145 mmol/L Final   Potassium 01/07/2021 4.3  3.5 - 5.1 mmol/L Final   Chloride 01/07/2021 105  98 - 111 mmol/L Final   CO2 01/07/2021 28  22 - 32 mmol/L Final   Glucose, Bld 01/07/2021 178 (H)  70 - 99 mg/dL Final   Glucose reference range applies only to samples taken after fasting  for at least 8 hours.   BUN 01/07/2021 15  8 - 23 mg/dL Final   Creatinine, Ser 01/07/2021 0.87  0.61 - 1.24 mg/dL Final   Calcium 01/07/2021 8.9  8.9 - 10.3 mg/dL Final   GFR, Estimated 01/07/2021 >60  >60 mL/min Final   Comment: (NOTE) Calculated using the CKD-EPI Creatinine Equation (2021)    Anion gap 01/07/2021 4 (L)  5 - 15 Final   Performed at [facility], [place] [address], [place], [place] [address]   Glucose-Capillary 01/08/2021 131 (H)  70 - 99 mg/dL Final   Glucose reference range applies only to samples taken after fasting for at least 8 hours.   Comment 1 01/08/2021 Notify RN   Final   Glucose-Capillary 01/09/2021 135 (H)  70 - 99 mg/dL Final   Glucose reference range applies only to samples taken after fasting for at least 8 hours.   Glucose-Capillary 01/10/2021 158 (H)  70 - 99 mg/dL Final   Glucose reference range applies only to samples taken after fasting for at least 8 hours.   Glucose-Capillary 01/11/2021 148 (H)  70 - 99 mg/dL Final   Glucose reference range applies only to samples taken after fasting for at least 8 hours.   Comment 1 01/11/2021 Notify RN   Final   Comment 2 01/11/2021 Document in Chart   Final    Glucose-Capillary 01/12/2021 126 (H)  70 - 99 mg/dL Final   Glucose reference range applies only to samples taken after fasting for at least 8 hours.   Comment 1 01/12/2021 Notify RN   Final   Comment 2 01/12/2021 Document in Chart   Final  Admission on 01/05/2021, Discharged on 01/06/2021  Component Date Value Ref Range Status   Glucose-Capillary 01/06/2021 123 (H)  70 - 99 mg/dL Final   Glucose reference range applies only to samples taken after fasting for at least 8 hours.   SARSCOV2ONAVIRUS 2 AG 01/06/2021 NEGATIVE  NEGATIVE Final   Comment: (NOTE) SARS-CoV-2 antigen NOT DETECTED.   Negative results are presumptive.  Negative results do not preclude SARS-CoV-2 infection and should not be used as the sole basis for treatment or other patient management decisions, including infection  control decisions, particularly in the presence of clinical signs and  symptoms consistent with COVID-19, or in those who have been in contact with the virus.  Negative results must be combined with clinical observations, patient history, and epidemiological information. The expected result is Negative.  Fact Sheet for Patients: HandmadeRecipes.com.cy  Fact Sheet for Healthcare Providers: FuneralLife.at  This test is not yet approved or cleared by the [place] FDA and  has been authorized for detection and/or diagnosis of SARS-CoV-2 by FDA under an Emergency Use Authorization (EUA).  This EUA will remain in effect (meaning this test can be used) for the duration of  the COV                          ID-19 declaration under Section 564(b)(1) of the Act, 21 U.S.C. section 360bbb-3(b)(1), unless the authorization is terminated or revoked sooner.    Admission on 01/04/2021, Discharged on 01/05/2021  Component Date Value Ref Range Status   Sodium 01/04/2021 134 (L)  135 - 145 mmol/L Final   Potassium 01/04/2021 3.7  3.5 - 5.1 mmol/L Final   Chloride  01/04/2021 104  98 - 111 mmol/L Final   CO2 01/04/2021 25  22 - 32 mmol/L Final  Glucose, Bld 01/04/2021 145 (H)  70 - 99 mg/dL Final   Glucose reference range applies only to samples taken after fasting for at least 8 hours.   BUN 01/04/2021 13  8 - 23 mg/dL Final   Creatinine, Ser 01/04/2021 0.95  0.61 - 1.24 mg/dL Final   Calcium 01/04/2021 8.9  8.9 - 10.3 mg/dL Final   Total Protein 01/04/2021 6.7  6.5 - 8.1 g/dL Final   Albumin 01/04/2021 3.7  3.5 - 5.0 g/dL Final   AST 01/04/2021 25  15 - 41 U/L Final   ALT 01/04/2021 23  0 - 44 U/L Final   Alkaline Phosphatase 01/04/2021 64  38 - 126 U/L Final   Total Bilirubin 01/04/2021 0.5  0.3 - 1.2 mg/dL Final   GFR, Estimated 01/04/2021 >60  >60 mL/min Final   Comment: (NOTE) Calculated using the CKD-EPI Creatinine Equation (2021)    Anion gap 01/04/2021 5  5 - 15 Final   Performed at [facility] [address] [place], [place] [address]   Alcohol, Ethyl (B) 01/04/2021 <10  <10 mg/dL Final   Comment: (NOTE) Lowest detectable limit for serum alcohol is 10 mg/dL.  For medical purposes only. Performed at [facility], [place] [address], [place], [place] [address]    Salicylate Lvl 50/27/7412 <7.0 (L)  7.0 - 30.0 mg/dL Final   Performed at [facility] [address] [place], [place] [address]   Acetaminophen (Tylenol), Serum 01/04/2021 <10 (L)  10 - 30 ug/mL Final   Comment: (NOTE) Therapeutic concentrations vary significantly. A range of 10-30 ug/mL  may be an effective concentration for many patients. However, some  are best treated at concentrations outside of this range. Acetaminophen concentrations >150 ug/mL at 4 hours after ingestion  and >50 ug/mL at 12 hours after ingestion are often associated with  toxic reactions.  Performed at [facility], [place] [address], [place], [place] [address]    WBC 01/04/2021 4.6  4.0 - 10.5 K/uL Final   RBC 01/04/2021 4.90  4.22 - 5.81 MIL/uL Final   Hemoglobin  01/04/2021 14.0  13.0 - 17.0 g/dL Final   HCT 01/04/2021 42.3  39.0 - 52.0 % Final   MCV 01/04/2021 86.3  80.0 - 100.0 fL Final   MCH 01/04/2021 28.6  26.0 - 34.0 pg Final   MCHC 01/04/2021 33.1  30.0 - 36.0 g/dL Final   RDW 01/04/2021 14.4  11.5 - 15.5 % Final   Platelets 01/04/2021 204  150 - 400 K/uL Final   nRBC 01/04/2021 0.0  0.0 - 0.2 % Final   Performed at [facility] [address] [place], [place] [address]   Opiates 01/04/2021 NONE DETECTED  NONE DETECTED Final   Cocaine 01/04/2021 POSITIVE (A)  NONE DETECTED Final   Benzodiazepines 01/04/2021 NONE DETECTED  NONE DETECTED Final   Amphetamines 01/04/2021 NONE DETECTED  NONE DETECTED Final   Tetrahydrocannabinol 01/04/2021 POSITIVE (A)  NONE DETECTED Final   Barbiturates 01/04/2021 NONE DETECTED  NONE DETECTED Final   Comment: (NOTE) DRUG SCREEN FOR MEDICAL PURPOSES ONLY.  IF CONFIRMATION IS NEEDED FOR ANY PURPOSE, NOTIFY LAB WITHIN 5 DAYS.  LOWEST DETECTABLE LIMITS FOR URINE DRUG SCREEN Drug Class                     Cutoff (ng/mL) Amphetamine and metabolites    1000 Barbiturate and metabolites    200 Benzodiazepine  875 Tricyclics and metabolites     300 Opiates and metabolites        300 Cocaine and metabolites        300 THC                            50 Performed at [facility], [place] [address] [place], [place] [address]    SARS Coronavirus 2 by RT PCR 01/04/2021 NEGATIVE  NEGATIVE Final   Comment: (NOTE) SARS-CoV-2 target nucleic acids are NOT DETECTED.  The SARS-CoV-2 RNA is generally detectable in upper respiratory specimens during the acute phase of infection. The lowest concentration of SARS-CoV-2 viral copies this assay can detect is 138 copies/mL. A negative result does not preclude SARS-Cov-2 infection and should not be used as the sole basis for treatment or other patient management decisions. A negative result may occur with  improper specimen collection/handling,  submission of specimen other than nasopharyngeal swab, presence of viral mutation(s) within the areas targeted by this assay, and inadequate number of viral copies(<138 copies/mL). A negative result must be combined with clinical observations, patient history, and epidemiological information. The expected result is Negative.  Fact Sheet for Patients:  EntrepreneurPulse.com.au  Fact Sheet for Healthcare Providers:  IncredibleEmployment.be  This test is no                          t yet approved or cleared by the [place] FDA and  has been authorized for detection and/or diagnosis of SARS-CoV-2 by FDA under an Emergency Use Authorization (EUA). This EUA will remain  in effect (meaning this test can be used) for the duration of the COVID-19 declaration under Section 564(b)(1) of the Act, 21 U.S.C.section 360bbb-3(b)(1), unless the authorization is terminated  or revoked sooner.       Influenza A by PCR 01/04/2021 NEGATIVE  NEGATIVE Final   Influenza B by PCR 01/04/2021 NEGATIVE  NEGATIVE Final   Comment: (NOTE) The Xpert Xpress SARS-CoV-2/FLU/RSV plus assay is intended as an aid in the diagnosis of influenza from Nasopharyngeal swab specimens and should not be used as a sole basis for treatment. Nasal washings and aspirates are unacceptable for Xpert Xpress SARS-CoV-2/FLU/RSV testing.  Fact Sheet for Patients: EntrepreneurPulse.com.au  Fact Sheet for Healthcare Providers: IncredibleEmployment.be  This test is not yet approved or cleared by the [place] FDA and has been authorized for detection and/or diagnosis of SARS-CoV-2 by FDA under an Emergency Use Authorization (EUA). This EUA will remain in effect (meaning this test can be used) for the duration of the COVID-19 declaration under Section 564(b)(1) of the Act, 21 U.S.C. section 360bbb-3(b)(1), unless the authorization is terminated  or revoked.  Performed at [facility], [place] [address], [place],  [address]    Glucose-Capillary 01/04/2021 150 (H)  70 - 99 mg/dL Final   Glucose reference range applies only to samples taken after fasting for at least 8 hours.  Admission on 12/08/2020, Discharged on 12/16/2020  Component Date Value Ref Range Status   Glucose-Capillary 12/09/2020 172 (H)  70 - 99 mg/dL Final   Glucose reference range applies only to samples taken after fasting for at least 8 hours.   TSH 12/10/2020 0.977  0.350 - 4.500 uIU/mL Final   Comment: Performed by a 3rd Generation assay with a functional sensitivity of <=0.01 uIU/mL. Performed at [facility], [place] [address] [place], [place]  [address]    Hgb A1c MFr Bld 12/10/2020 6.8 (H)  4.8 - 5.6 % Final   Comment: (NOTE) Pre diabetes:          5.7%-6.4%  Diabetes:              >6.4%  Glycemic control for   <7.0% adults with diabetes    Mean Plasma Glucose 12/10/2020 148.46  mg/dL Final   Performed at [facility], [place] [address], [place], [place] [address]   Cholesterol 12/10/2020 177  0 - 200 mg/dL Final   Triglycerides 12/10/2020 124  <150 mg/dL Final   HDL 12/10/2020 50  >40 mg/dL Final   Total CHOL/HDL Ratio 12/10/2020 3.5  RATIO Final   VLDL 12/10/2020 25  0 - 40 mg/dL Final   LDL Cholesterol 12/10/2020 102 (H)  0 - 99 mg/dL Final   Comment:        Total Cholesterol/HDL:CHD Risk Coronary Heart Disease Risk Table                     Men   Women  1/2 Average Risk   3.4   3.3  Average Risk       5.0   4.4  2 X Average Risk   9.6   7.1  3 X Average Risk  23.4   11.0        Use the calculated Patient Ratio above and the CHD Risk Table to determine the patient's CHD Risk.        ATP III CLASSIFICATION (LDL):  <100     mg/dL   Optimal  100-129  mg/dL   Near or Above                    Optimal  130-159  mg/dL   Borderline  160-189  mg/dL   High  >190     mg/dL   Very High Performed at [facility] [address] [place], [place] [address]    Glucose-Capillary 12/09/2020 191 (H)  70 - 99 mg/dL Final   Glucose reference range applies only to samples taken after fasting for at least 8 hours.   Glucose-Capillary 12/10/2020 118 (H)  70 - 99 mg/dL Final   Glucose reference range applies only to samples taken after fasting for at least 8 hours.   Glucose-Capillary 12/10/2020 136 (H)  70 - 99 mg/dL Final   Glucose reference range applies only to samples taken after fasting for at least 8 hours.   Glucose-Capillary 12/10/2020 125 (H)  70 - 99 mg/dL Final   Glucose reference range applies only to samples taken after fasting for at least 8 hours.   Glucose-Capillary 12/11/2020 96  70 - 99 mg/dL Final   Glucose reference range applies only to samples taken after fasting for at least 8 hours.   Glucose-Capillary 12/11/2020 92  70 - 99 mg/dL Final   Glucose reference range applies only to samples taken after fasting for at least 8 hours.   SARS Coronavirus 2 by RT PCR 12/11/2020 NEGATIVE  NEGATIVE Final   Comment: (NOTE) SARS-CoV-2 target nucleic acids are NOT DETECTED.  The SARS-CoV-2 RNA is generally detectable in upper respiratory specimens during the acute phase of infection. The lowest concentration of SARS-CoV-2 viral copies this assay can detect is 138 copies/mL. A negative result does not preclude SARS-Cov-2 infection and should not be used as the sole basis for treatment or other patient management decisions. A  negative result may occur with  improper specimen collection/handling, submission of specimen other than nasopharyngeal swab, presence of viral mutation(s) within the areas targeted by this assay, and inadequate number of viral copies(<138 copies/mL). A negative result must be combined with clinical observations, patient history, and epidemiological information. The expected result is Negative.  Fact Sheet for Patients:   EntrepreneurPulse.com.au  Fact Sheet for Healthcare Providers:  IncredibleEmployment.be  This test is no                          t yet approved or cleared by the [place] FDA and  has been authorized for detection and/or diagnosis of SARS-CoV-2 by FDA under an Emergency Use Authorization (EUA). This EUA will remain  in effect (meaning this test can be used) for the duration of the COVID-19 declaration under Section 564(b)(1) of the Act, 21 U.S.C.section 360bbb-3(b)(1), unless the authorization is terminated  or revoked sooner.       Influenza A by PCR 12/11/2020 NEGATIVE  NEGATIVE Final   Influenza B by PCR 12/11/2020 NEGATIVE  NEGATIVE Final   Comment: (NOTE) The Xpert Xpress SARS-CoV-2/FLU/RSV plus assay is intended as an aid in the diagnosis of influenza from Nasopharyngeal swab specimens and should not be used as a sole basis for treatment. Nasal washings and aspirates are unacceptable for Xpert Xpress SARS-CoV-2/FLU/RSV testing.  Fact Sheet for Patients: EntrepreneurPulse.com.au  Fact Sheet for Healthcare Providers: IncredibleEmployment.be  This test is not yet approved or cleared by the [place] FDA and has been authorized for detection and/or diagnosis of SARS-CoV-2 by FDA under an Emergency Use Authorization (EUA). This EUA will remain in effect (meaning this test can be used) for the duration of the COVID-19 declaration under Section 564(b)(1) of the Act, 21 U.S.C. section 360bbb-3(b)(1), unless the authorization is terminated or revoked.  Performed at [facility], [place] [address], [place], [place] [address]    Glucose-Capillary 12/11/2020 107 (H)  70 - 99 mg/dL Final   Glucose reference range applies only to samples taken after fasting for at least 8 hours.   Glucose-Capillary 12/12/2020 114 (H)  70 - 99 mg/dL Final   Glucose reference range applies only to  samples taken after fasting for at least 8 hours.   Comment 1 12/12/2020 Notify RN   Final   Glucose-Capillary 12/12/2020 149 (H)  70 - 99 mg/dL Final   Glucose reference range applies only to samples taken after fasting for at least 8 hours.   Glucose-Capillary 12/12/2020 138 (H)  70 - 99 mg/dL Final   Glucose reference range applies only to samples taken after fasting for at least 8 hours.   Glucose-Capillary 12/13/2020 114 (H)  70 - 99 mg/dL Final   Glucose reference range applies only to samples taken after fasting for at least 8 hours.   Glucose-Capillary 12/13/2020 107 (H)  70 - 99 mg/dL Final   Glucose reference range applies only to samples taken after fasting for at least 8 hours.   Glucose-Capillary 12/14/2020 139 (H)  70 - 99 mg/dL Final   Glucose reference range applies only to samples taken after fasting for at least 8 hours.   Glucose-Capillary 12/14/2020 105 (H)  70 - 99 mg/dL Final   Glucose reference range applies only to samples taken after fasting for at least 8 hours.   Glucose-Capillary 12/15/2020 121 (H)  70 - 99 mg/dL Final   Glucose reference range applies only to samples taken after  fasting for at least 8 hours.   Glucose-Capillary 12/16/2020 182 (H)  70 - 99 mg/dL Final   Glucose reference range applies only to samples taken after fasting for at least 8 hours.   Comment 1 12/16/2020 Notify RN   Final   Glucose-Capillary 12/15/2020 172 (H)  70 - 99 mg/dL Final   Glucose reference range applies only to samples taken after fasting for at least 8 hours.   Comment 1 12/15/2020 Notify RN   Final  Admission on 12/05/2020, Discharged on 12/08/2020  Component Date Value Ref Range Status   SARS Coronavirus 2 by RT PCR 12/05/2020 NEGATIVE  NEGATIVE Final   Comment: (NOTE) SARS-CoV-2 target nucleic acids are NOT DETECTED.  The SARS-CoV-2 RNA is generally detectable in upper respiratory specimens during the acute phase of infection. The lowest concentration of SARS-CoV-2  viral copies this assay can detect is 138 copies/mL. A negative result does not preclude SARS-Cov-2 infection and should not be used as the sole basis for treatment or other patient management decisions. A negative result may occur with  improper specimen collection/handling, submission of specimen other than nasopharyngeal swab, presence of viral mutation(s) within the areas targeted by this assay, and inadequate number of viral copies(<138 copies/mL). A negative result must be combined with clinical observations, patient history, and epidemiological information. The expected result is Negative.  Fact Sheet for Patients:  EntrepreneurPulse.com.au  Fact Sheet for Healthcare Providers:  IncredibleEmployment.be  This test is no                          t yet approved or cleared by the [place] FDA and  has been authorized for detection and/or diagnosis of SARS-CoV-2 by FDA under an Emergency Use Authorization (EUA). This EUA will remain  in effect (meaning this test can be used) for the duration of the COVID-19 declaration under Section 564(b)(1) of the Act, 21 U.S.C.section 360bbb-3(b)(1), unless the authorization is terminated  or revoked sooner.       Influenza A by PCR 12/05/2020 NEGATIVE  NEGATIVE Final   Influenza B by PCR 12/05/2020 NEGATIVE  NEGATIVE Final   Comment: (NOTE) The Xpert Xpress SARS-CoV-2/FLU/RSV plus assay is intended as an aid in the diagnosis of influenza from Nasopharyngeal swab specimens and should not be used as a sole basis for treatment. Nasal washings and aspirates are unacceptable for Xpert Xpress SARS-CoV-2/FLU/RSV testing.  Fact Sheet for Patients: EntrepreneurPulse.com.au  Fact Sheet for Healthcare Providers: IncredibleEmployment.be  This test is not yet approved or cleared by the [place] FDA and has been authorized for detection and/or diagnosis of SARS-CoV-2  by FDA under an Emergency Use Authorization (EUA). This EUA will remain in effect (meaning this test can be used) for the duration of the COVID-19 declaration under Section 564(b)(1) of the Act, 21 U.S.C. section 360bbb-3(b)(1), unless the authorization is terminated or revoked.  Performed at [facility], [place] [address] [place], [place] [address]    Sodium 12/05/2020 135  135 - 145 mmol/L Final   Potassium 12/05/2020 3.9  3.5 - 5.1 mmol/L Final   Chloride 12/05/2020 105  98 - 111 mmol/L Final   CO2 12/05/2020 24  22 - 32 mmol/L Final   Glucose, Bld 12/05/2020 222 (H)  70 - 99 mg/dL Final   Glucose reference range applies only to samples taken after fasting for at least 8 hours.   BUN 12/05/2020 11  8 - 23 mg/dL Final  Creatinine, Ser 12/05/2020 1.01  0.61 - 1.24 mg/dL Final   Calcium 12/05/2020 9.0  8.9 - 10.3 mg/dL Final   Total Protein 12/05/2020 6.9  6.5 - 8.1 g/dL Final   Albumin 12/05/2020 3.7  3.5 - 5.0 g/dL Final   AST 12/05/2020 18  15 - 41 U/L Final   ALT 12/05/2020 17  0 - 44 U/L Final   Alkaline Phosphatase 12/05/2020 50  38 - 126 U/L Final   Total Bilirubin 12/05/2020 0.7  0.3 - 1.2 mg/dL Final   GFR, Estimated 12/05/2020 >60  >60 mL/min Final   Comment: (NOTE) Calculated using the CKD-EPI Creatinine Equation (2021)    Anion gap 12/05/2020 6  5 - 15 Final   Performed at [facility], [place] [address], [place], [place] [address]   Alcohol, Ethyl (B) 12/05/2020 <10  <10 mg/dL Final   Comment: (NOTE) Lowest detectable limit for serum alcohol is 10 mg/dL.  For medical purposes only. Performed at [facility], [place] [address] [place], [place] [address]    Opiates 12/05/2020 NONE DETECTED  NONE DETECTED Final   Cocaine 12/05/2020 POSITIVE (A)  NONE DETECTED Final   Benzodiazepines 12/05/2020 NONE DETECTED  NONE DETECTED Final   Amphetamines 12/05/2020 NONE DETECTED  NONE DETECTED Final   Tetrahydrocannabinol  12/05/2020 NONE DETECTED  NONE DETECTED Final   Barbiturates 12/05/2020 NONE DETECTED  NONE DETECTED Final   Comment: (NOTE) DRUG SCREEN FOR MEDICAL PURPOSES ONLY.  IF CONFIRMATION IS NEEDED FOR ANY PURPOSE, NOTIFY LAB WITHIN 5 DAYS.  LOWEST DETECTABLE LIMITS FOR URINE DRUG SCREEN Drug Class                     Cutoff (ng/mL) Amphetamine and metabolites    1000 Barbiturate and metabolites    200 Benzodiazepine                 867 Tricyclics and metabolites     300 Opiates and metabolites        300 Cocaine and metabolites        300 THC                            50 Performed at [facility], [place] [address], [place], [place] [address]    WBC 12/05/2020 4.6  4.0 - 10.5 K/uL Final   RBC 12/05/2020 5.17  4.22 - 5.81 MIL/uL Final   Hemoglobin 12/05/2020 14.8  13.0 - 17.0 g/dL Final   HCT 12/05/2020 44.3  39.0 - 52.0 % Final   MCV 12/05/2020 85.7  80.0 - 100.0 fL Final   MCH 12/05/2020 28.6  26.0 - 34.0 pg Final   MCHC 12/05/2020 33.4  30.0 - 36.0 g/dL Final   RDW 12/05/2020 13.9  11.5 - 15.5 % Final   Platelets 12/05/2020 212  150 - 400 K/uL Final   nRBC 12/05/2020 0.0  0.0 - 0.2 % Final   Neutrophils Relative % 12/05/2020 71  % Final   Neutro Abs 12/05/2020 3.3  1.7 - 7.7 K/uL Final   Lymphocytes Relative 12/05/2020 21  % Final   Lymphs Abs 12/05/2020 1.0  0.7 - 4.0 K/uL Final   Monocytes Relative 12/05/2020 7  % Final   Monocytes Absolute 12/05/2020 0.3  0.1 - 1.0 K/uL Final   Eosinophils Relative 12/05/2020 1  % Final   Eosinophils Absolute 12/05/2020 0.1  0.0 - 0.5 K/uL Final   Basophils  Relative 12/05/2020 0  % Final   Basophils Absolute 12/05/2020 0.0  0.0 - 0.1 K/uL Final   Immature Granulocytes 12/05/2020 0  % Final   Abs Immature Granulocytes 12/05/2020 0.01  0.00 - 0.07 K/uL Final   Performed at [facility], [place] [address], [place], [place] [address]   Acetaminophen (Tylenol), Serum 12/05/2020 <10 (L)  10 - 30 ug/mL Final    Comment: (NOTE) Therapeutic concentrations vary significantly. A range of 10-30 ug/mL  may be an effective concentration for many patients. However, some  are best treated at concentrations outside of this range. Acetaminophen concentrations >150 ug/mL at 4 hours after ingestion  and >50 ug/mL at 12 hours after ingestion are often associated with  toxic reactions.  Performed at [facility], [place] [address], [place], [place] [address]    Salicylate Lvl 78/67/5449 <7.0 (L)  7.0 - 30.0 mg/dL Final   Performed at [place] [address] [place], [place] [address]   Glucose-Capillary 12/05/2020 191 (H)  70 - 99 mg/dL Final   Glucose reference range applies only to samples taken after fasting for at least 8 hours.   Glucose-Capillary 12/08/2020 107 (H)  70 - 99 mg/dL Final   Glucose reference range applies only to samples taken after fasting for at least 8 hours.   SARS Coronavirus 2 by RT PCR 12/08/2020 NEGATIVE  NEGATIVE Final   Comment: (NOTE) SARS-CoV-2 target nucleic acids are NOT DETECTED.  The SARS-CoV-2 RNA is generally detectable in upper respiratory specimens during the acute phase of infection. The lowest concentration of SARS-CoV-2 viral copies this assay can detect is 138 copies/mL. A negative result does not preclude SARS-Cov-2 infection and should not be used as the sole basis for treatment or other patient management decisions. A negative result may occur with  improper specimen collection/handling, submission of specimen other than nasopharyngeal swab, presence of viral mutation(s) within the areas targeted by this assay, and inadequate number of viral copies(<138 copies/mL). A negative result must be combined with clinical observations, patient history, and epidemiological information. The expected result is Negative.  Fact Sheet for Patients:  EntrepreneurPulse.com.au  Fact Sheet for Healthcare Providers:   IncredibleEmployment.be  This test is no                          t yet approved or cleared by the [place] FDA and  has been authorized for detection and/or diagnosis of SARS-CoV-2 by FDA under an Emergency Use Authorization (EUA). This EUA will remain  in effect (meaning this test can be used) for the duration of the COVID-19 declaration under Section 564(b)(1) of the Act, 21 U.S.C.section 360bbb-3(b)(1), unless the authorization is terminated  or revoked sooner.       Influenza A by PCR 12/08/2020 NEGATIVE  NEGATIVE Final   Influenza B by PCR 12/08/2020 NEGATIVE  NEGATIVE Final   Comment: (NOTE) The Xpert Xpress SARS-CoV-2/FLU/RSV plus assay is intended as an aid in the diagnosis of influenza from Nasopharyngeal swab specimens and should not be used as a sole basis for treatment. Nasal washings and aspirates are unacceptable for Xpert Xpress SARS-CoV-2/FLU/RSV testing.  Fact Sheet for Patients: EntrepreneurPulse.com.au  Fact Sheet for Healthcare Providers: IncredibleEmployment.be  This test is not yet approved or cleared by the [place] FDA and has been authorized for detection and/or diagnosis of SARS-CoV-2 by FDA under an Emergency Use Authorization (EUA). This EUA will remain in effect (meaning  this test can be used) for the duration of the COVID-19 declaration under Section 564(b)(1) of the Act, 21 U.S.C. section 360bbb-3(b)(1), unless the authorization is terminated or revoked.  Performed at [facility], [place] [address], [place], [place] [address]   Admission on 10/09/2020, Discharged on 10/11/2020  Component Date Value Ref Range Status   Glucose-Capillary 10/09/2020 153 (H)  70 - 99 mg/dL Final   Glucose reference range applies only to samples taken after fasting for at least 8 hours.   pH, Ven 10/09/2020 7.381  7.250 - 7.430 Final   pCO2, Ven 10/09/2020 42.8 (L)  44.0 - 60.0 mmHg  Final   pO2, Ven 10/09/2020 37.0  32.0 - 45.0 mmHg Final   Bicarbonate 10/09/2020 25.4  20.0 - 28.0 mmol/L Final   TCO2 10/09/2020 27  22 - 32 mmol/L Final   O2 Saturation 10/09/2020 70.0  % Final   Acid-Base Excess 10/09/2020 0.0  0.0 - 2.0 mmol/L Final   Sodium 10/09/2020 143  135 - 145 mmol/L Final   Potassium 10/09/2020 3.8  3.5 - 5.1 mmol/L Final   Calcium, Ion 10/09/2020 1.26  1.15 - 1.40 mmol/L Final   HCT 10/09/2020 41.0  39.0 - 52.0 % Final   Hemoglobin 10/09/2020 13.9  13.0 - 17.0 g/dL Final   Sample type 10/09/2020 MIXED VENOUS SAMPLE   Final   Glucose-Capillary 10/09/2020 127 (H)  70 - 99 mg/dL Final   Glucose reference range applies only to samples taken after fasting for at least 8 hours.   SARS Coronavirus 2 10/09/2020 NEGATIVE  NEGATIVE Final   Comment: (NOTE) SARS-CoV-2 target nucleic acids are NOT DETECTED.  The SARS-CoV-2 RNA is generally detectable in upper and lower respiratory specimens during the acute phase of infection. Negative results do not preclude SARS-CoV-2 infection, do not rule out co-infections with other pathogens, and should not be used as the sole basis for treatment or other patient management decisions. Negative results must be combined with clinical observations, patient history, and epidemiological information. The expected result is Negative.  Fact Sheet for Patients: SugarRoll.be  Fact Sheet for Healthcare Providers: https://www.woods-mathews.com/  This test is not yet approved or cleared by the [place] FDA and  has been authorized for detection and/or diagnosis of SARS-CoV-2 by FDA under an Emergency Use Authorization (EUA). This EUA will remain  in effect (meaning this test can be used) for the duration of the COVID-19 declaration under Se                          ction 564(b)(1) of the Act, 21 U.S.C. section 360bbb-3(b)(1), unless the authorization is terminated or revoked  sooner.  Performed at [facility], [place] [address] [place],  [address]    Weight 10/09/2020 3,120  oz Final   Height 10/09/2020 70  in Final   BP 10/09/2020 136/80  mmHg Final   Single Plane A2C EF 10/09/2020 48.1  % Final   Single Plane A4C EF 10/09/2020 35.3  % Final   Calc EF 10/09/2020 44.3  % Final   S' Lateral 10/09/2020 4.50  cm Final   AR max vel 10/09/2020 1.78  cm2 Final   AV Area VTI 10/09/2020 1.71  cm2 Final   AV Mean grad 10/09/2020 3.0  mmHg Final   AV Peak grad 10/09/2020 5.7  mmHg Final   Ao pk vel 10/09/2020 1.19  m/s Final   Area-P 1/2 10/09/2020 4.80  cm2  Final   AV Area mean vel 10/09/2020 1.69  cm2 Final   Glucose-Capillary 10/09/2020 96  70 - 99 mg/dL Final   Glucose reference range applies only to samples taken after fasting for at least 8 hours.   Sodium 10/10/2020 136  135 - 145 mmol/L Final   Potassium 10/10/2020 4.1  3.5 - 5.1 mmol/L Final   Chloride 10/10/2020 105  98 - 111 mmol/L Final   CO2 10/10/2020 24  22 - 32 mmol/L Final   Glucose, Bld 10/10/2020 132 (H)  70 - 99 mg/dL Final   Glucose reference range applies only to samples taken after fasting for at least 8 hours.   BUN 10/10/2020 13  8 - 23 mg/dL Final   Creatinine, Ser 10/10/2020 0.93  0.61 - 1.24 mg/dL Final   Calcium 10/10/2020 9.2  8.9 - 10.3 mg/dL Final   GFR, Estimated 10/10/2020 >60  >60 mL/min Final   Comment: (NOTE) Calculated using the CKD-EPI Creatinine Equation (2021)    Anion gap 10/10/2020 7  5 - 15 Final   Performed at [facility] [address] [place], [place] [address]   WBC 10/10/2020 5.2  4.0 - 10.5 K/uL Final   RBC 10/10/2020 5.10  4.22 - 5.81 MIL/uL Final   Hemoglobin 10/10/2020 14.6  13.0 - 17.0 g/dL Final   HCT 10/10/2020 43.0  39.0 - 52.0 % Final   MCV 10/10/2020 84.3  80.0 - 100.0 fL Final   MCH 10/10/2020 28.6  26.0 - 34.0 pg Final   MCHC 10/10/2020 34.0  30.0 - 36.0 g/dL Final   RDW 10/10/2020 14.3  11.5 - 15.5 % Final   Platelets  10/10/2020 173  150 - 400 K/uL Final   nRBC 10/10/2020 0.0  0.0 - 0.2 % Final   Performed at [facility] [address] [place], [place] [address]   Hgb A1c MFr Bld 10/10/2020 8.2 (H)  4.8 - 5.6 % Final   Comment: (NOTE) Pre diabetes:          5.7%-6.4%  Diabetes:              >6.4%  Glycemic control for   <7.0% adults with diabetes    Mean Plasma Glucose 10/10/2020 188.64  mg/dL Final   Performed at [facility], [place] [address] [place], [place] [address]   Cholesterol 10/10/2020 135  0 - 200 mg/dL Final   Triglycerides 10/10/2020 95  <150 mg/dL Final   HDL 10/10/2020 34 (L)  >40 mg/dL Final   Total CHOL/HDL Ratio 10/10/2020 4.0  RATIO Final   VLDL 10/10/2020 19  0 - 40 mg/dL Final   LDL Cholesterol 10/10/2020 82  0 - 99 mg/dL Final   Comment:        Total Cholesterol/HDL:CHD Risk Coronary Heart Disease Risk Table                     Men   Women  1/2 Average Risk   3.4   3.3  Average Risk       5.0   4.4  2 X Average Risk   9.6   7.1  3 X Average Risk  23.4   11.0        Use the calculated Patient Ratio above and the CHD Risk Table to determine the patient's CHD Risk.        ATP III CLASSIFICATION (LDL):  <100     mg/dL   Optimal  100-129  mg/dL   Near or Above                    Optimal  130-159  mg/dL   Borderline  160-189  mg/dL   High  >190     mg/dL   Very High Performed at [facility] [address] [place], [place] [address]    Glucose-Capillary 10/09/2020 179 (H)  70 - 99 mg/dL Final   Glucose reference range applies only to samples taken after fasting for at least 8 hours.   pH, Arterial 10/09/2020 7.428  7.350 - 7.450 Final   pCO2 arterial 10/09/2020 35.2  32.0 - 48.0 mmHg Final   pO2, Arterial 10/09/2020 100  83.0 - 108.0 mmHg Final   Bicarbonate 10/09/2020 23.2  20.0 - 28.0 mmol/L Final   TCO2 10/09/2020 24  22 - 32 mmol/L Final   O2 Saturation 10/09/2020 98.0  % Final   Acid-base deficit 10/09/2020 1.0  0.0 - 2.0 mmol/L Final    Sodium 10/09/2020 142  135 - 145 mmol/L Final   Potassium 10/09/2020 3.8  3.5 - 5.1 mmol/L Final   Calcium, Ion 10/09/2020 1.23  1.15 - 1.40 mmol/L Final   HCT 10/09/2020 41.0  39.0 - 52.0 % Final   Hemoglobin 10/09/2020 13.9  13.0 - 17.0 g/dL Final   Sample type 10/09/2020 ARTERIAL   Final   Glucose-Capillary 10/10/2020 140 (H)  70 - 99 mg/dL Final   Glucose reference range applies only to samples taken after fasting for at least 8 hours.   Total Protein 10/10/2020 6.0 (L)  6.5 - 8.1 g/dL Final   Albumin 10/10/2020 3.3 (L)  3.5 - 5.0 g/dL Final   AST 10/10/2020 21  15 - 41 U/L Final   ALT 10/10/2020 19  0 - 44 U/L Final   Alkaline Phosphatase 10/10/2020 52  38 - 126 U/L Final   Total Bilirubin 10/10/2020 0.8  0.3 - 1.2 mg/dL Final   Bilirubin, Direct 10/10/2020 0.1  0.0 - 0.2 mg/dL Final   Indirect Bilirubin 10/10/2020 0.7  0.3 - 0.9 mg/dL Final   Performed at [facility] [address], [place], [place] [address]   MRSA, PCR 10/10/2020 NEGATIVE  NEGATIVE Final   Staphylococcus aureus 10/10/2020 NEGATIVE  NEGATIVE Final   Comment: (NOTE) The Xpert SA Assay (FDA approved for NASAL specimens in patients 61 years of age and older), is one component of a comprehensive surveillance program. It is not intended to diagnose infection nor to guide or monitor treatment. Performed at [facility], [place] [address] [place], [place] [address]    Glucose-Capillary 10/10/2020 75  70 - 99 mg/dL Final   Glucose reference range applies only to samples taken after fasting for at least 8 hours.   Glucose-Capillary 10/10/2020 78  70 - 99 mg/dL Final   Glucose reference range applies only to samples taken after fasting for at least 8 hours.   Sodium 10/11/2020 136  135 - 145 mmol/L Final   Potassium 10/11/2020 4.0  3.5 - 5.1 mmol/L Final   Chloride 10/11/2020 105  98 - 111 mmol/L Final   CO2 10/11/2020 21 (L)  22 - 32 mmol/L Final   Glucose, Bld 10/11/2020 224 (H)  70 - 99 mg/dL Final    Glucose reference range applies only to samples taken after fasting for at least 8 hours.   BUN 10/11/2020 14  8 - 23 mg/dL Final   Creatinine, Ser  10/11/2020 1.06  0.61 - 1.24 mg/dL Final   Calcium 10/11/2020 9.2  8.9 - 10.3 mg/dL Final   GFR, Estimated 10/11/2020 >60  >60 mL/min Final   Comment: (NOTE) Calculated using the CKD-EPI Creatinine Equation (2021)    Anion gap 10/11/2020 10  5 - 15 Final   Performed at [facility], [place] [address], [place], [place] [address]   WBC 10/11/2020 4.9  4.0 - 10.5 K/uL Final   RBC 10/11/2020 4.96  4.22 - 5.81 MIL/uL Final   Hemoglobin 10/11/2020 14.0  13.0 - 17.0 g/dL Final   HCT 10/11/2020 41.9  39.0 - 52.0 % Final   MCV 10/11/2020 84.5  80.0 - 100.0 fL Final   MCH 10/11/2020 28.2  26.0 - 34.0 pg Final   MCHC 10/11/2020 33.4  30.0 - 36.0 g/dL Final   RDW 10/11/2020 14.3  11.5 - 15.5 % Final   Platelets 10/11/2020 172  150 - 400 K/uL Final   nRBC 10/11/2020 0.0  0.0 - 0.2 % Final   Performed at [facility] [address], [place], [place] [address]   Glucose-Capillary 10/10/2020 79  70 - 99 mg/dL Final   Glucose reference range applies only to samples taken after fasting for at least 8 hours.   Activated Clotting Time 10/10/2020 202  seconds Final   Activated Clotting Time 10/10/2020 457  seconds Final   Glucose-Capillary 10/10/2020 250 (H)  70 - 99 mg/dL Final   Glucose reference range applies only to samples taken after fasting for at least 8 hours.   Glucose-Capillary 10/11/2020 127 (H)  70 - 99 mg/dL Final   Glucose reference range applies only to samples taken after fasting for at least 8 hours.   Glucose-Capillary 10/10/2020 76  70 - 99 mg/dL Final   Glucose reference range applies only to samples taken after fasting for at least 8 hours.  Office Visit on 10/07/2020  Component Date Value Ref Range Status   Glucose 10/07/2020 193 (H)  65 - 99 mg/dL Final   BUN 10/07/2020 11  8 - 27 mg/dL Final   Creatinine, Ser 10/07/2020  0.95  0.76 - 1.27 mg/dL Final   eGFR 10/07/2020 90  >59 mL/min/1.73 Final   BUN/Creatinine Ratio 10/07/2020 12  10 - 24 Final   Sodium 10/07/2020 139  134 - 144 mmol/L Final   Potassium 10/07/2020 4.1  3.5 - 5.2 mmol/L Final   Chloride 10/07/2020 102  96 - 106 mmol/L Final   CO2 10/07/2020 24  20 - 29 mmol/L Final   Calcium 10/07/2020 9.7  8.6 - 10.2 mg/dL Final   WBC 10/07/2020 5.0  3.4 - 10.8 x10E3/uL Final   RBC 10/07/2020 5.18  4.14 - 5.80 x10E6/uL Final   Hemoglobin 10/07/2020 14.5  13.0 - 17.7 g/dL Final   Hematocrit 10/07/2020 44.3  37.5 - 51.0 % Final   MCV 10/07/2020 86  79 - 97 fL Final   MCH 10/07/2020 28.0  26.6 - 33.0 pg Final   MCHC 10/07/2020 32.7  31.5 - 35.7 g/dL Final   RDW 10/07/2020 14.6  11.6 - 15.4 % Final   Platelets 10/07/2020 194  150 - 450 x10E3/uL Final    Blood Alcohol level:  Lab Results  Component Value Date   ETH 38 (H) 03/06/2021   ETH <10 40/11/2723    Metabolic Disorder Labs: Lab Results  Component Value Date   HGBA1C 6.8 (H) 12/10/2020   MPG 148.46 12/10/2020   MPG 188.64 10/10/2020  No results found for: PROLACTIN Lab Results  Component Value Date   CHOL 177 12/10/2020   TRIG 124 12/10/2020   HDL 50 12/10/2020   CHOLHDL 3.5 12/10/2020   VLDL 25 12/10/2020   LDLCALC 102 (H) 12/10/2020   LDLCALC 82 10/10/2020    Therapeutic Lab Levels: No results found for: LITHIUM Lab Results  Component Value Date   VALPROATE <10.0 (L) 11/03/2012   VALPROATE 80.7 03/27/2012   No components found for:  CBMZ  Physical Findings   AIMS    Flowsheet Row Admission (Discharged) from 01/06/2021 in [facility] Admission (Discharged) from 10/09/2016 in [facility]  AIMS Total Score 0 0      AUDIT    Flowsheet Row Admission (Discharged) from 01/06/2021 in [facility] 300B Admission (Discharged) from 12/08/2020 in [facility] 400B Admission (Discharged) from 10/09/2016 in [facility] 400B Admission (Discharged) from 07/23/2013 in [facility]B Admission (Discharged) from 04/02/2013 in [facility] 300B  Alcohol Use Disorder Identification Test Final Score (AUDIT) 13 5 0 4 0      PHQ2-9    Flowsheet Row ED from 01/04/2021 in [facility]  PHQ-2 Total Score 4  PHQ-9 Total Score 12      Flowsheet Row ED from 03/06/2021 in [facility] Most recent reading at 03/06/2021  9:58 PM ED from 03/06/2021 in [facility] Most recent reading at 03/06/2021  6:40 PM ED from 01/25/2021 in [facility] Most recent reading at 01/27/2021 10:18 AM  C-SSRS RISK CATEGORY High Risk High Risk High Risk        Musculoskeletal  Strength & Muscle Tone: within normal limits Gait & Station: normal Patient leans: N/A  Psychiatric Specialty Exam  Presentation  General Appearance: Fairly Groomed  Eye Contact:Good  Speech:Clear and Coherent; Normal Rate  Speech Volume:Normal  Handedness:Right   Mood and Affect  Mood:Depressed  Affect:Congruent   Thought Process  Thought Processes:Coherent  Descriptions of Associations:Intact  Orientation:Full (Time, Place and Person)  Thought Content:Logical  Diagnosis of Schizophrenia or Schizoaffective disorder in past: No  Duration of Psychotic Symptoms: Greater than six months   Hallucinations:Hallucinations: Auditory Description of Auditory Hallucinations: voices that tell me to kill myself  Ideas of Reference:Paranoia  Suicidal Thoughts:Suicidal Thoughts: Yes, Active SI Active Intent and/or Plan: With Intent; With Plan; With Means to Carry Out  Homicidal Thoughts:Homicidal Thoughts: Yes, Passive HI Passive Intent and/or Plan: Without Intent;  Without Plan; With Means to [facility]; Recent Good; Remote Good  Judgment:Fair  Insight:Fair   Executive Functions  Concentration:Good  Attention Span:Good  Recall:Good  Fund of Knowledge:Good  Language:Good   Psychomotor Activity  Psychomotor Activity:Psychomotor Activity: Normal   Assets  Assets:Communication Skills; Desire for Improvement; Financial Resources/Insurance; Leisure Time; Physical Health   Sleep  Sleep:Sleep: Fair Number of Hours of Sleep: 6   Nutritional Assessment (For OBS and FBC admissions only) Has the patient had a weight loss or gain of 10 pounds or more in the last 3 months?: Yes Has the patient had a decrease in food intake/or appetite?: Yes Does the patient have dental problems?: No Does the patient have eating habits or behaviors that may be indicators of an eating disorder including binging or inducing vomiting?: No Has the patient recently  lost weight without trying?: 1 Has the patient been eating poorly because of a decreased appetite?: 0 Malnutrition Screening Tool Score: 1    Physical Exam  Physical Exam Vitals and nursing note reviewed.  Constitutional:      General: He is not in acute distress.    Appearance: Normal appearance. He is well-developed.  HENT:     Head: Normocephalic and atraumatic.  Eyes:     General:        Right eye: No discharge.        Left eye: No discharge.     Conjunctiva/sclera: Conjunctivae normal.  Cardiovascular:     Rate and Rhythm: Normal rate.  Pulmonary:     Effort: Pulmonary effort is normal.  Musculoskeletal:        General: No swelling or tenderness. Normal range of motion.     Cervical back: Normal range of motion.  Skin:    Capillary Refill: Capillary refill takes less than 2 seconds.     Coloration: Skin is not jaundiced or pale.  Neurological:     Mental Status: He is alert and oriented to person, place, and time.  Psychiatric:         Attention and Perception: He perceives auditory hallucinations. He does not perceive visual hallucinations.        Mood and Affect: Mood is depressed.        Speech: Speech normal.        Behavior: Behavior is cooperative.        Thought Content: Thought content is paranoid. Thought content includes homicidal and suicidal ideation. Thought content includes suicidal plan. Thought content does not include homicidal plan.        Cognition and Memory: Cognition normal.        Judgment: Judgment is impulsive.   Review of Systems  Constitutional: Negative.   HENT: Negative.    Eyes: Negative.   Respiratory: Negative.    Cardiovascular: Negative.   Musculoskeletal: Negative.   Skin: Negative.   Neurological: Negative.   Psychiatric/Behavioral:  Positive for depression, hallucinations, substance abuse and suicidal ideas.   Blood pressure 138/79, pulse 86, temperature 98.2 F (36.8 C), temperature source Oral, resp. rate 18, SpO2 99 %. There is no height or weight on file to calculate BMI.  Treatment Plan Summary: Daily contact with patient to assess and evaluate symptoms and progress in treatment and Medication management  Disposition: patient meets criteria for inpatient psychiatric admission. SW notified patient has VA benefits and to seek placement.   Restarted home medications: Abilify 72m QD (started lower due to non compliance with originally prescribed 243m, Cymbalta 30 mg QD, Gabapentin 100 mg TID (started at lower dose due to non compliance he is prescribed 300 mg TID), Hydroxyzine 25 mg TID PRN, Metformin 500 mg 24 hr tab, Metoprolol succinate 24 hr 25 mg QD.  Furosemide 20 mg QD also prescribed and an be restarted if Patients BP is appropriate.  CaRevonda HumphreyNP 03/07/2021 8:55 AM

## 2021-03-07 NOTE — ED Notes (Signed)
Patient calm and cooperative. Patient cognizant of state of being aeb stating to nurse how he feels and level of symptoms. Patient safe on unit monitoring continues.

## 2021-03-07 NOTE — ED Notes (Signed)
Pt sleeping at present, no distress noted, resp even & unlabored.  Monitoring for safety.

## 2021-03-07 NOTE — Progress Notes (Signed)
CSW contacted [facility] and it was reported that they are currently on diversion; [place], spoke with Lynelle Smoke, advised to call back Monday; [facility] [place] is currently on diversion.   Glennie [place], MSW, [place], LCAS-A Phone: [phone] Disposition/TOC

## 2021-03-07 NOTE — ED Notes (Signed)
Breakfast provided.

## 2021-03-07 NOTE — ED Notes (Signed)
Pt requested for trazodone to help him sleep. 

## 2021-03-07 NOTE — ED Notes (Signed)
Pt is in the bed sleeping at present. Respirations are even and unlabored. No acute distress noted. Will continue to monitor for safety.

## 2021-03-07 NOTE — ED Notes (Signed)
Pt sleeping at present, no distress noted.  Monitoring for safety. 

## 2021-03-07 NOTE — ED Notes (Signed)
Patient engaging with another patient on unit. Patient calm and cooperative. Patient safe on unit monitoring continues.

## 2021-03-08 DIAGNOSIS — F314 Bipolar disorder, current episode depressed, severe, without psychotic features: Secondary | ICD-10-CM | POA: Diagnosis not present

## 2021-03-08 NOTE — ED Notes (Signed)
Pt sleeping at present, no distress noted.  Monitoring for safety. 

## 2021-03-08 NOTE — ED Notes (Signed)
Pt is sleeping in the bed. Respirations are even and unlabored. No acute distress noted. Will continue to monitor for safety.

## 2021-03-08 NOTE — ED Provider Notes (Signed)
Behavioral Health Progress Note  Date and Time: 03/08/2021 10:37 AM Name: [name] MRN:  [id number]  Subjective: [name] is a 66 year old male with psychiatric history of bipolar affective disorder, depression, alcohol abuse, cocaine abuse, and suicidal ideation who presented to the [facility]. Patient was transferred to [facility] for continuous assessment.   Patient seen and re-evaluated face-to-face by this provider, chart reviewed and case discussed with Dr. Lovette Cliche. On evaluation, patient is alert and oriented x4. His thought process is logical and speech is clear and coherent. His mood is depressed and affect is congruent.  Today, patient asked if there is an update on placement. He was advised that the [facility] is currently on diversion and social work is working on finding placement. He continues to endorse suicidal ideations with a plan to shoot himself in the head. He states that he does have access to a gun but he cannot tell me where the gun is. He denies HI. He denies visual hallucinations. He endorses auditory hallucinations telling him to kill himself. There is no evidence that he is currently responding to internal or external stimuli. He endorses depressive symptoms of crying spells, irritably, poor sleep, poor appetite and sadness. Patient states that he is compliant with taking his medications and denies medication side effects.   Diagnosis:  Final diagnoses:  Bipolar affective disorder, depressed, severe ([place])  Suicidal ideation  Cocaine substance abuse ([place])  Alcohol abuse    Total Time spent with patient: 15 minutes  Past Psychiatric History: Bipolar affective disorder, crack cocaine abuse, alcohol abuse, depression, substance induced mood disorder Past Medical History:  Past Medical History:  Diagnosis Date   Anxiety    Arthritis    Cancer ([place])    Cataract    Depression    Diabetes mellitus    GERD (gastroesophageal reflux disease)    H/O suicide attempt 1997  and 2011   jumped out of moving vehicle and attempted to jump into moving traffic   High cholesterol    Hypertension    Neuropathic pain    Osteoporosis     Past Surgical History:  Procedure Laterality Date   COLONOSCOPY     CORONARY STENT INTERVENTION N/A 10/10/2020   Procedure: CORONARY STENT INTERVENTION;  Surgeon: Jettie Booze, MD;  Location: [facility];  Service: Cardiovascular;  Laterality: N/A;   LEFT HEART CATH AND CORONARY ANGIOGRAPHY N/A 10/10/2020   Procedure: LEFT HEART CATH AND CORONARY ANGIOGRAPHY;  Surgeon: Jettie Booze, MD;  Location: [facility];  Service: Cardiovascular;  Laterality: N/A;   NEPHRECTOMY     due to cancer   RIGHT/LEFT HEART CATH AND CORONARY ANGIOGRAPHY N/A 10/09/2020   Procedure: RIGHT/LEFT HEART CATH AND CORONARY ANGIOGRAPHY;  Surgeon: Nelva Bush, MD;  Location: [facility];  Service: Cardiovascular;  Laterality: N/A;   Family History:  Family History  Problem Relation Age of Onset   Alcohol abuse Sister    Alcohol abuse Brother    Colon cancer Neg Hx    Esophageal cancer Neg Hx    Pancreatic cancer Neg Hx    Rectal cancer Neg Hx    Stomach cancer Neg Hx    Family Psychiatric  History: Sister hx of alcohol abuse, brother hx of alcohol abuse Social History:  Social History   Substance and Sexual Activity  Alcohol Use Yes   Alcohol/week: 3.0 standard drinks   Types: 3 Cans of beer per week   Comment: Pt has been clean for 8 months  Social History   Substance and Sexual Activity  Drug Use Yes   Types: "Crack" cocaine, Cocaine    Social History   Socioeconomic History   Marital status: Single    Spouse name: Not on file   Number of children: Not on file   Years of education: Not on file   Highest education level: Not on file  Occupational History   Not on file  Tobacco Use   Smoking status: Every Day    Packs/day: 0.50    Years: 20.00    Pack years: 10.00    Types: Cigarettes    Smokeless tobacco: Never  Vaping Use   Vaping Use: Never used  Substance and Sexual Activity   Alcohol use: Yes    Alcohol/week: 3.0 standard drinks    Types: 3 Cans of beer per week    Comment: Pt has been clean for 8 months   Drug use: Yes    Types: "Crack" cocaine, Cocaine   Sexual activity: Yes    Birth control/protection: Condom  Other Topics Concern   Not on file  Social History Narrative   Lives alone.  Has sister in area.   Social Determinants of Health   Financial Resource Strain: Not on file  Food Insecurity: Not on file  Transportation Needs: Not on file  Physical Activity: Not on file  Stress: Not on file  Social Connections: Not on file   SDOH:  SDOH Screenings   Alcohol Screen: Medium Risk   Last Alcohol Screening Score (AUDIT): 13  Depression (PHQ2-9): Medium Risk   PHQ-2 Score: 12  Financial Resource Strain: Not on file  Food Insecurity: Not on file  Housing: Not on file  Physical Activity: Not on file  Social Connections: Not on file  Stress: Not on file  Tobacco Use: High Risk   Smoking Tobacco Use: Every Day   Smokeless Tobacco Use: Never   Passive Exposure: Not on file  Transportation Needs: Not on file   Additional Social History:    Current Medications:  Current Facility-Administered Medications  Medication Dose Route Frequency Provider Last Rate Last Admin   acetaminophen (TYLENOL) tablet 650 mg  650 mg Oral Q6H PRN Ajibola, Ene A, NP       alum & mag hydroxide-simeth (MAALOX/MYLANTA) 200-200-20 MG/5ML suspension 30 mL  30 mL Oral Q4H PRN Ajibola, Ene A, NP       ARIPiprazole (ABILIFY) tablet 5 mg  5 mg Oral Daily Thomes Lolling H, NP   5 mg at 03/08/21 0902   DULoxetine (CYMBALTA) DR capsule 30 mg  30 mg Oral Daily Revonda Humphrey, NP   30 mg at 03/08/21 0902   gabapentin (NEURONTIN) capsule 100 mg  100 mg Oral TID Revonda Humphrey, NP   100 mg at 03/08/21 6599   hydrOXYzine (ATARAX) tablet 25 mg  25 mg Oral TID PRN Revonda Humphrey, NP       magnesium hydroxide (MILK OF MAGNESIA) suspension 30 mL  30 mL Oral Daily PRN Ajibola, Ene A, NP       metFORMIN (GLUCOPHAGE-XR) 24 hr tablet 500 mg  500 mg Oral Q breakfast Thomes Lolling H, NP   500 mg at 03/08/21 0819   metoprolol succinate (TOPROL-XL) 24 hr tablet 25 mg  25 mg Oral Daily Thomes Lolling H, NP   25 mg at 03/08/21 0902   traZODone (DESYREL) tablet 50 mg  50 mg Oral QHS PRN Ajibola, Ene A, NP   50 mg at  03/07/21 2120   Current Outpatient Medications  Medication Sig Dispense Refill   albuterol (VENTOLIN HFA) 108 (90 Base) MCG/ACT inhaler Inhale 1-2 puffs into the lungs every 6 (six) hours as needed for wheezing or shortness of breath. 1 each 0   ARIPiprazole (ABILIFY) 10 MG tablet Take 1 tablet (10 mg total) by mouth daily. Bipolar disorder 30 tablet 0   aspirin EC 81 MG EC tablet Take 1 tablet (81 mg total) by mouth daily. Blood vessel disease 30 tablet 0   clopidogrel (PLAVIX) 75 MG tablet Take 1 tablet (75 mg total) by mouth daily. Prevention of blood clots 30 tablet 0   DULoxetine (CYMBALTA) 30 MG capsule Take 1 capsule (30 mg total) by mouth daily. Depression 30 capsule 0   gabapentin (NEURONTIN) 300 MG capsule Take 1 capsule (300 mg total) by mouth 3 (three) times daily. Neuropathy 90 capsule 0   hydrOXYzine (ATARAX/VISTARIL) 25 MG tablet Take 1 tablet (25 mg total) by mouth 3 (three) times daily as needed for anxiety. anxiety (Patient taking differently: Take 25 mg by mouth 3 (three) times daily as needed for anxiety.) 30 tablet 0   metFORMIN (FORTAMET) 1000 MG (OSM) 24 hr tablet Take 1 tablet (1,000 mg total) by mouth daily with supper. Diabetes 30 tablet 0   metFORMIN (GLUCOPHAGE-XR) 500 MG 24 hr tablet Take 1 tablet (500 mg total) by mouth daily with breakfast. diabetes (Patient not taking: Reported on 01/25/2021) 30 tablet 0   metoprolol succinate (TOPROL-XL) 25 MG 24 hr tablet Take 1 tablet (25 mg total) by mouth daily. Elevated blood pressure 30  tablet 0   Multiple Vitamin (MULTIVITAMIN WITH MINERALS) TABS tablet Take 1 tablet by mouth daily as needed (for supplementation).     nicotine polacrilex (NICORETTE) 2 MG gum Take 1 each (2 mg total) by mouth as needed for smoking cessation. 100 tablet 0   pantoprazole (PROTONIX) 40 MG tablet Take 1 tablet (40 mg total) by mouth daily. Acid reflux disease 30 tablet 0   rosuvastatin (CRESTOR) 40 MG tablet Take 1 tablet (40 mg total) by mouth daily. Elevated cholesterol 30 tablet 0   traZODone (DESYREL) 50 MG tablet Take 50 mg by mouth at bedtime as needed for sleep.      Labs  Lab Results:  Admission on 03/06/2021, Discharged on 03/06/2021  Component Date Value Ref Range Status   WBC 03/06/2021 5.2  4.0 - 10.5 K/uL Final   RBC 03/06/2021 4.77  4.22 - 5.81 MIL/uL Final   Hemoglobin 03/06/2021 13.8  13.0 - 17.0 g/dL Final   HCT 03/06/2021 41.7  39.0 - 52.0 % Final   MCV 03/06/2021 87.4  80.0 - 100.0 fL Final   MCH 03/06/2021 28.9  26.0 - 34.0 pg Final   MCHC 03/06/2021 33.1  30.0 - 36.0 g/dL Final   RDW 03/06/2021 13.7  11.5 - 15.5 % Final   Platelets 03/06/2021 209  150 - 400 K/uL Final   nRBC 03/06/2021 0.0  0.0 - 0.2 % Final   Neutrophils Relative % 03/06/2021 58  % Final   Neutro Abs 03/06/2021 3.1  1.7 - 7.7 K/uL Final   Lymphocytes Relative 03/06/2021 28  % Final   Lymphs Abs 03/06/2021 1.5  0.7 - 4.0 K/uL Final   Monocytes Relative 03/06/2021 10  % Final   Monocytes Absolute 03/06/2021 0.5  0.1 - 1.0 K/uL Final   Eosinophils Relative 03/06/2021 2  % Final   Eosinophils Absolute 03/06/2021 0.1  0.0 - 0.5 K/uL  Final   Basophils Relative 03/06/2021 1  % Final   Basophils Absolute 03/06/2021 0.0  0.0 - 0.1 K/uL Final   Immature Granulocytes 03/06/2021 1  % Final   Abs Immature Granulocytes 03/06/2021 0.03  0.00 - 0.07 K/uL Final   Performed at [facility], [place] [address] [place], [place] [address]   Sodium 03/06/2021 136  135 - 145 mmol/L Final   Potassium 03/06/2021 3.6   3.5 - 5.1 mmol/L Final   Chloride 03/06/2021 98  98 - 111 mmol/L Final   CO2 03/06/2021 27  22 - 32 mmol/L Final   Glucose, Bld 03/06/2021 122 (H)  70 - 99 mg/dL Final   Glucose reference range applies only to samples taken after fasting for at least 8 hours.   BUN 03/06/2021 19  8 - 23 mg/dL Final   Creatinine, Ser 03/06/2021 1.01  0.61 - 1.24 mg/dL Final   Calcium 03/06/2021 9.2  8.9 - 10.3 mg/dL Final   Total Protein 03/06/2021 6.8  6.5 - 8.1 g/dL Final   Albumin 03/06/2021 3.8  3.5 - 5.0 g/dL Final   AST 03/06/2021 22  15 - 41 U/L Final   ALT 03/06/2021 21  0 - 44 U/L Final   Alkaline Phosphatase 03/06/2021 55  38 - 126 U/L Final   Total Bilirubin 03/06/2021 0.6  0.3 - 1.2 mg/dL Final   GFR, Estimated 03/06/2021 >60  >60 mL/min Final   Comment: (NOTE) Calculated using the CKD-EPI Creatinine Equation (2021)    Anion gap 03/06/2021 11  5 - 15 Final   Performed at [facility] [address] [place], [place] [address]   Alcohol, Ethyl (B) 03/06/2021 38 (H)  <10 mg/dL Final   Comment: (NOTE) Lowest detectable limit for serum alcohol is 10 mg/dL.  For medical purposes only. Performed at [facility], [place] [address], [place], [place] [address]    Opiates 03/06/2021 NONE DETECTED  NONE DETECTED Final   Cocaine 03/06/2021 POSITIVE (A)  NONE DETECTED Final   Benzodiazepines 03/06/2021 NONE DETECTED  NONE DETECTED Final   Amphetamines 03/06/2021 NONE DETECTED  NONE DETECTED Final   Tetrahydrocannabinol 03/06/2021 NONE DETECTED  NONE DETECTED Final   Barbiturates 03/06/2021 NONE DETECTED  NONE DETECTED Final   Comment: (NOTE) DRUG SCREEN FOR MEDICAL PURPOSES ONLY.  IF CONFIRMATION IS NEEDED FOR ANY PURPOSE, NOTIFY LAB WITHIN 5 DAYS.  LOWEST DETECTABLE LIMITS FOR URINE DRUG SCREEN Drug Class                     Cutoff (ng/mL) Amphetamine and metabolites    1000 Barbiturate and metabolites    200 Benzodiazepine                 010 Tricyclics and metabolites      300 Opiates and metabolites        300 Cocaine and metabolites        300 THC                            50 Performed at [facility], [place] [address], [place], [place] [address]    Salicylate Lvl 97/58/8325 <7.0 (L)  7.0 - 30.0 mg/dL Final   Performed at [facility] [address] [place], [place] [address]   Acetaminophen (Tylenol), Serum 03/06/2021 <10 (L)  10 - 30 ug/mL Final   Comment: (NOTE) Therapeutic concentrations vary significantly.  A range of 10-30 ug/mL  may be an effective concentration for many patients. However, some  are best treated at concentrations outside of this range. Acetaminophen concentrations >150 ug/mL at 4 hours after ingestion  and >50 ug/mL at 12 hours after ingestion are often associated with  toxic reactions.  Performed at [facility], [place] [address], [place], [place] [address]    SARS Coronavirus 2 by RT PCR 03/06/2021 NEGATIVE  NEGATIVE Final   Comment: (NOTE) SARS-CoV-2 target nucleic acids are NOT DETECTED.  The SARS-CoV-2 RNA is generally detectable in upper respiratory specimens during the acute phase of infection. The lowest concentration of SARS-CoV-2 viral copies this assay can detect is 138 copies/mL. A negative result does not preclude SARS-Cov-2 infection and should not be used as the sole basis for treatment or other patient management decisions. A negative result may occur with  improper specimen collection/handling, submission of specimen other than nasopharyngeal swab, presence of viral mutation(s) within the areas targeted by this assay, and inadequate number of viral copies(<138 copies/mL). A negative result must be combined with clinical observations, patient history, and epidemiological information. The expected result is Negative.  Fact Sheet for Patients:  EntrepreneurPulse.com.au  Fact Sheet for Healthcare Providers:  IncredibleEmployment.be  This test is no                           t yet approved or cleared by the [place] FDA and  has been authorized for detection and/or diagnosis of SARS-CoV-2 by FDA under an Emergency Use Authorization (EUA). This EUA will remain  in effect (meaning this test can be used) for the duration of the COVID-19 declaration under Section 564(b)(1) of the Act, 21 U.S.C.section 360bbb-3(b)(1), unless the authorization is terminated  or revoked sooner.       Influenza A by PCR 03/06/2021 NEGATIVE  NEGATIVE Final   Influenza B by PCR 03/06/2021 NEGATIVE  NEGATIVE Final   Comment: (NOTE) The Xpert Xpress SARS-CoV-2/FLU/RSV plus assay is intended as an aid in the diagnosis of influenza from Nasopharyngeal swab specimens and should not be used as a sole basis for treatment. Nasal washings and aspirates are unacceptable for Xpert Xpress SARS-CoV-2/FLU/RSV testing.  Fact Sheet for Patients: EntrepreneurPulse.com.au  Fact Sheet for Healthcare Providers: IncredibleEmployment.be  This test is not yet approved or cleared by the [place] FDA and has been authorized for detection and/or diagnosis of SARS-CoV-2 by FDA under an Emergency Use Authorization (EUA). This EUA will remain in effect (meaning this test can be used) for the duration of the COVID-19 declaration under Section 564(b)(1) of the Act, 21 U.S.C. section 360bbb-3(b)(1), unless the authorization is terminated or revoked.  Performed at [facility], [place] [address] [place], [place] [address]   Admission on 01/25/2021, Discharged on 01/28/2021  Component Date Value Ref Range Status   Sodium 01/25/2021 138  135 - 145 mmol/L Final   Potassium 01/25/2021 4.4  3.5 - 5.1 mmol/L Final   Chloride 01/25/2021 104  98 - 111 mmol/L Final   CO2 01/25/2021 27  22 - 32 mmol/L Final   Glucose, Bld 01/25/2021 152 (H)  70 - 99 mg/dL Final   Glucose reference range applies only to samples taken after fasting for at least 8  hours.   BUN 01/25/2021 13  8 - 23 mg/dL Final   Creatinine, Ser 01/25/2021 0.91  0.61 - 1.24 mg/dL Final   Calcium 01/25/2021 8.8 (L)  8.9 -  10.3 mg/dL Final   Total Protein 01/25/2021 6.2 (L)  6.5 - 8.1 g/dL Final   Albumin 01/25/2021 3.4 (L)  3.5 - 5.0 g/dL Final   AST 01/25/2021 24  15 - 41 U/L Final   ALT 01/25/2021 20  0 - 44 U/L Final   Alkaline Phosphatase 01/25/2021 58  38 - 126 U/L Final   Total Bilirubin 01/25/2021 0.6  0.3 - 1.2 mg/dL Final   GFR, Estimated 01/25/2021 >60  >60 mL/min Final   Comment: (NOTE) Calculated using the CKD-EPI Creatinine Equation (2021)    Anion gap 01/25/2021 7  5 - 15 Final   Performed at [facility], [place] [address], [place], [place] [address]   Alcohol, Ethyl (B) 01/25/2021 <10  <10 mg/dL Final   Comment: (NOTE) Lowest detectable limit for serum alcohol is 10 mg/dL.  For medical purposes only. Performed at [facility], [place] [address], [place], [place] [address]    Salicylate Lvl 29/93/7169 <7.0 (L)  7.0 - 30.0 mg/dL Final   Performed at [facility] [address], [place], [place] [address]   Acetaminophen (Tylenol), Serum 01/25/2021 <10 (L)  10 - 30 ug/mL Final   Comment: (NOTE) Therapeutic concentrations vary significantly. A range of 10-30 ug/mL  may be an effective concentration for many patients. However, some  are best treated at concentrations outside of this range. Acetaminophen concentrations >150 ug/mL at 4 hours after ingestion  and >50 ug/mL at 12 hours after ingestion are often associated with  toxic reactions.  Performed at [facility], [place] [address] [place], [place] [address]    WBC 01/25/2021 5.1  4.0 - 10.5 K/uL Final   RBC 01/25/2021 4.83  4.22 - 5.81 MIL/uL Final   Hemoglobin 01/25/2021 13.7  13.0 - 17.0 g/dL Final   HCT 01/25/2021 42.8  39.0 - 52.0 % Final   MCV 01/25/2021 88.6  80.0 - 100.0 fL Final   MCH 01/25/2021 28.4  26.0 - 34.0 pg Final   MCHC 01/25/2021 32.0  30.0 - 36.0  g/dL Final   RDW 01/25/2021 14.0  11.5 - 15.5 % Final   Platelets 01/25/2021 173  150 - 400 K/uL Final   nRBC 01/25/2021 0.0  0.0 - 0.2 % Final   Performed at [facility] [address], [place], [place] [address]   Opiates 01/25/2021 NONE DETECTED  NONE DETECTED Final   Cocaine 01/25/2021 POSITIVE (A)  NONE DETECTED Final   Benzodiazepines 01/25/2021 NONE DETECTED  NONE DETECTED Final   Amphetamines 01/25/2021 NONE DETECTED  NONE DETECTED Final   Tetrahydrocannabinol 01/25/2021 POSITIVE (A)  NONE DETECTED Final   Barbiturates 01/25/2021 NONE DETECTED  NONE DETECTED Final   Comment: (NOTE) DRUG SCREEN FOR MEDICAL PURPOSES ONLY.  IF CONFIRMATION IS NEEDED FOR ANY PURPOSE, NOTIFY LAB WITHIN 5 DAYS.  LOWEST DETECTABLE LIMITS FOR URINE DRUG SCREEN Drug Class                     Cutoff (ng/mL) Amphetamine and metabolites    1000 Barbiturate and metabolites    200 Benzodiazepine                 852 Tricyclics and metabolites     300 Opiates and metabolites        300 Cocaine and metabolites        300 THC  50 Performed at [facility], [place] [address], [place], [place] [address]    SARS Coronavirus 2 by RT PCR 01/25/2021 NEGATIVE  NEGATIVE Final   Comment: (NOTE) SARS-CoV-2 target nucleic acids are NOT DETECTED.  The SARS-CoV-2 RNA is generally detectable in upper respiratory specimens during the acute phase of infection. The lowest concentration of SARS-CoV-2 viral copies this assay can detect is 138 copies/mL. A negative result does not preclude SARS-Cov-2 infection and should not be used as the sole basis for treatment or other patient management decisions. A negative result may occur with  improper specimen collection/handling, submission of specimen other than nasopharyngeal swab, presence of viral mutation(s) within the areas targeted by this assay, and inadequate number of viral copies(<138 copies/mL). A negative result must be  combined with clinical observations, patient history, and epidemiological information. The expected result is Negative.  Fact Sheet for Patients:  EntrepreneurPulse.com.au  Fact Sheet for Healthcare Providers:  IncredibleEmployment.be  This test is no                          t yet approved or cleared by the [place] FDA and  has been authorized for detection and/or diagnosis of SARS-CoV-2 by FDA under an Emergency Use Authorization (EUA). This EUA will remain  in effect (meaning this test can be used) for the duration of the COVID-19 declaration under Section 564(b)(1) of the Act, 21 U.S.C.section 360bbb-3(b)(1), unless the authorization is terminated  or revoked sooner.       Influenza A by PCR 01/25/2021 NEGATIVE  NEGATIVE Final   Influenza B by PCR 01/25/2021 NEGATIVE  NEGATIVE Final   Comment: (NOTE) The Xpert Xpress SARS-CoV-2/FLU/RSV plus assay is intended as an aid in the diagnosis of influenza from Nasopharyngeal swab specimens and should not be used as a sole basis for treatment. Nasal washings and aspirates are unacceptable for Xpert Xpress SARS-CoV-2/FLU/RSV testing.  Fact Sheet for Patients: EntrepreneurPulse.com.au  Fact Sheet for Healthcare Providers: IncredibleEmployment.be  This test is not yet approved or cleared by the [place] FDA and has been authorized for detection and/or diagnosis of SARS-CoV-2 by FDA under an Emergency Use Authorization (EUA). This EUA will remain in effect (meaning this test can be used) for the duration of the COVID-19 declaration under Section 564(b)(1) of the Act, 21 U.S.C. section 360bbb-3(b)(1), unless the authorization is terminated or revoked.  Performed at [facility], [place] [address], [place], [place] [address]    Glucose-Capillary 01/25/2021 169 (H)  70 - 99 mg/dL Final   Glucose reference range applies only to samples taken after  fasting for at least 8 hours.  Admission on 01/06/2021, Discharged on 01/12/2021  Component Date Value Ref Range Status   Glucose-Capillary 01/07/2021 180 (H)  70 - 99 mg/dL Final   Glucose reference range applies only to samples taken after fasting for at least 8 hours.   Sodium 01/07/2021 137  135 - 145 mmol/L Final   Potassium 01/07/2021 4.3  3.5 - 5.1 mmol/L Final   Chloride 01/07/2021 105  98 - 111 mmol/L Final   CO2 01/07/2021 28  22 - 32 mmol/L Final   Glucose, Bld 01/07/2021 178 (H)  70 - 99 mg/dL Final   Glucose reference range applies only to samples taken after fasting for at least 8 hours.   BUN 01/07/2021 15  8 - 23 mg/dL Final   Creatinine, Ser 01/07/2021 0.87  0.61 - 1.24 mg/dL Final  Calcium 01/07/2021 8.9  8.9 - 10.3 mg/dL Final   GFR, Estimated 01/07/2021 >60  >60 mL/min Final   Comment: (NOTE) Calculated using the CKD-EPI Creatinine Equation (2021)    Anion gap 01/07/2021 4 (L)  5 - 15 Final   Performed at [facility], [place] [address] [place] [address], [place], [place] [address]   Glucose-Capillary 01/08/2021 131 (H)  70 - 99 mg/dL Final   Glucose reference range applies only to samples taken after fasting for at least 8 hours.   Comment 1 01/08/2021 Notify RN   Final   Glucose-Capillary 01/09/2021 135 (H)  70 - 99 mg/dL Final   Glucose reference range applies only to samples taken after fasting for at least 8 hours.   Glucose-Capillary 01/10/2021 158 (H)  70 - 99 mg/dL Final   Glucose reference range applies only to samples taken after fasting for at least 8 hours.   Glucose-Capillary 01/11/2021 148 (H)  70 - 99 mg/dL Final   Glucose reference range applies only to samples taken after fasting for at least 8 hours.   Comment 1 01/11/2021 Notify RN   Final   Comment 2 01/11/2021 Document in Chart   Final   Glucose-Capillary 01/12/2021 126 (H)  70 - 99 mg/dL Final   Glucose reference range applies only to samples taken after fasting for at least 8 hours.   Comment  1 01/12/2021 Notify RN   Final   Comment 2 01/12/2021 Document in Chart   Final  Admission on 01/05/2021, Discharged on 01/06/2021  Component Date Value Ref Range Status   Glucose-Capillary 01/06/2021 123 (H)  70 - 99 mg/dL Final   Glucose reference range applies only to samples taken after fasting for at least 8 hours.   SARSCOV2ONAVIRUS 2 AG 01/06/2021 NEGATIVE  NEGATIVE Final   Comment: (NOTE) SARS-CoV-2 antigen NOT DETECTED.   Negative results are presumptive.  Negative results do not preclude SARS-CoV-2 infection and should not be used as the sole basis for treatment or other patient management decisions, including infection  control decisions, particularly in the presence of clinical signs and  symptoms consistent with COVID-19, or in those who have been in contact with the virus.  Negative results must be combined with clinical observations, patient history, and epidemiological information. The expected result is Negative.  Fact Sheet for Patients: HandmadeRecipes.com.cy  Fact Sheet for Healthcare Providers: FuneralLife.at  This test is not yet approved or cleared by the [place] FDA and  has been authorized for detection and/or diagnosis of SARS-CoV-2 by FDA under an Emergency Use Authorization (EUA).  This EUA will remain in effect (meaning this test can be used) for the duration of  the COV                          ID-19 declaration under Section 564(b)(1) of the Act, 21 U.S.C. section 360bbb-3(b)(1), unless the authorization is terminated or revoked sooner.    Admission on 01/04/2021, Discharged on 01/05/2021  Component Date Value Ref Range Status   Sodium 01/04/2021 134 (L)  135 - 145 mmol/L Final   Potassium 01/04/2021 3.7  3.5 - 5.1 mmol/L Final   Chloride 01/04/2021 104  98 - 111 mmol/L Final   CO2 01/04/2021 25  22 - 32 mmol/L Final   Glucose, Bld 01/04/2021 145 (H)  70 - 99 mg/dL Final   Glucose reference  range applies only to samples taken after fasting for at least 8 hours.  BUN 01/04/2021 13  8 - 23 mg/dL Final   Creatinine, Ser 01/04/2021 0.95  0.61 - 1.24 mg/dL Final   Calcium 01/04/2021 8.9  8.9 - 10.3 mg/dL Final   Total Protein 01/04/2021 6.7  6.5 - 8.1 g/dL Final   Albumin 01/04/2021 3.7  3.5 - 5.0 g/dL Final   AST 01/04/2021 25  15 - 41 U/L Final   ALT 01/04/2021 23  0 - 44 U/L Final   Alkaline Phosphatase 01/04/2021 64  38 - 126 U/L Final   Total Bilirubin 01/04/2021 0.5  0.3 - 1.2 mg/dL Final   GFR, Estimated 01/04/2021 >60  >60 mL/min Final   Comment: (NOTE) Calculated using the CKD-EPI Creatinine Equation (2021)    Anion gap 01/04/2021 5  5 - 15 Final   Performed at [facility] [address], [place], [place] [address]   Alcohol, Ethyl (B) 01/04/2021 <10  <10 mg/dL Final   Comment: (NOTE) Lowest detectable limit for serum alcohol is 10 mg/dL.  For medical purposes only. Performed at [facility], [place] [address], [place], [place] [address]    Salicylate Lvl 31/51/7616 <7.0 (L)  7.0 - 30.0 mg/dL Final   Performed at [facility] [address] [place], [place] [address]   Acetaminophen (Tylenol), Serum 01/04/2021 <10 (L)  10 - 30 ug/mL Final   Comment: (NOTE) Therapeutic concentrations vary significantly. A range of 10-30 ug/mL  may be an effective concentration for many patients. However, some  are best treated at concentrations outside of this range. Acetaminophen concentrations >150 ug/mL at 4 hours after ingestion  and >50 ug/mL at 12 hours after ingestion are often associated with  toxic reactions.  Performed at [facility], [place] [address], [place], [place] [address]    WBC 01/04/2021 4.6  4.0 - 10.5 K/uL Final   RBC 01/04/2021 4.90  4.22 - 5.81 MIL/uL Final   Hemoglobin 01/04/2021 14.0  13.0 - 17.0 g/dL Final   HCT 01/04/2021 42.3  39.0 - 52.0 % Final   MCV 01/04/2021 86.3  80.0 - 100.0 fL Final   MCH 01/04/2021 28.6  26.0 -  34.0 pg Final   MCHC 01/04/2021 33.1  30.0 - 36.0 g/dL Final   RDW 01/04/2021 14.4  11.5 - 15.5 % Final   Platelets 01/04/2021 204  150 - 400 K/uL Final   nRBC 01/04/2021 0.0  0.0 - 0.2 % Final   Performed at [facility] [address] [place], [place] [address]   Opiates 01/04/2021 NONE DETECTED  NONE DETECTED Final   Cocaine 01/04/2021 POSITIVE (A)  NONE DETECTED Final   Benzodiazepines 01/04/2021 NONE DETECTED  NONE DETECTED Final   Amphetamines 01/04/2021 NONE DETECTED  NONE DETECTED Final   Tetrahydrocannabinol 01/04/2021 POSITIVE (A)  NONE DETECTED Final   Barbiturates 01/04/2021 NONE DETECTED  NONE DETECTED Final   Comment: (NOTE) DRUG SCREEN FOR MEDICAL PURPOSES ONLY.  IF CONFIRMATION IS NEEDED FOR ANY PURPOSE, NOTIFY LAB WITHIN 5 DAYS.  LOWEST DETECTABLE LIMITS FOR URINE DRUG SCREEN Drug Class                     Cutoff (ng/mL) Amphetamine and metabolites    1000 Barbiturate and metabolites    200 Benzodiazepine                 270 Tricyclics and metabolites     300 Opiates and metabolites        300  Cocaine and metabolites        300 THC                            50 Performed at [facility], [place] [address] [place], [place] [address]    SARS Coronavirus 2 by RT PCR 01/04/2021 NEGATIVE  NEGATIVE Final   Comment: (NOTE) SARS-CoV-2 target nucleic acids are NOT DETECTED.  The SARS-CoV-2 RNA is generally detectable in upper respiratory specimens during the acute phase of infection. The lowest concentration of SARS-CoV-2 viral copies this assay can detect is 138 copies/mL. A negative result does not preclude SARS-Cov-2 infection and should not be used as the sole basis for treatment or other patient management decisions. A negative result may occur with  improper specimen collection/handling, submission of specimen other than nasopharyngeal swab, presence of viral mutation(s) within the areas targeted by this assay, and inadequate number of  viral copies(<138 copies/mL). A negative result must be combined with clinical observations, patient history, and epidemiological information. The expected result is Negative.  Fact Sheet for Patients:  EntrepreneurPulse.com.au  Fact Sheet for Healthcare Providers:  IncredibleEmployment.be  This test is no                          t yet approved or cleared by the [place] FDA and  has been authorized for detection and/or diagnosis of SARS-CoV-2 by FDA under an Emergency Use Authorization (EUA). This EUA will remain  in effect (meaning this test can be used) for the duration of the COVID-19 declaration under Section 564(b)(1) of the Act, 21 U.S.C.section 360bbb-3(b)(1), unless the authorization is terminated  or revoked sooner.       Influenza A by PCR 01/04/2021 NEGATIVE  NEGATIVE Final   Influenza B by PCR 01/04/2021 NEGATIVE  NEGATIVE Final   Comment: (NOTE) The Xpert Xpress SARS-CoV-2/FLU/RSV plus assay is intended as an aid in the diagnosis of influenza from Nasopharyngeal swab specimens and should not be used as a sole basis for treatment. Nasal washings and aspirates are unacceptable for Xpert Xpress SARS-CoV-2/FLU/RSV testing.  Fact Sheet for Patients: EntrepreneurPulse.com.au  Fact Sheet for Healthcare Providers: IncredibleEmployment.be  This test is not yet approved or cleared by the [place] FDA and has been authorized for detection and/or diagnosis of SARS-CoV-2 by FDA under an Emergency Use Authorization (EUA). This EUA will remain in effect (meaning this test can be used) for the duration of the COVID-19 declaration under Section 564(b)(1) of the Act, 21 U.S.C. section 360bbb-3(b)(1), unless the authorization is terminated or revoked.  Performed at [facility], [place] [address] [place], [place] [address]    Glucose-Capillary 01/04/2021 150 (H)  70 - 99 mg/dL Final    Glucose reference range applies only to samples taken after fasting for at least 8 hours.  Admission on 12/08/2020, Discharged on 12/16/2020  Component Date Value Ref Range Status   Glucose-Capillary 12/09/2020 172 (H)  70 - 99 mg/dL Final   Glucose reference range applies only to samples taken after fasting for at least 8 hours.   TSH 12/10/2020 0.977  0.350 - 4.500 uIU/mL Final   Comment: Performed by a 3rd Generation assay with a functional sensitivity of <=0.01 uIU/mL. Performed at [facility], [place] [address], [place], [place] [address]    Hgb A1c MFr Bld 12/10/2020 6.8 (H)  4.8 - 5.6 % Final   Comment: (  NOTE) Pre diabetes:          5.7%-6.4%  Diabetes:              >6.4%  Glycemic control for   <7.0% adults with diabetes    Mean Plasma Glucose 12/10/2020 148.46  mg/dL Final   Performed at [facility], [place] [address] [place], [place] [address]   Cholesterol 12/10/2020 177  0 - 200 mg/dL Final   Triglycerides 12/10/2020 124  <150 mg/dL Final   HDL 12/10/2020 50  >40 mg/dL Final   Total CHOL/HDL Ratio 12/10/2020 3.5  RATIO Final   VLDL 12/10/2020 25  0 - 40 mg/dL Final   LDL Cholesterol 12/10/2020 102 (H)  0 - 99 mg/dL Final   Comment:        Total Cholesterol/HDL:CHD Risk Coronary Heart Disease Risk Table                     Men   Women  1/2 Average Risk   3.4   3.3  Average Risk       5.0   4.4  2 X Average Risk   9.6   7.1  3 X Average Risk  23.4   11.0        Use the calculated Patient Ratio above and the CHD Risk Table to determine the patient's CHD Risk.        ATP III CLASSIFICATION (LDL):  <100     mg/dL   Optimal  100-129  mg/dL   Near or Above                    Optimal  130-159  mg/dL   Borderline  160-189  mg/dL   High  >190     mg/dL   Very High Performed at [facility] [address], [place],  [address]    Glucose-Capillary 12/09/2020 191 (H)  70 - 99 mg/dL Final   Glucose reference range applies  only to samples taken after fasting for at least 8 hours.   Glucose-Capillary 12/10/2020 118 (H)  70 - 99 mg/dL Final   Glucose reference range applies only to samples taken after fasting for at least 8 hours.   Glucose-Capillary 12/10/2020 136 (H)  70 - 99 mg/dL Final   Glucose reference range applies only to samples taken after fasting for at least 8 hours.   Glucose-Capillary 12/10/2020 125 (H)  70 - 99 mg/dL Final   Glucose reference range applies only to samples taken after fasting for at least 8 hours.   Glucose-Capillary 12/11/2020 96  70 - 99 mg/dL Final   Glucose reference range applies only to samples taken after fasting for at least 8 hours.   Glucose-Capillary 12/11/2020 92  70 - 99 mg/dL Final   Glucose reference range applies only to samples taken after fasting for at least 8 hours.   SARS Coronavirus 2 by RT PCR 12/11/2020 NEGATIVE  NEGATIVE Final   Comment: (NOTE) SARS-CoV-2 target nucleic acids are NOT DETECTED.  The SARS-CoV-2 RNA is generally detectable in upper respiratory specimens during the acute phase of infection. The lowest concentration of SARS-CoV-2 viral copies this assay can detect is 138 copies/mL. A negative result does not preclude SARS-Cov-2 infection and should not be used as the sole basis for treatment or other patient management decisions. A negative result may occur with  improper specimen collection/handling, submission of specimen other than nasopharyngeal swab, presence of viral mutation(s)  within the areas targeted by this assay, and inadequate number of viral copies(<138 copies/mL). A negative result must be combined with clinical observations, patient history, and epidemiological information. The expected result is Negative.  Fact Sheet for Patients:  EntrepreneurPulse.com.au  Fact Sheet for Healthcare Providers:  IncredibleEmployment.be  This test is no                          t yet approved or  cleared by the [place] FDA and  has been authorized for detection and/or diagnosis of SARS-CoV-2 by FDA under an Emergency Use Authorization (EUA). This EUA will remain  in effect (meaning this test can be used) for the duration of the COVID-19 declaration under Section 564(b)(1) of the Act, 21 U.S.C.section 360bbb-3(b)(1), unless the authorization is terminated  or revoked sooner.       Influenza A by PCR 12/11/2020 NEGATIVE  NEGATIVE Final   Influenza B by PCR 12/11/2020 NEGATIVE  NEGATIVE Final   Comment: (NOTE) The Xpert Xpress SARS-CoV-2/FLU/RSV plus assay is intended as an aid in the diagnosis of influenza from Nasopharyngeal swab specimens and should not be used as a sole basis for treatment. Nasal washings and aspirates are unacceptable for Xpert Xpress SARS-CoV-2/FLU/RSV testing.  Fact Sheet for Patients: EntrepreneurPulse.com.au  Fact Sheet for Healthcare Providers: IncredibleEmployment.be  This test is not yet approved or cleared by the [place] FDA and has been authorized for detection and/or diagnosis of SARS-CoV-2 by FDA under an Emergency Use Authorization (EUA). This EUA will remain in effect (meaning this test can be used) for the duration of the COVID-19 declaration under Section 564(b)(1) of the Act, 21 U.S.C. section 360bbb-3(b)(1), unless the authorization is terminated or revoked.  Performed at [facility], [place] [address], [place], [place] [address]    Glucose-Capillary 12/11/2020 107 (H)  70 - 99 mg/dL Final   Glucose reference range applies only to samples taken after fasting for at least 8 hours.   Glucose-Capillary 12/12/2020 114 (H)  70 - 99 mg/dL Final   Glucose reference range applies only to samples taken after fasting for at least 8 hours.   Comment 1 12/12/2020 Notify RN   Final   Glucose-Capillary 12/12/2020 149 (H)  70 - 99 mg/dL Final   Glucose reference range applies only to  samples taken after fasting for at least 8 hours.   Glucose-Capillary 12/12/2020 138 (H)  70 - 99 mg/dL Final   Glucose reference range applies only to samples taken after fasting for at least 8 hours.   Glucose-Capillary 12/13/2020 114 (H)  70 - 99 mg/dL Final   Glucose reference range applies only to samples taken after fasting for at least 8 hours.   Glucose-Capillary 12/13/2020 107 (H)  70 - 99 mg/dL Final   Glucose reference range applies only to samples taken after fasting for at least 8 hours.   Glucose-Capillary 12/14/2020 139 (H)  70 - 99 mg/dL Final   Glucose reference range applies only to samples taken after fasting for at least 8 hours.   Glucose-Capillary 12/14/2020 105 (H)  70 - 99 mg/dL Final   Glucose reference range applies only to samples taken after fasting for at least 8 hours.   Glucose-Capillary 12/15/2020 121 (H)  70 - 99 mg/dL Final   Glucose reference range applies only to samples taken after fasting for at least 8 hours.   Glucose-Capillary 12/16/2020 182 (H)  70 - 99 mg/dL Final  Glucose reference range applies only to samples taken after fasting for at least 8 hours.   Comment 1 12/16/2020 Notify RN   Final   Glucose-Capillary 12/15/2020 172 (H)  70 - 99 mg/dL Final   Glucose reference range applies only to samples taken after fasting for at least 8 hours.   Comment 1 12/15/2020 Notify RN   Final  Admission on 12/05/2020, Discharged on 12/08/2020  Component Date Value Ref Range Status   SARS Coronavirus 2 by RT PCR 12/05/2020 NEGATIVE  NEGATIVE Final   Comment: (NOTE) SARS-CoV-2 target nucleic acids are NOT DETECTED.  The SARS-CoV-2 RNA is generally detectable in upper respiratory specimens during the acute phase of infection. The lowest concentration of SARS-CoV-2 viral copies this assay can detect is 138 copies/mL. A negative result does not preclude SARS-Cov-2 infection and should not be used as the sole basis for treatment or other patient management  decisions. A negative result may occur with  improper specimen collection/handling, submission of specimen other than nasopharyngeal swab, presence of viral mutation(s) within the areas targeted by this assay, and inadequate number of viral copies(<138 copies/mL). A negative result must be combined with clinical observations, patient history, and epidemiological information. The expected result is Negative.  Fact Sheet for Patients:  EntrepreneurPulse.com.au  Fact Sheet for Healthcare Providers:  IncredibleEmployment.be  This test is no                          t yet approved or cleared by the [place] FDA and  has been authorized for detection and/or diagnosis of SARS-CoV-2 by FDA under an Emergency Use Authorization (EUA). This EUA will remain  in effect (meaning this test can be used) for the duration of the COVID-19 declaration under Section 564(b)(1) of the Act, 21 U.S.C.section 360bbb-3(b)(1), unless the authorization is terminated  or revoked sooner.       Influenza A by PCR 12/05/2020 NEGATIVE  NEGATIVE Final   Influenza B by PCR 12/05/2020 NEGATIVE  NEGATIVE Final   Comment: (NOTE) The Xpert Xpress SARS-CoV-2/FLU/RSV plus assay is intended as an aid in the diagnosis of influenza from Nasopharyngeal swab specimens and should not be used as a sole basis for treatment. Nasal washings and aspirates are unacceptable for Xpert Xpress SARS-CoV-2/FLU/RSV testing.  Fact Sheet for Patients: EntrepreneurPulse.com.au  Fact Sheet for Healthcare Providers: IncredibleEmployment.be  This test is not yet approved or cleared by the [place] FDA and has been authorized for detection and/or diagnosis of SARS-CoV-2 by FDA under an Emergency Use Authorization (EUA). This EUA will remain in effect (meaning this test can be used) for the duration of the COVID-19 declaration under Section 564(b)(1) of the Act,  21 U.S.C. section 360bbb-3(b)(1), unless the authorization is terminated or revoked.  Performed at [facility], [place] [address] [place], [place] [address]    Sodium 12/05/2020 135  135 - 145 mmol/L Final   Potassium 12/05/2020 3.9  3.5 - 5.1 mmol/L Final   Chloride 12/05/2020 105  98 - 111 mmol/L Final   CO2 12/05/2020 24  22 - 32 mmol/L Final   Glucose, Bld 12/05/2020 222 (H)  70 - 99 mg/dL Final   Glucose reference range applies only to samples taken after fasting for at least 8 hours.   BUN 12/05/2020 11  8 - 23 mg/dL Final   Creatinine, Ser 12/05/2020 1.01  0.61 - 1.24 mg/dL Final   Calcium 12/05/2020 9.0  8.9 - 10.3 mg/dL  Final   Total Protein 12/05/2020 6.9  6.5 - 8.1 g/dL Final   Albumin 12/05/2020 3.7  3.5 - 5.0 g/dL Final   AST 12/05/2020 18  15 - 41 U/L Final   ALT 12/05/2020 17  0 - 44 U/L Final   Alkaline Phosphatase 12/05/2020 50  38 - 126 U/L Final   Total Bilirubin 12/05/2020 0.7  0.3 - 1.2 mg/dL Final   GFR, Estimated 12/05/2020 >60  >60 mL/min Final   Comment: (NOTE) Calculated using the CKD-EPI Creatinine Equation (2021)    Anion gap 12/05/2020 6  5 - 15 Final   Performed at [facility], [place] [address], [place], [place] [address]   Alcohol, Ethyl (B) 12/05/2020 <10  <10 mg/dL Final   Comment: (NOTE) Lowest detectable limit for serum alcohol is 10 mg/dL.  For medical purposes only. Performed at [facility], [place] [address], [place], [place] [address]    Opiates 12/05/2020 NONE DETECTED  NONE DETECTED Final   Cocaine 12/05/2020 POSITIVE (A)  NONE DETECTED Final   Benzodiazepines 12/05/2020 NONE DETECTED  NONE DETECTED Final   Amphetamines 12/05/2020 NONE DETECTED  NONE DETECTED Final   Tetrahydrocannabinol 12/05/2020 NONE DETECTED  NONE DETECTED Final   Barbiturates 12/05/2020 NONE DETECTED  NONE DETECTED Final   Comment: (NOTE) DRUG SCREEN FOR MEDICAL PURPOSES ONLY.  IF CONFIRMATION IS NEEDED FOR  ANY PURPOSE, NOTIFY LAB WITHIN 5 DAYS.  LOWEST DETECTABLE LIMITS FOR URINE DRUG SCREEN Drug Class                     Cutoff (ng/mL) Amphetamine and metabolites    1000 Barbiturate and metabolites    200 Benzodiazepine                 007 Tricyclics and metabolites     300 Opiates and metabolites        300 Cocaine and metabolites        300 THC                            50 Performed at [facility], [place] [address] [place], [place] [address]    WBC 12/05/2020 4.6  4.0 - 10.5 K/uL Final   RBC 12/05/2020 5.17  4.22 - 5.81 MIL/uL Final   Hemoglobin 12/05/2020 14.8  13.0 - 17.0 g/dL Final   HCT 12/05/2020 44.3  39.0 - 52.0 % Final   MCV 12/05/2020 85.7  80.0 - 100.0 fL Final   MCH 12/05/2020 28.6  26.0 - 34.0 pg Final   MCHC 12/05/2020 33.4  30.0 - 36.0 g/dL Final   RDW 12/05/2020 13.9  11.5 - 15.5 % Final   Platelets 12/05/2020 212  150 - 400 K/uL Final   nRBC 12/05/2020 0.0  0.0 - 0.2 % Final   Neutrophils Relative % 12/05/2020 71  % Final   Neutro Abs 12/05/2020 3.3  1.7 - 7.7 K/uL Final   Lymphocytes Relative 12/05/2020 21  % Final   Lymphs Abs 12/05/2020 1.0  0.7 - 4.0 K/uL Final   Monocytes Relative 12/05/2020 7  % Final   Monocytes Absolute 12/05/2020 0.3  0.1 - 1.0 K/uL Final   Eosinophils Relative 12/05/2020 1  % Final   Eosinophils Absolute 12/05/2020 0.1  0.0 - 0.5 K/uL Final   Basophils Relative 12/05/2020 0  % Final   Basophils Absolute 12/05/2020 0.0  0.0 - 0.1 K/uL Final  Immature Granulocytes 12/05/2020 0  % Final   Abs Immature Granulocytes 12/05/2020 0.01  0.00 - 0.07 K/uL Final   Performed at [facility], [place] [address] [place], [place] [address]   Acetaminophen (Tylenol), Serum 12/05/2020 <10 (L)  10 - 30 ug/mL Final   Comment: (NOTE) Therapeutic concentrations vary significantly. A range of 10-30 ug/mL  may be an effective concentration for many patients. However, some  are best treated at concentrations outside of  this range. Acetaminophen concentrations >150 ug/mL at 4 hours after ingestion  and >50 ug/mL at 12 hours after ingestion are often associated with  toxic reactions.  Performed at [facility], [place] [address], [place], [place] [address]    Salicylate Lvl 68/01/7516 <7.0 (L)  7.0 - 30.0 mg/dL Final   Performed at [place] [address], [place], [place] [address]   Glucose-Capillary 12/05/2020 191 (H)  70 - 99 mg/dL Final   Glucose reference range applies only to samples taken after fasting for at least 8 hours.   Glucose-Capillary 12/08/2020 107 (H)  70 - 99 mg/dL Final   Glucose reference range applies only to samples taken after fasting for at least 8 hours.   SARS Coronavirus 2 by RT PCR 12/08/2020 NEGATIVE  NEGATIVE Final   Comment: (NOTE) SARS-CoV-2 target nucleic acids are NOT DETECTED.  The SARS-CoV-2 RNA is generally detectable in upper respiratory specimens during the acute phase of infection. The lowest concentration of SARS-CoV-2 viral copies this assay can detect is 138 copies/mL. A negative result does not preclude SARS-Cov-2 infection and should not be used as the sole basis for treatment or other patient management decisions. A negative result may occur with  improper specimen collection/handling, submission of specimen other than nasopharyngeal swab, presence of viral mutation(s) within the areas targeted by this assay, and inadequate number of viral copies(<138 copies/mL). A negative result must be combined with clinical observations, patient history, and epidemiological information. The expected result is Negative.  Fact Sheet for Patients:  EntrepreneurPulse.com.au  Fact Sheet for Healthcare Providers:  IncredibleEmployment.be  This test is no                          t yet approved or cleared by the [place] FDA and  has been authorized for detection and/or diagnosis of  SARS-CoV-2 by FDA under an Emergency Use Authorization (EUA). This EUA will remain  in effect (meaning this test can be used) for the duration of the COVID-19 declaration under Section 564(b)(1) of the Act, 21 U.S.C.section 360bbb-3(b)(1), unless the authorization is terminated  or revoked sooner.       Influenza A by PCR 12/08/2020 NEGATIVE  NEGATIVE Final   Influenza B by PCR 12/08/2020 NEGATIVE  NEGATIVE Final   Comment: (NOTE) The Xpert Xpress SARS-CoV-2/FLU/RSV plus assay is intended as an aid in the diagnosis of influenza from Nasopharyngeal swab specimens and should not be used as a sole basis for treatment. Nasal washings and aspirates are unacceptable for Xpert Xpress SARS-CoV-2/FLU/RSV testing.  Fact Sheet for Patients: EntrepreneurPulse.com.au  Fact Sheet for Healthcare Providers: IncredibleEmployment.be  This test is not yet approved or cleared by the [place] FDA and has been authorized for detection and/or diagnosis of SARS-CoV-2 by FDA under an Emergency Use Authorization (EUA). This EUA will remain in effect (meaning this test can be used) for the duration of the COVID-19 declaration under Section 564(b)(1) of the Act, 21 U.S.C.  section 360bbb-3(b)(1), unless the authorization is terminated or revoked.  Performed at [facility], [place] [address], [place], [place] [address]   Admission on 10/09/2020, Discharged on 10/11/2020  Component Date Value Ref Range Status   Glucose-Capillary 10/09/2020 153 (H)  70 - 99 mg/dL Final   Glucose reference range applies only to samples taken after fasting for at least 8 hours.   pH, Ven 10/09/2020 7.381  7.250 - 7.430 Final   pCO2, Ven 10/09/2020 42.8 (L)  44.0 - 60.0 mmHg Final   pO2, Ven 10/09/2020 37.0  32.0 - 45.0 mmHg Final   Bicarbonate 10/09/2020 25.4  20.0 - 28.0 mmol/L Final   TCO2 10/09/2020 27  22 - 32 mmol/L Final   O2 Saturation 10/09/2020 70.0  % Final    Acid-Base Excess 10/09/2020 0.0  0.0 - 2.0 mmol/L Final   Sodium 10/09/2020 143  135 - 145 mmol/L Final   Potassium 10/09/2020 3.8  3.5 - 5.1 mmol/L Final   Calcium, Ion 10/09/2020 1.26  1.15 - 1.40 mmol/L Final   HCT 10/09/2020 41.0  39.0 - 52.0 % Final   Hemoglobin 10/09/2020 13.9  13.0 - 17.0 g/dL Final   Sample type 10/09/2020 MIXED VENOUS SAMPLE   Final   Glucose-Capillary 10/09/2020 127 (H)  70 - 99 mg/dL Final   Glucose reference range applies only to samples taken after fasting for at least 8 hours.   SARS Coronavirus 2 10/09/2020 NEGATIVE  NEGATIVE Final   Comment: (NOTE) SARS-CoV-2 target nucleic acids are NOT DETECTED.  The SARS-CoV-2 RNA is generally detectable in upper and lower respiratory specimens during the acute phase of infection. Negative results do not preclude SARS-CoV-2 infection, do not rule out co-infections with other pathogens, and should not be used as the sole basis for treatment or other patient management decisions. Negative results must be combined with clinical observations, patient history, and epidemiological information. The expected result is Negative.  Fact Sheet for Patients: SugarRoll.be  Fact Sheet for Healthcare Providers: https://www.woods-mathews.com/  This test is not yet approved or cleared by the [place] FDA and  has been authorized for detection and/or diagnosis of SARS-CoV-2 by FDA under an Emergency Use Authorization (EUA). This EUA will remain  in effect (meaning this test can be used) for the duration of the COVID-19 declaration under Se                          ction 564(b)(1) of the Act, 21 U.S.C. section 360bbb-3(b)(1), unless the authorization is terminated or revoked sooner.  Performed at [facility], [place] [address], [place], [place] [address]    Weight 10/09/2020 3,120  oz Final   Height 10/09/2020 70  in Final   BP 10/09/2020 136/80  mmHg Final   Single Plane  A2C EF 10/09/2020 48.1  % Final   Single Plane A4C EF 10/09/2020 35.3  % Final   Calc EF 10/09/2020 44.3  % Final   S' Lateral 10/09/2020 4.50  cm Final   AR max vel 10/09/2020 1.78  cm2 Final   AV Area VTI 10/09/2020 1.71  cm2 Final   AV Mean grad 10/09/2020 3.0  mmHg Final   AV Peak grad 10/09/2020 5.7  mmHg Final   Ao pk vel 10/09/2020 1.19  m/s Final   Area-P 1/2 10/09/2020 4.80  cm2 Final   AV Area mean vel 10/09/2020 1.69  cm2 Final   Glucose-Capillary 10/09/2020 96  70 -  99 mg/dL Final   Glucose reference range applies only to samples taken after fasting for at least 8 hours.   Sodium 10/10/2020 136  135 - 145 mmol/L Final   Potassium 10/10/2020 4.1  3.5 - 5.1 mmol/L Final   Chloride 10/10/2020 105  98 - 111 mmol/L Final   CO2 10/10/2020 24  22 - 32 mmol/L Final   Glucose, Bld 10/10/2020 132 (H)  70 - 99 mg/dL Final   Glucose reference range applies only to samples taken after fasting for at least 8 hours.   BUN 10/10/2020 13  8 - 23 mg/dL Final   Creatinine, Ser 10/10/2020 0.93  0.61 - 1.24 mg/dL Final   Calcium 10/10/2020 9.2  8.9 - 10.3 mg/dL Final   GFR, Estimated 10/10/2020 >60  >60 mL/min Final   Comment: (NOTE) Calculated using the CKD-EPI Creatinine Equation (2021)    Anion gap 10/10/2020 7  5 - 15 Final   Performed at [facility] [address] [place], [place] [address]   WBC 10/10/2020 5.2  4.0 - 10.5 K/uL Final   RBC 10/10/2020 5.10  4.22 - 5.81 MIL/uL Final   Hemoglobin 10/10/2020 14.6  13.0 - 17.0 g/dL Final   HCT 10/10/2020 43.0  39.0 - 52.0 % Final   MCV 10/10/2020 84.3  80.0 - 100.0 fL Final   MCH 10/10/2020 28.6  26.0 - 34.0 pg Final   MCHC 10/10/2020 34.0  30.0 - 36.0 g/dL Final   RDW 10/10/2020 14.3  11.5 - 15.5 % Final   Platelets 10/10/2020 173  150 - 400 K/uL Final   nRBC 10/10/2020 0.0  0.0 - 0.2 % Final   Performed at [facility] [address], [place], [place] [address]   Hgb A1c MFr Bld 10/10/2020 8.2 (H)  4.8 - 5.6 % Final    Comment: (NOTE) Pre diabetes:          5.7%-6.4%  Diabetes:              >6.4%  Glycemic control for   <7.0% adults with diabetes    Mean Plasma Glucose 10/10/2020 188.64  mg/dL Final   Performed at [facility], [place] [address], [place], [place] [address]   Cholesterol 10/10/2020 135  0 - 200 mg/dL Final   Triglycerides 10/10/2020 95  <150 mg/dL Final   HDL 10/10/2020 34 (L)  >40 mg/dL Final   Total CHOL/HDL Ratio 10/10/2020 4.0  RATIO Final   VLDL 10/10/2020 19  0 - 40 mg/dL Final   LDL Cholesterol 10/10/2020 82  0 - 99 mg/dL Final   Comment:        Total Cholesterol/HDL:CHD Risk Coronary Heart Disease Risk Table                     Men   Women  1/2 Average Risk   3.4   3.3  Average Risk       5.0   4.4  2 X Average Risk   9.6   7.1  3 X Average Risk  23.4   11.0        Use the calculated Patient Ratio above and the CHD Risk Table to determine the patient's CHD Risk.        ATP III CLASSIFICATION (LDL):  <100     mg/dL   Optimal  100-129  mg/dL   Near or Above  Optimal  130-159  mg/dL   Borderline  160-189  mg/dL   High  >190     mg/dL   Very High Performed at [facility] [address] [place], [place] [address]    Glucose-Capillary 10/09/2020 179 (H)  70 - 99 mg/dL Final   Glucose reference range applies only to samples taken after fasting for at least 8 hours.   pH, Arterial 10/09/2020 7.428  7.350 - 7.450 Final   pCO2 arterial 10/09/2020 35.2  32.0 - 48.0 mmHg Final   pO2, Arterial 10/09/2020 100  83.0 - 108.0 mmHg Final   Bicarbonate 10/09/2020 23.2  20.0 - 28.0 mmol/L Final   TCO2 10/09/2020 24  22 - 32 mmol/L Final   O2 Saturation 10/09/2020 98.0  % Final   Acid-base deficit 10/09/2020 1.0  0.0 - 2.0 mmol/L Final   Sodium 10/09/2020 142  135 - 145 mmol/L Final   Potassium 10/09/2020 3.8  3.5 - 5.1 mmol/L Final   Calcium, Ion 10/09/2020 1.23  1.15 - 1.40 mmol/L Final   HCT 10/09/2020 41.0  39.0 - 52.0 % Final   Hemoglobin  10/09/2020 13.9  13.0 - 17.0 g/dL Final   Sample type 10/09/2020 ARTERIAL   Final   Glucose-Capillary 10/10/2020 140 (H)  70 - 99 mg/dL Final   Glucose reference range applies only to samples taken after fasting for at least 8 hours.   Total Protein 10/10/2020 6.0 (L)  6.5 - 8.1 g/dL Final   Albumin 10/10/2020 3.3 (L)  3.5 - 5.0 g/dL Final   AST 10/10/2020 21  15 - 41 U/L Final   ALT 10/10/2020 19  0 - 44 U/L Final   Alkaline Phosphatase 10/10/2020 52  38 - 126 U/L Final   Total Bilirubin 10/10/2020 0.8  0.3 - 1.2 mg/dL Final   Bilirubin, Direct 10/10/2020 0.1  0.0 - 0.2 mg/dL Final   Indirect Bilirubin 10/10/2020 0.7  0.3 - 0.9 mg/dL Final   Performed at [facility] [address] [place], [place] [address]   MRSA, PCR 10/10/2020 NEGATIVE  NEGATIVE Final   Staphylococcus aureus 10/10/2020 NEGATIVE  NEGATIVE Final   Comment: (NOTE) The Xpert SA Assay (FDA approved for NASAL specimens in patients 10 years of age and older), is one component of a comprehensive surveillance program. It is not intended to diagnose infection nor to guide or monitor treatment. Performed at [facility], [place] [address], [place], [place] [address]    Glucose-Capillary 10/10/2020 75  70 - 99 mg/dL Final   Glucose reference range applies only to samples taken after fasting for at least 8 hours.   Glucose-Capillary 10/10/2020 78  70 - 99 mg/dL Final   Glucose reference range applies only to samples taken after fasting for at least 8 hours.   Sodium 10/11/2020 136  135 - 145 mmol/L Final   Potassium 10/11/2020 4.0  3.5 - 5.1 mmol/L Final   Chloride 10/11/2020 105  98 - 111 mmol/L Final   CO2 10/11/2020 21 (L)  22 - 32 mmol/L Final   Glucose, Bld 10/11/2020 224 (H)  70 - 99 mg/dL Final   Glucose reference range applies only to samples taken after fasting for at least 8 hours.   BUN 10/11/2020 14  8 - 23 mg/dL Final   Creatinine, Ser 10/11/2020 1.06  0.61 - 1.24 mg/dL Final   Calcium 10/11/2020  9.2  8.9 - 10.3 mg/dL Final   GFR, Estimated 10/11/2020 >60  >60  mL/min Final   Comment: (NOTE) Calculated using the CKD-EPI Creatinine Equation (2021)    Anion gap 10/11/2020 10  5 - 15 Final   Performed at [facility], [place] [address] [place], [place] [address]   WBC 10/11/2020 4.9  4.0 - 10.5 K/uL Final   RBC 10/11/2020 4.96  4.22 - 5.81 MIL/uL Final   Hemoglobin 10/11/2020 14.0  13.0 - 17.0 g/dL Final   HCT 10/11/2020 41.9  39.0 - 52.0 % Final   MCV 10/11/2020 84.5  80.0 - 100.0 fL Final   MCH 10/11/2020 28.2  26.0 - 34.0 pg Final   MCHC 10/11/2020 33.4  30.0 - 36.0 g/dL Final   RDW 10/11/2020 14.3  11.5 - 15.5 % Final   Platelets 10/11/2020 172  150 - 400 K/uL Final   nRBC 10/11/2020 0.0  0.0 - 0.2 % Final   Performed at [facility] [address], [place], [place] [address]   Glucose-Capillary 10/10/2020 79  70 - 99 mg/dL Final   Glucose reference range applies only to samples taken after fasting for at least 8 hours.   Activated Clotting Time 10/10/2020 202  seconds Final   Activated Clotting Time 10/10/2020 457  seconds Final   Glucose-Capillary 10/10/2020 250 (H)  70 - 99 mg/dL Final   Glucose reference range applies only to samples taken after fasting for at least 8 hours.   Glucose-Capillary 10/11/2020 127 (H)  70 - 99 mg/dL Final   Glucose reference range applies only to samples taken after fasting for at least 8 hours.   Glucose-Capillary 10/10/2020 76  70 - 99 mg/dL Final   Glucose reference range applies only to samples taken after fasting for at least 8 hours.  Office Visit on 10/07/2020  Component Date Value Ref Range Status   Glucose 10/07/2020 193 (H)  65 - 99 mg/dL Final   BUN 10/07/2020 11  8 - 27 mg/dL Final   Creatinine, Ser 10/07/2020 0.95  0.76 - 1.27 mg/dL Final   eGFR 10/07/2020 90  >59 mL/min/1.73 Final   BUN/Creatinine Ratio 10/07/2020 12  10 - 24 Final   Sodium 10/07/2020 139  134 - 144 mmol/L Final   Potassium 10/07/2020 4.1  3.5 - 5.2  mmol/L Final   Chloride 10/07/2020 102  96 - 106 mmol/L Final   CO2 10/07/2020 24  20 - 29 mmol/L Final   Calcium 10/07/2020 9.7  8.6 - 10.2 mg/dL Final   WBC 10/07/2020 5.0  3.4 - 10.8 x10E3/uL Final   RBC 10/07/2020 5.18  4.14 - 5.80 x10E6/uL Final   Hemoglobin 10/07/2020 14.5  13.0 - 17.7 g/dL Final   Hematocrit 10/07/2020 44.3  37.5 - 51.0 % Final   MCV 10/07/2020 86  79 - 97 fL Final   MCH 10/07/2020 28.0  26.6 - 33.0 pg Final   MCHC 10/07/2020 32.7  31.5 - 35.7 g/dL Final   RDW 10/07/2020 14.6  11.6 - 15.4 % Final   Platelets 10/07/2020 194  150 - 450 x10E3/uL Final    Blood Alcohol level:  Lab Results  Component Value Date   ETH 38 (H) 03/06/2021   ETH <10 63/84/6659    Metabolic Disorder Labs: Lab Results  Component Value Date   HGBA1C 6.8 (H) 12/10/2020   MPG 148.46 12/10/2020   MPG 188.64 10/10/2020   No results found for: PROLACTIN Lab Results  Component Value Date   CHOL 177 12/10/2020   TRIG 124 12/10/2020   HDL 50  12/10/2020   CHOLHDL 3.5 12/10/2020   VLDL 25 12/10/2020   LDLCALC 102 (H) 12/10/2020   LDLCALC 82 10/10/2020    Therapeutic Lab Levels: No results found for: LITHIUM Lab Results  Component Value Date   VALPROATE <10.0 (L) 11/03/2012   VALPROATE 80.7 03/27/2012   No components found for:  CBMZ  Physical Findings   AIMS    Flowsheet Row Admission (Discharged) from 01/06/2021 in [facility] Admission (Discharged) from 10/09/2016 in [facility]  AIMS Total Score 0 0      AUDIT    Flowsheet Row Admission (Discharged) from 01/06/2021 in [facility] Admission (Discharged) from 12/08/2020 in [facility] Admission (Discharged) from 10/09/2016 in [facility] Admission (Discharged) from 07/23/2013 in [facility] Admission (Discharged) from  04/02/2013 in [facility]  Alcohol Use Disorder Identification Test Final Score (AUDIT) 13 5 0 4 0      PHQ2-9    Flowsheet Row ED from 01/04/2021 in [facility]  PHQ-2 Total Score 4  PHQ-9 Total Score 12      Flowsheet Row ED from 03/06/2021 in [facility] Most recent reading at 03/06/2021  9:58 PM ED from 03/06/2021 in [facility] Most recent reading at 03/06/2021  6:40 PM ED from 01/25/2021 in [facility] Most recent reading at 01/27/2021 10:18 AM  C-SSRS RISK CATEGORY High Risk High Risk High Risk        Musculoskeletal  Strength & Muscle Tone: within normal limits Gait & Station: normal Patient leans: N/A  Psychiatric Specialty Exam  Presentation  General Appearance: Appropriate for Environment  Eye Contact:Fair  Speech:Clear and Coherent  Speech Volume:Normal  Handedness:Right   Mood and Affect  Mood:Depressed  Affect:Congruent   Thought Process  Thought Processes:Coherent  Descriptions of Associations:Intact  Orientation:Full (Time, Place and Person)  Thought Content:Logical  Diagnosis of Schizophrenia or Schizoaffective disorder in past: No  Duration of Psychotic Symptoms: Greater than six months   Hallucinations:Hallucinations: None Description of Auditory Hallucinations: voices that tell me to kill myself  Ideas of Reference:None  Suicidal Thoughts:Suicidal Thoughts: Yes, Active SI Active Intent and/or Plan: With Plan; With Means to Carry Out  Homicidal Thoughts:Homicidal Thoughts: No HI Passive Intent and/or Plan: Without Intent; Without Plan; With Means to [place]; Remote Fair; Recent Fair  Judgment:Fair  Insight:Fair   Executive Functions  Concentration:Fair  Attention Span:Fair  [place]   Psychomotor Activity  Psychomotor Activity:Psychomotor Activity: Normal   Assets  Assets:Communication Skills; Desire for Improvement; Financial Resources/Insurance; Leisure Time; Physical Health   Sleep  Sleep:Sleep: Fair Number of Hours of Sleep: 6   Physical Exam  Physical Exam Constitutional:      Appearance: Normal appearance.  HENT:     Head: Normocephalic and atraumatic.     Nose: Nose normal.  Eyes:     Conjunctiva/sclera: Conjunctivae normal.  Cardiovascular:     Rate and Rhythm: Normal rate.  Pulmonary:     Effort: Pulmonary effort is normal.  Musculoskeletal:        General: Normal range of motion.     Cervical back: Normal range of motion.  Neurological:     Mental Status: He is alert and oriented to person,  place, and time.   Review of Systems  Constitutional: Negative.   HENT: Negative.    Eyes: Negative.   Respiratory: Negative.    Cardiovascular: Negative.   Skin: Negative.   Neurological: Negative.   Endo/Heme/Allergies: Negative.   Blood pressure 126/74, pulse 95, temperature 98.5 F (36.9 C), temperature source Oral, resp. rate 16, SpO2 100 %. There is no height or weight on file to calculate BMI.  Treatment Plan Summary: Disposition: patient meets criteria for inpatient psychiatric admission. SW notified patient has VA benefits and to seek placement.    Continue home medications:  Abilify 67m QD Cymbalta 30 mg QD Gabapentin 100 mg TID  Hydroxyzine 25 mg TID PRN  Metformin 500 mg 24 hr tab, Metoprolol succinate 24 hr 25 mg QD.   Labs reviewed: Pos cocaine  BAL-38 QTC 479 Repeat EKG ordered for 03/09/21  WMarissa Calamity NP 03/08/2021 10:37 AM

## 2021-03-08 NOTE — ED Notes (Signed)
Received patient this PM. Patient in his bed sleeping. Patient respirations are even and unlabored. Will continue to monitor for safety.

## 2021-03-08 NOTE — ED Notes (Signed)
Pt given Coffee.  Sitting up watching TV. No distress. Will continue to monitor for safety.

## 2021-03-08 NOTE — Progress Notes (Signed)
Per Elpidio Galea, patient meets criteria for inpatient treatment. There are no available or appropriate beds at [facility] today. CSW faxed referrals to the following facilities for review:  [facility]  Pending - No Request Sent N/A [address] [place] [address] [phone] --  [facility] [address] [place] [address] [phone] --  [facility]  Pending - No Request Sent N/A [address] [place] [address] [phone] --  [facility]  Pending - No Request Sent N/A [address] [place] [address] [phone] --  [facility]  Pending - No Request Sent N/A [address], [place] [address] [phone] --  [facility]  Pending - No Request Sent N/A [address] [place] [address] [phone] --  [facility]  Pending - No Request Sent N/A [address], [place] [address] [phone] --   TTS will continue to seek bed placement.  Glennie [place], MSW, [place], LCAS-A Phone: [phone] Disposition/TOC

## 2021-03-08 NOTE — ED Notes (Signed)
Pt currenlty resting on pull out bed. No s&s of distress observed. Safety maintained and will continue to monitor.

## 2021-03-08 NOTE — ED Notes (Signed)
Pt resting on pull out bed. Accepted meals, snacks and meds w/o difficulty while in my care today. No s&s of distress. Safety maintained and will continue to monitor.

## 2021-03-09 ENCOUNTER — Inpatient Hospital Stay (HOSPITAL_COMMUNITY)
Admission: AD | Admit: 2021-03-09 | Discharge: 2021-03-11 | DRG: 885 | Disposition: A | Discharge status: Home / Self Care | Payer: Medicaid Other | Source: Intra-hospital | Attending: Emergency Medicine | Admitting: Emergency Medicine

## 2021-03-09 ENCOUNTER — Other Ambulatory Visit: Payer: Self-pay

## 2021-03-09 ENCOUNTER — Encounter (HOSPITAL_COMMUNITY): Payer: Self-pay | Admitting: Psychiatry

## 2021-03-09 DIAGNOSIS — F314 Bipolar disorder, current episode depressed, severe, without psychotic features: Secondary | ICD-10-CM | POA: Diagnosis not present

## 2021-03-09 DIAGNOSIS — F1410 Cocaine abuse, uncomplicated: Secondary | ICD-10-CM | POA: Diagnosis present

## 2021-03-09 DIAGNOSIS — F1994 Other psychoactive substance use, unspecified with psychoactive substance-induced mood disorder: Secondary | ICD-10-CM | POA: Diagnosis present

## 2021-03-09 DIAGNOSIS — F4310 Post-traumatic stress disorder, unspecified: Secondary | ICD-10-CM | POA: Diagnosis present

## 2021-03-09 DIAGNOSIS — F1424 Cocaine dependence with cocaine-induced mood disorder: Secondary | ICD-10-CM | POA: Diagnosis present

## 2021-03-09 DIAGNOSIS — F17200 Nicotine dependence, unspecified, uncomplicated: Secondary | ICD-10-CM | POA: Diagnosis present

## 2021-03-09 DIAGNOSIS — Z5900 Homelessness unspecified: Secondary | ICD-10-CM

## 2021-03-09 DIAGNOSIS — Z79899 Other long term (current) drug therapy: Secondary | ICD-10-CM | POA: Diagnosis not present

## 2021-03-09 DIAGNOSIS — Z7984 Long term (current) use of oral hypoglycemic drugs: Secondary | ICD-10-CM | POA: Diagnosis not present

## 2021-03-09 DIAGNOSIS — R45851 Suicidal ideations: Secondary | ICD-10-CM | POA: Diagnosis present

## 2021-03-09 DIAGNOSIS — F1020 Alcohol dependence, uncomplicated: Secondary | ICD-10-CM | POA: Diagnosis present

## 2021-03-09 DIAGNOSIS — Z765 Malingerer [conscious simulation]: Secondary | ICD-10-CM

## 2021-03-09 DIAGNOSIS — K279 Peptic ulcer, site unspecified, unspecified as acute or chronic, without hemorrhage or perforation: Secondary | ICD-10-CM | POA: Diagnosis present

## 2021-03-09 DIAGNOSIS — Z20822 Contact with and (suspected) exposure to covid-19: Secondary | ICD-10-CM | POA: Diagnosis present

## 2021-03-09 DIAGNOSIS — F1920 Other psychoactive substance dependence, uncomplicated: Secondary | ICD-10-CM | POA: Diagnosis present

## 2021-03-09 DIAGNOSIS — Y901 Blood alcohol level of 20-39 mg/100 ml: Secondary | ICD-10-CM | POA: Diagnosis present

## 2021-03-09 DIAGNOSIS — F332 Major depressive disorder, recurrent severe without psychotic features: Secondary | ICD-10-CM | POA: Diagnosis not present

## 2021-03-09 DIAGNOSIS — F333 Major depressive disorder, recurrent, severe with psychotic symptoms: Secondary | ICD-10-CM | POA: Diagnosis present

## 2021-03-09 DIAGNOSIS — E119 Type 2 diabetes mellitus without complications: Secondary | ICD-10-CM | POA: Diagnosis present

## 2021-03-09 DIAGNOSIS — F1420 Cocaine dependence, uncomplicated: Secondary | ICD-10-CM | POA: Diagnosis present

## 2021-03-09 DIAGNOSIS — Z818 Family history of other mental and behavioral disorders: Secondary | ICD-10-CM

## 2021-03-09 DIAGNOSIS — F10232 Alcohol dependence with withdrawal with perceptual disturbance: Secondary | ICD-10-CM | POA: Diagnosis present

## 2021-03-09 LAB — RESP PANEL BY RT-PCR (FLU A&B, COVID) ARPGX2
Influenza A by PCR: NEGATIVE
Influenza B by PCR: NEGATIVE
SARS Coronavirus 2 by RT PCR: NEGATIVE

## 2021-03-09 MED ORDER — GABAPENTIN 100 MG PO CAPS
100.0000 mg | ORAL_CAPSULE | Freq: Three times a day (TID) | ORAL | Status: DC
  Administered 2021-03-09 – 2021-03-11 (×5): 100 mg via ORAL
  Filled 2021-03-09 (×10): qty 1

## 2021-03-09 MED ORDER — ALUM & MAG HYDROXIDE-SIMETH 200-200-20 MG/5ML PO SUSP: 30.0000 mL | ORAL | Status: DC | PRN

## 2021-03-09 MED ORDER — ACETAMINOPHEN 325 MG PO TABS: 650.0000 mg | ORAL_TABLET | Freq: Four times a day (QID) | ORAL | Status: DC | PRN

## 2021-03-09 MED ORDER — OLANZAPINE 10 MG PO TBDP: 10.0000 mg | ORAL_TABLET | Freq: Three times a day (TID) | ORAL | Status: DC | PRN

## 2021-03-09 MED ORDER — MAGNESIUM HYDROXIDE 400 MG/5ML PO SUSP: 30.0000 mL | Freq: Every day | ORAL | Status: DC | PRN

## 2021-03-09 MED ORDER — LORAZEPAM 1 MG PO TABS: 1.0000 mg | ORAL_TABLET | ORAL | Status: DC | PRN

## 2021-03-09 MED ORDER — HYDROXYZINE HCL 25 MG PO TABS
25.0000 mg | ORAL_TABLET | Freq: Three times a day (TID) | ORAL | Status: DC | PRN
  Administered 2021-03-09: 25 mg via ORAL
  Filled 2021-03-09 (×2): qty 1

## 2021-03-09 MED ORDER — ARIPIPRAZOLE 5 MG PO TABS
5.0000 mg | ORAL_TABLET | Freq: Every day | ORAL | Status: DC
  Administered 2021-03-10 – 2021-03-11 (×2): 5 mg via ORAL
  Filled 2021-03-09 (×4): qty 1

## 2021-03-09 MED ORDER — DULOXETINE HCL 30 MG PO CPEP
30.0000 mg | ORAL_CAPSULE | Freq: Every day | ORAL | Status: DC
  Administered 2021-03-10 – 2021-03-11 (×2): 30 mg via ORAL
  Filled 2021-03-09 (×4): qty 1

## 2021-03-09 MED ORDER — ZIPRASIDONE MESYLATE 20 MG IM SOLR: 20.0000 mg | INTRAMUSCULAR | Status: DC | PRN

## 2021-03-09 MED ORDER — METFORMIN HCL ER 500 MG PO TB24
500.0000 mg | ORAL_TABLET | Freq: Every day | ORAL | Status: DC
  Administered 2021-03-10 – 2021-03-11 (×2): 500 mg via ORAL
  Filled 2021-03-09 (×4): qty 1

## 2021-03-09 MED ORDER — TRAZODONE HCL 50 MG PO TABS
50.0000 mg | ORAL_TABLET | Freq: Every evening | ORAL | Status: DC | PRN
  Administered 2021-03-09: 50 mg via ORAL
  Filled 2021-03-09 (×2): qty 1

## 2021-03-09 MED ORDER — METOPROLOL SUCCINATE ER 25 MG PO TB24
25.0000 mg | ORAL_TABLET | Freq: Every day | ORAL | Status: DC
  Administered 2021-03-10 – 2021-03-11 (×2): 25 mg via ORAL
  Filled 2021-03-09 (×4): qty 1

## 2021-03-09 NOTE — Tx Team (Signed)
Initial Treatment Plan 03/09/2021 8:02 PM Kron Everton CNO:[id number]    PATIENT STRESSORS: Health problems   Substance abuse     PATIENT STRENGTHS: Ability for insight  Average or above average intelligence  Capable of independent living  Communication skills  Motivation for treatment/growth    PATIENT IDENTIFIED PROBLEMS: Suicidal ideation    AH - command    Increased depression             DISCHARGE CRITERIA:  Adequate post-discharge living arrangements Improved stabilization in mood, thinking, and/or behavior Need for constant or close observation no longer present Reduction of life-threatening or endangering symptoms to within safe limits Verbal commitment to aftercare and medication compliance  PRELIMINARY DISCHARGE PLAN: Attend 12-step recovery group Outpatient therapy Placement in alternative living arrangements  PATIENT/FAMILY INVOLVEMENT: This treatment plan has been presented to and reviewed with the patient, [name],   The patient has been given the opportunity to ask questions and make suggestions.  Waymond Cera, RN 03/09/2021, 8:02 PM

## 2021-03-09 NOTE — ED Provider Notes (Signed)
FBC/OBS ASAP Discharge Summary  Date and Time: 03/09/2021 6:25 PM  Name: [name]  MRN:  [id number]   Discharge Diagnoses:  Final diagnoses:  Bipolar affective disorder, depressed, severe ([place])  Suicidal ideation  Cocaine substance abuse ([place])  Alcohol abuse    Subjective:  [name], 66 y.o., male patient who initially presented to [facility] ED on 03/06/2021.  He was transferred to [place] for overnight continuous assessment.  He was reevaluated and recommended for inpatient psychiatric admission.  He has remained on the unit while awaiting inpatient psychiatric bed availability. Patient seen face to face by this provider, Dr. Serafina Mitchell; and  chart reviewed on 03/09/21. Per chart review patient has a history of Bipolar effective disorder, depression, poly substance abuse (cocaine and alcohol), PTSD and SI.  Per chart review he is prescribed Plavix 75 mg daily, Abilify 10 mg daily, Cymbalta 30 mg daily, gabapentin 300 mg 3 times daily, Vistaril 25 mg 3 times daily as needed, metformin 24-hour tablet 500 mg daily, metoprolol succinate 25 mg 24-hour tablet daily.  He reports being homeless.  Patient has a history of inpatient psychiatric admissions his last admission was with [facility] 01/2021 and [facility] 12/2020.  He endorses a history of alcohol and cocaine use.  His last use was 48 hours before presenting to [facility].  He denies any withdrawal symptoms at this time he is tolerating medications with no adverse reactions.  During evaluation [name] is in sitting position.  He asked for an update on getting him placed in a facility for inpatient admission.  This writer explained the admission process when the [place] is involved.  He is alert/oriented x4 and cooperative.  He is logical and able to answer questions appropriately.  He makes good eye contact.Patient is speaking in a clear tone at moderate volume, and normal pace. He continues to endorse depression and  anxiety.  His affect is depressed.  He does not appear to be responding to internal/external stimuli.  He continues to endorse auditory hallucinations states, "voices tell me to kill myself".  He denies visual hallucinations and HI.  He endorses paranoia.  He does not appear to have delusional thought content.  Patient continues to endorse SI with a plan to shoot himself.  He has access to means, intent and plan to shoot himself.  Per chart review there could be an aspect of malingering.  However, patient endorses suicidal ideations with a plan to shoot himself.  He reports he has access to a firearm that he has hidden in the woods that no one has access to except for him.  This writer is unable to perform a safe discharge at this time.  Patient continues to meet inpatient psychiatric admission criteria  Stay Summary:   Patient remained calm and cooperative on the unit. He exhibited no unsafe behaviors. He continued to endorse SI and can not contract for safety.  He continues to meet inpatient psychiatric admission criteria.  Total Time spent with patient: 20 minutes  Past Psychiatric History: see h & p Past Medical History:  Past Medical History:  Diagnosis Date   Anxiety    Arthritis    Cancer ([place])    Cataract    Depression    Diabetes mellitus    GERD (gastroesophageal reflux disease)    H/O suicide attempt 1997 and 2011   jumped out of moving vehicle and attempted to jump into moving traffic   High cholesterol    Hypertension  Neuropathic pain    Osteoporosis     Past Surgical History:  Procedure Laterality Date   COLONOSCOPY     CORONARY STENT INTERVENTION N/A 10/10/2020   Procedure: CORONARY STENT INTERVENTION;  Surgeon: Jettie Booze, MD;  Location: [place] [facility];  Service: Cardiovascular;  Laterality: N/A;   LEFT HEART CATH AND CORONARY ANGIOGRAPHY N/A 10/10/2020   Procedure: LEFT HEART CATH AND CORONARY ANGIOGRAPHY;  Surgeon: Jettie Booze, MD;   Location: [place] [facility];  Service: Cardiovascular;  Laterality: N/A;   NEPHRECTOMY     due to cancer   RIGHT/LEFT HEART CATH AND CORONARY ANGIOGRAPHY N/A 10/09/2020   Procedure: RIGHT/LEFT HEART CATH AND CORONARY ANGIOGRAPHY;  Surgeon: Nelva Bush, MD;  Location: [place] [facility];  Service: Cardiovascular;  Laterality: N/A;   Family History:  Family History  Problem Relation Age of Onset   Alcohol abuse Sister    Alcohol abuse Brother    Colon cancer Neg Hx    Esophageal cancer Neg Hx    Pancreatic cancer Neg Hx    Rectal cancer Neg Hx    Stomach cancer Neg Hx    Family Psychiatric History: see H&p Social History:  Social History   Substance and Sexual Activity  Alcohol Use Yes   Alcohol/week: 3.0 standard drinks   Types: 3 Cans of beer per week   Comment: Pt has been clean for 8 months     Social History   Substance and Sexual Activity  Drug Use Yes   Types: "Crack" cocaine, Cocaine    Social History   Socioeconomic History   Marital status: Single    Spouse name: Not on file   Number of children: Not on file   Years of education: Not on file   Highest education level: Not on file  Occupational History   Not on file  Tobacco Use   Smoking status: Every Day    Packs/day: 0.50    Years: 20.00    Pack years: 10.00    Types: Cigarettes   Smokeless tobacco: Never  Vaping Use   Vaping Use: Never used  Substance and Sexual Activity   Alcohol use: Yes    Alcohol/week: 3.0 standard drinks    Types: 3 Cans of beer per week    Comment: Pt has been clean for 8 months   Drug use: Yes    Types: "Crack" cocaine, Cocaine   Sexual activity: Yes    Birth control/protection: Condom  Other Topics Concern   Not on file  Social History Narrative   Lives alone.  Has sister in area.   Social Determinants of Health   Financial Resource Strain: Not on file  Food Insecurity: Not on file  Transportation Needs: Not on file  Physical Activity: Not on file   Stress: Not on file  Social Connections: Not on file   SDOH:  SDOH Screenings   Alcohol Screen: Medium Risk   Last Alcohol Screening Score (AUDIT): 13  Depression (PHQ2-9): Medium Risk   PHQ-2 Score: 12  Financial Resource Strain: Not on file  Food Insecurity: Not on file  Housing: Not on file  Physical Activity: Not on file  Social Connections: Not on file  Stress: Not on file  Tobacco Use: High Risk   Smoking Tobacco Use: Every Day   Smokeless Tobacco Use: Never   Passive Exposure: Not on file  Transportation Needs: Not on file    Tobacco Cessation:  N/A, patient does not currently use tobacco  products  Current Medications:  Current Facility-Administered Medications  Medication Dose Route Frequency Provider Last Rate Last Admin   acetaminophen (TYLENOL) tablet 650 mg  650 mg Oral Q6H PRN Ajibola, Ene A, NP       alum & mag hydroxide-simeth (MAALOX/MYLANTA) 200-200-20 MG/5ML suspension 30 mL  30 mL Oral Q4H PRN Ajibola, Ene A, NP       ARIPiprazole (ABILIFY) tablet 5 mg  5 mg Oral Daily Thomes Lolling H, NP   5 mg at 03/09/21 4818   DULoxetine (CYMBALTA) DR capsule 30 mg  30 mg Oral Daily Revonda Humphrey, NP   30 mg at 03/09/21 5631   gabapentin (NEURONTIN) capsule 100 mg  100 mg Oral TID Revonda Humphrey, NP   100 mg at 03/09/21 1515   hydrOXYzine (ATARAX) tablet 25 mg  25 mg Oral TID PRN Revonda Humphrey, NP       magnesium hydroxide (MILK OF MAGNESIA) suspension 30 mL  30 mL Oral Daily PRN Ajibola, Ene A, NP       metFORMIN (GLUCOPHAGE-XR) 24 hr tablet 500 mg  500 mg Oral Q breakfast Thomes Lolling H, NP   500 mg at 03/09/21 0755   metoprolol succinate (TOPROL-XL) 24 hr tablet 25 mg  25 mg Oral Daily Thomes Lolling H, NP   25 mg at 03/09/21 4970   traZODone (DESYREL) tablet 50 mg  50 mg Oral QHS PRN Ajibola, Ene A, NP   50 mg at 03/08/21 2112   Current Outpatient Medications  Medication Sig Dispense Refill   albuterol (VENTOLIN HFA) 108 (90 Base) MCG/ACT  inhaler Inhale 1-2 puffs into the lungs every 6 (six) hours as needed for wheezing or shortness of breath. 1 each 0   ARIPiprazole (ABILIFY) 10 MG tablet Take 1 tablet (10 mg total) by mouth daily. Bipolar disorder 30 tablet 0   aspirin EC 81 MG EC tablet Take 1 tablet (81 mg total) by mouth daily. Blood vessel disease 30 tablet 0   clopidogrel (PLAVIX) 75 MG tablet Take 1 tablet (75 mg total) by mouth daily. Prevention of blood clots 30 tablet 0   gabapentin (NEURONTIN) 300 MG capsule Take 1 capsule (300 mg total) by mouth 3 (three) times daily. Neuropathy 90 capsule 0   hydrOXYzine (ATARAX/VISTARIL) 25 MG tablet Take 1 tablet (25 mg total) by mouth 3 (three) times daily as needed for anxiety. anxiety (Patient taking differently: Take 25 mg by mouth 3 (three) times daily as needed for anxiety.) 30 tablet 0   metFORMIN (FORTAMET) 1000 MG (OSM) 24 hr tablet Take 1 tablet (1,000 mg total) by mouth daily with supper. Diabetes 30 tablet 0   metFORMIN (GLUCOPHAGE-XR) 500 MG 24 hr tablet Take 1 tablet (500 mg total) by mouth daily with breakfast. diabetes (Patient not taking: Reported on 01/25/2021) 30 tablet 0   metoprolol succinate (TOPROL-XL) 25 MG 24 hr tablet Take 1 tablet (25 mg total) by mouth daily. Elevated blood pressure 30 tablet 0   Multiple Vitamin (MULTIVITAMIN WITH MINERALS) TABS tablet Take 1 tablet by mouth daily as needed (for supplementation).     nicotine polacrilex (NICORETTE) 2 MG gum Take 1 each (2 mg total) by mouth as needed for smoking cessation. 100 tablet 0   pantoprazole (PROTONIX) 40 MG tablet Take 1 tablet (40 mg total) by mouth daily. Acid reflux disease 30 tablet 0   rosuvastatin (CRESTOR) 40 MG tablet Take 1 tablet (40 mg total) by mouth daily. Elevated cholesterol 30 tablet 0  traZODone (DESYREL) 50 MG tablet Take 50 mg by mouth at bedtime as needed for sleep.      PTA Medications: (Not in a hospital admission)   Musculoskeletal  Strength & Muscle Tone: within normal  limits Gait & Station: normal Patient leans: N/A  Psychiatric Specialty Exam  Presentation  General Appearance: Appropriate for Environment  Eye Contact:Good  Speech:Clear and Coherent; Normal Rate  Speech Volume:Normal  Handedness:Right   Mood and Affect  Mood:Depressed  Affect:Congruent   Thought Process  Thought Processes:Coherent  Descriptions of Associations:Intact  Orientation:Full (Time, Place and Person)  Thought Content:Logical  Diagnosis of Schizophrenia or Schizoaffective disorder in past: No  Duration of Psychotic Symptoms: Greater than six months   Hallucinations:Hallucinations: None Description of Auditory Hallucinations: voices that tell him to kill himself  Ideas of Reference:None  Suicidal Thoughts:Suicidal Thoughts: Yes, Active SI Active Intent and/or Plan: With Intent; With Plan; With Access to Means  Homicidal Thoughts:Homicidal Thoughts: No   Sensorium  Memory:Immediate Fair; Recent Fair; Remote [place]  Insight:Fair   Executive Functions  Concentration:Good  Attention Span:Good  [place]  Language:Good   Psychomotor Activity  Psychomotor Activity:Psychomotor Activity: Normal   Assets  Assets:Communication Skills; Desire for Improvement; Financial Resources/Insurance; Physical Health   Sleep  Sleep:Sleep: Good   No data recorded  Physical Exam  Physical Exam Vitals and nursing note reviewed.  Constitutional:      General: He is not in acute distress.    Appearance: He is well-developed.  HENT:     Head: Normocephalic and atraumatic.  Eyes:     Conjunctiva/sclera: Conjunctivae normal.  Cardiovascular:     Rate and Rhythm: Normal rate.  Pulmonary:     Effort: Pulmonary effort is normal. No respiratory distress.  Musculoskeletal:        General: Normal range of motion.     Cervical back: Normal range of motion.  Skin:    Coloration: Skin is not jaundiced or pale.   Neurological:     Mental Status: He is alert and oriented to person, place, and time.  Psychiatric:        Attention and Perception: Attention normal. He perceives auditory hallucinations.        Mood and Affect: Mood is depressed.        Speech: Speech normal.        Behavior: Behavior normal. Behavior is cooperative.        Thought Content: Thought content is paranoid. Thought content includes suicidal ideation. Thought content includes suicidal plan.        Cognition and Memory: Cognition normal.        Judgment: Judgment is impulsive.   Review of Systems  Constitutional: Negative.   HENT: Negative.    Eyes: Negative.   Respiratory: Negative.    Cardiovascular: Negative.   Musculoskeletal: Negative.   Skin: Negative.   Neurological: Negative.   Psychiatric/Behavioral:  Positive for depression, hallucinations and suicidal ideas.   Blood pressure 135/86, pulse (!) 102, temperature 98.1 F (36.7 C), resp. rate 18, SpO2 98 %. There is no height or weight on file to calculate BMI.  Demographic Factors:  Male, Low socioeconomic status, Living alone, Unemployed, and Access to firearms  Loss Factors: Financial problems/change in socioeconomic status  Historical Factors: Impulsivity  Risk Reduction Factors:   None apply  Continued Clinical Symptoms:  Severe Anxiety and/or Agitation Bipolar Disorder:   Depressive phase Depression:   Comorbid alcohol abuse/dependence Hopelessness Impulsivity Alcohol/Substance Abuse/Dependencies  Cognitive  Features That Contribute To Risk:  None    Suicide Risk:  Severe:  Frequent, intense, and enduring suicidal ideation, specific plan, no subjective intent, but some objective markers of intent (i.e., choice of lethal method), the method is accessible, some limited preparatory behavior, evidence of impaired self-control, severe dysphoria/symptomatology, multiple risk factors present, and few if any protective factors, particularly a lack of  social support.  Plan Of Care/Follow-up recommendations:  Activity:  as tolerated  Diet:  regular   Disposition:   Patient continues to meet inpatient psychiatric admission criteria.  [facility] notified.  Patient has been accepted.  He will be transported via safe transport.  Revonda Humphrey, NP 03/09/2021, 6:25 PM

## 2021-03-09 NOTE — ED Notes (Signed)
Pt resting quietly without incident.  Will continue to monitor for safety.

## 2021-03-09 NOTE — ED Notes (Signed)
All of pt's belongings from locker 13 given to driver of safe transport along with D/C paperwork.   Pt cooperative with no distress noted.

## 2021-03-09 NOTE — Progress Notes (Signed)
CSW contacted [facility] hospital regarding bed availability. CSW spoke with [place] at [place] the Scientist, research (life sciences) at [facility], who confirmed that there are no beds available at this time.    [name], MSW, LCSW-A  3:04 PM 03/09/2021

## 2021-03-09 NOTE — Progress Notes (Addendum)
Patient is a 66 year old male who presented voluntarily from the [facility] for SI and worsening depression. Patient has a hx of Bipolar d/o, MDD, alcohol and cocaine abuse. Pt endorses passive SI, but contracts for safety. Pt currently denies A/VH, but reports that he sometimes hears voices telling him to harm himself. Patient presented with a sad affect/ depressed mood, calm, cooperative behavior - answered questions logically and coherently during admission interview and assessment. VS monitored and recorded. Skin check performed with another RN. Belongings searched and secured in locker. Admission paperwork completed and signed. Patient was oriented to unit and schedule. Dinner and po fluids provided. Q 15 min checks initiated for safety.

## 2021-03-09 NOTE — ED Notes (Signed)
Report  called to [facility] RN  verbalized understanding. Safe transport called at this time.

## 2021-03-09 NOTE — ED Notes (Signed)
Pt is awake and alert.   Flat affect with good eye contact.  Pt states that he still has some thoughts of SI at times, denies HI or AVH.  Pt complaining that he got the"wrong muffin" this morning at breakfast.  Pt redirected.   Will monitor for safety and await disposition.

## 2021-03-09 NOTE — Progress Notes (Signed)
CSW contacted Transfer Coordinator April at [facility] regarding bed availability but there was no answer. CSW left a voice message asking her to return the call.   [name], MSW, LCSW-A  12:04 PM 03/09/2021

## 2021-03-09 NOTE — Progress Notes (Signed)
CSW spoke with April from [place]. Per April, [facility] is on mental health capacity, [place] on mental health diversion, [place] on mental health diversion, [place] on mental health diversion, [place] has three beds available, [place] on mental health capacity, and [place] is on mental health capacity. Patients who have been IVC'D will not be able to cross state lines. CSW will seek availability at [facility] for recommended disposition.    [name], MSW, LCSW-A  2:12 PM 03/09/2021

## 2021-03-09 NOTE — ED Notes (Signed)
Patient is asleep in his bed. Patient respirations are even and unlabored. Will continue to monitor for safety.

## 2021-03-09 NOTE — ED Notes (Signed)
Pt talking and laughing with other pts.

## 2021-03-09 NOTE — Progress Notes (Signed)
Psychoeducational Group Note  Date:  03/09/2021 Time:  2226  Group Topic/Focus:  Wrap-Up Group:   The focus of this group is to help patients review their daily goal of treatment and discuss progress on daily workbooks.  Participation Level: Did Not Attend  Participation Quality:  Not Applicable  Affect:  Not Applicable  Cognitive:  Not Applicable  Insight:  Not Applicable  Engagement in Group: Not Applicable  Additional Comments:  The patient did not attend group this evening.   Archie Balboa S 03/09/2021, 10:26 PM

## 2021-03-09 NOTE — ED Notes (Signed)
Patient continues to endorse SI.Patient stated he still felt suicidal. Evening medication administered without difficulty to patient. Patient stays to himself. Patient is cooperative and interacts well with staff. Respiratory is even and unlabored. No distress noted. Patient sleeping or resting in bed at present. Patient stated no complaints at present. will continue to monitor for safety.

## 2021-03-09 NOTE — ED Notes (Signed)
Pt watching tv

## 2021-03-10 ENCOUNTER — Encounter (HOSPITAL_COMMUNITY): Payer: Self-pay | Admitting: Psychiatry

## 2021-03-10 DIAGNOSIS — F332 Major depressive disorder, recurrent severe without psychotic features: Secondary | ICD-10-CM | POA: Diagnosis not present

## 2021-03-10 MED ORDER — NICOTINE 21 MG/24HR TD PT24
21.0000 mg | MEDICATED_PATCH | Freq: Every day | TRANSDERMAL | Status: DC
  Administered 2021-03-10 – 2021-03-11 (×2): 21 mg via TRANSDERMAL
  Filled 2021-03-10 (×3): qty 1

## 2021-03-10 NOTE — H&P (Signed)
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 patient's condition.    Signed: Merrily Brittle, DO Psychiatry Resident, PGY-1 [facility] 03/10/2021, 7:20 PM

## 2021-03-10 NOTE — Progress Notes (Signed)
°   03/10/21 0500  Sleep  Number of Hours 7.75

## 2021-03-10 NOTE — Plan of Care (Signed)
  Problem: Education: Goal: Knowledge of [facility] Education information/materials will improve Outcome: Progressing Goal: Emotional status will improve Outcome: Progressing Goal: Mental status will improve Outcome: Progressing   
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
## 2021-03-10 NOTE — BHH Suicide Risk Assessment (Signed)
[place] INPATIENT:  Family/Significant Other Suicide Prevention Education  Suicide Prevention Education:  Patient Refusal for Family/Significant Other Suicide Prevention Education: The patient [name] has refused to provide written consent for family/significant other to be provided Family/Significant Other Suicide Prevention Education during admission and/or prior to discharge.  Physician notified.  Frutoso Chase Virgene Tirone 03/10/2021, 1:23 PM
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
## 2021-03-10 NOTE — Progress Notes (Signed)
Psychoeducational Group Note  Date:  03/10/2021 Time:  2240  Group Topic/Focus:  Wrap-Up Group:   The focus of this group is to help patients review their daily goal of treatment and discuss progress on daily workbooks.  Participation Level: Did Not Attend  Participation Quality:  Not Applicable  Affect:  Not Applicable  Cognitive:  Not Applicable  Insight:  Not Applicable  Engagement in Group: Not Applicable  Additional Comments:  The patient did not attend group this evening.   Archie Balboa S 03/10/2021, 10:40 PM

## 2021-03-10 NOTE — Progress Notes (Signed)
Progress note  Pt found in bed; compliant with medication administration. Pt presents anxious, worried, sad and sullen. Pt had complaints of detox symptoms and asked if they could have ativan for symptoms. Pt was asked to discuss this their provider. Pt has been seen in their room and did not attend afternoon group. Pt is pleasant. Pt denies si/hi/ah/vh and verbally agrees to approach staff if these become apparent or before harming themselves/others while at [place].  A: Pt provided support and encouragement. Pt given medication per protocol and standing orders. Q17m safety checks implemented and continued.  R: Pt safe on the unit. Will continue to monitor.

## 2021-03-10 NOTE — Group Note (Signed)
Recreation Therapy Group Note   Group Topic:Animal Assisted Therapy   Group Date: 03/10/2021 Start Time: 1430 End Time: 1515 Facilitators: Victorino Sparrow, LRT,CTRS Location: [address]   AAA/T Program Assumption of Risk Form signed by Patient/ or Parent Legal Guardian YES  Patient is free of allergies or severe asthma  YES  Patient reports no fear of animals YES  Patient reports no history of cruelty to animals YES  Patient understands their participation is voluntary YES  Patient washes hands before animal contact YES  Patient washes hands after animal contact YES   Group Description: Patients provided opportunity to interact with trained and credentialed [facility] Therapy dog and the community volunteer/dog handler. Patients practiced appropriate animal interaction and were educated on dog safety outside of the hospital in common community settings. Patients were allowed to use dog toys and other items to practice commands, engage the dog in play, and/or complete routine aspects of animal care.    Affect/Mood: Appropriate   Participation Level: Minimal    Clinical Observations/Individualized Feedback: Pt listened and observed peers as they talked, asked questions and engaged with therapy dog team.    Plan: Continue to engage patient in RT group sessions 2-3x/week.   Victorino Sparrow, Glennis Brink 03/10/2021 3:47 PM

## 2021-03-10 NOTE — Progress Notes (Signed)
°   03/10/21 2200  Psych Admission Type (Psych Patients Only)  Admission Status Voluntary  Psychosocial Assessment  Patient Complaints Anxiety  Eye Contact Fair  Facial Expression Animated;Anxious;Worried  Affect Anxious;Appropriate to circumstance  Speech Logical/coherent  Interaction Assertive  Motor Activity Slow  Appearance/Hygiene Improved  Behavior Characteristics Cooperative  Mood Anxious;Pleasant;Preoccupied  Thought Process  Coherency Concrete thinking  Content Blaming others;Blaming self  Delusions Persecutory  Perception WDL  Hallucination None reported or observed  Judgment Poor  Confusion None  Danger to Self  Current suicidal ideation? Denies  Self-Injurious Behavior No self-injurious ideation or behavior indicators observed or expressed   Danger to Others  Danger to Others None reported or observed

## 2021-03-10 NOTE — Group Note (Signed)
Date:  03/10/2021 Time:  9:54 AM  Group Topic/Focus:  Goals Group:   The focus of this group is to help patients establish daily goals to achieve during treatment and discuss how the patient can incorporate goal setting into their daily lives to aide in recovery.   Participation Level:  Did Not Attend  Tyrell Antonio [facility] 03/10/2021, 9:54 AM

## 2021-03-11 ENCOUNTER — Encounter (HOSPITAL_COMMUNITY): Payer: Self-pay

## 2021-03-11 DIAGNOSIS — F332 Major depressive disorder, recurrent severe without psychotic features: Principal | ICD-10-CM

## 2021-03-11 LAB — HEMOGLOBIN A1C
Hgb A1c MFr Bld: 7.9 % — ABNORMAL HIGH (ref 4.8–5.6)
Mean Plasma Glucose: 180 mg/dL

## 2021-03-11 MED ORDER — ROSUVASTATIN CALCIUM 40 MG PO TABS
40.0000 mg | ORAL_TABLET | Freq: Every day | ORAL | 0 refills | Status: DC
Start: 2021-03-11 — End: 2021-04-02

## 2021-03-11 MED ORDER — METOPROLOL SUCCINATE ER 25 MG PO TB24
25.0000 mg | ORAL_TABLET | Freq: Every day | ORAL | 0 refills | Status: DC
Start: 2021-03-12 — End: 2021-04-02

## 2021-03-11 MED ORDER — ARIPIPRAZOLE 5 MG PO TABS: 5.0000 mg | ORAL_TABLET | Freq: Every day | ORAL | 0 refills | Status: DC

## 2021-03-11 MED ORDER — DULOXETINE HCL 30 MG PO CPEP: 30.0000 mg | ORAL_CAPSULE | Freq: Every day | ORAL | 0 refills | Status: DC

## 2021-03-11 MED ORDER — NICOTINE 21 MG/24HR TD PT24: 21.0000 mg | MEDICATED_PATCH | Freq: Every day | TRANSDERMAL | 0 refills | Status: AC

## 2021-03-11 MED ORDER — GABAPENTIN 100 MG PO CAPS
100.0000 mg | ORAL_CAPSULE | Freq: Three times a day (TID) | ORAL | 0 refills | Status: DC
Start: 2021-03-11 — End: 2021-04-01

## 2021-03-11 MED ORDER — METFORMIN HCL ER (OSM) 1000 MG PO TB24: 1000.0000 mg | ORAL_TABLET | Freq: Every day | ORAL | 0 refills | Status: DC

## 2021-03-11 NOTE — Plan of Care (Signed)
Discharge note  Patient verbalizes readiness for discharge. Follow up plan explained, AVS, Transition record and SRA given. Prescriptions and teaching provided. Belongings returned and signed for. Suicide safety plan completed and signed. Patient verbalizes understanding. Patient denies SI/HI and assures this Probation officer they will seek assistance should that change. Patient discharged to lobby to wait for [facility].   Problem: Education: Goal: Knowledge of [facility] Education information/materials will improve Outcome: Adequate for Discharge Goal: Emotional status will improve Outcome: Adequate for Discharge Goal: Mental status will improve Outcome: Adequate for Discharge Goal: Verbalization of understanding the information provided will improve 03/11/2021 1108 by Baron Sane, RN Outcome: Adequate for Discharge 03/11/2021 2355 by Baron Sane, RN Outcome: Progressing   Problem: Activity: Goal: Interest or engagement in activities will improve 03/11/2021 1108 by Baron Sane, RN Outcome: Adequate for Discharge 03/11/2021 [phone] by Baron Sane, RN Outcome: Progressing Goal: Sleeping patterns will improve 03/11/2021 1108 by Baron Sane, RN Outcome: Adequate for Discharge 03/11/2021 [phone] by Baron Sane, RN Outcome: Progressing   Problem: Coping: Goal: Ability to verbalize frustrations and anger appropriately will improve Outcome: Adequate for Discharge Goal: Ability to demonstrate self-control will improve Outcome: Adequate for Discharge   Problem: Health Behavior/Discharge Planning: Goal: Identification of resources available to assist in meeting health care needs will improve Outcome: Adequate for Discharge Goal: Compliance with treatment plan for underlying cause of condition will improve Outcome: Adequate for Discharge   Problem: Physical Regulation: Goal: Ability to maintain clinical measurements within normal limits will improve Outcome:  Adequate for Discharge   Problem: Safety: Goal: Periods of time without injury will increase Outcome: Adequate for Discharge   Problem: Education: Goal: Ability to make informed decisions regarding treatment will improve Outcome: Adequate for Discharge   Problem: Coping: Goal: Coping ability will improve Outcome: Adequate for Discharge   Problem: Health Behavior/Discharge Planning: Goal: Identification of resources available to assist in meeting health care needs will improve Outcome: Adequate for Discharge   Problem: Medication: Goal: Compliance with prescribed medication regimen will improve Outcome: Adequate for Discharge   Problem: Self-Concept: Goal: Ability to disclose and discuss suicidal ideas will improve Outcome: Adequate for Discharge Goal: Will verbalize positive feelings about self Outcome: Adequate for Discharge   Problem: Education: Goal: Knowledge of disease or condition will improve Outcome: Adequate for Discharge Goal: Understanding of discharge needs will improve Outcome: Adequate for Discharge   Problem: Health Behavior/Discharge Planning: Goal: Ability to identify changes in lifestyle to reduce recurrence of condition will improve Outcome: Adequate for Discharge Goal: Identification of resources available to assist in meeting health care needs will improve Outcome: Adequate for Discharge   Problem: Physical Regulation: Goal: Complications related to the disease process, condition or treatment will be avoided or minimized Outcome: Adequate for Discharge   Problem: Safety: Goal: Ability to remain free from injury will improve Outcome: Adequate for Discharge   Problem: Education: Goal: Utilization of techniques to improve thought processes will improve Outcome: Adequate for Discharge Goal: Knowledge of the prescribed therapeutic regimen will improve Outcome: Adequate for Discharge   Problem: Activity: Goal: Interest or engagement in leisure  activities will improve Outcome: Adequate for Discharge Goal: Imbalance in normal sleep/wake cycle will improve Outcome: Adequate for Discharge   Problem: Coping: Goal: Coping ability will improve Outcome: Adequate for Discharge Goal: Will verbalize feelings Outcome: Adequate for Discharge   Problem: Health Behavior/Discharge Planning: Goal: Ability to make decisions will improve Outcome: Adequate for Discharge Goal: Compliance with therapeutic regimen  will improve Outcome: Adequate for Discharge   Problem: Role Relationship: Goal: Will demonstrate positive changes in social behaviors and relationships Outcome: Adequate for Discharge   Problem: Safety: Goal: Ability to disclose and discuss suicidal ideas will improve Outcome: Adequate for Discharge Goal: Ability to identify and utilize support systems that promote safety will improve Outcome: Adequate for Discharge   Problem: Self-Concept: Goal: Will verbalize positive feelings about self Outcome: Adequate for Discharge Goal: Level of anxiety will decrease Outcome: Adequate for Discharge

## 2021-03-11 NOTE — BHH Suicide Risk Assessment (Signed)
[facility] Discharge Suicide Risk Assessment   Principal Problem: Major depressive disorder, recurrent, severe with psychotic features ([place]) Discharge Diagnoses: Principal Problem:   Major depressive disorder, recurrent, severe with psychotic features ([place]) Active Problems:   Post traumatic stress disorder (PTSD)   Tobacco use disorder   Alcohol dependence with perceptual disturbance (HCC)   Person feigning illness   Homelessness   Cocaine dependence, uncomplicated ([place])   Substance induced mood disorder (HCC)   Severe episode of recurrent major depressive disorder, without psychotic features ([place])   Total Time spent with patient: 30 minutes  Musculoskeletal: Strength & Muscle Tone: within normal limits Gait & Station: normal Patient leans: N/A  Psychiatric Specialty Exam  Presentation  General Appearance: Appropriate for Environment  Eye Contact:Good  Speech:Clear and Coherent; Normal Rate  Speech Volume:Normal  Handedness:Right   Mood and Affect  Mood:Depressed  Duration of Depression Symptoms: Greater than two weeks  Affect:Congruent   Thought Process  Thought Processes:Coherent  Descriptions of Associations:Intact  Orientation:Full (Time, Place and Person)  Thought Content:Logical  History of Schizophrenia/Schizoaffective disorder:No  Duration of Psychotic Symptoms:Greater than six months  Hallucinations:No data recorded Ideas of Reference:None  Suicidal Thoughts:No data recorded Homicidal Thoughts:No data recorded  Sensorium  Memory:Immediate Fair; Recent Fair; Remote Fair  Judgment:Fair  Insight:Fair   Executive Functions  Concentration:Good  Attention Span:Good  [name]  Language:Good   Psychomotor Activity  Psychomotor Activity:No data recorded  Assets  Assets:Communication Skills; Desire for Improvement; Financial Resources/Insurance; Physical Health   Sleep  Sleep:No data recorded  Physical  Exam: Physical Exam Vitals and nursing note reviewed.  Constitutional:      Appearance: Normal appearance.  HENT:     Head: Normocephalic.  Eyes:     Extraocular Movements: Extraocular movements intact.  Pulmonary:     Effort: Pulmonary effort is normal.  Musculoskeletal:     Cervical back: Normal range of motion.  Neurological:     General: No focal deficit present.     Mental Status: He is alert and oriented to person, place, and time.  Psychiatric:        Attention and Perception: Attention and perception normal.        Mood and Affect: Affect normal.        Speech: Speech normal.        Thought Content: Thought content is not paranoid or delusional. Thought content does not include homicidal ideation.        Cognition and Memory: Cognition and memory normal.        Judgment: Judgment is impulsive.     Comments: Irritable. Stated he would be suicidal if discharged but this appears to be conditional suicidality and unreliable reporting. Noted good future orientation. I do not believe patient is suicidal.  Patient noted to be smiling, joking at times, affect range unrestricted.   Review of Systems  Constitutional:  Negative for fever.  HENT:  Negative for hearing loss.   Eyes:  Negative for blurred vision.  Respiratory:  Negative for cough.   Gastrointestinal:  Negative for nausea and vomiting.  Psychiatric/Behavioral:  Positive for suicidal ideas (conditional, inconsistent).   Blood pressure 120/83, pulse 97, temperature 97.7 F (36.5 C), temperature source Oral, resp. rate 18, height _0  (1.753 m), weight 78 kg, SpO2 100 %. Body mass index is 25.4 kg/m.  Mental Status Per Nursing Assessment::   On Admission:  Suicidal ideation indicated by patient  Demographic Factors:  Male, Low socioeconomic status, and Unemployed  Loss  Factors: Financial problems/change in socioeconomic status  Historical Factors: Impulsivity  Risk Reduction Factors:   NA  Continued  Clinical Symptoms:  Alcohol/Substance Abuse/Dependencies More than one psychiatric diagnosis Previous Psychiatric Diagnoses and Treatments  Cognitive Features That Contribute To Risk:  None    Suicide Risk:  Mild:  Suicidal ideation of limited frequency, intensity, duration, and specificity.  There are no identifiable plans, no associated intent, mild dysphoria and related symptoms, good self-control (both objective and subjective assessment), few other risk factors, and identifiable protective factors, including available and accessible social support.   Follow-up Information     [facility].   Why: Please call to schedule an appointment if you wish to return to this provider for therapy and medication management services after discharge. Contact information: [place] [phone]                 Plan Of Care/Follow-up recommendations:  Activity:  as tolerated Diet:  cardiac Other:  shelter list provided   Patient has met maximum therapeutic benefit of inpatient treatment Brief intervention on substance abuse provided Patient counseled hat he can continue to seek inpatient substance abuse treatment and provided resources where he can go to do this Prescriptions for new medications provided for the patient to bridge to follow up appointment. The patient was informed that refills for these prescriptions are generally not provided, and patient is encouraged to attend all follow up appointments to address medication refills and adjustments.   Today's discharge was reviewed with treatment team, and the team is in agreement that the patient is ready for discharge. The patient is was of the discharge plan for today and has been given opportunity to ask questions. At time of discharge, the patient is determined to not be an acute danger to self, is goal directed, able to advocate for self and organizational baseline.    At discharge, the patient is instructed to:  Take all medications as prescribed. Report any adverse effects and or reactions from the medicines to her outpatient provider promptly.  Do not engage in alcohol and/or illegal drug use while on prescription medicines.  In the event of worsening symptoms, patient is instructed to call the crisis hotline, 911 and or go to the nearest ED for appropriate evaluation and treatment of symptoms.  Follow-up with primary care provider for further care of medical issues, concerns and or health care needs. * Substance abuse follow up: it is recommended that you follow up with community support treatment, like AA/NA. It is also recommended that the patient attend 90 meetings in 90 days, otherwise known as "20 in [address]"   Maida Sale, MD 03/11/2021, 9:53 AM

## 2021-03-11 NOTE — Hospital Course (Signed)
[name] is a 66 y.o. male with PPHx of  MDD, GAD, polysubstance abuse (EtOH, cocaine, tobacco), 3rd psychiatric hospitalization in 4 months, who presented to [facility] ED as a walk-in for active SI with plans to shoot himself in the head with a gun and command [place] then admitted Voluntary to [facility] for treatment of MDD with psychotic features and substance abuse exacerbated by recent cocaine abuse and housing instability.  Psychiatric diagnoses provided upon initial assessment: Principal Problem:   Major depressive disorder, recurrent, severe with psychotic features ([place]) Active Problems:   Cocaine dependence, uncomplicated (HCC)   Alcohol dependence with perceptual disturbance ([place])   Substance induced mood disorder ([place])   Person feigning illness   Post traumatic stress disorder (PTSD)   Tobacco use disorder   Homelessness   Patient's psychiatric medications were adjusted on admission:   ARIPiprazole  5 mg Oral Daily   DULoxetine  30 mg Oral Daily   gabapentin  100 mg Oral TID   metFORMIN  500 mg Oral Q breakfast   metoprolol succinate  25 mg Oral Daily   nicotine  21 mg Transdermal Daily    During the hospitalization, other adjustments were made to the patient's psychiatric medication regimen:  None  Psychiatric medications to be continued upon discharge: Same as admission meds  Events during hospitalizations: None

## 2021-03-11 NOTE — BH IP Treatment Plan (Signed)
Interdisciplinary Treatment and Diagnostic Plan Update  03/11/2021 Time of Session: 9:35am  [name] MRN: [id number]  Principal Diagnosis: Major depressive disorder, recurrent, severe with psychotic features ([place])  Secondary Diagnoses: Principal Problem:   Major depressive disorder, recurrent, severe with psychotic features ([place]) Active Problems:   Post traumatic stress disorder (PTSD)   Tobacco use disorder   Alcohol dependence with perceptual disturbance ([place])   Person feigning illness   Homelessness   Cocaine dependence, uncomplicated ([place])   Substance induced mood disorder ([place])   Severe episode of recurrent major depressive disorder, without psychotic features ([place])   Current Medications:  Current Facility-Administered Medications  Medication Dose Route Frequency Provider Last Rate Last Admin   acetaminophen (TYLENOL) tablet 650 mg  650 mg Oral Q6H PRN Revonda Humphrey, NP       alum & mag hydroxide-simeth (MAALOX/MYLANTA) 200-200-20 MG/5ML suspension 30 mL  30 mL Oral Q4H PRN Revonda Humphrey, NP       ARIPiprazole (ABILIFY) tablet 5 mg  5 mg Oral Daily Revonda Humphrey, NP   5 mg at 03/11/21 0740   DULoxetine (CYMBALTA) DR capsule 30 mg  30 mg Oral Daily Revonda Humphrey, NP   30 mg at 03/11/21 0740   gabapentin (NEURONTIN) capsule 100 mg  100 mg Oral TID Revonda Humphrey, NP   100 mg at 03/11/21 0740   hydrOXYzine (ATARAX) tablet 25 mg  25 mg Oral TID PRN Revonda Humphrey, NP   25 mg at 03/09/21 2228   OLANZapine zydis (ZYPREXA) disintegrating tablet 10 mg  10 mg Oral Q8H PRN Revonda Humphrey, NP       And   LORazepam (ATIVAN) tablet 1 mg  1 mg Oral PRN Revonda Humphrey, NP       And   ziprasidone (GEODON) injection 20 mg  20 mg Intramuscular PRN Revonda Humphrey, NP       magnesium hydroxide (MILK OF MAGNESIA) suspension 30 mL  30 mL Oral Daily PRN Revonda Humphrey, NP       metFORMIN (GLUCOPHAGE-XR) 24 hr tablet 500 mg  500 mg Oral Q breakfast  Thomes Lolling H, NP   500 mg at 03/11/21 0740   metoprolol succinate (TOPROL-XL) 24 hr tablet 25 mg  25 mg Oral Daily Thomes Lolling H, NP   25 mg at 03/11/21 0740   nicotine (NICODERM CQ - dosed in mg/24 hours) patch 21 mg  21 mg Transdermal Daily Merrily Brittle, DO   21 mg at 03/11/21 0740   traZODone (DESYREL) tablet 50 mg  50 mg Oral QHS PRN Revonda Humphrey, NP   50 mg at 03/09/21 2228   Current Outpatient Medications  Medication Sig Dispense Refill   albuterol (VENTOLIN HFA) 108 (90 Base) MCG/ACT inhaler Inhale 1-2 puffs into the lungs every 6 (six) hours as needed for wheezing or shortness of breath. 1 each 0   [START ON 03/12/2021] ARIPiprazole (ABILIFY) 5 MG tablet Take 1 tablet (5 mg total) by mouth daily. 30 tablet 0   [START ON 03/12/2021] DULoxetine (CYMBALTA) 30 MG capsule Take 1 capsule (30 mg total) by mouth daily. 30 capsule 0   gabapentin (NEURONTIN) 100 MG capsule Take 1 capsule (100 mg total) by mouth 3 (three) times daily. 90 capsule 0   metformin (FORTAMET) 1000 MG (OSM) 24 hr tablet Take 1 tablet (1,000 mg total) by mouth daily with supper. Diabetes 30 tablet 0   [START ON 03/12/2021] metoprolol succinate (TOPROL-XL) 25 MG  24 hr tablet Take 1 tablet (25 mg total) by mouth daily. 30 tablet 0   [START ON 03/12/2021] nicotine (NICODERM CQ - DOSED IN MG/24 HOURS) 21 mg/24hr patch Place 1 patch (21 mg total) onto the skin daily. 28 patch 0   rosuvastatin (CRESTOR) 40 MG tablet Take 1 tablet (40 mg total) by mouth daily. Elevated cholesterol 30 tablet 0   PTA Medications: No medications prior to admission.    Patient Stressors: Health problems   Substance abuse    Patient Strengths: Ability for insight  Average or above average intelligence  Capable of independent living  Communication skills  Motivation for treatment/growth   Treatment Modalities: Medication Management, Group therapy, Case management,  1 to 1 session with clinician, Psychoeducation, Recreational  therapy.   Physician Treatment Plan for Primary Diagnosis: Major depressive disorder, recurrent, severe with psychotic features ([place]) Long Term Goal(s):     Short Term Goals:    Medication Management: Evaluate patient's response, side effects, and tolerance of medication regimen.  Therapeutic Interventions: 1 to 1 sessions, Unit Group sessions and Medication administration.  Evaluation of Outcomes: Adequate for Discharge  Physician Treatment Plan for Secondary Diagnosis: Principal Problem:   Major depressive disorder, recurrent, severe with psychotic features ([place]) Active Problems:   Post traumatic stress disorder (PTSD)   Tobacco use disorder   Alcohol dependence with perceptual disturbance (HCC)   Person feigning illness   Homelessness   Cocaine dependence, uncomplicated ([place])   Substance induced mood disorder ([place])   Severe episode of recurrent major depressive disorder, without psychotic features ([place])  Long Term Goal(s):     Short Term Goals:       Medication Management: Evaluate patient's response, side effects, and tolerance of medication regimen.  Therapeutic Interventions: 1 to 1 sessions, Unit Group sessions and Medication administration.  Evaluation of Outcomes: Adequate for Discharge   RN Treatment Plan for Primary Diagnosis: Major depressive disorder, recurrent, severe with psychotic features ([place]) Long Term Goal(s): Knowledge of disease and therapeutic regimen to maintain health will improve  Short Term Goals: Ability to remain free from injury will improve, Ability to participate in decision making will improve, Ability to verbalize feelings will improve, Ability to disclose and discuss suicidal ideas, and Ability to identify and develop effective coping behaviors will improve  Medication Management: RN will administer medications as ordered by provider, will assess and evaluate patient's response and provide education to patient for prescribed medication. RN  will report any adverse and/or side effects to prescribing provider.  Therapeutic Interventions: 1 on 1 counseling sessions, Psychoeducation, Medication administration, Evaluate responses to treatment, Monitor vital signs and CBGs as ordered, Perform/monitor CIWA, COWS, AIMS and Fall Risk screenings as ordered, Perform wound care treatments as ordered.  Evaluation of Outcomes: Adequate for Discharge   LCSW Treatment Plan for Primary Diagnosis: Major depressive disorder, recurrent, severe with psychotic features ([place]) Long Term Goal(s): Safe transition to appropriate next level of care at discharge, Engage patient in therapeutic group addressing interpersonal concerns.  Short Term Goals: Engage patient in aftercare planning with referrals and resources, Increase social support, Increase emotional regulation, Facilitate acceptance of mental health diagnosis and concerns, Identify triggers associated with mental health/substance abuse issues, and Increase skills for wellness and recovery  Therapeutic Interventions: Assess for all discharge needs, 1 to 1 time with Social worker, Explore available resources and support systems, Assess for adequacy in community support network, Educate family and significant other(s) on suicide prevention, Complete Psychosocial Assessment, Interpersonal group therapy.  Evaluation  of Outcomes: Adequate for Discharge   Progress in Treatment: Attending groups: Yes. Participating in groups: Yes. Taking medication as prescribed: Yes. Toleration medication: Yes. Family/Significant other contact made: No, will contact:  [place]  Patient understands diagnosis: Yes. Discussing patient identified problems/goals with staff: Yes. Medical problems stabilized or resolved: Yes. Denies suicidal/homicidal ideation: Yes. Issues/concerns per patient self-inventory: No.   New problem(s) identified: No, Describe:  None   New Short Term/Long Term Goal(s): medication  stabilization, elimination of SI thoughts, development of comprehensive mental wellness plan.   Patient Goals: "To make calls and use the resources I was given"   Discharge Plan or Barriers: Pt was discharged to the [facility] and will follow up with the [facility] in [place] or at the [facility] in [place]   Reason for Continuation of Hospitalization: Medication stabilization  Estimated Length of Stay: Adequate for Discharge    Scribe for Treatment Team: Carney Harder 03/11/2021 3:11 PM

## 2021-03-11 NOTE — Assessment & Plan Note (Signed)
Has been documented in multiple admissions and dc summaries.

## 2021-03-11 NOTE — Plan of Care (Signed)
  Problem: Education: Goal: Verbalization of understanding the information provided will improve Outcome: Progressing   Problem: Activity: Goal: Interest or engagement in activities will improve Outcome: Progressing Goal: Sleeping patterns will improve Outcome: Progressing   

## 2021-03-11 NOTE — Progress Notes (Signed)
°  [facility] Adult Case Management Discharge Plan :  Will you be returning to the same living situation after discharge:  No. At discharge, do you have transportation home?: Yes,  [facility]  Do you have the ability to pay for your medications: Yes,  [employer] Benefits   Release of information consent forms completed and in the chart;  Patient's signature needed at discharge.  Patient to Follow up at:  Follow-up Information     [facility].   Why: Please call to schedule an appointment if you wish to return to this provider for therapy and medication management services after discharge. Contact information: [place] [address] [phone]                 Next level of care provider has access to [facility] and Suicide Prevention discussed: Yes,  with patient      Has patient been referred to the Quitline?: Patient refused referral  Patient has been referred for addiction treatment: Pt. refused referral  Darleen Crocker, [place] 03/11/2021, 9:53 AM

## 2021-03-11 NOTE — Progress Notes (Signed)
°   03/11/21 0500  Sleep  Number of Hours 6.75

## 2021-03-11 NOTE — [facility] Counselor (Signed)
CSW provided the Pt with a packet of information that contained a list of shelter and housing resources, free and reduced price food resources, clothing resources, a [employer], and suicide prevention information.

## 2021-03-11 NOTE — Discharge Summary (Signed)
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 prior to discharge to: Take all medications as prescribed by mental healthcare provider. Report any adverse effects and or reactions from the medicines to outpatient provider promptly. Patient has been instructed & cautioned: To not engage in alcohol and or illegal drug use while on prescription medicines. In the event of worsening symptoms, patient is instructed to call the crisis hotline, 911 and or go to the nearest ED for appropriate evaluation and treatment  of symptoms. To follow-up with primary care provider for other medical issues, concerns and or health care needs.   # The patient was evaluated each day by a clinical provider to ascertain response to treatment. Improvement was noted by the patient's report of decreasing symptoms,  improved sleep and appetite, affect, medication tolerance, behavior, and participation in unit programming.  Patient was asked each day to complete a self inventory noting mood, mental status, pain, new symptoms, anxiety and concerns.   # Patient responded well to medication and being in a therapeutic and supportive environment. Positive and appropriate behavior was noted and the patient was motivated for recovery. The patient worked closely with the treatment team and case manager to develop a discharge plan with appropriate goals. Coping skills, problem solving as well as relaxation therapies were also part of the unit programming.   # By the day of discharge patient was in much improved condition than upon admission. Symptoms were reported as significantly decreased or resolved completely. The patient denied having suicidal thoughts, intent or plan, which were explored carefully. The patient denied having homicidal thoughts. The patient denies having auditory hallucinations, visual hallucinations, paranoia, or other symptoms of psychosis. The patient reports that his mood was stable. The patient was motivated to continue taking medication with a goal of continued improvement in mental health.    Patient was discharged to SEE ABOVE with a plan to follow up as noted ABOVE.   Signed: Merrily Brittle, DO Psychiatry Resident, PGY-1 [facility] 03/11/2021, 9:10 PM

## 2021-03-12 ENCOUNTER — Encounter (HOSPITAL_COMMUNITY): Payer: Self-pay

## 2021-03-12 ENCOUNTER — Emergency Department (HOSPITAL_COMMUNITY)
Admission: EM | Admit: 2021-03-12 | Discharge: 2021-03-12 | Disposition: A | Discharge status: Home / Self Care | Payer: No Typology Code available for payment source | Attending: Emergency Medicine | Admitting: Emergency Medicine

## 2021-03-12 ENCOUNTER — Other Ambulatory Visit: Payer: Self-pay

## 2021-03-12 DIAGNOSIS — F1090 Alcohol use, unspecified, uncomplicated: Secondary | ICD-10-CM | POA: Diagnosis present

## 2021-03-12 DIAGNOSIS — F29 Unspecified psychosis not due to a substance or known physiological condition: Secondary | ICD-10-CM | POA: Diagnosis not present

## 2021-03-12 DIAGNOSIS — Z20822 Contact with and (suspected) exposure to covid-19: Secondary | ICD-10-CM | POA: Insufficient documentation

## 2021-03-12 DIAGNOSIS — R739 Hyperglycemia, unspecified: Secondary | ICD-10-CM | POA: Diagnosis not present

## 2021-03-12 DIAGNOSIS — F1290 Cannabis use, unspecified, uncomplicated: Secondary | ICD-10-CM | POA: Diagnosis not present

## 2021-03-12 DIAGNOSIS — R45851 Suicidal ideations: Secondary | ICD-10-CM | POA: Insufficient documentation

## 2021-03-12 LAB — CBC WITH DIFFERENTIAL/PLATELET
Abs Immature Granulocytes: 0.04 10*3/uL (ref 0.00–0.07)
Basophils Absolute: 0 10*3/uL (ref 0.0–0.1)
Basophils Relative: 1 %
Eosinophils Absolute: 0.1 10*3/uL (ref 0.0–0.5)
Eosinophils Relative: 1 %
HCT: 43.6 % (ref 39.0–52.0)
Hemoglobin: 14.2 g/dL (ref 13.0–17.0)
Immature Granulocytes: 1 %
Lymphocytes Relative: 21 %
Lymphs Abs: 1.4 10*3/uL (ref 0.7–4.0)
MCH: 28.4 pg (ref 26.0–34.0)
MCHC: 32.6 g/dL (ref 30.0–36.0)
MCV: 87.2 fL (ref 80.0–100.0)
Monocytes Absolute: 0.7 10*3/uL (ref 0.1–1.0)
Monocytes Relative: 12 %
Neutro Abs: 4.1 10*3/uL (ref 1.7–7.7)
Neutrophils Relative %: 64 %
Platelets: 215 10*3/uL (ref 150–400)
RBC: 5 MIL/uL (ref 4.22–5.81)
RDW: 14 % (ref 11.5–15.5)
WBC: 6.4 10*3/uL (ref 4.0–10.5)
nRBC: 0 % (ref 0.0–0.2)

## 2021-03-12 LAB — RESP PANEL BY RT-PCR (FLU A&B, COVID) ARPGX2
Influenza A by PCR: NEGATIVE
Influenza B by PCR: NEGATIVE
SARS Coronavirus 2 by RT PCR: NEGATIVE

## 2021-03-12 LAB — COMPREHENSIVE METABOLIC PANEL
ALT: 17 U/L (ref 0–44)
AST: 17 U/L (ref 15–41)
Albumin: 3.8 g/dL (ref 3.5–5.0)
Alkaline Phosphatase: 74 U/L (ref 38–126)
Anion gap: 12 (ref 5–15)
BUN: 27 mg/dL — ABNORMAL HIGH (ref 8–23)
CO2: 28 mmol/L (ref 22–32)
Calcium: 10.1 mg/dL (ref 8.9–10.3)
Chloride: 98 mmol/L (ref 98–111)
Creatinine, Ser: 1.03 mg/dL (ref 0.61–1.24)
GFR, Estimated: >60 mL/min (ref >60)
Glucose, Bld: 298 mg/dL — ABNORMAL HIGH (ref 70–99)
Potassium: 4.6 mmol/L (ref 3.5–5.1)
Sodium: 138 mmol/L (ref 135–145)
Total Bilirubin: 0.4 mg/dL (ref 0.3–1.2)
Total Protein: 7.1 g/dL (ref 6.5–8.1)

## 2021-03-12 LAB — ACETAMINOPHEN LEVEL: Acetaminophen (Tylenol), Serum: <10 ug/mL — ABNORMAL LOW (ref 10–30)

## 2021-03-12 LAB — RAPID URINE DRUG SCREEN, HOSP PERFORMED
Amphetamines: NOT DETECTED
Barbiturates: NOT DETECTED
Benzodiazepines: NOT DETECTED
Cocaine: POSITIVE — AB
Opiates: NOT DETECTED
Tetrahydrocannabinol: NOT DETECTED

## 2021-03-12 LAB — SALICYLATE LEVEL: Salicylate Lvl: <7 mg/dL — ABNORMAL LOW (ref 7.0–30.0)

## 2021-03-12 LAB — ETHANOL: Alcohol, Ethyl (B): <10 mg/dL (ref <10)
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
## 2021-03-12 NOTE — ED Triage Notes (Signed)
Pt states that he is suicidal with no plan, no HI,AVH, reports ETOH use today

## 2021-03-12 NOTE — ED Provider Notes (Signed)
[facility] EMERGENCY DEPARTMENT Provider Note   CSN: [id number] Arrival date & time: 03/12/21  0112     History  Chief Complaint  Patient presents with   Suicidal    [name] is a 66 y.o. male.  The history is provided by the patient and medical records.   66 y.o. M here with SI.  States has been feeling this way for a few days, no specific plan.  Denies HI/AVH.  Admits to some EtOH today.  Seen for same 03/06/21 and admitted to psych obs unit, discharged yesterday around 10:30AM.  Home Medications Prior to Admission medications   Medication Sig Start Date End Date Taking? Authorizing Provider  albuterol (VENTOLIN HFA) 108 (90 Base) MCG/ACT inhaler Inhale 1-2 puffs into the lungs every 6 (six) hours as needed for wheezing or shortness of breath. 12/16/20 03/06/21  Maida Sale, MD  ARIPiprazole (ABILIFY) 5 MG tablet Take 1 tablet (5 mg total) by mouth daily. 03/12/21 04/11/21  Merrily Brittle, DO  DULoxetine (CYMBALTA) 30 MG capsule Take 1 capsule (30 mg total) by mouth daily. 03/12/21 04/11/21  Merrily Brittle, DO  gabapentin (NEURONTIN) 100 MG capsule Take 1 capsule (100 mg total) by mouth 3 (three) times daily. 03/11/21 04/10/21  Merrily Brittle, DO  metformin (FORTAMET) 1000 MG (OSM) 24 hr tablet Take 1 tablet (1,000 mg total) by mouth daily with supper. Diabetes 03/11/21 04/10/21  Merrily Brittle, DO  metoprolol succinate (TOPROL-XL) 25 MG 24 hr tablet Take 1 tablet (25 mg total) by mouth daily. 03/12/21 04/11/21  Merrily Brittle, DO  nicotine (NICODERM CQ - DOSED IN MG/24 HOURS) 21 mg/24hr patch Place 1 patch (21 mg total) onto the skin daily. 03/12/21   Merrily Brittle, DO  rosuvastatin (CRESTOR) 40 MG tablet Take 1 tablet (40 mg total) by mouth daily. Elevated cholesterol 03/11/21 04/10/21  Merrily Brittle, DO      Allergies    Chlorpromazine    Review of Systems   Review of Systems  Psychiatric/Behavioral:  Positive for suicidal ideas.   All other systems  reviewed and are negative.  Physical Exam Updated Vital Signs BP (!) 151/81 (BP Location: Left Arm)    Pulse 100    Temp 98.1 F (36.7 C) (Oral)    Resp 17    SpO2 97%   Physical Exam Vitals and nursing note reviewed.  Constitutional:      Appearance: He is well-developed.  HENT:     Head: Normocephalic and atraumatic.  Eyes:     Conjunctiva/sclera: Conjunctivae normal.     Pupils: Pupils are equal, round, and reactive to light.  Cardiovascular:     Rate and Rhythm: Normal rate and regular rhythm.     Heart sounds: Normal heart sounds.  Pulmonary:     Effort: Pulmonary effort is normal.     Breath sounds: Normal breath sounds.  Abdominal:     General: Bowel sounds are normal.     Palpations: Abdomen is soft.  Musculoskeletal:        General: Normal range of motion.     Cervical back: Normal range of motion.  Skin:    General: Skin is warm and dry.  Neurological:     Mental Status: He is alert and oriented to person, place, and time.  Psychiatric:     Comments: SI without plan Denies HI/AVH    ED Results / Procedures / Treatments   Labs (all labs ordered are listed, but only abnormal results are displayed) Labs Reviewed  COMPREHENSIVE METABOLIC PANEL - Abnormal; Notable for the following components:      Result Value   Glucose, Bld 298 (*)    BUN 27 (*)    All other components within normal limits  RAPID URINE DRUG SCREEN, HOSP PERFORMED - Abnormal; Notable for the following components:   Cocaine POSITIVE (*)    All other components within normal limits  SALICYLATE LEVEL - Abnormal; Notable for the following components:   Salicylate Lvl <8.2 (*)    All other components within normal limits  ACETAMINOPHEN LEVEL - Abnormal; Notable for the following components:   Acetaminophen (Tylenol), Serum <10 (*)    All other components within normal limits  RESP PANEL BY RT-PCR (FLU A&B, COVID) ARPGX2  CBC WITH DIFFERENTIAL/PLATELET  ETHANOL    EKG None  Radiology No  results found.  Procedures Procedures    Medications Ordered in ED Medications - No data to display  ED Course/ Medical Decision Making/ A&P                           Medical Decision Making Amount and/or Complexity of Data Reviewed Labs: ordered.   66 year old male presenting to the ED with suicidal ideation.  No plan voiced.  He was seen recently for same and just discharged from psychiatric facility yesterday.  Labs were obtained and reviewed, some hyperglycemia but no findings of DKA.  His UDS is positive for cocaine.  Medically cleared.  Will get TTS evaluation.  Final Clinical Impression(s) / ED Diagnoses Final diagnoses:  Suicidal ideation    Rx / DC Orders ED Discharge Orders     None         Larene Pickett, PA-C 03/12/21 [id number]    Quintella Reichert, MD 03/12/21 [phone]

## 2021-03-12 NOTE — ED Notes (Signed)
Pt for discharge, belongings returned from locker #3, eating sandwich then up for discharge. Provided bus pass for transport home.  Pt calm, no SI/HI at this time.   Verbalizes good understanding for follow up

## 2021-03-12 NOTE — Discharge Instructions (Addendum)
Follow up with Clinic, [facility]. Please call [phone] to schedule an appointment if you wish to return to this provider for therapy and medication management services after discharge.   Discharge recommendations:  Patient is to take medications as prescribed. Please fill prescriptions provided to you on 03/11/21 at [facility].  Please see information for follow-up appointment with psychiatry and therapy. Please follow up with your primary care provider for all medical related needs.   Therapy: We recommend that patient participate in individual therapy to address mental health concerns.  Medications: The parent/guardian is to contact a medical professional and/or outpatient provider to address any new side effects that develop. Parent/guardian should update outpatient providers of any new medications and/or medication changes.   Safety:  The patient should abstain from use of illicit substances/drugs and abuse of any medications. If symptoms worsen or do not continue to improve or if the patient becomes actively suicidal or homicidal then it is recommended that the patient return to the closest hospital emergency department, the [facility], or call 911 for further evaluation and treatment. National Suicide Prevention Lifeline 1-800-SUICIDE or 912-496-4656.  About 988 988 offers 24/7 access to trained crisis counselors who can help people experiencing mental health-related distress. People can call or text 988 or chat 988lifeline.org for themselves or if they are worried about a loved one who may need crisis support.

## 2021-03-12 NOTE — ED Notes (Signed)
TTS in progress 

## 2021-03-12 NOTE — ED Notes (Signed)
Pt's belongings placed in locker 3. PT wanded by security.

## 2021-03-12 NOTE — BH Assessment (Signed)
@  0300 Clinician began to review pt's chart,  however, pt is still in the lobby at this time and will be seen by TTS at a later time and once pt is roomed.

## 2021-03-13 ENCOUNTER — Telehealth: Payer: Self-pay

## 2021-03-13 NOTE — Patient Instructions (Signed)
Visit Information  Mr. Lebaron Bautch  - as a part of your Medicaid benefit, you are eligible for care management and care coordination services at no cost or copay. I was unable to reach you by phone today but would be happy to help you with your health related needs. Please feel free to call me [phone]   A member of the Managed Medicaid care management team will reach out to you again over the next 7 days.   Mickel Fuchs, BSW, [place] Managed Medicaid Team  [phone]

## 2021-03-13 NOTE — Patient Outreach (Signed)
Care Coordination  03/13/2021  [name] 1955-12-03 [id number]  Transition Care Management Unsuccessful Follow-up Telephone Call  Date of discharge and from where:  [facility]  Attempts:  1st Attempt  Reason for unsuccessful TCM follow-up call:  Unable to reach patient

## 2021-03-23 ENCOUNTER — Telehealth (HOSPITAL_COMMUNITY): Payer: Self-pay

## 2021-03-23 NOTE — BH Assessment (Signed)
Care Management - Follow Up [facility] Discharges   Patient has been placed in an inpatient psychiatric hospital ([facility]) on 03-09-2021.

## 2021-03-29 ENCOUNTER — Other Ambulatory Visit: Payer: Self-pay

## 2021-03-29 ENCOUNTER — Emergency Department (HOSPITAL_COMMUNITY)
Admission: EM | Admit: 2021-03-29 | Discharge: 2021-03-30 | Disposition: A | Discharge status: Other Acute Inpatient Hospital | Payer: No Typology Code available for payment source | Attending: Emergency Medicine | Admitting: Emergency Medicine

## 2021-03-29 ENCOUNTER — Encounter (HOSPITAL_COMMUNITY): Payer: Self-pay | Admitting: *Deleted

## 2021-03-29 DIAGNOSIS — F1420 Cocaine dependence, uncomplicated: Secondary | ICD-10-CM | POA: Diagnosis present

## 2021-03-29 DIAGNOSIS — F32A Depression, unspecified: Secondary | ICD-10-CM | POA: Diagnosis present

## 2021-03-29 DIAGNOSIS — F1994 Other psychoactive substance use, unspecified with psychoactive substance-induced mood disorder: Secondary | ICD-10-CM | POA: Diagnosis not present

## 2021-03-29 DIAGNOSIS — F17210 Nicotine dependence, cigarettes, uncomplicated: Secondary | ICD-10-CM | POA: Insufficient documentation

## 2021-03-29 DIAGNOSIS — R45851 Suicidal ideations: Secondary | ICD-10-CM | POA: Insufficient documentation

## 2021-03-29 DIAGNOSIS — Z20822 Contact with and (suspected) exposure to covid-19: Secondary | ICD-10-CM | POA: Insufficient documentation

## 2021-03-29 DIAGNOSIS — F4310 Post-traumatic stress disorder, unspecified: Secondary | ICD-10-CM | POA: Insufficient documentation

## 2021-03-29 DIAGNOSIS — F418 Other specified anxiety disorders: Secondary | ICD-10-CM | POA: Insufficient documentation

## 2021-03-29 DIAGNOSIS — F10288 Alcohol dependence with other alcohol-induced disorder: Secondary | ICD-10-CM | POA: Insufficient documentation

## 2021-03-29 DIAGNOSIS — I10 Essential (primary) hypertension: Secondary | ICD-10-CM | POA: Insufficient documentation

## 2021-03-29 DIAGNOSIS — Z85528 Personal history of other malignant neoplasm of kidney: Secondary | ICD-10-CM | POA: Insufficient documentation

## 2021-03-29 DIAGNOSIS — E119 Type 2 diabetes mellitus without complications: Secondary | ICD-10-CM | POA: Diagnosis not present

## 2021-03-29 DIAGNOSIS — F10232 Alcohol dependence with withdrawal with perceptual disturbance: Secondary | ICD-10-CM | POA: Diagnosis present

## 2021-03-29 DIAGNOSIS — Z5900 Homelessness unspecified: Secondary | ICD-10-CM | POA: Insufficient documentation

## 2021-03-29 LAB — COMPREHENSIVE METABOLIC PANEL
ALT: 12 U/L (ref 0–44)
AST: 17 U/L (ref 15–41)
Albumin: 3.4 g/dL — ABNORMAL LOW (ref 3.5–5.0)
Alkaline Phosphatase: 55 U/L (ref 38–126)
Anion gap: 9 (ref 5–15)
BUN: 12 mg/dL (ref 8–23)
CO2: 24 mmol/L (ref 22–32)
Calcium: 9.2 mg/dL (ref 8.9–10.3)
Chloride: 106 mmol/L (ref 98–111)
Creatinine, Ser: 0.95 mg/dL (ref 0.61–1.24)
GFR, Estimated: >60 mL/min (ref >60)
Glucose, Bld: 167 mg/dL — ABNORMAL HIGH (ref 70–99)
Potassium: 3.9 mmol/L (ref 3.5–5.1)
Sodium: 139 mmol/L (ref 135–145)
Total Bilirubin: 0.7 mg/dL (ref 0.3–1.2)
Total Protein: 6.3 g/dL — ABNORMAL LOW (ref 6.5–8.1)

## 2021-03-29 LAB — RAPID URINE DRUG SCREEN, HOSP PERFORMED
Amphetamines: NOT DETECTED
Barbiturates: NOT DETECTED
Benzodiazepines: NOT DETECTED
Cocaine: POSITIVE — AB
Opiates: NOT DETECTED
Tetrahydrocannabinol: NOT DETECTED

## 2021-03-29 LAB — ACETAMINOPHEN LEVEL: Acetaminophen (Tylenol), Serum: <10 ug/mL — ABNORMAL LOW (ref 10–30)

## 2021-03-29 LAB — CBC
HCT: 43.2 % (ref 39.0–52.0)
Hemoglobin: 13.6 g/dL (ref 13.0–17.0)
MCH: 28.4 pg (ref 26.0–34.0)
MCHC: 31.5 g/dL (ref 30.0–36.0)
MCV: 90.2 fL (ref 80.0–100.0)
Platelets: 206 10*3/uL (ref 150–400)
RBC: 4.79 MIL/uL (ref 4.22–5.81)
RDW: 14.4 % (ref 11.5–15.5)
WBC: 4.6 10*3/uL (ref 4.0–10.5)
nRBC: 0 % (ref 0.0–0.2)

## 2021-03-29 LAB — ETHANOL: Alcohol, Ethyl (B): <10 mg/dL (ref <10)

## 2021-03-29 LAB — RESP PANEL BY RT-PCR (FLU A&B, COVID) ARPGX2
Influenza A by PCR: NEGATIVE
Influenza B by PCR: NEGATIVE
SARS Coronavirus 2 by RT PCR: NEGATIVE

## 2021-03-29 LAB — SALICYLATE LEVEL: Salicylate Lvl: <7 mg/dL — ABNORMAL LOW (ref 7.0–30.0)

## 2021-03-29 MED ORDER — ALBUTEROL SULFATE HFA 108 (90 BASE) MCG/ACT IN AERS: 1.0000 | INHALATION_SPRAY | Freq: Four times a day (QID) | RESPIRATORY_TRACT | Status: DC | PRN

## 2021-03-29 MED ORDER — GABAPENTIN 100 MG PO CAPS
100.0000 mg | ORAL_CAPSULE | Freq: Three times a day (TID) | ORAL | Status: DC
  Administered 2021-03-29 (×3): 100 mg via ORAL
  Filled 2021-03-29 (×3): qty 1

## 2021-03-29 MED ORDER — NICOTINE 21 MG/24HR TD PT24
21.0000 mg | MEDICATED_PATCH | Freq: Every day | TRANSDERMAL | Status: DC
  Administered 2021-03-29: 21 mg via TRANSDERMAL
  Filled 2021-03-29: qty 1

## 2021-03-29 MED ORDER — DULOXETINE HCL 30 MG PO CPEP
30.0000 mg | ORAL_CAPSULE | Freq: Every day | ORAL | Status: DC
  Administered 2021-03-29: 30 mg via ORAL
  Filled 2021-03-29: qty 1

## 2021-03-29 MED ORDER — METOPROLOL SUCCINATE ER 25 MG PO TB24
25.0000 mg | ORAL_TABLET | Freq: Every day | ORAL | Status: DC
  Administered 2021-03-29: 25 mg via ORAL
  Filled 2021-03-29: qty 1

## 2021-03-29 MED ORDER — ROSUVASTATIN CALCIUM 5 MG PO TABS
40.0000 mg | ORAL_TABLET | Freq: Every day | ORAL | Status: DC
  Administered 2021-03-29: 40 mg via ORAL
  Filled 2021-03-29: qty 2

## 2021-03-29 MED ORDER — METFORMIN HCL ER 500 MG PO TB24
1000.0000 mg | ORAL_TABLET | Freq: Every day | ORAL | Status: DC
  Administered 2021-03-29: 1000 mg via ORAL
  Filled 2021-03-29: qty 2

## 2021-03-29 MED ORDER — ARIPIPRAZOLE 5 MG PO TABS
5.0000 mg | ORAL_TABLET | Freq: Every day | ORAL | Status: DC
  Administered 2021-03-29: 5 mg via ORAL
  Filled 2021-03-29: qty 1

## 2021-03-29 NOTE — ED Notes (Signed)
Pt up and ambulated to the bathroom and back to room. Pt asked for crackers and peanut butter, spite. Pt given these items since he slept through snack time earlier in the night. No acute changes noted. Will continue to monitor.

## 2021-03-29 NOTE — ED Notes (Signed)
Pt placed in R36 for TTS consult

## 2021-03-29 NOTE — ED Notes (Signed)
Regular lunch tray ordered 

## 2021-03-29 NOTE — ED Triage Notes (Signed)
Pt reports SI for the past several days with plan to shoot himself. Reports "my life just isn't going the way I want it to." Reports noncompliance with medications

## 2021-03-29 NOTE — Progress Notes (Addendum)
Per Merlyn Lot, NP pt meets inpatient criteria and unable to contract for safety.  CSW communication with [facility] Recommendation:   -At this time there are no opening within the [facility] system for Inpatient Behavioral Health Placement: CSW called the following:  [facility] [place] [phone] [facility] [place] [phone] Ext: [phone] ---[place] not on diversion but no openings advised to check tomorrow. [place] [phone] [place]  [place] [phone] [facility] on Diversion     CSW requested that [facility] review pt for pending availability. CSW will assist and follow with placement.   Benjaman Kindler, MSW, LCSWA 03/29/2021 12:00 PM

## 2021-03-29 NOTE — Consult Note (Signed)
Telepsych Consultation   Reason for Consult:  Psychiatric Reassessment  Referring Physician:  Dr. Gerlene Fee Location of Patient:    [name] Location of Provider: Other: virtual home office  Patient Identification: Moses Odoherty MRN:  [id number] Principal Diagnosis: Substance induced mood disorder ([place]) Diagnosis:  Principal Problem:   Substance induced mood disorder ([place]) Active Problems:   Post traumatic stress disorder (PTSD)   Alcohol dependence with perceptual disturbance ([place])   Cocaine dependence, uncomplicated ([place])   Total Time spent with patient: 30 minutes  Subjective:   Jarmel Linhardt is a 66 y.o. male patient admitted with worsening suicidal ideations and inability to contract for safety. Today he states, "I am really going to kill myself if I go home."   HPI:   Patient seen via telepsych by this provider; chart reviewed and consulted with Dr. Dwyane Dee on 03/29/21.  On evaluation Emrick Hensch reports worsening suicidal ideations triggered by chronic drug abuse and psychosocial stressors.  He has chronic suicidal ideations, usually worse with drug and alcohol use but once he clears up, usually contracts for safety and able to be discharged to continue follow-up/treatment with the [facility].  Today he is clear and coherent, no longer acutely intoxicated but continues to report suicidal ideations with a plan to run into traffic or poison himself.  Pt has been seen many times over the past 2 months with similar presentation but insists, "this time is different." He cannot contract for safety.    Per ED provider, Labs reveal largely unremarkable blood counts and electrolytes, UDS is positive for cocaine.  He is medically cleared. Last EKG completed 03/12/2021.    Chief Complaint              Suicidal   History of Present Illness              Jlynn Langille is a 66 y.o. year-old male with a history of polysubstance abuse, diabetes presenting to the ED with  chief complaint of suicidal ideation.   Feeling more depressed than normal over the past few days, having serious thoughts of wanting to kill himself.  Plan is to shoot himself in the head.  Does not own a gun but says he would be easily able to get one.  Admits to alcohol and cocaine recently.   Past Psychiatric History: as outlined above  Risk to Self:  yes Risk to Others:  no Prior Inpatient Therapy: yes  Prior Outpatient Therapy:  yes, currently enrolled with Dept of [facility]  Past Medical History:  Past Medical History:  Diagnosis Date   Anxiety    Arthritis    Cancer ([place])    CARCINOMA, RENAL CELL 06/05/2008   Qualifier: Diagnosis of  By: Burnett Kanaris     Cataract    Cocaine dependence, uncomplicated ([place]) 63/02/6008   Depression    Diabetes mellitus    Disorder of kidney and ureter 12/16/2020   Jan 18, 2009 Entered By: Lorelee Cover A Comment: L nephrectomy, kidney cancer  2007   Elevated prostate specific antigen (PSA) 12/16/2020   GERD (gastroesophageal reflux disease)    H/O suicide attempt 1997 and 2011   jumped out of moving vehicle and attempted to jump into moving traffic   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   As noted on 01/04/20 CT: "1 cm low-density in the lateral aspect of the right lobe of liver"   High cholesterol    Hypertension    Legal circumstance 12/16/2020   Multiple nodules of lung  12/16/2020   Neuropathic pain    Osteoporosis    Person feigning illness 12/16/2020   Sep 06, 2007 Entered By: Celine Mans Comment: noted in several discharge summaries   S/P coronary artery stent placement (DES to LCx) 10/11/2020    Past Surgical History:  Procedure Laterality Date   COLONOSCOPY     CORONARY STENT INTERVENTION N/A 10/10/2020   Procedure: CORONARY STENT INTERVENTION;  Surgeon: Jettie Booze, MD;  Location: [facility];  Service: Cardiovascular;  Laterality: N/A;   LEFT HEART CATH AND CORONARY ANGIOGRAPHY N/A 10/10/2020   Procedure: LEFT  HEART CATH AND CORONARY ANGIOGRAPHY;  Surgeon: Jettie Booze, MD;  Location: [facility];  Service: Cardiovascular;  Laterality: N/A;   NEPHRECTOMY     due to cancer   RIGHT/LEFT HEART CATH AND CORONARY ANGIOGRAPHY N/A 10/09/2020   Procedure: RIGHT/LEFT HEART CATH AND CORONARY ANGIOGRAPHY;  Surgeon: Nelva Bush, MD;  Location: [facility];  Service: Cardiovascular;  Laterality: N/A;   Family History:  Family History  Problem Relation Age of Onset   Alcohol abuse Sister    Alcohol abuse Brother    Colon cancer Neg Hx    Esophageal cancer Neg Hx    Pancreatic cancer Neg Hx    Rectal cancer Neg Hx    Stomach cancer Neg Hx    Family Psychiatric  History: unknown Social History:  Social History   Substance and Sexual Activity  Alcohol Use Yes   Alcohol/week: 3.0 standard drinks   Types: 3 Cans of beer per week   Comment: Pt has been clean for 8 months     Social History   Substance and Sexual Activity  Drug Use Yes   Types: "Crack" cocaine, Cocaine    Social History   Socioeconomic History   Marital status: Single    Spouse name: Not on file   Number of children: Not on file   Years of education: Not on file   Highest education level: Not on file  Occupational History   Not on file  Tobacco Use   Smoking status: Every Day    Packs/day: 1.00    Years: 20.00    Pack years: 20.00    Types: Cigarettes   Smokeless tobacco: Never  Vaping Use   Vaping Use: Never used  Substance and Sexual Activity   Alcohol use: Yes    Alcohol/week: 3.0 standard drinks    Types: 3 Cans of beer per week    Comment: Pt has been clean for 8 months   Drug use: Yes    Types: "Crack" cocaine, Cocaine   Sexual activity: Yes    Birth control/protection: Condom  Other Topics Concern   Not on file  Social History Narrative   Lives alone.  Has sister in area.   Social Determinants of Health   Financial Resource Strain: Not on file  Food Insecurity: Not on file   Transportation Needs: Not on file  Physical Activity: Not on file  Stress: Not on file  Social Connections: Not on file   Additional Social History:    Allergies:   Allergies  Allergen Reactions   Chlorpromazine Shortness Of Breath    Other reaction(s): IRREGULAR HEART RATE    Labs:  Results for orders placed or performed during the hospital encounter of 03/29/21 (from the past 48 hour(s))  Comprehensive metabolic panel     Status: Abnormal   Collection Time: 03/29/21  1:35 AM  Result Value Ref Range  Sodium 139 135 - 145 mmol/L   Potassium 3.9 3.5 - 5.1 mmol/L   Chloride 106 98 - 111 mmol/L   CO2 24 22 - 32 mmol/L   Glucose, Bld 167 (H) 70 - 99 mg/dL    Comment: Glucose reference range applies only to samples taken after fasting for at least 8 hours.   BUN 12 8 - 23 mg/dL   Creatinine, Ser 0.95 0.61 - 1.24 mg/dL   Calcium 9.2 8.9 - 10.3 mg/dL   Total Protein 6.3 (L) 6.5 - 8.1 g/dL   Albumin 3.4 (L) 3.5 - 5.0 g/dL   AST 17 15 - 41 U/L   ALT 12 0 - 44 U/L   Alkaline Phosphatase 55 38 - 126 U/L   Total Bilirubin 0.7 0.3 - 1.2 mg/dL   GFR, Estimated >60 >60 mL/min    Comment: (NOTE) Calculated using the CKD-EPI Creatinine Equation (2021)    Anion gap 9 5 - 15    Comment: Performed at [facility], [place] [address] [place], [place] [address]  Ethanol     Status: None   Collection Time: 03/29/21  1:35 AM  Result Value Ref Range   Alcohol, Ethyl (B) <10 <10 mg/dL    Comment: (NOTE) Lowest detectable limit for serum alcohol is 10 mg/dL.  For medical purposes only. Performed at [facility], [place] [address], [place], [place] [address]   Salicylate level     Status: Abnormal   Collection Time: 03/29/21  1:35 AM  Result Value Ref Range   Salicylate Lvl <2.0 (L) 7.0 - 30.0 mg/dL    Comment: Performed at [facility] [address] [place], [place] [address]  Acetaminophen level     Status: Abnormal   Collection Time: 03/29/21  1:35 AM  Result Value  Ref Range   Acetaminophen (Tylenol), Serum <10 (L) 10 - 30 ug/mL    Comment: (NOTE) Therapeutic concentrations vary significantly. A range of 10-30 ug/mL  may be an effective concentration for many patients. However, some  are best treated at concentrations outside of this range. Acetaminophen concentrations >150 ug/mL at 4 hours after ingestion  and >50 ug/mL at 12 hours after ingestion are often associated with  toxic reactions.  Performed at [facility], [place] [address], [place], [place] [address]   cbc     Status: None   Collection Time: 03/29/21  1:35 AM  Result Value Ref Range   WBC 4.6 4.0 - 10.5 K/uL   RBC 4.79 4.22 - 5.81 MIL/uL   Hemoglobin 13.6 13.0 - 17.0 g/dL   HCT 43.2 39.0 - 52.0 %   MCV 90.2 80.0 - 100.0 fL   MCH 28.4 26.0 - 34.0 pg   MCHC 31.5 30.0 - 36.0 g/dL   RDW 14.4 11.5 - 15.5 %   Platelets 206 150 - 400 K/uL   nRBC 0.0 0.0 - 0.2 %    Comment: Performed at [facility], [place] [address], [place], [place] [address]  Rapid urine drug screen (hospital performed)     Status: Abnormal   Collection Time: 03/29/21  1:42 AM  Result Value Ref Range   Opiates NONE DETECTED NONE DETECTED   Cocaine POSITIVE (A) NONE DETECTED   Benzodiazepines NONE DETECTED NONE DETECTED   Amphetamines NONE DETECTED NONE DETECTED   Tetrahydrocannabinol NONE DETECTED NONE DETECTED   Barbiturates NONE DETECTED NONE DETECTED    Comment: (NOTE) DRUG SCREEN FOR MEDICAL PURPOSES ONLY.  IF CONFIRMATION IS NEEDED FOR ANY PURPOSE, NOTIFY LAB WITHIN 5 DAYS.  LOWEST DETECTABLE LIMITS FOR URINE DRUG SCREEN Drug Class                     Cutoff (ng/mL) Amphetamine and metabolites    1000 Barbiturate and metabolites    200 Benzodiazepine                 400 Tricyclics and metabolites     300 Opiates and metabolites        300 Cocaine and metabolites        300 THC                            50 Performed at [facility], [place] [address] [place], [place] [address]    Resp Panel by RT-PCR (Flu A&B, Covid) Nasopharyngeal Swab     Status: None   Collection Time: 03/29/21  2:49 AM   Specimen: Nasopharyngeal Swab; Nasopharyngeal(NP) swabs in vial transport medium  Result Value Ref Range   SARS Coronavirus 2 by RT PCR NEGATIVE NEGATIVE    Comment: (NOTE) SARS-CoV-2 target nucleic acids are NOT DETECTED.  The SARS-CoV-2 RNA is generally detectable in upper respiratory specimens during the acute phase of infection. The lowest concentration of SARS-CoV-2 viral copies this assay can detect is 138 copies/mL. A negative result does not preclude SARS-Cov-2 infection and should not be used as the sole basis for treatment or other patient management decisions. A negative result may occur with  improper specimen collection/handling, submission of specimen other than nasopharyngeal swab, presence of viral mutation(s) within the areas targeted by this assay, and inadequate number of viral copies(<138 copies/mL). A negative result must be combined with clinical observations, patient history, and epidemiological information. The expected result is Negative.  Fact Sheet for Patients:  EntrepreneurPulse.com.au  Fact Sheet for Healthcare Providers:  IncredibleEmployment.be  This test is no t yet approved or cleared by the [place] FDA and  has been authorized for detection and/or diagnosis of SARS-CoV-2 by FDA under an Emergency Use Authorization (EUA). This EUA will remain  in effect (meaning this test can be used) for the duration of the COVID-19 declaration under Section 564(b)(1) of the Act, 21 U.S.C.section 360bbb-3(b)(1), unless the authorization is terminated  or revoked sooner.       Influenza A by PCR NEGATIVE NEGATIVE   Influenza B by PCR NEGATIVE NEGATIVE    Comment: (NOTE) The Xpert Xpress SARS-CoV-2/FLU/RSV plus assay is intended as an aid in the diagnosis of influenza from Nasopharyngeal swab specimens  and should not be used as a sole basis for treatment. Nasal washings and aspirates are unacceptable for Xpert Xpress SARS-CoV-2/FLU/RSV testing.  Fact Sheet for Patients: EntrepreneurPulse.com.au  Fact Sheet for Healthcare Providers: IncredibleEmployment.be  This test is not yet approved or cleared by the [place] FDA and has been authorized for detection and/or diagnosis of SARS-CoV-2 by FDA under an Emergency Use Authorization (EUA). This EUA will remain in effect (meaning this test can be used) for the duration of the COVID-19 declaration under Section 564(b)(1) of the Act, 21 U.S.C. section 360bbb-3(b)(1), unless the authorization is terminated or revoked.  Performed at [facility], [place] [address], [place], [place] [address]     Medications:  Current Facility-Administered Medications  Medication Dose Route Frequency Provider Last Rate Last Admin   albuterol (VENTOLIN HFA) 108 (90 Base) MCG/ACT inhaler  1-2 puff  1-2 puff Inhalation Q6H PRN Dorie Rank, MD       ARIPiprazole (ABILIFY) tablet 5 mg  5 mg Oral Daily Dorie Rank, MD       DULoxetine (CYMBALTA) DR capsule 30 mg  30 mg Oral Daily Dorie Rank, MD       gabapentin (NEURONTIN) capsule 100 mg  100 mg Oral TID Dorie Rank, MD       metFORMIN (GLUCOPHAGE-XR) 24 hr tablet 1,000 mg  1,000 mg Oral Q supper Dorie Rank, MD       metoprolol succinate (TOPROL-XL) 24 hr tablet 25 mg  25 mg Oral Daily Dorie Rank, MD       nicotine (NICODERM CQ - dosed in mg/24 hours) patch 21 mg  21 mg Transdermal Daily Dorie Rank, MD       rosuvastatin (CRESTOR) tablet 40 mg  40 mg Oral Daily Dorie Rank, MD       Current Outpatient Medications  Medication Sig Dispense Refill   albuterol (VENTOLIN HFA) 108 (90 Base) MCG/ACT inhaler Inhale 1-2 puffs into the lungs every 6 (six) hours as needed for wheezing or shortness of breath. 1 each 0   ARIPiprazole (ABILIFY) 5 MG tablet Take 1 tablet (5 mg total) by  mouth daily. 30 tablet 0   DULoxetine (CYMBALTA) 30 MG capsule Take 1 capsule (30 mg total) by mouth daily. 30 capsule 0   gabapentin (NEURONTIN) 100 MG capsule Take 1 capsule (100 mg total) by mouth 3 (three) times daily. 90 capsule 0   metformin (FORTAMET) 1000 MG (OSM) 24 hr tablet Take 1 tablet (1,000 mg total) by mouth daily with supper. Diabetes 30 tablet 0   metoprolol succinate (TOPROL-XL) 25 MG 24 hr tablet Take 1 tablet (25 mg total) by mouth daily. 30 tablet 0   nicotine (NICODERM CQ - DOSED IN MG/24 HOURS) 21 mg/24hr patch Place 1 patch (21 mg total) onto the skin daily. 28 patch 0   rosuvastatin (CRESTOR) 40 MG tablet Take 1 tablet (40 mg total) by mouth daily. Elevated cholesterol 30 tablet 0    Musculoskeletal: Strength & Muscle Tone: within normal limits Gait & Station: normal Patient leans: N/A   Psychiatric Specialty Exam:  Presentation  General Appearance: Casual  Eye Contact:Good  Speech:Clear and Coherent  Speech Volume:Decreased  Handedness:Right   Mood and Affect  Mood:Depressed; Hopeless; Worthless  Affect:Congruent; Depressed   Social worker Processes:Coherent  Descriptions of Associations:Intact  Orientation:Full (Time, Place and Person)  Thought Content:Rumination; Illogical  History of Schizophrenia/Schizoaffective disorder:Yes  Duration of Psychotic Symptoms:Greater than six months  Hallucinations:denies Ideas of Reference:None  Suicidal Thoughts:Suicidal Thoughts: Yes, Active SI Active Intent and/or Plan: With Intent; With Plan; With Means to Carry Out  Homicidal Thoughts:Homicidal Thoughts: No   Sensorium  Memory:Immediate Good; Recent Good; Remote Good  Judgment:Poor  Insight:Poor   Executive Functions  Concentration:Fair  Attention Span:Fair  [place]  Language:Good   Psychomotor Activity  Psychomotor Activity:Psychomotor Activity: Normal   Assets   Assets:Communication Skills; Social Support   Sleep  Sleep:Sleep: Good Number of Hours of Sleep: 7    Physical Exam: Physical Exam Constitutional:      Appearance: Normal appearance.  Cardiovascular:     Rate and Rhythm: Normal rate.     Pulses: Normal pulses.  Pulmonary:     Effort: Pulmonary effort is normal.  Musculoskeletal:     Cervical back: Normal range of motion.  Neurological:     General: No  focal deficit present.     Mental Status: He is alert and oriented to person, place, and time.  Psychiatric:        Attention and Perception: Attention normal.        Mood and Affect: Mood is anxious and depressed.        Speech: Speech normal.        Behavior: Behavior is cooperative.        Thought Content: Thought content is not paranoid or delusional. Thought content includes suicidal ideation. Thought content does not include homicidal ideation. Thought content includes suicidal plan. Thought content does not include homicidal plan.        Cognition and Memory: Cognition and memory normal.        Judgment: Judgment is impulsive and inappropriate.   Review of Systems  Constitutional: Negative.   HENT: Negative.    Eyes: Negative.   Respiratory: Negative.    Cardiovascular: Negative.   Gastrointestinal: Negative.   Genitourinary: Negative.   Musculoskeletal: Negative.   Skin: Negative.   Neurological:  Negative for dizziness, tremors and headaches.  Endo/Heme/Allergies: Negative.   Psychiatric/Behavioral:  Positive for depression, substance abuse and suicidal ideas. Negative for hallucinations. The patient is nervous/anxious. The patient does not have insomnia.   Blood pressure (!) 146/80, pulse 90, temperature 99 F (37.2 C), temperature source Oral, resp. rate 16, SpO2 98 %. There is no height or weight on file to calculate BMI.  Treatment Plan Summary: Daily contact with patient to assess and evaluate symptoms and progress in treatment and Medication  management  Patient with chronic suicidal ideations and drug abuse, endorses SI with plan to run into traffic or poison himself.  He will not contract for safety.  He is already established with the [place]. SW will reach out to check for bed availability. In the interim, recommend restarting home medications and obtaining updated EKG.   Above discussed with patient concordance.    Disposition: Recommend psychiatric Inpatient admission when medically cleared.  This service was provided via telemedicine using a 2-way, interactive audio and video technology.  Names of all persons participating in this telemedicine service and their role in this encounter. Name: [name] Role: Patient  Name: Merlyn Lot Role: PMHNP    Mallie Darting, NP 03/29/2021 11:59 AM

## 2021-03-29 NOTE — ED Provider Notes (Signed)
[facility] Emergency Department Provider Note MRN:  [id number]  Arrival date & time: 03/29/21     Chief Complaint   Suicidal   History of Present Illness   [name] is a 66 y.o. year-old male with a history of polysubstance abuse, diabetes presenting to the ED with chief complaint of suicidal ideation.  Feeling more depressed than normal over the past few days, having serious thoughts of wanting to kill himself.  Plan is to shoot himself in the head.  Does not own a gun but says he would be easily able to get one.  Admits to alcohol and cocaine recently.  Review of Systems  A thorough review of systems was obtained and all systems are negative except as noted in the HPI and PMH.   Patient's Health History    Past Medical History:  Diagnosis Date   Anxiety    Arthritis    Cancer ([place])    CARCINOMA, RENAL CELL 06/05/2008   Qualifier: Diagnosis of  By: Burnett Kanaris     Cataract    Cocaine dependence, uncomplicated ([place]) 26/94/8546   Depression    Diabetes mellitus    Disorder of kidney and ureter 12/16/2020   Jan 18, 2009 Entered By: Lorelee Cover A Comment: L nephrectomy, kidney cancer  2007   Elevated prostate specific antigen (PSA) 12/16/2020   GERD (gastroesophageal reflux disease)    H/O suicide attempt 1997 and 2011   jumped out of moving vehicle and attempted to jump into moving traffic   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   As noted on 01/04/20 CT: "1 cm low-density in the lateral aspect of the right lobe of liver"   High cholesterol    Hypertension    Legal circumstance 12/16/2020   Multiple nodules of lung 12/16/2020   Neuropathic pain    Osteoporosis    Person feigning illness 12/16/2020   Sep 06, 2007 Entered By: Celine Mans Comment: noted in several discharge summaries   S/P coronary artery stent placement (DES to LCx) 10/11/2020    Past Surgical History:  Procedure Laterality Date   COLONOSCOPY     CORONARY STENT INTERVENTION N/A  10/10/2020   Procedure: CORONARY STENT INTERVENTION;  Surgeon: Jettie Booze, MD;  Location: [facility];  Service: Cardiovascular;  Laterality: N/A;   LEFT HEART CATH AND CORONARY ANGIOGRAPHY N/A 10/10/2020   Procedure: LEFT HEART CATH AND CORONARY ANGIOGRAPHY;  Surgeon: Jettie Booze, MD;  Location: [facility];  Service: Cardiovascular;  Laterality: N/A;   NEPHRECTOMY     due to cancer   RIGHT/LEFT HEART CATH AND CORONARY ANGIOGRAPHY N/A 10/09/2020   Procedure: RIGHT/LEFT HEART CATH AND CORONARY ANGIOGRAPHY;  Surgeon: Nelva Bush, MD;  Location: [facility];  Service: Cardiovascular;  Laterality: N/A;    Family History  Problem Relation Age of Onset   Alcohol abuse Sister    Alcohol abuse Brother    Colon cancer Neg Hx    Esophageal cancer Neg Hx    Pancreatic cancer Neg Hx    Rectal cancer Neg Hx    Stomach cancer Neg Hx     Social History   Socioeconomic History   Marital status: Single    Spouse name: Not on file   Number of children: Not on file   Years of education: Not on file   Highest education level: Not on file  Occupational History   Not on file  Tobacco Use   Smoking status: Every Day  Packs/day: 1.00    Years: 20.00    Pack years: 20.00    Types: Cigarettes   Smokeless tobacco: Never  Vaping Use   Vaping Use: Never used  Substance and Sexual Activity   Alcohol use: Yes    Alcohol/week: 3.0 standard drinks    Types: 3 Cans of beer per week    Comment: Pt has been clean for 8 months   Drug use: Yes    Types: "Crack" cocaine, Cocaine   Sexual activity: Yes    Birth control/protection: Condom  Other Topics Concern   Not on file  Social History Narrative   Lives alone.  Has sister in area.   Social Determinants of Health   Financial Resource Strain: Not on file  Food Insecurity: Not on file  Transportation Needs: Not on file  Physical Activity: Not on file  Stress: Not on file  Social Connections: Not on file   Intimate Partner Violence: Not on file     Physical Exam   Vitals:   03/29/21 0127  BP: (!) 147/80  Pulse: 97  Resp: 17  Temp: 98.1 F (36.7 C)  SpO2: 98%    CONSTITUTIONAL: Well-appearing, NAD NEURO/PSYCH:  Alert and oriented x 3, no focal deficits EYES:  eyes equal and reactive ENT/NECK:  no LAD, no JVD CARDIO: Regular rate, well-perfused, normal S1 and S2 PULM:  CTAB no wheezing or rhonchi GI/GU:  non-distended, non-tender MSK/SPINE:  No gross deformities, no edema SKIN:  no rash, atraumatic   *Additional and/or pertinent findings included in MDM below  Diagnostic and Interventional Summary    EKG Interpretation  Date/Time:    Ventricular Rate:    PR Interval:    QRS Duration:   QT Interval:    QTC Calculation:   R Axis:     Text Interpretation:         Labs Reviewed  COMPREHENSIVE METABOLIC PANEL - Abnormal; Notable for the following components:      Result Value   Glucose, Bld 167 (*)    Total Protein 6.3 (*)    Albumin 3.4 (*)    All other components within normal limits  SALICYLATE LEVEL - Abnormal; Notable for the following components:   Salicylate Lvl <7.6 (*)    All other components within normal limits  ACETAMINOPHEN LEVEL - Abnormal; Notable for the following components:   Acetaminophen (Tylenol), Serum <10 (*)    All other components within normal limits  RAPID URINE DRUG SCREEN, HOSP PERFORMED - Abnormal; Notable for the following components:   Cocaine POSITIVE (*)    All other components within normal limits  RESP PANEL BY RT-PCR (FLU A&B, COVID) [id number]  ETHANOL  CBC    No orders to display    Medications - No data to display   Procedures  /  Critical Care Procedures  ED Course and Medical Decision Making  Initial Impression and Ddx Suicidal ideation endorsed, question major depressive disorder versus malingering for shelter given patient's homelessness.  Frequent ED visits and psychiatric admissions recently.  Medically  cleared, awaiting TTS evaluation.  Past medical/surgical history that increases complexity of ED encounter: Polysubstance abuse  Interpretation of Diagnostics Labs reveal largely unremarkable blood counts and electrolytes, UDS is positive for cocaine  Patient Reassessment and Ultimate Disposition/Management Awaiting ultimate disposition after TTS evaluation, signed out to default provider.  Patient management required discussion with the following services or consulting groups:  Psychiatry/TTS  Complexity of Problems Addressed Acute illness or injury that poses threat of  life of bodily function  Additional Data Reviewed and Analyzed Further history obtained from: Recent Consult notes  Additional Factors Impacting ED Encounter Risk Consideration of hospitalization  Barth Kirks. Sedonia Small, [place] [email or web address]  Final Clinical Impressions(s) / ED Diagnoses     ICD-10-CM   1. Suicidal ideation  R45.851       ED Discharge Orders     None        Discharge Instructions Discussed with and Provided to Patient:   Discharge Instructions   None      Maudie Flakes, MD 03/29/21 [phone]
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
## 2021-03-29 NOTE — ED Provider Notes (Signed)
Emergency Medicine Observation Re-evaluation Note  Lorraine Terriquez is a 66 y.o. male, seen on rounds today.  Pt initially presented to the ED for complaints of Suicidal and Homeless Currently, the patient is resting in the ED.  Physical Exam  BP (!) 146/80 (BP Location: Left Arm)    Pulse 90    Temp 99 F (37.2 C) (Oral)    Resp 16    SpO2 98%  Physical Exam General: sleeping, calm Cardiac: regular rate Lungs: normal resp effort Psych: deferred  ED Course / MDM  EKG:   I have reviewed the labs performed to date as well as medications administered while in observation.  Recent changes in the last 24 hours include home meds ordered and pt has been seen by TTS.  Plan  Current plan is for waiting for TTS recommendations.[name] is not under involuntary commitment.     Dorie Rank, MD 03/29/21 1152

## 2021-03-29 NOTE — Progress Notes (Signed)
Inpatient Behavioral Health Placement  Pt meets inpatient criteria per Merlyn Lot, NP. Pt is [facility] however there is no bed no facilities within the [place] are on Diversion. Per Adalberto Ill, RN [facility] there are no openings appropriate at [facility] for pt. Referral was sent to the following facilities;   Destination Service Provider Address Phone Fax  [place] [place] [address] [phone] [facility]  [address] [place] [address] [phone] [facility]  [place], [place] [address] [place]  [facility]  [address] [place] [address] [phone]  [facility]  [place], [place] [address] [phone] [facility]  [phone] [place] [address] [phone] [facility]  [place] [address] [place] [address] [phone]  [facility]  [address] [place] [address] [phone]  [facility]  [address] [place] [address] [phone] [place], [place] [address] [phone] [facility]  [address] [place] [address] [phone]  [facility]  [address] [place] [address] [phone] [facility]  [place], [place] [address] [phone]  [facility]  [address] [place] [address] [place]  [facility]  [address], [place] [address] [phone]  [place] [place]  [place] [address] [phone]  [place]        Situation ongoing,  CSW will follow up.   Benjaman Kindler, MSW, LCSWA 03/29/2021  @ 5:27 PM

## 2021-03-30 ENCOUNTER — Encounter (HOSPITAL_COMMUNITY): Payer: Self-pay

## 2021-03-30 ENCOUNTER — Other Ambulatory Visit: Payer: Self-pay

## 2021-03-30 ENCOUNTER — Inpatient Hospital Stay (HOSPITAL_COMMUNITY)
Admission: AD | Admit: 2021-03-30 | Discharge: 2021-04-02 | DRG: 885 | Payer: No Typology Code available for payment source | Source: Intra-hospital | Attending: Psychiatry | Admitting: Psychiatry

## 2021-03-30 ENCOUNTER — Encounter (HOSPITAL_COMMUNITY): Payer: Self-pay | Admitting: Psychiatry

## 2021-03-30 DIAGNOSIS — E119 Type 2 diabetes mellitus without complications: Secondary | ICD-10-CM | POA: Diagnosis present

## 2021-03-30 DIAGNOSIS — F1994 Other psychoactive substance use, unspecified with psychoactive substance-induced mood disorder: Secondary | ICD-10-CM

## 2021-03-30 DIAGNOSIS — I471 Supraventricular tachycardia: Secondary | ICD-10-CM | POA: Diagnosis present

## 2021-03-30 DIAGNOSIS — F333 Major depressive disorder, recurrent, severe with psychotic symptoms: Secondary | ICD-10-CM | POA: Diagnosis not present

## 2021-03-30 DIAGNOSIS — F332 Major depressive disorder, recurrent severe without psychotic features: Secondary | ICD-10-CM | POA: Diagnosis present

## 2021-03-30 DIAGNOSIS — I509 Heart failure, unspecified: Secondary | ICD-10-CM | POA: Diagnosis present

## 2021-03-30 DIAGNOSIS — J45909 Unspecified asthma, uncomplicated: Secondary | ICD-10-CM | POA: Diagnosis present

## 2021-03-30 DIAGNOSIS — Z955 Presence of coronary angioplasty implant and graft: Secondary | ICD-10-CM | POA: Diagnosis not present

## 2021-03-30 DIAGNOSIS — Z7984 Long term (current) use of oral hypoglycemic drugs: Secondary | ICD-10-CM

## 2021-03-30 DIAGNOSIS — I2510 Atherosclerotic heart disease of native coronary artery without angina pectoris: Secondary | ICD-10-CM | POA: Diagnosis present

## 2021-03-30 DIAGNOSIS — F1420 Cocaine dependence, uncomplicated: Secondary | ICD-10-CM | POA: Diagnosis present

## 2021-03-30 DIAGNOSIS — Z20822 Contact with and (suspected) exposure to covid-19: Secondary | ICD-10-CM | POA: Diagnosis present

## 2021-03-30 DIAGNOSIS — R45851 Suicidal ideations: Secondary | ICD-10-CM | POA: Diagnosis present

## 2021-03-30 DIAGNOSIS — Z79899 Other long term (current) drug therapy: Secondary | ICD-10-CM | POA: Diagnosis not present

## 2021-03-30 DIAGNOSIS — I110 Hypertensive heart disease with heart failure: Secondary | ICD-10-CM | POA: Diagnosis present

## 2021-03-30 DIAGNOSIS — F17210 Nicotine dependence, cigarettes, uncomplicated: Secondary | ICD-10-CM | POA: Diagnosis present

## 2021-03-30 DIAGNOSIS — F1410 Cocaine abuse, uncomplicated: Secondary | ICD-10-CM

## 2021-03-30 MED ORDER — NICOTINE 14 MG/24HR TD PT24
14.0000 mg | MEDICATED_PATCH | Freq: Every day | TRANSDERMAL | Status: DC
  Filled 2021-03-30 (×4): qty 1

## 2021-03-30 MED ORDER — ALBUTEROL SULFATE HFA 108 (90 BASE) MCG/ACT IN AERS: 1.0000 | INHALATION_SPRAY | Freq: Four times a day (QID) | RESPIRATORY_TRACT | Status: DC | PRN

## 2021-03-30 MED ORDER — ARIPIPRAZOLE 5 MG PO TABS
5.0000 mg | ORAL_TABLET | Freq: Every day | ORAL | Status: DC
  Administered 2021-03-30 – 2021-03-31 (×2): 5 mg via ORAL
  Filled 2021-03-30 (×4): qty 1

## 2021-03-30 MED ORDER — METOPROLOL SUCCINATE ER 25 MG PO TB24
25.0000 mg | ORAL_TABLET | Freq: Every day | ORAL | Status: DC
  Administered 2021-03-30 – 2021-04-01 (×3): 25 mg via ORAL
  Filled 2021-03-30 (×5): qty 1

## 2021-03-30 MED ORDER — TRAZODONE HCL 50 MG PO TABS
50.0000 mg | ORAL_TABLET | Freq: Every evening | ORAL | Status: DC | PRN
  Administered 2021-03-30: 50 mg via ORAL
  Filled 2021-03-30 (×2): qty 1

## 2021-03-30 MED ORDER — MAGNESIUM HYDROXIDE 400 MG/5ML PO SUSP: 30.0000 mL | Freq: Every day | ORAL | Status: DC | PRN

## 2021-03-30 MED ORDER — HYDROXYZINE HCL 25 MG PO TABS
25.0000 mg | ORAL_TABLET | Freq: Three times a day (TID) | ORAL | Status: DC | PRN
  Administered 2021-03-30: 25 mg via ORAL
  Filled 2021-03-30 (×2): qty 1

## 2021-03-30 MED ORDER — ACETAMINOPHEN 325 MG PO TABS
650.0000 mg | ORAL_TABLET | Freq: Four times a day (QID) | ORAL | Status: DC | PRN
  Administered 2021-04-01: 650 mg via ORAL
  Filled 2021-03-30: qty 2

## 2021-03-30 MED ORDER — GABAPENTIN 100 MG PO CAPS
100.0000 mg | ORAL_CAPSULE | Freq: Three times a day (TID) | ORAL | Status: DC
  Administered 2021-03-30 – 2021-04-01 (×9): 100 mg via ORAL
  Filled 2021-03-30 (×15): qty 1

## 2021-03-30 MED ORDER — ALUM & MAG HYDROXIDE-SIMETH 200-200-20 MG/5ML PO SUSP: 30.0000 mL | ORAL | Status: DC | PRN

## 2021-03-30 MED ORDER — ENSURE ENLIVE PO LIQD
237.0000 mL | Freq: Two times a day (BID) | ORAL | Status: DC
  Administered 2021-03-30 – 2021-04-01 (×4): 237 mL via ORAL
  Filled 2021-03-30 (×9): qty 237

## 2021-03-30 MED ORDER — METFORMIN HCL ER 500 MG PO TB24
1000.0000 mg | ORAL_TABLET | Freq: Every day | ORAL | Status: DC
  Administered 2021-03-30 – 2021-04-01 (×3): 1000 mg via ORAL
  Filled 2021-03-30 (×4): qty 2

## 2021-03-30 MED ORDER — ADULT MULTIVITAMIN W/MINERALS CH
1.0000 | ORAL_TABLET | Freq: Every day | ORAL | Status: DC
  Administered 2021-03-30 – 2021-04-01 (×3): 1 via ORAL
  Filled 2021-03-30 (×7): qty 1

## 2021-03-30 MED ORDER — DULOXETINE HCL 30 MG PO CPEP
30.0000 mg | ORAL_CAPSULE | Freq: Every day | ORAL | Status: DC
  Administered 2021-03-30: 30 mg via ORAL
  Filled 2021-03-30 (×3): qty 1

## 2021-03-30 MED ORDER — SERTRALINE HCL 25 MG PO TABS
25.0000 mg | ORAL_TABLET | Freq: Every day | ORAL | Status: DC
  Administered 2021-03-31: 25 mg via ORAL
  Filled 2021-03-30 (×3): qty 1

## 2021-03-30 MED ORDER — ROSUVASTATIN CALCIUM 40 MG PO TABS
40.0000 mg | ORAL_TABLET | Freq: Every day | ORAL | Status: DC
  Administered 2021-03-30 – 2021-04-01 (×3): 40 mg via ORAL
  Filled 2021-03-30 (×5): qty 1

## 2021-03-30 NOTE — Progress Notes (Signed)
°   03/30/21 0330  Psych Admission Type (Psych Patients Only)  Admission Status Voluntary  Psychosocial Assessment  Patient Complaints Anxiety;Depression;Self-harm thoughts;Other (Comment) (uselessness, discontent, shamefulness, guilt, self-pity, remorse)  Eye Contact Fair  Facial Expression Anxious;Sad  Affect Anxious;Sad;Depressed  Speech Logical/coherent  Interaction Assertive  Motor Activity Slow  Appearance/Hygiene Unremarkable;In scrubs  Behavior Characteristics Cooperative;Anxious  Mood Depressed;Anxious;Sad;Pleasant  Thought Process  Coherency WDL  Content Blaming self  Delusions None reported or observed  Perception Hallucinations  Hallucination Visual (sees shadows sometimes)  Judgment Poor  Confusion None  Danger to Self  Current suicidal ideation? Passive  Description of Suicide Plan poison or walk into traffic  Self-Injurious Behavior Some self-injurious ideation observed or expressed.  No lethal plan expressed   Agreement Not to Harm Self Yes  Description of Agreement contract for safety  Danger to Others  Danger to Others None reported or observed   Pt contracts for safety while at [facility]. Pt says that the problem is "life in general" that led to his suicidal thoughts over the last couple of days. Pt expressed intent to harm himself if he didn't get help. Pt denies HI, AH.

## 2021-03-30 NOTE — BHH Group Notes (Signed)
Spiritual care group on grief and loss facilitated by chaplains Amy Burris, Mdiv and Janne Napoleon, [facility]   Group Goal:   Support / Education around grief and loss   Members engage in facilitated group support and psycho-social education.   Group Description:  Spiritual care group on grief and loss facilitated by chaplain Janne Napoleon, [facility]   Group Goal:   Support / Education around grief and loss   Members engage in facilitated group support and psycho-social education.   Group Description:   Following introductions and group rules, group members engaged in facilitated group dialog and support around topic of loss, with particular support around experiences of loss in their lives. Group Identified types of loss (relationships / self / things) and identified patterns, circumstances, and changes that precipitate losses. Reflected on thoughts / feelings around loss, normalized grief responses, and recognized variety in grief experience. Group noted Worden's four tasks of grief in discussion.   Group drew on Adlerian / Rogerian, narrative, MI,   Patient Progress: [name] attended group and engaged through attentive listening. He did not make any comments and his participation was minimal.  Corbin Ade, MDiv

## 2021-03-30 NOTE — Progress Notes (Signed)
Pt endorsing SI, AVH.  Denies HI.  Pt contracts for safety on the unit.  Pt has been pleasant this shift.   03/30/21 1100  Psych Admission Type (Psych Patients Only)  Admission Status Voluntary  Psychosocial Assessment  Patient Complaints Depression;Sadness;Self-harm thoughts  Eye Contact Fair  Facial Expression Anxious;Sad  Affect Anxious;Sad;Depressed  Speech Logical/coherent  Interaction Assertive  Motor Activity Slow  Appearance/Hygiene Unremarkable;In scrubs  Behavior Characteristics Cooperative;Anxious  Mood Depressed;Anxious;Sad  Thought Process  Coherency WDL  Content Blaming self  Delusions None reported or observed  Perception Hallucinations  Hallucination Visual (sees shadows sometimes)  Judgment Poor  Confusion None  Danger to Self  Current suicidal ideation? Passive  Self-Injurious Behavior Some self-injurious ideation observed or expressed.  No lethal plan expressed   Agreement Not to Harm Self Yes  Description of Agreement contract for safety  Danger to Others  Danger to Others None reported or observed

## 2021-03-30 NOTE — ED Notes (Signed)
After talking with [name] [facility] Pt informed of this and we have unknown timeline of placement at the [place]. Pt agrees to go to [facility].

## 2021-03-30 NOTE — Progress Notes (Signed)
Psychoeducational Group Note  Date:  03/30/2021 Time:  2131  Group Topic/Focus:  Wrap-Up Group:   The focus of this group is to help patients review their daily goal of treatment and discuss progress on daily workbooks.  Participation Level: Did Not Attend  Participation Quality:  Not Applicable  Affect:  Not Applicable  Cognitive:  Not Applicable  Insight:  Not Applicable  Engagement in Group: Not Applicable  Additional Comments:  The patient did not attend group this evening.   Archie Balboa S 03/30/2021, 9:31 PM

## 2021-03-30 NOTE — BH IP Treatment Plan (Signed)
Interdisciplinary Treatment and Diagnostic Plan Update  03/30/2021 Time of Session: 9:25am Hill Mackie MRN: [id number]  Principal Diagnosis: Substance induced mood disorder ([place])  Secondary Diagnoses: Principal Problem:   Substance induced mood disorder ([place])   Current Medications:  Current Facility-Administered Medications  Medication Dose Route Frequency Provider Last Rate Last Admin   acetaminophen (TYLENOL) tablet 650 mg  650 mg Oral Q6H PRN Bobbitt, Shalon E, NP       albuterol (VENTOLIN HFA) 108 (90 Base) MCG/ACT inhaler 1-2 puff  1-2 puff Inhalation Q6H PRN Bobbitt, Shalon E, NP       alum & mag hydroxide-simeth (MAALOX/MYLANTA) 200-200-20 MG/5ML suspension 30 mL  30 mL Oral Q4H PRN Bobbitt, Shalon E, NP       ARIPiprazole (ABILIFY) tablet 5 mg  5 mg Oral Daily Bobbitt, Shalon E, NP   5 mg at 03/30/21 1025   DULoxetine (CYMBALTA) DR capsule 30 mg  30 mg Oral Daily Bobbitt, Shalon E, NP   30 mg at 03/30/21 8527   feeding supplement (ENSURE ENLIVE / ENSURE PLUS) liquid 237 mL  237 mL Oral BID BM Bobbitt, Shalon E, NP       gabapentin (NEURONTIN) capsule 100 mg  100 mg Oral TID Bobbitt, Shalon E, NP   100 mg at 03/30/21 7824   hydrOXYzine (ATARAX) tablet 25 mg  25 mg Oral TID PRN Bobbitt, Shalon E, NP       magnesium hydroxide (MILK OF MAGNESIA) suspension 30 mL  30 mL Oral Daily PRN Bobbitt, Shalon E, NP       metFORMIN (GLUCOPHAGE-XR) 24 hr tablet 1,000 mg  1,000 mg Oral Q supper Bobbitt, Shalon E, NP       metoprolol succinate (TOPROL-XL) 24 hr tablet 25 mg  25 mg Oral Daily Bobbitt, Shalon E, NP   25 mg at 03/30/21 2353   nicotine (NICODERM CQ - dosed in mg/24 hours) patch 14 mg  14 mg Transdermal Daily Bobbitt, Shalon E, NP       rosuvastatin (CRESTOR) tablet 40 mg  40 mg Oral Daily Bobbitt, Shalon E, NP   40 mg at 03/30/21 6144   traZODone (DESYREL) tablet 50 mg  50 mg Oral QHS PRN Bobbitt, Shalon E, NP       PTA Medications: Medications Prior to Admission  Medication  Sig Dispense Refill Last Dose   albuterol (VENTOLIN HFA) 108 (90 Base) MCG/ACT inhaler Inhale 1-2 puffs into the lungs every 6 (six) hours as needed for wheezing or shortness of breath. 1 each 0    ARIPiprazole (ABILIFY) 5 MG tablet Take 1 tablet (5 mg total) by mouth daily. 30 tablet 0    DULoxetine (CYMBALTA) 30 MG capsule Take 1 capsule (30 mg total) by mouth daily. 30 capsule 0    gabapentin (NEURONTIN) 100 MG capsule Take 1 capsule (100 mg total) by mouth 3 (three) times daily. 90 capsule 0    metformin (FORTAMET) 1000 MG (OSM) 24 hr tablet Take 1 tablet (1,000 mg total) by mouth daily with supper. Diabetes 30 tablet 0    metoprolol succinate (TOPROL-XL) 25 MG 24 hr tablet Take 1 tablet (25 mg total) by mouth daily. 30 tablet 0    nicotine (NICODERM CQ - DOSED IN MG/24 HOURS) 21 mg/24hr patch Place 1 patch (21 mg total) onto the skin daily. (Patient not taking: Reported on 03/29/2021) 28 patch 0    rosuvastatin (CRESTOR) 40 MG tablet Take 1 tablet (40 mg total) by mouth daily. Elevated cholesterol 30 tablet  0     Patient Stressors: Other: "life in general"    Patient Strengths: Average or above average intelligence  Motivation for treatment/growth   Treatment Modalities: Medication Management, Group therapy, Case management,  1 to 1 session with clinician, Psychoeducation, Recreational therapy.   Physician Treatment Plan for Primary Diagnosis: Substance induced mood disorder ([place]) Long Term Goal(s):     Short Term Goals:    Medication Management: Evaluate patient's response, side effects, and tolerance of medication regimen.  Therapeutic Interventions: 1 to 1 sessions, Unit Group sessions and Medication administration.  Evaluation of Outcomes: Progressing  Physician Treatment Plan for Secondary Diagnosis: Principal Problem:   Substance induced mood disorder ([place])  Long Term Goal(s):     Short Term Goals:       Medication Management: Evaluate patient's response, side effects,  and tolerance of medication regimen.  Therapeutic Interventions: 1 to 1 sessions, Unit Group sessions and Medication administration.  Evaluation of Outcomes: Progressing   RN Treatment Plan for Primary Diagnosis: Substance induced mood disorder ([place]) Long Term Goal(s): Knowledge of disease and therapeutic regimen to maintain health will improve  Short Term Goals: Ability to remain free from injury will improve, Ability to verbalize frustration and anger appropriately will improve, Ability to demonstrate self-control, Ability to participate in decision making will improve, and Compliance with prescribed medications will improve  Medication Management: RN will administer medications as ordered by provider, will assess and evaluate patient's response and provide education to patient for prescribed medication. RN will report any adverse and/or side effects to prescribing provider.  Therapeutic Interventions: 1 on 1 counseling sessions, Psychoeducation, Medication administration, Evaluate responses to treatment, Monitor vital signs and CBGs as ordered, Perform/monitor CIWA, COWS, AIMS and Fall Risk screenings as ordered, Perform wound care treatments as ordered.  Evaluation of Outcomes: Progressing   LCSW Treatment Plan for Primary Diagnosis: Substance induced mood disorder ([place]) Long Term Goal(s): Safe transition to appropriate next level of care at discharge, Engage patient in therapeutic group addressing interpersonal concerns.  Short Term Goals: Engage patient in aftercare planning with referrals and resources, Increase social support, Increase ability to appropriately verbalize feelings, Increase emotional regulation, Identify triggers associated with mental health/substance abuse issues, and Increase skills for wellness and recovery  Therapeutic Interventions: Assess for all discharge needs, 1 to 1 time with Social worker, Explore available resources and support systems, Assess for adequacy  in community support network, Educate family and significant other(s) on suicide prevention, Complete Psychosocial Assessment, Interpersonal group therapy.  Evaluation of Outcomes: Progressing   Progress in Treatment: Attending groups: No. Participating in groups: No. Taking medication as prescribed: Yes. Toleration medication: Yes. Family/Significant other contact made: No, will contact:  if consent is provided Patient understands diagnosis: No. Discussing patient identified problems/goals with staff: Yes. Medical problems stabilized or resolved: Yes. Denies suicidal/homicidal ideation: Yes. Issues/concerns per patient self-inventory: No.   New problem(s) identified: No, Describe:  none  New Short Term/Long Term Goal(s): detox, medication management for mood stabilization; elimination of SI thoughts; development of comprehensive mental wellness/sobriety plan  Patient Goals:  "To think positively"   Discharge Plan or Barriers: Patient recently admitted. CSW will continue to follow and assess for appropriate referrals and possible discharge planning.    Reason for Continuation of Hospitalization: Depression Medication stabilization Suicidal ideation  Estimated Length of Stay: 3-5 days   Scribe for Treatment Team: Vassie Moselle, LCSW 03/30/2021 10:39 AM

## 2021-03-30 NOTE — Group Note (Signed)
[facility] LCSW Group Therapy Note   Group Date: 03/30/2021 Start Time: 1300 End Time: 1400   Type of Therapy/Topic:  Group Therapy:  Emotion Regulation  Participation Level:  Active   Mood:  Description of Group:    The purpose of this group is to assist patients in learning to regulate negative emotions and experience positive emotions. Patients will be guided to discuss ways in which they have been vulnerable to their negative emotions. These vulnerabilities will be juxtaposed with experiences of positive emotions or situations, and patients challenged to use positive emotions to combat negative ones. Special emphasis will be placed on coping with negative emotions in conflict situations, and patients will process healthy conflict resolution skills.  Therapeutic Goals: Patient will identify two positive emotions or experiences to reflect on in order to balance out negative emotions:  Patient will label two or more emotions that they find the most difficult to experience:  Patient will be able to demonstrate positive conflict resolution skills through discussion or role plays:   Summary of Patient Progress:   Pt shared during introductions his name and that today they are feeling positive. Pt was appropriate and attentive during group discussion.      Therapeutic Modalities:   Cognitive Behavioral Therapy Feelings Identification Dialectical Behavioral Therapy   Mliss Fritz, LCSWA
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
## 2021-03-30 NOTE — Hospital Course (Addendum)
[name] is a 66 y.o. male with PPHx of MDD, GAD, polysubstance abuse (EtOH, cocaine, tobacco), and 5th psych hospitalization since 11/2020 who presented to [name] ED (03/29/2021) as a walk-in for worsening depression and thoughts to shoot himself in the head or run into traffic or poison himself, then admitted Voluntary to [facility] (03/30/2021) for management of MDD in the setting of EtOH & cocaine abuse and housing instability.  Dc'd 03/09/2021, ED for SI on 03/12/2021 ED 2/12-"Pt reports SI for the past several days with plan to shoot himself. Reports "my life just isn't going the way I want it to." Reports noncompliance with medications" & "Pt told triage he has not been taking his prescribed medication and has not followed up with the prior resources provided. However, when asked this by clinician, pt states he has been taking his medication as prescribed."  Pt has been to [facility], [place] [facility], [place], and [facility] a total of 10 times between 12/05/2020 and today:   03/12/21 - [facility] 03/09/21 - 03/11/21 - [place] 03/06/21 - 03/09/21 - [place] 03/06/21 - [facility] 01/25/21 - 01/28/21 - [facility] 01/06/21 - 01/12/21 - [place] 01/05/21 - 01/06/21 - [place] 01/04/21 - 01/05/21 - [facility] 12/08/20 - 12/16/20 - [place] 12/05/20 - 12/08/20 - [facility]  During the patient's hospitalization, patient had extensive initial psychiatric evaluation, and follow-up psychiatric evaluations every day.  Psychiatric diagnoses provided upon initial assessment:  Principal Problem:   Substance induced mood disorder ([place])   During hospitalization, the diagnosis list was revised to reflect:  Principal Problem:   Substance induced mood disorder ([place])   Patient's psychiatric medications were adjusted on admission:  *** Current Outpatient Medications  Medication Instructions   albuterol (VENTOLIN HFA) 108 (90 Base) MCG/ACT inhaler 1-2 puffs, Inhalation, Every 6 hours PRN   ARIPiprazole (ABILIFY) 5 mg, Oral, Daily   DULoxetine (CYMBALTA)  30 mg, Oral, Daily   gabapentin (NEURONTIN) 100 mg, Oral, 3 times daily   metformin (FORTAMET) 1,000 mg, Oral, Daily with supper, Diabetes   metoprolol succinate (TOPROL-XL) 25 mg, Oral, Daily   nicotine (NICODERM CQ - DOSED IN MG/24 HOURS) 21 mg, Transdermal, Daily   rosuvastatin (CRESTOR) 40 mg, Oral, Daily, Elevated cholesterol    During the hospitalization, other adjustments were made to the patient's psychiatric medication regimen:  ***  Delete below*** Pt dc instruction notes: ***  -Follow-up with outpatient primary care doctor and other specialists -for management of chronic medical disease, including:  ***  -Testing: Follow-up with outpatient provider for abnormal lab results:  ***

## 2021-03-30 NOTE — Tx Team (Signed)
Initial Treatment Plan 03/30/2021 4:31 AM [name] TZG:[id number]    PATIENT STRESSORS: Other: "life in general"     PATIENT STRENGTHS: Average or above average intelligence  Motivation for treatment/growth    PATIENT IDENTIFIED PROBLEMS: Suicidal ideations (poison or walk into traffic)  Visual hallucinations (shadows sometimes)  Cocaine use d/o  Tobacco use d/o  Alcohol use d/o  ("Want to learn positive self-talk and feel better")           DISCHARGE CRITERIA:  Ability to meet basic life and health needs Adequate post-discharge living arrangements Improved stabilization in mood, thinking, and/or behavior Verbal commitment to aftercare and medication compliance  PRELIMINARY DISCHARGE PLAN: Attend aftercare/continuing care group Outpatient therapy Placement in alternative living arrangements  PATIENT/FAMILY INVOLVEMENT: This treatment plan has been presented to and reviewed with the patient, [name], and/or family member.  The patient and family have been given the opportunity to ask questions and make suggestions.  Lajoyce Corners, RN 03/30/2021, 4:31 AM

## 2021-03-30 NOTE — Progress Notes (Signed)
°   03/30/21 2350  Psych Admission Type (Psych Patients Only)  Admission Status Voluntary  Psychosocial Assessment  Patient Complaints Depression;Sadness  Eye Contact Brief  Facial Expression Flat  Affect Depressed  Speech Soft  Interaction Guarded  Motor Activity Slow  Appearance/Hygiene Unremarkable  Behavior Characteristics Guarded  Mood Depressed  Thought Process  Coherency WDL  Content Blaming self  Delusions None reported or observed  Perception Hallucinations  Hallucination Visual  Judgment Poor  Confusion None  Danger to Self  Current suicidal ideation? Passive  Self-Injurious Behavior No self-injurious ideation or behavior indicators observed or expressed   Agreement Not to Harm Self Yes  Description of Agreement contract for safety  Danger to Others  Danger to Others None reported or observed   D: Patient calm and cooperative. Pt appears guarded and did forward little to Probation officer.  A: Medications administered as prescribed. Support and encouragement provided as needed.  R: Patient remains safe on the unit. Will continue to monitor for safety and stability.

## 2021-03-30 NOTE — Group Note (Signed)
Recreation Therapy Group Note   Group Topic:Stress Management  Group Date: 03/30/2021 Start Time: 0945 End Time: 1000 Facilitators: Victorino Sparrow, [place] Location: [address] Dayroom   Goal Area(s) Addresses:  Patient will identify positive stress management techniques. Patient will identify benefits of using stress management post d/c.  Group Description:  Meditation.  LRT played a meditation that focused on using positive affirmations.  Patients were to listen, follow and repeat the affirmations to themselves as the meditation played.  At completion of meditation, LRT discussed with patients were they could find other stress management techniques like Youtube and Apps.    Affect/Mood: N/A   Participation Level: Did not attend    Clinical Observations/Individualized Feedback:     Plan: Continue to engage patient in RT group sessions 2-3x/week.   Victorino Sparrow, LRT,CTRS 03/30/2021 1:12 PM

## 2021-03-30 NOTE — ED Notes (Signed)
Pt has acceptance at [facility], this RN went into talk with the pt to get him to sign the paperwork for voluntary admission. Pt really wants to go to the [place]. We discussed that going to [facility] would get him out of the ED and maybe further placement after that and he was agreeable and signed the paperwork. After faxing to [facility], pt came out and states he really just wants to go to the [place]. RN at [facility] and Agricultural consultant notified, waiting for further decisions and options.
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
## 2021-03-30 NOTE — Progress Notes (Signed)
NUTRITION ASSESSMENT  Pt identified as at risk on the Malnutrition Screen Tool  INTERVENTION: 1. Supplements: Ensure Plus High Protein po BID, each supplement provides 350 kcal and 20 grams of protein. 2. Multivitamin with minerals daily  NUTRITION DIAGNOSIS: Unintentional weight loss related to sub-optimal intake as evidenced by pt report.   Goal: Pt to meet >/= 90% of their estimated nutrition needs.  Monitor:  PO intake  Assessment:  Pt admitted with depression and substance abuse (alcohol, cocaine). Pt is homeless.  Per weight records, pt has lost 27 lbs since 10/07/20 (13% wt loss x 5.5 months, significant for time frame). Ensure supplements have been ordered.    Height: Ht Readings from Last 1 Encounters:  03/30/21 5\' 9"  (1.753 m)    Weight: Wt Readings from Last 1 Encounters:  03/30/21 76.6 kg    Weight Hx: Wt Readings from Last 10 Encounters:  03/30/21 76.6 kg  03/09/21 78 kg  03/06/21 77 kg  01/06/21 77.6 kg  12/08/20 73.5 kg  10/10/20 88.5 kg  10/07/20 88.7 kg  08/29/20 105 kg  01/25/20 75.7 kg  12/30/16 96.2 kg    BMI:  Body mass index is 24.93 kg/m. Pt meets criteria for normal based on current BMI.  Estimated Nutritional Needs: Kcal: 25-30 kcal/kg Protein: > 1 gram protein/kg Fluid: 1 ml/kcal  Diet Order:  Diet Order             Diet regular Room service appropriate? Yes; Fluid consistency: Thin  Diet effective now                  Pt is also offered choice of unit snacks mid-morning and mid-afternoon.    Lab results and medications reviewed.   Clayton Bibles, MS, RD, LDN Inpatient Clinical Dietitian Contact information available via Amion

## 2021-03-30 NOTE — Progress Notes (Signed)
Patient ID: [name], male   DOB: 1955-07-14, 66 y.o.   MRN: [id number]  D: Pt here voluntarily from [facility]. Pt endorses SI, VH and pain at this time. Pt denies HI and AH. Pt states that he has been suicidal for the last couple of days with a plan to poison himself or walk into traffic. Pt says the reason is "life in general." Pt endorses feelings of uselessness, discontent, shamefulness, guilt, self-pity and remorse. Pt is homeless and does not identify anyone as a social support for himself. Pt states that he has been compliant with his meds since his last discharge from this facility in January 2023. Pt contracts for safety while at [facility]. Pt wants to go to the [place] but realizes that they are diverting patients right now.  Pt endorses substance use. Pt smokes 1 ppd of cigarettes. Pt endorses alcohol use 2-3 times a week, drinking a 6-pack of beer each time. Last drink was Friday 03/27/21 and pt states he drank (2) 6 packs. Pt also uses cocaine. Last use was also 03/27/21 and use frequency is a couple times a week. Discussed with pt dangers of using alcohol and illicit substances while taking prescription medications. Pt verbalized understanding.    A: Pt was offered support and encouragement. Pt is cooperative during assessment. VS assessed and admission paperwork signed. Belongings searched and contraband items placed in locker. Non-invasive skin search completed: no marks or tattoos found. Pt offered food and drink and both accepted. Pt introduced to unit milieu by nursing staff. Q 15 minute checks were started for safety.   R: Pt safety maintained on unit.

## 2021-03-30 NOTE — H&P (Signed)
Psychiatric Admission Assessment Adult  Patient Identification: [name] MRN:  [id number] Date of Evaluation:  03/30/2021 Chief Complaint:  Substance induced mood disorder ([place]) [F19.94] Principal Diagnosis: Substance induced mood disorder ([place]) Diagnosis:  Principal Problem:   Substance induced mood disorder ([place]) Active Problems:   Major depressive disorder, recurrent, severe with psychotic features ([place])  History of Present Illness: [name] is a 66 YO M with a history of depression and substance dependence who presents 3 weeks after last discharge for similar complaints, with conditional suicidality, with psychotic features in the setting of substance use, homelessness and psychosocial stressors. Since last discharge he has resumed using alcohol and cocaine. He has been to the ER several times. He has been to [facility] a few times during the day, and says that "they only take people when its 40 degrees". He says that he called "the living sober community" but they were full. This will be his 4th hospitalization in 4 months. He insists that this time is different, and he is suicidal with a plan (traffic). He has also been seeing shadows intermittently. He is willing to try something different or go to an inpatient rehab.  Associated Signs/Symptoms: Depression Symptoms:  depressed mood, anhedonia, psychomotor retardation, fatigue, recurrent thoughts of death, anxiety, Duration of Depression Symptoms: Greater than two weeks  (Hypo) Manic Symptoms:  Irritable Mood, Anxiety Symptoms:   NA Psychotic Symptoms:   NA PTSD Symptoms: NA Total Time spent with patient: 30 minutes  Past Psychiatric History:  Past psych diagnoses: Bipolar d/o MRE depressed severe without psychotic features (r/o substance induced mood d/o); Stimulant use d/o - cocaine type; r/o Cannabis use d/o Prior inpatient treatment: X9 within the last 10 years.  Latest being 01/25/2021, 01/06/2021, 12/08/2020 Suicide  attempts: Denied Psychiatric med trials: Patient is on the same medicines for years Current outpatient psychiatrist: Denied Current outpatient therapist: Denied History of selective adherence: Yes.  "Patient was told he had bipolar disorder in 1991 but at one point was told he could have PTSD or schizophrenia. He was on SSDI for bipolar diagnosis until SSDI converted to regular social security as he aged. He has had psychiatric hospitalizations at [facility] but does not recall dates of treatments. He currently receives psychiatric care through the [facility]. Patient says he started receiving care at the [place] [facility] in the 1990s. He started in [place], [place], but has also received care in [place], other cities in [place], and various cities in [place] and [place]. He was going to commit suicide in 2005-2006 at which time he planned to jump of a bridge because he was struggling with finances, but a Engineer, structural found him and the attempt was prevented. According to EHR he previously reported suicide attempts via jumping into traffic and jumping from a moving vehicle. He has been on various medications in the past including Zoloft, Prozac, and Haldol. Per records, he has also previously been on Neurontin, Abilify, Lamictal, and Cymbalta.  "-From last hospitalization  Is the patient at risk to self? Yes.    Has the patient been a risk to self in the past 6 months? Yes.    Has the patient been a risk to self within the distant past? Yes.    Is the patient a risk to others? No.  Has the patient been a risk to others in the past 6 months? No.  Has the patient been a risk to others within the distant past? No.   Prior Inpatient Therapy:   Prior Outpatient Therapy:  Alcohol Screening: 1. How often do you have a drink containing alcohol?: 2 to 3 times a week 2. How many drinks containing alcohol do you have on a typical day when you are drinking?: 5 or 6 3. How often do you have six or more drinks on one  occasion?: Weekly AUDIT-C Score: 8 4. How often during the last year have you found that you were not able to stop drinking once you had started?: Weekly 5. How often during the last year have you failed to do what was normally expected from you because of drinking?: Weekly 6. How often during the last year have you needed a first drink in the morning to get yourself going after a heavy drinking session?: Monthly 7. How often during the last year have you had a feeling of guilt of remorse after drinking?: Weekly 8. How often during the last year have you been unable to remember what happened the night before because you had been drinking?: Never 9. Have you or someone else been injured as a result of your drinking?: No 10. Has a relative or friend or a doctor or another health worker been concerned about your drinking or suggested you cut down?: Yes, during the last year Alcohol Use Disorder Identification Test Final Score (AUDIT): 23 Alcohol Brief Interventions/Follow-up: Alcohol education/Brief advice Substance Abuse History in the last 12 months:  No. Consequences of Substance Abuse: Withdrawal Symptoms:   Nausea Tremors Vomiting Loss of supports, housing Previous Psychotropic Medications: Yes  Psychological Evaluations: Yes  Past Medical History:  Past Medical History:  Diagnosis Date   Anxiety    Arthritis    Cancer ([place])    CARCINOMA, RENAL CELL 06/05/2008   Qualifier: Diagnosis of  By: Burnett Kanaris     Cataract    Cocaine dependence, uncomplicated ([place]) 42/35/3614   Depression    Diabetes mellitus    Disorder of kidney and ureter 12/16/2020   Jan 18, 2009 Entered By: Lorelee Cover A Comment: L nephrectomy, kidney cancer  2007   Elevated prostate specific antigen (PSA) 12/16/2020   GERD (gastroesophageal reflux disease)    H/O suicide attempt 1997 and 2011   jumped out of moving vehicle and attempted to jump into moving traffic   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   As noted on  01/04/20 CT: "1 cm low-density in the lateral aspect of the right lobe of liver"   High cholesterol    Hypertension    Legal circumstance 12/16/2020   Multiple nodules of lung 12/16/2020   Neuropathic pain    Osteoporosis    Person feigning illness 12/16/2020   Sep 06, 2007 Entered By: Celine Mans Comment: noted in several discharge summaries   S/P coronary artery stent placement (DES to LCx) 10/11/2020    Past Surgical History:  Procedure Laterality Date   COLONOSCOPY     CORONARY STENT INTERVENTION N/A 10/10/2020   Procedure: CORONARY STENT INTERVENTION;  Surgeon: Jettie Booze, MD;  Location: [facility];  Service: Cardiovascular;  Laterality: N/A;   LEFT HEART CATH AND CORONARY ANGIOGRAPHY N/A 10/10/2020   Procedure: LEFT HEART CATH AND CORONARY ANGIOGRAPHY;  Surgeon: Jettie Booze, MD;  Location: [facility];  Service: Cardiovascular;  Laterality: N/A;   NEPHRECTOMY     due to cancer   RIGHT/LEFT HEART CATH AND CORONARY ANGIOGRAPHY N/A 10/09/2020   Procedure: RIGHT/LEFT HEART CATH AND CORONARY ANGIOGRAPHY;  Surgeon: Nelva Bush, MD;  Location: [facility];  Service: Cardiovascular;  Laterality: N/A;  Family History:  Family History  Problem Relation Age of Onset   Alcohol abuse Sister    Alcohol abuse Brother    Colon cancer Neg Hx    Esophageal cancer Neg Hx    Pancreatic cancer Neg Hx    Rectal cancer Neg Hx    Stomach cancer Neg Hx    Family Psychiatric  History:  Alcohol abuse Sister     Alcohol abuse Brother   Tobacco Screening:   Social History:  Social History   Substance and Sexual Activity  Alcohol Use Yes   Alcohol/week: 24.0 standard drinks   Types: 24 Cans of beer per week   Comment: last drink was Friday 03/27/21 - drank (2) 6 packs of beer     Social History   Substance and Sexual Activity  Drug Use Yes   Types: "Crack" cocaine, Cocaine   Comment: last use Friday 03/27/21 - uses a couple times a week     Additional Social History:  Single, never been married, a retired Scientist, research (life sciences), 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 Noncombat service. Has been homeless for 30 years, but has worked Horticulturist, commercial jobs.  "  Allergies:   Allergies  Allergen Reactions   Chlorpromazine Shortness Of Breath    Other reaction(s): IRREGULAR HEART RATE   Lab Results:  Results for orders placed or performed during the hospital encounter of 03/29/21 (from the past 48 hour(s))  Comprehensive metabolic panel     Status: Abnormal   Collection Time: 03/29/21  1:35 AM  Result Value Ref Range   Sodium 139 135 - 145 mmol/L   Potassium 3.9 3.5 - 5.1 mmol/L   Chloride 106 98 - 111 mmol/L   CO2 24 22 - 32 mmol/L   Glucose, Bld 167 (H) 70 - 99 mg/dL    Comment: Glucose reference range applies only to samples taken after fasting for at least 8 hours.   BUN 12 8 - 23 mg/dL   Creatinine, Ser 0.95 0.61 - 1.24 mg/dL   Calcium 9.2 8.9 - 10.3 mg/dL   Total Protein 6.3 (L) 6.5 - 8.1 g/dL   Albumin 3.4 (L) 3.5 - 5.0 g/dL   AST 17 15 - 41 U/L   ALT 12 0 - 44 U/L   Alkaline Phosphatase 55 38 - 126 U/L   Total Bilirubin 0.7 0.3 - 1.2 mg/dL   GFR, Estimated >60 >60 mL/min    Comment: (NOTE) Calculated using the CKD-EPI Creatinine Equation (2021)    Anion gap 9 5 - 15    Comment: Performed at [facility], [place] [address], [place], [place] [address]  Ethanol     Status: None   Collection Time: 03/29/21  1:35 AM  Result Value Ref Range   Alcohol, Ethyl (B) <10 <10 mg/dL    Comment: (NOTE) Lowest detectable limit for serum alcohol is 10 mg/dL.  For medical purposes only. Performed at [facility], [place] [address] [place] , [place] [address]   Salicylate level     Status: Abnormal   Collection Time: 03/29/21  1:35 AM  Result Value Ref Range   Salicylate Lvl <0.2 (L) 7.0 - 30.0 mg/dL    Comment: Performed at [facility] [address] [place], [place] [address]   Acetaminophen level     Status: Abnormal   Collection Time: 03/29/21  1:35 AM  Result Value Ref Range   Acetaminophen (Tylenol), Serum <10 (L) 10 - 30 ug/mL  Comment: (NOTE) Therapeutic concentrations vary significantly. A range of 10-30 ug/mL  may be an effective concentration for many patients. However, some  are best treated at concentrations outside of this range. Acetaminophen concentrations >150 ug/mL at 4 hours after ingestion  and >50 ug/mL at 12 hours after ingestion are often associated with  toxic reactions.  Performed at [facility], [place] [address], [place], [place] [address]   cbc     Status: None   Collection Time: 03/29/21  1:35 AM  Result Value Ref Range   WBC 4.6 4.0 - 10.5 K/uL   RBC 4.79 4.22 - 5.81 MIL/uL   Hemoglobin 13.6 13.0 - 17.0 g/dL   HCT 43.2 39.0 - 52.0 %   MCV 90.2 80.0 - 100.0 fL   MCH 28.4 26.0 - 34.0 pg   MCHC 31.5 30.0 - 36.0 g/dL   RDW 14.4 11.5 - 15.5 %   Platelets 206 150 - 400 K/uL   nRBC 0.0 0.0 - 0.2 %    Comment: Performed at [facility], [place] [address] [place], [place] [address]  Rapid urine drug screen (hospital performed)     Status: Abnormal   Collection Time: 03/29/21  1:42 AM  Result Value Ref Range   Opiates NONE DETECTED NONE DETECTED   Cocaine POSITIVE (A) NONE DETECTED   Benzodiazepines NONE DETECTED NONE DETECTED   Amphetamines NONE DETECTED NONE DETECTED   Tetrahydrocannabinol NONE DETECTED NONE DETECTED   Barbiturates NONE DETECTED NONE DETECTED    Comment: (NOTE) DRUG SCREEN FOR MEDICAL PURPOSES ONLY.  IF CONFIRMATION IS NEEDED FOR ANY PURPOSE, NOTIFY LAB WITHIN 5 DAYS.  LOWEST DETECTABLE LIMITS FOR URINE DRUG SCREEN Drug Class                     Cutoff (ng/mL) Amphetamine and metabolites    1000 Barbiturate and metabolites    200 Benzodiazepine                 185 Tricyclics and metabolites     300 Opiates and metabolites        300 Cocaine and metabolites        300 THC                             50 Performed at [facility], [place] [address] [place], [place] [address]   Resp Panel by RT-PCR (Flu A&B, Covid) Nasopharyngeal Swab     Status: None   Collection Time: 03/29/21  2:49 AM   Specimen: Nasopharyngeal Swab; Nasopharyngeal(NP) swabs in vial transport medium  Result Value Ref Range   SARS Coronavirus 2 by RT PCR NEGATIVE NEGATIVE    Comment: (NOTE) SARS-CoV-2 target nucleic acids are NOT DETECTED.  The SARS-CoV-2 RNA is generally detectable in upper respiratory specimens during the acute phase of infection. The lowest concentration of SARS-CoV-2 viral copies this assay can detect is 138 copies/mL. A negative result does not preclude SARS-Cov-2 infection and should not be used as the sole basis for treatment or other patient management decisions. A negative result may occur with  improper specimen collection/handling, submission of specimen other than nasopharyngeal swab, presence of viral mutation(s) within the areas targeted by this assay, and inadequate number of viral copies(<138 copies/mL). A negative result must be combined with clinical observations, patient history, and epidemiological information. The expected result is Negative.  Fact Sheet for Patients:  EntrepreneurPulse.com.au  Fact  Sheet for Healthcare Providers:  IncredibleEmployment.be  This test is no t yet approved or cleared by the [place] FDA and  has been authorized for detection and/or diagnosis of SARS-CoV-2 by FDA under an Emergency Use Authorization (EUA). This EUA will remain  in effect (meaning this test can be used) for the duration of the COVID-19 declaration under Section 564(b)(1) of the Act, 21 U.S.C.section 360bbb-3(b)(1), unless the authorization is terminated  or revoked sooner.       Influenza A by PCR NEGATIVE NEGATIVE   Influenza B by PCR NEGATIVE NEGATIVE    Comment: (NOTE) The Xpert Xpress SARS-CoV-2/FLU/RSV plus assay  is intended as an aid in the diagnosis of influenza from Nasopharyngeal swab specimens and should not be used as a sole basis for treatment. Nasal washings and aspirates are unacceptable for Xpert Xpress SARS-CoV-2/FLU/RSV testing.  Fact Sheet for Patients: EntrepreneurPulse.com.au  Fact Sheet for Healthcare Providers: IncredibleEmployment.be  This test is not yet approved or cleared by the [place] FDA and has been authorized for detection and/or diagnosis of SARS-CoV-2 by FDA under an Emergency Use Authorization (EUA). This EUA will remain in effect (meaning this test can be used) for the duration of the COVID-19 declaration under Section 564(b)(1) of the Act, 21 U.S.C. section 360bbb-3(b)(1), unless the authorization is terminated or revoked.  Performed at [facility], [place] [address], [place], [place] [address]     Blood Alcohol level:  Lab Results  Component Value Date   [facility] <10 03/29/2021   ETH <10 24/26/8341    Metabolic Disorder Labs:  Lab Results  Component Value Date   HGBA1C 7.9 (H) 03/10/2021   MPG 180 03/10/2021   MPG 148.46 12/10/2020   No results found for: PROLACTIN Lab Results  Component Value Date   CHOL 177 12/10/2020   TRIG 124 12/10/2020   HDL 50 12/10/2020   CHOLHDL 3.5 12/10/2020   VLDL 25 12/10/2020   LDLCALC 102 (H) 12/10/2020   LDLCALC 82 10/10/2020    Current Medications: Current Facility-Administered Medications  Medication Dose Route Frequency Provider Last Rate Last Admin   acetaminophen (TYLENOL) tablet 650 mg  650 mg Oral Q6H PRN Bobbitt, Shalon E, NP       albuterol (VENTOLIN HFA) 108 (90 Base) MCG/ACT inhaler 1-2 puff  1-2 puff Inhalation Q6H PRN Bobbitt, Shalon E, NP       alum & mag hydroxide-simeth (MAALOX/MYLANTA) 200-200-20 MG/5ML suspension 30 mL  30 mL Oral Q4H PRN Bobbitt, Shalon E, NP       ARIPiprazole (ABILIFY) tablet 5 mg  5 mg Oral Daily Bobbitt, Shalon E, NP   5 mg at  03/30/21 9622   feeding supplement (ENSURE ENLIVE / ENSURE PLUS) liquid 237 mL  237 mL Oral BID BM Bobbitt, Shalon E, NP   237 mL at 03/30/21 1158   gabapentin (NEURONTIN) capsule 100 mg  100 mg Oral TID Bobbitt, Shalon E, NP   100 mg at 03/30/21 1158   hydrOXYzine (ATARAX) tablet 25 mg  25 mg Oral TID PRN Bobbitt, Shalon E, NP       magnesium hydroxide (MILK OF MAGNESIA) suspension 30 mL  30 mL Oral Daily PRN Bobbitt, Shalon E, NP       metFORMIN (GLUCOPHAGE-XR) 24 hr tablet 1,000 mg  1,000 mg Oral Q supper Bobbitt, Shalon E, NP       metoprolol succinate (TOPROL-XL) 24 hr tablet 25 mg  25 mg Oral Daily Bobbitt, Shalon E, NP   25  mg at 03/30/21 7893   nicotine (NICODERM CQ - dosed in mg/24 hours) patch 14 mg  14 mg Transdermal Daily Bobbitt, Shalon E, NP       rosuvastatin (CRESTOR) tablet 40 mg  40 mg Oral Daily Bobbitt, Shalon E, NP   40 mg at 03/30/21 0812   [START ON 03/31/2021] sertraline (ZOLOFT) tablet 25 mg  25 mg Oral Daily Kadeja Granada, Jackie Plum, MD       traZODone (DESYREL) tablet 50 mg  50 mg Oral QHS PRN Bobbitt, Shalon E, NP       PTA Medications: Medications Prior to Admission  Medication Sig Dispense Refill Last Dose   albuterol (VENTOLIN HFA) 108 (90 Base) MCG/ACT inhaler Inhale 1-2 puffs into the lungs every 6 (six) hours as needed for wheezing or shortness of breath. 1 each 0    ARIPiprazole (ABILIFY) 5 MG tablet Take 1 tablet (5 mg total) by mouth daily. 30 tablet 0    DULoxetine (CYMBALTA) 30 MG capsule Take 1 capsule (30 mg total) by mouth daily. 30 capsule 0    gabapentin (NEURONTIN) 100 MG capsule Take 1 capsule (100 mg total) by mouth 3 (three) times daily. 90 capsule 0    metformin (FORTAMET) 1000 MG (OSM) 24 hr tablet Take 1 tablet (1,000 mg total) by mouth daily with supper. Diabetes 30 tablet 0    metoprolol succinate (TOPROL-XL) 25 MG 24 hr tablet Take 1 tablet (25 mg total) by mouth daily. 30 tablet 0    nicotine (NICODERM CQ - DOSED IN MG/24 HOURS) 21 mg/24hr patch  Place 1 patch (21 mg total) onto the skin daily. (Patient not taking: Reported on 03/29/2021) 28 patch 0    rosuvastatin (CRESTOR) 40 MG tablet Take 1 tablet (40 mg total) by mouth daily. Elevated cholesterol 30 tablet 0     Musculoskeletal: Strength & Muscle Tone: within normal limits Gait & Station: normal Patient leans: N/A            Psychiatric Specialty Exam:  Presentation  General Appearance: Disheveled  Eye Contact:Fair  Speech:Normal Rate  Speech Volume:Normal  Handedness:Right   Mood and Affect  Mood:Irritable  Affect:Congruent   Thought Process  Thought Processes:Linear; Goal Directed  Duration of Psychotic Symptoms: Greater than six months  Past Diagnosis of Schizophrenia or Psychoactive disorder: No  Descriptions of Associations:Intact  Orientation:Full (Time, Place and Person)  Thought Content:Logical  Hallucinations:Hallucinations: None  Ideas of Reference:None  Suicidal Thoughts:Suicidal Thoughts: -- (conditional) SI Active Intent and/or Plan: Without Intent  Homicidal Thoughts:Homicidal Thoughts: No   Sensorium  Memory:Immediate Fair  Judgment:Poor  Insight:Poor   Executive Functions  Concentration:Fair  Attention Span:Fair  [place]   Psychomotor Activity  Psychomotor Activity:Psychomotor Activity: Normal   Assets  Assets:Communication Skills   Sleep  Sleep:Sleep: Fair Number of Hours of Sleep: 7    Physical Exam: Physical Exam Vitals and nursing note reviewed.  Constitutional:      Appearance: Normal appearance. He is normal weight.  HENT:     Head: Normocephalic.  Eyes:     Extraocular Movements: Extraocular movements intact.  Pulmonary:     Effort: Pulmonary effort is normal.  Musculoskeletal:        General: Normal range of motion.     Cervical back: Normal range of motion.  Neurological:     General: No focal deficit present.     Mental  Status: He is alert.     Motor: Tremor present.  Psychiatric:  Attention and Perception: Attention normal. He does not perceive auditory or visual hallucinations.        Speech: Speech normal.        Behavior: Behavior normal. Behavior is cooperative.        Thought Content: Thought content normal. Thought content is not paranoid or delusional. Thought content does not include homicidal ideation.        Cognition and Memory: Cognition and memory normal.        Judgment: Judgment is impulsive.     Comments: Irritable Chronic, conditional suicidality    Review of Systems  Constitutional:  Negative for fever.  Cardiovascular:  Negative for chest pain.  Gastrointestinal:  Positive for nausea and vomiting.  Musculoskeletal:  Positive for back pain and myalgias.  Skin:  Negative for rash.  Neurological:  Positive for tremors.  Psychiatric/Behavioral:  Positive for depression and substance abuse. Negative for hallucinations.    Blood pressure 113/77, pulse (!) 109, temperature 99 F (37.2 C), temperature source Oral, height 5\' 9"  (1.753 m), weight 76.6 kg, SpO2 99 %. Body mass index is 24.93 kg/m.  Treatment Plan Summary: Daily contact with patient to assess and evaluate symptoms and progress in treatment and Medication management  Observation Level/Precautions:  Detox 15 minute checks  Laboratory:   lipids, A1c  Psychotherapy:    Medications:    Consultations:    Discharge Concerns:    Estimated LOS:  Other:     Physician Treatment Plan for Primary Diagnosis: Substance induced mood disorder ([place]) Long Term Goal(s): Improvement in symptoms so as ready for discharge  Short Term Goals: Ability to identify changes in lifestyle to reduce recurrence of condition will improve, Ability to verbalize feelings will improve, Ability to disclose and discuss suicidal ideas, Ability to demonstrate self-control will improve, Ability to identify and develop effective coping behaviors will  improve, Ability to maintain clinical measurements within normal limits will improve, Compliance with prescribed medications will improve, and Ability to identify triggers associated with substance abuse/mental health issues will improve  Physician Treatment Plan for Secondary Diagnosis: Principal Problem:   Substance induced mood disorder ([place]) Active Problems:   Major depressive disorder, recurrent, severe with psychotic features ([place])  Long Term Goal(s): Improvement in symptoms so as ready for discharge  Short Term Goals: Ability to identify changes in lifestyle to reduce recurrence of condition will improve, Ability to verbalize feelings will improve, Ability to disclose and discuss suicidal ideas, Ability to demonstrate self-control will improve, Ability to identify and develop effective coping behaviors will improve, Ability to maintain clinical measurements within normal limits will improve, Compliance with prescribed medications will improve, and Ability to identify triggers associated with substance abuse/mental health issues will improve  I certify that inpatient services furnished can reasonably be expected to improve the patient's condition.    Maida Sale, MD 2/13/20232:58 PM

## 2021-03-31 DIAGNOSIS — F333 Major depressive disorder, recurrent, severe with psychotic symptoms: Secondary | ICD-10-CM | POA: Diagnosis not present

## 2021-03-31 MED ORDER — ARIPIPRAZOLE 15 MG PO TABS
7.5000 mg | ORAL_TABLET | Freq: Every day | ORAL | Status: DC
  Administered 2021-04-01: 7.5 mg via ORAL
  Filled 2021-03-31 (×3): qty 1

## 2021-03-31 MED ORDER — SERTRALINE HCL 50 MG PO TABS
50.0000 mg | ORAL_TABLET | Freq: Every day | ORAL | Status: DC
  Administered 2021-04-01: 50 mg via ORAL
  Filled 2021-03-31 (×3): qty 1

## 2021-03-31 MED ORDER — NICOTINE 14 MG/24HR TD PT24: 14.0000 mg | MEDICATED_PATCH | Freq: Every day | TRANSDERMAL | Status: DC | PRN

## 2021-03-31 NOTE — [facility] Group Notes (Signed)
Patient did not the [place] group.

## 2021-03-31 NOTE — Plan of Care (Signed)
Nurse discussed coping skills with patient.  

## 2021-03-31 NOTE — [facility] Counselor (Signed)
Pt was given a folder of homeless resources that included shelters, boarding houses, low-cost housing, food pantries, GoodRx card, and crisis resources.  Pt was also provided [employer] and [facility] contact number.   Toney Reil, [place] Worker [employer]

## 2021-03-31 NOTE — Progress Notes (Addendum)
[facility] MD Progress Note  03/31/2021 4:56 PM [name]  MRN: [id number]  CC: SI  Subjective: [name] is [name] 66 y.o. male with PPHx polysubstance abuse (EtOH, cocaine, tobacco), 4th or 5th psych hospitalization since 11/2020, and reported MDD and GAD, who presented to [name] (03/29/2021) as [name] walk-in for worsening depression and thoughts to shoot himself in the head or run into traffic or poison himself, then admitted Voluntary to [name] (03/30/2021) for management of MDD in the setting of EtOH & cocaine abuse and housing instability.  Patient was discharged from this facility on 03/09/2021 [name] Day 1   Overnight Events: No acute events overnight. Patient has been compliant with scheduled meds, no agitation PRN's required.  Patient attended select groups yesterday.  Interim History: Patient was evaluated today with attending inpatient room on Hall [address].  Patient was cooperative and pleasant, but evasive and inconsistent with interview.  Patient reported having "suicidal ideation", visual hallucinations of "black spots", and auditory hallucinations "to commit suicide".  Patient did not discuss the content of his command AH. When discussing the concern that rehab facilities or sober living facilities will not accept patients who are actively suicidal, patient then denied his suicidality.  He reported that he is also interested in residential rehabilitation.  Discussed with patient the importance of communicating how he truly feels in regards to his mood and thought content for accurate evaluation for patient's treatment.  After discharge during last hospitalization, the patient stated that he did visit the [name] for services, and stated that they told him to continue following up with outpatient services.  When confronted with how the [name] tried to reach out to him, patient reported not having [name] phone.  He reported stable sleep, good appetite. Patient denied medication side  effects and is tolerating it well. Patient denied delusions, paranoia, first rank symptoms, or ideas of reference and contracted for safety on the unit.   Diagnosis: Principal Problem:   Major depressive disorder, recurrent, severe with psychotic features ([place]) Active Problems:   Cocaine abuse ([place])   Substance induced mood disorder ([place])   Total Time spent with patient:  I personally spent 30 minutes on the unit in direct patient care. The direct patient care time included face-to-face time with the patient, reviewing the patient's chart, communicating with other professionals, and coordinating care. Greater than 50% of this time was spent in counseling or coordinating care with the patient regarding goals of hospitalization, psycho-education, and discharge planning needs.   Past Psychiatric History:  Past psych diagnoses: Bipolar d/o MRE depressed severe without psychotic features (r/o substance induced mood d/o); Stimulant use d/o - cocaine type; r/o Cannabis use d/o Prior inpatient treatment: X9 within the last 10 years.  Latest being 01/25/2021, 01/06/2021, 12/08/2020 Suicide attempts: Denied Psychiatric med trials: Patient is on the same medicines for years Current outpatient psychiatrist: Denied Current outpatient therapist: Denied History of selective adherence: Yes.  "Patient was told he had bipolar disorder in 1991 but at one point was told he could have PTSD or schizophrenia. He was on SSDI for bipolar diagnosis until SSDI converted to regular social security as he aged. He has had psychiatric hospitalizations at [name] but does not recall dates of treatments. He currently receives psychiatric care through the [name]. Patient says he started receiving care at the [name] in the 1990s. He started in [place], [place], but has also received care in [name], other cities in [place], and various cities in [place]  and [name]. He was going to commit suicide in 2005-2006 at which time he  planned to jump of [name] bridge because he was struggling with finances, but [name] Engineer, structural found him and the attempt was prevented. According to EHR he previously reported suicide attempts via jumping into traffic and jumping from [name] moving vehicle. He has been on various medications in the past including Zoloft, Prozac, and Haldol. Per records, he has also previously been on Neurontin, Abilify, Lamictal, and Cymbalta.  "-From last hospitalization  Past Medical History:  Past Medical History:  Diagnosis Date   Anxiety    Arthritis    Cancer ([name])    CARCINOMA, RENAL CELL 06/05/2008   Qualifier: Diagnosis of  By: [name]     Cataract    Cocaine dependence, uncomplicated ([place]) 54/65/6812   Depression    Diabetes mellitus    Disorder of kidney and ureter 12/16/2020   Jan 18, 2009 Entered By: [name] Comment: L nephrectomy, kidney cancer  2007   Elevated prostate specific antigen (PSA) 12/16/2020   GERD (gastroesophageal reflux disease)    H/O suicide attempt 1997 and 2011   jumped out of moving vehicle and attempted to jump into moving traffic   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   As noted on 01/04/20 CT: "1 cm low-density in the lateral aspect of the right lobe of liver"   High cholesterol    Hypertension    Legal circumstance 12/16/2020   Multiple nodules of lung 12/16/2020   Neuropathic pain    Osteoporosis    Person feigning illness 12/16/2020   Sep 06, 2007 Entered By: Celine Mans Comment: noted in several discharge summaries   S/P coronary artery stent placement (DES to LCx) 10/11/2020    Past Surgical History:  Procedure Laterality Date   COLONOSCOPY     CORONARY STENT INTERVENTION N/[name] 10/10/2020   Procedure: CORONARY STENT INTERVENTION;  Surgeon: Jettie Booze, MD;  Location: [place] [facility];  Service: Cardiovascular;  Laterality: N/[name];   LEFT HEART CATH AND CORONARY ANGIOGRAPHY N/[name] 10/10/2020   Procedure: LEFT HEART CATH AND CORONARY ANGIOGRAPHY;  Surgeon:  Jettie Booze, MD;  Location: [place] [facility];  Service: Cardiovascular;  Laterality: N/[name];   NEPHRECTOMY     due to cancer   RIGHT/LEFT HEART CATH AND CORONARY ANGIOGRAPHY N/[name] 10/09/2020   Procedure: RIGHT/LEFT HEART CATH AND CORONARY ANGIOGRAPHY;  Surgeon: Nelva Bush, MD;  Location: [place] [facility];  Service: Cardiovascular;  Laterality: N/[name];   Family History:  Family History  Problem Relation Age of Onset   Alcohol abuse Sister    Alcohol abuse Brother    Colon cancer Neg Hx    Esophageal cancer Neg Hx    Pancreatic cancer Neg Hx    Rectal cancer Neg Hx    Stomach cancer Neg Hx    Family Psychiatric History: See H&P Social History:  Social History   Substance and Sexual Activity  Alcohol Use Yes   Alcohol/week: 24.0 standard drinks   Types: 24 Cans of beer per week   Comment: last drink was Friday 03/27/21 - drank (2) 6 packs of beer     Social History   Substance and Sexual Activity  Drug Use Yes   Types: "Crack" cocaine, Cocaine   Comment: last use Friday 03/27/21 - uses [name] couple times [name] week    Social History   Socioeconomic History   Marital status: Single    Spouse name: Not on file   Number of children:  Not on file   Years of education: Not on file   Highest education level: Not on file  Occupational History   Not on file  Tobacco Use   Smoking status: Every Day    Packs/day: 1.00    Years: 20.00    Pack years: 20.00    Types: Cigarettes   Smokeless tobacco: Never  Vaping Use   Vaping Use: Never used  Substance and Sexual Activity   Alcohol use: Yes    Alcohol/week: 24.0 standard drinks    Types: 24 Cans of beer per week    Comment: last drink was Friday 03/27/21 - drank (2) 6 packs of beer   Drug use: Yes    Types: "Crack" cocaine, Cocaine    Comment: last use Friday 03/27/21 - uses [name] couple times [name] week   Sexual activity: Yes    Birth control/protection: Condom  Other Topics Concern   Not on file  Social History Narrative    Homeless. Has sister in area.   Social Determinants of Health   Financial Resource Strain: Not on file  Food Insecurity: Not on file  Transportation Needs: Not on file  Physical Activity: Not on file  Stress: Not on file  Social Connections: Not on file    Appetite:  Per above  Current Medications: Current Facility-Administered Medications  Medication Dose Route Frequency Provider Last Rate Last Admin   acetaminophen (TYLENOL) tablet 650 mg  650 mg Oral Q6H PRN Bobbitt, Shalon E, NP       albuterol (VENTOLIN HFA) 108 (90 Base) MCG/ACT inhaler 1-2 puff  1-2 puff Inhalation Q6H PRN Bobbitt, Shalon E, NP       alum & mag hydroxide-simeth (MAALOX/MYLANTA) 200-200-20 MG/5ML suspension 30 mL  30 mL Oral Q4H PRN Bobbitt, Shalon E, NP       [START ON 04/01/2021] ARIPiprazole (ABILIFY) tablet 7.5 mg  7.5 mg Oral Daily Merrily Brittle, DO       feeding supplement (ENSURE ENLIVE / ENSURE PLUS) liquid 237 mL  237 mL Oral BID BM Bobbitt, Shalon E, NP   237 mL at 03/31/21 1345   gabapentin (NEURONTIN) capsule 100 mg  100 mg Oral TID Bobbitt, Shalon E, NP   100 mg at 03/31/21 1300   hydrOXYzine (ATARAX) tablet 25 mg  25 mg Oral TID PRN Bobbitt, Shalon E, NP   25 mg at 03/30/21 2119   magnesium hydroxide (MILK OF MAGNESIA) suspension 30 mL  30 mL Oral Daily PRN Bobbitt, Shalon E, NP       metFORMIN (GLUCOPHAGE-XR) 24 hr tablet 1,000 mg  1,000 mg Oral Q supper Bobbitt, Shalon E, NP   1,000 mg at 03/30/21 1731   metoprolol succinate (TOPROL-XL) 24 hr tablet 25 mg  25 mg Oral Daily Bobbitt, Shalon E, NP   25 mg at 03/31/21 0900   multivitamin with minerals tablet 1 tablet  1 tablet Oral Daily Massengill, Nathan, MD   1 tablet at 03/31/21 0900   nicotine (NICODERM CQ - dosed in mg/24 hours) patch 14 mg  14 mg Transdermal Daily PRN Merrily Brittle, DO       rosuvastatin (CRESTOR) tablet 40 mg  40 mg Oral Daily Bobbitt, Shalon E, NP   40 mg at 03/31/21 0900   [START ON 04/01/2021] sertraline (ZOLOFT) tablet 50 mg   50 mg Oral Daily Merrily Brittle, DO       traZODone (DESYREL) tablet 50 mg  50 mg Oral QHS PRN Bobbitt, Lennie Muckle, NP  50 mg at 03/30/21 2119    Lab Results:  No results found for this or any previous visit (from the past 48 hour(s)).  Blood Alcohol level:  Lab Results  Component Value Date   ETH <10 03/29/2021   ETH <10 29/51/8841    Metabolic Disorder Labs: Lab Results  Component Value Date   HGBA1C 7.9 (H) 03/10/2021   MPG 180 03/10/2021   MPG 148.46 12/10/2020   No results found for: PROLACTIN Lab Results  Component Value Date   CHOL 177 12/10/2020   TRIG 124 12/10/2020   HDL 50 12/10/2020   CHOLHDL 3.5 12/10/2020   VLDL 25 12/10/2020   LDLCALC 102 (H) 12/10/2020   LDLCALC 82 10/10/2020    Physical Findings: Musculoskeletal: Strength & Muscle Tone: within normal limits Gait & Station: normal Patient leans: N/[name]   Psychiatric Specialty Exam: Physical Exam Vitals and nursing note reviewed.  Constitutional:      General: He is not in acute distress.    Appearance: He is not ill-appearing or diaphoretic.  HENT:     Head: Normocephalic.  Pulmonary:     Effort: Pulmonary effort is normal. No respiratory distress.  Neurological:     General: No focal deficit present.     Mental Status: He is alert.  Psychiatric:        Behavior: Behavior is cooperative.     Review of Systems  Respiratory:  Negative for shortness of breath.   Cardiovascular:  Negative for chest pain.  Gastrointestinal:  Negative for nausea and vomiting.  Neurological:  Negative for dizziness and headaches.    Body mass index is 24.93 kg/m. Temp:  [98.2 F (36.8 C)-98.4 F (36.9 C)] 98.4 F (36.9 C) (02/14 0612) Pulse Rate:  [61-107] 61 (02/14 0614) BP: (129-151)/(70-91) 129/90 (02/14 0614) SpO2:  [98 %-100 %] 100 % (02/14 0612)  General Appearance:  Appropriate for Environment; Casual; Fairly Groomed    Eye Contact:   Good    Speech:   Clear and Coherent; Normal Rate    Volume:    Normal    Mood:   Depressed    Affect:   Non-Congruent; Full Range    Thought Process:  Coherent; Goal Directed; Linear  Descriptions of Associations: Intact   Orientation:   Full (Time, Place and Person)   Thought Content:   Reports residual AVH but is not grossly responding to internal/external stimuli on exam; denies paranoia, ideas of reference or first rank symptoms; initially reported passive SI but then denied with further questioning and contracted for safety on the unit; denied HI  Hallucinations: Hallucinations: Visual Description of Auditory Hallucinations: Reported "auditory hallucinations to kill myself", patient was evasive and did not describe the content of his AH Description of Visual Hallucinations: Reported "black spots"  Ideas of Reference: None   Suicidal Thoughts:   Initially passive SI but then denied SI with additional questioning and contracted for safety on the unit  Homicidal Thoughts:   Denied    Memory:   Immediate Fair; Recent Fair; Remote Fair    Judgement:   Poor   Insight:   Shallow    Psychomotor Activity:   Normal    Concentration:   Good    Attention Span:   Good   Recall:   [place] of Knowledge:   Good    Language:   Good    Handed:   Right    Assets:   Desire for Improvement; Leisure Time  Sleep:   Fair 7     AIMS: Facial and Oral Movements Muscles of Facial Expression: None, normal Lips and Perioral Area: None, normal Jaw: None, normal Tongue: None, normal,Extremity Movements Upper (arms, wrists, hands, fingers): None, normal Lower (legs, knees, ankles, toes): None, normal, Trunk Movements Neck, shoulders, hips: None, normal, Overall Severity Severity of abnormal movements (highest score from questions above): None, normal Incapacitation due to abnormal movements: None, normal Patient's awareness of abnormal movements (rate only patient's report): No Awareness, Dental Status Current  problems with teeth and/or dentures?: No Does patient usually wear dentures?: No   CIWA: CIWA-Ar Total: 1 COWS: COWS Total Score: 3     Treatment Assessment & Plan Summary: Principal Problem:   Major depressive disorder, recurrent, severe with psychotic features ([place]) Active Problems:   Cocaine abuse ([place])   Substance induced mood disorder ([facility])   [name] is [name] 66 y.o. male with PPHx polysubstance abuse (EtOH, cocaine, tobacco), 4th or 5th psych hospitalization since 11/2020, and reported MDD and GAD, who presented to [name] (03/29/2021) as [name] walk-in for worsening depression and thoughts to shoot himself in the head or run into traffic or poison himself, then [name] (03/30/2021) for management of MDD in the setting of EtOH & cocaine abuse and housing instability.  Patient was discharged from this facility on 03/09/2021 [name] Day 1   PLAN: MDD with psychotic features, r/o substance-induced mood/psychotic disorder Increased Abilify 5 mg to 7.5 mg daily for residual AVH (QTC 427ms, Lipids WNL except for LDL 102 in October 2022; A1c 6.8 in October 2022) Continued gabapentin 100 mg TID for anxiety Increased Zoloft 25 mg to 50 mg daily for residual depressive symptoms  Stimulant use d/o - cocaine type Tobacco use d/o We will work with CSW for rehab options/sober living options and patient counseled on need to abstain from substance use after discharge NRT   CAD  Continue Toprol XL 25mg  daily Continue Crestor 40mg  daily  DM Continue Metformin 1000mg  daily  Dispo: PCP: [facility]  Safety, monitoring and disposition planning: The patient was seen and evaluated on the unit.  The patient's chart was reviewed and nursing notes were reviewed.  The patient's case was discussed in multidisciplinary team meeting.  Plan and drug side effects were discussed with patient who was amendable. Social work and case management to  assist with discharge planning and identification of [name] follow-up needs prior to discharge. Discharge Concerns: Need to establish [name] safety plan; Medication compliance and effectiveness Discharge Goals: Return home with outpatient referrals for mental health follow-up including medication management/psychotherapy Safety and Monitoring: Voluntary status at inpatient psychiatric unit for safety, stabilization and treatment Daily contact with patient to assess and evaluate symptoms and progress in treatment and medical management Patient's case to be discussed in multi-disciplinary team meeting Observation Level: Per above Vital signs:  q12 hours Precautions: Per chart  Signed: Merrily Brittle, DO Psychiatry Resident, PGY-1 [place] [name] 03/31/2021, 4:56 PM

## 2021-03-31 NOTE — Progress Notes (Signed)
°   03/31/21 1100  Psych Admission Type (Psych Patients Only)  Admission Status Voluntary  Psychosocial Assessment  Patient Complaints Depression;Sadness  Eye Contact Brief  Facial Expression Flat  Affect Depressed  Speech Soft  Interaction Guarded  Motor Activity Slow  Appearance/Hygiene Unremarkable  Behavior Characteristics Guarded  Mood Depressed  Thought Process  Coherency WDL  Content Blaming self  Delusions None reported or observed  Perception Hallucinations  Hallucination Visual  Judgment Poor  Confusion None  Danger to Self  Current suicidal ideation? Passive  Self-Injurious Behavior No self-injurious ideation or behavior indicators observed or expressed   Agreement Not to Harm Self Yes  Description of Agreement verbally contracts for safety  Danger to Others  Danger to Others None reported or observed

## 2021-03-31 NOTE — Progress Notes (Signed)
D:  Patient's self inventory sheet, patient has poor sleep, no sleep medication.  Poor appetite, low energy level, poor concentration.  Rated depression, hopeless and anxiety #9.  Withdrawals, chilling, cravings, agitation, runny nose.  SI, sometimes, contracts for safety.  Physical problems, lightheaded, dizzy, headaches.  Physical pain in the past 24 hours is #9.  Pain medicine is helpful.  Goal is think positive.  Plans to be assertive.  No discharge plans. A:  Medications administered per MD orders.  Emotional support and encouragement given patient. R:  Denied SI and HI, contracts for safety.  Denied A/V hallucinations.  Safety maintained with 15 minute checks.

## 2021-04-01 DIAGNOSIS — F333 Major depressive disorder, recurrent, severe with psychotic symptoms: Secondary | ICD-10-CM | POA: Diagnosis not present

## 2021-04-01 MED ORDER — TRAZODONE HCL 50 MG PO TABS: 50.0000 mg | ORAL_TABLET | Freq: Every evening | ORAL | 0 refills | Status: AC | PRN

## 2021-04-01 MED ORDER — HYDROXYZINE HCL 25 MG PO TABS: 25.0000 mg | ORAL_TABLET | Freq: Three times a day (TID) | ORAL | 0 refills | Status: AC | PRN

## 2021-04-01 MED ORDER — METOPROLOL SUCCINATE ER 25 MG PO TB24
25.0000 mg | ORAL_TABLET | Freq: Every day | ORAL | 0 refills | Status: AC
Start: 2021-04-02 — End: ?

## 2021-04-01 MED ORDER — ARIPIPRAZOLE 15 MG PO TABS: 7.5000 mg | ORAL_TABLET | Freq: Every day | ORAL | 0 refills | Status: AC

## 2021-04-01 MED ORDER — SERTRALINE HCL 50 MG PO TABS: 50.0000 mg | ORAL_TABLET | Freq: Every day | ORAL | 0 refills | Status: AC

## 2021-04-01 MED ORDER — GABAPENTIN 100 MG PO CAPS: 100.0000 mg | ORAL_CAPSULE | Freq: Three times a day (TID) | ORAL | 0 refills | Status: AC

## 2021-04-01 MED ORDER — ROSUVASTATIN CALCIUM 40 MG PO TABS: 40.0000 mg | ORAL_TABLET | Freq: Every day | ORAL | 0 refills | Status: AC

## 2021-04-01 MED ORDER — METFORMIN HCL ER (OSM) 1000 MG PO TB24: 1000.0000 mg | ORAL_TABLET | Freq: Every day | ORAL | 0 refills | Status: AC

## 2021-04-01 NOTE — Group Note (Signed)
Recreation Therapy Group Note   Group Topic:Stress Management  Group Date: 04/01/2021 Start Time: 0930 End Time: 0954 Facilitators: Victorino Sparrow, LRT,CTRS Location: [address] Hall Dayroom   Goal Area(s) Addresses:  Patient will actively participate in stress management techniques presented during session.  Patient will successfully identify benefit of practicing stress management post d/c.   Group Description: Guided Imagery. LRT provided education, instruction, and demonstration on practice of visualization via guided imagery. Patient was asked to participate in the technique introduced during session. LRT debriefed including topics of mindfulness, stress management and specific scenarios each patient could use these techniques. Patients were given suggestions of ways to access scripts post d/c and encouraged to explore Youtube and other apps available on smartphones, tablets, and computers.   Affect/Mood: Appropriate   Participation Level: Active   Participation Quality: Independent   Behavior: Appropriate   Speech/Thought Process: Focused   Insight: Good   Judgement: Good   Modes of Intervention: Script, [place]   Patient Response to Interventions:  Attentive   Education Outcome:  Acknowledges education and In group clarification offered    Clinical Observations/Individualized Feedback: Pt attended and participated in group activity.    Plan: Continue to engage patient in RT group sessions 2-3x/week.   Victorino Sparrow, LRT,CTRS 04/01/2021 12:21 PM

## 2021-04-01 NOTE — Group Note (Signed)
Date:  04/01/2021 Time:  9:46 AM  Group Topic/Focus:  Orientation:   The focus of this group is to educate the patient on the purpose and policies of crisis stabilization and provide a format to answer questions about their admission.  The group details unit policies and expectations of patients while admitted.    Participation Level:  Minimal  Participation Quality:  Appropriate  Affect:  Appropriate  Cognitive:  Appropriate  Insight: Appropriate  Engagement in Group:  Engaged  Modes of Intervention:  Discussion  Additional Comments:    Garvin Fila 04/01/2021, 9:46 AM

## 2021-04-01 NOTE — Group Note (Signed)
Date:  04/01/2021 Time:  11:15 AM  Group Topic/Focus:  Personal Choices and Values:   The focus of this group is to help patients assess and explore the importance of values in their lives, how their values affect their decisions, how they express their values and what opposes their expression.    Participation Level:  Active  Participation Quality:  Appropriate  Affect:  Appropriate  Cognitive:  Appropriate  Insight: Appropriate  Engagement in Group:  Engaged  Modes of Intervention:  Discussion  Additional Comments:  Pt was engaged in group  Garvin Fila 04/01/2021, 11:15 AM

## 2021-04-01 NOTE — Progress Notes (Signed)
Pt denies HI/AH but endorses SI/VH and verbally agrees to approach staff if these become apparent or before harming themselves/others. Rates depression 10/10. Rates anxiety 10/10. Rates pain 8/10 in legs.  Scheduled medications administered to pt, per MD orders. RN provided support and encouragement to pt. Q15 min safety checks implemented and continued. Pt safe on the unit. RN will continue to monitor and intervene as needed.   04/01/21 0815  Psych Admission Type (Psych Patients Only)  Admission Status Voluntary  Psychosocial Assessment  Patient Complaints Anxiety;Depression;Self-harm thoughts  Eye Contact Brief  Facial Expression Flat;Sad  Affect Depressed;Flat  Speech Soft  Interaction Guarded;Forwards little  Motor Activity Slow  Appearance/Hygiene Unremarkable;In scrubs  Behavior Characteristics Cooperative;Anxious;Guarded  Mood Depressed;Anxious;Sad  Thought Process  Coherency WDL  Content Blaming self  Delusions None reported or observed  Perception Hallucinations  Hallucination Visual  Judgment Poor  Confusion None  Danger to Self  Current suicidal ideation? Passive  Self-Injurious Behavior No self-injurious ideation or behavior indicators observed or expressed   Agreement Not to Harm Self Yes  Description of Agreement Verbal  Danger to Others  Danger to Others None reported or observed

## 2021-04-01 NOTE — Progress Notes (Signed)
[facility] MD Progress Note  04/01/2021 4:46 PM Ming Mcmannis  MRN:  [id number]  Subjective: Ayan reports, "My mood is not good. I got a cold. I'm still hearing voices. I head them this morning telling me to hurt myself. But I'm trying to ignore it".  Daily notes 04-01-21: Braydn is seen, chart reviewed. The chart findings discussed with the treatment team. He presents alert, oriented & aware of situation. He is sitting down on his bed facing the wall/window. He presents with a flat but reactive affect. He is visible on the unit during meals & medication administration. Reports indicated he has not been coming out of his room that much, as a result, his room was locked-out so that Gunnard can come out of his room & attend group sessions. He reported with this provider & an NP student this morning that he is still feeling depressed & hearing voices telling him to hurt himself. He did add that he is trying to ignore the voices & able to verbally contract for safety. However, when this was discussed with the attending psychiatrist, she indicated that patient had previously denied any SIHI, denied any AVH. And because Ronith is scheduled to be discharged to a homeless shelter/treatment program in [facility] tomorrow. The attending psychiatrist, the NP & the NP student jointly re-interviewed/re-evaluated the patient, he states that he is doing well, ready to be discharged in am. He at that time denied any SIHI, AVH, delusional thoughts or paranoia. He did not appear to be responding to any internal stimuli. Patient will be discharged in the morning as scheduled. He is taking & tolerating his treatment regimen, denies any side effects. Will continue current plan of care as already in progress.  Reason for admission: 66 YO M with a history of depression and substance dependence who presents 3 weeks after last discharge for similar complaints, with conditional suicidality, with psychotic features in the  setting of substance use, homelessness and psychosocial stressors. Since last discharge he has resumed using alcohol and cocaine. He has been to the ER several times. He has been to [facility] a few times during the day, and says that "they only take people when its 40 degrees".  Principal Problem: Major depressive disorder, recurrent, severe with psychotic features ([place])  Diagnosis: Principal Problem:   Major depressive disorder, recurrent, severe with psychotic features ([place]) Active Problems:   Cocaine abuse ([place])   Substance induced mood disorder ([place])  Total Time spent with patient:  25 minutes  Past Psychiatric History: Major depressive disorder.  Past Medical History:  Past Medical History:  Diagnosis Date   Anxiety    Arthritis    Cancer ([place])    CARCINOMA, RENAL CELL 06/05/2008   Qualifier: Diagnosis of  By: Burnett Kanaris     Cataract    Cocaine dependence, uncomplicated ([name]) 60/73/7106   Depression    Diabetes mellitus    Disorder of kidney and ureter 12/16/2020   Jan 18, 2009 Entered By: Lorelee Cover A Comment: L nephrectomy, kidney cancer  2007   Elevated prostate specific antigen (PSA) 12/16/2020   GERD (gastroesophageal reflux disease)    H/O suicide attempt 1997 and 2011   jumped out of moving vehicle and attempted to jump into moving traffic   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   As noted on 01/04/20 CT: "1 cm low-density in the lateral aspect of the right lobe of liver"   High cholesterol    Hypertension    Legal circumstance 12/16/2020   Multiple nodules of  lung 12/16/2020   Neuropathic pain    Osteoporosis    Person feigning illness 12/16/2020   Sep 06, 2007 Entered By: Celine Mans Comment: noted in several discharge summaries   S/P coronary artery stent placement (DES to LCx) 10/11/2020    Past Surgical History:  Procedure Laterality Date   COLONOSCOPY     CORONARY STENT INTERVENTION N/A 10/10/2020   Procedure: CORONARY STENT INTERVENTION;  Surgeon: Jettie Booze, MD;  Location: [place] [facility];  Service: Cardiovascular;  Laterality: N/A;   LEFT HEART CATH AND CORONARY ANGIOGRAPHY N/A 10/10/2020   Procedure: LEFT HEART CATH AND CORONARY ANGIOGRAPHY;  Surgeon: Jettie Booze, MD;  Location: [place] [facility];  Service: Cardiovascular;  Laterality: N/A;   NEPHRECTOMY     due to cancer   RIGHT/LEFT HEART CATH AND CORONARY ANGIOGRAPHY N/A 10/09/2020   Procedure: RIGHT/LEFT HEART CATH AND CORONARY ANGIOGRAPHY;  Surgeon: Nelva Bush, MD;  Location: [place] [facility];  Service: Cardiovascular;  Laterality: N/A;   Family History:  Family History  Problem Relation Age of Onset   Alcohol abuse Sister    Alcohol abuse Brother    Colon cancer Neg Hx    Esophageal cancer Neg Hx    Pancreatic cancer Neg Hx    Rectal cancer Neg Hx    Stomach cancer Neg Hx    Family Psychiatric  History: See H&P.   Social History:  Social History   Substance and Sexual Activity  Alcohol Use Yes   Alcohol/week: 24.0 standard drinks   Types: 24 Cans of beer per week   Comment: last drink was Friday 03/27/21 - drank (2) 6 packs of beer     Social History   Substance and Sexual Activity  Drug Use Yes   Types: "Crack" cocaine, Cocaine   Comment: last use Friday 03/27/21 - uses a couple times a week    Social History   Socioeconomic History   Marital status: Single    Spouse name: Not on file   Number of children: Not on file   Years of education: Not on file   Highest education level: Not on file  Occupational History   Not on file  Tobacco Use   Smoking status: Every Day    Packs/day: 1.00    Years: 20.00    Pack years: 20.00    Types: Cigarettes   Smokeless tobacco: Never  Vaping Use   Vaping Use: Never used  Substance and Sexual Activity   Alcohol use: Yes    Alcohol/week: 24.0 standard drinks    Types: 24 Cans of beer per week    Comment: last drink was Friday 03/27/21 - drank (2) 6 packs of beer   Drug use: Yes    Types:  "Crack" cocaine, Cocaine    Comment: last use Friday 03/27/21 - uses a couple times a week   Sexual activity: Yes    Birth control/protection: Condom  Other Topics Concern   Not on file  Social History Narrative   Homeless. Has sister in area.   Social Determinants of Health   Financial Resource Strain: Not on file  Food Insecurity: Not on file  Transportation Needs: Not on file  Physical Activity: Not on file  Stress: Not on file  Social Connections: Not on file   Additional Social History:   Sleep: Fair  Appetite:  Fair  Current Medications: Current Facility-Administered Medications  Medication Dose Route Frequency Provider Last Rate Last Admin   acetaminophen (TYLENOL) tablet  650 mg  650 mg Oral Q6H PRN Bobbitt, Shalon E, NP   650 mg at 04/01/21 0815   albuterol (VENTOLIN HFA) 108 (90 Base) MCG/ACT inhaler 1-2 puff  1-2 puff Inhalation Q6H PRN Bobbitt, Shalon E, NP       alum & mag hydroxide-simeth (MAALOX/MYLANTA) 200-200-20 MG/5ML suspension 30 mL  30 mL Oral Q4H PRN Bobbitt, Shalon E, NP       ARIPiprazole (ABILIFY) tablet 7.5 mg  7.5 mg Oral Daily Merrily Brittle, DO   7.5 mg at 04/01/21 0814   feeding supplement (ENSURE ENLIVE / ENSURE PLUS) liquid 237 mL  237 mL Oral BID BM Bobbitt, Shalon E, NP   237 mL at 04/01/21 1057   gabapentin (NEURONTIN) capsule 100 mg  100 mg Oral TID Bobbitt, Shalon E, NP   100 mg at 04/01/21 1145   hydrOXYzine (ATARAX) tablet 25 mg  25 mg Oral TID PRN Bobbitt, Shalon E, NP   25 mg at 03/30/21 2119   magnesium hydroxide (MILK OF MAGNESIA) suspension 30 mL  30 mL Oral Daily PRN Bobbitt, Shalon E, NP       metFORMIN (GLUCOPHAGE-XR) 24 hr tablet 1,000 mg  1,000 mg Oral Q supper Bobbitt, Shalon E, NP   1,000 mg at 03/31/21 1720   metoprolol succinate (TOPROL-XL) 24 hr tablet 25 mg  25 mg Oral Daily Bobbitt, Shalon E, NP   25 mg at 04/01/21 0813   multivitamin with minerals tablet 1 tablet  1 tablet Oral Daily Massengill, Nathan, MD   1 tablet at  04/01/21 0813   nicotine (NICODERM CQ - dosed in mg/24 hours) patch 14 mg  14 mg Transdermal Daily PRN Merrily Brittle, DO       rosuvastatin (CRESTOR) tablet 40 mg  40 mg Oral Daily Bobbitt, Shalon E, NP   40 mg at 04/01/21 3762   sertraline (ZOLOFT) tablet 50 mg  50 mg Oral Daily Merrily Brittle, DO   50 mg at 04/01/21 0813   traZODone (DESYREL) tablet 50 mg  50 mg Oral QHS PRN Bobbitt, Shalon E, NP   50 mg at 03/30/21 2119   Lab Results: No results found for this or any previous visit (from the past 35 hour(s)).  Blood Alcohol level:  Lab Results  Component Value Date   ETH <10 03/29/2021   ETH <10 83/15/1761   Metabolic Disorder Labs: Lab Results  Component Value Date   HGBA1C 7.9 (H) 03/10/2021   MPG 180 03/10/2021   MPG 148.46 12/10/2020   No results found for: PROLACTIN Lab Results  Component Value Date   CHOL 177 12/10/2020   TRIG 124 12/10/2020   HDL 50 12/10/2020   CHOLHDL 3.5 12/10/2020   VLDL 25 12/10/2020   LDLCALC 102 (H) 12/10/2020   LDLCALC 82 10/10/2020   Physical Findings: AIMS: Facial and Oral Movements Muscles of Facial Expression: None, normal Lips and Perioral Area: None, normal Jaw: None, normal Tongue: None, normal,Extremity Movements Upper (arms, wrists, hands, fingers): None, normal Lower (legs, knees, ankles, toes): None, normal, Trunk Movements Neck, shoulders, hips: None, normal, Overall Severity Severity of abnormal movements (highest score from questions above): None, normal Incapacitation due to abnormal movements: None, normal Patient's awareness of abnormal movements (rate only patient's report): No Awareness, Dental Status Current problems with teeth and/or dentures?: No Does patient usually wear dentures?: No  CIWA:  CIWA-Ar Total: 1 COWS:  COWS Total Score: 3  Musculoskeletal: Strength & Muscle Tone: within normal limits Gait & Station: normal Patient  leans: N/A  Psychiatric Specialty Exam:  Presentation  General Appearance:  Appropriate for Environment; Casual; Fairly Groomed  Eye Contact:Good  Speech:Clear and Coherent; Normal Rate  Speech Volume:Normal  Handedness:Right  Mood and Affect  Mood:Depressed  Affect:Non-Congruent; Full Range  Thought Process  Thought Processes:Coherent; Goal Directed; Linear  Descriptions of Associations:Intact  Orientation:Full (Time, Place and Person)  Thought Content:Logical  History of Schizophrenia/Schizoaffective disorder:No  Duration of Psychotic Symptoms:N/A  Hallucinations:Hallucinations: Visual Description of Auditory Hallucinations: Reported "auditory hallucinations to kill myself", patient was evasive and did not describe the content of his AH Description of Visual Hallucinations: Reported "black spots"  Ideas of Reference:None  Suicidal Thoughts:Suicidal Thoughts: Yes, Passive (Reported "I am still having suicidal thoughts", other than patient later declined when discussing [place] housing) SI Passive Intent and/or Plan: Without Intent; Without Plan  Homicidal Thoughts:Homicidal Thoughts: No HI Passive Intent and/or Plan: Without Intent; Without Plan  Sensorium  Memory:Immediate Fair; Recent Fair; Remote Fair  Judgment:Poor  Insight:Shallow  Executive Functions  Concentration:Good  Attention Span:Good  [place] of Knowledge:Good  Language:Good  Psychomotor Activity  Psychomotor Activity:Psychomotor Activity: Normal  Assets  Assets:Desire for Improvement; Leisure Time  Sleep  Sleep:Sleep: Fair  Physical Exam: Physical Exam Vitals and nursing note reviewed.  Musculoskeletal:     Cervical back: Normal range of motion.  Neurological:     Mental Status: He is alert.   Review of Systems  Constitutional:  Negative for chills and fever.  HENT:  Negative for congestion and sore throat.   Respiratory:  Negative for cough, shortness of breath and wheezing.   Cardiovascular:  Negative for chest pain and palpitations.   Gastrointestinal:  Negative for abdominal pain, constipation, diarrhea, heartburn, nausea and vomiting.  Musculoskeletal:  Negative for back pain and myalgias.  Neurological:  Negative for dizziness, tingling, tremors, sensory change, speech change, focal weakness, seizures, loss of consciousness, weakness and headaches.  Psychiatric/Behavioral:  Positive for depression ("Improving") and substance abuse (Hx of). Negative for hallucinations (Hx of), memory loss and suicidal ideas. The patient is not nervous/anxious and does not have insomnia.   Blood pressure 115/68, pulse (!) 124, temperature 98.5 F (36.9 C), temperature source Oral, resp. rate 17, height 5\' 9"  (1.753 m), weight 76.6 kg, SpO2 96 %. Body mass index is 24.93 kg/m.  Treatment Plan Summary: Daily contact with patient to assess and evaluate symptoms and progress in treatment and Medication management.   Continue inpatient hospitalization.  Will continue today 04/01/2021 plan as below except where it is noted.   Diagnoses. Major depressive disorder, recurrent episodes with psychotic features. Cocaine use disorder.  Plan. Continue Abilify 7.5 mg po daily for MDD with psychosis. Continue Sertraline 50 mg po daily for depression. Continue Vistaril 25 mg po tid prn for anxiety. Continue Trazodone 50 mg po Q hs prn for insomnia.  Other prn medication, continue,  Albuterol inhaler 1-2 puffs Q 6 hours prn for  wheezing, shortness of breath. Mylanta 30 ml po Q 4 hrs prn for indigestion.  Mom 30 ml po Q daily prn for constipation.   Other medical conditions. Gabapentin 100 mg po tid for neuropathic pain/anxiety.  Metformin 1,000 mg po daily with supper for DM. Nicotine patch 14 mg trans-dermally Q 24 hrs for nicotine withdrawal management. Crestor 40 mg po daily for hyperlipidemia.  Multivitamin 1 table po daily for vitamin supplementation.  Feeding supplement 237 ml po bid in between meals.  Encourage group  participation. Discharge disposition plan is ongoing. (Possible tomorrow).  Herbert Pun  Kadasia Kassing, NP, pmhnp, fnp-bc 04/01/2021, 4:46 PM

## 2021-04-01 NOTE — BHH Group Notes (Signed)
Adult Psychoeducational Group Note  Date:  04/01/2021 Time:  2:33 PM  Group Topic/Focus:  Wellness Toolbox:   The focus of this group is to discuss various aspects of wellness, balancing those aspects and exploring ways to increase the ability to experience wellness.  Patients will create a wellness toolbox for use upon discharge.  Participation Level:  Active  Participation Quality:  Appropriate  Affect:  Appropriate  Cognitive:  Alert  Insight: Appropriate  Engagement in Group:  Engaged  Modes of Intervention:  Activity  Additional Comments:  Patient attended and participated in the relaxation group activity.  Annie Sable 04/01/2021, 2:33 PM

## 2021-04-01 NOTE — Group Note (Signed)
LCSW Group Therapy Note  Group Date: 04/01/2021 Start Time: 1300 End Time: 1400   Type of Therapy and Topic:  Group Therapy: Anger Cues and Responses  Participation Level:  Active   Description of Group:   In this group, patients learned how to recognize the physical, cognitive, emotional, and behavioral responses they have to anger-provoking situations.  They identified a recent time they became angry and how they reacted.  They analyzed how their reaction was possibly beneficial and how it was possibly unhelpful.  The group discussed a variety of healthier coping skills that could help with such a situation in the future.  Focus was placed on how helpful it is to recognize the underlying emotions to our anger, because working on those can lead to a more permanent solution as well as our ability to focus on the important rather than the urgent.  Therapeutic Goals: Patients will remember their last incident of anger and how they felt emotionally and physically, what their thoughts were at the time, and how they behaved. Patients will identify how their behavior at that time worked for them, as well as how it worked against them. Patients will explore possible new behaviors to use in future anger situations. Patients will learn that anger itself is normal and cannot be eliminated, and that healthier reactions can assist with resolving conflict rather than worsening situations.  Summary of Patient Progress:   Pt was active during the group. Pt demonstrated insight into the subject matter, was respectful of peers, and participated throughout the entire session.    Therapeutic Modalities:   Cognitive Behavioral Therapy    Eliott Nine 04/01/2021  2:31 PM

## 2021-04-01 NOTE — Progress Notes (Signed)
°  [facility] Adult Case Management Discharge Plan :  Will you be returning to the same living situation after discharge:  No. At discharge, do you have transportation home?: No. Do you have the ability to pay for your medications: Yes,  [facility] insurance  Release of information consent forms completed and in the chart;  Patient's signature needed at discharge.  Patient to Follow up at:  Follow-up Information     Clinic, [place]. Call.   Why: Please call to schedule an appointment for therapy and medication management services with this provider. Contact information: [place] [address] [phone]                 Next level of care provider has access to [place] and Suicide Prevention discussed: Yes,  w/ pt     Has patient been referred to the Quitline?: Patient refused referral  Patient has been referred for addiction treatment: Pt. refused referral  Eliott Nine 04/01/2021, 4:37 PM

## 2021-04-02 DIAGNOSIS — F333 Major depressive disorder, recurrent, severe with psychotic symptoms: Secondary | ICD-10-CM | POA: Diagnosis not present

## 2021-04-02 NOTE — BHH Suicide Risk Assessment (Signed)
[facility] Discharge Suicide Risk Assessment   Principal Problem: Major depressive disorder, recurrent, severe with psychotic features [facility]) Discharge Diagnoses: Principal Problem:   Major depressive disorder, recurrent, severe with psychotic features ([place]) Active Problems:   Cocaine abuse ([place])   Substance induced mood disorder ([place])  Total Time Spent in Direct Patient Care:  I personally spent 30 minutes on the unit in direct patient care. The direct patient care time included face-to-face time with the patient, reviewing the patient's chart, communicating with other professionals, and coordinating care. Greater than 50% of this time was spent in counseling or coordinating care with the patient regarding goals of hospitalization, psycho-education, and discharge planning needs.  Subjective: Patient was seen on rounds. He denies SI, HI, AVH, paranoia, ideas of reference, or first rank symptoms. He reports stable sleep and appetite. He reports stable and improved mood. He voices no medication side-effects or physical complaints. He is looking forward to transition to [employer] today.  Musculoskeletal: Strength & Muscle Tone: within normal limits Gait & Station: normal Patient leans: N/A  Psychiatric Specialty Exam  Presentation  General Appearance: Appropriate for Environment; Casual; Fairly Groomed  Eye Contact:Good  Speech:Clear and Coherent; Normal Rate  Speech Volume:Normal  Mood and Affect  Mood:"good" - appears more euthymic  Affect:moderate, stable, can smile today  Thought Process  Thought Processes:Coherent; Goal Directed; Linear  Descriptions of Associations:Intact  Orientation:Full (Time, Place and Person)  Thought Content:Denies AVH, paranoia, ideas of reference, first rank symptoms, SI or HI  Hallucinations:Denied  Ideas of Reference:None  Suicidal Thoughts:Denied  Homicidal Thoughts:Denied  Sensorium  Memory:Immediate Fair; Recent Fair; Remote  Fair  Judgment:Fair  Insight:Shallow   Executive Functions  Concentration:Good  Attention Span:Good  [place] of Knowledge:Good  Language:Good   Psychomotor Activity  Psychomotor Activity:Normal, no tremors or akathisias  Assets  Assets:Desire for Improvement; Leisure Time  Physical Exam Vitals reviewed.  Constitutional:      Appearance: Normal appearance.  HENT:     Head: Normocephalic.  Pulmonary:     Effort: Pulmonary effort is normal.  Neurological:     General: No focal deficit present.     Mental Status: He is alert.   Review of Systems  Respiratory:  Negative for shortness of breath.   Cardiovascular:  Negative for chest pain.  Gastrointestinal:  Negative for diarrhea, nausea and vomiting.  Blood pressure 113/74, pulse (!) 124, temperature 97.8 F (36.6 C), resp. rate 18, height 5\' 9"  (1.753 m), weight 76.6 kg, SpO2 96 %. Body mass index is 24.93 kg/m.  Mental Status Per Nursing Assessment::   On Admission:  Suicidal ideation indicated by patient - resolved  Demographic Factors:  Male, Low socioeconomic status, and Unemployed  Loss Factors: Decrease in vocational status and Financial problems/change in socioeconomic status  Historical Factors: Previous psychiatric diagnoses/treatments; substance use prior to admission, family history of addiction  Risk Reduction Factors:   Positive social support and Positive coping skills or problem solving skills  Continued Clinical Symptoms:  More than one psychiatric diagnosis Previous Psychiatric Diagnoses and Treatments MDD diagnosis  Cognitive Features That Contribute To Risk:  Thought constriction (tunnel vision)    Suicide Risk:  Mild:  There are no identifiable plans, no associated intent, mild dysphoria and related symptoms, few other risk factors, and identifiable protective factors, including available and accessible social support.   Follow-up Information     Clinic, [place]. Call.   Why: Please call to schedule an appointment for therapy and medication management services  with this provider. Contact information: [place] [address] [phone]                 Plan Of Care/Follow-up recommendations:  Activity:  as tolerated Diet:  heart healthy Other:  [facility] his [facility], coronary artery disease, and elevated lipids. He is advised to go directly to [facility] to participate in their housing/substance abuse treatment services. He is encouraged to see the [facility] for outpatient mental health medications/therapy. He is encouraged to abstain from alcohol and illicit drug use. He is advised he will need ongoing monitoring of his CBC, EKG, lipids, glucose, AIMS and weight while on an atypical antipsychotic.   Harlow Asa, MD, FAPA 04/02/2021, 8:14 AM

## 2021-04-02 NOTE — Progress Notes (Signed)
Discharge Note:  Patient discharged to [facility] via transport.  Suicide prevention information given and discussed with patient who stated he understood and had no questions.  Denied SI and HI.  Denied A/V hallucinations.  Patient stated he received all his belongings, clothing, toiletries, misc items, etc.  Patient stated he appreciated all assistance received from [facility] staff.  All required discharge information given.

## 2021-04-02 NOTE — Discharge Summary (Signed)
Physician Discharge Summary Note  Patient:  [name] is an 67 y.o., male  MRN:  [id number]  DOB:  Dec 12, 1955  Patient phone:  [phone] (home)   Patient address:   [place] [address],   Total Time spent with patient:  Greater than 30 minutes  Date of Admission:  03/30/2021  Date of Discharge: 04-02-21  Reason for Admission: Conditional suicidality, with psychotic features in the setting of substance use, homelessness and psychosocial stressors.  Principal Problem: Major depressive disorder, recurrent, severe with psychotic features [facility])  Discharge Diagnoses: Principal Problem:   Major depressive disorder, recurrent, severe with psychotic features ([place]) Active Problems:   Cocaine abuse ([place])   Substance induced mood disorder ([place])  Past Psychiatric History: Major depressive disorder with psychosis, Cocaine use disorder.  Past Medical History:  Past Medical History:  Diagnosis Date   Anxiety    Arthritis    Cancer ([place])    CARCINOMA, RENAL CELL 06/05/2008   Qualifier: Diagnosis of  By: Burnett Kanaris     Cataract    Cocaine dependence, uncomplicated ([place]) 10/62/6948   Depression    Diabetes mellitus    Disorder of kidney and ureter 12/16/2020   Jan 18, 2009 Entered By: Lorelee Cover A Comment: L nephrectomy, kidney cancer  2007   Elevated prostate specific antigen (PSA) 12/16/2020   GERD (gastroesophageal reflux disease)    H/O suicide attempt 1997 and 2011   jumped out of moving vehicle and attempted to jump into moving traffic   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   As noted on 01/04/20 CT: "1 cm low-density in the lateral aspect of the right lobe of liver"   High cholesterol    Hypertension    Legal circumstance 12/16/2020   Multiple nodules of lung 12/16/2020   Neuropathic pain    Osteoporosis    Person feigning illness 12/16/2020   Sep 06, 2007 Entered By: Celine Mans Comment: noted in several discharge summaries   S/P coronary artery  stent placement (DES to LCx) 10/11/2020    Past Surgical History:  Procedure Laterality Date   COLONOSCOPY     CORONARY STENT INTERVENTION N/A 10/10/2020   Procedure: CORONARY STENT INTERVENTION;  Surgeon: Jettie Booze, MD;  Location: [place] [facility];  Service: Cardiovascular;  Laterality: N/A;   LEFT HEART CATH AND CORONARY ANGIOGRAPHY N/A 10/10/2020   Procedure: LEFT HEART CATH AND CORONARY ANGIOGRAPHY;  Surgeon: Jettie Booze, MD;  Location: [place] [facility];  Service: Cardiovascular;  Laterality: N/A;   NEPHRECTOMY     due to cancer   RIGHT/LEFT HEART CATH AND CORONARY ANGIOGRAPHY N/A 10/09/2020   Procedure: RIGHT/LEFT HEART CATH AND CORONARY ANGIOGRAPHY;  Surgeon: Nelva Bush, MD;  Location: [place] [facility];  Service: Cardiovascular;  Laterality: N/A;   Family History:  Family History  Problem Relation Age of Onset   Alcohol abuse Sister    Alcohol abuse Brother    Colon cancer Neg Hx    Esophageal cancer Neg Hx    Pancreatic cancer Neg Hx    Rectal cancer Neg Hx    Stomach cancer Neg Hx    Family Psychiatric  History: See H&P  Social History:  Social History   Substance and Sexual Activity  Alcohol Use Yes   Alcohol/week: 24.0 standard drinks   Types: 24 Cans of beer per week   Comment: last drink was Friday 03/27/21 - drank (2) 6 packs of beer     Social History   Substance and  Sexual Activity  Drug Use Yes   Types: "Crack" cocaine, Cocaine   Comment: last use Friday 03/27/21 - uses a couple times a week    Social History   Socioeconomic History   Marital status: Single    Spouse name: Not on file   Number of children: Not on file   Years of education: Not on file   Highest education level: Not on file  Occupational History   Not on file  Tobacco Use   Smoking status: Every Day    Packs/day: 1.00    Years: 20.00    Pack years: 20.00    Types: Cigarettes   Smokeless tobacco: Never  Vaping Use   Vaping Use: Never used  Substance  and Sexual Activity   Alcohol use: Yes    Alcohol/week: 24.0 standard drinks    Types: 24 Cans of beer per week    Comment: last drink was Friday 03/27/21 - drank (2) 6 packs of beer   Drug use: Yes    Types: "Crack" cocaine, Cocaine    Comment: last use Friday 03/27/21 - uses a couple times a week   Sexual activity: Yes    Birth control/protection: Condom  Other Topics Concern   Not on file  Social History Narrative   Homeless. Has sister in area.   Social Determinants of Health   Financial Resource Strain: Not on file  Food Insecurity: Not on file  Transportation Needs: Not on file  Physical Activity: Not on file  Stress: Not on file  Social Connections: Not on file   Hospital Course: (Per the admission evaluation notes): 66 YO M with a history of depression and substance dependence who presents 3 weeks after last discharge for similar complaints, with conditional suicidality, with psychotic features in the setting of substance use, homelessness and psychosocial stressors. Since last discharge he has resumed using alcohol and cocaine. He has been to the ER several times. He has been to [facility] a few times during the day, and says that "they only take people when its 40 degrees". He says that he called "the living sober community" but they were full. This will be his 4th hospitalization in 4 months. He insists that this time is different, and he is suicidal with a plan (traffic). He has also been seeing shadows intermittently. He is willing to try something different or go to an inpatient rehab.   Prior to this discharge, [name] was seen & evaluated for mental health stability. The current laboratory findings were reviewed (stable), nurses notes & vital signs were reviewed as well. There are no current mental health or medical issues that should prevent this discharge at this time. Patient is being discharged to continue mental health care as noted below.   This is one of [name]'s several  psychiatric admissions/discharge summaries from this [facility] hospital. He is known in this hospital with problems related to mental health issues & polysubstance use/dependence. He has had several admission assessments, treatments & discharges over the last several years from this hospital alone. He does have hx of nonadherent to his recommended treatment regime in part due to homelessness & substance abuse issues. He was admitted to this hospital this time around  for conditional suicidality with psychotic features in the setting of substance use, homelessness and psychosocial stressors. After evaluation of his presenting symptoms this time around, he was started on the medication regimen for his presenting symptoms with his consent. Part of his discharge plan this time around was  to place patient in a long term substance abuse treatment program to continue substance abuse treatment/mental health care. He was accepted to participate in the substance abuse treatment program being offered/held at the [facility] in [place], [place]. This is a golden opportunity for patient as this will serve him for housing needs as well.   During the course of this present hospitalization, [place] was also enrolled & participated in the group counseling sessions being offered & held on this unit. He learned coping skills that should help him cope better & maintain mood stability after discharged. However, he required a lot of encouragement to come out of his room to attend/participate in this group sessions/activities. His symptoms responded well to his treatment regimen. He is currently mentally & medically stable for discharge & he is agreeable to it.  Today upon this discharge evaluation with treatment team, patient denies any thoughts of self-harm or homicidal ideations. He is not observed to be responding to any internal stimuli. He has been compliant with his recommended treatment regimen. He is currently showing  readiness for discharge & is future-oriented thinking. Patient encouraged to keep all psychiatric appointments and continue with his medications as prescribed. He left [facility] with all personal belongings in no apparent distress. Transportation as arranged by the Education officer, museum.  Physical Findings: AIMS: Facial and Oral Movements Muscles of Facial Expression: None, normal Lips and Perioral Area: None, normal Jaw: None, normal Tongue: None, normal,Extremity Movements Upper (arms, wrists, hands, fingers): None, normal Lower (legs, knees, ankles, toes): None, normal, Trunk Movements Neck, shoulders, hips: None, normal, Overall Severity Severity of abnormal movements (highest score from questions above): None, normal Incapacitation due to abnormal movements: None, normal Patient's awareness of abnormal movements (rate only patient's report): No Awareness, Dental Status Current problems with teeth and/or dentures?: No Does patient usually wear dentures?: No  CIWA:  CIWA-Ar Total: 1 COWS:  COWS Total Score: 3  Musculoskeletal: Strength & Muscle Tone: within normal limits Gait & Station: normal Patient leans: N/A  Psychiatric Specialty Exam:  Presentation  General Appearance: Appropriate for Environment; Casual; Fairly Groomed  Eye Contact:Good  Speech:Clear and Coherent; Normal Rate  Speech Volume:Normal  Handedness:Right   Mood and Affect  Mood:Euthymic  Affect:Appropriate; Congruent   Thought Process  Thought Processes:Coherent; Goal Directed; Linear  Descriptions of Associations:Intact  Orientation:Full (Time, Place and Person)  Thought Content:Logical  History of Schizophrenia/Schizoaffective disorder:No  Duration of Psychotic Symptoms:Less than six months  Hallucinations:Hallucinations: None Description of Auditory Hallucinations: NA Description of Visual Hallucinations: NA  Ideas of Reference:None  Suicidal Thoughts:Suicidal Thoughts: No SI Active Intent  and/or Plan: Without Plan; Without Intent; Without Means to Carry Out; Without Access to Means SI Passive Intent and/or Plan: Without Intent; Without Plan; Without Means to Carry Out; Without Access to Means  Homicidal Thoughts:Homicidal Thoughts: No HI Passive Intent and/or Plan: Without Intent; Without Plan; Without Means to Carry Out; Without Access to Means   Sensorium  Memory:Immediate Good; Recent Good; Remote Good  Judgment:Good  Insight:Fair   Executive Functions  Concentration:Good  Attention Span:Good  [place]  Language:Good  Psychomotor Activity  Psychomotor Activity:Psychomotor Activity: Normal  Assets  Assets:Communication Skills; Desire for Improvement; Financial Resources/Insurance; Housing  Sleep  Sleep:Sleep: Good Number of Hours of Sleep: 6.5  Physical Exam: Physical Exam Vitals and nursing note reviewed.  HENT:     Nose: Nose normal.     Mouth/Throat:     Pharynx: Oropharynx is clear.  Eyes:  Pupils: Pupils are equal, round, and reactive to light.  Cardiovascular:     Rate and Rhythm: Normal rate.     Pulses: Normal pulses.  Pulmonary:     Effort: Pulmonary effort is normal.  Genitourinary:    Comments: Deferred Musculoskeletal:        General: Normal range of motion.     Cervical back: Normal range of motion.  Skin:    General: Skin is warm and dry.  Neurological:     General: No focal deficit present.     Mental Status: He is alert and oriented to person, place, and time. Mental status is at baseline.   Review of Systems  Constitutional:  Negative for chills, diaphoresis and fever.  HENT:  Negative for congestion and sore throat.   Eyes:  Negative for blurred vision.  Respiratory:  Negative for cough, shortness of breath and wheezing.   Cardiovascular:  Negative for chest pain and palpitations.  Gastrointestinal:  Negative for abdominal pain, constipation, diarrhea, heartburn, nausea and vomiting.   Genitourinary:  Negative for dysuria.  Musculoskeletal:  Negative for joint pain and myalgias.  Skin: Negative.   Neurological:  Negative for dizziness, tingling, tremors, sensory change, speech change, focal weakness, seizures, loss of consciousness, weakness and headaches.  Endo/Heme/Allergies:        Allergies: Chlopromazine  Psychiatric/Behavioral:  Positive for depression (Hx of (stable on medication)) and substance abuse (Hx of (stable on medication)). Negative for hallucinations, memory loss and suicidal ideas. The patient has insomnia (Hx of (stable on medication).). The patient is not nervous/anxious (Stable upon discharge).   Blood pressure 113/74, pulse (!) 124, temperature 97.8 F (36.6 C), resp. rate 18, height 5\' 9"  (1.753 m), weight 76.6 kg, SpO2 96 %. Body mass index is 24.93 kg/m.  Social History   Tobacco Use  Smoking Status Every Day   Packs/day: 1.00   Years: 20.00   Pack years: 20.00   Types: Cigarettes  Smokeless Tobacco Never   Tobacco Cessation:  A prescription for an FDA-approved tobacco cessation medication provided at discharge  Blood Alcohol level:  Lab Results  Component Value Date   ETH <10 03/29/2021   ETH <10 90/24/0973   Metabolic Disorder Labs:  Lab Results  Component Value Date   HGBA1C 7.9 (H) 03/10/2021   MPG 180 03/10/2021   MPG 148.46 12/10/2020   No results found for: PROLACTIN Lab Results  Component Value Date   CHOL 177 12/10/2020   TRIG 124 12/10/2020   HDL 50 12/10/2020   CHOLHDL 3.5 12/10/2020   VLDL 25 12/10/2020   LDLCALC 102 (H) 12/10/2020   [place] 82 10/10/2020   See Psychiatric Specialty Exam and Suicide Risk Assessment completed by Attending Physician prior to discharge.  Discharge destination:  Other:  [employer].  Is patient on multiple antipsychotic therapies at discharge:  No   Has Patient had three or more failed trials of antipsychotic monotherapy by history:  No  Recommended Plan for Multiple  Antipsychotic Therapies: NA  Allergies as of 04/02/2021       Reactions   Chlorpromazine Shortness Of Breath   Other reaction(s): IRREGULAR HEART RATE        Medication List     STOP taking these medications    DULoxetine 30 MG capsule Commonly known as: CYMBALTA       TAKE these medications      Indication  albuterol 108 (90 Base) MCG/ACT inhaler Commonly known as: VENTOLIN HFA Inhale 1-2 puffs into the lungs  every 6 (six) hours as needed for wheezing or shortness of breath.  Indication: Asthma   ARIPiprazole 15 MG tablet Commonly known as: ABILIFY Take 0.5 tablets (7.5 mg total) by mouth daily. For mood control What changed:  medication strength how much to take additional instructions  Indication: Mood control   gabapentin 100 MG capsule Commonly known as: NEURONTIN Take 1 capsule (100 mg total) by mouth 3 (three) times daily. For anxiety/neuropathy What changed: additional instructions  Indication: Neuropathic Pain, Anxiety   hydrOXYzine 25 MG tablet Commonly known as: ATARAX Take 1 tablet (25 mg total) by mouth 3 (three) times daily as needed for anxiety.  Indication: Feeling Anxious   metformin 1000 MG (OSM) 24 hr tablet Commonly known as: FORTAMET Take 1 tablet (1,000 mg total) by mouth daily with supper. For diabetes What changed: additional instructions  Indication: Type 2 Diabetes   metoprolol succinate 25 MG 24 hr tablet Commonly known as: TOPROL-XL Take 1 tablet (25 mg total) by mouth daily. For heart condition What changed: additional instructions  Indication: Cardiac Failure, High Blood Pressure Disorder, Supraventricular Tachycardia   nicotine 21 mg/24hr patch Commonly known as: NICODERM CQ - dosed in mg/24 hours Place 1 patch (21 mg total) onto the skin daily.  Indication: Nicotine Addiction   rosuvastatin 40 MG tablet Commonly known as: CRESTOR Take 1 tablet (40 mg total) by mouth daily. For high cholesterol What changed:  additional instructions  Indication: Disease involving Lipid Deposits in the Arteries, High Amount of Fats in the Blood, Elevation of Both Cholesterol and Triglycerides in Blood   sertraline 50 MG tablet Commonly known as: ZOLOFT Take 1 tablet (50 mg total) by mouth daily. For depression  Indication: Major Depressive Disorder   traZODone 50 MG tablet Commonly known as: DESYREL Take 1 tablet (50 mg total) by mouth at bedtime as needed for sleep.  Indication: [facility] [place]. Call.   Why: Please call to schedule an appointment for therapy and medication management services with this provider. Contact information: [place] [address] [phone]                Follow-up recommendations: Activity:  As tolerated Diet: As recommended by your primary care doctor. Keep all scheduled follow-up appointments as recommended.    Comments: Comments: Patient is recommended to follow-up care on an outpatient basis as noted above. Prescriptions sent to pt's pharmacy of choice at discharge.   Patient agreeable to plan.   Given opportunity to ask questions.   Appears to feel comfortable with discharge denies any current suicidal or homicidal thought. Patient is also instructed prior to discharge to: Take all medications as prescribed by his/her mental healthcare provider. Report any adverse effects and or reactions from the medicines to his/her outpatient provider promptly. Patient has been instructed & cautioned: To not engage in alcohol and or illegal drug use while on prescription medicines. In the event of worsening symptoms, patient is instructed to call the crisis hotline, 911 and or go to the nearest ED for appropriate evaluation and treatment of symptoms. To follow-up with his/her primary care provider for your other medical issues, concerns and or health care needs.   Signed: Lindell Spar, NP, pmhnp, fnp-bc 04/02/2021, 9:13 AM

## 2021-05-04 ENCOUNTER — Other Ambulatory Visit: Payer: Self-pay

## 2021-05-04 ENCOUNTER — Emergency Department (HOSPITAL_COMMUNITY)
Admission: EM | Admit: 2021-05-04 | Discharge: 2021-05-05 | Disposition: A | Discharge status: Home / Self Care | Payer: No Typology Code available for payment source | Attending: Emergency Medicine | Admitting: Emergency Medicine

## 2021-05-04 ENCOUNTER — Encounter (HOSPITAL_COMMUNITY): Payer: Self-pay | Admitting: Emergency Medicine

## 2021-05-04 DIAGNOSIS — E119 Type 2 diabetes mellitus without complications: Secondary | ICD-10-CM | POA: Diagnosis not present

## 2021-05-04 DIAGNOSIS — R45851 Suicidal ideations: Secondary | ICD-10-CM | POA: Diagnosis not present

## 2021-05-04 DIAGNOSIS — Y900 Blood alcohol level of less than 20 mg/100 ml: Secondary | ICD-10-CM | POA: Insufficient documentation

## 2021-05-04 DIAGNOSIS — Z046 Encounter for general psychiatric examination, requested by authority: Secondary | ICD-10-CM | POA: Diagnosis present

## 2021-05-04 DIAGNOSIS — F4310 Post-traumatic stress disorder, unspecified: Secondary | ICD-10-CM | POA: Diagnosis not present

## 2021-05-04 DIAGNOSIS — Z20822 Contact with and (suspected) exposure to covid-19: Secondary | ICD-10-CM | POA: Insufficient documentation

## 2021-05-04 DIAGNOSIS — Z79899 Other long term (current) drug therapy: Secondary | ICD-10-CM | POA: Insufficient documentation

## 2021-05-04 DIAGNOSIS — F1020 Alcohol dependence, uncomplicated: Secondary | ICD-10-CM | POA: Diagnosis not present

## 2021-05-04 DIAGNOSIS — F333 Major depressive disorder, recurrent, severe with psychotic symptoms: Secondary | ICD-10-CM | POA: Diagnosis present

## 2021-05-04 DIAGNOSIS — F1420 Cocaine dependence, uncomplicated: Secondary | ICD-10-CM | POA: Insufficient documentation

## 2021-05-04 DIAGNOSIS — Z7984 Long term (current) use of oral hypoglycemic drugs: Secondary | ICD-10-CM | POA: Insufficient documentation

## 2021-05-04 LAB — COMPREHENSIVE METABOLIC PANEL
ALT: 17 U/L (ref 0–44)
AST: 29 U/L (ref 15–41)
Albumin: 3.4 g/dL — ABNORMAL LOW (ref 3.5–5.0)
Alkaline Phosphatase: 71 U/L (ref 38–126)
Anion gap: 8 (ref 5–15)
BUN: 14 mg/dL (ref 8–23)
CO2: 27 mmol/L (ref 22–32)
Calcium: 9 mg/dL (ref 8.9–10.3)
Chloride: 104 mmol/L (ref 98–111)
Creatinine, Ser: 1.15 mg/dL (ref 0.61–1.24)
GFR, Estimated: >60 mL/min (ref >60)
Glucose, Bld: 173 mg/dL — ABNORMAL HIGH (ref 70–99)
Potassium: 3.8 mmol/L (ref 3.5–5.1)
Sodium: 139 mmol/L (ref 135–145)
Total Bilirubin: 0.7 mg/dL (ref 0.3–1.2)
Total Protein: 6.4 g/dL — ABNORMAL LOW (ref 6.5–8.1)

## 2021-05-04 LAB — RESP PANEL BY RT-PCR (FLU A&B, COVID) ARPGX2
Influenza A by PCR: NEGATIVE
Influenza B by PCR: NEGATIVE
SARS Coronavirus 2 by RT PCR: NEGATIVE

## 2021-05-04 LAB — ACETAMINOPHEN LEVEL: Acetaminophen (Tylenol), Serum: <10 ug/mL — ABNORMAL LOW (ref 10–30)

## 2021-05-04 LAB — CBC
HCT: 42.2 % (ref 39.0–52.0)
Hemoglobin: 14.2 g/dL (ref 13.0–17.0)
MCH: 29.6 pg (ref 26.0–34.0)
MCHC: 33.6 g/dL (ref 30.0–36.0)
MCV: 87.9 fL (ref 80.0–100.0)
Platelets: 173 10*3/uL (ref 150–400)
RBC: 4.8 MIL/uL (ref 4.22–5.81)
RDW: 13.5 % (ref 11.5–15.5)
WBC: 4 10*3/uL (ref 4.0–10.5)
nRBC: 0 % (ref 0.0–0.2)

## 2021-05-04 LAB — RAPID URINE DRUG SCREEN, HOSP PERFORMED
Amphetamines: NOT DETECTED
Barbiturates: NOT DETECTED
Benzodiazepines: NOT DETECTED
Cocaine: POSITIVE — AB
Opiates: NOT DETECTED
Tetrahydrocannabinol: NOT DETECTED

## 2021-05-04 LAB — ETHANOL: Alcohol, Ethyl (B): <10 mg/dL (ref <10)

## 2021-05-04 LAB — SALICYLATE LEVEL: Salicylate Lvl: <7 mg/dL — ABNORMAL LOW (ref 7.0–30.0)

## 2021-05-04 NOTE — ED Provider Notes (Signed)
?[facility] ?Provider Note ? ? ?CSN: [id number] ?Arrival date & time: 05/04/21  0443 ? ?  ? ?History ? ?Chief Complaint  ?Patient presents with  ? Suicidal  ? ? ?[name] is a 66 y.o. male. ? ?HPI ? ?Patient is a 66 year old male with medical history notable for unstable housing, tobacco use, cocaine use, major depressive disorder with psychotic features, diabetes presenting today with suicidal ideations.  He has been feeling like this for some time, he has a plan to throw himself in front of moving traffic or to "swallow something toxic".  He has been seen for this in the past, states he was prescribed antidepressants but he takes them "off-and-on".  Denies any pain other than on the bottom of his feet bilaterally. ? ?Home Medications ?Prior to Admission medications   ?Medication Sig Start Date End Date Taking? Authorizing Provider  ?albuterol (VENTOLIN HFA) 108 (90 Base) MCG/ACT inhaler Inhale 1-2 puffs into the lungs every 6 (six) hours as needed for wheezing or shortness of breath. ?Patient not taking: Reported on 05/04/2021 12/16/20 03/29/21  Maida Sale, MD  ?ARIPiprazole (ABILIFY) 15 MG tablet Take 0.5 tablets (7.5 mg total) by mouth daily. For mood control ?Patient not taking: Reported on 05/04/2021 04/02/21   Lindell Spar I, NP  ?gabapentin (NEURONTIN) 100 MG capsule Take 1 capsule (100 mg total) by mouth 3 (three) times daily. For anxiety/neuropathy ?Patient not taking: Reported on 05/04/2021 04/01/21 05/01/21  Lindell Spar I, NP  ?hydrOXYzine (ATARAX) 25 MG tablet Take 1 tablet (25 mg total) by mouth 3 (three) times daily as needed for anxiety. ?Patient not taking: Reported on 05/04/2021 04/01/21   Lindell Spar I, NP  ?metFORMIN (FORTAMET) 1000 MG (OSM) 24 hr tablet Take 1 tablet (1,000 mg total) by mouth daily with supper. For diabetes ?Patient not taking: Reported on 05/04/2021 04/01/21   Lindell Spar I, NP  ?metoprolol succinate (TOPROL-XL) 25 MG 24 hr tablet  Take 1 tablet (25 mg total) by mouth daily. For heart condition ?Patient not taking: Reported on 05/04/2021 04/02/21   Lindell Spar I, NP  ?nicotine (NICODERM CQ - DOSED IN MG/24 HOURS) 21 mg/24hr patch Place 1 patch (21 mg total) onto the skin daily. ?Patient not taking: Reported on 03/29/2021 03/12/21   Merrily Brittle, DO  ?rosuvastatin (CRESTOR) 40 MG tablet Take 1 tablet (40 mg total) by mouth daily. For high cholesterol ?Patient not taking: Reported on 05/04/2021 04/02/21   Lindell Spar I, NP  ?sertraline (ZOLOFT) 50 MG tablet Take 1 tablet (50 mg total) by mouth daily. For depression ?Patient not taking: Reported on 05/04/2021 04/02/21   Lindell Spar I, NP  ?traZODone (DESYREL) 50 MG tablet Take 1 tablet (50 mg total) by mouth at bedtime as needed for sleep. ?Patient not taking: Reported on 05/04/2021 04/01/21   Lindell Spar I, NP  ?   ? ?Allergies    ?Chlorpromazine   ? ?Review of Systems   ?Review of Systems ? ?Physical Exam ?Updated Vital Signs ?BP (!) 152/85 (BP Location: Right Arm)   Pulse 91   Temp 98.1 ?F (36.7 ?C) (Oral)   Resp 17   SpO2 100%  ?Physical Exam ?Vitals and nursing note reviewed.  ?Constitutional:   ?   General: He is not in acute distress. ?   Appearance: He is well-developed.  ?HENT:  ?   Head: Normocephalic and atraumatic.  ?Eyes:  ?   Conjunctiva/sclera: Conjunctivae normal.  ?Cardiovascular:  ?   Rate and  Rhythm: Normal rate and regular rhythm.  ?   Heart sounds: No murmur heard. ?Pulmonary:  ?   Effort: Pulmonary effort is normal. No respiratory distress.  ?   Breath sounds: Normal breath sounds.  ?Abdominal:  ?   Palpations: Abdomen is soft.  ?   Tenderness: There is no abdominal tenderness.  ?Musculoskeletal:     ?   General: No swelling.  ?   Cervical back: Neck supple.  ?Skin: ?   General: Skin is warm and dry.  ?   Capillary Refill: Capillary refill takes less than 2 seconds.  ?   Comments: No pressure ulcers noted to the soles of the feet bilaterally  ?Neurological:  ?   Mental  Status: He is alert.  ?Psychiatric:     ?   Mood and Affect: Mood normal.  ? ? ?ED Results / Procedures / Treatments   ?Labs ?(all labs ordered are listed, but only abnormal results are displayed) ?Labs Reviewed  ?COMPREHENSIVE METABOLIC PANEL - Abnormal; Notable for the following components:  ?    Result Value  ? Glucose, Bld 173 (*)   ? Total Protein 6.4 (*)   ? Albumin 3.4 (*)   ? All other components within normal limits  ?SALICYLATE LEVEL - Abnormal; Notable for the following components:  ? Salicylate Lvl <1.6 (*)   ? All other components within normal limits  ?ACETAMINOPHEN LEVEL - Abnormal; Notable for the following components:  ? Acetaminophen (Tylenol), Serum <10 (*)   ? All other components within normal limits  ?RAPID URINE DRUG SCREEN, HOSP PERFORMED - Abnormal; Notable for the following components:  ? Cocaine POSITIVE (*)   ? All other components within normal limits  ?RESP PANEL BY RT-PCR (FLU A&B, COVID) [id number]  ?ETHANOL  ?CBC  ? ? ?EKG ?None ? ?Radiology ?No results found. ? ?Procedures ?Procedures  ? ? ?Medications Ordered in ED ?Medications - No data to display ? ?ED Course/ Medical Decision Making/ A&P ?  ?                        ?Medical Decision Making ?Amount and/or Complexity of Data Reviewed ?Labs: ordered. ? ? ?This patient presents to the ED for concern of suicidal ideations, this involves an extensive number of treatment options, and is a complaint that carries with it a high risk of complications and morbidity.   ? ?Patient is affected by unstable housing*social determinants of health.  He has complicated medical history but none of these appear to be directly contributing his suicidal ideations at this time. ? ? ?Additional history obtained:  ? ?I reviewed patient's chart, documented comorbidities as detailed above in the HPI ? ?  ?Lab Tests: ? ?I ordered, viewed, and personally interpreted labs.  The pertinent results include:   ?UDS is notable for cocaine use.  Patient states he last  used 1 day previous.  There is no leukocytosis underlying anemia, ethanol, salicylate and acetaminophen all within normal limits.  No AKI or LFT derangement noted on CMP.   ? ?Medicines ordered and prescription drug management: ? ?I ordered medication including: Meds reconciliation is pending ? ?I have reviewed the patients home medicines and have made adjustments as needed ? ? ?Test Considered: ? ?Considered IVC but patient is calm and cooperative, do not feel IVC indicated in his current presentation. ? ?  ?Consultations Obtained: ? ?I requested consultation with the TTS.  Discussed lab and imaging findings as well as pertinent  plan - they recommend: Evaluation pending ? ? ?Reevaluation: ? ?After the interventions noted above, I reevaluated the patient and found patient is well-appearing and requesting breakfast ? ? ?Problems addressed / ED Course: ?66 year old male presenting due to suicidal ideations.  Work-up is as documented above.  At this time I feel he is medically appropriate and cleared for psychiatric evaluation by TTS. ?  ?Social Determinants of Health: ?Unstable housing ?  ?Disposition: ? ? ?After consideration of the diagnostic results and the patients response to treatment, I feel that the patent would benefit from TTS evaluation. ? ?  ? ? ? ? ? ? ? ? ?Final Clinical Impression(s) / ED Diagnoses ?Final diagnoses:  ?None  ? ? ?Rx / DC Orders ?ED Discharge Orders   ? ? None  ? ?  ? ? ?  ?Sherrill Raring, PA-C ?05/04/21 0802 ? ?  ?Sherwood Gambler, MD ?05/04/21 [phone] ? ?

## 2021-05-04 NOTE — ED Notes (Signed)
All belongings inventoried and placed in locker #10 x 2 bags.  ?

## 2021-05-04 NOTE — ED Notes (Signed)
Patient states  for the last several days he has been thinking about jumping off a bridge, states he has been homeless since August, and his down on his luck.  ?

## 2021-05-04 NOTE — ED Triage Notes (Signed)
Pt endorses suicidal ideation, states he has been thinking of jumping off a bridge for some time now. Pt states that he is also having a problem with his feet d/t being homeless and having to walk for significant amounts of time.  ?
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
## 2021-05-05 DIAGNOSIS — F333 Major depressive disorder, recurrent, severe with psychotic symptoms: Secondary | ICD-10-CM | POA: Diagnosis not present

## 2021-05-05 DIAGNOSIS — R45851 Suicidal ideations: Secondary | ICD-10-CM | POA: Diagnosis not present

## 2021-05-05 DIAGNOSIS — F1424 Cocaine dependence with cocaine-induced mood disorder: Secondary | ICD-10-CM | POA: Diagnosis not present

## 2021-05-05 DIAGNOSIS — R440 Auditory hallucinations: Secondary | ICD-10-CM | POA: Diagnosis not present

## 2021-05-05 DIAGNOSIS — F32A Depression, unspecified: Secondary | ICD-10-CM | POA: Diagnosis not present

## 2021-05-05 NOTE — Discharge Instructions (Addendum)
For your behavioral health needs you are advised to continue treatment with the [facility]: ? ?     [place] ?     [place] ?     [place], [place] [address] ?     [phone]  ?

## 2021-05-05 NOTE — ED Provider Notes (Signed)
1:11 PM ?Pt seen by TTS and recommendations for discharge back to [facility]. Patient is alert and cooperative at this time and stable for discharge.  ?  ?Lianne Cure, DO ?44/45/84 1312 ? ?

## 2021-05-05 NOTE — ED Notes (Signed)
[facility] providers at bedside.  ?
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
## 2021-05-05 NOTE — ED Notes (Signed)
Pt verbalizes understanding of discharge instructions. Opportunity for questions and answers were provided. Pt discharged from the ED.   ?

## 2021-05-05 NOTE — ED Notes (Signed)
Placed breakfast order ?

## 2021-05-05 NOTE — BH Assessment (Signed)
[facility] Assessment Progress Note ? ?Per Hampton Abbot, MD, this pt does not require psychiatric hospitalization at this time.  Pt is psychiatrically cleared.  Discharge instructions advise pt to continue treatment with the [facility].  Pt's nurse, [place], has been notified. ? ?Jalene Mullet, MA ?Triage Specialist ?[phone]  ?

## 2021-05-05 NOTE — ED Notes (Signed)
Pt resting comfortably at this time. Visible rise and fall of chest noted. Pt appears in NAD at this time.  ?

## 2021-05-06 DIAGNOSIS — R630 Anorexia: Secondary | ICD-10-CM | POA: Diagnosis not present

## 2021-05-06 DIAGNOSIS — Z20822 Contact with and (suspected) exposure to covid-19: Secondary | ICD-10-CM | POA: Diagnosis not present

## 2021-05-06 DIAGNOSIS — F319 Bipolar disorder, unspecified: Secondary | ICD-10-CM | POA: Diagnosis not present

## 2021-05-06 DIAGNOSIS — F314 Bipolar disorder, current episode depressed, severe, without psychotic features: Secondary | ICD-10-CM | POA: Diagnosis not present

## 2021-05-06 DIAGNOSIS — F418 Other specified anxiety disorders: Secondary | ICD-10-CM | POA: Diagnosis not present

## 2021-05-06 DIAGNOSIS — R440 Auditory hallucinations: Secondary | ICD-10-CM | POA: Diagnosis not present

## 2021-05-06 DIAGNOSIS — R45851 Suicidal ideations: Secondary | ICD-10-CM | POA: Diagnosis not present

## 2021-05-07 DIAGNOSIS — R45851 Suicidal ideations: Secondary | ICD-10-CM | POA: Diagnosis not present

## 2021-05-07 DIAGNOSIS — F314 Bipolar disorder, current episode depressed, severe, without psychotic features: Secondary | ICD-10-CM | POA: Diagnosis not present

## 2021-05-12 DIAGNOSIS — F3181 Bipolar II disorder: Secondary | ICD-10-CM | POA: Diagnosis not present

## 2021-05-22 IMAGING — US US THORACENTESIS ASP PLEURAL SPACE W/IMG GUIDE
3 series · 4 of 4 positions shown · non-contrast
Comparison: none

INDICATION: Patient with history of new onset systolic HF, dyspnea, and right
pleural effusion. Request is made for diagnostic and therapeutic
right thoracentesis.

[Series 1: us thoracentesis asp pleural space w/img guide · 2 of 2 slices shown]
[im 1/2]
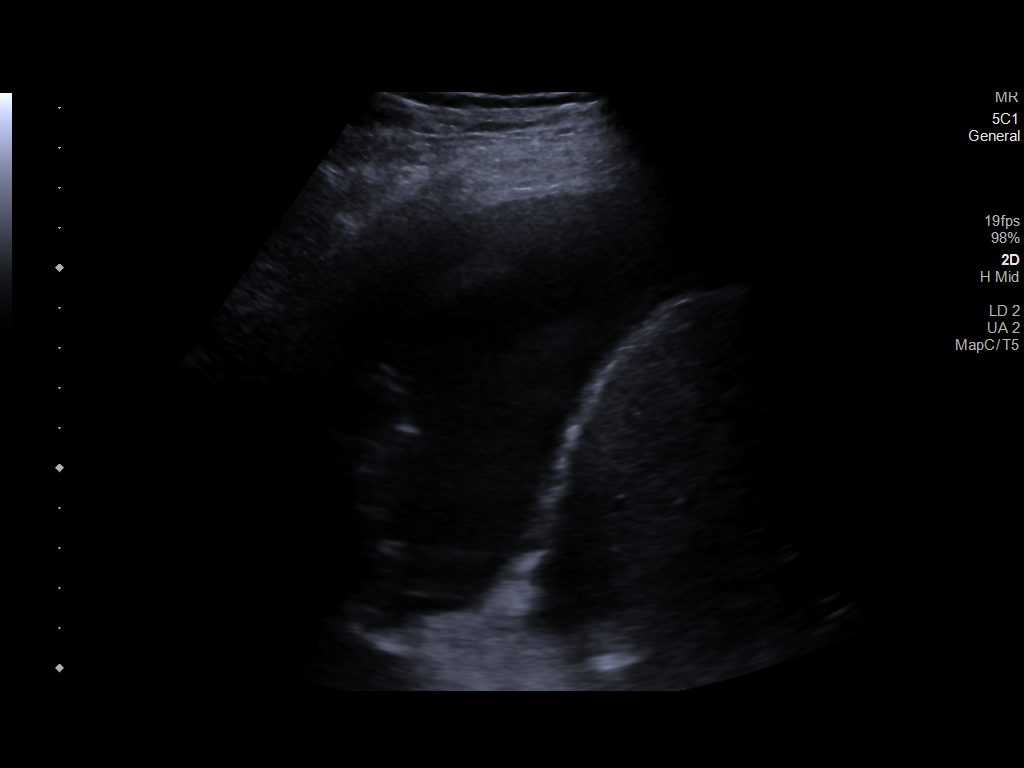
[im 2/2]
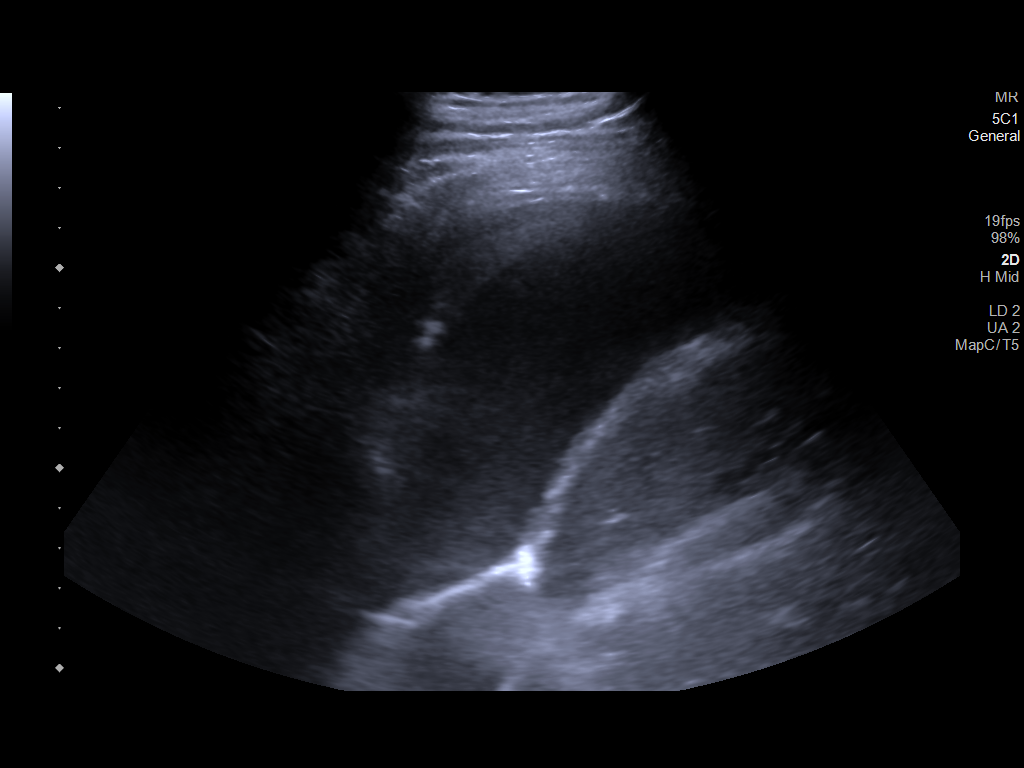

[Series 1001: general · 1 of 1 slices shown (1 of 2)]
[im 1/1]
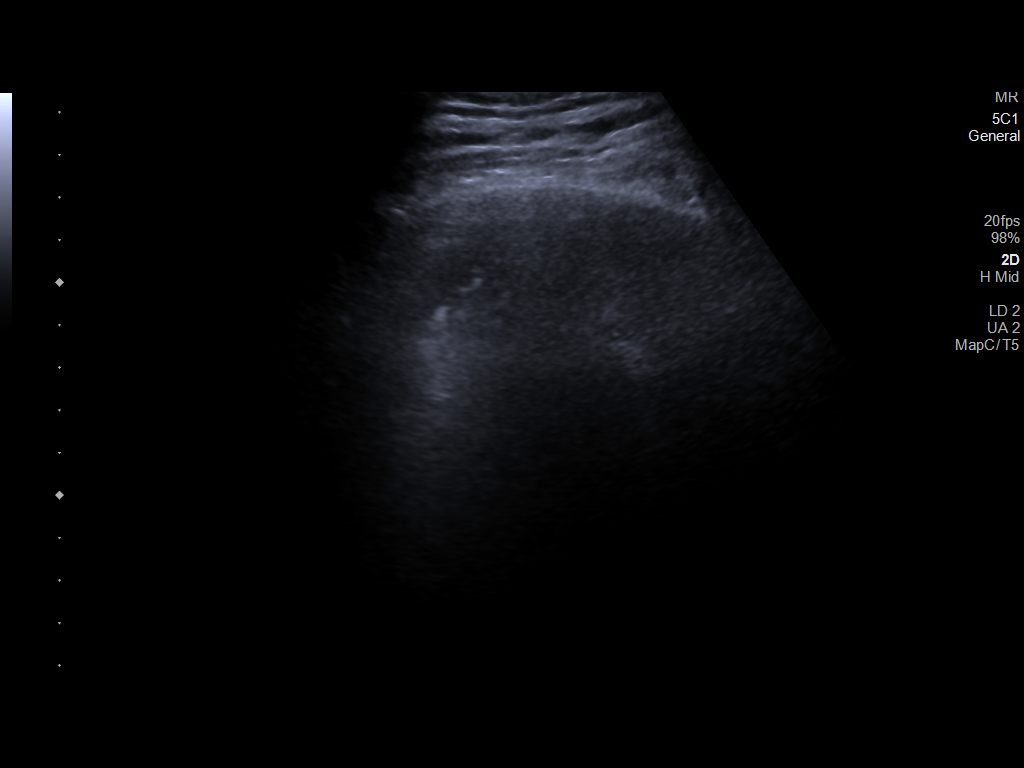

[Series 1002: general · 1 of 1 slices shown (2 of 2)]
[im 1/1]
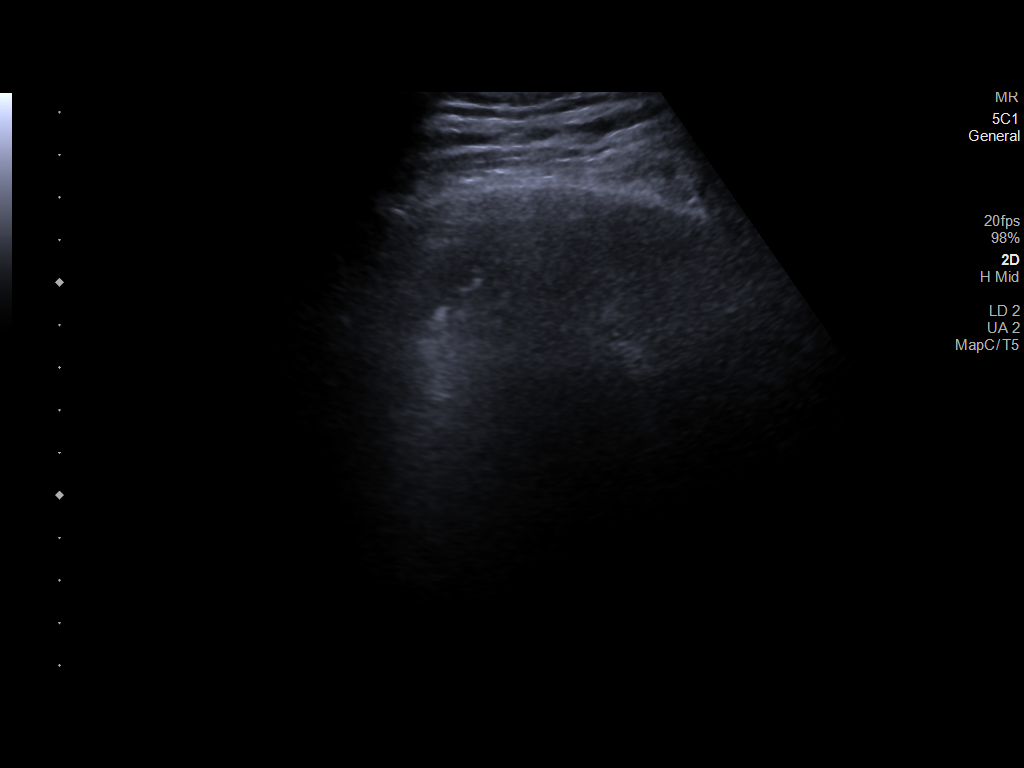

[4 of 4 positions shown; findings below may reference images not displayed]

EXAM:
ULTRASOUND GUIDED DIAGNOSTIC AND THERAPEUTIC RIGHT THORACENTESIS

MEDICATIONS:
8 mL 1% lidocaine

COMPLICATIONS:
None immediate.

PROCEDURE:
An ultrasound guided thoracentesis was thoroughly discussed with the
patient and questions answered. The benefits, risks, alternatives
and complications were also discussed. The patient understands and
wishes to proceed with the procedure. Written consent was obtained.

Ultrasound was performed to localize and mark an adequate pocket of
fluid in the right chest. The area was then prepped and draped in
the normal sterile fashion. 1% Lidocaine was used for local
anesthesia. Under ultrasound guidance a 6 Fr Safe-T-Centesis
catheter was introduced by Dr. Cailin. Thoracentesis was performed.
The catheter was removed and a dressing applied.
FINDINGS: A total of approximately 750 mL of hazy gold fluid was removed.
Samples were sent to the laboratory as requested by the clinical
team.
IMPRESSION: Successful ultrasound guided right thoracentesis yielding 750 mL of
pleural fluid.

## 2022-04-29 IMAGING — DX DG CHEST 2V
2 series · 2 of 2 positions shown · non-contrast
Comparison: Chest radiograph 01/13/2020

CLINICAL DATA: Cough and shortness of breath for 3 days.

EXAM:
CHEST - 2 VIEW

[chest pa]
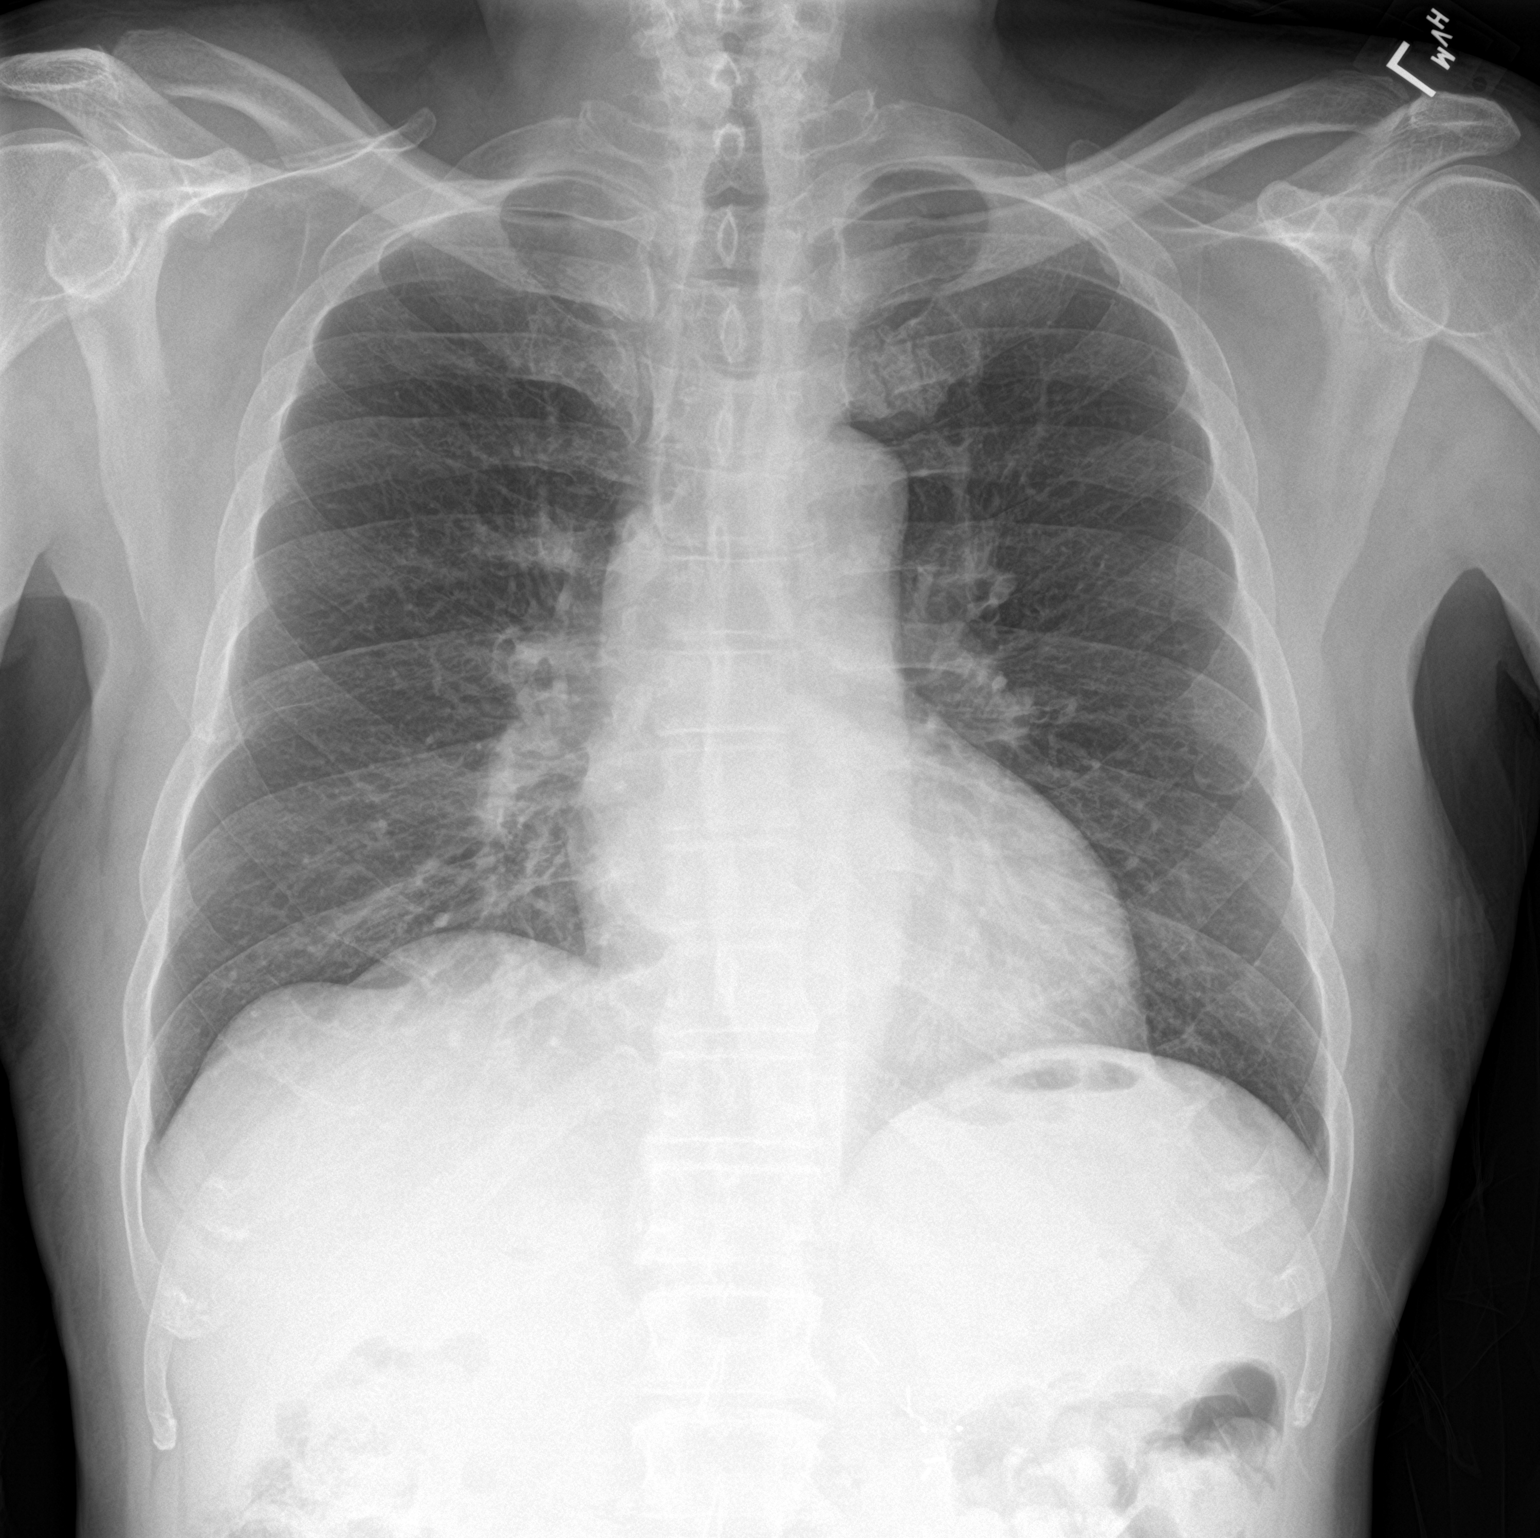

[chest lat]
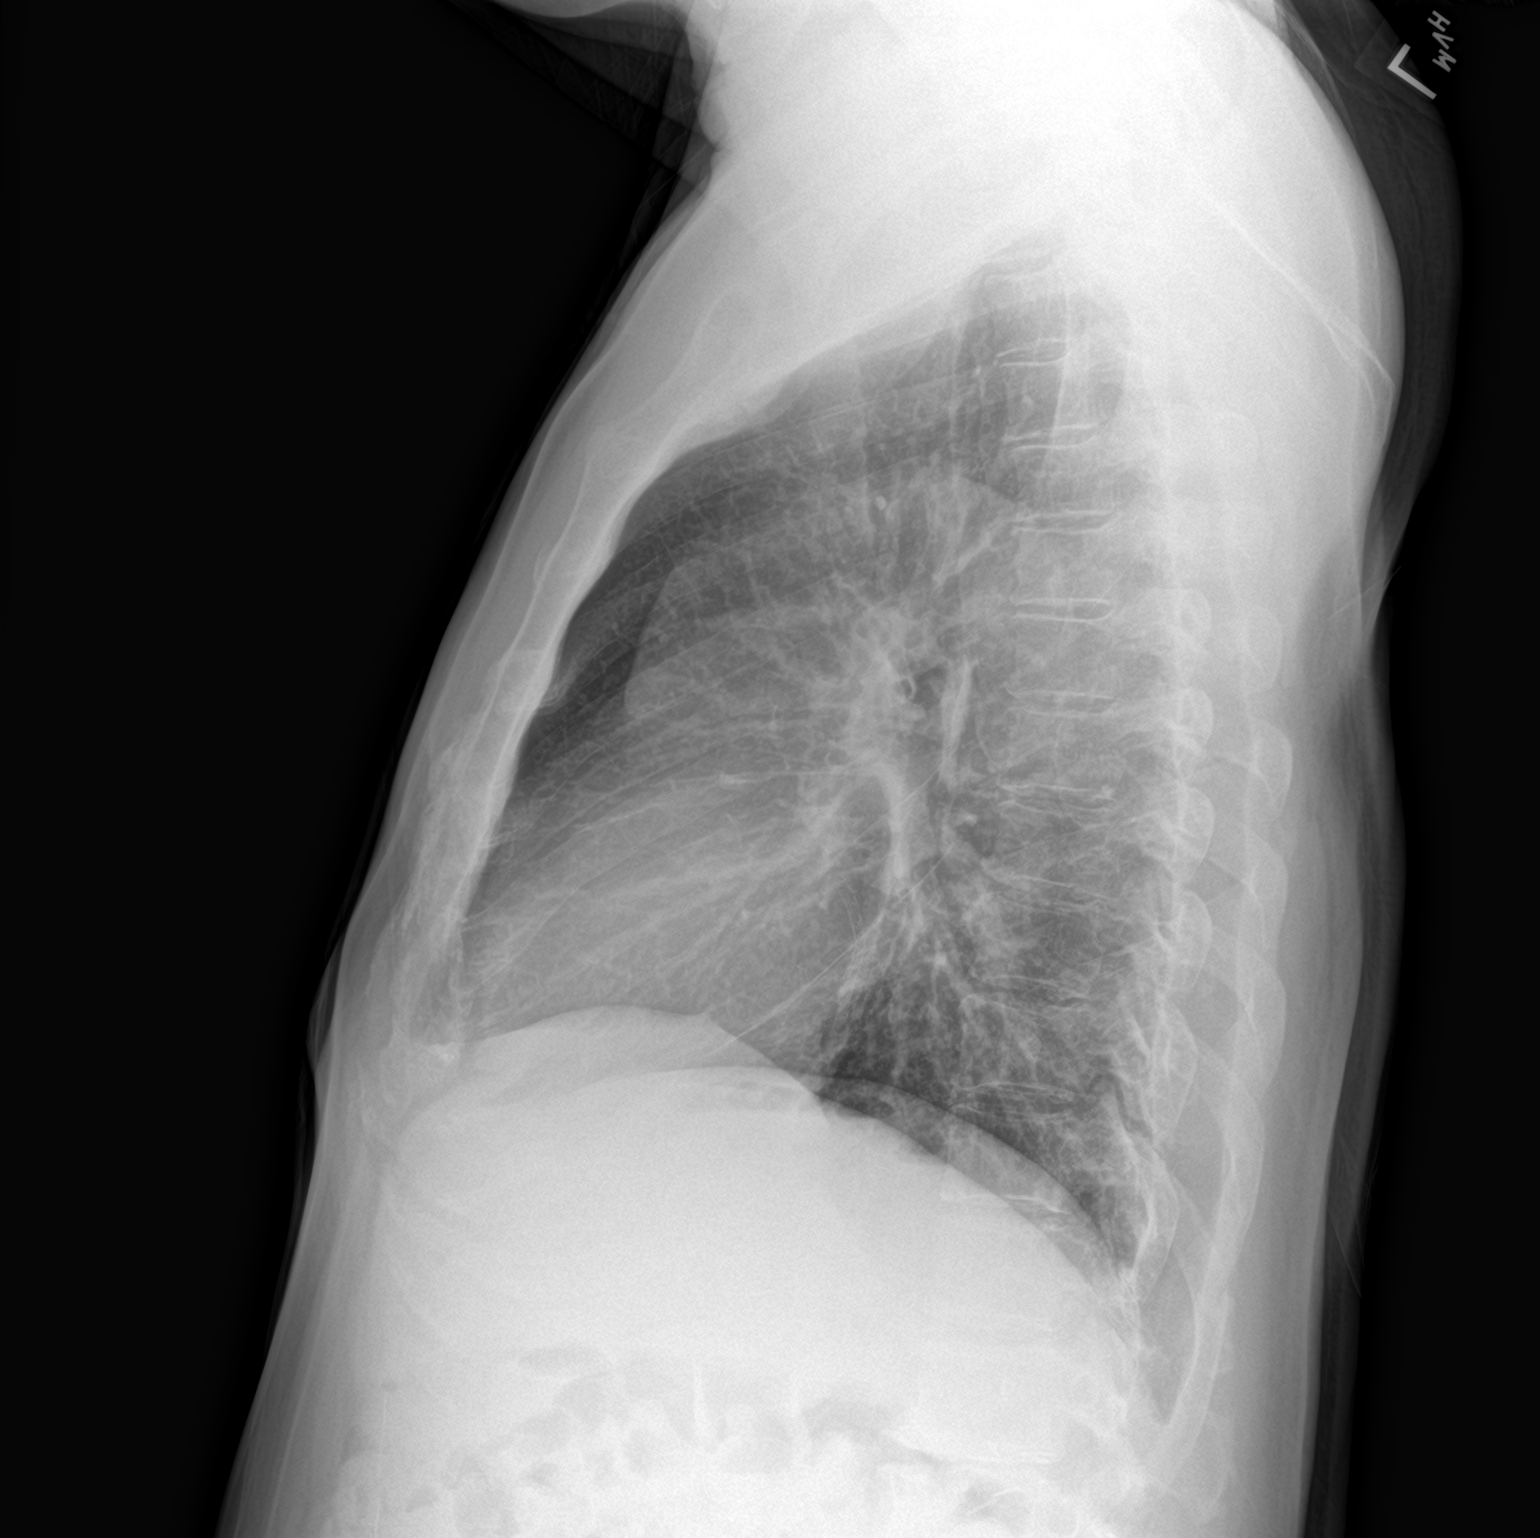

[2 of 2 positions shown; findings below may reference images not displayed]

FINDINGS: Heart size normal, resolved cardiomegaly from prior exam. Normal
mediastinal contours. Mild central bronchial thickening. No
pulmonary edema. No focal airspace disease, pleural effusion, or
pneumothorax. No acute osseous abnormalities are seen.
IMPRESSION: Mild central bronchial thickening.
